# Patient Record
Sex: Female | Born: 1960
Health system: Southern US, Community
[De-identification: ages and names within clinical notes are randomized; demographics above are authoritative.]

## PROBLEM LIST (undated history)

## (undated) DIAGNOSIS — M48061 Spinal stenosis, lumbar region without neurogenic claudication: Secondary | ICD-10-CM

## (undated) DIAGNOSIS — N390 Urinary tract infection, site not specified: Principal | ICD-10-CM

## (undated) DIAGNOSIS — D6959 Other secondary thrombocytopenia: Secondary | ICD-10-CM

## (undated) DIAGNOSIS — Z9109 Other allergy status, other than to drugs and biological substances: Secondary | ICD-10-CM

## (undated) DIAGNOSIS — G894 Chronic pain syndrome: Secondary | ICD-10-CM

## (undated) DIAGNOSIS — R51 Headache: Secondary | ICD-10-CM

## (undated) DIAGNOSIS — M5126 Other intervertebral disc displacement, lumbar region: Secondary | ICD-10-CM

## (undated) DIAGNOSIS — K219 Gastro-esophageal reflux disease without esophagitis: Secondary | ICD-10-CM

## (undated) DIAGNOSIS — F419 Anxiety disorder, unspecified: Secondary | ICD-10-CM

## (undated) DIAGNOSIS — Z923 Personal history of irradiation: Secondary | ICD-10-CM

## (undated) DIAGNOSIS — K0530 Chronic periodontitis, unspecified: Secondary | ICD-10-CM

## (undated) DIAGNOSIS — D701 Agranulocytosis secondary to cancer chemotherapy: Secondary | ICD-10-CM

## (undated) DIAGNOSIS — M5020 Other cervical disc displacement, unspecified cervical region: Secondary | ICD-10-CM

## (undated) DIAGNOSIS — C539 Malignant neoplasm of cervix uteri, unspecified: Secondary | ICD-10-CM

## (undated) DIAGNOSIS — D61810 Antineoplastic chemotherapy induced pancytopenia: Secondary | ICD-10-CM

## (undated) DIAGNOSIS — R519 Headache, unspecified: Secondary | ICD-10-CM

## (undated) DIAGNOSIS — E876 Hypokalemia: Secondary | ICD-10-CM

## (undated) DIAGNOSIS — I10 Essential (primary) hypertension: Secondary | ICD-10-CM

## (undated) DIAGNOSIS — T451X5A Adverse effect of antineoplastic and immunosuppressive drugs, initial encounter: Secondary | ICD-10-CM

## (undated) HISTORY — DX: Personal history of irradiation: Z92.3

## (undated) HISTORY — PX: CARPAL TUNNEL RELEASE: SHX101

## (undated) MED FILL — MAVYRET 100-40 MG TABLETS (SRX): 100-40 MG | 28 days supply | Qty: 84 | Fill #0 | Status: AC

## (undated) MED FILL — MAVYRET 100-40 MG TABLETS (SRX): 100-40 MG | 28 days supply | Qty: 84 | Fill #1 | Status: AC

---

## 1980-03-28 HISTORY — PX: ABCESS DRAINAGE: SHX399

## 2006-06-07 ENCOUNTER — Emergency Department (HOSPITAL_COMMUNITY): Admission: EM | Admit: 2006-06-07 | Discharge: 2006-06-07 | Payer: Self-pay | Admitting: Emergency Medicine

## 2008-03-28 HISTORY — PX: CERVICAL DISC SURGERY: SHX588

## 2012-12-31 LAB — AMB POC DRUG SCREEN (G0434): METHADONE UR POC: POSITIVE

## 2012-12-31 NOTE — Progress Notes (Signed)
New patient. Opioid agreement reviewed with patient and a signed copy has been provided to her. Preliminary UDS (+) methadone. (-) on PMP. Admits to taking a friends methadone and took a dose this morning.   Milinda Cave, LPN  HISTORY OF PRESENT ILLNESS  Erica Zamora is a 52 y.o. female.  HPI Comments: New patient. Referred by Dr. Derrell Lolling.  Initial visit survey reviewed. On the pain diagram, the patient indicates neck pain, posterior shoulder pain. Low back pain.  The least amount of pain, 4/10. Greatest-10. Average-5.  I reviewed the list of medications which includes Percocet, methadone. The patient admits that she was discharged from a recent pain clinic, Dr. Ananias Pilgrim.it is my understanding that she had a urine positive for methadone, they were not prescribing that medication for her. I believe that many months ago that pain clinic had been prescribing Percocet for the patient. She was discharged from the clinic. She reports that she continues to use methadone, purchasing it from a friend.  She reports that methadone does help her pain and she is hopeful that a pain clinic will prescribe this for her.  -chief complaint, and neck pain. She does also have some low back pain. The most significant pain is in the neck pain and today's visit we'll concentrate on her neck pain.  She reports a history of neck surgery several years ago. She reports that in neck pain is deep, achy, sharp. Neck pain for several years. Intermittent symptoms into her left arm, down to the forearm. Neck pain increases with certain neck movements. Pain is present during the day and night, almost constant.  She reports no improvement with muscular trigger point injections for her neck pain. I do not believe she has had cervical spine injections.  She has not worked with physical therapy, she states due to cost.  She has tried Percocet, gabapentin, Flexeril, Vicodin, methadone, tramadol, nonsteroidal anti-inflammatory drugs.she tried  Cymbalta, samples, reports side effects.  She use of methadone this morning.  She reports a history of anxiety and has been on medication for this in the past.  She has been under the care of psychiatry/psychology and reports she plans on setting up a followup.  -she reports, use of a Percocet yesterday. She states the medication the previous pain clinic was prescribing was not strong enough.              -radiologist's impression, MRI of the cervical spine, in May of this year: Status post ACDF at C5-6 with satisfactory overall appearance. Increasing disc degenerative changes C4-5.  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 Further testing not performed secondary  To complaints of pain.  Prescription monitoring program reviewed.--6 different prescribers. 5 different pharmacies. Several prescriptions for hydrocodone and Percocet.  The patient was educated on the Tylenol component of certain pain medications. The patient was reminded, they should keep track of total Tylenol intakeand make sure liver function tests have been checked with primary care physician.    -opioid risk tool has been reviewed, and will be scanned. Total score--1    -The available medical record/information has been reviewed including notes from the referring physician's office. Medical information available for review includes records from the [facility] neurosurgery clinic and progress notes from Dr. Ananias Pilgrim.  Neurosurgery clinic saw the patient fairly recently and did not feel further surgery was indicated.  She has received trigger point injections and I believe others were not performed because she was complaining of  pain from the trigger point injections. Medical information indicates there have been complaints of side effects or itching from Tylenol with codeine, fentanyl patch, tramadol.  Medical information indicates there have been prescriptions for gabapentin, lorazepam, Ambien, Cymbalta,butrans  patch.  The previous pain clinic also provided the following statement in a progress note-"I won't give narcotics to cover her emotional suffering, poor coping and unreliability."    -New patient survey reviewed and will be scanned.Please see this form for more information concerning PMH,FH,SH, and ROS.  The majority of today's visit was spent counseling and coordinating care.Total visit time was greater than 60 minutes.        - Preliminary urine screen reviewed.       - Additional time was spent reviewing medical records,interpreting data and educating the patient.                              -Discussing Dxes,condition and treatment options,possible side effects/risks/complications.       Review of Systems   Constitutional: Positive for weight loss and malaise/fatigue.   HENT: Positive for neck pain. Negative for sore throat.    Eyes: Negative for blurred vision and double vision.   Respiratory: Negative for cough and shortness of breath.    Cardiovascular: Negative for chest pain and leg swelling.   Gastrointestinal: Positive for heartburn. Negative for nausea.   Genitourinary: Negative for dysuria and urgency.   Musculoskeletal: Positive for back pain and joint pain.   Skin: Negative for itching and rash.   Neurological: Positive for dizziness and headaches. Negative for seizures.   Endo/Heme/Allergies: Negative for environmental allergies. Does not bruise/bleed easily.   Psychiatric/Behavioral: Positive for depression. Negative for suicidal ideas. The patient is nervous/anxious and has insomnia.        Physical Exam   Nursing note and vitals reviewed.  Constitutional: She appears well-developed and well-nourished. She is cooperative. She does not have a sickly appearance.   HENT:   Head: Normocephalic and atraumatic.   Right Ear: External ear normal. No drainage.   Left Ear: External ear normal. No drainage.   Nose: Nose normal.   Mouth/Throat: No oropharyngeal exudate.   Eyes: Lids are normal. Right eye  exhibits no discharge. Left eye exhibits no discharge. Right conjunctiva has no hemorrhage. Left conjunctiva has no hemorrhage. Pupils are equal.   Neck: Neck supple. No tracheal deviation present. No mass present.   Cardiovascular: Normal rate and regular rhythm.    No murmur heard.  Pulmonary/Chest: Effort normal and breath sounds normal. No respiratory distress.   Musculoskeletal:        Cervical back: She exhibits decreased range of motion and tenderness. She exhibits no spasm.   Neurological: She is alert. She has normal reflexes. No cranial nerve deficit.   Slow, stiff gait. Grossly normal strength, light touch sensation, deep tendon reflexes upper extremities. There are triggerpoints to cervical paraspinal muscles.   Skin: Skin is intact. No abrasion and no rash noted. She is not diaphoretic. No cyanosis.   Psychiatric: Her speech is normal and behavior is normal. Judgment and thought content normal. Her mood appears anxious. Her affect is not angry. She does not express inappropriate judgment. She does not exhibit a depressed mood.       ASSESSMENT and PLAN  Encounter Diagnoses   Name Primary?   ??? Neck pain Yes   ??? Encounter for long-term (current) use of other medications    ???  Chronic pain syndrome    ??? GAD (generalized anxiety disorder)    ??? DJD (degenerative joint disease), cervical    ??? History of cervical spinal surgery    followup in one month  At this time I am not comfortable prescribing any medication or treatment for the patient. At this time I am not comfortable  Taking over direct patient care.  I have encouraged the patient to stop using pain medication not prescribed to her, such as purchasing pain medication from friends.  I recommend that she stop this practice and return in a month. Consider taking over her care including prescriptions if her urine is clean.  I have encouraged the patient to followup with psychiatry.  If she does become involved with our clinic then I would recommend  retrying trigger point injections. I would recommend physical therapy, she states she is never worked with physical therapy.  -Pain is a complex sensory and emotional experience intimately related to the concept of suffering and the life experiences and psychological makeup of the individual. There is a host of psychosocial issues,including depression,anxiety,fear,altered social roles,and loss of activities which have a strong scientific basis for contributing to the chronic pain state. The effective treatment of chronic pain involves understanding the individual with pain. Issues of fear avoidance,catastrophizing,belief systems about exercise and injury,sleep disturbance,family dynamics,and other factors may play a role in the patient's experience of pain.

## 2013-05-14 ENCOUNTER — Emergency Department (HOSPITAL_COMMUNITY)
Admission: EM | Admit: 2013-05-14 | Discharge: 2013-05-14 | Disposition: A | Discharge status: Home / Self Care | Payer: Self-pay | Attending: Emergency Medicine | Admitting: Emergency Medicine

## 2013-05-14 ENCOUNTER — Encounter (HOSPITAL_COMMUNITY): Payer: Self-pay | Admitting: Emergency Medicine

## 2013-05-14 DIAGNOSIS — K029 Dental caries, unspecified: Secondary | ICD-10-CM | POA: Insufficient documentation

## 2013-05-14 DIAGNOSIS — F17200 Nicotine dependence, unspecified, uncomplicated: Secondary | ICD-10-CM | POA: Insufficient documentation

## 2013-05-14 DIAGNOSIS — Z79899 Other long term (current) drug therapy: Secondary | ICD-10-CM | POA: Insufficient documentation

## 2013-05-14 DIAGNOSIS — K219 Gastro-esophageal reflux disease without esophagitis: Secondary | ICD-10-CM | POA: Insufficient documentation

## 2013-05-14 DIAGNOSIS — K089 Disorder of teeth and supporting structures, unspecified: Secondary | ICD-10-CM | POA: Insufficient documentation

## 2013-05-14 DIAGNOSIS — I10 Essential (primary) hypertension: Secondary | ICD-10-CM | POA: Insufficient documentation

## 2013-05-14 DIAGNOSIS — K0889 Other specified disorders of teeth and supporting structures: Secondary | ICD-10-CM

## 2013-05-14 HISTORY — DX: Essential (primary) hypertension: I10

## 2013-05-14 MED ORDER — OXYCODONE-ACETAMINOPHEN 5-325 MG PO TABS
2.0000 | ORAL_TABLET | Freq: Once | ORAL | Status: AC
  Administered 2013-05-14: 2 via ORAL
  Filled 2013-05-14: qty 2

## 2013-05-14 MED ORDER — OXYCODONE-ACETAMINOPHEN 5-325 MG PO TABS: 1.0000 | ORAL_TABLET | Freq: Four times a day (QID) | ORAL | Status: DC | PRN

## 2013-05-14 MED ORDER — AMOXICILLIN 500 MG PO CAPS: 500.0000 mg | ORAL_CAPSULE | Freq: Three times a day (TID) | ORAL | Status: DC

## 2013-05-14 MED ORDER — AMOXICILLIN 500 MG PO CAPS
500.0000 mg | ORAL_CAPSULE | Freq: Once | ORAL | Status: AC
  Administered 2013-05-14: 500 mg via ORAL
  Filled 2013-05-14: qty 1

## 2013-05-14 NOTE — Discharge Instructions (Signed)

## 2013-05-14 NOTE — ED Provider Notes (Signed)
CSN: [id number]     Arrival date & time 05/14/13  1940 History   First MD Initiated Contact with Patient 05/14/13 1954     Chief Complaint  Patient presents with  . Dental Pain     (Consider location/radiation/quality/duration/timing/severity/associated sxs/prior Treatment) HPI  Patient presents to the emergency department with a dental complaint. Symptoms began this past wednesdsay The patient has tried to alleviate pain with OTC medication.  Pain rated at a 10/10, characterized as throbbing in nature and located left upper molar.. Patient denies fever, night sweats, chills, difficulty swallowing or opening mouth, SOB, nuchal rigidity or decreased ROM of neck.  Patient does not have a dentist and requests a resource guide at discharge.  Was supposed to get tooth taken out a few days ago but she is here from out of town because her daughter is having a baby.  Past Medical History  Diagnosis Date  . Hypertension   . Acid reflux    Past Surgical History  Procedure Laterality Date  . Cervical disc surgery     History reviewed. No pertinent family history. History  Substance Use Topics  . Smoking status: Current Every Day Smoker  . Smokeless tobacco: Not on file  . Alcohol Use: No   OB History   Grav Para Term Preterm Abortions TAB SAB Ect Mult Living                 Review of Systems  The patient denies anorexia, fever, weight loss,, vision loss, decreased hearing, hoarseness, chest pain, syncope, dyspnea on exertion, peripheral edema, balance deficits, hemoptysis, abdominal pain, melena, hematochezia, severe indigestion/heartburn, hematuria, incontinence, genital sores, muscle weakness, suspicious skin lesions, transient blindness, difficulty walking, depression, unusual weight change, abnormal bleeding, enlarged lymph nodes, angioedema, and breast masses.   Allergies  Fentanyl; Ibuprofen; Tramadol; and Tylenol  Home Medications   Current Outpatient Rx  Name  Route  Sig   Dispense  Refill  . Aspirin-Salicylamide-Caffeine (BC HEADACHE POWDER PO)   Oral   Take 1 packet by mouth daily as needed (for pain).         Marland Kitchen lisinopril-hydrochlorothiazide (PRINZIDE,ZESTORETIC) 10-12.5 MG per tablet   Oral   Take 1 tablet by mouth daily.         Marland Kitchen omeprazole (PRILOSEC) 20 MG capsule   Oral   Take 20 mg by mouth daily.         . pseudoephedrine (SUDAFED) 30 MG tablet   Oral   Take 30 mg by mouth every 4 (four) hours as needed for congestion.         Marland Kitchen amoxicillin (AMOXIL) 500 MG capsule   Oral   Take 1 capsule (500 mg total) by mouth 3 (three) times daily.   21 capsule   0   . oxyCODONE-acetaminophen (PERCOCET/ROXICET) 5-325 MG per tablet   Oral   Take 1-2 tablets by mouth every 6 (six) hours as needed for severe pain.   15 tablet   0    BP 146/76  Pulse 88  Temp(Src) 97.6 F (36.4 C)  Resp 18  Ht 5\' 1"  (1.549 m)  Wt 150 lb (68.04 kg)  BMI 28.36 kg/m2  SpO2 100% Physical Exam  Nursing note and vitals reviewed. Constitutional: She appears well-developed and well-nourished. No distress.  HENT:  Head: Normocephalic and atraumatic.  Mouth/Throat: Uvula is midline, oropharynx is clear and moist and mucous membranes are normal. Normal dentition. Dental caries (Pts tooth shows no obvious abscess but moderate to severe tenderness  to palpation of marked tooth) present. No uvula swelling.    Eyes: Pupils are equal, round, and reactive to light.  Neck: Trachea normal, normal range of motion and full passive range of motion without pain. Neck supple.  Cardiovascular: Normal rate, regular rhythm, normal heart sounds and normal pulses.   Pulmonary/Chest: Effort normal and breath sounds normal. No respiratory distress. Chest wall is not dull to percussion. She exhibits no tenderness, no crepitus, no edema, no deformity and no retraction.  Abdominal: Soft. Normal appearance.  Musculoskeletal: Normal range of motion.  Neurological: She is alert. She has  normal strength.  Skin: Skin is warm, dry and intact. She is not diaphoretic.  Psychiatric: She has a normal mood and affect. Her speech is normal. Cognition and memory are normal.    ED Course  Procedures (including critical care time) Labs Review Labs Reviewed - No data to display Imaging Review No results found.  EKG Interpretation   None       MDM   Final diagnoses:  Pain, dental   Offered dental block but declined.  Patient has dental pain. No emergent s/sx's present. Patent airway. No trismus.  Will be given pain medication and antibiotics. I discussed the need to call dentist within 24/48 hours for follow-up. Dental referral given. Return to ED precautions given.  Pt voiced understanding and has agreed to follow-up.   53 y.o.Valerie Kerr's evaluation in the Emergency Department is complete. It has been determined that no acute conditions requiring further emergency intervention are present at this time. The patient/guardian have been advised of the diagnosis and plan. We have discussed signs and symptoms that warrant return to the ED, such as changes or worsening in symptoms.  Vital signs are stable at discharge. Filed Vitals:   05/14/13 1947  BP: 146/76  Pulse: 88  Temp: 97.6 F (36.4 C)  Resp: 18    Patient/guardian has voiced understanding and agreed to follow-up with the PCP or specialist.     Linus Mako, PA-C 05/14/13 2050

## 2013-05-14 NOTE — ED Notes (Signed)
Pt had appt with dentist for tooth removal; in town helping daughter who had a baby on Wedns. Pt has mild facial swelling and pain in upper left side of mouth. 10/10.

## 2013-05-14 NOTE — ED Notes (Signed)
Pt having pain in the left lower mouth because of dental issues.

## 2013-05-14 NOTE — ED Notes (Signed)
PA at bedside.

## 2013-05-15 NOTE — ED Provider Notes (Signed)
Medical screening examination/treatment/procedure(s) were performed by non-physician practitioner and as supervising physician I was immediately available for consultation/collaboration.  Yukie Bergeron L Eleanor Gatliff, MD 05/15/13 0000 

## 2014-05-05 ENCOUNTER — Emergency Department (HOSPITAL_COMMUNITY)
Admission: EM | Admit: 2014-05-05 | Discharge: 2014-05-05 | Disposition: A | Discharge status: Home / Self Care | Payer: Self-pay | Attending: Emergency Medicine | Admitting: Emergency Medicine

## 2014-05-05 ENCOUNTER — Encounter (HOSPITAL_COMMUNITY): Payer: Self-pay | Admitting: Emergency Medicine

## 2014-05-05 DIAGNOSIS — Z792 Long term (current) use of antibiotics: Secondary | ICD-10-CM | POA: Insufficient documentation

## 2014-05-05 DIAGNOSIS — Z720 Tobacco use: Secondary | ICD-10-CM | POA: Insufficient documentation

## 2014-05-05 DIAGNOSIS — M545 Low back pain: Secondary | ICD-10-CM

## 2014-05-05 DIAGNOSIS — I10 Essential (primary) hypertension: Secondary | ICD-10-CM | POA: Insufficient documentation

## 2014-05-05 DIAGNOSIS — M5440 Lumbago with sciatica, unspecified side: Secondary | ICD-10-CM | POA: Insufficient documentation

## 2014-05-05 DIAGNOSIS — K219 Gastro-esophageal reflux disease without esophagitis: Secondary | ICD-10-CM | POA: Insufficient documentation

## 2014-05-05 DIAGNOSIS — Z79899 Other long term (current) drug therapy: Secondary | ICD-10-CM | POA: Insufficient documentation

## 2014-05-05 DIAGNOSIS — J069 Acute upper respiratory infection, unspecified: Secondary | ICD-10-CM | POA: Insufficient documentation

## 2014-05-05 MED ORDER — OXYCODONE-ACETAMINOPHEN 5-325 MG PO TABS
1.0000 | ORAL_TABLET | Freq: Once | ORAL | Status: AC
  Administered 2014-05-05: 1 via ORAL
  Filled 2014-05-05: qty 1

## 2014-05-05 MED ORDER — OXYCODONE-ACETAMINOPHEN 5-325 MG PO TABS: 1.0000 | ORAL_TABLET | ORAL | Status: DC | PRN

## 2014-05-05 MED ORDER — CETIRIZINE HCL 10 MG PO CAPS: 1.0000 | ORAL_CAPSULE | Freq: Every day | ORAL | Status: DC

## 2014-05-05 NOTE — Discharge Instructions (Signed)
Return to the emergency room with worsening of symptoms, new symptoms or with symptoms that are concerning , especially fevers, loss of control of bladder or bowels, numbness or tingling around genital region or anus, weakness OR , especially fevers, stiff neck, worsening headache, nausea/vomiting, visual changes or slurred speech, chest pain, shortness of breath, cough with thick colored mucous or blood Drink plenty of fluids with electrolytes especially Gatorade. OTC cold medications such as mucinex, nyquil, dayquil are recommended. Chloraseptic for sore throat. RICE: Rest, Ice (three cycles of 20 mins on, 32mns off at least twice a day), compression/brace, elevation. Heating pad works well for back pain. Follow up with PCP/orthopedist if symptoms worsen or are persistent.   Back Injury Prevention Back injuries can be extremely painful and difficult to heal. After having one back injury, you are much more likely to experience another later on. It is important to learn how to avoid injuring or re-injuring your back. The following tips can help you to prevent a back injury. PHYSICAL FITNESS  Exercise regularly and try to develop good tone in your abdominal muscles. Your abdominal muscles provide a lot of the support needed by your back.  Do aerobic exercises (walking, jogging, biking, swimming) regularly.  Do exercises that increase balance and strength (tai chi, yoga) regularly. This can decrease your risk of falling and injuring your back.  Stretch before and after exercising.  Maintain a healthy weight. The more you weigh, the more stress is placed on your back. For every pound of weight, 10 times that amount of pressure is placed on the back. DIET  Talk to your caregiver about how much calcium and vitamin D you need per day. These nutrients help to prevent weakening of the bones (osteoporosis). Osteoporosis can cause broken (fractured) bones that lead to back pain.  Include good sources  of calcium in your diet, such as dairy products, green, leafy vegetables, and products with calcium added (fortified).  Include good sources of vitamin D in your diet, such as milk and foods that are fortified with vitamin D.  Consider taking a nutritional supplement or a multivitamin if needed.  Stop smoking if you smoke. POSTURE  Sit and stand up straight. Avoid leaning forward when you sit or hunching over when you stand.  Choose chairs with good low back (lumbar) support.  If you work at a desk, sit close to your work so you do not need to lean over. Keep your chin tucked in. Keep your neck drawn back and elbows bent at a right angle. Your arms should look like the letter "L."  Sit high and close to the steering wheel when you drive. Add a lumbar support to your car seat if needed.  Avoid sitting or standing in one position for too long. Take breaks to get up, stretch, and walk around at least once every hour. Take breaks if you are driving for long periods of time.  Sleep on your side with your knees slightly bent, or sleep on your back with a pillow under your knees. Do not sleep on your stomach. LIFTING, TWISTING, AND REACHING  Avoid heavy lifting, especially repetitive lifting. If you must do heavy lifting:  Stretch before lifting.  Work slowly.  Rest between lifts.  Use carts and dollies to move objects when possible.  Make several small trips instead of carrying 1 heavy load.  Ask for help when you need it.  Ask for help when moving big, awkward objects.  Follow these steps when  lifting:  Stand with your feet shoulder-width apart.  Get as close to the object as you can. Do not try to pick up heavy objects that are far from your body.  Use handles or lifting straps if they are available.  Bend at your knees. Squat down, but keep your heels off the floor.  Keep your shoulders pulled back, your chin tucked in, and your back straight.  Lift the object slowly,  tightening the muscles in your legs, abdomen, and buttocks. Keep the object as close to the center of your body as possible.  When you put a load down, use these same guidelines in reverse.  Do not:  Lift the object above your waist.  Twist at the waist while lifting or carrying a load. Move your feet if you need to turn, not your waist.  Bend over without bending at your knees.  Avoid reaching over your head, across a table, or for an object on a high surface. OTHER TIPS  Avoid wet floors and keep sidewalks clear of ice to prevent falls.  Do not sleep on a mattress that is too soft or too hard.  Keep items that are used frequently within easy reach.  Put heavier objects on shelves at waist level and lighter objects on lower or higher shelves.  Find ways to decrease your stress, such as exercise, massage, or relaxation techniques. Stress can build up in your muscles. Tense muscles are more vulnerable to injury.  Seek treatment for depression or anxiety if needed. These conditions can increase your risk of developing back pain. SEEK MEDICAL CARE IF:  You injure your back.  You have questions about diet, exercise, or other ways to prevent back injuries. MAKE SURE YOU:  Understand these instructions.  Will watch your condition.  Will get help right away if you are not doing well or get worse. Document Released: 04/21/2004 Document Revised: 06/06/2011 Document Reviewed: 04/25/2011 [facility] Patient Information 2015 [place], [place]. This information is not intended to replace advice given to you by your health care provider. Make sure you discuss any questions you have with your health care provider.

## 2014-05-05 NOTE — ED Provider Notes (Signed)
CSN: [id number]     Arrival date & time 05/05/14  1240 History  This chart was scribed for non-physician practitioner, Pura Spice, PA-C, working with Leota Jacobsen, MD, by Ian Bushman, ED Scribe. This patient was seen in room TR09C/TR09C and the patient's care was started at 1:55 PM.    Chief Complaint  Patient presents with  . Back Pain  . URI     (Consider location/radiation/quality/duration/timing/severity/associated sxs/prior Treatment) Patient is a 54 y.o. female presenting with back pain and URI. The history is provided by the patient. No language interpreter was used.  Back Pain Associated symptoms: no abdominal pain, no dysuria and no fever   URI Presenting symptoms: congestion and cough   Presenting symptoms: no fever and no rhinorrhea     HPI Comments: Valerie Kerr is a 54 y.o. female who presents to the Emergency Department complaining of intermittent lower back pain radiating (to buttocks) and feels like a squeezing/pressure. Patient notes that she has not had a shot over a year and thinks that her Sciatica may be acting up due to using the stairs at her daughters house. She states that she was not able to get up today due to her pain.  She is in the process of finding a doctor for her back.  Patient has taken tylenol but notes of very little relief. Denies abdominal pain, dysuria, bowel inconstinence, genital weakness/ numbness.   Sinus Congestion: Patient also notes of a sinus congestion for the last couple of days.  Patient has associated symptoms of intermittent fever and chills.   Patient has not been able to take any medication to alleviate her symptoms due to financial reasons.Notes she has a little cough and stuffiness but no drainage.  Patient has high blood pressure.             Past Medical History  Diagnosis Date  . Hypertension   . Acid reflux    Past Surgical History  Procedure Laterality Date  . Cervical disc surgery     History  reviewed. No pertinent family history. History  Substance Use Topics  . Smoking status: Current Every Day Smoker  . Smokeless tobacco: Not on file  . Alcohol Use: No   OB History    No data available     Review of Systems  Constitutional: Negative for fever and chills.  HENT: Positive for congestion. Negative for rhinorrhea.   Respiratory: Positive for cough.   Gastrointestinal: Negative for abdominal pain.  Genitourinary: Negative for dysuria, urgency and frequency.  Musculoskeletal: Positive for back pain.      Allergies  Fentanyl; Ibuprofen; Tramadol; and Tylenol  Home Medications   Prior to Admission medications   Medication Sig Start Date End Date Taking? Authorizing Provider  amoxicillin (AMOXIL) 500 MG capsule Take 1 capsule (500 mg total) by mouth 3 (three) times daily. 05/14/13   Tiffany Marilu Favre, PA-C  Aspirin-Salicylamide-Caffeine (BC HEADACHE POWDER PO) Take 1 packet by mouth daily as needed (for pain).    Historical Provider, MD  Cetirizine HCl (ZYRTEC ALLERGY) 10 MG CAPS Take 1 capsule (10 mg total) by mouth daily. 05/05/14   Pura Spice, PA-C  lisinopril-hydrochlorothiazide (PRINZIDE,ZESTORETIC) 10-12.5 MG per tablet Take 1 tablet by mouth daily.    Historical Provider, MD  omeprazole (PRILOSEC) 20 MG capsule Take 20 mg by mouth daily.    Historical Provider, MD  oxyCODONE-acetaminophen (PERCOCET/ROXICET) 5-325 MG per tablet Take 1-2 tablets by mouth every 4 (four) hours as needed for moderate  pain or severe pain. 05/05/14   Pura Spice, PA-C  pseudoephedrine (SUDAFED) 30 MG tablet Take 30 mg by mouth every 4 (four) hours as needed for congestion.    Historical Provider, MD   BP 131/64 mmHg  Pulse 90  Temp(Src) 98.3 F (36.8 C) (Oral)  Ht 5\' 1"  (1.549 m)  Wt 156 lb (70.761 kg)  BMI 29.49 kg/m2  SpO2 93% Physical Exam  Constitutional: She appears well-developed and well-nourished. No distress.  HENT:  Head: Normocephalic and atraumatic.  No  orapharynx erythema or swelling.    Eyes: Conjunctivae are normal. Right eye exhibits no discharge. Left eye exhibits no discharge.  Cardiovascular: Normal rate, regular rhythm and normal heart sounds.   Pulmonary/Chest: Effort normal and breath sounds normal. No respiratory distress. She has no wheezes.  Abdominal: Soft. Bowel sounds are normal. She exhibits no distension. There is no tenderness.  Musculoskeletal:  No midline back tenderness, step off or crepitus. Right and Left sided lower back tenderness. No CVA tenderness.  Lymphadenopathy:    She has cervical adenopathy.  Neurological: She is alert. Coordination normal.  Equal muscle tone. 5/5 strength in lower extremities. DTR equal and intact. Positive right straight leg test. Negative on left Antalgic gait.   Skin: Skin is warm and dry. She is not diaphoretic.  Nursing note and vitals reviewed.   ED Course  Procedures (including critical care time) DIAGNOSTIC STUDIES: Oxygen Saturation is 93% on RA, low by my interpretation.    COORDINATION OF CARE: 2:05 PM Discussed treatment plan with patient at beside, the patient agrees with the plan and has no further questions at this time.   Labs Review Labs Reviewed - No data to display  Imaging Review No results found.   EKG Interpretation None      MDM   Final diagnoses:  URI (upper respiratory infection)  Bilateral low back pain, with sciatica presence unspecified   Patient with back pain. No loss of bowel or bladder control. No saddle anesthesia. No fever, night sweats, weight loss, h/o cancer, IVDU. VSS. No neurological deficits and normal neuro exam. Patient can walk but states is painful. No concern for cauda equina.  RICE protocol and pain medicine indicated and discussed with patient. Driving and sedation precautions provided.  Follow up with orthopedics for persistent or worsening symptoms.   Pt also with symptoms consistent with URI, likely viral etiology.  Discussed that antibiotics are not indicated for viral infections. Pt will be discharged with symptomatic treatment.  Verbalizes understanding and is agreeable with plan. Pt is hemodynamically stable & in NAD prior to dc. Patient without primary care provider. She has been referred to the wellness center to establish care.  Discussed return precautions with patient. Discussed all results and patient verbalizes understanding and agrees with plan.  I personally performed the services described in this documentation, which was scribed in my presence. The recorded information has been reviewed and is accurate.   Pura Spice, PA-C 05/05/14 1707  Leota Jacobsen, MD 05/06/14 [phone]

## 2014-05-05 NOTE — ED Notes (Signed)
Declined W/C at D/C and was escorted to lobby by RN.Declined W/C at D/C and was escorted to lobby by RN. 

## 2014-05-05 NOTE — ED Notes (Signed)
Pt c/o lower back pain that is chronic in nature with sciatica and pain on right side; pt sts some URI sx with congestion as well

## 2014-06-17 ENCOUNTER — Emergency Department (HOSPITAL_COMMUNITY)
Admission: EM | Admit: 2014-06-17 | Discharge: 2014-06-17 | Disposition: A | Discharge status: Home / Self Care | Payer: Self-pay | Attending: Emergency Medicine | Admitting: Emergency Medicine

## 2014-06-17 ENCOUNTER — Encounter (HOSPITAL_COMMUNITY): Payer: Self-pay | Admitting: *Deleted

## 2014-06-17 DIAGNOSIS — I10 Essential (primary) hypertension: Secondary | ICD-10-CM | POA: Insufficient documentation

## 2014-06-17 DIAGNOSIS — K029 Dental caries, unspecified: Secondary | ICD-10-CM | POA: Insufficient documentation

## 2014-06-17 DIAGNOSIS — Z720 Tobacco use: Secondary | ICD-10-CM | POA: Insufficient documentation

## 2014-06-17 DIAGNOSIS — Z79899 Other long term (current) drug therapy: Secondary | ICD-10-CM | POA: Insufficient documentation

## 2014-06-17 DIAGNOSIS — K219 Gastro-esophageal reflux disease without esophagitis: Secondary | ICD-10-CM | POA: Insufficient documentation

## 2014-06-17 DIAGNOSIS — K002 Abnormalities of size and form of teeth: Secondary | ICD-10-CM | POA: Insufficient documentation

## 2014-06-17 DIAGNOSIS — Z792 Long term (current) use of antibiotics: Secondary | ICD-10-CM | POA: Insufficient documentation

## 2014-06-17 DIAGNOSIS — R221 Localized swelling, mass and lump, neck: Secondary | ICD-10-CM | POA: Insufficient documentation

## 2014-06-17 MED ORDER — PENICILLIN V POTASSIUM 500 MG PO TABS: 500.0000 mg | ORAL_TABLET | Freq: Three times a day (TID) | ORAL | Status: DC

## 2014-06-17 MED ORDER — PENICILLIN V POTASSIUM 250 MG PO TABS: 500.0000 mg | ORAL_TABLET | Freq: Once | ORAL | Status: DC

## 2014-06-17 MED ORDER — OXYCODONE-ACETAMINOPHEN 5-325 MG PO TABS: 1.0000 | ORAL_TABLET | Freq: Four times a day (QID) | ORAL | Status: DC | PRN

## 2014-06-17 MED ORDER — PENICILLIN V POTASSIUM 250 MG PO TABS
500.0000 mg | ORAL_TABLET | Freq: Once | ORAL | Status: AC
  Administered 2014-06-17: 500 mg via ORAL
  Filled 2014-06-17: qty 2

## 2014-06-17 NOTE — ED Notes (Signed)
Pt reports 2 day swelling to left side of face. Denies any dental pain. Patient states the swelling fluctuates. No fever. Denies sore throat.

## 2014-06-17 NOTE — Discharge Instructions (Signed)
Please read and follow all provided instructions.  Your diagnoses today include:  1. Neck swelling    The exam and treatment you received today has been provided on an emergency basis only. This is not a substitute for complete medical or dental care.  Tests performed today include:  Vital signs. See below for your results today.   Medications prescribed:   Penicillin - antibiotic  You have been prescribed an antibiotic medicine: take the entire course of medicine even if you are feeling better. Stopping early can cause the antibiotic not to work.   Percocet (oxycodone/acetaminophen) - narcotic pain medication  DO NOT drive or perform any activities that require you to be awake and alert because this medicine can make you drowsy. BE VERY CAREFUL not to take multiple medicines containing Tylenol (also called acetaminophen). Doing so can lead to an overdose which can damage your liver and cause liver failure and possibly death.  Take any prescribed medications only as directed.  Home care instructions:  Follow any educational materials contained in this packet.  Follow-up instructions: Please follow-up with your dentist for further evaluation of your symptoms.   Dental Assistance: See below for dental referrals  Return instructions:   Please return to the Emergency Department if you experience worsening symptoms.  Please return if you develop a fever, you develop more swelling in your face or neck, you have trouble breathing or swallowing food.  Please return if you have any other emergent concerns.  Additional Information:  Your vital signs today were: BP 131/76 mmHg   Pulse 91   Temp(Src) 98.2 F (36.8 C) (Oral)   Resp 16   Ht 5\' 1"  (1.549 m)   Wt 154 lb 9.6 oz (70.126 kg)   BMI 29.23 kg/m2   SpO2 96% If your blood pressure (BP) was elevated above 135/85 this visit, please have this repeated by your doctor within one month. -------------- Dental Care: Organization          Address  Phone  Notes  [facility] Department of [facility] [phone] Accepts children up to age 22 who are enrolled in [place] or [place]; pregnant women with a Medicaid card; and children who have applied for Medicaid or [place] Health Choice, but were declined, whose parents can pay a reduced fee at time of service.  [facility] Department of [facility]  [address], [place] [phone] Accepts children up to age 40 who are enrolled in [place] or [place]; pregnant women with a Medicaid card; and children who have applied for Medicaid or [place] Health Choice, but were declined, whose parents can pay a reduced fee at time of service.  [facility]  [place] [phone] Patients are seen by appointment only. Walk-ins are not accepted. [place] will see patients 82 years of age and older. Monday - Tuesday (8am-5pm) Most Wednesdays (8:30-5pm) $30 per visit, cash only  [facility]  [address], [facility] [phone] Patients are seen by appointment only. Walk-ins are not accepted. [place] will see patients 33 years of age and older. One Wednesday Evening (Monthly: Volunteer Based).  $30 per visit, cash only  [place]  [phone] for adults; Children under age 34, call Graduate Pediatric Dentistry at [phone]. Children aged 98-14, please call [phone] to request a  pediatric application.  Dental services are provided in all areas of dental care including fillings, crowns and bridges, complete and partial dentures, implants, gum treatment, root canals, and extractions. Preventive care is also provided. Treatment is provided to both adults and children. Patients are selected via a lottery and there is often a waiting list.   [facility] [address] [place]  [phone] [email or web address]   [facility] [address] [place], [place] [phone], Ext. [phone] Second and Fourth Thursday of each month, opens at 6:30 AM; Clinic ends at 9 AM.  Patients are seen on a first-come first-served basis, and a limited number are seen during each clinic.   [facility]  [address] [place], [place] [phone]   Eligibility Requirements You must have lived in [place], [place], or [place] counties for at least the last three months.   You cannot be eligible for state or federal sponsored [employer], including [employer], [place], or [employer].   You generally cannot be eligible for healthcare insurance through your employer.    How to apply: Eligibility screenings are held every Tuesday and Wednesday afternoon from 1:00 pm until 4:00 pm. You do not need an appointment for the interview!  [facility] [address], [place], [place]   [place]  [place]  [place]  [phone]

## 2014-06-17 NOTE — ED Provider Notes (Signed)
CSN: [id number]     Arrival date & time 06/17/14  2113 History  This chart was scribed for Valerie Cater, PA-C working with Valerie Reichert, MD by Randa Evens, ED Scribe. This patient was seen in room TR06C/TR06C and the patient's care was started at 9:43 PM.     Chief Complaint  Patient presents with  . Facial Swelling   The history is provided by the patient. No language interpreter was used.   HPI Comments: Valerie Kerr is a 54 y.o. female who presents to the Emergency Department complaining of new right sided facial swelling that has moved to left side that began 4 days ago. Pt states she has had dental pain as well. Pt states she needs to have several teeth removed and believes that this is the source of her pain and swelling. Pt doesn't report any alleviating factors. Pt states that the pain is worse at night. Pt denies any medications PTA. Pt states that it is painful to swallow. No difficulty swallowing. Pt denies fever, SOB, trouble swallowing or sore throat.    Past Medical History  Diagnosis Date  . Hypertension   . Acid reflux    Past Surgical History  Procedure Laterality Date  . Cervical disc surgery     History reviewed. No pertinent family history. History  Substance Use Topics  . Smoking status: Current Every Day Smoker  . Smokeless tobacco: Not on file  . Alcohol Use: No   OB History    No data available     Review of Systems  Constitutional: Negative for fever.  HENT: Positive for dental problem and facial swelling. Negative for ear pain, sore throat and trouble swallowing.   Respiratory: Negative for shortness of breath and stridor.   Musculoskeletal: Negative for neck pain.  Skin: Negative for color change.  Neurological: Negative for headaches.    Allergies  Fentanyl; Ibuprofen; Tramadol; and Tylenol  Home Medications   Prior to Admission medications   Medication Sig Start Date End Date Taking? Authorizing Provider  amoxicillin (AMOXIL) 500  MG capsule Take 1 capsule (500 mg total) by mouth 3 (three) times daily. 05/14/13   Tiffany Carlota Raspberry, PA-C  Aspirin-Salicylamide-Caffeine (BC HEADACHE POWDER PO) Take 1 packet by mouth daily as needed (for pain).    Historical Provider, MD  Cetirizine HCl (ZYRTEC ALLERGY) 10 MG CAPS Take 1 capsule (10 mg total) by mouth daily. 05/05/14   Al Corpus, PA-C  lisinopril-hydrochlorothiazide (PRINZIDE,ZESTORETIC) 10-12.5 MG per tablet Take 1 tablet by mouth daily.    Historical Provider, MD  omeprazole (PRILOSEC) 20 MG capsule Take 20 mg by mouth daily.    Historical Provider, MD  oxyCODONE-acetaminophen (PERCOCET/ROXICET) 5-325 MG per tablet Take 1-2 tablets by mouth every 4 (four) hours as needed for moderate pain or severe pain. 05/05/14   Al Corpus, PA-C  pseudoephedrine (SUDAFED) 30 MG tablet Take 30 mg by mouth every 4 (four) hours as needed for congestion.    Historical Provider, MD   BP 131/76 mmHg  Pulse 91  Temp(Src) 98.2 F (36.8 C) (Oral)  Resp 16  Ht 5\' 1"  (1.549 m)  Wt 154 lb 9.6 oz (70.126 kg)  BMI 29.23 kg/m2  SpO2 96%   Physical Exam  Constitutional: She is oriented to person, place, and time. She appears well-developed and well-nourished. No distress.  HENT:  Head: Normocephalic and atraumatic.  Right Ear: Tympanic membrane, external ear and ear canal normal.  Left Ear: Tympanic membrane, external ear and ear canal normal.  Nose:  Nose normal.  Mouth/Throat: Uvula is midline, oropharynx is clear and moist and mucous membranes are normal. No trismus in the jaw. Abnormal dentition. Dental caries present. No dental abscesses or uvula swelling. No tonsillar abscesses.  No obvious dental infection or abscess. No swelling or erythema noted on exam.  Eyes: Conjunctivae and EOM are normal.  Neck: Normal range of motion. Neck supple. No tracheal deviation present.  No Ludwig's angina. There is a 2 cm subcutaneous nodule, minimally tender to the left submandibular space.   Cardiovascular: Normal rate.   No murmur heard. Pulmonary/Chest: Effort normal. No respiratory distress. She has no wheezes. She has no rales.  Musculoskeletal: Normal range of motion.  Lymphadenopathy:    She has cervical adenopathy.  Neurological: She is alert and oriented to person, place, and time.  Skin: Skin is warm and dry.  Psychiatric: She has a normal mood and affect. Her behavior is normal.  Nursing note and vitals reviewed.   ED Course  Procedures (including critical care time) DIAGNOSTIC STUDIES: Oxygen Saturation is 96% on RA, normal by my interpretation.    COORDINATION OF CARE: 10:14 PM-Discussed treatment plan with pt at bedside and pt agreed to plan.     Labs Review Labs Reviewed - No data to display  Imaging Review No results found.   EKG Interpretation None       Vital signs reviewed and are as follows: Filed Vitals:   06/17/14 2236  BP: 137/80  Pulse: 84  Temp: 98.3 F (36.8 C)  Resp: 18   Will treat with penicillin for possible dental related infection. Patient encouraged to return with worsening swelling, overlying redness, fever, trouble swallowing or breathing. Patient verbalizes understanding and agrees with plan.  Patient counseled on use of narcotic pain medications. Counseled not to combine these medications with others containing tylenol. Urged not to drink alcohol, drive, or perform any other activities that requires focus while taking these medications. The patient verbalizes understanding and agrees with the plan.   MDM   Final diagnoses:  Neck swelling   Neck swelling: Possible etiology includes periapical abscess, reactive lymph node, swollen submandibular salivary gland. The areas is not exquisitely tender. There is no overlying redness to suggest an infection. Patient has no difficulty breathing or swallowing. No systemic symptoms of illness. No trismus. No immunocompromise which would predispose her to a deep space infection  in her neck. This does not appear to be associated with the thyroid gland. I do not feel the patient needs advanced imaging at this time. Will treat as dental infection given previous history and have patient monitor closely. We discussed signs and symptoms which should cause her to return. Patient appears well, nontoxic.   I personally performed the services described in this documentation, which was scribed in my presence. The recorded information has been reviewed and is accurate.      Valerie Cater, PA-C 06/17/14 [place], MD 06/17/14 [phone]

## 2014-07-03 ENCOUNTER — Emergency Department (HOSPITAL_COMMUNITY)
Admission: EM | Admit: 2014-07-03 | Discharge: 2014-07-03 | Disposition: A | Discharge status: Home / Self Care | Payer: Self-pay | Attending: Emergency Medicine | Admitting: Emergency Medicine

## 2014-07-03 ENCOUNTER — Encounter (HOSPITAL_COMMUNITY): Payer: Self-pay | Admitting: *Deleted

## 2014-07-03 DIAGNOSIS — I10 Essential (primary) hypertension: Secondary | ICD-10-CM | POA: Insufficient documentation

## 2014-07-03 DIAGNOSIS — Z79899 Other long term (current) drug therapy: Secondary | ICD-10-CM | POA: Insufficient documentation

## 2014-07-03 DIAGNOSIS — K219 Gastro-esophageal reflux disease without esophagitis: Secondary | ICD-10-CM | POA: Insufficient documentation

## 2014-07-03 DIAGNOSIS — Z720 Tobacco use: Secondary | ICD-10-CM | POA: Insufficient documentation

## 2014-07-03 DIAGNOSIS — K047 Periapical abscess without sinus: Secondary | ICD-10-CM | POA: Insufficient documentation

## 2014-07-03 DIAGNOSIS — Z792 Long term (current) use of antibiotics: Secondary | ICD-10-CM | POA: Insufficient documentation

## 2014-07-03 MED ORDER — AMOXICILLIN 500 MG PO CAPS: 500.0000 mg | ORAL_CAPSULE | Freq: Three times a day (TID) | ORAL | Status: DC

## 2014-07-03 MED ORDER — HYDROCODONE-ACETAMINOPHEN 5-325 MG PO TABS: ORAL_TABLET | ORAL | Status: DC

## 2014-07-03 MED ORDER — AMOXICILLIN 500 MG PO CAPS
500.0000 mg | ORAL_CAPSULE | Freq: Once | ORAL | Status: AC
  Administered 2014-07-03: 500 mg via ORAL
  Filled 2014-07-03: qty 1

## 2014-07-03 MED ORDER — BUPIVACAINE-EPINEPHRINE (PF) 0.5% -1:200000 IJ SOLN
1.8000 mL | Freq: Once | INTRAMUSCULAR | Status: AC
  Administered 2014-07-03: 1.8 mL

## 2014-07-03 MED ORDER — LISINOPRIL-HYDROCHLOROTHIAZIDE 10-12.5 MG PO TABS: 1.0000 | ORAL_TABLET | Freq: Every day | ORAL | Status: DC

## 2014-07-03 NOTE — ED Notes (Signed)
Pt to ED c/o toothache. Pt has been seen before for same; pt is attempting to locate a dentist, but needs a referral. R lower molar with dental cavity

## 2014-07-03 NOTE — ED Notes (Signed)
Pt c/o R lower molar pain. Has been seen for same and given antibiotics which help with the pain.

## 2014-07-03 NOTE — ED Provider Notes (Signed)
CSN: [id number]     Arrival date & time 07/03/14  1729 History  This chart was scribed for non-physician practitioner Valerie Blitz, PA working with Valerie Belling, MD, by Valerie Kerr, ED Scribe. This patient was seen in room TR06C/TR06C and the patient's care was started at 5:56 PM.    Chief Complaint  Patient presents with  . Dental Pain   The history is provided by the patient. No language interpreter was used.     HPI Comments: Valerie Kerr is a 54 y.o. female who presents to the Emergency Department complaining of chronic right lower molar pain that has been ongoing for quite some time. She admits to her face feeling warm to the touch and rates the pain as a 10/10 on the pain scale. Pt has been trying to see a dentist but needs a referral, causing her to come to the ED today. Pt also attempted to get a PCP but could only be seen in May. Pt was taking Percocet and antibiotics a few weeks ago but admits to being out. She has been taking Tylenol as of late but claims she is not having relief. She denies fever, chills, difficulty swallowing her secretions.   Past Medical History  Diagnosis Date  . Hypertension   . Acid reflux    Past Surgical History  Procedure Laterality Date  . Cervical disc surgery     History reviewed. No pertinent family history. History  Substance Use Topics  . Smoking status: Current Every Day Smoker  . Smokeless tobacco: Not on file  . Alcohol Use: No   OB History    No data available     Review of Systems  A complete 10 system review of systems was obtained and all systems are negative except as noted in the HPI and PMH.     Allergies  Fentanyl; Ibuprofen; Tramadol; and Tylenol  Home Medications   Prior to Admission medications   Medication Sig Start Date End Date Taking? Authorizing Provider  amoxicillin (AMOXIL) 500 MG capsule Take 1 capsule (500 mg total) by mouth 3 (three) times daily. 05/14/13   Valerie Carlota Raspberry, PA-C   Aspirin-Salicylamide-Caffeine (BC HEADACHE POWDER PO) Take 1 packet by mouth daily as needed (for pain).    Historical Provider, MD  Cetirizine HCl (ZYRTEC ALLERGY) 10 MG CAPS Take 1 capsule (10 mg total) by mouth daily. 05/05/14   Valerie Corpus, PA-C  lisinopril-hydrochlorothiazide (PRINZIDE,ZESTORETIC) 10-12.5 MG per tablet Take 1 tablet by mouth daily.    Historical Provider, MD  omeprazole (PRILOSEC) 20 MG capsule Take 20 mg by mouth daily.    Historical Provider, MD  oxyCODONE-acetaminophen (PERCOCET/ROXICET) 5-325 MG per tablet Take 1 tablet by mouth every 6 (six) hours as needed for severe pain. 06/17/14   Valerie Cater, PA-C  penicillin v potassium (VEETID) 500 MG tablet Take 1 tablet (500 mg total) by mouth 3 (three) times daily. 06/17/14   Valerie Cater, PA-C  pseudoephedrine (SUDAFED) 30 MG tablet Take 30 mg by mouth every 4 (four) hours as needed for congestion.    Historical Provider, MD   Triage Vitals: BP 165/86 mmHg  Pulse 81  Temp(Src) 98 F (36.7 C)  Resp 18  Wt 151 lb 7 oz (68.692 kg)  SpO2 97%   Physical Exam  Constitutional: She is oriented to person, place, and time. She appears well-developed and well-nourished. No distress.  HENT:  Head: Normocephalic and atraumatic.  Mouth/Throat: Uvula is midline and oropharynx is clear and moist. No trismus in the  jaw. No uvula swelling. No oropharyngeal exudate, posterior oropharyngeal edema, posterior oropharyngeal erythema or tonsillar abscesses.  Generally poor dentition, no gingival swelling, erythema. Patient has slight swelling on the right lower jaw. No overlying skin changes. There is no focal area of fluctuance inside the mouth. Patient is handling their secretions. There is no tenderness to palpation or firmness underneath tongue bilaterally. No trismus.    Eyes: Conjunctivae and EOM are normal.  Neck: Neck supple. No tracheal deviation present.  Cardiovascular: Normal rate.   Pulmonary/Chest: Effort normal. No stridor.  No respiratory distress.  Musculoskeletal: Normal range of motion.  Lymphadenopathy:    She has no cervical adenopathy.  Neurological: She is alert and oriented to person, place, and time.  Skin: Skin is warm and dry.  Psychiatric: She has a normal mood and affect. Her behavior is normal.  Nursing note and vitals reviewed.   ED Course  NERVE BLOCK Date/Time: 07/03/2014 6:38 PM Performed by: Valerie Kerr Authorized by: Valerie Kerr Consent: Verbal consent obtained. Consent given by: patient Patient identity confirmed: verbally with patient Indications: pain relief Body area: face/mouth Nerve: inferior alveolar Laterality: right Patient sedated: no Preparation: Patient was prepped and draped in the usual sterile fashion. Patient position: sitting Needle gauge: 38 G Location technique: anatomical landmarks Local anesthetic: bupivacaine 0.5% with epinephrine Anesthetic total: 1.8 ml Outcome: pain improved Patient tolerance: Patient tolerated the procedure well with no immediate complications   (including critical care time)  DIAGNOSTIC STUDIES: Oxygen Saturation is 97% on RA, normal by my interpretation.    COORDINATION OF CARE: 6:00 PM-Discussed treatment plan which includes I&D with pt at bedside and pt agreed to plan.   Labs Review Labs Reviewed - No data to display  Imaging Review No results found.   EKG Interpretation None      MDM   Final diagnoses:  Dental abscess  Essential hypertension    Filed Vitals:   07/03/14 1738  BP: 165/86  Pulse: 81  Temp: 98 F (36.7 C)  Resp: 18  Weight: 151 lb 7 oz (68.692 kg)  SpO2: 97%    Medications  amoxicillin (AMOXIL) capsule 500 mg (500 mg Oral Given 07/03/14 1812)  bupivacaine-epinephrine (MARCAINE W/ EPI) 0.5% -1:200000 injection 1.8 mL (1.8 mLs Infiltration Given 07/03/14 1813)    Valerie Kerr is a pleasant 54 y.o. female presenting with dental pain with associated dental abscess, there is no  focal fluctuance that would aid in eye incision and drainage. I will start her on amoxicillin, patient given inferior alveolar block in the ED with good relief. Patient has a few of her hypertension medications left that she is requesting a refill. I've advised the patient to follow closely with the wellness Center.  Evaluation does not show pathology that would require ongoing emergent intervention or inpatient treatment. Pt is hemodynamically stable and mentating appropriately. Discussed findings and plan with patient/guardian, who agrees with care plan. All questions answered. Return precautions discussed and outpatient follow up given.   Discharge Medication List as of 07/03/2014  6:14 PM    START taking these medications   Details  HYDROcodone-acetaminophen (NORCO/VICODIN) 5-325 MG per tablet Take 1-2 tablets by mouth every 6 hours as needed for pain and/or cough., Print         I personally performed the services described in this documentation, which was scribed in my presence. The recorded information has been reviewed and is accurate.     Valerie Blitz, PA-C 07/03/14 1838  Valerie Belling, MD 07/03/14 [phone]

## 2014-07-03 NOTE — Discharge Instructions (Signed)
Take percocet for breakthrough pain, do not drink alcohol, drive, care for children or do other critical tasks while taking percocet.  Return to the emergency room for fever, change in vision, redness to the face that rapidly spreads towards the eye, nausea or vomiting, difficulty swallowing or shortness of breath.   Apply warm compresses to jaw throughout the day.   Take your antibiotics as directed and to the end of the course.   Followup with a dentist is very important for ongoing evaluation and management of recurrent dental pain. Return to emergency department for emergent changing or worsening symptoms."  Low-cost dental clinic: [facility]  at [phone]**  **[place] at [phone] [address]**    You may also call [phone]  Dental Assistance If the dentist on-call cannot see you, please use the resources below:   Patients with Medicaid: [facility] [phone] [address], [place] [address] [phone]  If unable to pay, or uninsured, contact [facility] [phone]) or [place] [phone] in [place], [place] in [facility]) to become qualified for the adult dental clinic  Other [place]- [place], [place], [place], [address]    [phone], Ext. [phone]    2nd and 4th Thursday of the month at 6:30am    10 clients each day by appointment, can sometimes see walk-in     patients if someone does not show for an appointment [facility]- [address] [place], [place], [address]    [phone] [facility]- [address], [place], [place], [address]    [phone]  [place] [facility] [phone]    Dental Abscess A dental abscess is a collection of infected fluid (pus) from a bacterial infection in the inner part of the  tooth (pulp). It usually occurs at the end of the tooth's root.  CAUSES   Severe tooth decay.  Trauma to the tooth that allows bacteria to enter into the pulp, such as a broken or chipped tooth. SYMPTOMS   Severe pain in and around the infected tooth.  Swelling and redness around the abscessed tooth or in the mouth or face.  Tenderness.  Pus drainage.  Bad breath.  Bitter taste in the mouth.  Difficulty swallowing.  Difficulty opening the mouth.  Nausea.  Vomiting.  Chills.  Swollen neck glands. DIAGNOSIS   A medical and dental history will be taken.  An examination will be performed by tapping on the abscessed tooth.  X-rays may be taken of the tooth to identify the abscess. TREATMENT The goal of treatment is to eliminate the infection. You may be prescribed antibiotic medicine to stop the infection from spreading. A root canal may be performed to save the tooth. If the tooth cannot be saved, it may be pulled (extracted) and the abscess may be drained.  HOME CARE INSTRUCTIONS  Only take over-the-counter or prescription medicines for pain, fever, or discomfort as directed by your caregiver.  Rinse your mouth (gargle) often with salt water ( tsp salt in 8 oz [250 ml] of warm water) to relieve pain or swelling.  Do not drive after taking pain medicine (narcotics).  Do not apply heat to the outside of your face.  Return to your dentist for further treatment as directed. SEEK MEDICAL CARE IF:  Your pain is not helped by medicine.  Your pain is getting worse  instead of better. SEEK IMMEDIATE MEDICAL CARE IF:  You have a fever or persistent symptoms for more than 2-3 days.  You have a fever and your symptoms suddenly get worse.  You have chills or a very bad headache.  You have problems breathing or swallowing.  You have trouble opening your mouth.  You have swelling in the neck or around the eye. Document Released: 03/14/2005 Document Revised:  12/07/2011 Document Reviewed: 06/22/2010 [facility] Patient Information 2015 [place], [place]. This information is not intended to replace advice given to you by your health care provider. Make sure you discuss any questions you have with your health care provider.

## 2014-07-14 ENCOUNTER — Ambulatory Visit: Payer: Self-pay | Admitting: Internal Medicine

## 2014-08-29 ENCOUNTER — Encounter (HOSPITAL_COMMUNITY): Payer: Self-pay | Admitting: Emergency Medicine

## 2014-08-29 ENCOUNTER — Emergency Department (HOSPITAL_COMMUNITY)
Admission: EM | Admit: 2014-08-29 | Discharge: 2014-08-29 | Disposition: A | Discharge status: Home / Self Care | Payer: Self-pay | Attending: Emergency Medicine | Admitting: Emergency Medicine

## 2014-08-29 DIAGNOSIS — Z79899 Other long term (current) drug therapy: Secondary | ICD-10-CM | POA: Insufficient documentation

## 2014-08-29 DIAGNOSIS — Z8719 Personal history of other diseases of the digestive system: Secondary | ICD-10-CM | POA: Insufficient documentation

## 2014-08-29 DIAGNOSIS — R202 Paresthesia of skin: Secondary | ICD-10-CM | POA: Insufficient documentation

## 2014-08-29 DIAGNOSIS — M542 Cervicalgia: Secondary | ICD-10-CM | POA: Insufficient documentation

## 2014-08-29 DIAGNOSIS — Z720 Tobacco use: Secondary | ICD-10-CM | POA: Insufficient documentation

## 2014-08-29 DIAGNOSIS — I10 Essential (primary) hypertension: Secondary | ICD-10-CM | POA: Insufficient documentation

## 2014-08-29 DIAGNOSIS — Z792 Long term (current) use of antibiotics: Secondary | ICD-10-CM | POA: Insufficient documentation

## 2014-08-29 DIAGNOSIS — R200 Anesthesia of skin: Secondary | ICD-10-CM | POA: Insufficient documentation

## 2014-08-29 MED ORDER — OXYCODONE-ACETAMINOPHEN 5-325 MG PO TABS
2.0000 | ORAL_TABLET | ORAL | Status: DC | PRN
Start: 2014-08-29 — End: 2016-04-01

## 2014-08-29 MED ORDER — METHOCARBAMOL 500 MG PO TABS: 500.0000 mg | ORAL_TABLET | Freq: Two times a day (BID) | ORAL | Status: DC

## 2014-08-29 MED ORDER — OXYCODONE-ACETAMINOPHEN 5-325 MG PO TABS
2.0000 | ORAL_TABLET | Freq: Once | ORAL | Status: AC
  Administered 2014-08-29: 2 via ORAL
  Filled 2014-08-29: qty 2

## 2014-08-29 NOTE — ED Provider Notes (Signed)
CSN: [id number]     Arrival date & time 08/29/14  1106 History  This chart was scribed for non-physician practitioner, Ottie Glazier, PA-C working with Ernestina Patches, MD by Hansel Feinstein, ED scribe. This patient was seen in room TR06C/TR06C and the patient's care was started at 11:15 AM    Chief Complaint  Patient presents with  . Neck Pain   The history is provided by the patient. No language interpreter was used.    HPI Comments: Valerie Kerr is a 54 y.o. female with Hx of cervical disc surgery who presents to the Emergency Department complaining of moderate, constant neck pain radiating to the medial upper back, right shoulder and right arm onset yesterday and worsened at 0300. She states associated numbness and tingling in her arm, but notes that this is baseline she has had this since her surgery. Pt reports that she occasionally has similar pain as a result of her neck surgery in 2012. She states that pain is worsened with movement. Pt reports that she has been taking Tylenol for pain with moderate relief. She denies previous injury or fall. She denies fever, chills, headache, dizziness, weakness, stiff neck, rashes.  Past Medical History  Diagnosis Date  . Hypertension   . Acid reflux    Past Surgical History  Procedure Laterality Date  . Cervical disc surgery     No family history on file. History  Substance Use Topics  . Smoking status: Current Every Day Smoker  . Smokeless tobacco: Not on file  . Alcohol Use: No   OB History    No data available     Review of Systems  Musculoskeletal: Positive for arthralgias and neck pain.  Neurological: Positive for numbness ( baseline).    Allergies  Fentanyl; Ibuprofen; Tramadol; and Tylenol  Home Medications   Prior to Admission medications   Medication Sig Start Date End Date Taking? Authorizing Provider  amoxicillin (AMOXIL) 500 MG capsule Take 1 capsule (500 mg total) by mouth 3 (three) times daily. 07/03/14   Nicole  Pisciotta, PA-C  HYDROcodone-acetaminophen (NORCO/VICODIN) 5-325 MG per tablet Take 1-2 tablets by mouth every 6 hours as needed for pain and/or cough. 07/03/14   Nicole Pisciotta, PA-C  lisinopril-hydrochlorothiazide (PRINZIDE) 10-12.5 MG per tablet Take 1 tablet by mouth daily. 07/03/14   Nicole Pisciotta, PA-C  methocarbamol (ROBAXIN) 500 MG tablet Take 1 tablet (500 mg total) by mouth 2 (two) times daily. 08/29/14   Chamille Werntz Patel-Mills, PA-C  oxyCODONE-acetaminophen (PERCOCET/ROXICET) 5-325 MG per tablet Take 2 tablets by mouth every 4 (four) hours as needed for severe pain. 08/29/14   Gwen Edler Patel-Mills, PA-C   Triage VS: BP 153/81 mmHg  Pulse 78  Temp(Src) 98 F (36.7 C) (Oral)  Resp 18  Ht 5\' 1"  (1.549 m)  Wt 154 lb (69.854 kg)  BMI 29.11 kg/m2  SpO2 97% Physical Exam  Constitutional: She is oriented to person, place, and time. She appears well-developed and well-nourished. No distress.  HENT:  Head: Normocephalic and atraumatic.  Mouth/Throat: Oropharynx is clear and moist.  Eyes: Conjunctivae and EOM are normal. Pupils are equal, round, and reactive to light.  Neck: Normal range of motion. Neck supple. No tracheal deviation present.  Cardiovascular: Normal rate and normal heart sounds.  Exam reveals no gallop and no friction rub.   No murmur heard. Pulmonary/Chest: Breath sounds normal. No respiratory distress.  Abdominal: Soft. She exhibits no distension. There is no tenderness. There is no rebound and no guarding.  Musculoskeletal: Normal range of  motion. She exhibits no edema or tenderness.  Right sided thoracic paravertebral tenderness to palpation. No rashes, erythema, edema, or echymosis. Linear scar to anterior neck. No midline cervical or thoracic tenderness.  Right arm: She is able to abduct above her head and adduct without difficulty.  Neurological: She is alert and oriented to person, place, and time.  Brachioradialis 2+ Strength is 5/5 with head movement against resistance.  Good grip and arm strength. She is able to flex and extend neck fully and without difficulty.   Skin: Skin is warm and dry.  Psychiatric: She has a normal mood and affect. Her behavior is normal.  Nursing note and vitals reviewed.   ED Course  Procedures (including critical care time) DIAGNOSTIC STUDIES: Oxygen Saturation is 97% on RA, normal by my interpretation.    COORDINATION OF CARE: 11:24 AM Discussed treatment plan with pt at bedside and pt agreed to plan.   Labs Review Labs Reviewed - No data to display  Imaging Review No results found.   EKG Interpretation None     MDM   Final diagnoses:  Neck pain  Patient presents for chronic neck pain after surgery 2012. She denies any injury or fall. She states she normally takes Tylenol but her pain has been worse the last couple of days. Her vitals are stable and she appears in no acute distress. She does not have any meningismus signs. No weakness on exam. She can follow up with a PCP using the resource guide which she verbally agrees to. I have given her Percocet for breakthrough pain and Robaxin. She states she cannot take ibuprofen because it causes her respiratory difficulties.  I personally performed the services described in this documentation, which was scribed in my presence. The recorded information has been reviewed and is accurate.    Ottie Glazier, PA-C 08/29/14 [place], MD 08/30/14 [phone]

## 2014-08-29 NOTE — Discharge Instructions (Signed)
Musculoskeletal Pain Take tylenol for pain and percocet for breakthrough pain.  Take muscle relaxers as prescribed. Follow up with a physician below using the resource guide.  Musculoskeletal pain is muscle and boney aches and pains. These pains can occur in any part of the body. Your caregiver may treat you without knowing the cause of the pain. They may treat you if blood or urine tests, X-rays, and other tests were normal.  CAUSES There is often not a definite cause or reason for these pains. These pains may be caused by a type of germ (virus). The discomfort may also come from overuse. Overuse includes working out too hard when your body is not fit. Boney aches also come from weather changes. Bone is sensitive to atmospheric pressure changes. HOME CARE INSTRUCTIONS   Ask when your test results will be ready. Make sure you get your test results.  Only take over-the-counter or prescription medicines for pain, discomfort, or fever as directed by your caregiver. If you were given medications for your condition, do not drive, operate machinery or power tools, or sign legal documents for 24 hours. Do not drink alcohol. Do not take sleeping pills or other medications that may interfere with treatment.  Continue all activities unless the activities cause more pain. When the pain lessens, slowly resume normal activities. Gradually increase the intensity and duration of the activities or exercise.  During periods of severe pain, bed rest may be helpful. Lay or sit in any position that is comfortable.  Putting ice on the injured area.  Put ice in a bag.  Place a towel between your skin and the bag.  Leave the ice on for 15 to 20 minutes, 3 to 4 times a day.  Follow up with your caregiver for continued problems and no reason can be found for the pain. If the pain becomes worse or does not go away, it may be necessary to repeat tests or do additional testing. Your caregiver may need to look further for  a possible cause. SEEK IMMEDIATE MEDICAL CARE IF:  You have pain that is getting worse and is not relieved by medications.  You develop chest pain that is associated with shortness or breath, sweating, feeling sick to your stomach (nauseous), or throw up (vomit).  Your pain becomes localized to the abdomen.  You develop any new symptoms that seem different or that concern you. MAKE SURE YOU:   Understand these instructions.  Will watch your condition.  Will get help right away if you are not doing well or get worse. Document Released: 03/14/2005 Document Revised: 06/06/2011 Document Reviewed: 11/16/2012 [facility] Patient Information 2015 [place], [place]. This information is not intended to replace advice given to you by your health care provider. Make sure you discuss any questions you have with your health care provider.

## 2014-08-29 NOTE — ED Notes (Signed)
Pt has chronic neck pain, states its worse today. Worse with movement. Pt ambulatory. Sts back surgery in 2012, had chronic pain since then.

## 2014-08-29 NOTE — ED Notes (Signed)
Pt is in stable condition upon d/c and ambulates from ED. 

## 2014-11-04 ENCOUNTER — Emergency Department (HOSPITAL_COMMUNITY)
Admission: EM | Admit: 2014-11-04 | Discharge: 2014-11-04 | Disposition: A | Discharge status: Home / Self Care | Payer: Self-pay | Attending: Emergency Medicine | Admitting: Emergency Medicine

## 2014-11-04 ENCOUNTER — Emergency Department (HOSPITAL_COMMUNITY): Payer: Self-pay

## 2014-11-04 ENCOUNTER — Encounter (HOSPITAL_COMMUNITY): Payer: Self-pay | Admitting: Physical Medicine and Rehabilitation

## 2014-11-04 DIAGNOSIS — I10 Essential (primary) hypertension: Secondary | ICD-10-CM | POA: Insufficient documentation

## 2014-11-04 DIAGNOSIS — Z8719 Personal history of other diseases of the digestive system: Secondary | ICD-10-CM | POA: Insufficient documentation

## 2014-11-04 DIAGNOSIS — M549 Dorsalgia, unspecified: Secondary | ICD-10-CM | POA: Insufficient documentation

## 2014-11-04 DIAGNOSIS — G8929 Other chronic pain: Secondary | ICD-10-CM | POA: Insufficient documentation

## 2014-11-04 DIAGNOSIS — N939 Abnormal uterine and vaginal bleeding, unspecified: Secondary | ICD-10-CM | POA: Insufficient documentation

## 2014-11-04 DIAGNOSIS — Z8739 Personal history of other diseases of the musculoskeletal system and connective tissue: Secondary | ICD-10-CM

## 2014-11-04 DIAGNOSIS — M25561 Pain in right knee: Secondary | ICD-10-CM | POA: Insufficient documentation

## 2014-11-04 DIAGNOSIS — Z720 Tobacco use: Secondary | ICD-10-CM | POA: Insufficient documentation

## 2014-11-04 DIAGNOSIS — J0100 Acute maxillary sinusitis, unspecified: Secondary | ICD-10-CM | POA: Insufficient documentation

## 2014-11-04 DIAGNOSIS — Z79899 Other long term (current) drug therapy: Secondary | ICD-10-CM | POA: Insufficient documentation

## 2014-11-04 LAB — WET PREP, GENITAL
Clue Cells Wet Prep HPF POC: NONE SEEN
Trich, Wet Prep: NONE SEEN
Yeast Wet Prep HPF POC: NONE SEEN

## 2014-11-04 MED ORDER — HYDROCODONE-ACETAMINOPHEN 5-325 MG PO TABS: 1.0000 | ORAL_TABLET | Freq: Four times a day (QID) | ORAL | Status: DC | PRN

## 2014-11-04 MED ORDER — NYSTATIN-TRIAMCINOLONE 100000-0.1 UNIT/GM-% EX CREA: TOPICAL_CREAM | CUTANEOUS | Status: DC

## 2014-11-04 MED ORDER — AMOXICILLIN 500 MG PO CAPS
500.0000 mg | ORAL_CAPSULE | Freq: Three times a day (TID) | ORAL | Status: DC
Start: 2014-11-04 — End: 2016-03-23

## 2014-11-04 MED ORDER — PSEUDOEPHEDRINE HCL 30 MG PO TABS: 30.0000 mg | ORAL_TABLET | Freq: Four times a day (QID) | ORAL | Status: DC | PRN

## 2014-11-04 MED ORDER — AMOXICILLIN 500 MG PO CAPS
500.0000 mg | ORAL_CAPSULE | Freq: Once | ORAL | Status: AC
  Administered 2014-11-04: 500 mg via ORAL
  Filled 2014-11-04: qty 1

## 2014-11-04 NOTE — ED Notes (Signed)
Pt presents to department for evaluation of multiple complaints, reports sinus congestion, back pain and vaginal itching. Ongoing for several days. Pt is alert and oriented x4. NAD

## 2014-11-04 NOTE — Discharge Instructions (Signed)
Follow up with [facility].

## 2014-11-04 NOTE — ED Provider Notes (Signed)
CSN: [id number]     Arrival date & time 11/04/14  1522 History  This chart was scribed for non-physician practitioner, Debroah Baller, NP working with Milton Ferguson, MD by Tula Nakayama, ED scribe. This patient was seen in room TR03C/TR03C and the patient's care was started at 4:42 PM   Chief Complaint  Patient presents with  . Vaginal Itching  . Back Pain  . Nasal Congestion   Patient is a 54 y.o. female presenting with vaginal itching and back pain. The history is provided by the patient. No language interpreter was used.  Vaginal Itching This is a new problem. The current episode started more than 2 days ago. The problem occurs constantly. The problem has not changed since onset.Pertinent negatives include no chest pain, no abdominal pain, no headaches and no shortness of breath. Nothing aggravates the symptoms. Nothing relieves the symptoms. Treatments tried: Monistat. The treatment provided no relief.  Back Pain Associated symptoms: numbness   Associated symptoms: no abdominal pain, no chest pain, no dysuria and no headaches     HPI Comments: Valerie Kerr is a 54 y.o. female who presents to the Emergency Department complaining of constant, moderate nasal congestion that started a few days ago. She states sinus pressure, post-nasal drip and sore throat as associated symptoms. Pt has not tried any treatment PTA. She denies cough, nausea, CP, ear pain and SOB.  Pt also complains of vaginal itching that started several days ago. She states lower back pain as an associated symptom. She has tried Monistat with no relief. Pt denies vaginal discharge, frequency and dysuria as associated symptoms.   Pt reports that she has multiple complaints and wants to see a PCP in [place] to manage her symptoms. She has been in the area for 5 months and has trouble finding someone to manage her chronic neck, arm and knee pain. Pt reports intermittent numbness and tingling in her left arm and swelling of her  right knee consistent with her chronic symptoms, but denies any new onset numbness or swelling today.  Patient states that her reason for the visit is for her sinus infection and vaginal itching. She also request pain management for her chronic pain of her back. She was here in June for pain.   Past Medical History  Diagnosis Date  . Hypertension   . Acid reflux    Past Surgical History  Procedure Laterality Date  . Cervical disc surgery     No family history on file. Social History  Substance Use Topics  . Smoking status: Current Every Day Smoker    Types: Cigarettes  . Smokeless tobacco: None  . Alcohol Use: No   OB History    No data available     Review of Systems  HENT: Positive for congestion, postnasal drip, sinus pressure and sore throat. Negative for ear pain.   Respiratory: Negative for cough and shortness of breath.   Cardiovascular: Negative for chest pain.  Gastrointestinal: Negative for abdominal pain.  Genitourinary: Positive for vaginal pain. Negative for dysuria, frequency and vaginal bleeding.  Musculoskeletal: Positive for back pain and joint swelling.  Neurological: Positive for numbness. Negative for headaches.  All other systems reviewed and are negative.  Allergies  Fentanyl; Ibuprofen; Tramadol; and Tylenol  Home Medications   Prior to Admission medications   Medication Sig Start Date End Date Taking? Authorizing Provider  amoxicillin (AMOXIL) 500 MG capsule Take 1 capsule (500 mg total) by mouth 3 (three) times daily. 11/04/14   Gaetan Spieker M  Janit Bern, NP  HYDROcodone-acetaminophen (NORCO) 5-325 MG per tablet Take 1 tablet by mouth every 6 (six) hours as needed. 11/04/14   Christpoher Sievers Bunnie Pion, NP  lisinopril-hydrochlorothiazide (PRINZIDE) 10-12.5 MG per tablet Take 1 tablet by mouth daily. 07/03/14   Nicole Pisciotta, PA-C  methocarbamol (ROBAXIN) 500 MG tablet Take 1 tablet (500 mg total) by mouth 2 (two) times daily. 08/29/14   Ottie Glazier, PA-C   nystatin-triamcinolone (MYCOLOG II) cream Apply to affected area daily 11/04/14   [place], NP  oxyCODONE-acetaminophen (PERCOCET/ROXICET) 5-325 MG per tablet Take 2 tablets by mouth every 4 (four) hours as needed for severe pain. 08/29/14   Hanna Patel-Mills, PA-C  pseudoephedrine (SUDAFED) 30 MG tablet Take 1 tablet (30 mg total) by mouth every 6 (six) hours as needed for congestion. 11/04/14   Herbert Marken Bunnie Pion, NP   BP 138/80 mmHg  Pulse 68  Temp(Src) 98.8 F (37.1 C) (Oral)  Resp 12  SpO2 99% Physical Exam  Constitutional: She is oriented to person, place, and time. She appears well-developed and well-nourished. No distress.  Ambulatory to exam room without difficulty  HENT:  Head: Normocephalic and atraumatic.  Right Ear: Tympanic membrane normal.  Left Ear: Tympanic membrane normal.  Nose: Rhinorrhea present.  Mouth/Throat: Uvula is midline, oropharynx is clear and moist and mucous membranes are normal.  Frontal sinuses tender  Eyes: Conjunctivae and EOM are normal.  Neck: Neck supple. No tracheal deviation present.  Cardiovascular: Normal rate.   Pulmonary/Chest: Effort normal. No respiratory distress.  Abdominal: Soft. There is no tenderness.  Genitourinary:  External genitalia: Skin irritation secondary to shaving, no signs of infection Vaginal vault: Mucous discharge with scant blood  Cervix friable No cervical motion tenderness No adnexal tenderness Uterus without palpable enlargement  Musculoskeletal:       Right knee: She exhibits swelling. She exhibits no ecchymosis, no deformity, no laceration, no erythema and normal alignment. Tenderness found.  Pedal pulses 2+ bilateral, adequate circulation, good touch sensation.   Neurological: She is alert and oriented to person, place, and time.  Skin: Skin is warm and dry.  Psychiatric: She has a normal mood and affect. Her behavior is normal.  Nursing note and vitals reviewed.   ED Course  Procedures   DIAGNOSTIC  STUDIES: Oxygen Saturation is 98% on RA, normal by my interpretation.    COORDINATION OF CARE: 4:55 PM Discussed treatment plan with pt which includes an x-ray of her right knee and a pelvic exam. Pt agreed to plan.    Labs Review Labs Reviewed  WET PREP, GENITAL - Abnormal; Notable for the following:    WBC, Wet Prep HPF POC FEW (*)    All other components within normal limits  GC/CHLAMYDIA PROBE AMP ([place]) NOT AT [facility]    Imaging Review Dg Knee Complete 4 Views Right  11/04/2014   CLINICAL DATA:  RIGHT knee pain for 3 days, no injury, unable to bear weight due to pain  EXAM: RIGHT KNEE - COMPLETE 4+ VIEW  COMPARISON:  None.  FINDINGS: Bone mineralization normal.  Joint spaces preserved.  No fracture, dislocation, or bone destruction.  No joint effusion.  IMPRESSION: Normal exam.   Electronically Signed   By: Lavonia Dana M.D.   On: 11/04/2014 17:33     MDM  55 y.o. female with multiple chronic complaints and sinus congestion and cough. Stable for d/c without focal neuro deficits. Will treat for sinusitis. Discussed with patient chronic pain and need for PCP. I have given  the [facility]. She is to call tomorrow to schedule follow up.   Final diagnoses:  Acute maxillary sinusitis, recurrence not specified  History of chronic back pain  Right knee pain  Vaginal bleeding, abnormal   I personally performed the services described in this documentation, which was scribed in my presence. The recorded information has been reviewed and is accurate.    [address] [place], [place] 11/06/14 [place], MD 11/08/14 [phone]

## 2014-11-05 LAB — GC/CHLAMYDIA PROBE AMP (~~LOC~~) NOT AT ARMC
Chlamydia: NEGATIVE
Neisseria Gonorrhea: NEGATIVE

## 2014-11-28 ENCOUNTER — Encounter: Payer: Self-pay | Admitting: Obstetrics and Gynecology

## 2014-11-28 ENCOUNTER — Ambulatory Visit (INDEPENDENT_AMBULATORY_CARE_PROVIDER_SITE_OTHER): Payer: Self-pay | Admitting: Obstetrics and Gynecology

## 2014-11-28 VITALS — BP 138/84 | HR 78 | Temp 98.8°F | Ht 63.0 in | Wt 148.7 lb

## 2014-11-28 DIAGNOSIS — N764 Abscess of vulva: Secondary | ICD-10-CM

## 2014-11-28 MED ORDER — DOXYCYCLINE HYCLATE 100 MG PO CAPS: 100.0000 mg | ORAL_CAPSULE | Freq: Two times a day (BID) | ORAL | Status: DC

## 2014-11-28 NOTE — Progress Notes (Signed)
   Subjective:    Patient ID: Valerie Kerr, female    DOB: 1960/12/07, 54 y.o.   MRN: [id number]  HPI 54 yo referred from the ED for the evaluation of vulva irritation. Patient reports onset of symptoms for the past month as a result of shaving. She describes her irritation as uncomfortable making it hard to sit and pruritic. Patient reports being menopausal since the age of 8. She denies any episodes of vaginal bleeding since entering menopause. She denies any pelvic or abdominal pain. She denies any urinary incontinence.  Past Medical History  Diagnosis Date  . Hypertension   . Acid reflux    Past Surgical History  Procedure Laterality Date  . Cervical disc surgery     No family history on file. Social History  Substance Use Topics  . Smoking status: Current Every Day Smoker    Types: Cigarettes  . Smokeless tobacco: None  . Alcohol Use: No     Review of Systems See pertinent in HPI    Objective:   Physical Exam GENERAL: Well-developed, well-nourished female in no acute distress.  PELVIC: Normal external female genitalia with small boil on left labia majora measuring 2 cm. Tender to touch, non-fluctuant with a small area of drainage. Mons pubis with areas of excoriation and erythema noted in groin consistent with candida infection EXTREMITIES: No cyanosis, clubbing, or edema, 2+ distal pulses.      Assessment & Plan:  54 yo with left labial abscess - Area not ready for I&D - Rx Doxycycline provided - Advised to apply warm compresses to the are - Patient unable to afford mycolog cream previously prescribes. Advised to apply monistat cream and to follow with A&D ointment - RTC prn

## 2014-11-28 NOTE — Patient Instructions (Addendum)
Apply monistat twice daily to groin and top of vulva followed by desittin cream or A&D ointment  Apply warm compresses to the abscess every hour for 15 minutes if possible. This will help the abscess drain and speed up the healing process. The antibiotic will help contain the abscess and prevent it from draining   Abscess An abscess is an infected area that contains a collection of pus and debris.It can occur in almost any part of the body. An abscess is also known as a furuncle or boil. CAUSES  An abscess occurs when tissue gets infected. This can occur from blockage of oil or sweat glands, infection of hair follicles, or a minor injury to the skin. As the body tries to fight the infection, pus collects in the area and creates pressure under the skin. This pressure causes pain. People with weakened immune systems have difficulty fighting infections and get certain abscesses more often.  SYMPTOMS Usually an abscess develops on the skin and becomes a painful mass that is red, warm, and tender. If the abscess forms under the skin, you may feel a moveable soft area under the skin. Some abscesses break open (rupture) on their own, but most will continue to get worse without care. The infection can spread deeper into the body and eventually into the bloodstream, causing you to feel ill.  DIAGNOSIS  Your caregiver will take your medical history and perform a physical exam. A sample of fluid may also be taken from the abscess to determine what is causing your infection. TREATMENT  Your caregiver may prescribe antibiotic medicines to fight the infection. However, taking antibiotics alone usually does not cure an abscess. Your caregiver may need to make a small cut (incision) in the abscess to drain the pus. In some cases, gauze is packed into the abscess to reduce pain and to continue draining the area. HOME CARE INSTRUCTIONS   Only take over-the-counter or prescription medicines for pain, discomfort, or  fever as directed by your caregiver.  If you were prescribed antibiotics, take them as directed. Finish them even if you start to feel better.  If gauze is used, follow your caregiver's directions for changing the gauze.  To avoid spreading the infection:  Keep your draining abscess covered with a bandage.  Wash your hands well.  Do not share personal care items, towels, or whirlpools with others.  Avoid skin contact with others.  Keep your skin and clothes clean around the abscess.  Keep all follow-up appointments as directed by your caregiver. SEEK MEDICAL CARE IF:   You have increased pain, swelling, redness, fluid drainage, or bleeding.  You have muscle aches, chills, or a general ill feeling.  You have a fever. MAKE SURE YOU:   Understand these instructions.  Will watch your condition.  Will get help right away if you are not doing well or get worse. Document Released: 12/22/2004 Document Revised: 09/13/2011 Document Reviewed: 05/27/2011 [facility] Patient Information 2015 [place], [place]. This information is not intended to replace advice given to you by your health care provider. Make sure you discuss any questions you have with your health care provider.

## 2015-02-03 ENCOUNTER — Encounter (HOSPITAL_COMMUNITY): Payer: Self-pay | Admitting: Emergency Medicine

## 2015-02-03 ENCOUNTER — Emergency Department (HOSPITAL_COMMUNITY)
Admission: EM | Admit: 2015-02-03 | Discharge: 2015-02-03 | Disposition: A | Discharge status: Home / Self Care | Payer: Self-pay | Attending: Emergency Medicine | Admitting: Emergency Medicine

## 2015-02-03 DIAGNOSIS — L02411 Cutaneous abscess of right axilla: Secondary | ICD-10-CM | POA: Insufficient documentation

## 2015-02-03 DIAGNOSIS — Z792 Long term (current) use of antibiotics: Secondary | ICD-10-CM | POA: Insufficient documentation

## 2015-02-03 DIAGNOSIS — Z8719 Personal history of other diseases of the digestive system: Secondary | ICD-10-CM | POA: Insufficient documentation

## 2015-02-03 DIAGNOSIS — I10 Essential (primary) hypertension: Secondary | ICD-10-CM | POA: Insufficient documentation

## 2015-02-03 DIAGNOSIS — R05 Cough: Secondary | ICD-10-CM | POA: Insufficient documentation

## 2015-02-03 DIAGNOSIS — Z720 Tobacco use: Secondary | ICD-10-CM | POA: Insufficient documentation

## 2015-02-03 DIAGNOSIS — L02419 Cutaneous abscess of limb, unspecified: Secondary | ICD-10-CM

## 2015-02-03 DIAGNOSIS — R110 Nausea: Secondary | ICD-10-CM | POA: Insufficient documentation

## 2015-02-03 DIAGNOSIS — Z791 Long term (current) use of non-steroidal anti-inflammatories (NSAID): Secondary | ICD-10-CM | POA: Insufficient documentation

## 2015-02-03 DIAGNOSIS — Z79899 Other long term (current) drug therapy: Secondary | ICD-10-CM | POA: Insufficient documentation

## 2015-02-03 MED ORDER — IBUPROFEN 800 MG PO TABS: 800.0000 mg | ORAL_TABLET | Freq: Three times a day (TID) | ORAL | Status: DC

## 2015-02-03 MED ORDER — SULFAMETHOXAZOLE-TRIMETHOPRIM 800-160 MG PO TABS
1.0000 | ORAL_TABLET | Freq: Two times a day (BID) | ORAL | Status: AC
Start: 2015-02-03 — End: 2015-02-10

## 2015-02-03 MED ORDER — SULFAMETHOXAZOLE-TRIMETHOPRIM 800-160 MG PO TABS
1.0000 | ORAL_TABLET | Freq: Once | ORAL | Status: AC
  Administered 2015-02-03: 1 via ORAL
  Filled 2015-02-03: qty 1

## 2015-02-03 MED ORDER — IBUPROFEN 800 MG PO TABS
800.0000 mg | ORAL_TABLET | Freq: Once | ORAL | Status: AC
  Administered 2015-02-03: 800 mg via ORAL
  Filled 2015-02-03: qty 1

## 2015-02-03 NOTE — ED Notes (Signed)
Pt. reports abscess at right axilla onset last month with no drainage , pt. added mild nausea , nasal congestion and headache . Denies fever .

## 2015-02-03 NOTE — ED Notes (Signed)
Patient verbalized understanding of discharge instructions and prescription medications and denies further needs/questions at this time. VS stable.

## 2015-02-03 NOTE — Discharge Instructions (Signed)
Abscess An abscess is an infected area that contains a collection of pus and debris.It can occur in almost any part of the body. An abscess is also known as a furuncle or boil. CAUSES  An abscess occurs when tissue gets infected. This can occur from blockage of oil or sweat glands, infection of hair follicles, or a minor injury to the skin. As the body tries to fight the infection, pus collects in the area and creates pressure under the skin. This pressure causes pain. People with weakened immune systems have difficulty fighting infections and get certain abscesses more often.  SYMPTOMS Usually an abscess develops on the skin and becomes a painful mass that is red, warm, and tender. If the abscess forms under the skin, you may feel a moveable soft area under the skin. Some abscesses break open (rupture) on their own, but most will continue to get worse without care. The infection can spread deeper into the body and eventually into the bloodstream, causing you to feel ill.  DIAGNOSIS  Your caregiver will take your medical history and perform a physical exam. A sample of fluid may also be taken from the abscess to determine what is causing your infection. TREATMENT  Your caregiver may prescribe antibiotic medicines to fight the infection. However, taking antibiotics alone usually does not cure an abscess. Your caregiver may need to make a small cut (incision) in the abscess to drain the pus. In some cases, gauze is packed into the abscess to reduce pain and to continue draining the area. HOME CARE INSTRUCTIONS   Only take over-the-counter or prescription medicines for pain, discomfort, or fever as directed by your caregiver.  If you were prescribed antibiotics, take them as directed. Finish them even if you start to feel better.  If gauze is used, follow your caregiver's directions for changing the gauze.  To avoid spreading the infection:  Keep your draining abscess covered with a  bandage.  Wash your hands well.  Do not share personal care items, towels, or whirlpools with others.  Avoid skin contact with others.  Keep your skin and clothes clean around the abscess.  Keep all follow-up appointments as directed by your caregiver. SEEK MEDICAL CARE IF:   You have increased pain, swelling, redness, fluid drainage, or bleeding.  You have muscle aches, chills, or a general ill feeling.  You have a fever. MAKE SURE YOU:   Understand these instructions.  Will watch your condition.  Will get help right away if you are not doing well or get worse.   This information is not intended to replace advice given to you by your health care provider. Make sure you discuss any questions you have with your health care provider.   Document Released: 12/22/2004 Document Revised: 09/13/2011 Document Reviewed: 05/27/2011 Elsevier Interactive Patient Education 2016 [employer].  Emergency Department Resource Guide 1) Find a Doctor and Pay Out of Pocket Although you won't have to find out who is covered by your insurance plan, it is a good idea to ask around and get recommendations. You will then need to call the office and see if the doctor you have chosen will accept you as a new patient and what types of options they offer for patients who are self-pay. Some doctors offer discounts or will set up payment plans for their patients who do not have insurance, but you will need to ask so you aren't surprised when you get to your appointment.  2) Contact Your Local Health Department  Not all health departments have doctors that can see patients for sick visits, but many do, so it is worth a call to see if yours does. If you don't know where your local health department is, you can check in your phone book. The CDC also has a tool to help you locate your state's health department, and many state websites also have listings of all of their local health departments.  3) Find a [facility] If your illness is not likely to be very severe or complicated, you may want to try a walk in clinic. These are popping up all over the country in pharmacies, drugstores, and shopping centers. They're usually staffed by nurse practitioners or physician assistants that have been trained to treat common illnesses and complaints. They're usually fairly quick and inexpensive. However, if you have serious medical issues or chronic medical problems, these are probably not your best option.  No Primary Care Doctor: - Call Health Connect at  [phone] - they can help you locate a primary care doctor that  accepts your insurance, provides certain services, etc. - Physician Referral Service- [phone]  Chronic Pain Problems: Organization         Address  Phone   Notes  [facility]  [phone] Patients need to be referred by their primary care doctor.   Medication Assistance: Organization         Address  Phone   Notes  [facility] Medication [facility] [address] [place], [place] [address] [phone] --Must be a resident of [facility] -- Must have NO insurance coverage whatsoever (no Medicaid/ Medicare, etc.) -- The pt. MUST have a primary care doctor that directs their care regularly and follows them in the community   MedAssist  [phone]   [employer]  [phone]    Agencies that provide inexpensive medical care: Organization         Address  Phone   Notes  [place]  [phone]   [facility]    [phone]   [facility] [place],  [address] [phone]   [place] [address], [place] [phone]   Planned Parenthood    [phone]   [facility]    [phone]   [place] and [place] [address], [place] Phone:  [phone], Fax:  [phone] Hours  of Operation:  9 am - 6 pm, M-F.  Also accepts Medicaid/Medicare and self-pay.  [facility] for [place], [address], [place] Phone: [phone], Fax: [phone]. Hours of Operation:  8:30 am - 5:30 pm, M-F.  Also accepts Medicaid and self-pay.  [facility] [address], [place] Phone: [phone]   [place], [place], [place] [phone], Ext. [phone] Mondays & Thursdays: 7-9 AM.  First 15 patients are seen on a first come, first serve basis.    [place] Providers:  Organization         Address  Phone   Notes  [facility] [address], [place] [phone] Also accepts self-pay patients.  [place], [place]  [phone]   [place], [place], [place] [phone]  [place] [address] [place] [phone]   Lucianne Lei [address], [place]   [phone] Only accepts [place] Access [place] patients after they have their name applied to their card.   Self-Pay (no insurance) in [facility]:  Organization         Address  Phone   Notes  Sickle Cell Patients, [facility] [place] [phone]   [facility] [place] [phone]   [facility] [place]  [place], [place], [place] [phone]   [facility]/Dr. Osei-Bonsu  [address] [place] or [address], [place] [phone] Phone number for both [place] and [place] locations is the same.  Urgent Medical and [facility] [address], [place] [phone]   [facility] [address] [place] or [address] [phone]   [facility] [address], [place] [phone], phone; [phone], fax Sees patients 1st and 3rd Saturday of every month.  Must not qualify for public or private insurance (i.e. Medicaid, Medicare, [place] Health Choice, Veterans' Benefits)  Household income should be no more than 200% of the poverty level The clinic cannot treat you if you are pregnant or think you are pregnant  Sexually transmitted diseases are not treated at the clinic.    Dental Care: Organization         Address  Phone  Notes  [facility] Department of [facility] [place] [phone] Accepts children up to age 54 who are enrolled in [place] or [place]; pregnant women with a Medicaid card; and children who have applied for Medicaid or [place] Health Choice, but were declined, whose parents can pay a reduced fee at time of service.  [facility] Department of [facility]  [address], [place] [phone] Accepts children up to age 78 who are enrolled in [place] or [place]; pregnant women with a Medicaid card; and children who have applied for Medicaid or [place] Health Choice, but were declined, whose parents can pay a reduced fee at time of service.  [place] Adult Dental Access PROGRAM  [place] [phone] Patients are seen by appointment only. Walk-ins are not accepted. [place] will see patients 72 years of age and older. Monday - Tuesday (8am-5pm) Most Wednesdays (8:30-5pm) $30 per visit, cash only  [facility] Adult Dental Access PROGRAM  [address], [facility] [phone] Patients are seen by appointment only. Walk-ins are not accepted. [place] will see patients 79 years of age and older. One Wednesday Evening (Monthly: Volunteer Based).  $30 per visit, cash only  [place]  [phone] for adults; Children under age 54, call Graduate  Pediatric Dentistry at [phone]. Children aged 27-14, please call [phone] to request a pediatric application.  Dental services are provided in all areas of dental care including fillings, crowns and bridges, complete and partial dentures, implants, gum treatment, root canals, and extractions. Preventive care is also provided. Treatment is provided to both adults and children. Patients are selected via a lottery and there is often a waiting list.   [facility] [address], [place]  [phone] [email or web address]  [facility] [address] [place], [place] [phone], Ext. [phone] Second and Fourth Thursday of each month, opens at 6:30 AM; Clinic ends at 9 AM.  Patients are seen on a first-come first-served basis, and a limited number are seen during each clinic.   [facility]  [address] Hillard Danker [place], [place] [phone]   Eligibility Requirements You must have lived in [place], [place], or [place] counties for at least the last three months.   You cannot be eligible for state or federal sponsored [employer], including [employer], [place], or [employer].   You generally cannot be eligible for healthcare insurance through your employer.    How to apply: Eligibility screenings are held every Tuesday and Wednesday afternoon from 1:00 pm until 4:00 pm. You do not need an appointment for the interview!  [facility] [address] [place], [place]   [place]  [place] Department  [place]  [phone]    Behavioral Health Resources in the Community: Intensive Outpatient Programs Organization         Address  Phone  Notes  [place]. [address], [place], [place] [phone]   [facility]patient [address] [place], [place]   ADS: Alcohol & Drug Svcs [address] [place], [place]   [place] [address]  [place], [place] or [phone]   Substance Abuse Resources Organization         Address  Phone  Notes  Alcohol and Drug Services  [phone]   [place]  [phone]   [place]   Chinita Pester  [phone]   Residential & Outpatient Substance Abuse Program  [phone]   Psychological Services Organization         Address  Phone  Notes  [facility]  [place]  [phone]   [place] [address], [place] or [phone]    Mobile Crisis Teams Organization         Address  Phone  Notes  [facility], Mobile Crisis Care Unit  [phone]   [facility]ive Psychotherapeutic Services  [address] [place], [place]   [place] [address], [place] [phone]    Self-Help/Support Groups Organization         Address  Phone             Notes  [place]. of [place] - variety of support groups  [place] Call for more information  Narcotics Anonymous (NA), [facility] [address], [employer] [place]  2 meetings at this location   Special educational needs teacher         Address  Phone  Notes  ASAP Residential Treatment [place],    [place]  [phone]   [facility]  [address] [place] [phone], [place], [place]   [place], [place] [phone] Admissions: 8am-3pm M-F  Incentives Substance [place] [address],    [place], [place] [phone]   [facility] [address] Jadene Pierini [place], [place]   [facility] [address]  [place], [place]   [facility] Programs - Intensive Outpatient [place]  [address], [place], [place]   [facility] ([place] [address]  [place], [place] [phone] or [phone]   [facility]idential Treatment Services (RTS) [address] [place], [place] Accepts Medicaid  [facility] [place] [address],  [place] [phone] Substance Abuse/Addiction Treatment   [facility] Organization         Address  Phone  Notes  [facility]  [phone]   Domenic Schwab, PhD [address] Arlis Porta [place], [place]   [phone] or [phone]   [place], [place] [phone]   [address], [place], [place] [phone] Insurance/Medicaid/sponsorship through [facility] and Families [address]                                    [place], [place] [phone] [place] [address], [place] [phone]    Dr. Adele Schilder  [phone]   [facility] [place] Dept. 1) [address] [place] 2) [place] 3)  [place] [address], [place] [phone]  [phone]   [place] [phone] or [phone] (After Hours)

## 2015-02-03 NOTE — ED Provider Notes (Signed)
CSN: [id number]     Arrival date & time 02/03/15  2048 History   By signing my name below, I, Forrestine Him, attest that this documentation has been prepared under the direction and in the presence of Charlesetta Shanks, MD.  Electronically Signed: Forrestine Him, ED Scribe. 02/03/2015. 11:20 PM.   Chief Complaint  Patient presents with  . Abscess   The history is provided by the patient. No language interpreter was used.    HPI Comments: Valerie Kerr is a 54 y.o. female with a PMHx of HTN who presents to the Emergency Department here for an abscess to the R axilla x 1 month that has worsened in the last 1-2 days. No recent drainage from the area. Pain to axilla is made worse with palpation. No alleviating factors at this time. Ongoing nausea, frontal HA, and cough also reported. Patient poor she does have problems with frequent sinusitis and also works in a dusty environment that I exacerbates her symptoms. No warm compresses attempted to area. However, OTC Aleve attempted with no relief. Denies any recent fever, chills, or vomiting. Last antibiotic use a few months ago for a recurrent abscess.  PCP: No PCP Per Patient    Past Medical History  Diagnosis Date  . Hypertension   . Acid reflux    Past Surgical History  Procedure Laterality Date  . Cervical disc surgery     No family history on file. Social History  Substance Use Topics  . Smoking status: Current Every Day Smoker    Types: Cigarettes  . Smokeless tobacco: None  . Alcohol Use: No   OB History    No data available     Review of Systems  A complete 10 system review of systems was obtained and all systems are negative except as noted in the HPI and PMH.    Allergies  Fentanyl; Ibuprofen; Tramadol; and Tylenol  Home Medications   Prior to Admission medications   Medication Sig Start Date End Date Taking? Authorizing Provider  amoxicillin (AMOXIL) 500 MG capsule Take 1 capsule (500 mg total) by mouth 3 (three)  times daily. Patient not taking: Reported on 11/28/2014 11/04/14   Ashley Murrain, NP  doxycycline (VIBRAMYCIN) 100 MG capsule Take 1 capsule (100 mg total) by mouth 2 (two) times daily. 11/28/14   Peggy Constant, MD  HYDROcodone-acetaminophen (NORCO) 5-325 MG per tablet Take 1 tablet by mouth every 6 (six) hours as needed. Patient not taking: Reported on 11/28/2014 11/04/14   Ashley Murrain, NP  ibuprofen (ADVIL,MOTRIN) 800 MG tablet Take 1 tablet (800 mg total) by mouth 3 (three) times daily. 02/03/15   Charlesetta Shanks, MD  lisinopril-hydrochlorothiazide (PRINZIDE) 10-12.5 MG per tablet Take 1 tablet by mouth daily. 07/03/14   Nicole Pisciotta, PA-C  methocarbamol (ROBAXIN) 500 MG tablet Take 1 tablet (500 mg total) by mouth 2 (two) times daily. Patient not taking: Reported on 11/28/2014 08/29/14   Ottie Glazier, PA-C  nystatin-triamcinolone (MYCOLOG II) cream Apply to affected area daily Patient not taking: Reported on 11/28/2014 11/04/14   Ashley Murrain, NP  oxyCODONE-acetaminophen (PERCOCET/ROXICET) 5-325 MG per tablet Take 2 tablets by mouth every 4 (four) hours as needed for severe pain. Patient not taking: Reported on 11/28/2014 08/29/14   Ottie Glazier, PA-C  pseudoephedrine (SUDAFED) 30 MG tablet Take 1 tablet (30 mg total) by mouth every 6 (six) hours as needed for congestion. 11/04/14   Hope Bunnie Pion, NP  sulfamethoxazole-trimethoprim (BACTRIM DS,SEPTRA DS) 800-160 MG tablet Take 1  tablet by mouth 2 (two) times daily. 02/03/15 02/10/15  Charlesetta Shanks, MD   Triage Vitals: BP 146/90 mmHg  Pulse 80  Temp(Src) 98.5 F (36.9 C) (Oral)  Resp 18  Ht 5\' 1"  (1.549 m)  Wt 154 lb (69.854 kg)  BMI 29.11 kg/m2  SpO2 99%   Physical Exam  Constitutional: She is oriented to person, place, and time. She appears well-developed and well-nourished.  HENT:  Head: Normocephalic and atraumatic.  Right Ear: External ear normal.  Left Ear: External ear normal.  Mouth/Throat: Oropharynx is clear and moist.  Eyes: EOM are  normal.  Neck: Neck supple.  Cardiovascular: Normal rate, regular rhythm, normal heart sounds and intact distal pulses.   Pulmonary/Chest: Effort normal and breath sounds normal.  Abdominal: Soft. Bowel sounds are normal. She exhibits no distension. There is no tenderness.  Musculoskeletal: Normal range of motion. She exhibits tenderness. She exhibits no edema.  Axilla has a approximately 1 cm moderately indurated erythematous nodule. There are 2 smaller adjacent areas of erythema without fluctuance.  Neurological: She is alert and oriented to person, place, and time. She has normal strength. Coordination normal. GCS eye subscore is 4. GCS verbal subscore is 5. GCS motor subscore is 6.  Skin: Skin is warm, dry and intact.  Psychiatric: She has a normal mood and affect.    ED Course  Procedures (including critical care time)  DIAGNOSTIC STUDIES: Oxygen Saturation is 97% on RA, adequate by my interpretation.    COORDINATION OF CARE: 11:13 PM- Will send home with an antibiotic. Encouraged warm compresses daily. Discussed treatment plan with pt at bedside and pt agreed to plan.     Labs Review Labs Reviewed - No data to display  Imaging Review No results found. I have personally reviewed and evaluated these images and lab results as part of my medical decision-making.   EKG Interpretation None      MDM   Final diagnoses:  Abscess of axilla   Patient has a developing abscess in the right axilla. At this time there is not sufficient fluctuance for I&D. Patient describes previous abscess in the same area several months ago. Patient is nontoxic and alert. She will be started on Bactrim and ibuprofen for a pain. Patient reports frontal headache and sneezing with mild cough. At this time the symptoms are most likely allergic rhinitis. The patient is nontoxic without fever or other significant associated symptoms.  Charlesetta Shanks, MD 02/03/15 [phone]

## 2015-07-08 ENCOUNTER — Emergency Department (HOSPITAL_COMMUNITY): Payer: Self-pay

## 2015-07-08 ENCOUNTER — Encounter (HOSPITAL_COMMUNITY): Payer: Self-pay | Admitting: *Deleted

## 2015-07-08 ENCOUNTER — Emergency Department (HOSPITAL_COMMUNITY)
Admission: EM | Admit: 2015-07-08 | Discharge: 2015-07-08 | Disposition: A | Discharge status: Home / Self Care | Payer: Self-pay | Attending: Emergency Medicine | Admitting: Emergency Medicine

## 2015-07-08 DIAGNOSIS — R51 Headache: Secondary | ICD-10-CM | POA: Insufficient documentation

## 2015-07-08 DIAGNOSIS — Z792 Long term (current) use of antibiotics: Secondary | ICD-10-CM | POA: Insufficient documentation

## 2015-07-08 DIAGNOSIS — Z79899 Other long term (current) drug therapy: Secondary | ICD-10-CM | POA: Insufficient documentation

## 2015-07-08 DIAGNOSIS — K2970 Gastritis, unspecified, without bleeding: Secondary | ICD-10-CM | POA: Insufficient documentation

## 2015-07-08 DIAGNOSIS — K047 Periapical abscess without sinus: Secondary | ICD-10-CM | POA: Insufficient documentation

## 2015-07-08 DIAGNOSIS — Z791 Long term (current) use of non-steroidal anti-inflammatories (NSAID): Secondary | ICD-10-CM | POA: Insufficient documentation

## 2015-07-08 DIAGNOSIS — I10 Essential (primary) hypertension: Secondary | ICD-10-CM | POA: Insufficient documentation

## 2015-07-08 LAB — COMPREHENSIVE METABOLIC PANEL
ALBUMIN: 3.8 g/dL (ref 3.5–5.0)
ALT: 29 U/L (ref 14–54)
ANION GAP: 11 (ref 5–15)
AST: 25 U/L (ref 15–41)
Alkaline Phosphatase: 64 U/L (ref 38–126)
BILIRUBIN TOTAL: 0.6 mg/dL (ref 0.3–1.2)
BUN: 15 mg/dL (ref 6–20)
CHLORIDE: 97 mmol/L — AB (ref 101–111)
CO2: 30 mmol/L (ref 22–32)
Calcium: 9.4 mg/dL (ref 8.9–10.3)
Creatinine, Ser: 1.24 mg/dL — ABNORMAL HIGH (ref 0.44–1.00)
GFR calc Af Amer: 55 mL/min — ABNORMAL LOW (ref >60)
GFR, EST NON AFRICAN AMERICAN: 48 mL/min — AB (ref >60)
Glucose, Bld: 175 mg/dL — ABNORMAL HIGH (ref 65–99)
Potassium: 3.5 mmol/L (ref 3.5–5.1)
Sodium: 138 mmol/L (ref 135–145)
TOTAL PROTEIN: 7.6 g/dL (ref 6.5–8.1)

## 2015-07-08 LAB — CBC
HEMATOCRIT: 45.6 % (ref 36.0–46.0)
HEMOGLOBIN: 14.9 g/dL (ref 12.0–15.0)
MCH: 28.3 pg (ref 26.0–34.0)
MCHC: 32.7 g/dL (ref 30.0–36.0)
MCV: 86.5 fL (ref 78.0–100.0)
Platelets: 265 10*3/uL (ref 150–400)
RBC: 5.27 MIL/uL — ABNORMAL HIGH (ref 3.87–5.11)
RDW: 13.4 % (ref 11.5–15.5)
WBC: 15.1 10*3/uL — AB (ref 4.0–10.5)

## 2015-07-08 LAB — URINALYSIS, ROUTINE W REFLEX MICROSCOPIC
Bilirubin Urine: NEGATIVE
GLUCOSE, UA: NEGATIVE mg/dL
Ketones, ur: 15 mg/dL — AB
Nitrite: NEGATIVE
Protein, ur: NEGATIVE mg/dL
SPECIFIC GRAVITY, URINE: 1.019 (ref 1.005–1.030)
pH: 5.5 (ref 5.0–8.0)

## 2015-07-08 LAB — URINE MICROSCOPIC-ADD ON

## 2015-07-08 LAB — LIPASE, BLOOD: LIPASE: 19 U/L (ref 11–51)

## 2015-07-08 MED ORDER — ACETAMINOPHEN 325 MG PO TABS
650.0000 mg | ORAL_TABLET | Freq: Once | ORAL | Status: AC
  Administered 2015-07-08: 650 mg via ORAL
  Filled 2015-07-08: qty 2

## 2015-07-08 MED ORDER — OMEPRAZOLE 20 MG PO CPDR: 20.0000 mg | DELAYED_RELEASE_CAPSULE | Freq: Every day | ORAL | Status: DC

## 2015-07-08 MED ORDER — IOPAMIDOL (ISOVUE-300) INJECTION 61%
INTRAVENOUS | Status: AC
  Administered 2015-07-08: 75 mL
  Filled 2015-07-08: qty 75

## 2015-07-08 MED ORDER — PENICILLIN V POTASSIUM 500 MG PO TABS: 500.0000 mg | ORAL_TABLET | Freq: Four times a day (QID) | ORAL | Status: AC

## 2015-07-08 NOTE — ED Notes (Signed)
Pt with a abscess area above lip.

## 2015-07-08 NOTE — ED Notes (Signed)
Pt reports onset Monday of facial swelling, has broken front tooth. Airway intact. Also having left side back pain that radiates around to left abd. Denies n/v/d or urinary symptoms.

## 2015-07-08 NOTE — ED Provider Notes (Signed)
CSN: [id number]     Arrival date & time 07/08/15  1304 History  By signing my name below, I, Eustaquio Maize, attest that this documentation has been prepared under the direction and in the presence of [employer], PA-C. Electronically Signed: Eustaquio Maize, ED Scribe. 07/08/2015. 3:54 PM.   Chief Complaint  Patient presents with  . Abdominal Pain  . Facial Swelling   The history is provided by the patient. No language interpreter was used.     HPI Comments: Valerie Kerr is a 55 y.o. female who presents to the Emergency Department complaining of gradual onset, constant, left upper dental pain and facial swelling x 2 days. Pt has had a fractured tooth in the area for awhile but has not been having much pain until 2 days ago. Pt has been taking unprescribed left over Penicillin from her niece for the past 2 days with some relief. She reports taking Motrin for the pain without relief. Pt has been told in the past by her PCP that she should not be taking Motrin or Tylenol due to it upsetting her stomach. She currently complains of intermittent, sharp and shooting, LUQ abdominal pain that she describes as indigestion. She attributes the pain from taking Motrin. Pt has been taking OTC gas pills without relief. She also complains of generalized weakness, nausea, and a headache. Denies vomiting, diarrhea, urinary issues, or any other associated symptoms. Pt does not currently have a dentist.   Past Medical History  Diagnosis Date  . Hypertension   . Acid reflux    Past Surgical History  Procedure Laterality Date  . Cervical disc surgery     History reviewed. No pertinent family history. Social History  Substance Use Topics  . Smoking status: Current Every Day Smoker    Types: Cigarettes  . Smokeless tobacco: None  . Alcohol Use: No   OB History    No data available     Review of Systems  HENT: Positive for dental problem and facial swelling.   Gastrointestinal: Positive for nausea and  abdominal pain. Negative for vomiting and diarrhea.  Neurological: Positive for weakness (generalized) and headaches.  All other systems reviewed and are negative.  Allergies  Fentanyl; Ibuprofen; Tramadol; and Tylenol  Home Medications   Prior to Admission medications   Medication Sig Start Date End Date Taking? Authorizing Provider  amoxicillin (AMOXIL) 500 MG capsule Take 1 capsule (500 mg total) by mouth 3 (three) times daily. Patient not taking: Reported on 11/28/2014 11/04/14   Ashley Murrain, NP  doxycycline (VIBRAMYCIN) 100 MG capsule Take 1 capsule (100 mg total) by mouth 2 (two) times daily. 11/28/14   Peggy Constant, MD  HYDROcodone-acetaminophen (NORCO) 5-325 MG per tablet Take 1 tablet by mouth every 6 (six) hours as needed. Patient not taking: Reported on 11/28/2014 11/04/14   Ashley Murrain, NP  ibuprofen (ADVIL,MOTRIN) 800 MG tablet Take 1 tablet (800 mg total) by mouth 3 (three) times daily. 02/03/15   Charlesetta Shanks, MD  lisinopril-hydrochlorothiazide (PRINZIDE) 10-12.5 MG per tablet Take 1 tablet by mouth daily. 07/03/14   Nicole Pisciotta, PA-C  methocarbamol (ROBAXIN) 500 MG tablet Take 1 tablet (500 mg total) by mouth 2 (two) times daily. Patient not taking: Reported on 11/28/2014 08/29/14   Ottie Glazier, PA-C  nystatin-triamcinolone (MYCOLOG II) cream Apply to affected area daily Patient not taking: Reported on 11/28/2014 11/04/14   Ashley Murrain, NP  omeprazole (PRILOSEC) 20 MG capsule Take 1 capsule (20 mg total) by mouth daily.  07/08/15   Okey Regal, PA-C  oxyCODONE-acetaminophen (PERCOCET/ROXICET) 5-325 MG per tablet Take 2 tablets by mouth every 4 (four) hours as needed for severe pain. Patient not taking: Reported on 11/28/2014 08/29/14   Ottie Glazier, PA-C  penicillin v potassium (VEETID) 500 MG tablet Take 1 tablet (500 mg total) by mouth 4 (four) times daily. 07/08/15 07/15/15  Okey Regal, PA-C  pseudoephedrine (SUDAFED) 30 MG tablet Take 1 tablet (30 mg total) by mouth  every 6 (six) hours as needed for congestion. 11/04/14   Hope Bunnie Pion, NP   BP 102/65 mmHg  Pulse 85  Temp(Src) 98.2 F (36.8 C) (Oral)  Resp 20  Ht 5\' 1"  (1.549 m)  Wt 68.266 kg  BMI 28.45 kg/m2  SpO2 95%   Physical Exam  Constitutional: She is oriented to person, place, and time. She appears well-developed and well-nourished. No distress.  HENT:  Head: Normocephalic.  Mouth/Throat: Uvula is midline, oropharynx is clear and moist and mucous membranes are normal. No oropharyngeal exudate, posterior oropharyngeal edema, posterior oropharyngeal erythema or tonsillar abscesses.  External exam shows no asymmetry of the jaw line or face, no signs of obvious swelling, edema, infection. Full active range of motion of the jaw. Neck is supple with full active range of motion, no tenderness to palpation of the soft tissues  Gumline palpated no obvious signs of infection including warmth, redness, tenderness. Pt has abscess with fluctuance noted over the left incisor which is fractured at the base. Posterior oropharynx clear with no signs of infection, uvula is midline and rises with phonation, tonsils present and normal in size, symmetrical bilateral, tongue is normal soft touch with full active range of motion, floor mouth is soft nontender.  Eyes: Conjunctivae are normal. Pupils are equal, round, and reactive to light. Right eye exhibits no discharge. Left eye exhibits no discharge.  Neck: Normal range of motion. Neck supple. No JVD present. No tracheal deviation present. No thyromegaly present.  Pulmonary/Chest: No stridor.  Abdominal: Soft. There is no tenderness. There is no CVA tenderness.  Lymphadenopathy:    She has no cervical adenopathy.  Neurological: She is alert and oriented to person, place, and time.  Skin: Skin is warm and dry. No rash noted. She is not diaphoretic. No erythema. No pallor.  Psychiatric: She has a normal mood and affect. Her behavior is normal. Judgment and thought  content normal.  Nursing note and vitals reviewed.   ED Course  Procedures (including critical care time)  INCISION AND DRAINAGE PROCEDURE NOTE: Patient identification was confirmed and verbal consent was obtained. This procedure was performed by Lenn Sink, PA-C at 7:15 PM. Site: Gumline above the left incisor Sterile procedures observed Blade size: 11 Drainage: Minimal purulent drainage approximately 1 CC Complexity: Complex Site anesthetized, incision made over site, wound drained and explored loculations, rinsed with copious amounts of normal saline. Pt tolerated procedure well without complications.  Instructions for care discussed verbally and pt provided with additional written instructions for homecare and f/u.  DIAGNOSTIC STUDIES: Oxygen Saturation is 95% on RA, normal by my interpretation.    COORDINATION OF CARE: 3:49 PM-Discussed treatment plan which includes CT Maxillofacial with pt at bedside and pt agreed to plan.   Labs Review Labs Reviewed  COMPREHENSIVE METABOLIC PANEL - Abnormal; Notable for the following:    Chloride 97 (*)    Glucose, Bld 175 (*)    Creatinine, Ser 1.24 (*)    GFR calc non Af Amer 48 (*)    GFR  calc Af Amer 55 (*)    All other components within normal limits  CBC - Abnormal; Notable for the following:    WBC 15.1 (*)    RBC 5.27 (*)    All other components within normal limits  URINALYSIS, ROUTINE W REFLEX MICROSCOPIC (NOT AT [facility]) - Abnormal; Notable for the following:    APPearance CLOUDY (*)    Hgb urine dipstick SMALL (*)    Ketones, ur 15 (*)    Leukocytes, UA LARGE (*)    All other components within normal limits  URINE MICROSCOPIC-ADD ON - Abnormal; Notable for the following:    Squamous Epithelial / LPF 0-5 (*)    Bacteria, UA MANY (*)    All other components within normal limits  URINE CULTURE  LIPASE, BLOOD  I-STAT BETA HCG BLOOD, ED ([facility], [facility], [facility] ONLY)    Imaging Review Ct Maxillofacial W/cm  07/08/2015  CLINICAL  DATA:  55 year old female with pain in the upper teeth, paranasal sinus region, left face for 2 days. Initial encounter. EXAM: CT MAXILLOFACIAL WITH CONTRAST TECHNIQUE: Multidetector CT imaging of the maxillofacial structures was performed with intravenous contrast. Multiplanar CT image reconstructions were also generated. A small metallic BB was placed on the right temple in order to reliably differentiate right from left. CONTRAST:  6mL ISOVUE-300 IOPAMIDOL (ISOVUE-300) INJECTION 61% COMPARISON:  None. FINDINGS: Negative visualized brain parenchyma. Major vascular structures in the neck and at the skullbase are patent. Negative visualized thyroid, larynx, pharynx, parapharyngeal spaces, retropharyngeal space, sublingual space, submandibular glands, and parotid glands. Visualized orbit soft tissues are within normal limits. Visualized scalp soft tissues are within normal limits. No pathologically enlarged cervical lymph nodes. Left level 1 B lymph nodes demonstrate mild asymmetrical enlargement. Normal fatty hila are maintained. Right level 2 B nodes are also slightly larger, but remain diminutive. Carious left maxillary medial incisor. Poor posterior right mandible dentition (Sagittal image 34). Poor dentition left mandible anterior bicuspid. Mild to moderate left maxillary sinus mucosal thickening. Trace paranasal sinus mucosal thickening otherwise. No sinus fluid levels. Tympanic cavities and mastoids are clear. Previous C5-C6 ACDF with interbody arthrodesis. No other osseous abnormality identified. No definite superficial face inflammatory stranding. IMPRESSION: 1. Intermittent carious/poor dentition including left maxillary medial incisor, left mandible anterior bicuspid, and right mandible anterior molar. 2. Mild to moderate left maxillary sinus mucosal thickening. Other paranasal sinuses are clear. 3. No areas of soft tissue infection identified by CT. Asymmetric but not pathologically enlarged left level 1  B lymph nodes might be reactive. Electronically Signed   By: Genevie Ann M.D.   On: 07/08/2015 17:13   I have personally reviewed and evaluated these images and lab results as part of my medical decision-making.   EKG Interpretation None      MDM   Final diagnoses:  Dental abscess  Gastritis    Labs: Urine microscopic, UA, Lipase, CMP, CBC, and beta hCG  Imaging: CT Maxillofacial   Consults: None  Therapeutics:  Discharge Meds: Penicillin  Assessment/Plan:55 year old female presents today with complaints of dental abscess and abdominal discomfort. Patient had obvious abscess over the left incisor. She did not appear to have any surrounding soft tissue disease, negative CT scan for spreading infection. Abscess was I&D. The ED with purulent discharge. Patient had been taking penicillin, she will continue on course of penicillin with follow-up with dentist for reevaluation and further management.  Patient also notes left upper quadrant and epigastric discomfort. She reports this is intermittent, sharp in nature. Patient notes  that she has been taking Tylenol and ibuprofen for the pain, she is not supposed to be taking these as the upset her stomach. Patient denies any CVA tenderness, numerous questioning of urine shows patient has no urinary complaints despite positive urinalysis. The patient has no fever, tachycardia, or signs of systemic infection, she does have a mild elevation in white blood cells. She had no significant tenderness to palpation of the abdomen or flank. Symptoms are most consistent with gastritis from pain medication, patient will be started on Prilosec. I have low suspicion for pyelonephritis in this patient. Patient will be instructed to follow-up with her primary care provider for reevaluation, dentist for dental pain, and return to the emergency room if any new or worsening signs or symptoms present. Patient verbalized understanding and agreement today's plan had no  further questions or concerns at time of discharge    I personally performed the services described in this documentation, which was scribed in my presence. The recorded information has been reviewed and is accurate.      Okey Regal, PA-C 07/08/15 2006  Orlie Dakin, MD 07/09/15 [phone]

## 2015-07-08 NOTE — ED Notes (Signed)
Pt complaining of left upper quad pain and radiates around to back and reported to [facility], APP.

## 2015-07-08 NOTE — Discharge Instructions (Signed)
Dental Abscess A dental abscess is a collection of pus in or around a tooth. CAUSES This condition is caused by a bacterial infection around the root of the tooth that involves the inner part of the tooth (pulp). It may result from:  Severe tooth decay.  Trauma to the tooth that allows bacteria to enter into the pulp, such as a broken or chipped tooth.  Severe gum disease around a tooth. SYMPTOMS Symptoms of this condition include:  Severe pain in and around the infected tooth.  Swelling and redness around the infected tooth, in the mouth, or in the face.  Tenderness.  Pus drainage.  Bad breath.  Bitter taste in the mouth.  Difficulty swallowing.  Difficulty opening the mouth.  Nausea.  Vomiting.  Chills.  Swollen neck glands.  Fever. DIAGNOSIS This condition is diagnosed with examination of the infected tooth. During the exam, your dentist may tap on the infected tooth. Your dentist will also ask about your medical and dental history and may order X-rays. TREATMENT This condition is treated by eliminating the infection. This may be done with:  Antibiotic medicine.  A root canal. This may be performed to save the tooth.  Pulling (extracting) the tooth. This may also involve draining the abscess. This is done if the tooth cannot be saved. HOME CARE INSTRUCTIONS  Take medicines only as directed by your dentist.  If you were prescribed antibiotic medicine, finish all of it even if you start to feel better.  Rinse your mouth (gargle) often with salt water to relieve pain or swelling.  Do not drive or operate heavy machinery while taking pain medicine.  Do not apply heat to the outside of your mouth.  Keep all follow-up visits as directed by your dentist. This is important. SEEK MEDICAL CARE IF:  Your pain is worse and is not helped by medicine. SEEK IMMEDIATE MEDICAL CARE IF:  You have a fever or chills.  Your symptoms suddenly get worse.  You have a  very bad headache.  You have problems breathing or swallowing.  You have trouble opening your mouth.  You have swelling in your neck or around your eye.   This information is not intended to replace advice given to you by your health care provider. Make sure you discuss any questions you have with your health care provider.   Document Released: 03/14/2005 Document Revised: 07/29/2014 Document Reviewed: 03/11/2014 Elsevier Interactive Patient Education 2016 [address] is a great source of information about community services available.  Access by dialing 2-1-1 from anywhere in [place], or by website -  Cust[employer]ian[employer].fi.   Other Local Resources (Updated 03/2015)  Dental  Care   Services    Phone Number and Address  Cost  [facility] For children 33 - 25 years of age:   Cleaning  Tooth brushing/flossing instruction  Sealants, fillings, crowns  Extractions  Emergency treatment  [phone] [address], [place] [address] Charges based on family income.  Medicaid and some insurance plans accepted.     [facility] - [facility], fillings, crowns  Extractions  Emergency treatment [phone] [address], [place]  Pregnant women 54 years of age or older with a Medicaid card  [facility] - [place]  Cleaning  Sealants, fillings, crowns  Extractions  Emergency treatment [phone] [place], [place] Pregnant  women 15 years of age or older with a Medicaid card  [facility] For children 46 - 72 years of age:   Cleaning  Tooth brushing/flossing instruction  Sealants, fillings, crowns  Extractions  Emergency treatment Limited orthodontic services for patients with Medicaid [phone], [place] [address] Medicaid and [facility] Health Choice cover for children up to age 36 and pregnant women.  Parents of children up to age 57 without Medicaid pay a reduced fee at time of service.  [place] For children 14 - 73 years of age:   Cleaning  Tooth brushing/flossing instruction  Sealants, fillings, crowns  Extractions  Emergency treatment Limited orthodontic services for patients with Medicaid [phone] [place], [place].  Medicaid and [place] Health Choice cover for children up to age 14 and pregnant women.  Parents of children up to age 46 without Medicaid pay a reduced fee.  [facility]  Sealants, fillings, crowns  Extractions  Hours: Tuesdays and Thursdays, 4:15 - 8 pm [phone] [address] [place], [place] [address] Services free of charge to [facility] residents ages 18-64 who do not have health insurance, Medicare, [place], or [place] benefits and fall within federal poverty guidelines  [place] care in addition to primary medical care, nutritional counseling, and pharmacy:  Engineer, drilling, fillings, crowns  Extractions                  [phone] [facility], [place], [place], [place], [place], [place] [facility], [place], [place] [facility] [place], [place], [place], most insurance.  Also provides services available to all with fees adjusted based on ability to pay.    [facility]  Cleaning  Tooth brushing/flossing  instruction  Sealants, fillings, crowns  Extractions  Emergency treatment Hours: Tuesdays, Thursdays, and Fridays from 8 am to 5 pm by appointment only. [phone] [address] [place], [place] [address] [facility] residents with Medicaid (depending on eligibility) and children with [facility] Health Choice - call for more information.  [facility]  Extractions only  Hours: 2nd and 4th Thursday of each month from 6:30 am - 9 am.   [phone] [address]. [address] [place], [place] [address] Ages 26 and older only.  Patients are seen on a first come, first served basis.  [employer]  [employer]  Extractions  Orthodontics  Endodontics  Implants/Crowns/Bridges  Complete and partial dentures [phone] [place], [place] Patients must complete an application for services.  There is often a waiting list.

## 2015-07-08 NOTE — ED Notes (Signed)
PT given graham crackers and ginger ale 

## 2015-07-08 NOTE — ED Notes (Signed)
Pt transported to CT ?

## 2015-07-08 NOTE — ED Notes (Signed)
EDP at bedside  

## 2015-08-12 ENCOUNTER — Emergency Department (HOSPITAL_COMMUNITY)
Admission: EM | Admit: 2015-08-12 | Discharge: 2015-08-12 | Disposition: A | Discharge status: Home / Self Care | Payer: Self-pay | Attending: Emergency Medicine | Admitting: Emergency Medicine

## 2015-08-12 ENCOUNTER — Encounter (HOSPITAL_COMMUNITY): Payer: Self-pay | Admitting: Emergency Medicine

## 2015-08-12 DIAGNOSIS — Z720 Tobacco use: Secondary | ICD-10-CM

## 2015-08-12 DIAGNOSIS — J40 Bronchitis, not specified as acute or chronic: Secondary | ICD-10-CM

## 2015-08-12 DIAGNOSIS — R05 Cough: Secondary | ICD-10-CM

## 2015-08-12 DIAGNOSIS — K219 Gastro-esophageal reflux disease without esophagitis: Secondary | ICD-10-CM | POA: Insufficient documentation

## 2015-08-12 DIAGNOSIS — Z792 Long term (current) use of antibiotics: Secondary | ICD-10-CM | POA: Insufficient documentation

## 2015-08-12 DIAGNOSIS — F17210 Nicotine dependence, cigarettes, uncomplicated: Secondary | ICD-10-CM | POA: Insufficient documentation

## 2015-08-12 DIAGNOSIS — I10 Essential (primary) hypertension: Secondary | ICD-10-CM | POA: Insufficient documentation

## 2015-08-12 DIAGNOSIS — R059 Cough, unspecified: Secondary | ICD-10-CM

## 2015-08-12 DIAGNOSIS — M791 Myalgia: Secondary | ICD-10-CM | POA: Insufficient documentation

## 2015-08-12 DIAGNOSIS — J209 Acute bronchitis, unspecified: Secondary | ICD-10-CM | POA: Insufficient documentation

## 2015-08-12 DIAGNOSIS — Z791 Long term (current) use of non-steroidal anti-inflammatories (NSAID): Secondary | ICD-10-CM | POA: Insufficient documentation

## 2015-08-12 DIAGNOSIS — Z79899 Other long term (current) drug therapy: Secondary | ICD-10-CM | POA: Insufficient documentation

## 2015-08-12 MED ORDER — DOXYCYCLINE HYCLATE 100 MG PO TABS
100.0000 mg | ORAL_TABLET | Freq: Once | ORAL | Status: AC
  Administered 2015-08-12: 100 mg via ORAL
  Filled 2015-08-12: qty 1

## 2015-08-12 MED ORDER — DOXYCYCLINE HYCLATE 100 MG PO CAPS: 100.0000 mg | ORAL_CAPSULE | Freq: Two times a day (BID) | ORAL | Status: DC

## 2015-08-12 NOTE — Discharge Instructions (Signed)
Continue to stay well-hydrated. Continue to alternate between Tylenol and Ibuprofen for pain or fever. Use Mucinex for cough suppression/expectoration of mucus. May consider over-the-counter Benadryl or other antihistamine to decrease secretions and for watery itchy eyes. Take antibiotic as directed, avoid sun exposure while taking this antibiotic. STOP SMOKING! Followup with your primary care doctor in 5-7 days for recheck of ongoing symptoms, or with [facility] if you don't have a primary care doctor so you can establish care and recheck after today's visit. Return to emergency department for emergent changing or worsening of symptoms.   Cough, Adult A cough helps to clear your throat and lungs. A cough may last only 2-3 weeks (acute), or it may last longer than 8 weeks (chronic). Many different things can cause a cough. A cough may be a sign of an illness or another medical condition. HOME CARE  Pay attention to any changes in your cough.  Take medicines only as told by your doctor.  If you were prescribed an antibiotic medicine, take it as told by your doctor. Do not stop taking it even if you start to feel better.  Talk with your doctor before you try using a cough medicine.  Drink enough fluid to keep your pee (urine) clear or pale yellow.  If the air is dry, use a cold steam vaporizer or humidifier in your home.  Stay away from things that make you cough at work or at home.  If your cough is worse at night, try using extra pillows to raise your head up higher while you sleep.  Do not smoke, and try not to be around smoke. If you need help quitting, ask your doctor.  Do not have caffeine.  Do not drink alcohol.  Rest as needed. GET HELP IF:  You have new problems (symptoms).  You cough up yellow fluid (pus).  Your cough does not get better after 2-3 weeks, or your cough gets worse.  Medicine does not help your cough and you are not sleeping well.  You have  pain that gets worse or pain that is not helped with medicine.  You have a fever.  You are losing weight and you do not know why.  You have night sweats. GET HELP RIGHT AWAY IF:  You cough up blood.  You have trouble breathing.  Your heartbeat is very fast.   This information is not intended to replace advice given to you by your health care provider. Make sure you discuss any questions you have with your health care provider.   Document Released: 11/25/2010 Document Revised: 12/03/2014 Document Reviewed: 05/21/2014 Elsevier Interactive Patient Education 2016 [employer]  Upper Respiratory Infection, Adult Most upper respiratory infections (URIs) are caused by a virus. A URI affects the nose, throat, and upper air passages. The most common type of URI is often called "the common cold." HOME CARE   Take medicines only as told by your doctor.  Gargle warm saltwater or take cough drops to comfort your throat as told by your doctor.  Use a warm mist humidifier or inhale steam from a shower to increase air moisture. This may make it easier to breathe.  Drink enough fluid to keep your pee (urine) clear or pale yellow.  Eat soups and other clear broths.  Have a healthy diet.  Rest as needed.  Go back to work when your fever is gone or your doctor says it is okay.  You may need to stay home longer to  avoid giving your URI to others.  You can also wear a face mask and wash your hands often to prevent spread of the virus.  Use your inhaler more if you have asthma.  Do not use any tobacco products, including cigarettes, chewing tobacco, or electronic cigarettes. If you need help quitting, ask your doctor. GET HELP IF:  You are getting worse, not better.  Your symptoms are not helped by medicine.  You have chills.  You are getting more short of breath.  You have brown or red mucus.  You have yellow or brown discharge from your nose.  You have pain in your face,  especially when you bend forward.  You have a fever.  You have puffy (swollen) neck glands.  You have pain while swallowing.  You have white areas in the back of your throat. GET HELP RIGHT AWAY IF:   You have very bad or constant:  Headache.  Ear pain.  Pain in your forehead, behind your eyes, and over your cheekbones (sinus pain).  Chest pain.  You have long-lasting (chronic) lung disease and any of the following:  Wheezing.  Long-lasting cough.  Coughing up blood.  A change in your usual mucus.  You have a stiff neck.  You have changes in your:  Vision.  Hearing.  Thinking.  Mood. MAKE SURE YOU:   Understand these instructions.  Will watch your condition.  Will get help right away if you are not doing well or get worse.   This information is not intended to replace advice given to you by your health care provider. Make sure you discuss any questions you have with your health care provider.   Document Released: 08/31/2007 Document Revised: 07/29/2014 Document Reviewed: 06/19/2013 Elsevier Interactive Patient Education 2016 [employer].  Smoking Cessation, Tips for Success If you are ready to quit smoking, congratulations! You have chosen to help yourself be healthier. Cigarettes bring nicotine, tar, carbon monoxide, and other irritants into your body. Your lungs, heart, and blood vessels will be able to work better without these poisons. There are many different ways to quit smoking. Nicotine gum, nicotine patches, a nicotine inhaler, or nicotine nasal spray can help with physical craving. Hypnosis, support groups, and medicines help break the habit of smoking. WHAT THINGS CAN I DO TO MAKE QUITTING EASIER?  Here are some tips to help you quit for good:  Pick a date when you will quit smoking completely. Tell all of your friends and family about your plan to quit on that date.  Do not try to slowly cut down on the number of cigarettes you are smoking.  Pick a quit date and quit smoking completely starting on that day.  Throw away all cigarettes.   Clean and remove all ashtrays from your home, work, and car.  On a card, write down your reasons for quitting. Carry the card with you and read it when you get the urge to smoke.  Cleanse your body of nicotine. Drink enough water and fluids to keep your urine clear or pale yellow. Do this after quitting to flush the nicotine from your body.  Learn to predict your moods. Do not let a bad situation be your excuse to have a cigarette. Some situations in your life might tempt you into wanting a cigarette.  Never have "just one" cigarette. It leads to wanting another and another. Remind yourself of your decision to quit.  Change habits associated with smoking. If you smoked while driving or when  feeling stressed, try other activities to replace smoking. Stand up when drinking your coffee. Brush your teeth after eating. Sit in a different chair when you read the paper. Avoid alcohol while trying to quit, and try to drink fewer caffeinated beverages. Alcohol and caffeine may urge you to smoke.  Avoid foods and drinks that can trigger a desire to smoke, such as sugary or spicy foods and alcohol.  Ask people who smoke not to smoke around you.  Have something planned to do right after eating or having a cup of coffee. For example, plan to take a walk or exercise.  Try a relaxation exercise to calm you down and decrease your stress. Remember, you may be tense and nervous for the first 2 weeks after you quit, but this will pass.  Find new activities to keep your hands busy. Play with a pen, coin, or rubber band. Doodle or draw things on paper.  Brush your teeth right after eating. This will help cut down on the craving for the taste of tobacco after meals. You can also try mouthwash.   Use oral substitutes in place of cigarettes. Try using lemon drops, carrots, cinnamon sticks, or chewing gum. Keep them  handy so they are available when you have the urge to smoke.  When you have the urge to smoke, try deep breathing.  Designate your home as a nonsmoking area.  If you are a heavy smoker, ask your health care provider about a prescription for nicotine chewing gum. It can ease your withdrawal from nicotine.  Reward yourself. Set aside the cigarette money you save and buy yourself something nice.  Look for support from others. Join a support group or smoking cessation program. Ask someone at home or at work to help you with your plan to quit smoking.  Always ask yourself, "Do I need this cigarette or is this just a reflex?" Tell yourself, "Today, I choose not to smoke," or "I do not want to smoke." You are reminding yourself of your decision to quit.  Do not replace cigarette smoking with electronic cigarettes (commonly called e-cigarettes). The safety of e-cigarettes is unknown, and some may contain harmful chemicals.  If you relapse, do not give up! Plan ahead and think about what you will do the next time you get the urge to smoke. HOW WILL I FEEL WHEN I QUIT SMOKING? You may have symptoms of withdrawal because your body is used to nicotine (the addictive substance in cigarettes). You may crave cigarettes, be irritable, feel very hungry, cough often, get headaches, or have difficulty concentrating. The withdrawal symptoms are only temporary. They are strongest when you first quit but will go away within 10-14 days. When withdrawal symptoms occur, stay in control. Think about your reasons for quitting. Remind yourself that these are signs that your body is healing and getting used to being without cigarettes. Remember that withdrawal symptoms are easier to treat than the major diseases that smoking can cause.  Even after the withdrawal is over, expect periodic urges to smoke. However, these cravings are generally short lived and will go away whether you smoke or not. Do not smoke! WHAT RESOURCES  ARE AVAILABLE TO HELP ME QUIT SMOKING? Your health care provider can direct you to community resources or hospitals for support, which may include:  Group support.  Education.  Hypnosis.  Therapy.   This information is not intended to replace advice given to you by your health care provider. Make sure you discuss any questions  you have with your health care provider.   Document Released: 12/11/2003 Document Revised: 04/04/2014 Document Reviewed: 08/30/2012 Elsevier Interactive Patient Education [employer].

## 2015-08-12 NOTE — ED Notes (Signed)
Pt reports productive cough x 1 months. Pt alert x4. NAD at this time.

## 2015-08-12 NOTE — ED Provider Notes (Signed)
CSN: [id number]     Arrival date & time 08/12/15  1333 History  By signing my name below, I, Nicole Kindred, attest that this documentation has been prepared under the direction and in the presence of [name], [employer].   Electronically Signed: Nicole Kindred, ED Scribe 08/12/2015 at 2:19 PM.  Chief Complaint  Patient presents with  . Cough   Patient is a 55 y.o. female presenting with cough. The history is provided by the patient. No language interpreter was used.  Cough Cough characteristics:  Productive Sputum characteristics:  Green Severity:  Moderate Onset quality:  Gradual Duration:  4 weeks Timing:  Constant Progression:  Worsening Chronicity:  New Smoker: yes   Context: sick contacts and upper respiratory infection   Relieved by:  Nothing Worsened by:  Nothing tried Ineffective treatments: Alkaseltzer and nyquil. Associated symptoms: myalgias   Associated symptoms: no chest pain, no chills, no ear pain, no fever, no rhinorrhea, no shortness of breath, no sinus congestion, no sore throat and no wheezing   Risk factors: no recent travel    HPI Comments: Valerie Kerr is a 55 y.o. female with a PMHx of HTN, who presents to the Emergency Department complaining of gradual onset, intermittent, worsening, productive cough with green phlegm, ongoing for one month. She reports associated fatigue and generalized body aches. Pt has taken alka seltzer cold and nyquil with no relief to symptoms. Symptoms are worse at night time, but have no specific aggravating factors. She states +sick contact with her grandson. No other associated symptoms noted. No other worsening or alleviating factors noted. Pt denies fever, chills, rhinorrhea, sore throat, ear pain, ear drainage, congestion, wheezing, chest pain, shortness of breath, abdominal pain, nausea, vomiting, diarrhea, dysuria, hematuria, arthralgias, numbness, tingling, weakness, rash, or any other pertinent symptoms. Pt is a current  smoker.     Past Medical History  Diagnosis Date  . Hypertension   . Acid reflux    Past Surgical History  Procedure Laterality Date  . Cervical disc surgery     No family history on file. Social History  Substance Use Topics  . Smoking status: Current Every Day Smoker    Types: Cigarettes  . Smokeless tobacco: None  . Alcohol Use: No   OB History    No data available     Review of Systems  Constitutional: Positive for fatigue. Negative for fever and chills.  HENT: Negative for congestion, ear discharge, ear pain, rhinorrhea and sore throat.   Respiratory: Positive for cough. Negative for shortness of breath and wheezing.   Cardiovascular: Negative for chest pain.  Gastrointestinal: Negative for nausea, vomiting, abdominal pain, diarrhea and constipation.  Genitourinary: Negative for dysuria and hematuria.  Musculoskeletal: Positive for myalgias. Negative for arthralgias.  Skin: Negative for color change.  Allergic/Immunologic: Negative for immunocompromised state.  Neurological: Negative for weakness and numbness.  Psychiatric/Behavioral: Negative for confusion.   10 Systems reviewed and all are negative for acute change except as noted in the HPI.  Allergies  Fentanyl; Ibuprofen; Tramadol; and Tylenol  Home Medications   Prior to Admission medications   Medication Sig Start Date End Date Taking? Authorizing Provider  amoxicillin (AMOXIL) 500 MG capsule Take 1 capsule (500 mg total) by mouth 3 (three) times daily. Patient not taking: Reported on 11/28/2014 11/04/14   Ashley Murrain, NP  doxycycline (VIBRAMYCIN) 100 MG capsule Take 1 capsule (100 mg total) by mouth 2 (two) times daily. 11/28/14   [name], MD  HYDROcodone-acetaminophen (NORCO) 5-325 MG  per tablet Take 1 tablet by mouth every 6 (six) hours as needed. Patient not taking: Reported on 11/28/2014 11/04/14   Ashley Murrain, NP  ibuprofen (ADVIL,MOTRIN) 800 MG tablet Take 1 tablet (800 mg total) by mouth 3 (three)  times daily. 02/03/15   Charlesetta Shanks, MD  lisinopril-hydrochlorothiazide (PRINZIDE) 10-12.5 MG per tablet Take 1 tablet by mouth daily. 07/03/14   Nicole Pisciotta, PA-C  methocarbamol (ROBAXIN) 500 MG tablet Take 1 tablet (500 mg total) by mouth 2 (two) times daily. Patient not taking: Reported on 11/28/2014 08/29/14   Ottie Glazier, PA-C  nystatin-triamcinolone (MYCOLOG II) cream Apply to affected area daily Patient not taking: Reported on 11/28/2014 11/04/14   Ashley Murrain, NP  omeprazole (PRILOSEC) 20 MG capsule Take 1 capsule (20 mg total) by mouth daily. 07/08/15   Okey Regal, PA-C  oxyCODONE-acetaminophen (PERCOCET/ROXICET) 5-325 MG per tablet Take 2 tablets by mouth every 4 (four) hours as needed for severe pain. Patient not taking: Reported on 11/28/2014 08/29/14   Ottie Glazier, PA-C  pseudoephedrine (SUDAFED) 30 MG tablet Take 1 tablet (30 mg total) by mouth every 6 (six) hours as needed for congestion. 11/04/14   Hope Bunnie Pion, NP   BP 127/82 mmHg  Pulse 89  Temp(Src) 98.5 F (36.9 C) (Oral)  Resp 18  SpO2 96% Physical Exam  Constitutional: She is oriented to person, place, and time. Vital signs are normal. She appears well-developed and well-nourished.  Non-toxic appearance. No distress.  Afebrile, nontoxic, NAD  HENT:  Head: Normocephalic and atraumatic.  Mouth/Throat: Oropharynx is clear and moist and mucous membranes are normal.  Eyes: Conjunctivae and EOM are normal. Right eye exhibits no discharge. Left eye exhibits no discharge.  Neck: Normal range of motion. Neck supple.  Cardiovascular: Normal rate, regular rhythm, normal heart sounds and intact distal pulses.  Exam reveals no gallop and no friction rub.   No murmur heard. Pulmonary/Chest: Effort normal and breath sounds normal. No respiratory distress. She has no decreased breath sounds. She has no wheezes. She has no rhonchi. She has no rales.  CTAB in all lung fields, no w/r/r, no hypoxia or increased WOB, speaking in  full sentences, SpO2 96% on RA, intermittent cough during exam  Abdominal: Soft. Normal appearance and bowel sounds are normal. She exhibits no distension. There is no tenderness. There is no rigidity, no rebound, no guarding, no CVA tenderness, no tenderness at McBurney's point and negative Murphy's sign.  Musculoskeletal: Normal range of motion.  Neurological: She is alert and oriented to person, place, and time. She has normal strength. No sensory deficit.  Skin: Skin is warm, dry and intact. No rash noted.  Psychiatric: She has a normal mood and affect. Her behavior is normal.  Nursing note and vitals reviewed.   ED Course  Procedures (including critical care time) DIAGNOSTIC STUDIES: Oxygen Saturation is 96% on RA, adequate by my interpretation.    COORDINATION OF CARE: 2:14 PM Discussed treatment plan with pt at bedside and pt agreed to plan.  Labs Review Labs Reviewed - No data to display  Imaging Review No results found.   EKG Interpretation None      MDM   Final diagnoses:  Cough  Bronchitis  Tobacco use    55 y.o. female here with cough x1 month. Had URI which later resolved but cough persisted, and has gradually worsened. Some sputum production. No other symptoms. Lung exam clear, no focal consolidation auscultated. Discussed that it could still be post-viral cough, but  could also be a bacterial cause. Will not get CXR since this won't change my management, will treat empirically for bacterial causes. Will start on doxycycline since she can't remember if she's allergic to the other medications that would be appropriate, and has been on doxy before without any issue. Discussed OTC symptomatic control meds. F/up with [facility]/PCP in 1wk for recheck. Smoking cessation advised. I explained the diagnosis and have given explicit precautions to return to the ER including for any other new or worsening symptoms. The patient understands and accepts the medical plan as it's been  dictated and I have answered their questions. Discharge instructions concerning home care and prescriptions have been given. The patient is STABLE and is discharged to home in good condition.   I personally performed the services described in this documentation, which was scribed in my presence. The recorded information has been reviewed and is accurate.  BP 127/82 mmHg  Pulse 89  Temp(Src) 98.5 F (36.9 C) (Oral)  Resp 18  SpO2 96%  Meds ordered this encounter  Medications  . doxycycline (VIBRA-TABS) tablet 100 mg    Sig:   . doxycycline (VIBRAMYCIN) 100 MG capsule    Sig: Take 1 capsule (100 mg total) by mouth 2 (two) times daily. One po bid x 7 days    Dispense:  14 capsule    Refill:  0    Order Specific Question:  Supervising Provider    Answer:  Noemi Chapel [[id number]]     Maddeline Roorda Camprubi-Soms, PA-C 08/12/15 [place], [place] 08/12/15 [phone]

## 2015-12-24 ENCOUNTER — Encounter (HOSPITAL_COMMUNITY): Payer: Self-pay | Admitting: Physical Medicine and Rehabilitation

## 2015-12-24 ENCOUNTER — Emergency Department (HOSPITAL_COMMUNITY)
Admission: EM | Admit: 2015-12-24 | Discharge: 2015-12-24 | Disposition: A | Discharge status: Home / Self Care | Payer: Self-pay | Attending: Emergency Medicine | Admitting: Emergency Medicine

## 2015-12-24 ENCOUNTER — Emergency Department (HOSPITAL_COMMUNITY): Payer: Self-pay

## 2015-12-24 DIAGNOSIS — K029 Dental caries, unspecified: Secondary | ICD-10-CM | POA: Insufficient documentation

## 2015-12-24 DIAGNOSIS — J329 Chronic sinusitis, unspecified: Secondary | ICD-10-CM

## 2015-12-24 DIAGNOSIS — J321 Chronic frontal sinusitis: Secondary | ICD-10-CM | POA: Insufficient documentation

## 2015-12-24 DIAGNOSIS — I10 Essential (primary) hypertension: Secondary | ICD-10-CM | POA: Insufficient documentation

## 2015-12-24 DIAGNOSIS — K219 Gastro-esophageal reflux disease without esophagitis: Secondary | ICD-10-CM | POA: Insufficient documentation

## 2015-12-24 DIAGNOSIS — K047 Periapical abscess without sinus: Secondary | ICD-10-CM | POA: Insufficient documentation

## 2015-12-24 DIAGNOSIS — Z720 Tobacco use: Secondary | ICD-10-CM

## 2015-12-24 DIAGNOSIS — F17210 Nicotine dependence, cigarettes, uncomplicated: Secondary | ICD-10-CM | POA: Insufficient documentation

## 2015-12-24 DIAGNOSIS — J320 Chronic maxillary sinusitis: Secondary | ICD-10-CM | POA: Insufficient documentation

## 2015-12-24 DIAGNOSIS — G8929 Other chronic pain: Secondary | ICD-10-CM | POA: Insufficient documentation

## 2015-12-24 DIAGNOSIS — Z79899 Other long term (current) drug therapy: Secondary | ICD-10-CM | POA: Insufficient documentation

## 2015-12-24 DIAGNOSIS — M79601 Pain in right arm: Secondary | ICD-10-CM | POA: Insufficient documentation

## 2015-12-24 DIAGNOSIS — K30 Functional dyspepsia: Secondary | ICD-10-CM | POA: Insufficient documentation

## 2015-12-24 LAB — I-STAT TROPONIN, ED: Troponin i, poc: 0 ng/mL (ref 0.00–0.08)

## 2015-12-24 LAB — BASIC METABOLIC PANEL
Anion gap: 6 (ref 5–15)
BUN: 8 mg/dL (ref 6–20)
CHLORIDE: 100 mmol/L — AB (ref 101–111)
CO2: 31 mmol/L (ref 22–32)
Calcium: 9.4 mg/dL (ref 8.9–10.3)
Creatinine, Ser: 0.63 mg/dL (ref 0.44–1.00)
GFR calc Af Amer: >60 mL/min (ref >60)
GFR calc non Af Amer: >60 mL/min (ref >60)
GLUCOSE: 85 mg/dL (ref 65–99)
POTASSIUM: 3.7 mmol/L (ref 3.5–5.1)
SODIUM: 137 mmol/L (ref 135–145)

## 2015-12-24 LAB — CBC
HEMATOCRIT: 44.6 % (ref 36.0–46.0)
HEMOGLOBIN: 14.2 g/dL (ref 12.0–15.0)
MCH: 28 pg (ref 26.0–34.0)
MCHC: 31.8 g/dL (ref 30.0–36.0)
MCV: 88 fL (ref 78.0–100.0)
Platelets: 249 10*3/uL (ref 150–400)
RBC: 5.07 MIL/uL (ref 3.87–5.11)
RDW: 13.8 % (ref 11.5–15.5)
WBC: 10.2 10*3/uL (ref 4.0–10.5)

## 2015-12-24 MED ORDER — RANITIDINE HCL 150 MG PO TABS: 150.0000 mg | ORAL_TABLET | Freq: Two times a day (BID) | ORAL | 0 refills | Status: DC

## 2015-12-24 MED ORDER — NAPROXEN 500 MG PO TABS: 500.0000 mg | ORAL_TABLET | Freq: Two times a day (BID) | ORAL | 0 refills | Status: DC | PRN

## 2015-12-24 MED ORDER — PENICILLIN V POTASSIUM 500 MG PO TABS: 1000.0000 mg | ORAL_TABLET | Freq: Two times a day (BID) | ORAL | 0 refills | Status: DC

## 2015-12-24 MED ORDER — NAPROXEN 250 MG PO TABS
500.0000 mg | ORAL_TABLET | Freq: Once | ORAL | Status: AC
  Administered 2015-12-24: 500 mg via ORAL
  Filled 2015-12-24: qty 2

## 2015-12-24 NOTE — Discharge Instructions (Signed)
For your sinus infection: Continue to stay well-hydrated. Continue to alternate between Tylenol and Naprosyn for pain or fever. Use netipot and flonase to help with nasal congestion. May consider over-the-counter Benadryl or other antihistamine to decrease secretions and for watery itchy eyes.  For your dental infection: Apply warm compresses to jaw throughout the day. Take antibiotic until finished. Use tylenol or naprosyn as needed for pain. Perform salt water swishes to help with pain/swelling. Use over the counter oragel as needed for additional relief. Followup with a dentist is very important for ongoing evaluation and management of recurrent dental pain, call the dentist listed above in the next 24-48 hours to schedule ongoing dental care, or use the list below to find a dentist. STOP SMOKING!  For your indigestion: take zantac as directed for symptoms. Avoid spicy foods/fatty foods/fried foods, and avoid taking NSAIDs like ibuprofen or naprosyn on an empty stomach. Avoid alcohol or coffee/tea/soda. Avoid laying flat within 30 minutes of a meal.   Follow up with [place] [facility] in 1 week for recheck of symptoms and to establish medical care.  Return to emergency department for emergent changing or worsening symptoms.    Emergency Department Resource Guide 1) Find a Doctor and Pay Out of Pocket Although you won't have to find out who is covered by your insurance plan, it is a good idea to ask around and get recommendations. You will then need to call the office and see if the doctor you have chosen will accept you as a new patient and what types of options they offer for patients who are self-pay. Some doctors offer discounts or will set up payment plans for their patients who do not have insurance, but you will need to ask so you aren't surprised when you get to your appointment.  2) Contact Your Local Health Department Not all health departments have doctors that can see patients for  sick visits, but many do, so it is worth a call to see if yours does. If you don't know where your local health department is, you can check in your phone book. The CDC also has a tool to help you locate your state's health department, and many state websites also have listings of all of their local health departments.  3) Find a [facility] If your illness is not likely to be very severe or complicated, you may want to try a walk in clinic. These are popping up all over the country in pharmacies, drugstores, and shopping centers. They're usually staffed by nurse practitioners or physician assistants that have been trained to treat common illnesses and complaints. They're usually fairly quick and inexpensive. However, if you have serious medical issues or chronic medical problems, these are probably not your best option.  No Primary Care Doctor: Call Health Connect at  [phone] - they can help you locate a primary care doctor that  accepts your insurance, provides certain services, etc. Physician Referral Service- [phone]  Chronic Pain Problems: Organization         Address  Phone   Notes  [facility]  [phone] Patients need to be referred by their primary care doctor.   Medication Assistance: Organization         Address  Phone   Notes  [facility] Medication [facility] [address] [place], [place] [address] [phone] --Must be a resident of [facility] -- Must have NO insurance coverage whatsoever (no Medicaid/ Medicare, etc.) --  The pt. MUST have a primary care doctor that directs their care regularly and follows them in the community   [facility]  [phone]   [place]  [phone]     Dental Care: Organization         Address  Phone  Notes  [facility] Department of [facility] [phone] Accepts children up to age 79 who are enrolled in  [place] or [place]; pregnant women with a Medicaid card; and children who have applied for Medicaid or [place] Health Choice, but were declined, whose parents can pay a reduced fee at time of service.  [facility] Department of [facility]  [address], [place] [phone] Accepts children up to age 60 who are enrolled in [place] or [place]; pregnant women with a Medicaid card; and children who have applied for Medicaid or [place] Health Choice, but were declined, whose parents can pay a reduced fee at time of service.  [facility]  [place] [phone] Patients are seen by appointment only. Walk-ins are not accepted. [place] will see patients 18 years of age and older. Monday - Tuesday (8am-5pm) Most Wednesdays (8:30-5pm) $30 per visit, cash only  [facility] Adult Dental Access PROGRAM  [address], [facility] [phone] Patients are seen by appointment only. Walk-ins are not accepted. [place] will see patients 71 years of age and older. One Wednesday Evening (Monthly: Volunteer Based).  $30 per visit, cash only  [place]  [phone] for adults; Children under age 83, call Graduate Pediatric Dentistry at [phone]. Children aged 14-14, please call [phone] to request a pediatric application.  Dental services are provided in all areas of dental care including fillings, crowns and bridges, complete and partial dentures, implants, gum treatment, root canals, and extractions. Preventive care is also provided. Treatment is provided to both adults and children. Patients are selected via a lottery and there is often a waiting list.   [facility] [address] [place]  [phone] [email or web address]   [facility] [address] [place], [place] [phone], Ext. [phone] Second and Fourth Thursday of each month, opens at 6:30  AM; Clinic ends at 9 AM.  Patients are seen on a first-come first-served basis, and a limited number are seen during each clinic.   [facility]  [address] [place], [place] [phone]   Eligibility Requirements You must have lived in [place], [place], or [place] counties for at least the last three months.   You cannot be eligible for state or federal sponsored [employer], including [employer], [place], or [employer].   You generally cannot be eligible for healthcare insurance through your employer.    How to apply: Eligibility screenings are held every Tuesday and Wednesday afternoon from 1:00 pm until 4:00 pm. You do not need an appointment for the interview!  [facility] [address], [place], [place]   [place]  [place]  [place]  [phone]

## 2015-12-24 NOTE — ED Triage Notes (Signed)
Pt. reports intermittent central chest pain for 2 days with " gas and reflux" , denies SOB , no nausea or diaphoresis . Denies chest pain at triage .

## 2015-12-24 NOTE — ED Provider Notes (Signed)
[facility] DEPT Provider Note   CSN: [id number] Arrival date & time: 12/24/15  1845     History   Chief Complaint Chief Complaint  Patient presents with  . Arm Pain  . Dental Pain  . Chest Pain    HPI Valerie Kerr is a 55 y.o. female with a PMHx of GERD and HTN, and PSHx of cervical disc surgery, who presents to the ED with multiple complaints. Her primary complaint is 3 days of left lower dental pain and one week of sinus congestion and postnasal drip. She describes her dental pain as 10/10 constant aching nonradiating left lower dental pain, worse with chewing, and somewhat relieved with naproxen. Associated symptoms include gum swelling and postnasal drip/sinus congestion. She also reports chronic right arm pain which is unchanged from baseline. Additionally she reported to the triage nurse that she was having chest pain, but she states that she's just been having some "indigestion and reflux", states that she took an indigestion medication earlier and this has resolved, no longer having any indigestion, and denies having CP. She has no PCP, and is needing assistance in getting one. She also has no dentist. She continues to smoke cigarettes. Positive sick contacts with URI symptoms recently.  She denies any rhinorrhea, cough, trismus, drooling, gum drainage, ear pain or drainage, fevers, chills, chest pain, shortness breath, abdominal pain, nausea, vomiting, diarrhea, constipation, dysuria, hematuria, numbness, tingling, focal weakness, or recent travel.   The history is provided by the patient and medical records. No language interpreter was used.  Dental Pain   This is a new problem. The current episode started more than 2 days ago. The problem occurs constantly. The problem has not changed since onset.The pain is at a severity of 10/10. The pain is severe. Treatments tried: naprosyn. The treatment provided mild relief.    Past Medical History:  Diagnosis Date  . Acid reflux    . Hypertension     There are no active problems to display for this patient.   Past Surgical History:  Procedure Laterality Date  . CERVICAL DISC SURGERY      OB History    No data available       Home Medications    Prior to Admission medications   Medication Sig Start Date End Date Taking? Authorizing Provider  amoxicillin (AMOXIL) 500 MG capsule Take 1 capsule (500 mg total) by mouth 3 (three) times daily. Patient not taking: Reported on 11/28/2014 11/04/14   Ashley Murrain, NP  doxycycline (VIBRAMYCIN) 100 MG capsule Take 1 capsule (100 mg total) by mouth 2 (two) times daily. 11/28/14   Peggy Constant, MD  doxycycline (VIBRAMYCIN) 100 MG capsule Take 1 capsule (100 mg total) by mouth 2 (two) times daily. One po bid x 7 days 08/12/15   Mykenna Viele Camprubi-Soms, PA-C  HYDROcodone-acetaminophen (NORCO) 5-325 MG per tablet Take 1 tablet by mouth every 6 (six) hours as needed. Patient not taking: Reported on 11/28/2014 11/04/14   Ashley Murrain, NP  ibuprofen (ADVIL,MOTRIN) 800 MG tablet Take 1 tablet (800 mg total) by mouth 3 (three) times daily. 02/03/15   Charlesetta Shanks, MD  lisinopril-hydrochlorothiazide (PRINZIDE) 10-12.5 MG per tablet Take 1 tablet by mouth daily. 07/03/14   Nicole Pisciotta, PA-C  methocarbamol (ROBAXIN) 500 MG tablet Take 1 tablet (500 mg total) by mouth 2 (two) times daily. Patient not taking: Reported on 11/28/2014 08/29/14   Ottie Glazier, PA-C  nystatin-triamcinolone (MYCOLOG II) cream Apply to affected area daily Patient not taking: Reported  on 11/28/2014 11/04/14   Hope Bunnie Pion, NP  omeprazole (PRILOSEC) 20 MG capsule Take 1 capsule (20 mg total) by mouth daily. 07/08/15   Okey Regal, PA-C  oxyCODONE-acetaminophen (PERCOCET/ROXICET) 5-325 MG per tablet Take 2 tablets by mouth every 4 (four) hours as needed for severe pain. Patient not taking: Reported on 11/28/2014 08/29/14   Ottie Glazier, PA-C  pseudoephedrine (SUDAFED) 30 MG tablet Take 1 tablet (30 mg total) by  mouth every 6 (six) hours as needed for congestion. 11/04/14   Hope Bunnie Pion, NP    Family History No family history on file.  Social History Social History  Substance Use Topics  . Smoking status: Current Every Day Smoker    Types: Cigarettes  . Smokeless tobacco: Never Used  . Alcohol use No     Allergies   Fentanyl; Ibuprofen; Tramadol; and Tylenol [acetaminophen]   Review of Systems Review of Systems  Constitutional: Negative for chills and fever.  HENT: Positive for congestion, dental problem, postnasal drip and sinus pressure. Negative for drooling, ear discharge, ear pain, rhinorrhea, sore throat and trouble swallowing.   Respiratory: Negative for cough and shortness of breath.   Cardiovascular: Negative for chest pain.  Gastrointestinal: Negative for abdominal pain, constipation, diarrhea, nausea and vomiting.       +reflux/indigestion earlier, currently resolved  Genitourinary: Negative for dysuria and hematuria.  Musculoskeletal: Positive for myalgias (R arm pain, chronic, unchanged). Negative for arthralgias.  Skin: Negative for color change.  Allergic/Immunologic: Negative for immunocompromised state.  Neurological: Negative for weakness and numbness.  Psychiatric/Behavioral: Negative for confusion.   10 Systems reviewed and are negative for acute change except as noted in the HPI.   Physical Exam Updated Vital Signs BP 143/97 (BP Location: Left Arm)   Pulse 88   Temp 98 F (36.7 C) (Oral)   Resp 16   SpO2 96%   Physical Exam  Constitutional: She is oriented to person, place, and time. Vital signs are normal. She appears well-developed and well-nourished.  Non-toxic appearance. No distress.  Afebrile, nontoxic, NAD  HENT:  Head: Normocephalic and atraumatic.  Nose: Mucosal edema and rhinorrhea present. Right sinus exhibits maxillary sinus tenderness and frontal sinus tenderness. Left sinus exhibits maxillary sinus tenderness and frontal sinus tenderness.    Mouth/Throat: Uvula is midline, oropharynx is clear and moist and mucous membranes are normal. No trismus in the jaw. Dental caries present. No dental abscesses or uvula swelling. Tonsils are 0 on the right. Tonsils are 0 on the left. No tonsillar exudate.    L lower tooth #21 decayed with mild surrounding gingival erythema and swelling, no discrete abscess, no evidence of ludwig's, moderate TTP to the gumline in this area. Nose with mucosal edema and rhinorrhea, diffuse sinus TTP. Oropharynx clear and moist, without uvular swelling or deviation, no trismus or drooling, no tonsillar swelling or erythema, no exudates.    Eyes: Conjunctivae and EOM are normal. Right eye exhibits no discharge. Left eye exhibits no discharge.  Neck: Normal range of motion. Neck supple.  Cardiovascular: Normal rate, regular rhythm, normal heart sounds and intact distal pulses.  Exam reveals no gallop and no friction rub.   No murmur heard. RRR, nl s1/s2, no m/r/g, distal pulses intact, no pedal edema   Pulmonary/Chest: Effort normal and breath sounds normal. No respiratory distress. She has no decreased breath sounds. She has no wheezes. She has no rhonchi. She has no rales.  CTAB in all lung fields, no w/r/r, no hypoxia or increased WOB,  speaking in full sentences, SpO2 99% on RA  Abdominal: Soft. Normal appearance and bowel sounds are normal. She exhibits no distension. There is no tenderness. There is no rigidity, no rebound, no guarding, no CVA tenderness, no tenderness at McBurney's point and negative Murphy's sign.  Musculoskeletal: Normal range of motion.  R wrist velcro splint present MAE x4 Strength and sensation grossly intact Distal pulses intact Gait steady No pedal edema, neg homan's bilaterally   Neurological: She is alert and oriented to person, place, and time. She has normal strength. No sensory deficit.  Skin: Skin is warm, dry and intact. No rash noted.  Psychiatric: She has a normal mood and  affect.  Nursing note and vitals reviewed.    ED Treatments / Results  Labs (all labs ordered are listed, but only abnormal results are displayed) Labs Reviewed  BASIC METABOLIC PANEL - Abnormal; Notable for the following:       Result Value   Chloride 100 (*)    All other components within normal limits  CBC  I-STAT TROPOININ, ED    EKG  EKG Interpretation  Date/Time:  Thursday December 24 2015 20:43:55 EDT Ventricular Rate:  82 PR Interval:  138 QRS Duration: 80 QT Interval:  388 QTC Calculation: 453 R Axis:   77 Text Interpretation:  Normal sinus rhythm Possible Left atrial enlargement Borderline ECG No prior EKG.  Confirmed by LIU MD, DANA [phone]) on 12/24/2015 10:37:02 PM       Radiology Dg Chest 2 View  Result Date: 12/24/2015 CLINICAL DATA:  55 year old current smoker presenting with acute onset of chest pain and tightness. Current history of hypertension. EXAM: CHEST  2 VIEW COMPARISON:  None. FINDINGS: Cardiac silhouette upper normal in size. Hilar and mediastinal contours otherwise unremarkable. Lungs clear. Bronchovascular markings normal. Pulmonary vascularity normal. No visible pleural effusions. No pneumothorax. Visualized bony thorax intact. Prior lower cervical spine fusion. IMPRESSION: Borderline heart size.  No acute cardiopulmonary disease. Electronically Signed   By: Evangeline Dakin M.D.   On: 12/24/2015 21:40    Procedures Procedures (including critical care time)  Medications Ordered in ED Medications  naproxen (NAPROSYN) tablet 500 mg (500 mg Oral Given 12/24/15 2245)     Initial Impression / Assessment and Plan / ED Course  I have reviewed the triage vital signs and the nursing notes.  Pertinent labs & imaging results that were available during my care of the patient were reviewed by me and considered in my medical decision making (see chart for details).  Clinical Course    55 y.o. female here with multiple complaints, primarily sinus  infection and L lower dental pain x3 days, and additionally some R arm pain that's chronic but not the main reason she came to the ED today. When she arrived here she told them she had CP, but she tells me she was just having indigestion and postnasal drip but that it resolved entirely after she took indigestion medication and she is no longer having CP. +Smoker, smoking cessation advised. +Sick contacts recently. On exam, sinus congestion and tenderness, throat clear, no evidence of ludwig's or PTA, L lower dental decay and surrounding gingival erythema and developing infection/early abscess, no area that would be amendable to I&D but appears to be a developing abscess. Trop neg, CBC and BMP WNL, CXR neg. EKG nonischemic. Overall, seems that she has a viral URI/sinus infection and developing dental infection, and indigestion per her account. Doubt need for further emergent work up at this time. She  has no PCP, will refer to [facility] for establishing medical care in 1wk and to recheck symptoms. Dentist f/up given and discussed importance of this. Smoking cessation encouraged. OTC meds for pain/symptom relief. Rx for PCN VK, naprosyn, and zantac given. Diet modifications for GERD discussed. I explained the diagnosis and have given explicit precautions to return to the ER including for any other new or worsening symptoms. The patient understands and accepts the medical plan as it's been dictated and I have answered their questions. Discharge instructions concerning home care and prescriptions have been given. The patient is STABLE and is discharged to home in good condition.   Final Clinical Impressions(s) / ED Diagnoses   Final diagnoses:  Pain due to dental caries  Dental abscess  Chronic pain of right upper extremity  Indigestion  Gastroesophageal reflux disease, esophagitis presence not specified  Sinusitis, unspecified chronicity, unspecified location  Tobacco user    New Prescriptions New  Prescriptions   NAPROXEN (NAPROSYN) 500 MG TABLET    Take 1 tablet (500 mg total) by mouth 2 (two) times daily as needed for mild pain, moderate pain or headache (TAKE WITH MEALS.).   PENICILLIN V POTASSIUM (VEETID) 500 MG TABLET    Take 2 tablets (1,000 mg total) by mouth 2 (two) times daily. X 7 days   RANITIDINE (ZANTAC) 150 MG TABLET    Take 1 tablet (150 mg total) by mouth 2 (two) times daily.     Jaretzy Lhommedieu Camprubi-Soms, PA-C 12/24/15 [place] Liu, MD 12/26/15 [phone]

## 2015-12-24 NOTE — ED Triage Notes (Signed)
Pt states chronic nerve pain to R arm. Ongoing for several months. Pt also states L lower dental pain.

## 2016-02-02 ENCOUNTER — Ambulatory Visit: Payer: Self-pay | Admitting: Internal Medicine

## 2016-02-20 ENCOUNTER — Emergency Department (HOSPITAL_COMMUNITY)
Admission: EM | Admit: 2016-02-20 | Discharge: 2016-02-21 | Disposition: A | Discharge status: Home / Self Care | Payer: Self-pay | Attending: Emergency Medicine | Admitting: Emergency Medicine

## 2016-02-20 ENCOUNTER — Encounter (HOSPITAL_COMMUNITY): Payer: Self-pay | Admitting: Emergency Medicine

## 2016-02-20 DIAGNOSIS — G8929 Other chronic pain: Secondary | ICD-10-CM | POA: Insufficient documentation

## 2016-02-20 DIAGNOSIS — M25562 Pain in left knee: Secondary | ICD-10-CM | POA: Insufficient documentation

## 2016-02-20 DIAGNOSIS — I10 Essential (primary) hypertension: Secondary | ICD-10-CM | POA: Insufficient documentation

## 2016-02-20 DIAGNOSIS — R221 Localized swelling, mass and lump, neck: Secondary | ICD-10-CM | POA: Insufficient documentation

## 2016-02-20 DIAGNOSIS — N888 Other specified noninflammatory disorders of cervix uteri: Secondary | ICD-10-CM

## 2016-02-20 DIAGNOSIS — F17210 Nicotine dependence, cigarettes, uncomplicated: Secondary | ICD-10-CM | POA: Insufficient documentation

## 2016-02-20 DIAGNOSIS — M25511 Pain in right shoulder: Secondary | ICD-10-CM | POA: Insufficient documentation

## 2016-02-20 DIAGNOSIS — M5441 Lumbago with sciatica, right side: Secondary | ICD-10-CM | POA: Insufficient documentation

## 2016-02-20 DIAGNOSIS — Z79899 Other long term (current) drug therapy: Secondary | ICD-10-CM | POA: Insufficient documentation

## 2016-02-20 LAB — I-STAT CHEM 8, ED
BUN: 10 mg/dL (ref 6–20)
CHLORIDE: 99 mmol/L — AB (ref 101–111)
CREATININE: 0.6 mg/dL (ref 0.44–1.00)
Calcium, Ion: 1.17 mmol/L (ref 1.15–1.40)
GLUCOSE: 80 mg/dL (ref 65–99)
HEMATOCRIT: 40 % (ref 36.0–46.0)
HEMOGLOBIN: 13.6 g/dL (ref 12.0–15.0)
POTASSIUM: 3.6 mmol/L (ref 3.5–5.1)
Sodium: 140 mmol/L (ref 135–145)
TCO2: 30 mmol/L (ref 0–100)

## 2016-02-20 MED ORDER — ACETAMINOPHEN 500 MG PO TABS
1000.0000 mg | ORAL_TABLET | Freq: Once | ORAL | Status: AC
  Administered 2016-02-20: 1000 mg via ORAL
  Filled 2016-02-20: qty 2

## 2016-02-20 NOTE — ED Provider Notes (Signed)
[facility] DEPT Provider Note   CSN: [id number] Arrival date & time: 02/20/16  1735     History   Chief Complaint Chief Complaint  Patient presents with  . Back Pain  . Knee Pain  . Extremity Pain  . Vaginal Bleeding    HPI Valerie Kerr is a 55 y.o. female.  The history is provided by the patient.  Back Pain   This is a chronic (h/o sciatica) problem. Episode onset: >2 yrs. The problem occurs constantly. The problem has been gradually worsening. The pain is associated with no known injury. The pain is present in the sacro-iliac joint. The pain radiates to the right thigh. The pain is moderate. Pertinent negatives include no chest pain, no numbness, no headaches, no abdominal pain, no pelvic pain and no tingling.  Knee Pain   This is a chronic problem. Episode onset: >1 yr, but worse in the last 3 weeks. The problem occurs constantly. The problem has been gradually worsening. The pain is present in the left knee. The quality of the pain is described as aching. The pain is moderate. Pertinent negatives include no numbness, full range of motion, no stiffness, no tingling and no itching.  Extremity Pain  This is a new problem. Episode onset: >1 month. The problem occurs constantly (intermittent). The problem has been gradually worsening. Pertinent negatives include no chest pain, no abdominal pain, no headaches and no shortness of breath. Exacerbated by: certain position. Nothing relieves the symptoms. Treatments tried: nsaids. The treatment provided mild relief.  Vaginal Bleeding  Primary symptoms include vaginal bleeding.  Primary symptoms include no discharge, no pelvic pain, no dyspareunia, no genital lesions, no genital pain, no genital rash. There has been no fever. This is a chronic problem. Episode frequency: irregular. The problem has not changed since onset.She is not pregnant. Pertinent negatives include no abdominal pain.    Past Medical History:  Diagnosis Date  .  Acid reflux   . Hypertension     There are no active problems to display for this patient.   Past Surgical History:  Procedure Laterality Date  . CARPAL TUNNEL RELEASE Bilateral   . CERVICAL DISC SURGERY      OB History    No data available       Home Medications    Prior to Admission medications   Medication Sig Start Date End Date Taking? Authorizing Provider  omeprazole (PRILOSEC) 20 MG capsule Take 1 capsule (20 mg total) by mouth daily. Patient taking differently: Take 20 mg by mouth daily as needed (acid reflux).  07/08/15  Yes Jeffrey Hedges, PA-C  amoxicillin (AMOXIL) 500 MG capsule Take 1 capsule (500 mg total) by mouth 3 (three) times daily. Patient not taking: Reported on 11/28/2014 11/04/14   Ashley Murrain, NP  doxycycline (VIBRAMYCIN) 100 MG capsule Take 1 capsule (100 mg total) by mouth 2 (two) times daily. 11/28/14   Peggy Constant, MD  doxycycline (VIBRAMYCIN) 100 MG capsule Take 1 capsule (100 mg total) by mouth 2 (two) times daily. One po bid x 7 days 08/12/15   Mercedes Camprubi-Soms, PA-C  HYDROcodone-acetaminophen (NORCO) 5-325 MG per tablet Take 1 tablet by mouth every 6 (six) hours as needed. Patient not taking: Reported on 11/28/2014 11/04/14   Ashley Murrain, NP  ibuprofen (ADVIL,MOTRIN) 800 MG tablet Take 1 tablet (800 mg total) by mouth 3 (three) times daily. 02/03/15   Charlesetta Shanks, MD  lisinopril-hydrochlorothiazide (PRINZIDE) 10-12.5 MG per tablet Take 1 tablet by mouth daily. 07/03/14  Nicole Pisciotta, PA-C  methocarbamol (ROBAXIN) 500 MG tablet Take 1 tablet (500 mg total) by mouth 2 (two) times daily. Patient not taking: Reported on 11/28/2014 08/29/14   Ottie Glazier, PA-C  naproxen (NAPROSYN) 500 MG tablet Take 1 tablet (500 mg total) by mouth 2 (two) times daily as needed for mild pain, moderate pain or headache (TAKE WITH MEALS.). 12/24/15   Mercedes Camprubi-Soms, PA-C  nystatin-triamcinolone (MYCOLOG II) cream Apply to affected area daily Patient not  taking: Reported on 11/28/2014 11/04/14   Ashley Murrain, NP  oxyCODONE-acetaminophen (PERCOCET/ROXICET) 5-325 MG per tablet Take 2 tablets by mouth every 4 (four) hours as needed for severe pain. Patient not taking: Reported on 11/28/2014 08/29/14   Ottie Glazier, PA-C  ranitidine (ZANTAC) 150 MG tablet Take 1 tablet (150 mg total) by mouth 2 (two) times daily. 12/24/15   Mercedes Camprubi-Soms, PA-C    Family History History reviewed. No pertinent family history.  Social History Social History  Substance Use Topics  . Smoking status: Current Every Day Smoker    Types: Cigarettes  . Smokeless tobacco: Never Used  . Alcohol use No     Allergies   Fentanyl; Ibuprofen; Tramadol; and Tylenol [acetaminophen]   Review of Systems Review of Systems  Respiratory: Negative for shortness of breath.   Cardiovascular: Negative for chest pain.  Gastrointestinal: Negative for abdominal pain.  Genitourinary: Positive for vaginal bleeding. Negative for dyspareunia and pelvic pain.  Musculoskeletal: Positive for back pain. Negative for stiffness.  Skin: Negative for itching.  Neurological: Negative for tingling, numbness and headaches.     Physical Exam Updated Vital Signs BP 126/88 (BP Location: Right Arm)   Pulse 81   Resp 16   SpO2 97%   Physical Exam  Constitutional: She is oriented to person, place, and time. She appears well-developed and well-nourished. No distress.  HENT:  Head: Normocephalic and atraumatic.  Nose: Nose normal.  Eyes: Conjunctivae and EOM are normal. Pupils are equal, round, and reactive to light. Right eye exhibits no discharge. Left eye exhibits no discharge. No scleral icterus.  Neck: Normal range of motion. Neck supple.  Cardiovascular: Normal rate and regular rhythm.  Exam reveals no gallop and no friction rub.   No murmur heard. Pulmonary/Chest: Effort normal and breath sounds normal. No stridor. No respiratory distress. She has no rales.  Abdominal: Soft.  She exhibits no distension. There is no tenderness.  Genitourinary: Pelvic exam was performed with patient supine.  Genitourinary Comments: Large irregular cervical mass.  Musculoskeletal: She exhibits no edema.       Left knee: She exhibits effusion. She exhibits no deformity and no erythema. Tenderness found.       Lumbar back: She exhibits tenderness. She exhibits no bony tenderness.       Back:       Arms: Neurological: She is alert and oriented to person, place, and time.  Spine Exam: Strength: 5/5 throughout LE bilaterally (hip flexion/extension, adduction/abduction; knee flexion/extension; foot dorsiflexion/plantarflexion, inversion/eversion; great toe inversion) Sensation: Intact to light touch in proximal and distal LE bilaterally Reflexes: 2+ quadriceps and achilles reflexes    Skin: Skin is warm and dry. No rash noted. She is not diaphoretic. No erythema.  Psychiatric: She has a normal mood and affect.  Vitals reviewed.    ED Treatments / Results  Labs (all labs ordered are listed, but only abnormal results are displayed) Labs Reviewed  I-STAT CHEM 8, ED - Abnormal; Notable for the following:  Result Value   Chloride 99 (*)    All other components within normal limits    EKG  EKG Interpretation None       Radiology No results found.  Procedures Procedures (including critical care time)  Medications Ordered in ED Medications  acetaminophen (TYLENOL) tablet 1,000 mg (1,000 mg Oral Given 02/20/16 2059)     Initial Impression / Assessment and Plan / ED Course  I have reviewed the triage vital signs and the nursing notes.  Pertinent labs & imaging results that were available during my care of the patient were reviewed by me and considered in my medical decision making (see chart for details).  Clinical Course     55 y.o. female presents with chronic right sciatica, chronic left knee pain, and new intermittent right shoulder pain. No acute  traumatic onset. No red flag symptoms of fever, weight loss, saddle anesthesia, weakness, fecal/urinary incontinence or urinary retention. Suspect MSK etiology. No indication for imaging emergently. Patient was recommended to take short course of scheduled NSAIDs and engage in early mobility as definitive treatment. Return precautions discussed for worsening or new concerning symptoms.   Vaginal bleed etiology concerning for cervical cancer given mass on GU exam. Spoke with Ob/Gyn who will assist in getting pt into the clinic for close follow up.  The patient is safe for discharge with strict return precautions.   Final Clinical Impressions(s) / ED Diagnoses   Final diagnoses:  Cervical mass  Chronic pain of left knee  Chronic bilateral low back pain with right-sided sciatica  Acute pain of right shoulder   Disposition: Discharge  Condition: Good  I have discussed the results, Dx and Tx plan with the patient who expressed understanding and agree(s) with the plan. Discharge instructions discussed at great length. The patient was given strict return precautions who verbalized understanding of the instructions. No further questions at time of discharge.    Discharge Medication List as of 02/20/2016 11:33 PM      Follow Up: [facility] [place] [facility] [phone] Call  For help establishing care with a Ob/Gyn doctor for assessment of cervical mass  [facility] [phone] Call  For help establishing care with a care provider      Fatima Blank, MD 02/21/16 [phone]

## 2016-02-20 NOTE — Progress Notes (Signed)
Orthopedic Tech Progress Note Patient Details:  Valerie Kerr 05/26/1960 [id number]  Ortho Devices Type of Ortho Device: Knee Sleeve Ortho Device/Splint Location: LLE Ortho Device/Splint Interventions: Ordered, Application   Braulio Bosch 02/20/2016, 8:56 PM

## 2016-02-20 NOTE — ED Triage Notes (Addendum)
Lower back pain for a week or so. Right arm burning sensation for over a month. Left knee pain and swelling off and on for over a month. No access to a PCP, has not had any of these evaluated. States she has had vaginal bleeding for a week, but thought she had stopped menstruating since age 55.

## 2016-02-25 ENCOUNTER — Telehealth: Payer: Self-pay | Admitting: Obstetrics and Gynecology

## 2016-02-29 NOTE — Telephone Encounter (Signed)
actigall tab ordered

## 2016-03-01 ENCOUNTER — Other Ambulatory Visit (HOSPITAL_COMMUNITY)
Admission: RE | Admit: 2016-03-01 | Discharge: 2016-03-01 | Disposition: A | Discharge status: Home / Self Care | Payer: Self-pay | Source: Ambulatory Visit | Attending: Obstetrics and Gynecology | Admitting: Obstetrics and Gynecology

## 2016-03-01 ENCOUNTER — Encounter: Payer: Self-pay | Admitting: Obstetrics and Gynecology

## 2016-03-01 ENCOUNTER — Ambulatory Visit (INDEPENDENT_AMBULATORY_CARE_PROVIDER_SITE_OTHER): Payer: Self-pay | Admitting: Obstetrics and Gynecology

## 2016-03-01 VITALS — BP 140/54 | HR 80 | Wt 160.0 lb

## 2016-03-01 DIAGNOSIS — C539 Malignant neoplasm of cervix uteri, unspecified: Secondary | ICD-10-CM | POA: Insufficient documentation

## 2016-03-01 DIAGNOSIS — N950 Postmenopausal bleeding: Secondary | ICD-10-CM

## 2016-03-01 MED ORDER — IBUPROFEN 800 MG PO TABS: 800.0000 mg | ORAL_TABLET | Freq: Three times a day (TID) | ORAL | 0 refills | Status: DC

## 2016-03-01 NOTE — Progress Notes (Signed)
   Subjective:    Patient ID: Valerie Kerr, female    DOB: 1961-01-07, 55 y.o.   MRN: [id number]  Patient is a 55 year old G5 P5 who presents today with postmenopausal bleeding. She was seen in the emergency department and they believed they sell cervical mass and recommended follow-up with oncology. Patient states she entered menopause at age 33 and had no bleeding until last year. Over the last year she's had intermittent bleeding which is becoming progressively worse. She denies night sweats fevers chills nausea or vomiting. She denies abdominal pain. She reports she's not had any imaging. She does report she had a normal Pap in [place] 2 years ago.      Review of Systems  Constitutional: Negative for activity change and appetite change.  Cardiovascular: Negative for chest pain and palpitations.  Gastrointestinal: Negative for abdominal distention, abdominal pain, constipation, diarrhea, nausea and vomiting.  Endocrine: Negative for cold intolerance and heat intolerance.  Genitourinary: Negative for difficulty urinating and frequency.       Objective:   Physical Exam  Constitutional: She is oriented to person, place, and time. She appears well-developed and well-nourished.  Cardiovascular: Normal rate and intact distal pulses.   Pulmonary/Chest: Effort normal and breath sounds normal. No respiratory distress. She has no wheezes.  Abdominal: Soft. Bowel sounds are normal. She exhibits no distension. There is no tenderness. There is no rebound and no guarding.  Genitourinary:  Genitourinary Comments: Large easily friable mass covering the cervix os not able to be identified, vaginal wall appears normal dried blood in the vaginal vault  Musculoskeletal: Normal range of motion. She exhibits no edema.  Neurological: She is alert and oriented to person, place, and time.  Skin: Skin is warm and dry.    Procedure note: Procedure performed by Dr. [name]  Ring forceps were used to  remove several pieces of cervical mass months cells was applied to stop bleeding. Pressure was applied with multiple Fox Swabs Hemostasis was seen. Patient was given a dose of ibuprofen here in the office.      Assessment & Plan:  #1: Postmenopausal vaginal bleeding. Biopsies sent. Await results. Abdominal ultrasound ordered, avoid transvaginal scan given easily friable mass. Patient will follow up with myself or a surgeon.

## 2016-03-01 NOTE — Patient Instructions (Signed)
Postmenopausal Bleeding Postmenopausal bleeding is any bleeding after menopause. Menopause is when a woman's period stops. Any type of bleeding after menopause is concerning. It should be checked by your doctor. Any treatment will depend on the cause. Follow these instructions at home: Watch your condition for any changes.  Avoid the use of tampons and douches as told by your doctor.  Change your pads often.  Get regular pelvic exams and Pap tests.  Keep all appointments for tests as told by your doctor.  Contact a doctor if:  Your bleeding lasts for more than 1 week.  You have belly (abdominal) pain.  You have bleeding after sex (intercourse). Get help right away if:  You have a fever, chills, a headache, dizziness, muscle aches, and bleeding.  You have strong pain with bleeding.  You have clumps of blood (blood clots) coming from your vagina.  You have bleeding and need more than 1 pad an hour.  You feel like you are going to pass out (faint). This information is not intended to replace advice given to you by your health care provider. Make sure you discuss any questions you have with your health care provider. Document Released: 12/22/2007 Document Revised: 08/20/2015 Document Reviewed: 10/11/2012 Elsevier Interactive Patient Education  2017 [employer]vier Inc.  

## 2016-03-01 NOTE — Addendum Note (Signed)
Addended by: Phillip Heal, Davidson Palmieri A on: 03/01/2016 04:57 PM   Modules accepted: Orders

## 2016-03-03 ENCOUNTER — Ambulatory Visit (HOSPITAL_COMMUNITY)
Admission: RE | Admit: 2016-03-03 | Discharge: 2016-03-03 | Disposition: A | Discharge status: Home / Self Care | Payer: Self-pay | Source: Ambulatory Visit | Attending: Obstetrics and Gynecology | Admitting: Obstetrics and Gynecology

## 2016-03-03 ENCOUNTER — Telehealth: Payer: Self-pay | Admitting: Obstetrics and Gynecology

## 2016-03-03 DIAGNOSIS — C539 Malignant neoplasm of cervix uteri, unspecified: Secondary | ICD-10-CM | POA: Insufficient documentation

## 2016-03-03 DIAGNOSIS — N950 Postmenopausal bleeding: Secondary | ICD-10-CM

## 2016-03-03 NOTE — Telephone Encounter (Signed)
GYN Telephone Note 03/03/2016 1754  Call from pathology re: biopsy results and they needed to speak to the physician on call. Cx biopsy from 12/15 +[facility].   Patient called at [phone] but no answer and with generic VM. VM left for patient to call the GYN clinic ASAP  Emergency contact number of [phone] called (Daughter-Valerie Kerr in [place]) but no answer so VM left to please have Valerie Kerr call the clinic ASAP.  Task sent to pool to refer to GYN ONC ASAP.   Durene Romans MD Attending Center for [facility] [employer] [facility] manager)

## 2016-03-03 NOTE — Addendum Note (Signed)
Addended by: Aletha Halim on: 03/03/2016 06:06 PM   Modules accepted: Orders

## 2016-03-03 NOTE — Progress Notes (Signed)
Also task sent to pool to set up PET scan from skull base to mid thigh prior to gyn onc appointment.   Durene Romans MD Attending Center for [employer] (Faculty Practice) 03/03/2016

## 2016-03-04 ENCOUNTER — Telehealth: Payer: Self-pay | Admitting: Obstetrics and Gynecology

## 2016-03-04 ENCOUNTER — Telehealth: Payer: Self-pay | Admitting: General Practice

## 2016-03-04 NOTE — Telephone Encounter (Signed)
Per Dr Ilda Basset, patient needs PET scan scheduled prior to GYN ONC appt. Scheduled for 12/19 @ 130pm at [facility]. Patient needs to be NPO 8 hours prior. Called patient, no answer- left message to call us back regarding an appt. Patient still needs GYN ONC appt scheduled. Patient not aware of diagnosis yet.

## 2016-03-04 NOTE — Telephone Encounter (Signed)
GYN Telephone Note 03/04/2016 0857  Patient called at [phone] but no answer and with generic VM. VM left for patient to call the GYN clinic ASAP  Emergency contact number of [phone] called (Daughter-Aremessie Mederos in [place]) and she picked up and confirmed her mom's birthday. She states that the above number is her mom's and she got the VM yesterday but hasn't been able to call the clinic and is at work currently. I gave her the number for clinic and told her to please call the clinic as soon as she can. I also told her that the clinic is closed in the afternoon so she may not be able to get in contact with someone and if that's case to call 8-5 on Monday.   I'll follow up with the patient on Monday   Durene Romans MD Attending Center for [facility] [employer] [facility] (Faculty Practice) 03/04/2016

## 2016-03-07 ENCOUNTER — Telehealth: Payer: Self-pay | Admitting: General Practice

## 2016-03-07 NOTE — Telephone Encounter (Signed)
Made appt with GYN ONC 12/27 @ 1015, patient should arrive 15-30 minutes prior and can use valet parking at the cancer center. Called patient regarding PET scan appt and npo status 8 hours prior to PET scan and appt in our office the following day. Patient verbalized understanding to all and is aware of PET scan appt as well as our office appt. Will inform patient of GYN ONC appt when she comes into the office 12/20

## 2016-03-15 ENCOUNTER — Encounter (HOSPITAL_COMMUNITY): Payer: Self-pay

## 2016-03-15 ENCOUNTER — Ambulatory Visit (HOSPITAL_COMMUNITY)
Admission: RE | Admit: 2016-03-15 | Discharge: 2016-03-15 | Disposition: A | Discharge status: Home / Self Care | Payer: Self-pay | Source: Ambulatory Visit | Attending: Obstetrics and Gynecology | Admitting: Obstetrics and Gynecology

## 2016-03-15 DIAGNOSIS — C539 Malignant neoplasm of cervix uteri, unspecified: Secondary | ICD-10-CM

## 2016-03-16 ENCOUNTER — Telehealth: Payer: Self-pay | Admitting: *Deleted

## 2016-03-16 ENCOUNTER — Ambulatory Visit (INDEPENDENT_AMBULATORY_CARE_PROVIDER_SITE_OTHER): Payer: Self-pay | Admitting: Obstetrics and Gynecology

## 2016-03-16 ENCOUNTER — Encounter: Payer: Self-pay | Admitting: Obstetrics and Gynecology

## 2016-03-16 VITALS — BP 160/72 | HR 99 | Wt 158.4 lb

## 2016-03-16 DIAGNOSIS — Z712 Person consulting for explanation of examination or test findings: Secondary | ICD-10-CM

## 2016-03-16 DIAGNOSIS — Z7189 Other specified counseling: Secondary | ICD-10-CM

## 2016-03-16 MED ORDER — LISINOPRIL-HYDROCHLOROTHIAZIDE 10-12.5 MG PO TABS: 1.0000 | ORAL_TABLET | Freq: Every day | ORAL | 0 refills | Status: DC

## 2016-03-16 NOTE — Telephone Encounter (Signed)
Patient left message, was scheduled for a CT scan today, but did not realize it was a whole body scan, was not able to go through with test d/y claustrophobia. Requesting a reschedule with sedation ordered.

## 2016-03-16 NOTE — Addendum Note (Signed)
Addended by: Michel Harrow on: 03/16/2016 05:25 PM   Modules accepted: Orders

## 2016-03-16 NOTE — Progress Notes (Signed)
55 yo presenting today to discuss results of cervical biopsy. Patient is accompanied by her daughter and grandson. Results of the biopsy were reviewed with the patient and her daughter which demonstrated cervical cancer.   Blood pressure (!) 160/72, pulse 99, weight 158 lb 6.4 oz (71.8 kg). GENERAL: Well-developed, well-nourished female in no acute distress.  NEURO: alert and oriented x3   Cervix, biopsy, mass INFILTRATIVE SQUAMOUS CELL CARCINOMA 2. 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

## 2016-03-22 NOTE — Progress Notes (Signed)
Consult Note: Gyn-Onc  Consult was requested by Dr. [name] for the evaluation of Valerie Kerr 55 y.o. female  CC:  Chief Complaint  Patient presents with  . Cervical Cancer    Assessment/Plan:  Valerie Kerr  is a 55 y.o.  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  I discussed with the patient that we first require imagine with PET scan to evaluate for distant metastases. This had been previously canceled by the patient due to her concern regarding the results and her "claustriphobia". I have prescribed her ativan to take on the morning of the scan.  I discussed that her tumor is not contained by the cervix and therefore she is not eligible for surgical resection. Instead she requires primary chemoradiation.  We will have her seen by radiation oncology and medical oncology to discuss this treatment (weekly cddp 40mg /m2 and external beam with intracavitary brachytherapy). If distant metastases are seen on imaging, I feel that she would still benefit from pelvic RT as she has bleeding symptoms, however, it would be palliative intent and we would change chemotherapy prescription.   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  The patient reports last having a pap smear approximately 4 years ago in [place] which, was followed by a biopsy which, per patient, was "normal" and she was not informed that she would require special followup. She began experiencing postmenopausal bleeding at the beginning of 2016 and continued to bleed intermittently for 2 years.   She presented to the ER in [place] in November (25th), 2017 and a cervical mass was identified on pelvic exam.  She was seen in the office by Dr Baron Sane 03/01/16 who performed cervical biopsies of a friable mass which revealed poorly differentiated squamous  cell carcinoma. Attempts were made to notify her of this result on 03/03/16, however a phone call was left to call back and there appears to be delay in her receiving the information about her result.  She presented to the office to be seen by Dr [name] for discussion regarding results on 03/16/16 and was informed of the results and the need to see oncology.   A PET had been ordered however, the patient cancelled this due to claustrophobia and fear of having a scan and getting bad results.  Current Meds:  Outpatient Encounter Prescriptions as of 03/23/2016  Medication Sig  . lisinopril-hydrochlorothiazide (PRINZIDE) 10-12.5 MG tablet Take 1 tablet by mouth daily.  . methadone (DOLOPHINE) 5 MG tablet Take 55 mg by mouth every 8 (eight) hours.  . naproxen (NAPROSYN) 500 MG tablet Take 1 tablet (500 mg total) by mouth 2 (two) times daily as needed for mild pain, moderate pain or headache (TAKE WITH MEALS.).  Marland Kitchen nystatin-triamcinolone (MYCOLOG II) cream Apply to affected area daily  . omeprazole (PRILOSEC) 20 MG capsule Take 1 capsule (20 mg total) by mouth daily. (Patient taking differently: Take 20 mg by mouth daily as needed (acid reflux). )  . oxyCODONE-acetaminophen (PERCOCET/ROXICET) 5-325 MG per tablet Take 2 tablets by mouth every 4 (four) hours as needed for severe pain.  Marland Kitchen ibuprofen (ADVIL,MOTRIN) 800 MG tablet Take 1 tablet (800 mg total) by mouth 3 (three) times daily. (Patient not taking: Reported on 03/23/2016)  . LORazepam (ATIVAN) 1 MG tablet Take 1 tablet (1 mg total) by mouth once. Take on morning of  PET scan  . ranitidine (ZANTAC) 150 MG tablet Take 1 tablet (150 mg total) by mouth 2 (two) times daily. (Patient not taking: Reported on 03/23/2016)  . [DISCONTINUED] amoxicillin (AMOXIL) 500 MG capsule Take 1 capsule (500 mg total) by mouth 3 (three) times daily. (Patient not taking: Reported on 03/23/2016)  . [DISCONTINUED] doxycycline (VIBRAMYCIN) 100 MG capsule Take 1 capsule (100  mg total) by mouth 2 (two) times daily. (Patient not taking: Reported on 03/23/2016)  . [DISCONTINUED] doxycycline (VIBRAMYCIN) 100 MG capsule Take 1 capsule (100 mg total) by mouth 2 (two) times daily. One po bid x 7 days (Patient not taking: Reported on 03/23/2016)  . [DISCONTINUED] HYDROcodone-acetaminophen (NORCO) 5-325 MG per tablet Take 1 tablet by mouth every 6 (six) hours as needed. (Patient not taking: Reported on 03/23/2016)  . [DISCONTINUED] methocarbamol (ROBAXIN) 500 MG tablet Take 1 tablet (500 mg total) by mouth 2 (two) times daily. (Patient not taking: Reported on 03/23/2016)   No facility-administered encounter medications on file as of 03/23/2016.     Allergy:  Allergies  Allergen Reactions  . Fentanyl   . Ibuprofen   . Tramadol   . Tylenol [Acetaminophen]     Social Hx:   Social History   Social History  . Marital status: Divorced    Spouse name: N/A  . Number of children: N/A  . Years of education: N/A   Occupational History  . Not on file.   Social History Main Topics  . Smoking status: Current Every Day Smoker    Packs/day: 0.50    Years: 45.00    Types: Cigarettes  . Smokeless tobacco: Never Used  . Alcohol use No  . Drug use: No  . Sexual activity: Not on file   Other Topics Concern  . Not on file   Social History Narrative  . No narrative on file    Past Surgical Hx:  Past Surgical History:  Procedure Laterality Date  . CARPAL TUNNEL RELEASE Bilateral   . CERVICAL DISC SURGERY      Past Medical Hx:  Past Medical History:  Diagnosis Date  . Acid reflux   . Hypertension     Past Gynecological History:  Denies history of abnormal paps. Last pap was 4 years ago in [place] per patient. SVD x 5 No LMP recorded. Patient is not currently having periods (Reason: Perimenopausal).  Family Hx: History reviewed. No pertinent family history.  Review of Systems:  Constitutional  Feels well,    ENT Normal appearing ears and nares  bilaterally Skin/Breast  No rash, sores, jaundice, itching, dryness Cardiovascular  No chest pain, shortness of breath, or edema  Pulmonary  No cough or wheeze.  Gastro Intestinal  No nausea, vomitting, or diarrhoea. No bright red blood per rectum, no abdominal pain, change in bowel movement, or constipation.  Genito Urinary  No frequency, urgency, dysuria, + vaginal bleeding x 5 years. Musculo Skeletal  No myalgia, arthralgia, joint swelling or pain  Neurologic  No weakness, numbness, change in gait,  Psychology  No depression, anxiety, insomnia.   Vitals:  Blood pressure (!) 148/81, pulse (!) 107, temperature 98.5 F (36.9 C), temperature source Oral, resp. rate 18, height 5\' 1"  (1.549 m), weight 158 lb 14.4 oz (72.1 kg), SpO2 98 %.  Physical Exam: WD in NAD Neck  Supple NROM, without any enlargements.  Lymph Node Survey No cervical supraclavicular or inguinal adenopathy Cardiovascular  Pulse normal rate, regularity and rhythm. S1 and S2 normal.  Lungs  Clear  to auscultation bilateraly, without wheezes/crackles/rhonchi. Good air movement.  Skin  No rash/lesions/breakdown  Psychiatry  Alert and oriented to person, place, and time  Abdomen  Normoactive bowel sounds, abdomen soft, non-tender and nonobese without evidence of hernia.  Back No CVA tenderness Genito Urinary  Vulva/vagina: Normal external female genitalia.  No lesions. No discharge or bleeding.  Bladder/urethra:  No lesions or masses, well supported bladder  Vagina: grossly normal  Cervix: The cervix is completely replaced by a 6cm exophytic friable mass which is replacing the cervix and encroaching the upper vaginal fornices and the parametrium bilaterally.  Uterus:  Small, mobile, + right parametrial involvement.  Adnexa: no masses. Rectal  + right parametrial involvement. Difficult to appreciate left due to size of mass in vagina. Extremities  No bilateral cyanosis, clubbing or edema.   Donaciano Eva, MD  03/23/2016, 11:38 AM

## 2016-03-23 ENCOUNTER — Encounter: Payer: Self-pay | Admitting: Radiation Oncology

## 2016-03-23 ENCOUNTER — Encounter: Payer: Self-pay | Admitting: Gynecologic Oncology

## 2016-03-23 ENCOUNTER — Ambulatory Visit: Payer: Self-pay | Attending: Gynecologic Oncology | Admitting: Gynecologic Oncology

## 2016-03-23 ENCOUNTER — Telehealth: Payer: Self-pay

## 2016-03-23 VITALS — BP 148/81 | HR 107 | Temp 98.5°F | Resp 18 | Ht 61.0 in | Wt 158.9 lb

## 2016-03-23 DIAGNOSIS — F17210 Nicotine dependence, cigarettes, uncomplicated: Secondary | ICD-10-CM | POA: Insufficient documentation

## 2016-03-23 DIAGNOSIS — C539 Malignant neoplasm of cervix uteri, unspecified: Secondary | ICD-10-CM | POA: Insufficient documentation

## 2016-03-23 DIAGNOSIS — C531 Malignant neoplasm of exocervix: Secondary | ICD-10-CM

## 2016-03-23 DIAGNOSIS — I10 Essential (primary) hypertension: Secondary | ICD-10-CM | POA: Insufficient documentation

## 2016-03-23 DIAGNOSIS — K219 Gastro-esophageal reflux disease without esophagitis: Secondary | ICD-10-CM | POA: Insufficient documentation

## 2016-03-23 MED ORDER — LORAZEPAM 1 MG PO TABS: 1.0000 mg | ORAL_TABLET | Freq: Once | ORAL | 1 refills | Status: AC

## 2016-03-23 NOTE — Telephone Encounter (Signed)
RN left message for pt to call office to confirm upcoming appointment referral dates and times as discussed during visit today.

## 2016-03-23 NOTE — Patient Instructions (Addendum)
PET scan will be on Apr 04, 2016 at 8 am with arrival at 7:30am.  Nothing to eat or drink 6 hours before and take your Ativan one hour before your test and have someone drive you there.  We will also make appointments for you to meet with the Radiation Oncologist here at the [facility] on the ground floor to discuss radiation.  We will also make an appt for you to meet with the Medical Oncologist that will be giving chemotherapy to help the radiation work better (appts will be for chemo education class, labs, then to see the doc).  Please call our office for any questions or concerns.

## 2016-03-23 NOTE — Addendum Note (Signed)
Addended by: [name] D on: 03/23/2016 11:54 AM   Modules accepted: Orders

## 2016-03-24 ENCOUNTER — Telehealth: Payer: Self-pay

## 2016-03-24 NOTE — Telephone Encounter (Signed)
Notified pt of upcoming appointment dates and times. She verified understanding of information presented.

## 2016-03-29 ENCOUNTER — Encounter: Payer: Self-pay | Admitting: Radiation Oncology

## 2016-03-30 ENCOUNTER — Ambulatory Visit: Payer: Self-pay | Admitting: Internal Medicine

## 2016-03-30 ENCOUNTER — Telehealth: Payer: Self-pay

## 2016-03-30 ENCOUNTER — Other Ambulatory Visit: Payer: Self-pay | Admitting: Oncology

## 2016-03-30 DIAGNOSIS — C531 Malignant neoplasm of exocervix: Secondary | ICD-10-CM

## 2016-03-30 NOTE — Telephone Encounter (Signed)
Pt called c/o anxiety r/t current diagnosis/prognosis, Ms Walenta requested a script for anti-anxiety medication. Pt does not have a PCP at this time, she is also currently taking Methadone, states 55mg  daily, RN unable to verify dose at this time, [facility], [place], [place] was closed. Explained to Kameshia that we need to verify her Methadone dose before prescribing any medication. Pt stated she would sign record release at clinin in am, and verbalized understanding of needing to establish a PCP. RN will f/u in am with pt and [employer].

## 2016-03-31 ENCOUNTER — Encounter: Payer: Self-pay | Admitting: Oncology

## 2016-03-31 ENCOUNTER — Other Ambulatory Visit: Payer: Self-pay | Admitting: Gynecologic Oncology

## 2016-03-31 ENCOUNTER — Encounter: Payer: Self-pay | Admitting: Radiation Oncology

## 2016-03-31 ENCOUNTER — Other Ambulatory Visit: Payer: Self-pay

## 2016-03-31 ENCOUNTER — Encounter: Payer: Self-pay | Admitting: *Deleted

## 2016-03-31 DIAGNOSIS — F418 Other specified anxiety disorders: Secondary | ICD-10-CM

## 2016-03-31 MED ORDER — ALPRAZOLAM 0.25 MG PO TABS: 0.2500 mg | ORAL_TABLET | Freq: Two times a day (BID) | ORAL | 0 refills | Status: DC | PRN

## 2016-03-31 NOTE — Progress Notes (Signed)
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  Biopsies revealed:   03/01/16 Diagnosis 1. Cervix, biopsy, mass INFILTRATIVE SQUAMOUS CELL CARCINOMA 2. Cervix, biopsy INVASIVE POORLY DIFFERENTIATED SQUAMOUS CELL CARCINOMA  Past/Anticipated interventions by Gyn/Onc surgery, if any: Per Dr. [name] "her tumor is not contained by the cervix and therefore she is not eligible for surgical resection."  Past/Anticipated interventions by medical oncology, if any: weekly cddp 40mg /m2 - will see Dr. Marko Kerr on 04/01/16  Weight changes, if any:   Bowel/Bladder complaints, if any: ,   Nausea/Vomiting, if any:   Pain issues, if any:    SAFETY ISSUES:  Prior radiation?   Pacemaker/ICD?   Possible current pregnancy? no  Is the patient on methotrexate?  Current Complaints / other details:  Will have PET scan on 04/04/16.  Dr. [name] is recommending weekly cddp 40mg /m2 and external beam with intracavitary brachytherapy.

## 2016-03-31 NOTE — Progress Notes (Signed)
Met with uninsured patient to discuss financial concerns. Asked patient if she has applied for Medicaid, she states not yet. Provided patient with a Medicaid application along with the supporting documents that need to be submitted with application. Also advised patient for being uninsured she automatically receives a 55% discount for services in [facility] and she may apply for an additional discount by completing the [place], provided one to patient as well as documents that need to be submitted with application. Advised patient she may bring all of this information to me tomorrow. Daughter verbalized understanding. Patient was sleepy and anxious. Advised them once the treatment plan has been established, I would be able to research other available assistance and needs. Provided them with my card for any additional financial questions or concerns. Patient states she spoke with Cecille Rubin about medication for anxiety. I called Cecille Rubin who was going to check with NP and advise patient. Escorted patient to waiting room per Cecille Rubin.

## 2016-04-01 ENCOUNTER — Ambulatory Visit (HOSPITAL_BASED_OUTPATIENT_CLINIC_OR_DEPARTMENT_OTHER): Payer: Self-pay

## 2016-04-01 ENCOUNTER — Ambulatory Visit (HOSPITAL_BASED_OUTPATIENT_CLINIC_OR_DEPARTMENT_OTHER): Payer: Self-pay | Admitting: Oncology

## 2016-04-01 ENCOUNTER — Encounter: Payer: Self-pay | Admitting: Oncology

## 2016-04-01 ENCOUNTER — Other Ambulatory Visit: Payer: Self-pay

## 2016-04-01 VITALS — BP 152/82 | HR 95 | Temp 98.6°F | Resp 17 | Ht 61.0 in | Wt 157.7 lb

## 2016-04-01 DIAGNOSIS — C539 Malignant neoplasm of cervix uteri, unspecified: Secondary | ICD-10-CM

## 2016-04-01 DIAGNOSIS — Z720 Tobacco use: Secondary | ICD-10-CM

## 2016-04-01 DIAGNOSIS — F1190 Opioid use, unspecified, uncomplicated: Secondary | ICD-10-CM

## 2016-04-01 DIAGNOSIS — J309 Allergic rhinitis, unspecified: Secondary | ICD-10-CM | POA: Insufficient documentation

## 2016-04-01 DIAGNOSIS — I10 Essential (primary) hypertension: Secondary | ICD-10-CM

## 2016-04-01 DIAGNOSIS — Z5111 Encounter for antineoplastic chemotherapy: Secondary | ICD-10-CM

## 2016-04-01 DIAGNOSIS — K089 Disorder of teeth and supporting structures, unspecified: Secondary | ICD-10-CM

## 2016-04-01 DIAGNOSIS — C531 Malignant neoplasm of exocervix: Secondary | ICD-10-CM

## 2016-04-01 DIAGNOSIS — K219 Gastro-esophageal reflux disease without esophagitis: Secondary | ICD-10-CM

## 2016-04-01 DIAGNOSIS — F1120 Opioid dependence, uncomplicated: Secondary | ICD-10-CM | POA: Insufficient documentation

## 2016-04-01 DIAGNOSIS — Z23 Encounter for immunization: Secondary | ICD-10-CM

## 2016-04-01 LAB — CBC WITH DIFFERENTIAL/PLATELET
BASO%: 0.5 % (ref 0.0–2.0)
Basophils Absolute: 0 10*3/uL (ref 0.0–0.1)
EOS%: 1 % (ref 0.0–7.0)
Eosinophils Absolute: 0.1 10*3/uL (ref 0.0–0.5)
HEMATOCRIT: 42.5 % (ref 34.8–46.6)
HEMOGLOBIN: 13.6 g/dL (ref 11.6–15.9)
LYMPH#: 3.1 10*3/uL (ref 0.9–3.3)
LYMPH%: 36.8 % (ref 14.0–49.7)
MCH: 27.6 pg (ref 25.1–34.0)
MCHC: 32.1 g/dL (ref 31.5–36.0)
MCV: 86.2 fL (ref 79.5–101.0)
MONO#: 0.4 10*3/uL (ref 0.1–0.9)
MONO%: 4.9 % (ref 0.0–14.0)
NEUT%: 56.8 % (ref 38.4–76.8)
NEUTROS ABS: 4.7 10*3/uL (ref 1.5–6.5)
PLATELETS: 262 10*3/uL (ref 145–400)
RBC: 4.93 10*6/uL (ref 3.70–5.45)
RDW: 13.8 % (ref 11.2–14.5)
WBC: 8.3 10*3/uL (ref 3.9–10.3)

## 2016-04-01 LAB — COMPREHENSIVE METABOLIC PANEL
ALBUMIN: 4 g/dL (ref 3.5–5.0)
ALT: 25 U/L (ref 0–55)
ANION GAP: 9 meq/L (ref 3–11)
AST: 23 U/L (ref 5–34)
Alkaline Phosphatase: 94 U/L (ref 40–150)
BILIRUBIN TOTAL: 0.56 mg/dL (ref 0.20–1.20)
BUN: 13.2 mg/dL (ref 7.0–26.0)
CALCIUM: 9.6 mg/dL (ref 8.4–10.4)
CO2: 31 mEq/L — ABNORMAL HIGH (ref 22–29)
CREATININE: 0.8 mg/dL (ref 0.6–1.1)
Chloride: 100 mEq/L (ref 98–109)
EGFR: >90 mL/min/{1.73_m2} (ref >90)
Glucose: 191 mg/dl — ABNORMAL HIGH (ref 70–140)
Potassium: 3.6 mEq/L (ref 3.5–5.1)
Sodium: 141 mEq/L (ref 136–145)
TOTAL PROTEIN: 7.8 g/dL (ref 6.4–8.3)

## 2016-04-01 LAB — IRON AND TIBC
%SAT: 45 % (ref 21–57)
IRON: 137 ug/dL (ref 41–142)
TIBC: 305 ug/dL (ref 236–444)
UIBC: 168 ug/dL (ref 120–384)

## 2016-04-01 LAB — FERRITIN: Ferritin: 79 ng/ml (ref 9–269)

## 2016-04-01 LAB — MAGNESIUM: MAGNESIUM: 1.9 mg/dL (ref 1.5–2.5)

## 2016-04-01 MED ORDER — INFLUENZA VAC SPLIT QUAD 0.5 ML IM SUSY
0.5000 mL | PREFILLED_SYRINGE | Freq: Once | INTRAMUSCULAR | Status: AC
  Administered 2016-04-01: 0.5 mL via INTRAMUSCULAR
  Filled 2016-04-01: qty 0.5

## 2016-04-01 NOTE — Progress Notes (Signed)
[place] NEW PATIENT EVALUATION   Name: Valerie Kerr Date: April 01, 2016  MRN: [id number] DOB: 1960/07/25  REFERRING PHYSICIAN: [name]  CC: [name], [name]   REASON FOR REFERRAL: IIB poorly differentiated squamous cell carcinoma of cervix   HISTORY OF PRESENT ILLNESS:Valerie Kerr is a 57 y.o. female who is seen in consultation, together with daughter Kimbely Whiteaker , at the request of Dr [name], with recently diagnosed squamous cell carcinoma of cervix. Disease is IIB by Dr Serita Grit exam, with PET pending 04-04-16.  She will see Dr Sondra Come in consultation also on 04-04-16.  Patient and daughter attended chemotherapy education class 03-31-16  Patient presented to ED 02-20-16 with postmenopausal bleeding which had been intermittent x 2 years. She was seen by gyn with cervical biopsy 03-01-16 showing poorly differentiated squamous cell carcinoma. There was difficulty reaching patient by phone with those results; she did have follow up with  Dr [name] on 03-16-16, but cancelled planned PET due to anxiety.  She saw Dr [name] on 03-23-16, with cervix replaced by 6 cm friable mass encroaching on upper vaginal fornices and right parametrial involvement. Staging PET has been rescheduled to 04-04-16.   REVIEW OF SYSTEMS: Patient denies significant vaginal bleeding since initial biopsy, does report vaginal discharge. No pelvic pain, no LE swelling, some constipation which is not new, no bladder symptoms, no abdominal pain. She is at usual weight, appetite not good due to anxiety about this illness, no nausea or vomiting, some GERD for which she uses prn prilosec. Nasal congestion with chronic allergic type sinusitis, no ear pain, no sore throat. Denies SOB, cough, sputum, chest pain. No difficulty with hearing. Some HA with sinus congestion. Vision ok with glasses. Some broken teeth, with dental "abscess" treated with antibiotics from ED, timing of that not clear.  No known  thyroid disease. Reports chronic neck and back pain from disc disease. Some arthritis hands and left knee. Sleeping poorly due to anxiety about this illness. No recent infectious illness Remainder of full 10 point review of systems negative.   ALLERGIES: Codeine; Fentanyl; Hydrocodone-acetaminophen; and Tramadol  PAST MEDICAL/ SURGICAL HISTORY:    Cervical disc surgery done in [place] tunnel surgery bilaterally HTN, not compliant with meds but still using prescriptions from MD in [place] GERD Followed at [facility] x >1 year on methadone "for back pain", presently allowed up to 5 days at a time (initially dispenses daily)  No PCP. Uses ED for dental and sinus problems.   CURRENT MEDICATIONS: reviewed as listed now in EMR Has used flonase and claritin equivalents for sinus congestion previously, helpful if she uses daily. Flu vaccine given today (04-01-16) Will use compazine for chemo nausea   SOCIAL HISTORY:  Originally from [place], has been in [place] x 1.5 years, lives with daughter Whitleigh Garramone and her children, including 2 yo [name]. Ongoing smoker x 45 years, presently <=1/2 ppd, interested in stopping. Denies ETOH, denies drugs. 1 son also in [place], 2 daughters in [place] and 1 son in [place], [place]. Works in Actuary at [employer].  No insurance, has met with [facility] financial counselors today.   FAMILY HISTORY:  No known cancer    PHYSICAL EXAM:  height is 5' 1"  (1.549 m) and weight is 157 lb 11.2 oz (71.5 kg). Her oral temperature is 98.6 F (37 C). Her blood pressure is 152/82 (abnormal) and her pulse is 95. Her respiration is 17 and oxygen saturation is 97%.  Alert, anxious, cooperative lady,  looks stated age. Nasally congested. Daughter supportive, on phone with other daughters during visit.   HEENT:normal hair pattern. PERRL, not icteric. TMs obscured with cerumen bilaterally. Nasal turbinates some erythema, no purulent drainage. Posterior  pharynx dull erythema without exudate. Mucous membranes moist. Broken teeth right lower and upper, poor dentition. Neck supple without JVD or thyroid mass  RESPIRATORY: diminished BS throughout without wheezes or rales. No dullness to percussion. Respirations not labored walking  CARDIAC/ VASCULAR: Heart RRR no gallop. Peripheral pulses symmetrical. UE veins appear adequate to begin chemo.  ABDOMEN: full, few BS, soft, not tender. No appreciable HSM or mass  LYMPH NODES: no cervical, supraclavicular, axillary or inguinal adenopathy  BREASTS: bilaterally without dominant mass or nipple/ skin findings of concern.  NEUROLOGIC: Speech fluent and appropriate. CN, motor, sensory, cerebellar nonfocal.   PSYCH expresses anxiety, a little tearful at times  SKIN: somewhat dry, no rash, ecchymoses, petechiae  MUSCULOSKELETAL: back not tender. No LE swelling. Symmetrical muscle mass    LABORATORY DATA:  Results for orders placed or performed in visit on 04/01/16 (from the past 48 hour(s))  CBC with Differential     Status: None   Collection Time: 04/01/16 12:03 PM  Result Value Ref Range   WBC 8.3 3.9 - 10.3 10e3/uL   NEUT# 4.7 1.5 - 6.5 10e3/uL   HGB 13.6 11.6 - 15.9 g/dL   HCT 42.5 34.8 - 46.6 %   Platelets 262 145 - 400 10e3/uL   MCV 86.2 79.5 - 101.0 fL   MCH 27.6 25.1 - 34.0 pg   MCHC 32.1 31.5 - 36.0 g/dL   RBC 4.93 3.70 - 5.45 10e6/uL   RDW 13.8 11.2 - 14.5 %   lymph# 3.1 0.9 - 3.3 10e3/uL   MONO# 0.4 0.1 - 0.9 10e3/uL   Eosinophils Absolute 0.1 0.0 - 0.5 10e3/uL   Basophils Absolute 0.0 0.0 - 0.1 10e3/uL   NEUT% 56.8 38.4 - 76.8 %   LYMPH% 36.8 14.0 - 49.7 %   MONO% 4.9 0.0 - 14.0 %   EOS% 1.0 0.0 - 7.0 %   BASO% 0.5 0.0 - 2.0 %  Iron and TIBC     Status: None   Collection Time: 04/01/16 12:03 PM  Result Value Ref Range   Iron 137 41 - 142 ug/dL   TIBC 305 236 - 444 ug/dL   UIBC 168 120 - 384 ug/dL   %SAT 45 21 - 57 %  Ferritin     Status: None   Collection Time:  04/01/16 12:03 PM  Result Value Ref Range   Ferritin 79 9 - 269 ng/ml  Comprehensive metabolic panel     Status: Abnormal   Collection Time: 04/01/16 12:03 PM  Result Value Ref Range   Sodium 141 136 - 145 mEq/L   Potassium 3.6 3.5 - 5.1 mEq/L   Chloride 100 98 - 109 mEq/L   CO2 31 (H) 22 - 29 mEq/L   Glucose 191 (H) 70 - 140 mg/dl    Comment: Glucose reference range is for nonfasting patients. Fasting glucose reference range is 70- 100.   BUN 13.2 7.0 - 26.0 mg/dL   Creatinine 0.8 0.6 - 1.1 mg/dL   Total Bilirubin 0.56 0.20 - 1.20 mg/dL   Alkaline Phosphatase 94 40 - 150 U/L   AST 23 5 - 34 U/L   ALT 25 0 - 55 U/L   Total Protein 7.8 6.4 - 8.3 g/dL   Albumin 4.0 3.5 - 5.0 g/dL   Calcium  9.6 8.4 - 10.4 mg/dL   Anion Gap 9 3 - 11 mEq/L   EGFR >90 >90 ml/min/1.73 m2    Comment: eGFR is calculated using the CKD-EPI Creatinine Equation (2009)  Magnesium     Status: None   Collection Time: 04/01/16 12:03 PM  Result Value Ref Range   Magnesium 1.9 1.5 - 2.5 mg/dl     CBC and CMET resulted at time of visit and discussed.  PATHOLOGY: CHERYLE, DARK Collected: 03/01/2016 Client: [facility] Accession: [id number] Received: 03/02/2016 Jacquiline Doe, [place] OF SURGICAL PATHOLOGY FINAL DIAGNOSIS Diagnosis 1. Cervix, biopsy, mass INFILTRATIVE SQUAMOUS CELL CARCINOMA 2. Cervix, biopsy INVASIVE POORLY DIFFERENTIATED SQUAMOUS CELL CARCINOMA  RADIOGRAPHY: CXR 12-24-15 showed lungs clear.   CT maxillofacial 06-2016 with bilateral dental disease  PET scheduled for 04-04-16  DISCUSSION History as above reviewed with patient and daughter, including differences in chemotherapy and radiation therapy treatments, and rationale for sensitizing chemotherapy with radiation.  She is particularly anxious about results of the upcoming PET,  has ativan from Dr [name] to use just prior to PET scan. We have discussed weekly CDDP with radiation, including need for po and IV  hydration, length of treatments and use of antiemetics. They understand that chemo will be given probably on Mondays during weeks of radiation.  Verbal consent for chemo.  She does not appear to have bacterial sinusitis. I have recommended that she resume flonase spray and lortadine daily. She is to return information to financial counselors, may be eligible for financial assistance from [employer].   She is in agreement with referral to [place]; I have spoken directly with that office now.  She would like referral to PCP, may be possible thru [facility], however I am not able to reach that office now.  Encouraged tobacco cessation, will need to follow up   IMPRESSION / PLAN:  1.Poorly differentiated squamous cell carcinoma of cervix: IIB by gyn oncology exam, PET pending 04-04-16. Plan sensitizing CDDP with radiation, tho plan may be adjusted depending on PET information. Consultation with Dr Sondra Come 04-04-16. No heavy vaginal bleeding now, and no other urgent symptoms from the cervical cancer. Chemo and other appointments with medical oncology to be set up when radiation schedule available.  2.on methadone by [facility], dispensed x 5 days at a time per patient. Per patient, this is for chronic back pain, however information from that clinic is not presently available.  3.long and ongoing tobacco x 45 years, which she is interested in stopping. Clear lungs by CXR 11-2015 4.broken and carious teeth: referral to [facility] 5.history HTN. "borderline heart size" by CXR 11-2015 Needs PCP 6.allergic sinusitis: stop smoking, try OTC meds as noted. No evidence of bacterial sinusitis at present. Needs PCP 7.post surgery for cervical disc disease.  8.flu vaccine 04-01-16 9.GERD and constipation related to pain medication. Needs PCP 10.No mammograms or colonoscopy in this EMR.   Patient and daughter have had questions answered to their  satisfaction and are in agreement with plan above. They can contact this office for questions or concerns at any time prior to next visit. Chemo orders entered, nonpathway diagnosis for [facility], managed care notified. Message to collaborative RN for antiemetic (to [facility] if assistance there) and to collaborative RN + rad onc to coordinate chemo.   Time spent 50 min , including >50% discussion and coordination of care. CC Drs [name] and Sondra Come.    Evlyn Clines, MD 04/01/2016  5:00 PM

## 2016-04-01 NOTE — Progress Notes (Signed)
Patient came in to bring Medicaid application and [employer]. Patient's employer faxed wage verification. Faxed Medicaid application to [place]. Fax received ok per confirmation sheet.  Patient missing supporting documents for the [facility] FAA. Wrote down for patient and daughter what is needed to submit application. Patient has my card for any additional financial questions or concerns.

## 2016-04-04 ENCOUNTER — Ambulatory Visit: Payer: Medicaid Other

## 2016-04-04 ENCOUNTER — Ambulatory Visit: Payer: Self-pay | Admitting: Oncology

## 2016-04-04 ENCOUNTER — Ambulatory Visit
Admission: RE | Admit: 2016-04-04 | Discharge: 2016-04-04 | Disposition: A | Discharge status: Home / Self Care | Payer: Medicaid Other | Source: Ambulatory Visit | Attending: Radiation Oncology | Admitting: Radiation Oncology

## 2016-04-04 ENCOUNTER — Encounter (HOSPITAL_COMMUNITY)
Admission: RE | Admit: 2016-04-04 | Discharge: 2016-04-04 | Disposition: A | Discharge status: Home / Self Care | Payer: Self-pay | Source: Ambulatory Visit | Attending: Obstetrics and Gynecology | Admitting: Obstetrics and Gynecology

## 2016-04-04 DIAGNOSIS — C539 Malignant neoplasm of cervix uteri, unspecified: Secondary | ICD-10-CM | POA: Insufficient documentation

## 2016-04-04 DIAGNOSIS — F418 Other specified anxiety disorders: Secondary | ICD-10-CM | POA: Insufficient documentation

## 2016-04-04 HISTORY — DX: Malignant neoplasm of cervix uteri, unspecified: C53.9

## 2016-04-04 LAB — GLUCOSE, CAPILLARY: GLUCOSE-CAPILLARY: 98 mg/dL (ref 65–99)

## 2016-04-04 MED ORDER — FLUDEOXYGLUCOSE F - 18 (FDG) INJECTION: 7.6000 | Freq: Once | INTRAVENOUS | Status: DC | PRN

## 2016-04-05 ENCOUNTER — Ambulatory Visit
Admission: RE | Admit: 2016-04-05 | Discharge: 2016-04-05 | Disposition: A | Discharge status: Home / Self Care | Payer: Medicaid Other | Source: Ambulatory Visit | Attending: Radiation Oncology | Admitting: Radiation Oncology

## 2016-04-05 ENCOUNTER — Other Ambulatory Visit: Payer: Self-pay | Admitting: Oncology

## 2016-04-05 ENCOUNTER — Encounter (HOSPITAL_COMMUNITY): Payer: Self-pay | Admitting: Dentistry

## 2016-04-05 ENCOUNTER — Ambulatory Visit (HOSPITAL_COMMUNITY): Payer: Self-pay | Admitting: Dentistry

## 2016-04-05 ENCOUNTER — Encounter: Payer: Self-pay | Admitting: Oncology

## 2016-04-05 ENCOUNTER — Ambulatory Visit: Payer: Medicaid Other

## 2016-04-05 ENCOUNTER — Encounter: Payer: Self-pay | Admitting: Radiation Oncology

## 2016-04-05 VITALS — BP 117/72 | HR 82 | Temp 98.2°F

## 2016-04-05 DIAGNOSIS — K036 Deposits [accretions] on teeth: Secondary | ICD-10-CM

## 2016-04-05 DIAGNOSIS — K06010 Localized gingival recession, unspecified: Secondary | ICD-10-CM

## 2016-04-05 DIAGNOSIS — K029 Dental caries, unspecified: Secondary | ICD-10-CM

## 2016-04-05 DIAGNOSIS — K0530 Chronic periodontitis, unspecified: Secondary | ICD-10-CM

## 2016-04-05 DIAGNOSIS — M264 Malocclusion, unspecified: Secondary | ICD-10-CM

## 2016-04-05 DIAGNOSIS — Z01818 Encounter for other preprocedural examination: Secondary | ICD-10-CM

## 2016-04-05 DIAGNOSIS — K08409 Partial loss of teeth, unspecified cause, unspecified class: Secondary | ICD-10-CM

## 2016-04-05 DIAGNOSIS — M270 Developmental disorders of jaws: Secondary | ICD-10-CM

## 2016-04-05 DIAGNOSIS — C539 Malignant neoplasm of cervix uteri, unspecified: Secondary | ICD-10-CM

## 2016-04-05 DIAGNOSIS — K083 Retained dental root: Secondary | ICD-10-CM

## 2016-04-05 DIAGNOSIS — M278 Other specified diseases of jaws: Secondary | ICD-10-CM

## 2016-04-05 DIAGNOSIS — K011 Impacted teeth: Secondary | ICD-10-CM

## 2016-04-05 DIAGNOSIS — K06020 Generalized gingival recession, unspecified: Secondary | ICD-10-CM

## 2016-04-05 DIAGNOSIS — K0401 Reversible pulpitis: Secondary | ICD-10-CM

## 2016-04-05 DIAGNOSIS — K045 Chronic apical periodontitis: Secondary | ICD-10-CM

## 2016-04-05 MED ORDER — LORAZEPAM 0.5 MG PO TABS
0.5000 mg | ORAL_TABLET | Freq: Once | ORAL | Status: AC
  Administered 2016-04-05: 0.5 mg via SUBLINGUAL
  Filled 2016-04-05: qty 1

## 2016-04-05 MED ORDER — PROCHLORPERAZINE MALEATE 10 MG PO TABS: 10.0000 mg | ORAL_TABLET | Freq: Four times a day (QID) | ORAL | 0 refills | Status: DC | PRN

## 2016-04-05 NOTE — Addendum Note (Signed)
Encounter addended by: Jacqulyn Liner, RN on: 04/05/2016 10:40 AM<BR>    Actions taken: Sign clinical note

## 2016-04-05 NOTE — Progress Notes (Signed)
See progress note under physician encounter. 

## 2016-04-05 NOTE — Progress Notes (Signed)
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 Patient moved here two years ago from [place] to be closer to her daughter. Reports she became concerned about the vaginal bleeding and presented to the ER which referred her to [facility].   Biopsies revealed:   03/01/16 Diagnosis 1. Cervix, biopsy, mass INFILTRATIVE SQUAMOUS CELL CARCINOMA 2. Cervix, biopsy INVASIVE POORLY DIFFERENTIATED SQUAMOUS CELL CARCINOMA  Past/Anticipated interventions by Gyn/Onc surgery, if any: Per Dr. [name] "her tumor is not contained by the cervix and therefore she is not eligible for surgical resection."  Past/Anticipated interventions by medical oncology, if any: weekly cddp 40mg /m2 - will see Dr. Marko Plume on 04/01/16  Weight changes, if any: denies  Bowel/Bladder complaints, if any: Reports frequent episodes of constipation related to side effects of methadone. Manages constipation with dulcolax. Denies blood in stool. Denies dysuria. Reports nocturia x 2. Reports clear vaginal discharge with occasional blood. Denies vaginal odor or itching.   Nausea/Vomiting, if any: denies  Pain issues, if any:  denies  SAFETY ISSUES:  Prior radiation? no  Pacemaker/ICD? no  Possible current pregnancy? no  Is the patient on methotrexate? no  Current Complaints / other details:  Will have PET scan on 04/04/16.  Dr. [name] is recommending weekly cddp 40mg /m2 and external beam with intracavitary brachytherapy. Reports insomnia and poor appetite related to effects of anxiety. Patient visible tearful and anxious. Patient took xanax she brought with her from home.   BP (!) 163/88 (BP Location: Left Arm, Patient Position: Sitting, Cuff Size: Normal)   Pulse (!) 101   Temp 98.2 F (36.8 C) (Oral)   Resp 18    Ht 5\' 1"  (1.549 m)   Wt 158 lb 12.8 oz (72 kg)   SpO2 100%   BMI 30.00 kg/m  Wt Readings from Last 3 Encounters:  04/05/16 158 lb 12.8 oz (72 kg)  04/01/16 157 lb 11.2 oz (71.5 kg)  03/23/16 158 lb 14.4 oz (72.1 kg)

## 2016-04-05 NOTE — Progress Notes (Signed)
Patient was given 0.5 mg ativan sublingual per Dr. Sondra Come.  Will continue to monitor.

## 2016-04-05 NOTE — Patient Instructions (Signed)
Patient scheduled for surgery in the operating room for Friday, 04/08/2016 at 7:30 AM at [facility]. Patient was reminded be nothing by mouth after midnight for the operating room procedure. Dr. Enrique Sack

## 2016-04-05 NOTE — Progress Notes (Signed)
Radiation Oncology         [phone] ________________________________  Initial Outpatient Consultation  Name: Valerie Kerr MRN: [id number]  Date: 04/05/2016  DOB: May 08, 1960  [id number], MD  [name], MD   REFERRING PHYSICIAN: [name], MD  DIAGNOSIS: Stage IIB poorly differentiated squamous cell carcinoma of the cervix  HISTORY OF PRESENT ILLNESS::Valerie Kerr is a 56 y.o. female who had postmenopausal bleeding at the beginning of 2016 and continued to experience intermittent bleeding for 2 years. She presented to the ED on 02/20/16 for worsening lower back pain, right arm burning, left knee pain and swelling, and a week of vaginal bleeding. Physical exam at that time revealed a large irregular cervical mass.  The patient presented to Dr. Baron Sane of Obstetrics on 03/01/16. Exam at that time revealed a large, easily friable, mass covering the cervix os, the vaginal wall appeared to be normal, and there was dried blood in the vaginal vault. Biopsies of the cervix revealed infiltrative squamous cell carcinoma and invasive poorly differentiated squamous cell carcinoma. US of the pelvis on 03/03/16 showed the endometrium 1.4 cm in thickness and no focal abnormalities were visualized (however, the study was suboptimal due to bowel gas).  The patient was unable to undergo a PET scan on 03/15/16 due to claustrophobia and the fear of getting bad results. The patient saw Dr. [name] on 03/16/16 to discuss her pathology results and the need to see oncology.  The patient presented to Dr. [name] on 03/23/16. Physical exam at that time showed the cervix being completely replaced by a 6 cm exophytic friable mass that is also encroaching the upper vaginal fornices and the parametrium bilaterally. There was also right parametrial involvement. Dr. [name] discussed that a PET is required to evaluate for distant metastases and that the tumor is not contained by the cervix and therefore she is  not a candidate for surgical resection. She recommended weekly CCDP 40mg /m2 and external beam radiation with intracavitary brachytherapy. If distant metastases are seen on PET, then Dr. [name] felt that the patient would still benefit from pelvic RT as the patient has bleeding symptoms, but it would be palliative intent and the chemotherapy prescription would change.  The patient presented to Dr. Marko Plume on 04/01/16 who further discussed radiosensitizing chemotherapy (weekly CDDP) with radiation.  PET scan was performed on 04/05/15. This showed a 5.5 x 5.1 cm hypermetabolic soft tissue cervical mass with SUV max of [id number], no hypermetabolic lymph nodes in the abdomen or pelvis, and sub-cm hypermetabolic left axillary lymph nodes with SUV max of 6.7 that are likely reactive in etiology. The patient uses Xanax prior to exams for anxiety.  The patient and her daughter present today to discuss radiation for the management of her disease.  PREVIOUS RADIATION THERAPY: No  PAST MEDICAL HISTORY:  has a past medical history of Acid reflux; Cervical cancer ([place]); and Hypertension.    PAST SURGICAL HISTORY: Past Surgical History:  Procedure Laterality Date  . CARPAL TUNNEL RELEASE Bilateral   . CERVICAL DISC SURGERY      FAMILY HISTORY: family history includes Cancer in her brother, brother, father, and sister.  SOCIAL HISTORY:  reports that she has been smoking Cigarettes.  She has a 22.50 pack-year smoking history. She has never used smokeless tobacco. She reports that she does not drink alcohol or use drugs.  ALLERGIES: Codeine; Fentanyl; and Tramadol  MEDICATIONS:  Current Outpatient Prescriptions  Medication Sig Dispense Refill  . acetaminophen (TYLENOL) 500 MG tablet Take 500 mg by  mouth every 6 (six) hours as needed.    . ALPRAZolam (XANAX) 0.25 MG tablet Take 1 tablet (0.25 mg total) by mouth 2 (two) times daily as needed for anxiety. 4 tablet 0  . bisacodyl (DULCOLAX) 5 MG EC tablet Take 5-10 mg  by mouth daily as needed for moderate constipation.    . Ibuprofen 200 MG CAPS Take 1-2 capsules by mouth 2 (two) times daily as needed.    Marland Kitchen lisinopril-hydrochlorothiazide (PRINZIDE) 10-12.5 MG tablet Take 1 tablet by mouth daily. 30 tablet 0  . methadone (DOLOPHINE) 10 MG/5ML solution Take 55 mg by mouth daily.    Marland Kitchen omeprazole (PRILOSEC) 20 MG capsule Take 1 capsule (20 mg total) by mouth daily. (Patient taking differently: Take 20 mg by mouth daily as needed (acid reflux). ) 30 capsule 0   No current facility-administered medications for this encounter.    Facility-Administered Medications Ordered in Other Encounters  Medication Dose Route Frequency Provider Last Rate Last Dose  . fludeoxyglucose F - 18 (FDG) injection 7.6 millicurie  7.6 millicurie Intravenous Once PRN Rolm Baptise, MD        REVIEW OF SYSTEMS:  A 12 point review of systems is documented in the electronic medical record. This was obtained by the nursing staff. However, I reviewed this with the patient to discuss relevant findings and make appropriate changes.  Pertinent items noted in HPI and remainder of comprehensive ROS otherwise negative.   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 The pain starts in her upper back and radiates down to the lower back. The pain causes numbness in her right arm. She complains of left knee swelling. She denies prior trauma to this area. She has constipation from the methadone and she takes dulcolax for this. She denies diarrhea, hematochezia, nausea, or emesis. She reports nocturia x2. She reports clear vaginal discharge with occasional blood. She denies vaginal odor or itching. She denies changes in vision or headaches.   PHYSICAL EXAM:  height is 5\' 1"  (1.549 m) and weight is 158 lb 12.8 oz (72 kg). Her oral temperature is 98.2 F (36.8 C). Her blood pressure is 163/88 (abnormal) and her pulse is 101 (abnormal). Her respiration is 18 and oxygen saturation is 100%.     General: Alert and oriented. (+) The patient was tearful and anxious during the encounter. HEENT: Head is normocephalic. Extraocular movements are intact. Oropharynx is clear. Poor dentition Neck: Neck is supple, no palpable cervical or supraclavicular lymphadenopathy. Heart: Regular in rate and rhythm with no murmurs, rubs, or gallops. Chest: Clear to auscultation bilaterally, with no rhonchi, wheezes, or rales. Abdomen: Soft, nontender, nondistended, with no rigidity or guarding. Extremities: No cyanosis or edema. Lymphatics: see Neck Exam Skin: No concerning lesions. Musculoskeletal: symmetric strength and muscle tone throughout. Neurologic: Cranial nerves II through XII are grossly intact. No obvious focalities. Speech is fluent. Coordination is intact. Psychiatric: Judgment and insight are intact. Affect is appropriate.  PELVIC: Normal external genitalia. The patient has a cervical mass protruding halfway down the vaginal vault. Bleeds easily with manipulation. On bimanual and rectovaginal examination, the mass is approximately 6 cm in size. Question right parametrial involvement.  ECOG = 1   LABORATORY DATA:  Lab Results  Component Value Date   WBC 8.3 04/01/2016   HGB 13.6 04/01/2016   HCT 42.5 04/01/2016   MCV 86.2 04/01/2016   PLT 262 04/01/2016   NEUTROABS 4.7 04/01/2016   Lab Results  Component Value Date   NA  141 04/01/2016   K 3.6 04/01/2016   CL 99 (L) 02/20/2016   CO2 31 (H) 04/01/2016   GLUCOSE 191 (H) 04/01/2016   CREATININE 0.8 04/01/2016   CALCIUM 9.6 04/01/2016      RADIOGRAPHY: Nm Pet Image Initial (pi) Skull Base To Thigh  Result Date: 04/04/2016 CLINICAL DATA:  Initial Treatment strategy for newly diagnosed cervical carcinoma. EXAM: NUCLEAR MEDICINE PET SKULL BASE TO THIGH TECHNIQUE: 7.6 mCi F-18 FDG was injected intravenously. Full-ring PET imaging was performed from the skull base to thigh after the radiotracer. CT data was obtained and used for  attenuation correction and anatomic localization. FASTING BLOOD GLUCOSE:  Value: 98 mg/dl COMPARISON:  None. FINDINGS: NECK No hypermetabolic lymph nodes in the neck. CHEST No hypermetabolic mediastinal or hilar nodes. A few sub-cm left axillary lymph nodes are seen showing hypermetabolic activity, with highest SUV max of 6.7. No pathologically enlarged lymph nodes identified. No hypermetabolic lesions seen in either breast. No suspicious pulmonary nodules on the CT scan. ABDOMEN/PELVIS No abnormal hypermetabolic activity within the liver, pancreas, adrenal glands, or spleen. A hypermetabolic soft tissue mass is seen in the region of the cervix, measuring 5.5 x 5.1 cm on image 160/4. This mass shows SUV max of 21.8. No hypermetabolic lymph nodes in the abdomen or pelvis. Aortic atherosclerosis. SKELETON No focal hypermetabolic activity to suggest skeletal metastasis. IMPRESSION: 5.5 cm hypermetabolic cervical mass, consistent with known primary cervical carcinoma. No definite local or distant metastatic disease. Sub-cm hypermetabolic left axillary lymph nodes, likely reactive in etiology. Recommend continued attention on follow-up imaging. Electronically Signed   By: Earle Gell M.D.   On: 04/04/2016 09:48      IMPRESSION: 56 y.o. woman with stage II-B poorly differentiated squamous cell carcinoma of the cervix  The patient appears to be a good candidate for radiosensitizing chemotherapy and external beam radiation to the pelvis with intracavitary brachytherapy.The intracavitary brachytherapy would consist of 5 fractions using the ring and tandem.  Today, I spoke to the patient and her daughter about the findings and work-up thus far. We discussed the natural history of squamous cell carcinoma of the cervix and general treatment, highlighting the role of radiotherapy in the management.  We discussed the available radiation techniques, and focused on the details of logistics and delivery.  We reviewed the  anticipated acute and late sequelae associated with radiation in this setting. The patient was encouraged to ask questions that I answered to the best of my ability.  The patient would like to proceed with radiation and will be scheduled for CT simulation after this encounter.  PLAN: The patient signed a consent form and and a copy was placed in her medical chart. CT simulation has been scheduled after this encounter. The patient has anxiety from claustrophobia. Therefore we will give her Ativan 0.5 mg sublingual prior to CT simulation.  The patient is scheduled to begin radiosensitizing chemotherapy on 04/18/2016 by Dr. Marko Plume. I anticipate beginning radiation therapy next week and will try to move her chemotherapy up.  The patient and her daughter inquired about completing paperwork for FMLA. I will get social work involved with work issues.  The patient is also scheduled to see Dr. Enrique Sack later today regarding broken teeth and  ? dental abscesses.     ------------------------------------------------  [name], PhD, MD  This document serves as a record of services personally performed by [name], MD. It was created on his behalf by [name], a trained medical scribe. The creation  of this record is based on the scribe's personal observations and the provider's statements to them. This document has been checked and approved by the attending provider.

## 2016-04-05 NOTE — Progress Notes (Signed)
DENTAL CONSULTATION  Date of Consultation:  04/05/2016 Patient Name:   Valerie Kerr Date of Birth:   1960-12-29 Medical Record Number: [id number]  VITALS: BP 117/72 (BP Location: Left Arm)   Pulse 82   Temp 98.2 F (36.8 C) (Oral)   CHIEF COMPLAINT: Patient referred by Dr. Marko Plume for a dental consultation.  HPI: Stefani Gritter is a 56 year old female referred by Dr. Marko Plume for a dental consultation. Patient recently diagnosed with cervical cancer. Patient with anticipated chemoradiation therapy. Patient is now seen as part of a medically necessary pre-chemoradiation therapy dental protocol examination.  The patient currently denies acute toothaches, swellings, or abscesses. Patient does have a history of dental abscess approximately 2-3 months ago. Patient was seen by the Emergency Department for evaluation. Patient was prescribed antibiotics and pain medications. Patient did not follow-up with dental treatment with a dentist as recommended.  Patient does not have a primary dentist.  Patient indicates she is unable to afford dental treatment at this time. Patient denies having dental phobia.  PROBLEM LIST: Patient Active Problem List   Diagnosis Date Noted  . Cervical cancer ([place]) 03/03/2016    Priority: High  . Continuous tobacco abuse 04/01/2016  . Poor dentition 04/01/2016  . Essential hypertension 04/01/2016  . 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 Allergic sinusitis 04/01/2016  . Gastroesophageal reflux disease 04/01/2016    PMH: Past Medical History:  Diagnosis Date  . Acid reflux   . Cervical cancer ([place])   . Hypertension     PSH: Past Surgical History:  Procedure Laterality Date  . CARPAL TUNNEL RELEASE Bilateral   . CERVICAL DISC SURGERY      ALLERGIES: Allergies  Allergen Reactions  . Codeine     Other reaction(s): mild rash/itching  . Tramadol Other (See Comments)    GI upset  . Fentanyl Rash    Skin rash from patch    MEDICATIONS: Current  Outpatient Prescriptions  Medication Sig Dispense Refill  . acetaminophen (TYLENOL) 500 MG tablet Take 500 mg by mouth every 6 (six) hours as needed.    . ALPRAZolam (XANAX) 0.25 MG tablet Take 1 tablet (0.25 mg total) by mouth 2 (two) times daily as needed for anxiety. 4 tablet 0  . bisacodyl (DULCOLAX) 5 MG EC tablet Take 5-10 mg by mouth daily as needed for moderate constipation.    . Ibuprofen 200 MG CAPS Take 1-2 capsules by mouth 2 (two) times daily as needed.    Marland Kitchen lisinopril-hydrochlorothiazide (PRINZIDE) 10-12.5 MG tablet Take 1 tablet by mouth daily. 30 tablet 0  . methadone (DOLOPHINE) 10 MG/5ML solution Take 55 mg by mouth daily.    Marland Kitchen omeprazole (PRILOSEC) 20 MG capsule Take 1 capsule (20 mg total) by mouth daily. (Patient taking differently: Take 20 mg by mouth daily as needed (acid reflux). ) 30 capsule 0   No current facility-administered medications for this visit.    Facility-Administered Medications Ordered in Other Visits  Medication Dose Route Frequency Provider Last Rate Last Dose  . fludeoxyglucose F - 18 (FDG) injection 7.6 millicurie  7.6 millicurie Intravenous Once PRN Rolm Baptise, MD        LABS: Lab Results  Component Value Date   WBC 8.3 04/01/2016   HGB 13.6 04/01/2016   HCT 42.5 04/01/2016   MCV 86.2 04/01/2016   PLT 262 04/01/2016      Component Value Date/Time   NA 141 04/01/2016 1203   K 3.6 04/01/2016 1203   CL 99 (L) 02/20/2016 2106  CO2 31 (H) 04/01/2016 1203   GLUCOSE 191 (H) 04/01/2016 1203   BUN 13.2 04/01/2016 1203   CREATININE 0.8 04/01/2016 1203   CALCIUM 9.6 04/01/2016 1203   GFRNONAA >60 12/24/2015 2055   GFRAA >60 12/24/2015 2055   No results found for: INR, PROTIME No results found for: PTT  SOCIAL HISTORY: Social History   Social History  . Marital status: Divorced    Spouse name: N/A  . Number of children: 5  . Years of education: N/A   Occupational History  . Not on file.   Social History Main Topics  . Smoking  status: Current Every Day Smoker    Packs/day: 0.50    Years: 45.00    Types: Cigarettes  . Smokeless tobacco: Never Used  . Alcohol use No  . Drug use: No  . Sexual activity: Not Currently   Other Topics Concern  . Not on file   Social History Narrative  . No narrative on file    FAMILY HISTORY: Family History  Problem Relation Age of Onset  . Cancer Father   . Cancer Sister     throat  . Cancer Brother     throat and stomach  . Cancer Brother     lung    REVIEW OF SYSTEMS: Reviewed with the patient as per history of present illness.  Psych: Patient denies having dental phobia.  DENTAL HISTORY: CHIEF COMPLAINT: Patient referred by Dr. Marko Plume for a dental consultation.  HPI: Valerie Kerr is a 56 year old female referred by Dr. Marko Plume for a dental consultation. Patient recently diagnosed with cervical cancer. Patient with anticipated chemoradiation therapy. Patient is now seen as part of a medically necessary pre-chemoradiation therapy dental protocol examination.  The patient currently denies acute toothaches, swellings, or abscesses. Patient does have a history of dental abscess approximately 2-3 months ago. Patient was seen by the Emergency Department for evaluation. Patient was prescribed antibiotics and pain medications. Patient did not follow-up with dental treatment with a dentist as recommended.  Patient does not have a primary dentist.  Patient indicates she is unable to afford dental treatment at this time. Patient denies having dental phobia.  DENTAL EXAMINATION: GENERAL: The patient is a well-developed, well-nourished female in no acute distress. HEAD AND NECK: There is no palpable neck lymphadenopathy. The patient denies acute TMJ symptoms. INTRAORAL EXAM: The patient has normal saliva. There is no evidence of oral abscess formation. There is a palatal exostosis in the area of #14 through 16. Patient has bilateral mandibular lingual tori. DENTITION:  Patient is missing tooth numbers 2, 12, 14, and 15.  There are impacted wisdom teeth numbers 1, 16, 17, and 32. There are retained roots in the area tooth numbers 3, 9, 21, and 30. PERIODONTAL: The patient has chronic periodontitis with plaque and calculus accumulations, gingival recession, and no significant tooth mobility. There is incipient to moderate bone loss noted. DENTAL CARIES/SUBOPTIMAL RESTORATIONS: There are multiple dental caries noted as per dental charting form. ENDODONTIC: Patient currently denies acute pulpitis symptoms. Patient indicates that she does have a history of previous abscess formation. Patient has multiple areas of periapical pathology and radiolucency associated with tooth numbers 3, 9, 21, and 30. CROWN AND BRIDGE: There are no crown or bridge restorations. PROSTHODONTIC: Patient denies having partial dentures. OCCLUSION: Patient has a poor occlusal scheme secondary to multiple missing teeth, supra-eruption and drifting of the unopposed teeth into the edentulous areas, and lack of replacement of missing teeth with dental restorations.  RADIOGRAPHIC  INTERPRETATION: An orthopantogram was taken and supplemented with 13 periapical radiographs and 2 bite wings. There are multiple missing teeth. There are multiple retained root segments. There are impacted tooth numbers 1, 16, 17, and 32. Dental caries are noted. There are radiopacities consistent with bilateral mandibular lingual tori. There is incipient to moderate bone loss noted. Radiographic calculus is noted.  ASSESSMENTS: 1. Cervical cancer 2. Pre-chemoradiation therapy dental protocol examination 3. History of acute pulpitis symptoms 4. Chronic apical periodontitis 5. Multiple dental caries 6. Multiple retained root segments 7. Chronic periodontitis with bone loss 8. Accretions 9. Gingival recession 10. Mandibular anterior incipient tooth mobility 11. Multiple missing teeth 12. Supra-eruption and drifting of  the unopposed teeth into the edentulous areas 13. Impacted wisdom teeth numbers 1, 16, 17, and 32. 14. Poor occlusal scheme and malocclusion   PLAN/RECOMMENDATIONS: 1. I discussed the risks, benefits, and complications of various treatment options with the patient in relationship to her medical and dental conditions, anticipated chemoradiation therapy, and chemoradiation therapy side effects to include xerostomia, dental caries, taste changes, mucositis, and risk for infection. We discussed various treatment options to include no treatment, multiple extractions with alveoloplasty, pre-prosthetic surgery as indicated, periodontal therapy, dental restorations, root canal therapy, crown and bridge therapy, implant therapy, and replacement of missing teeth as indicated. We also discussed referral to an oral surgeon for extraction of tooth numbers 1, 16, 17, and 32 due to the complexity of these dental extractions. The patient currently wishes to proceed with extraction of tooth numbers 3, 9, 21, 30, and 31 with alveoloplasty and gross debridement of remaining dentition in the operating with general anesthesia. Patient will then be ready for the chemoradiation therapy for her cervical cancer. Once medically stable from her chemoradiation therapy, the patient will then follow-up with the general dentist of her choice for additional dental restorations and evaluation for replacement of missing teeth as indicated. Patient will then also follow-up with an oral surgeon for evaluation for extraction of wisdom teeth as indicated.   2. Discussion of findings with medical team and coordination of future medical and dental care as needed.  I spent in excess of  120 minutes during the conduct of this consultation and >50% of this time involved direct face-to-face encounter for counseling and/or coordination of the patient's care.    Lenn Cal, DDS

## 2016-04-05 NOTE — Progress Notes (Signed)
Met with patient and daughter to reinforce what we discussed for [facility] FAA application documents. Advised what was needed(bank statements and food stamp award letter). Wrote down for patient and daughter. They advised that they would try to bring documents back today.   Patient was asking about forms for physician to complete. I advised patient to contact her HR at work to discuss what she is eligible for and to bring any paperwork she needs completed. Louise(RN) explained to patient as well.   Approved patient for one-time $400 grant and explained this would be solely for her medications. Patient states what if she needs to get gas. Advised patient gas cards are a part of the grant expenses that are covered and she may use them if needed, but this will deduct from her grant. Advised patient that there may be additional funding to help with transportation but we will discuss late. Patient was very anxious.  Patient and daughter have my card for any additional financial questions or concerns.

## 2016-04-06 ENCOUNTER — Encounter: Payer: Self-pay | Admitting: Oncology

## 2016-04-06 MED FILL — PROCHLORPERAZINE 10 MG TAB: 10 | 5 days supply | Qty: 20 | Fill #0

## 2016-04-06 NOTE — Progress Notes (Signed)
Patient came in today to bring remaining supporting documents for [place]. Placed in outer office mail to the business office Attn: Customer Service. Patient also received gas card from grant. Patient has my card for any additional financial questions or concerns.

## 2016-04-07 ENCOUNTER — Encounter (HOSPITAL_COMMUNITY): Payer: Self-pay | Admitting: *Deleted

## 2016-04-07 ENCOUNTER — Encounter: Payer: Self-pay | Admitting: *Deleted

## 2016-04-07 ENCOUNTER — Encounter: Payer: Self-pay | Admitting: Oncology

## 2016-04-07 NOTE — Progress Notes (Signed)
Valerie Kerr  Clinical Social Kerr was referred by Futures trader for assessment of psychosocial needs.  Clinical Social Worker contacted patient by phone to offer support and assess for needs.  Valerie Kerr shared she has recently been very anxious regarding her cancer diagnosis/treatment, but is feeling much better today after meeting with her physician at the methadone clinic she attends.  The patient reported she receives emotional support from the team at the methadone clinic(she has been going there about a year for chronic pain) and from her children.  Valerie Kerr has five children and many grandchildren.  She shared she wants to "live for her grandchildren"- expressed they give her hope and she enjoys quality time with them.  CSW plans to follow up with patient in infusion room next week.   Polo Riley, MSW, LCSW, OSW-C Clinical Social Worker [facility] [phone]

## 2016-04-07 NOTE — Progress Notes (Signed)
Pt denies SOB, chest pain, and being under the care of a cardiologist. Pt denies having a stress test, echo and cardiac cath.. Pt made aware to stop taking Aspirin, vitamins, fish oil and herbal medications. Do not take any NSAIDs ie: Ibuprofen, Advil, Naproxen, BC and Goody Powder or any medication containing Aspirin. Pt stated that she does not take her Methadone " that early in the morning, I usually take it around 10:00 or after I get to work, " when given pre-op instructions on medications to take the morning of surgery. Pt verbalized understanding of pre-op instructions.

## 2016-04-07 NOTE — Anesthesia Preprocedure Evaluation (Addendum)
Anesthesia Evaluation  Patient identified by MRN, date of birth, ID band Patient awake    Reviewed: Allergy & Precautions, NPO status , Patient's Chart, lab work & pertinent test results  Airway Mallampati: I       Dental  (+) Poor Dentition   Pulmonary Current Smoker,    Pulmonary exam normal        Cardiovascular hypertension, Pt. on medications Normal cardiovascular exam     Neuro/Psych    GI/Hepatic Neg liver ROS, GERD  Medicated and Controlled,  Endo/Other    Renal/GU negative Renal ROS  negative genitourinary   Musculoskeletal   Abdominal Normal abdominal exam  (+)   Peds  Hematology negative hematology ROS (+)   Anesthesia Other Findings   Reproductive/Obstetrics negative OB ROS                            Anesthesia Physical Anesthesia Plan  ASA: II  Anesthesia Plan: General   Post-op Pain Management:    Induction: Intravenous  Airway Management Planned: Oral ETT  Additional Equipment:   Intra-op Plan:   Post-operative Plan: Extubation in OR  Informed Consent: I have reviewed the patients History and Physical, chart, labs and discussed the procedure including the risks, benefits and alternatives for the proposed anesthesia with the patient or authorized representative who has indicated his/her understanding and acceptance.     Plan Discussed with: CRNA and Surgeon  Anesthesia Plan Comments:        Anesthesia Quick Evaluation

## 2016-04-08 ENCOUNTER — Encounter (HOSPITAL_COMMUNITY): Payer: Self-pay | Admitting: *Deleted

## 2016-04-08 ENCOUNTER — Telehealth: Payer: Self-pay | Admitting: Oncology

## 2016-04-08 ENCOUNTER — Ambulatory Visit (HOSPITAL_COMMUNITY): Payer: Self-pay | Admitting: Anesthesiology

## 2016-04-08 ENCOUNTER — Encounter (HOSPITAL_COMMUNITY)
Admission: RE | Disposition: A | Discharge status: Home / Self Care | Payer: Self-pay | Source: Ambulatory Visit | Attending: Dentistry

## 2016-04-08 ENCOUNTER — Ambulatory Visit (HOSPITAL_COMMUNITY)
Admission: RE | Admit: 2016-04-08 | Discharge: 2016-04-08 | Disposition: A | Discharge status: Home / Self Care | Payer: Self-pay | Source: Ambulatory Visit | Attending: Dentistry | Admitting: Dentistry

## 2016-04-08 DIAGNOSIS — Z885 Allergy status to narcotic agent status: Secondary | ICD-10-CM | POA: Insufficient documentation

## 2016-04-08 DIAGNOSIS — K029 Dental caries, unspecified: Secondary | ICD-10-CM | POA: Insufficient documentation

## 2016-04-08 DIAGNOSIS — G8929 Other chronic pain: Secondary | ICD-10-CM | POA: Insufficient documentation

## 2016-04-08 DIAGNOSIS — J309 Allergic rhinitis, unspecified: Secondary | ICD-10-CM | POA: Insufficient documentation

## 2016-04-08 DIAGNOSIS — Z9114 Patient's other noncompliance with medication regimen: Secondary | ICD-10-CM | POA: Insufficient documentation

## 2016-04-08 DIAGNOSIS — Z79891 Long term (current) use of opiate analgesic: Secondary | ICD-10-CM | POA: Insufficient documentation

## 2016-04-08 DIAGNOSIS — M549 Dorsalgia, unspecified: Secondary | ICD-10-CM | POA: Insufficient documentation

## 2016-04-08 DIAGNOSIS — F17210 Nicotine dependence, cigarettes, uncomplicated: Secondary | ICD-10-CM | POA: Insufficient documentation

## 2016-04-08 DIAGNOSIS — K0530 Chronic periodontitis, unspecified: Secondary | ICD-10-CM

## 2016-04-08 DIAGNOSIS — I10 Essential (primary) hypertension: Secondary | ICD-10-CM | POA: Insufficient documentation

## 2016-04-08 DIAGNOSIS — K083 Retained dental root: Secondary | ICD-10-CM | POA: Insufficient documentation

## 2016-04-08 DIAGNOSIS — C539 Malignant neoplasm of cervix uteri, unspecified: Secondary | ICD-10-CM | POA: Insufficient documentation

## 2016-04-08 DIAGNOSIS — K036 Deposits [accretions] on teeth: Secondary | ICD-10-CM

## 2016-04-08 DIAGNOSIS — Z9889 Other specified postprocedural states: Secondary | ICD-10-CM | POA: Insufficient documentation

## 2016-04-08 DIAGNOSIS — F419 Anxiety disorder, unspecified: Secondary | ICD-10-CM | POA: Insufficient documentation

## 2016-04-08 DIAGNOSIS — K219 Gastro-esophageal reflux disease without esophagitis: Secondary | ICD-10-CM | POA: Insufficient documentation

## 2016-04-08 DIAGNOSIS — K045 Chronic apical periodontitis: Secondary | ICD-10-CM | POA: Insufficient documentation

## 2016-04-08 DIAGNOSIS — K5900 Constipation, unspecified: Secondary | ICD-10-CM | POA: Insufficient documentation

## 2016-04-08 HISTORY — PX: MULTIPLE EXTRACTIONS WITH ALVEOLOPLASTY: SHX5342

## 2016-04-08 HISTORY — DX: Other allergy status, other than to drugs and biological substances: Z91.09

## 2016-04-08 HISTORY — DX: Anxiety disorder, unspecified: F41.9

## 2016-04-08 HISTORY — DX: Other cervical disc displacement, unspecified cervical region: M50.20

## 2016-04-08 HISTORY — DX: Headache, unspecified: R51.9

## 2016-04-08 HISTORY — DX: Headache: R51

## 2016-04-08 LAB — CBC
HEMATOCRIT: 39.5 % (ref 36.0–46.0)
HEMOGLOBIN: 12.6 g/dL (ref 12.0–15.0)
MCH: 27.3 pg (ref 26.0–34.0)
MCHC: 31.9 g/dL (ref 30.0–36.0)
MCV: 85.7 fL (ref 78.0–100.0)
Platelets: 247 10*3/uL (ref 150–400)
RBC: 4.61 MIL/uL (ref 3.87–5.11)
RDW: 13.5 % (ref 11.5–15.5)
WBC: 9.6 10*3/uL (ref 4.0–10.5)

## 2016-04-08 LAB — BASIC METABOLIC PANEL
Anion gap: 10 (ref 5–15)
BUN: 11 mg/dL (ref 6–20)
CHLORIDE: 100 mmol/L — AB (ref 101–111)
CO2: 28 mmol/L (ref 22–32)
Calcium: 9.2 mg/dL (ref 8.9–10.3)
Creatinine, Ser: 0.6 mg/dL (ref 0.44–1.00)
GFR calc Af Amer: >60 mL/min (ref >60)
GFR calc non Af Amer: >60 mL/min (ref >60)
GLUCOSE: 83 mg/dL (ref 65–99)
POTASSIUM: 3.5 mmol/L (ref 3.5–5.1)
SODIUM: 138 mmol/L (ref 135–145)

## 2016-04-08 SURGERY — MULTIPLE EXTRACTION WITH ALVEOLOPLASTY
Anesthesia: General

## 2016-04-08 MED ORDER — PROPOFOL 10 MG/ML IV BOLUS
INTRAVENOUS | Status: DC | PRN
  Administered 2016-04-08: 200 mg via INTRAVENOUS

## 2016-04-08 MED ORDER — LACTATED RINGERS IV SOLN
INTRAVENOUS | Status: DC | PRN
  Administered 2016-04-08: 07:00:00 via INTRAVENOUS

## 2016-04-08 MED ORDER — DIPHENHYDRAMINE HCL 50 MG/ML IJ SOLN
INTRAMUSCULAR | Status: AC
  Filled 2016-04-08: qty 1

## 2016-04-08 MED ORDER — DEXAMETHASONE SODIUM PHOSPHATE 10 MG/ML IJ SOLN
INTRAMUSCULAR | Status: AC
  Filled 2016-04-08: qty 1

## 2016-04-08 MED ORDER — LIDOCAINE HCL (CARDIAC) 20 MG/ML IV SOLN
INTRAVENOUS | Status: DC | PRN
  Administered 2016-04-08: 100 mg via INTRAVENOUS

## 2016-04-08 MED ORDER — DEXAMETHASONE SODIUM PHOSPHATE 10 MG/ML IJ SOLN
INTRAMUSCULAR | Status: DC | PRN
  Administered 2016-04-08: 10 mg via INTRAVENOUS

## 2016-04-08 MED ORDER — MIDAZOLAM HCL 2 MG/2ML IJ SOLN
INTRAMUSCULAR | Status: AC
  Filled 2016-04-08: qty 2

## 2016-04-08 MED ORDER — BUPIVACAINE-EPINEPHRINE 0.5% -1:200000 IJ SOLN
INTRAMUSCULAR | Status: DC | PRN
  Administered 2016-04-08: 3.6 mL

## 2016-04-08 MED ORDER — LIDOCAINE-EPINEPHRINE 2 %-1:100000 IJ SOLN
INTRAMUSCULAR | Status: DC | PRN
  Administered 2016-04-08: 5.1 mL

## 2016-04-08 MED ORDER — STERILE WATER FOR IRRIGATION IR SOLN
Status: DC | PRN
  Administered 2016-04-08: 1000 mL

## 2016-04-08 MED ORDER — FENTANYL CITRATE (PF) 100 MCG/2ML IJ SOLN
INTRAMUSCULAR | Status: DC | PRN
  Administered 2016-04-08 (×2): 50 ug via INTRAVENOUS

## 2016-04-08 MED ORDER — ROCURONIUM BROMIDE 100 MG/10ML IV SOLN
INTRAVENOUS | Status: DC | PRN
  Administered 2016-04-08: 30 mg via INTRAVENOUS

## 2016-04-08 MED ORDER — OXYMETAZOLINE HCL 0.05 % NA SOLN
NASAL | Status: AC
  Filled 2016-04-08: qty 15

## 2016-04-08 MED ORDER — OXYMETAZOLINE HCL 0.05 % NA SOLN
NASAL | Status: DC | PRN
  Administered 2016-04-08: 1 via TOPICAL

## 2016-04-08 MED ORDER — CEFAZOLIN SODIUM-DEXTROSE 2-4 GM/100ML-% IV SOLN: 2.0000 g | Freq: Once | INTRAVENOUS | Status: DC

## 2016-04-08 MED ORDER — ONDANSETRON HCL 4 MG/2ML IJ SOLN
INTRAMUSCULAR | Status: AC
  Filled 2016-04-08: qty 2

## 2016-04-08 MED ORDER — PROPOFOL 10 MG/ML IV BOLUS
INTRAVENOUS | Status: AC
  Filled 2016-04-08: qty 20

## 2016-04-08 MED ORDER — LIDOCAINE-EPINEPHRINE 2 %-1:100000 IJ SOLN
INTRAMUSCULAR | Status: AC
  Filled 2016-04-08: qty 10.2

## 2016-04-08 MED ORDER — IBUPROFEN 600 MG PO TABS: ORAL_TABLET | ORAL | 0 refills | Status: DC

## 2016-04-08 MED ORDER — DIPHENHYDRAMINE HCL 50 MG/ML IJ SOLN
INTRAMUSCULAR | Status: DC | PRN
  Administered 2016-04-08: 25 mg via INTRAVENOUS

## 2016-04-08 MED ORDER — ROCURONIUM BROMIDE 50 MG/5ML IV SOSY
PREFILLED_SYRINGE | INTRAVENOUS | Status: AC
  Filled 2016-04-08: qty 5

## 2016-04-08 MED ORDER — HEMOSTATIC AGENTS (NO CHARGE) OPTIME
TOPICAL | Status: DC | PRN
  Administered 2016-04-08: 1 via TOPICAL

## 2016-04-08 MED ORDER — FENTANYL CITRATE (PF) 100 MCG/2ML IJ SOLN
INTRAMUSCULAR | Status: AC
  Filled 2016-04-08: qty 2

## 2016-04-08 MED ORDER — NEOSTIGMINE METHYLSULFATE 10 MG/10ML IV SOLN
INTRAVENOUS | Status: DC | PRN
  Administered 2016-04-08: 3 mg via INTRAVENOUS

## 2016-04-08 MED ORDER — BUPIVACAINE-EPINEPHRINE (PF) 0.5% -1:200000 IJ SOLN
INTRAMUSCULAR | Status: AC
  Filled 2016-04-08: qty 3.6

## 2016-04-08 MED ORDER — LIDOCAINE 2% (20 MG/ML) 5 ML SYRINGE
INTRAMUSCULAR | Status: AC
  Filled 2016-04-08: qty 5

## 2016-04-08 MED ORDER — 0.9 % SODIUM CHLORIDE (POUR BTL) OPTIME
TOPICAL | Status: DC | PRN
  Administered 2016-04-08: 1000 mL

## 2016-04-08 MED ORDER — ONDANSETRON HCL 4 MG/2ML IJ SOLN
INTRAMUSCULAR | Status: DC | PRN
  Administered 2016-04-08: 4 mg via INTRAVENOUS

## 2016-04-08 MED ORDER — MIDAZOLAM HCL 5 MG/5ML IJ SOLN
INTRAMUSCULAR | Status: DC | PRN
  Administered 2016-04-08: 2 mg via INTRAVENOUS

## 2016-04-08 MED ORDER — GLYCOPYRROLATE 0.2 MG/ML IJ SOLN
INTRAMUSCULAR | Status: DC | PRN
  Administered 2016-04-08: 0.4 mg via INTRAVENOUS

## 2016-04-08 SURGICAL SUPPLY — 37 items
ALCOHOL 70% 16 OZ (MISCELLANEOUS) ×3 IMPLANT
ATTRACTOMAT 16X20 MAGNETIC DRP (DRAPES) ×3 IMPLANT
BLADE SURG 15 STRL LF DISP TIS (BLADE) ×2 IMPLANT
BLADE SURG 15 STRL SS (BLADE) ×6
COVER SURGICAL LIGHT HANDLE (MISCELLANEOUS) ×3 IMPLANT
GAUZE PACKING FOLDED 2  STR (GAUZE/BANDAGES/DRESSINGS) ×2
GAUZE PACKING FOLDED 2 STR (GAUZE/BANDAGES/DRESSINGS) ×1 IMPLANT
GAUZE SPONGE 4X4 16PLY XRAY LF (GAUZE/BANDAGES/DRESSINGS) ×3 IMPLANT
GLOVE BIOGEL PI IND STRL 6 (GLOVE) ×1 IMPLANT
GLOVE BIOGEL PI INDICATOR 6 (GLOVE) ×2
GLOVE SURG ORTHO 8.0 STRL STRW (GLOVE) ×3 IMPLANT
GLOVE SURG SS PI 6.0 STRL IVOR (GLOVE) ×3 IMPLANT
GOWN STRL REUS W/ TWL LRG LVL3 (GOWN DISPOSABLE) ×1 IMPLANT
GOWN STRL REUS W/TWL 2XL LVL3 (GOWN DISPOSABLE) ×3 IMPLANT
GOWN STRL REUS W/TWL LRG LVL3 (GOWN DISPOSABLE) ×3
HEMOSTAT SURGICEL 2X14 (HEMOSTASIS) ×1 IMPLANT
KIT BASIN OR (CUSTOM PROCEDURE TRAY) ×3 IMPLANT
KIT ROOM TURNOVER OR (KITS) ×3 IMPLANT
MANIFOLD NEPTUNE WASTE (CANNULA) ×3 IMPLANT
NDL BLUNT 16X1.5 OR ONLY (NEEDLE) ×1 IMPLANT
NEEDLE BLUNT 16X1.5 OR ONLY (NEEDLE) ×3 IMPLANT
NS IRRIG 1000ML POUR BTL (IV SOLUTION) ×3 IMPLANT
PACK EENT II TURBAN DRAPE (CUSTOM PROCEDURE TRAY) ×3 IMPLANT
PAD ARMBOARD 7.5X6 YLW CONV (MISCELLANEOUS) ×3 IMPLANT
SPONGE SURGIFOAM ABS GEL 100 (HEMOSTASIS) ×2 IMPLANT
SPONGE SURGIFOAM ABS GEL 12-7 (HEMOSTASIS) IMPLANT
SPONGE SURGIFOAM ABS GEL SZ50 (HEMOSTASIS) IMPLANT
SUCTION FRAZIER HANDLE 10FR (MISCELLANEOUS) ×2
SUCTION TUBE FRAZIER 10FR DISP (MISCELLANEOUS) ×1 IMPLANT
SUT CHROMIC 3 0 PS 2 (SUTURE) ×6 IMPLANT
SUT CHROMIC 4 0 P 3 18 (SUTURE) ×2 IMPLANT
SYR 50ML SLIP (SYRINGE) ×3 IMPLANT
TOWEL OR 17X26 10 PK STRL BLUE (TOWEL DISPOSABLE) ×3 IMPLANT
TUBE CONNECTING 12'X1/4 (SUCTIONS) ×1
TUBE CONNECTING 12X1/4 (SUCTIONS) ×2 IMPLANT
WATER TABLETS ICX (MISCELLANEOUS) ×3 IMPLANT
YANKAUER SUCT BULB TIP NO VENT (SUCTIONS) ×3 IMPLANT

## 2016-04-08 NOTE — Transfer of Care (Signed)
Immediate Anesthesia Transfer of Care Note  Patient: Valerie Kerr  Procedure(s) Performed: Procedure(s): Extraction of tooth #'s [phone] with alveoloplasty  and gross debridement of remaining teeth (N/A)  Patient Location: PACU  Anesthesia Type:General  Level of Consciousness: Sedated  Airway & Oxygen Therapy: Natural,face mask 2l O2  Post-op Assessment: Stable  Post vital signs: VSS   Last Vitals:  Vitals:   04/08/16 0608 04/08/16 0900  BP: 118/73 129/71  Pulse: 77 96  Resp: 18 16  Temp: 37 C 36.2 C    Last Pain:  Vitals:   04/08/16 0900  TempSrc:   PainSc: 0-No pain      Patients Stated Pain Goal: 5 ([id number])  Complications: None

## 2016-04-08 NOTE — Transfer of Care (Signed)
Immediate Anesthesia Transfer of Care Note  Patient: Valerie Kerr  Procedure(s) Performed: Procedure(s): Extraction of tooth #'s [phone] with alveoloplasty  and gross debridement of remaining teeth (N/A)  Patient Location: PACU  Anesthesia Type:General  Level of Consciousness: responds to stimulation  Airway & Oxygen Therapy: Patient Spontanous Breathing and Patient connected to face mask oxygen  Post-op Assessment: Report given to RN and Post -op Vital signs reviewed and stable  Post vital signs: Reviewed and stable  Last Vitals:  Vitals:   04/08/16 0608 04/08/16 0900  BP: 118/73 129/71  Pulse: 77 96  Resp: 18 16  Temp: 37 C 36.2 C    Last Pain:  Vitals:   04/08/16 0900  TempSrc:   PainSc: 0-No pain      Patients Stated Pain Goal: 5 ([id number])  Complications: No apparent anesthesia complications

## 2016-04-08 NOTE — Anesthesia Postprocedure Evaluation (Addendum)
Anesthesia Post Note  Patient: Valerie Kerr  Procedure(s) Performed: Procedure(s) (LRB): Extraction of tooth #'s [phone] with alveoloplasty  and gross debridement of remaining teeth (N/A)  Anesthesia Type: General        Last Vitals:  Vitals:   04/08/16 0608 04/08/16 0900  BP: 118/73 129/71  Pulse: 77 96  Resp: 18 16  Temp: 37 C 36.2 C    Last Pain:  Vitals:   04/08/16 0900  TempSrc:   PainSc: 0-No pain   Pain Goal: Patients Stated Pain Goal: 5 (04/08/16 0620)               [place]

## 2016-04-08 NOTE — H&P (Signed)
04/08/2016  Patient:            Valerie Kerr Date of Birth:  06-Dec-1960 MRN:                [id number]   BP 118/73   Pulse 77   Temp 98.6 F (37 C) (Oral)   Resp 18   Ht 5' 1"  (1.549 m)   Wt 158 lb (71.7 kg)   SpO2 96%   BMI 29.85 kg/m    Valerie Kerr presents for multiple dental extractions with alveoloplasty and gross treatment I remained dentition abdomen with general anesthesia. Patient denies any acute medical or dental changes. Patient notes from Dr. Marko Kerr dated 04/01/2016 to act as H&P for dental operating room procedure.  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 female who is seen in consultation, together with daughter Valerie Kerr , at the request of Dr Valerie Kerr, with recently diagnosed squamous cell carcinoma of cervix. Disease is IIB by Dr Valerie Kerr exam, with PET pending 04-04-16.  She will see Dr Valerie Kerr in consultation also on 04-04-16.  Patient and daughter attended chemotherapy education class 03-31-16  Patient presented to ED 02-20-16 with postmenopausal bleeding which had been intermittent x 2 years. She was seen by gyn with cervical biopsy 03-01-16 showing poorly differentiated squamous cell carcinoma. There was difficulty reaching patient by phone with those results; she did have follow up with  Dr Valerie Kerr on 03-16-16, but cancelled planned PET due to anxiety.  She saw Dr Valerie Kerr on 03-23-16, with cervix replaced by 6 cm friable mass encroaching on upper vaginal fornices and right parametrial involvement. Staging PET has been rescheduled to 04-04-16.   REVIEW OF SYSTEMS: Patient denies significant vaginal bleeding  since initial biopsy, does report vaginal discharge. No pelvic pain, no LE swelling, some constipation which is not new, no bladder symptoms, no abdominal pain. She is at usual weight, appetite not good due to anxiety about this illness, no nausea or vomiting, some GERD for which she uses prn prilosec. Nasal congestion with chronic allergic type sinusitis, no ear pain, no sore throat. Denies SOB, cough, sputum, chest pain. No difficulty with hearing. Some HA with sinus congestion. Vision ok with glasses. Some broken teeth, with dental "abscess" treated with antibiotics from ED, timing of that not clear.  No known thyroid disease. Reports chronic neck and back pain from disc disease. Some arthritis hands and left knee. Sleeping poorly due to anxiety about this illness. No recent infectious illness Remainder of full 10 point review of systems negative.   ALLERGIES: Codeine; Fentanyl; Hydrocodone-acetaminophen; and Tramadol  PAST MEDICAL/ SURGICAL HISTORY:    Cervical disc surgery done in [place] surgery bilaterally HTN, not compliant with meds but still using prescriptions from MD in [place] GERD Followed at [facility] x >1 year on methadone "for back pain", presently allowed up to 5 days at a time (initially dispenses daily)  No PCP. Uses ED for dental and sinus problems.   CURRENT MEDICATIONS: reviewed as listed now in EMR Has used flonase and claritin equivalents for sinus congestion previously,  helpful if she uses daily. Flu vaccine given today (04-01-16) Will use compazine for chemo nausea   SOCIAL HISTORY:  Originally from [place], has been in [place] x 1.5 years, lives with daughter Valerie Kerr and her children, including 2 yo Valerie Kerr. Ongoing smoker x 45 years, presently <=1/2 ppd, interested in stopping. Denies ETOH, denies drugs. 1 son also in [place], 2 daughters in [place] and 1 son in [place], [name]. Works in Actuary at [employer].  No insurance,  has met with [facility] financial counselors today.   FAMILY HISTORY:  No known cancer    PHYSICAL EXAM:  height is 5' 1"  (1.549 m) and weight is 157 lb 11.2 oz (71.5 kg). Her oral temperature is 98.6 F (37 C). Her blood pressure is 152/82 (abnormal) and her pulse is 95. Her respiration is 17 and oxygen saturation is 97%.  Alert, anxious, cooperative lady,  looks stated age. Nasally congested. Daughter supportive, on phone with other daughters during visit.   HEENT:normal hair pattern. PERRL, not icteric. TMs obscured with cerumen bilaterally. Nasal turbinates some erythema, no purulent drainage. Posterior pharynx dull erythema without exudate. Mucous membranes moist. Broken teeth right lower and upper, poor dentition. Neck supple without JVD or thyroid mass  RESPIRATORY: diminished BS throughout without wheezes or rales. No dullness to percussion. Respirations not labored walking  CARDIAC/ VASCULAR: Heart RRR no gallop. Peripheral pulses symmetrical. UE veins appear adequate to begin chemo.  ABDOMEN: full, few BS, soft, not tender. No appreciable HSM or mass  LYMPH NODES: no cervical, supraclavicular, axillary or inguinal adenopathy  BREASTS: bilaterally without dominant mass or nipple/ skin findings of concern.  NEUROLOGIC: Speech fluent and appropriate. CN, motor, sensory, cerebellar nonfocal.   PSYCH expresses anxiety, a little tearful at times  SKIN: somewhat dry, no rash, ecchymoses, petechiae  MUSCULOSKELETAL: back not tender. No LE swelling. Symmetrical muscle mass    LABORATORY DATA:  LabResultsLast48Hours        Results for orders placed or performed in visit on 04/01/16 (from the past 48 hour(s))  CBC with Differential     Status: None   Collection Time: 04/01/16 12:03 PM  Result Value Ref Range   WBC 8.3 3.9 - 10.3 10e3/uL   NEUT# 4.7 1.5 - 6.5 10e3/uL   HGB 13.6 11.6 - 15.9 g/dL   HCT 42.5 34.8 - 46.6 %   Platelets 262 145 - 400 10e3/uL    MCV 86.2 79.5 - 101.0 fL   MCH 27.6 25.1 - 34.0 pg   MCHC 32.1 31.5 - 36.0 g/dL   RBC 4.93 3.70 - 5.45 10e6/uL   RDW 13.8 11.2 - 14.5 %   lymph# 3.1 0.9 - 3.3 10e3/uL   MONO# 0.4 0.1 - 0.9 10e3/uL   Eosinophils Absolute 0.1 0.0 - 0.5 10e3/uL   Basophils Absolute 0.0 0.0 - 0.1 10e3/uL   NEUT% 56.8 38.4 - 76.8 %   LYMPH% 36.8 14.0 - 49.7 %   MONO% 4.9 0.0 - 14.0 %   EOS% 1.0 0.0 - 7.0 %   BASO% 0.5 0.0 - 2.0 %  Iron and TIBC     Status: None   Collection Time: 04/01/16 12:03 PM  Result Value Ref Range   Iron 137 41 - 142 ug/dL   TIBC 305 236 - 444 ug/dL   UIBC 168 120 - 384 ug/dL   %SAT 45 21 - 57 %  Ferritin     Status: None   Collection Time: 04/01/16 12:03 PM  Result Value Ref  Range   Ferritin 79 9 - 269 ng/ml  Comprehensive metabolic panel     Status: Abnormal   Collection Time: 04/01/16 12:03 PM  Result Value Ref Range   Sodium 141 136 - 145 mEq/L   Potassium 3.6 3.5 - 5.1 mEq/L   Chloride 100 98 - 109 mEq/L   CO2 31 (H) 22 - 29 mEq/L   Glucose 191 (H) 70 - 140 mg/dl    Comment: Glucose reference range is for nonfasting patients. Fasting glucose reference range is 70- 100.   BUN 13.2 7.0 - 26.0 mg/dL   Creatinine 0.8 0.6 - 1.1 mg/dL   Total Bilirubin 0.56 0.20 - 1.20 mg/dL   Alkaline Phosphatase 94 40 - 150 U/L   AST 23 5 - 34 U/L   ALT 25 0 - 55 U/L   Total Protein 7.8 6.4 - 8.3 g/dL   Albumin 4.0 3.5 - 5.0 g/dL   Calcium 9.6 8.4 - 10.4 mg/dL   Anion Gap 9 3 - 11 mEq/L   EGFR >90 >90 ml/min/1.73 m2    Comment: eGFR is calculated using the CKD-EPI Creatinine Equation (2009)  Magnesium     Status: None   Collection Time: 04/01/16 12:03 PM  Result Value Ref Range   Magnesium 1.9 1.5 - 2.5 mg/dl       CBC and CMET resulted at time of visit and discussed.  PATHOLOGY: REYAH, STREETER Collected: 03/01/2016 Client: [facility] Accession: [id number] Received: 03/02/2016 Jacquiline Doe,  [place] OF SURGICAL PATHOLOGY FINAL DIAGNOSIS Diagnosis 1. Cervix, biopsy, mass INFILTRATIVE SQUAMOUS CELL CARCINOMA 2. Cervix, biopsy INVASIVE POORLY DIFFERENTIATED SQUAMOUS CELL CARCINOMA  RADIOGRAPHY: CXR 12-24-15 showed lungs clear.   CT maxillofacial 06-2016 with bilateral dental disease  PET scheduled for 04-04-16  DISCUSSION History as above reviewed with patient and daughter, including differences in chemotherapy and radiation therapy treatments, and rationale for sensitizing chemotherapy with radiation.  She is particularly anxious about results of the upcoming PET,  has ativan from Dr Valerie Kerr to use just prior to PET scan. We have discussed weekly CDDP with radiation, including need for po and IV hydration, length of treatments and use of antiemetics. They understand that chemo will be given probably on Mondays during weeks of radiation.  Verbal consent for chemo.  She does not appear to have bacterial sinusitis. I have recommended that she resume flonase spray and lortadine daily. She is to return information to financial counselors, may be eligible for financial assistance from [employer].   She is in agreement with referral to [place]; I have spoken directly with that office now.  She would like referral to PCP, may be possible thru [facility] and Wellness clinic, however I am not able to reach that office now.  Encouraged tobacco cessation, will need to follow up   IMPRESSION / PLAN:  1.Poorly differentiated squamous cell carcinoma of cervix: IIB by gyn oncology exam, PET pending 04-04-16. Plan sensitizing CDDP with radiation, tho plan may be adjusted depending on PET information. Consultation with Dr Valerie Kerr 04-04-16. No heavy vaginal bleeding now, and no other urgent symptoms from the cervical cancer. Chemo and other appointments with medical oncology to be set up when radiation schedule available.  2.on methadone by [facility], dispensed  x 5 days at a time per patient. Per patient, this is for chronic back pain, however information from that clinic is not presently available.  3.long and ongoing tobacco x 45 years, which she is interested in  stopping. Clear lungs by CXR 11-2015 4.broken and carious teeth: referral to [place] 5.history HTN. "borderline heart size" by CXR 11-2015 Needs PCP 6.allergic sinusitis: stop smoking, try OTC meds as noted. No evidence of bacterial sinusitis at present. Needs PCP 7.post surgery for cervical disc disease.  8.flu vaccine 04-01-16 9.GERD and constipation related to pain medication. Needs PCP 10.No mammograms or colonoscopy in this EMR.   Patient and daughter have had questions answered to their satisfaction and are in agreement with plan above. They can contact this office for questions or concerns at any time prior to next visit. Chemo orders entered, nonpathway diagnosis for [facility], managed care notified. Message to collaborative RN for antiemetic (to [place] if assistance there) and to collaborative RN + rad onc to coordinate chemo.   Time spent 50 min , including >50% discussion and coordination of care. CC Drs Valerie Kerr and Valerie Kerr.  Evlyn Clines, MD 04/01/2016 5:00 PM

## 2016-04-08 NOTE — Telephone Encounter (Signed)
lvm to inform pt of 1/15 appts per LL. Gave pt dtr appt dates/times

## 2016-04-08 NOTE — Progress Notes (Signed)
PRE-OPERATIVE NOTE:  04/08/2016 Valerie Kerr [id number]  VITALS: BP 118/73   Pulse 77   Temp 98.6 F (37 C) (Oral)   Resp 18   Ht 5\' 1"  (1.549 m)   Wt 158 lb (71.7 kg)   SpO2 96%   BMI 29.85 kg/m   Lab Results  Component Value Date   WBC 8.3 04/01/2016   HGB 13.6 04/01/2016   HCT 42.5 04/01/2016   MCV 86.2 04/01/2016   PLT 262 04/01/2016   BMET    Component Value Date/Time   NA 138 04/08/2016 0630   NA 141 04/01/2016 1203   K 3.5 04/08/2016 0630   K 3.6 04/01/2016 1203   CL 100 (L) 04/08/2016 0630   CO2 28 04/08/2016 0630   CO2 31 (H) 04/01/2016 1203   GLUCOSE 83 04/08/2016 0630   GLUCOSE 191 (H) 04/01/2016 1203   BUN 11 04/08/2016 0630   BUN 13.2 04/01/2016 1203   CREATININE 0.60 04/08/2016 0630   CREATININE 0.8 04/01/2016 1203   CALCIUM 9.2 04/08/2016 0630   CALCIUM 9.6 04/01/2016 1203   GFRNONAA >60 04/08/2016 0630   GFRAA >60 04/08/2016 0630    No results found for: INR, PROTIME No results found for: PTT   Valerie Kerr presents for Multiple dental extractions with alveoloplasty and gross debridement of remaining dentition in the operating room with general anesthesia.   SUBJECTIVE: The patient denies any acute medical or dental changes and agrees to proceed with treatment as planned.  EXAM: No sign of acute dental changes.  ASSESSMENT: Patient is affected by chronic apical periodontitis, retained root segments, dental caries, chronic periodontitis, and accretions.  PLAN: Patient agrees to proceed with treatment as planned in the operating room as previously discussed and accepts the risks, benefits, and complications of the proposed treatment. Patient is aware of the risk for bleeding, bruising, swelling, infection, pain, nerve damage, soft tissue damage, damage to adjacent teeth, sinus involvement, root tip fracture, mandible fracture, and the risks of complications associated with the anesthesia. Patient also is aware of the potential for  other complications not mentioned above.   Lenn Cal, DDS

## 2016-04-08 NOTE — Anesthesia Procedure Notes (Signed)
Procedure Name: Intubation Date/Time: 04/08/2016 7:34 AM Performed by: Manuela Schwartz B Pre-anesthesia Checklist: Patient identified, Emergency Drugs available, Suction available, Patient being monitored and Timeout performed Patient Re-evaluated:Patient Re-evaluated prior to inductionOxygen Delivery Method: Circle system utilized Preoxygenation: Pre-oxygenation with 100% oxygen Intubation Type: IV induction Ventilation: Mask ventilation without difficulty Laryngoscope Size: Mac and 3 Grade View: Grade I Tube type: Oral Tube size: 7.5 mm Number of attempts: 1 Airway Equipment and Method: Stylet Placement Confirmation: ETT inserted through vocal cords under direct vision,  positive ETCO2 and breath sounds checked- equal and bilateral Secured at: 21 cm Tube secured with: Tape Dental Injury: Teeth and Oropharynx as per pre-operative assessment

## 2016-04-08 NOTE — Discharge Instructions (Signed)

## 2016-04-08 NOTE — Op Note (Signed)
OPERATIVE REPORT  Patient:            Valerie Kerr Date of Birth:  02-27-1961 MRN:                [id number]   DATE OF PROCEDURE:  04/08/2016  PREOPERATIVE DIAGNOSES: 1. Cervical cancer 2. Prechemotherapy dental protocol 3. Chronic apical periodontitis 4. Retained root segments 5. Dental caries 6. Chronic periodontitis 7. Accretions  POSTOPERATIVE DIAGNOSES: 1. Cervical cancer 2. Prechemotherapy dental protocol 3. Chronic apical periodontitis 4. Retained root segments 5. Dental caries 6. Chronic periodontitis 7. Accretions  OPERATIONS: 1. Multiple extraction of tooth numbers 3, 9, 21, 30, and 31 2. 2 Quadrants of alveoloplasty 3. Gross debridement of remaining dentition   SURGEON: Lenn Cal, DDS  ASSISTANT: Camie Patience, (dental assistant)  ANESTHESIA: General anesthesia via  Oral endotracheal tube.  MEDICATIONS: 1. Ancef 2 g IV prior to invasive dental procedures. 2. Local anesthesia with a total utilization of 3 carpules each containing 34 mg of lidocaine with 0.017 mg of epinephrine as well as 2 carpules each containing 9 mg of bupivacaine with 0.009 mg of epinephrine.  SPECIMENS: There are 5 teeth that were discarded.  DRAINS: None  CULTURES: None  COMPLICATIONS: None   ESTIMATED BLOOD LOSS: 50 mLs.  INTRAVENOUS FLUIDS: 700 mLs of Lactated ringers solution.  INDICATIONS: The patient was recently diagnosed with cervical cancer.  A medically necessary dental consultation was then requested to evaluate poor dentition.  The patient was examined and treatment planned for multiple extractions with alveoloplasty and gross debridement of remaining dentition in the operating room with general anesthesia.  This treatment plan was formulated to decrease the risks and complications associated with dental infection from affecting the patient's systemic health while on active chemoradiation therapy.  OPERATIVE FINDINGS: Patient was examined operating  room number 2.  The teeth were identified for extraction. The patient was noted be affected by chronic apical periodontitis, multiple retained root segments, dental caries, chronic periodontitis, and accretions.   DESCRIPTION OF PROCEDURE: Patient was brought to the main operating room number 2. Patient was then placed in the supine position on the operating table. General anesthesia was then induced per the anesthesia team. The patient was then prepped and draped in the usual manner for a dental medicine procedure. A timeout was performed. The patient was identified and procedures were verified. A throat pack was placed at this time. The oral cavity was then thoroughly examined with the findings as noted above. The patient was then ready for dental medicine procedure as follows:  Local anesthesia was then administered sequentially with a total utilization of 3 carpules each containing 34 mg of lidocaine with 0.017 mg of epinephrine as well as 2 carpules  each containing 9 mg bupivacaine with 0.009 mg of epinephrine.  The Maxillary left and right quadrants first approached. Anesthesia was then delivered utilizing infiltration with lidocaine with epinephrine. A #15 blade incision was then made from the distal of #2 and extended to the mesial of #4. A  surgical flap was then carefully reflected.  The retained roots in the area of tooth number 3 was then removed with a rongeurs without complications.   Alveoloplasty was then performed utilizing a rongeurs and bone files to assist in obtaining primary closure. The surgical site was then irrigated with copious amounts sterile saline. Surgifoam was placed in the extraction sockets appropriately. The surgical site was then closed from the distal of #2 and extended to the distal of #4 utilizing  3-0 chromic gut suture in a continuous interrupted suture technique 1. An interproximal suture was placed between tooth numbers 4 and 5 with 3-0 chromic gut  material.  Tooth #9 was then approached. The tooth was subluxated with a series of straight elevators. This tooth was then removed with a 150 forceps without complications. The socket was curetted and compressed appropriately. The surgical site was irrigated with copious amounts of sterile saline. A piece of Surgifoam was placed in the extraction socket. The surgical site was closed with 3-0 chromic gut material in a figure-of-eight suture technique 1.  At this point time, the mandibular quadrants were approached. The patient was given bilateral inferior alveolar nerve blocks and long buccal nerve blocks utilizing the bupivacaine with epinephrine. Further infiltration was then achieved utilizing the lidocaine with epinephrine. A 15 blade incision was then made from the distal of number #31 and extended to the mesial of #29. A surgical flap was then carefully reflected. Appropriate amounts of buccal and interseptal bone was then removed with a surgical handpiece and bur and copious amounts sterile water. Tooth numbers 30 and 31 were then subluxated with a series straight elevators. The retained roots in the area of #30 were then extracted with a 151 forceps without complications.  Tooth #31 was then removed with a 23 forceps without complications. Sockets were curetted and compressed appropriately. Alveoloplasty was then performed with a rongeur and bone file to help achieve primary closure.  The surgical site was then irrigated with copious amounts of sterile saline.  The tissues were approximated and trimmed appropriately. A piece of Surgifoam was then placed in the extraction sockets as indicated. The surgical site was then closed from the distal of #31 and extended to the distal of #29 utilizing 3-0 chromic gut suture in a continuous interrupted suture technique 1. An interproximal suture was placed between tooth numbers 28 and 29 utilizing 3-0 chromic gut material to further close the surgical site. At  this point time tooth #21 was approached. A 15 blade incision was made from the mesial of #20 and extended to the mesial  of #22.  A surgical flap was then carefully reflected. Tooth #21 subluxated with a series straight elevators and removed with a rongeurs without complications. The socket was curetted and compressed appropriately. The surgical site was irrigated with sterile saline. The surgical site was then closed from the mesial of #21 and extended to the distal of #22 utilizing 3-0 chromic gut suture in a continuous to suture technique 1. An interproximal suture was then placed between tooth numbers 22 and 23 to further closed surgical site utilizing 3-0 chromic gut material.  At this point in time, the remaining dentition was approached. A sonic scaler was used to remove accretions. A series of hand curettes were then used to remove additional accretions. A sonic scaler was then again used to refine removal of accretions.   At this point time, the entire mouth was irrigated with copious amounts of sterile saline. The patient was examined for complications, seeing none, the dental medicine procedure was deemed to be complete. The throat pack was removed at this time. A series of 4 x 4 gauze were placed in the mouth to aid hemostasis. The patient was then handed over to the anesthesia team for final disposition. After an appropriate amount of time, the patient was extubated and taken to the postanesthesia care unit in good condition. All counts were correct for the dental medicine procedure.  She was instructed to  use ibuprofen 600 mg by mouth every 6 hours for the first 24 hours and then every 6 hours as needed for dental pain after the 24-hour period. Patient was also instructed to supplement the pain medication utilizing Tylenol 500 mg. Patient is to take 2 tablets every 6 hours as needed for pain. She was instructed not to exceed the 4 gram daily dose.   Lenn Cal, DDS.

## 2016-04-08 NOTE — Anesthesia Postprocedure Evaluation (Signed)
Anesthesia Post Note  Patient: Valerie Kerr  Procedure(s) Performed: Procedure(s) (LRB): Extraction of tooth #'s [phone] with alveoloplasty  and gross debridement of remaining teeth (N/A)  Patient location during evaluation: PACU Anesthesia Type: General Level of consciousness: awake and sedated Pain management: pain level controlled Vital Signs Assessment: post-procedure vital signs reviewed and stable Respiratory status: spontaneous breathing Cardiovascular status: stable Postop Assessment: no signs of nausea or vomiting and adequate PO intake Anesthetic complications: no        Last Vitals:  Vitals:   04/08/16 0608 04/08/16 0900  BP: 118/73 129/71  Pulse: 77 96  Resp: 18 16  Temp: 37 C 36.2 C    Last Pain:  Vitals:   04/08/16 0900  TempSrc:   PainSc: 0-No pain   Pain Goal: Patients Stated Pain Goal: 5 (04/08/16 0620)               Matsuko Kretz JR,JOHN Mateo Flow

## 2016-04-09 ENCOUNTER — Encounter (HOSPITAL_COMMUNITY): Payer: Self-pay | Admitting: Dentistry

## 2016-04-10 ENCOUNTER — Other Ambulatory Visit: Payer: Self-pay | Admitting: Oncology

## 2016-04-10 DIAGNOSIS — C539 Malignant neoplasm of cervix uteri, unspecified: Secondary | ICD-10-CM

## 2016-04-10 NOTE — Progress Notes (Signed)
  Radiation Oncology         [phone] ________________________________  Name: Valerie Kerr MRN: [id number]  Date: 04/05/2016  DOB: Mar 28, 1961  SIMULATION AND TREATMENT PLANNING NOTE    ICD-9-CM ICD-10-CM   1. 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  Identity was confirmed.  All relevant records and images related to the planned course of therapy were reviewed.  The patient freely provided informed written consent to proceed with treatment after reviewing the details related to the planned course of therapy. The consent form was witnessed and verified by the simulation staff.  Then, the patient was set-up in a stable reproducible  supine position for radiation therapy.  CT images were obtained.  Surface markings were placed.  The CT images were loaded into the planning software.  Then the target and avoidance structures were contoured.  Treatment planning then occurred.  The radiation prescription was entered and confirmed.  Then, I designed and supervised the construction of a total of 5 medically necessary complex treatment devices.  I have requested : 3D Simulation  I have requested a DVH of the following structures: cervix, uterus, small bowel, rectum, bladder.  I have ordered:dose calc.  PLAN:  The patient will receive 45 Gy in 25 fractions Followed by a boost to the parametrial area of 9 Gy in 5 fractions. The patient will then proceed with 5 intracavitary brachytherapy treatments using iridium 192 as the high-dose-rate source.    Special Treatment Procedure Note: The patient will be receiving radiosensitizing chemotherapy. Given the potential of increased toxicities related to combined therapy and the necessity for close monitoring of the patient and blood work, this constitutes a special treatment  procedure. -----------------------------------  [name], PhD, MD

## 2016-04-11 ENCOUNTER — Other Ambulatory Visit (HOSPITAL_BASED_OUTPATIENT_CLINIC_OR_DEPARTMENT_OTHER): Payer: Self-pay

## 2016-04-11 ENCOUNTER — Encounter: Payer: Self-pay | Admitting: Oncology

## 2016-04-11 ENCOUNTER — Institutional Professional Consult (permissible substitution): Payer: Self-pay | Admitting: Radiation Oncology

## 2016-04-11 ENCOUNTER — Ambulatory Visit (HOSPITAL_BASED_OUTPATIENT_CLINIC_OR_DEPARTMENT_OTHER): Payer: Self-pay | Admitting: Oncology

## 2016-04-11 VITALS — BP 144/79 | HR 80 | Temp 97.9°F | Resp 18 | Wt 154.1 lb

## 2016-04-11 DIAGNOSIS — C539 Malignant neoplasm of cervix uteri, unspecified: Secondary | ICD-10-CM

## 2016-04-11 DIAGNOSIS — Z720 Tobacco use: Secondary | ICD-10-CM

## 2016-04-11 DIAGNOSIS — F1120 Opioid dependence, uncomplicated: Secondary | ICD-10-CM

## 2016-04-11 DIAGNOSIS — F1190 Opioid use, unspecified, uncomplicated: Secondary | ICD-10-CM

## 2016-04-11 DIAGNOSIS — K089 Disorder of teeth and supporting structures, unspecified: Secondary | ICD-10-CM

## 2016-04-11 DIAGNOSIS — C531 Malignant neoplasm of exocervix: Secondary | ICD-10-CM

## 2016-04-11 LAB — CBC WITH DIFFERENTIAL/PLATELET
BASO%: 0.3 % (ref 0.0–2.0)
Basophils Absolute: 0 10*3/uL (ref 0.0–0.1)
EOS ABS: 0 10*3/uL (ref 0.0–0.5)
EOS%: 0.5 % (ref 0.0–7.0)
HEMATOCRIT: 43.1 % (ref 34.8–46.6)
HGB: 14.1 g/dL (ref 11.6–15.9)
LYMPH#: 2.7 10*3/uL (ref 0.9–3.3)
LYMPH%: 28.8 % (ref 14.0–49.7)
MCH: 27.7 pg (ref 25.1–34.0)
MCHC: 32.7 g/dL (ref 31.5–36.0)
MCV: 84.9 fL (ref 79.5–101.0)
MONO#: 0.5 10*3/uL (ref 0.1–0.9)
MONO%: 5.5 % (ref 0.0–14.0)
NEUT#: 6.1 10*3/uL (ref 1.5–6.5)
NEUT%: 64.9 % (ref 38.4–76.8)
PLATELETS: 291 10*3/uL (ref 145–400)
RBC: 5.07 10*6/uL (ref 3.70–5.45)
RDW: 13.4 % (ref 11.2–14.5)
WBC: 9.4 10*3/uL (ref 3.9–10.3)

## 2016-04-11 LAB — COMPREHENSIVE METABOLIC PANEL
ALT: 32 U/L (ref 0–55)
ANION GAP: 12 meq/L — AB (ref 3–11)
AST: 33 U/L (ref 5–34)
Albumin: 4 g/dL (ref 3.5–5.0)
Alkaline Phosphatase: 87 U/L (ref 40–150)
BILIRUBIN TOTAL: 0.54 mg/dL (ref 0.20–1.20)
BUN: 7 mg/dL (ref 7.0–26.0)
CALCIUM: 9.7 mg/dL (ref 8.4–10.4)
CHLORIDE: 102 meq/L (ref 98–109)
CO2: 29 mEq/L (ref 22–29)
CREATININE: 0.7 mg/dL (ref 0.6–1.1)
Glucose: 99 mg/dl (ref 70–140)
Potassium: 3.8 mEq/L (ref 3.5–5.1)
Sodium: 143 mEq/L (ref 136–145)
TOTAL PROTEIN: 8.4 g/dL — AB (ref 6.4–8.3)

## 2016-04-11 NOTE — Progress Notes (Addendum)
OFFICE PROGRESS NOTE   April 12, 2016   Physicians: Gwenith Spitz Constant, [name], Jorja Loa   INTERVAL HISTORY:   Patient is seen, together with daughter, in continuing attention to IIB poorly differentiated squamous cell carcinoma of cervix. Plan is pelvic radiation with weekly sensitizing CDDP, with parametrial boost plus intracavitary brachytherapy. First CDDP is planned Tues 04-12-16, then weekly chemo on Mondays. PET 04-04-16 with minimal uptake in sub-cm left axillary nodes which radiologist feels may be reactive, otherwise no evidence of distant disease. Cervical mass measures 5.5 x 5.1 cm with SUV max 21.8  Patient had extraction of 5 teeth with alveoplasty and debridement in OR by Dr Enrique Sack on 04-08-16. She tolerated the procedures well and denies any discomfort today; per Dr Enrique Sack, ok to proceed with chemo on 04-12-16 as planned.  Since dental extractions, patient is tolerating soft foods and liquids without difficulty, including ice cream, and feels she can do oral prehydration for CDDP. She has no bleeding from areas of dental work, no vaginal bleeding now. She denies fever, other pain. Bowels ok, bladder ok. No increased SOB or cough. She has decreased cigarettes to 1 daily. No LE swelling. She is relieved with apparent favorable PET results.  Remainder of 10 point Review of Systems negative   Flu vaccine 04-01-16 No central catheter  ONCOLOGIC HISTORY  Patient presented to ED 02-20-16 with postmenopausal bleeding which had been intermittent x 2 years. She was seen by gyn with cervical biopsy 03-01-16 showing poorly differentiated squamous cell carcinoma. There was difficulty reaching patient by phone with those results; she did have follow up with  Dr [name] on 03-16-16, but cancelled planned PET due to anxiety.  She saw Dr [name] on 03-23-16, with cervix replaced by 6 cm friable mass encroaching on upper vaginal fornices and right parametrial involvement. Staging  PET 04-04-16 shows no apparent distant disease, with some minimal uptake in sub-cm left axillary nodes which are thought likely reactive.   Objective:  Vital signs in last 24 hours:  BP (!) 144/79 (BP Location: Left Arm, Patient Position: Sitting)   Pulse 80   Temp 97.9 F (36.6 C) (Oral)   Resp 18   Wt 154 lb 1.6 oz (69.9 kg)   SpO2 97%   BMI 29.12 kg/m  Weight down 3 lbs (note dental extractions) Alert, oriented and appropriate, cooperative. Ambulatory without difficulty.   HEENT:PERRL, sclerae not icteric. Oral mucosa moist, no bleeding or areas of concern from recent dental procedures, posterior pharynx clear. Still some broken teeth Neck supple. No JVD.  Lymphatics:no cervical,supraclavicular, palpable left axillary or inguinal adenopathy Resp: somewhat diminished BS thruout otherwise clear to auscultation bilaterally and no dullness to percussion bilaterally Cardio: regular rate and rhythm. No gallop. GI: soft, nontender, not distended, no mass or organomegaly. Normally active bowel sounds.  Musculoskeletal/ Extremities: LE/UE without pitting edema, cords, tenderness Neuro: no peripheral neuropathy. Otherwise nonfocal. PSYCH less anxious today Skin without rash, ecchymosis, petechiae   Lab Results:  Results for orders placed or performed in visit on 04/11/16  CBC with Differential  Result Value Ref Range   WBC 9.4 3.9 - 10.3 10e3/uL   NEUT# 6.1 1.5 - 6.5 10e3/uL   HGB 14.1 11.6 - 15.9 g/dL   HCT 43.1 34.8 - 46.6 %   Platelets 291 145 - 400 10e3/uL   MCV 84.9 79.5 - 101.0 fL   MCH 27.7 25.1 - 34.0 pg   MCHC 32.7 31.5 - 36.0 g/dL   RBC 5.07 3.70 -  5.45 10e6/uL   RDW 13.4 11.2 - 14.5 %   lymph# 2.7 0.9 - 3.3 10e3/uL   MONO# 0.5 0.1 - 0.9 10e3/uL   Eosinophils Absolute 0.0 0.0 - 0.5 10e3/uL   Basophils Absolute 0.0 0.0 - 0.1 10e3/uL   NEUT% 64.9 38.4 - 76.8 %   LYMPH% 28.8 14.0 - 49.7 %   MONO% 5.5 0.0 - 14.0 %   EOS% 0.5 0.0 - 7.0 %   BASO% 0.3 0.0 - 2.0 %   Comprehensive metabolic panel  Result Value Ref Range   Sodium 143 136 - 145 mEq/L   Potassium 3.8 3.5 - 5.1 mEq/L   Chloride 102 98 - 109 mEq/L   CO2 29 22 - 29 mEq/L   Glucose 99 70 - 140 mg/dl   BUN 7.0 7.0 - 26.0 mg/dL   Creatinine 0.7 0.6 - 1.1 mg/dL   Total Bilirubin 0.54 0.20 - 1.20 mg/dL   Alkaline Phosphatase 87 40 - 150 U/L   AST 33 5 - 34 U/L   ALT 32 0 - 55 U/L   Total Protein 8.4 (H) 6.4 - 8.3 g/dL   Albumin 4.0 3.5 - 5.0 g/dL   Calcium 9.7 8.4 - 10.4 mg/dL   Anion Gap 12 (H) 3 - 11 mEq/L   EGFR >90 >90 ml/min/1.73 m2    Labs reviewed as above Studies/Results:  EXAM: NUCLEAR MEDICINE PET SKULL BASE TO THIGH  04-04-16  COMPARISON:  None.  FINDINGS: NECK  No hypermetabolic lymph nodes in the neck.  CHEST  No hypermetabolic mediastinal or hilar nodes. A few sub-cm left axillary lymph nodes are seen showing hypermetabolic activity, with highest SUV max of 6.7. No pathologically enlarged lymph nodes identified. No hypermetabolic lesions seen in either breast. No suspicious pulmonary nodules on the CT scan.  ABDOMEN/PELVIS  No abnormal hypermetabolic activity within the liver, pancreas, adrenal glands, or spleen.  A hypermetabolic soft tissue mass is seen in the region of the cervix, measuring 5.5 x 5.1 cm on image 160/4. This mass shows SUV max of 21.8.  No hypermetabolic lymph nodes in the abdomen or pelvis. Aortic atherosclerosis.  SKELETON  No focal hypermetabolic activity to suggest skeletal metastasis.  IMPRESSION: 5.5 cm hypermetabolic cervical mass, consistent with known primary cervical carcinoma. No definite local or distant metastatic disease.  Sub-cm hypermetabolic left axillary lymph nodes, likely reactive in etiology. Recommend continued attention on follow-up imaging.    Medications: I have reviewed the patient's current medications. Using biotene as instructed. 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   DISCUSSION Reviewed PET  Reviewed oral prehydration for CDDP; RN also reviewed orall prehydration with patient and daughter. Encouraged smoking cessation She appears fine from standpoint of recent dental extractions to proceed with sensitizing CDDP on 04-12-16; Drs Sandi Raveling and I have been in communication about this.   Patient is in agreement with treatment plans., verbal consent for chemo. Scheduling med onc and rad onc reviewed, written schedules for these given.    Assessment/Plan: 1.Poorly differentiated squamous cell carcinoma of cervix: IIB by gyn oncology exam, PET 04-04-16 without clear distant disease. Plan sensitizing CDDP with radiation starting 04-12-16. No heavy vaginal bleeding now, and no other urgent symptoms from the cervical cancer. I will see her coordinating with radiation and chemo appointments thru end of Jan, then Dr Alvy Bimler. 2.on methadone by [facility], dispensed  x 5 days at a time per patient. Per patient, this is for chronic back pain, however information from that clinic is not presently available. Compazine chosen as home antiemetic 3.long and ongoing tobacco x 45 years, which she is interested in stopping, down to 1 cigarette daily since the dental extractions. Clear lungs by CXR 11-2015. Plan as above 4.post multiple dental extractions 04-08-16. Per Dr Enrique Sack, ok for chemo as planned 04-12-16 5.history HTN. "borderline heart size" by CXR 11-2015 Needs PCP 6.allergic sinusitis: improved 7.post surgery for cervical disc disease.  8.flu vaccine 04-01-16 9.GERD and constipation related to pain medication. Needs PCP 10.No mammograms or colonoscopy in this EMR. 11. Flu vaccine 04-01-16   All questions answered and they know to call if concerns between scheduled appointments. Chemo orders confirmed. Time spent 25 min including >50% counseling and  coordination of care. Cc Dr Sondra Come, Dr Enrique Sack  ADDENDUM:  Attempted referral to [facility] and [facility] [phone] to set up PCP, however they are not accepting new patient referrals before Apr 28, 2016.  This office will need to call on or shortly after 04-28-16 to make new patient referral. Note if patient misses appointment, they will not reschedule.   Evlyn Clines, MD   04/12/2016, 2:27 PM

## 2016-04-12 ENCOUNTER — Encounter: Payer: Self-pay | Admitting: Radiation Oncology

## 2016-04-12 ENCOUNTER — Ambulatory Visit
Admission: RE | Admit: 2016-04-12 | Discharge: 2016-04-12 | Disposition: A | Discharge status: Home / Self Care | Payer: Self-pay | Source: Ambulatory Visit | Attending: Radiation Oncology | Admitting: Radiation Oncology

## 2016-04-12 ENCOUNTER — Other Ambulatory Visit: Payer: Self-pay

## 2016-04-12 ENCOUNTER — Ambulatory Visit
Admission: RE | Admit: 2016-04-12 | Discharge: 2016-04-12 | Disposition: A | Discharge status: Home / Self Care | Payer: Medicaid Other | Source: Ambulatory Visit | Attending: Radiation Oncology | Admitting: Radiation Oncology

## 2016-04-12 ENCOUNTER — Ambulatory Visit (HOSPITAL_BASED_OUTPATIENT_CLINIC_OR_DEPARTMENT_OTHER): Payer: Self-pay

## 2016-04-12 VITALS — BP 123/62 | HR 82 | Temp 98.3°F | Ht 61.0 in | Wt 156.4 lb

## 2016-04-12 VITALS — BP 138/71 | HR 82 | Temp 98.9°F | Resp 18

## 2016-04-12 DIAGNOSIS — C539 Malignant neoplasm of cervix uteri, unspecified: Secondary | ICD-10-CM

## 2016-04-12 DIAGNOSIS — Z5111 Encounter for antineoplastic chemotherapy: Secondary | ICD-10-CM

## 2016-04-12 MED ORDER — SODIUM CHLORIDE 0.9 % IV SOLN
Freq: Once | INTRAVENOUS | Status: AC
  Administered 2016-04-12: 12:00:00 via INTRAVENOUS

## 2016-04-12 MED ORDER — POTASSIUM CHLORIDE 2 MEQ/ML IV SOLN
Freq: Once | INTRAVENOUS | Status: AC
  Administered 2016-04-12: 13:00:00 via INTRAVENOUS
  Filled 2016-04-12: qty 10

## 2016-04-12 MED ORDER — ONDANSETRON HCL 8 MG PO TABS
ORAL_TABLET | ORAL | Status: AC
  Filled 2016-04-12: qty 1

## 2016-04-12 MED ORDER — SODIUM CHLORIDE 0.9 % IV SOLN: Freq: Once | INTRAVENOUS | Status: DC

## 2016-04-12 MED ORDER — ONDANSETRON HCL 4 MG/2ML IJ SOLN
8.0000 mg | Freq: Once | INTRAMUSCULAR | Status: AC
  Administered 2016-04-12: 8 mg via INTRAVENOUS

## 2016-04-12 MED ORDER — ONDANSETRON HCL 4 MG/2ML IJ SOLN
INTRAMUSCULAR | Status: AC
  Filled 2016-04-12: qty 4

## 2016-04-12 MED ORDER — SODIUM CHLORIDE 0.9 % IV SOLN
Freq: Once | INTRAVENOUS | Status: AC
  Administered 2016-04-12: 15:00:00 via INTRAVENOUS
  Filled 2016-04-12: qty 5

## 2016-04-12 MED ORDER — LORAZEPAM 1 MG PO TABS
ORAL_TABLET | ORAL | Status: AC
  Filled 2016-04-12: qty 1

## 2016-04-12 MED ORDER — CISPLATIN CHEMO INJECTION 100MG/100ML
40.0000 mg/m2 | Freq: Once | INTRAVENOUS | Status: AC
  Administered 2016-04-12: 70 mg via INTRAVENOUS
  Filled 2016-04-12: qty 70

## 2016-04-12 MED ORDER — LORAZEPAM 1 MG PO TABS
1.0000 mg | ORAL_TABLET | Freq: Once | ORAL | Status: AC | PRN
  Administered 2016-04-12: 1 mg via ORAL

## 2016-04-12 NOTE — Progress Notes (Signed)
Aralyn Yott has completed 1 fraction to her pelvis.  She reports having pain in her neck radiating down her arm that she has had for years.  She denies having any bladder or bowel issues.  She reports having some vaginal spotting and said it has decreased in amount. She mostly notices it after a bowel movement.  She will have chemo today.  BP 123/62 (BP Location: Right Arm, Patient Position: Sitting)   Pulse 82   Temp 98.3 F (36.8 C) (Oral)   Ht 5\' 1"  (1.549 m)   Wt 156 lb 6.4 oz (70.9 kg)   SpO2 100%   BMI 29.55 kg/m    Wt Readings from Last 3 Encounters:  04/12/16 156 lb 6.4 oz (70.9 kg)  04/11/16 154 lb 1.6 oz (69.9 kg)  04/08/16 158 lb (71.7 kg)

## 2016-04-12 NOTE — Progress Notes (Signed)
  Radiation Oncology         [phone] ________________________________  Name: Valerie Kerr MRN: [id number]  Date: 04/12/2016  DOB: Apr 15, 1960  Weekly Radiation Therapy Management    ICD-9-CM ICD-10-CM   1. Malignant neoplasm of cervix, unspecified site ([place]) 180.9 C53.9      Current Dose: 1.8 Gy     Planned Dose:  54+ Gy  Narrative . . . . . . . . The patient presents for routine under treatment assessment.                                    Valerie Kerr has completed 1 fraction to her pelvis.  She reports having pain in her neck radiating down her right arm that she has had for years. She denies having any bladder or bowel issues.  She reports having some vaginal spotting and said it has decreased in amount. She mostly notices it after a bowel movement.  She will have chemo today.                                  Set-up films were reviewed.                                 The chart was checked. Physical Findings. . .  height is 5\' 1"  (1.549 m) and weight is 156 lb 6.4 oz (70.9 kg). Her oral temperature is 98.3 F (36.8 C). Her blood pressure is 123/62 and her pulse is 82. Her oxygen saturation is 100%. . Weight essentially stable.  Lungs are clear to auscultation bilaterally. Heart has regular rate and rhythm. Abdomen soft, non-tender, normal bowel sounds. Impression . . . . . . . The patient is tolerating radiation. Plan . . . . . . . . . . . . Continue treatment as planned. ________________________________   [name], PhD, MD  This document serves as a record of services personally performed by [name], MD. It was created on his behalf by [name], a trained medical scribe. The creation of this record is based on the scribe's personal observations and the provider's statements to them. This document has been checked and approved by the attending provider.

## 2016-04-12 NOTE — Patient Instructions (Addendum)
Valerie Kerr Discharge Instructions for Patients Receiving Chemotherapy  Today you received the following chemotherapy agents: Cisplatin To help prevent nausea and vomiting after your treatment, we encourage you to take your nausea medication as prescribed. If you develop nausea and vomiting that is not controlled by your nausea medication, call the clinic.   BELOW ARE SYMPTOMS THAT SHOULD BE REPORTED IMMEDIATELY:  *FEVER GREATER THAN 100.5 F  *CHILLS WITH OR WITHOUT FEVER  NAUSEA AND VOMITING THAT IS NOT CONTROLLED WITH YOUR NAUSEA MEDICATION  *UNUSUAL SHORTNESS OF BREATH  *UNUSUAL BRUISING OR BLEEDING  TENDERNESS IN MOUTH AND THROAT WITH OR WITHOUT PRESENCE OF ULCERS  *URINARY PROBLEMS  *BOWEL PROBLEMS  UNUSUAL RASH Items with * indicate a potential emergency and should be followed up as soon as possible.  Feel free to call the clinic you have any questions or concerns. The clinic phone number is [phone].  Please show the [place] at check-in to the Emergency Department and triage nurse.  Cisplatin injection What is this medicine? CISPLATIN (SIS pla tin) is a chemotherapy drug. It targets fast dividing cells, like cancer cells, and causes these cells to die. This medicine is used to treat many types of cancer like bladder, ovarian, and testicular cancers. This medicine may be used for other purposes; ask your health care provider or pharmacist if you have questions. COMMON BRAND NAME(S): Platinol, Platinol -AQ What should I tell my health care provider before I take this medicine? They need to know if you have any of these conditions: -blood disorders -hearing problems -kidney disease -recent or ongoing radiation therapy -an unusual or allergic reaction to cisplatin, carboplatin, other chemotherapy, other medicines, foods, dyes, or preservatives -pregnant or trying to get pregnant -breast-feeding How should I use this medicine? This drug is  given as an infusion into a vein. It is administered in a hospital or clinic by a specially trained health care professional. Talk to your pediatrician regarding the use of this medicine in children. Special care may be needed. Overdosage: If you think you have taken too much of this medicine contact a poison control center or emergency room at once. NOTE: This medicine is only for you. Do not share this medicine with others. What if I miss a dose? It is important not to miss a dose. Call your doctor or health care professional if you are unable to keep an appointment. What may interact with this medicine? -dofetilide -foscarnet -medicines for seizures -medicines to increase blood counts like filgrastim, pegfilgrastim, sargramostim -probenecid -pyridoxine used with altretamine -rituximab -some antibiotics like amikacin, gentamicin, neomycin, polymyxin B, streptomycin, tobramycin -sulfinpyrazone -vaccines -zalcitabine Talk to your doctor or health care professional before taking any of these medicines: -acetaminophen -aspirin -ibuprofen -ketoprofen -naproxen This list may not describe all possible interactions. Give your health care provider a list of all the medicines, herbs, non-prescription drugs, or dietary supplements you use. Also tell them if you smoke, drink alcohol, or use illegal drugs. Some items may interact with your medicine. What should I watch for while using this medicine? Your condition will be monitored carefully while you are receiving this medicine. You will need important blood work done while you are taking this medicine. This drug may make you feel generally unwell. This is not uncommon, as chemotherapy can affect healthy cells as well as cancer cells. Report any side effects. Continue your course of treatment even though you feel ill unless your doctor tells you to stop. In some cases, you  may be given additional medicines to help with side effects. Follow all  directions for their use. Call your doctor or health care professional for advice if you get a fever, chills or sore throat, or other symptoms of a cold or flu. Do not treat yourself. This drug decreases your body's ability to fight infections. Try to avoid being around people who are sick. This medicine may increase your risk to bruise or bleed. Call your doctor or health care professional if you notice any unusual bleeding. Be careful brushing and flossing your teeth or using a toothpick because you may get an infection or bleed more easily. If you have any dental work done, tell your dentist you are receiving this medicine. Avoid taking products that contain aspirin, acetaminophen, ibuprofen, naproxen, or ketoprofen unless instructed by your doctor. These medicines may hide a fever. Do not become pregnant while taking this medicine. Women should inform their doctor if they wish to become pregnant or think they might be pregnant. There is a potential for serious side effects to an unborn child. Talk to your health care professional or pharmacist for more information. Do not breast-feed an infant while taking this medicine. Drink fluids as directed while you are taking this medicine. This will help protect your kidneys. Call your doctor or health care professional if you get diarrhea. Do not treat yourself. What side effects may I notice from receiving this medicine? Side effects that you should report to your doctor or health care professional as soon as possible: -allergic reactions like skin rash, itching or hives, swelling of the face, lips, or tongue -signs of infection - fever or chills, cough, sore throat, pain or difficulty passing urine -signs of decreased platelets or bleeding - bruising, pinpoint red spots on the skin, black, tarry stools, nosebleeds -signs of decreased red blood cells - unusually weak or tired, fainting spells, lightheadedness -breathing problems -changes in  hearing -gout pain -low blood counts - This drug may decrease the number of white blood cells, red blood cells and platelets. You may be at increased risk for infections and bleeding. -nausea and vomiting -pain, swelling, redness or irritation at the injection site -pain, tingling, numbness in the hands or feet -problems with balance, movement -trouble passing urine or change in the amount of urine Side effects that usually do not require medical attention (report to your doctor or health care professional if they continue or are bothersome): -changes in vision -loss of appetite -metallic taste in the mouth or changes in taste This list may not describe all possible side effects. Call your doctor for medical advice about side effects. You may report side effects to FDA at 1-800-FDA-1088. Where should I keep my medicine? This drug is given in a hospital or clinic and will not be stored at home. NOTE: This sheet is a summary. It may not cover all possible information. If you have questions about this medicine, talk to your doctor, pharmacist, or health care provider.  2017 Elsevier/Gold Standard (2007-06-19 14:40:54)

## 2016-04-12 NOTE — Progress Notes (Signed)
Pt here for patient teaching.  Pt given Radiation and You booklet. Pt reports they have not watched the Radiation Therapy Education video and has been given the link to watch at home.  Reviewed areas of pertinence such as diarrhea, fatigue, nausea and vomiting, skin changes and urinary and bladder changes . Pt able to give teach back of to pat skin, use unscented/gentle soap, use baby wipes, have Imodium on hand, drink plenty of water and sitz bath,avoid applying anything to skin within 4 hours of treatment. Pt demonstrated understanding and verbalizes understanding of information given and will contact nursing with any questions or concerns.

## 2016-04-12 NOTE — Progress Notes (Signed)
  Radiation Oncology         [phone]) [phone] ________________________________  Name: Valerie Kerr MRN: [id number]  Date: 04/12/2016  DOB: 1960/06/15  Simulation Verification Note    ICD-9-CM ICD-10-CM   1. Malignant neoplasm of cervix, unspecified site ([place]) 180.9 C53.9     Status: outpatient  NARRATIVE: The patient was brought to the treatment unit and placed in the planned treatment position. The clinical setup was verified. Then port films were obtained and uploaded to the radiation oncology medical record software.  The treatment beams were carefully compared against the planned radiation fields. The position location and shape of the radiation fields was reviewed. They targeted volume of tissue appears to be appropriately covered by the radiation beams. Organs at risk appear to be excluded as planned.  Based on my personal review, I approved the simulation verification. The patient's treatment will proceed as planned.  -----------------------------------  [name], PhD, MD

## 2016-04-13 ENCOUNTER — Ambulatory Visit: Payer: Medicaid Other

## 2016-04-13 ENCOUNTER — Telehealth: Payer: Self-pay

## 2016-04-13 NOTE — Telephone Encounter (Signed)
Valerie Kerr is doing well after her chemotherapy yesterday. She is drinking fluids well.  Will take in at least 60 oz.of water. No N/V. Bowels moved well yesterday.  She will take a Ducolax tab tonight. She intends on coming in for radiation tomorrow and Dr. Mariana Kaufman visit.

## 2016-04-14 ENCOUNTER — Encounter: Payer: Self-pay | Admitting: *Deleted

## 2016-04-14 ENCOUNTER — Ambulatory Visit (HOSPITAL_BASED_OUTPATIENT_CLINIC_OR_DEPARTMENT_OTHER): Payer: Self-pay | Admitting: Oncology

## 2016-04-14 ENCOUNTER — Other Ambulatory Visit (HOSPITAL_BASED_OUTPATIENT_CLINIC_OR_DEPARTMENT_OTHER): Payer: Self-pay

## 2016-04-14 ENCOUNTER — Ambulatory Visit
Admission: RE | Admit: 2016-04-14 | Discharge: 2016-04-14 | Disposition: A | Discharge status: Home / Self Care | Payer: Medicaid Other | Source: Ambulatory Visit | Attending: Radiation Oncology | Admitting: Radiation Oncology

## 2016-04-14 VITALS — BP 142/83 | HR 64 | Temp 97.9°F | Resp 18 | Ht 61.0 in | Wt 159.0 lb

## 2016-04-14 DIAGNOSIS — C539 Malignant neoplasm of cervix uteri, unspecified: Secondary | ICD-10-CM

## 2016-04-14 LAB — CBC WITH DIFFERENTIAL/PLATELET
BASO%: 0.2 % (ref 0.0–2.0)
Basophils Absolute: 0 10*3/uL (ref 0.0–0.1)
EOS%: 0.1 % (ref 0.0–7.0)
Eosinophils Absolute: 0 10*3/uL (ref 0.0–0.5)
HCT: 38.9 % (ref 34.8–46.6)
HGB: 12.6 g/dL (ref 11.6–15.9)
LYMPH#: 3.3 10*3/uL (ref 0.9–3.3)
LYMPH%: 29.9 % (ref 14.0–49.7)
MCH: 27.6 pg (ref 25.1–34.0)
MCHC: 32.5 g/dL (ref 31.5–36.0)
MCV: 85.2 fL (ref 79.5–101.0)
MONO#: 0.7 10*3/uL (ref 0.1–0.9)
MONO%: 6.2 % (ref 0.0–14.0)
NEUT#: 7.1 10*3/uL — ABNORMAL HIGH (ref 1.5–6.5)
NEUT%: 63.6 % (ref 38.4–76.8)
Platelets: 283 10*3/uL (ref 145–400)
RBC: 4.57 10*6/uL (ref 3.70–5.45)
RDW: 13.3 % (ref 11.2–14.5)
WBC: 11.1 10*3/uL — ABNORMAL HIGH (ref 3.9–10.3)

## 2016-04-14 LAB — COMPREHENSIVE METABOLIC PANEL
ALT: 28 U/L (ref 0–55)
AST: 26 U/L (ref 5–34)
Albumin: 3.9 g/dL (ref 3.5–5.0)
Alkaline Phosphatase: 83 U/L (ref 40–150)
Anion Gap: 8 mEq/L (ref 3–11)
BUN: 15 mg/dL (ref 7.0–26.0)
CHLORIDE: 98 meq/L (ref 98–109)
CO2: 31 meq/L — AB (ref 22–29)
CREATININE: 0.8 mg/dL (ref 0.6–1.1)
Calcium: 9.4 mg/dL (ref 8.4–10.4)
EGFR: >90 mL/min/{1.73_m2} (ref >90)
Glucose: 88 mg/dl (ref 70–140)
Potassium: 3.9 mEq/L (ref 3.5–5.1)
Sodium: 138 mEq/L (ref 136–145)
Total Bilirubin: 0.42 mg/dL (ref 0.20–1.20)
Total Protein: 7.6 g/dL (ref 6.4–8.3)

## 2016-04-14 LAB — MAGNESIUM: Magnesium: 2 mg/dl (ref 1.5–2.5)

## 2016-04-14 MED ORDER — BIAFINE EX EMUL
Freq: Once | CUTANEOUS | Status: AC
  Administered 2016-04-14: 10:00:00 via TOPICAL

## 2016-04-14 NOTE — Progress Notes (Signed)
[facility] Psychosocial Distress Screening Clinical Social Work  Clinical Social Work was referred by distress screening protocol.  The patient scored a 10 on the Psychosocial Distress Thermometer which indicates severe distress. Clinical Social Worker met with patient and daughter in exam room to assess for distress and other psychosocial needs. Ms. Starzyk shared her distress is heightened due to financial concerns.  She was accompanied by her daughter- stated they shared a car payment and are behind on the payment.  CSW explained she is unfamilar with resources to assist with car payment.  CSW provided contact number for [employer] and encouraged patient to call for financial assistance.  Ms. Page plans to follow up with CSW regarding [employer] application.  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  Patient is interested in applying for [place]- CSW explained steps necessary to apply.  Ms. Ranganathan stated she has applied before and is familiar with the process.  ONCBCN DISTRESS SCREENING 04/14/2016  Screening Type Initial Screening  Distress experienced in past week (1-10) 10  Practical problem type Housing;Insurance;Transportation;Food  Family Problem type Other (comment)  Emotional problem type Nervousness/Anxiety;Adjusting to illness  Physical Problem type Sleep/insomnia  Referral to clinical social work Yes   Polo Riley, MSW, LCSW, OSW-C Clinical Social Worker [facility] [phone]

## 2016-04-14 NOTE — Progress Notes (Signed)
Patient provided with biafine cream per her request to be prepared in case she has skin irritation.  She was instructed to apply a thin layer to her lower abdomen/groin area twice a day, after radiation and bedtime if she notices that her skin is dry, itchy or red.  Patient verbalized understanding and agreement.

## 2016-04-14 NOTE — Progress Notes (Signed)
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 Plan is for weekly CDDP during pelvic radiation, which is scheduled thru ~ 05-24-16.  Dr Enrique Sack will see her 04-18-16, for follow up of recent dental extractions.  Patient tolerated first CDDP 04-12-16 without difficulty, including full oral prehydration. She has not had significant nausea and no vomiting. Bowels are moving. She reports no problems with peripheral IV access. She is tolerating radiation, denies vaginal bleeding now. No fever, no LE swelling, bladder ok. No new or different pain. No increased SOB, trying to minimize smoking. She is aware of sutures at sites of recent dental extractions, continues mouthwash as as instructed, no pain and is tolerating diet.  Remainder of 14 point Review of Systems negative.    Flu vaccine 04-01-16 No central catheter  ONCOLOGIC HISTORY Patient presented to ED 02-20-16 with postmenopausal bleeding which had been intermittent x 2 years. She was seen by gyn with cervical biopsy 03-01-16 showing poorly differentiated squamous cell carcinoma. There was difficulty reaching patient by phone with those results; she did have follow up with Dr [name] on 03-16-16, but cancelled planned PET due to anxiety. She saw Dr [name] on 03-23-16, with cervix replaced by 6 cm friable mass encroaching on upper vaginal fornices and right parametrial involvement. Staging PET 04-04-16 shows no apparent distant disease, with some minimal uptake in sub-cm left axillary nodes which are thought likely reactive.  Objective:  Vital signs in last 24 hours:  BP (!) 142/83 (BP Location: Left Arm, Patient Position: Sitting) Comment: made nurse aware of eleavated BP  Pulse 64    Temp 97.9 F (36.6 C) (Oral)   Resp 18   Ht 5' 1"  (1.549 m)   Wt 159 lb (72.1 kg)   SpO2 96%   BMI 30.04 kg/m  Weight up 5 lbs compared with just after dental extractions Alert, oriented and appropriate. Ambulatory easily.  No alopecia  HEENT:PERRL, sclerae not icteric. Several sutures working out from areas of extractions, which otherwise appear to be healing well. Oral mucosa moist, posterior pharynx clear.  Neck supple. No JVD.  Lymphatics:no supraclavicular adenopathy Resp: clear to auscultation bilaterally and normal percussion bilaterally Cardio: regular rate and rhythm. No gallop. GI: soft, nontender, not distended, no mass or organomegaly. Normally active bowel sounds. Musculoskeletal/ Extremities: LE without pitting edema, cords, tenderness Neuro: nonfocal  PSYCH appropriate mood and affect Skin without rash, ecchymosis, petechiae   Lab Results:  Results for orders placed or performed in visit on 04/14/16  CBC with Differential  Result Value Ref Range   WBC 11.1 (H) 3.9 - 10.3 10e3/uL   NEUT# 7.1 (H) 1.5 - 6.5 10e3/uL   HGB 12.6 11.6 - 15.9 g/dL   HCT 38.9 34.8 - 46.6 %   Platelets 283 145 - 400 10e3/uL   MCV 85.2 79.5 - 101.0 fL   MCH 27.6 25.1 - 34.0 pg   MCHC 32.5 31.5 - 36.0 g/dL   RBC 4.57 3.70 - 5.45 10e6/uL   RDW 13.3 11.2 - 14.5 %   lymph# 3.3 0.9 - 3.3 10e3/uL   MONO# 0.7 0.1 - 0.9 10e3/uL   Eosinophils Absolute 0.0 0.0 - 0.5 10e3/uL   Basophils Absolute 0.0 0.0 - 0.1 10e3/uL   NEUT% 63.6 38.4 - 76.8 %  LYMPH% 29.9 14.0 - 49.7 %   MONO% 6.2 0.0 - 14.0 %   EOS% 0.1 0.0 - 7.0 %   BASO% 0.2 0.0 - 2.0 %  Comprehensive metabolic panel  Result Value Ref Range   Sodium 138 136 - 145 mEq/L   Potassium 3.9 3.5 - 5.1 mEq/L   Chloride 98 98 - 109 mEq/L   CO2 31 (H) 22 - 29 mEq/L   Glucose 88 70 - 140 mg/dl   BUN 15.0 7.0 - 26.0 mg/dL   Creatinine 0.8 0.6 - 1.1 mg/dL   Total Bilirubin 0.42 0.20 - 1.20 mg/dL   Alkaline Phosphatase 83 40 - 150 U/L   AST  26 5 - 34 U/L   ALT 28 0 - 55 U/L   Total Protein 7.6 6.4 - 8.3 g/dL   Albumin 3.9 3.5 - 5.0 g/dL   Calcium 9.4 8.4 - 10.4 mg/dL   Anion Gap 8 3 - 11 mEq/L   EGFR >90 >90 ml/min/1.73 m2  Magnesium  Result Value Ref Range   Magnesium 2.0 1.5 - 2.5 mg/dl   Labs reviewed at visit.  Studies/Results:  No results found.  Medications: I have reviewed the patient's current medications. Methadone is managed by [facility].   DISCUSSION Patient is pleased with how she has tolerated treatment so far and in agreement with continuing as planned. She understands that she needs to do oral prehydration prior to each chemotherapy treatment.  She will transfer medical oncology care to Dr Alvy Bimler in Feb.   Assessment/Plan:  1.Poorly differentiated squamous cell carcinoma of cervix: IIB by gyn oncology exam, PET 04-04-16 without clear distant disease. Weekly sensitizing CDDP begun 04-12-16 with pelvic radiation;  intracavitary brachytherapy also planned. No heavy vaginal bleeding now. She will have #2 CDDP on 04-18-16. 2.on methadone by [facility]/ Per patient, this is for chronic back pain, however information from that clinic is not in this EMR 3.long and ongoing tobacco x 45 years, which she is interested in stopping, down to 1 cigarette daily since the dental extractions. CXR 11-2015 no findings of concern. Encouraged smoking cessation 4.post multiple dental extractions 04-08-16, follow up with Dr Enrique Sack next week.  5.history HTN. "borderline heart size" by CXR 11-2015 Needs PCP 6. Information for advance directives given previously 7.post surgery for cervical disc disease.  8.flu vaccine 04-01-16 9.GERD and constipation related to pain medication. Needs PCP 10.No mammograms or colonoscopy, hopefully can be addressed after present treatments completed 11. Flu vaccine 04-01-16  All questions answered and patient knows to call if concerns prior to scheduled appointments. Chemo orders  confirmed. Time spent 25 min including >50% counseling and coordination of care. Cc Dr Sondra Come, Dr Enrique Sack    Evlyn Clines, MD   04/14/2016, 2:56 PM

## 2016-04-15 ENCOUNTER — Ambulatory Visit
Admission: RE | Admit: 2016-04-15 | Discharge: 2016-04-15 | Disposition: A | Discharge status: Home / Self Care | Payer: Medicaid Other | Source: Ambulatory Visit | Attending: Radiation Oncology | Admitting: Radiation Oncology

## 2016-04-15 ENCOUNTER — Other Ambulatory Visit: Payer: Self-pay

## 2016-04-15 ENCOUNTER — Encounter: Payer: Self-pay | Admitting: Oncology

## 2016-04-15 DIAGNOSIS — C539 Malignant neoplasm of cervix uteri, unspecified: Secondary | ICD-10-CM

## 2016-04-15 MED ORDER — ONDANSETRON HCL 8 MG PO TABS: 8.0000 mg | ORAL_TABLET | Freq: Three times a day (TID) | ORAL | 0 refills | Status: DC | PRN

## 2016-04-15 MED ORDER — OMEPRAZOLE 20 MG PO CPDR: 20.0000 mg | DELAYED_RELEASE_CAPSULE | Freq: Every day | ORAL | 0 refills | Status: DC

## 2016-04-15 MED FILL — OMEPRAZOLE 20 MG CAPSULE DR: 20 | 30 days supply | Qty: 30 | Fill #0

## 2016-04-15 MED FILL — ONDANSETRON HCL 8 MG TABLET: 8 | 7 days supply | Qty: 20 | Fill #0

## 2016-04-17 ENCOUNTER — Telehealth (HOSPITAL_COMMUNITY): Payer: Self-pay | Admitting: Emergency Medicine

## 2016-04-18 ENCOUNTER — Encounter: Payer: Self-pay | Admitting: Oncology

## 2016-04-18 ENCOUNTER — Ambulatory Visit (HOSPITAL_COMMUNITY): Payer: Self-pay | Admitting: Dentistry

## 2016-04-18 ENCOUNTER — Encounter (HOSPITAL_COMMUNITY): Payer: Self-pay | Admitting: Dentistry

## 2016-04-18 ENCOUNTER — Ambulatory Visit (HOSPITAL_BASED_OUTPATIENT_CLINIC_OR_DEPARTMENT_OTHER): Payer: Self-pay

## 2016-04-18 ENCOUNTER — Other Ambulatory Visit (HOSPITAL_BASED_OUTPATIENT_CLINIC_OR_DEPARTMENT_OTHER): Payer: Self-pay

## 2016-04-18 ENCOUNTER — Ambulatory Visit
Admission: RE | Admit: 2016-04-18 | Discharge: 2016-04-18 | Disposition: A | Discharge status: Home / Self Care | Payer: Medicaid Other | Source: Ambulatory Visit | Attending: Radiation Oncology | Admitting: Radiation Oncology

## 2016-04-18 ENCOUNTER — Other Ambulatory Visit: Payer: Self-pay | Admitting: Oncology

## 2016-04-18 VITALS — BP 133/79 | HR 92 | Temp 98.7°F | Resp 18

## 2016-04-18 VITALS — BP 119/62 | HR 87 | Temp 98.4°F

## 2016-04-18 DIAGNOSIS — C539 Malignant neoplasm of cervix uteri, unspecified: Secondary | ICD-10-CM

## 2016-04-18 DIAGNOSIS — K029 Dental caries, unspecified: Secondary | ICD-10-CM

## 2016-04-18 DIAGNOSIS — K08409 Partial loss of teeth, unspecified cause, unspecified class: Secondary | ICD-10-CM

## 2016-04-18 DIAGNOSIS — K08199 Complete loss of teeth due to other specified cause, unspecified class: Secondary | ICD-10-CM

## 2016-04-18 DIAGNOSIS — Z5111 Encounter for antineoplastic chemotherapy: Secondary | ICD-10-CM

## 2016-04-18 DIAGNOSIS — Z01818 Encounter for other preprocedural examination: Secondary | ICD-10-CM

## 2016-04-18 DIAGNOSIS — K0530 Chronic periodontitis, unspecified: Secondary | ICD-10-CM

## 2016-04-18 DIAGNOSIS — M264 Malocclusion, unspecified: Secondary | ICD-10-CM

## 2016-04-18 LAB — MAGNESIUM: Magnesium: 1.4 mg/dl — CL (ref 1.5–2.5)

## 2016-04-18 LAB — COMPREHENSIVE METABOLIC PANEL
ALBUMIN: 3.7 g/dL (ref 3.5–5.0)
ALK PHOS: 75 U/L (ref 40–150)
ALT: 32 U/L (ref 0–55)
AST: 25 U/L (ref 5–34)
Anion Gap: 9 mEq/L (ref 3–11)
BUN: 12.8 mg/dL (ref 7.0–26.0)
CO2: 29 meq/L (ref 22–29)
Calcium: 9.5 mg/dL (ref 8.4–10.4)
Chloride: 100 mEq/L (ref 98–109)
Creatinine: 0.8 mg/dL (ref 0.6–1.1)
GLUCOSE: 117 mg/dL (ref 70–140)
POTASSIUM: 3.8 meq/L (ref 3.5–5.1)
SODIUM: 138 meq/L (ref 136–145)
TOTAL PROTEIN: 7.5 g/dL (ref 6.4–8.3)
Total Bilirubin: 0.62 mg/dL (ref 0.20–1.20)

## 2016-04-18 LAB — CBC WITH DIFFERENTIAL/PLATELET
BASO%: 0.1 % (ref 0.0–2.0)
Basophils Absolute: 0 10*3/uL (ref 0.0–0.1)
EOS%: 0.7 % (ref 0.0–7.0)
Eosinophils Absolute: 0.1 10*3/uL (ref 0.0–0.5)
HCT: 38.7 % (ref 34.8–46.6)
HEMOGLOBIN: 12.8 g/dL (ref 11.6–15.9)
LYMPH%: 25.8 % (ref 14.0–49.7)
MCH: 27.9 pg (ref 25.1–34.0)
MCHC: 33.1 g/dL (ref 31.5–36.0)
MCV: 84.5 fL (ref 79.5–101.0)
MONO#: 0.3 10*3/uL (ref 0.1–0.9)
MONO%: 4.6 % (ref 0.0–14.0)
NEUT%: 68.8 % (ref 38.4–76.8)
NEUTROS ABS: 4.7 10*3/uL (ref 1.5–6.5)
Platelets: 214 10*3/uL (ref 145–400)
RBC: 4.58 10*6/uL (ref 3.70–5.45)
RDW: 13.2 % (ref 11.2–14.5)
WBC: 6.9 10*3/uL (ref 3.9–10.3)
lymph#: 1.8 10*3/uL (ref 0.9–3.3)

## 2016-04-18 MED ORDER — POTASSIUM CHLORIDE 2 MEQ/ML IV SOLN
Freq: Once | INTRAVENOUS | Status: AC
  Administered 2016-04-18: 12:00:00 via INTRAVENOUS
  Filled 2016-04-18: qty 1000

## 2016-04-18 MED ORDER — LORAZEPAM 1 MG PO TABS
1.0000 mg | ORAL_TABLET | Freq: Once | ORAL | Status: AC | PRN
  Administered 2016-04-18: 1 mg via ORAL

## 2016-04-18 MED ORDER — LORAZEPAM 1 MG PO TABS
ORAL_TABLET | ORAL | Status: AC
  Filled 2016-04-18: qty 1

## 2016-04-18 MED ORDER — SODIUM CHLORIDE 0.9 % IV SOLN
Freq: Once | INTRAVENOUS | Status: AC
  Administered 2016-04-18: 12:00:00 via INTRAVENOUS

## 2016-04-18 MED ORDER — ONDANSETRON HCL 4 MG/2ML IJ SOLN
8.0000 mg | Freq: Once | INTRAMUSCULAR | Status: AC
  Administered 2016-04-18: 8 mg via INTRAVENOUS

## 2016-04-18 MED ORDER — SODIUM CHLORIDE 0.9 % IV SOLN
Freq: Once | INTRAVENOUS | Status: AC
  Administered 2016-04-18: 15:00:00 via INTRAVENOUS
  Filled 2016-04-18: qty 5

## 2016-04-18 MED ORDER — ONDANSETRON HCL 4 MG/2ML IJ SOLN
INTRAMUSCULAR | Status: AC
  Filled 2016-04-18: qty 2

## 2016-04-18 MED ORDER — SODIUM CHLORIDE 0.9 % IV SOLN
40.0000 mg/m2 | Freq: Once | INTRAVENOUS | Status: AC
  Administered 2016-04-18: 70 mg via INTRAVENOUS
  Filled 2016-04-18: qty 70

## 2016-04-18 NOTE — Progress Notes (Signed)
POST OPERATIVE NOTE:  04/18/2016 Valerie Kerr OA:[id number]  VITALS: BP 119/62 (BP Location: Left Arm)   Pulse 87   Temp 98.4 F (36.9 C) (Oral)   LABS:  Lab Results  Component Value Date   WBC 11.1 (H) 04/14/2016   HGB 12.6 04/14/2016   HCT 38.9 04/14/2016   MCV 85.2 04/14/2016   PLT 283 04/14/2016   BMET    Component Value Date/Time   NA 138 04/14/2016 1013   K 3.9 04/14/2016 1013   CL 100 (L) 04/08/2016 0630   CO2 31 (H) 04/14/2016 1013   GLUCOSE 88 04/14/2016 1013   BUN 15.0 04/14/2016 1013   CREATININE 0.8 04/14/2016 1013   CALCIUM 9.4 04/14/2016 1013   GFRNONAA >60 04/08/2016 0630   GFRAA >60 04/08/2016 0630    No results found for: INR, PROTIME No results found for: PTT   Valerie Kerr is status post Multiple extractions with alveoloplasty and gross debridement of remaining dentition in the operating room on 04/08/2016. Patient now presents for evaluation of healing and suture removal.  SUBJECTIVE: Patient denies having any problems from dental extraction sites. Several stitches remain.  EXAM: There is no sign of infection, heme, or ooze. Sutures are loosely intact. Patient appears to be healing in well by a combination a primary closure and secondary intention.  PROCEDURE: The patient was given a chlorhexidine gluconate rinse for 30 seconds. Sutures were then removed without complication. Patient tolerated the procedure well.  ASSESSMENT: Post operative course is consistent with dental procedures performed in the operating room on 04/08/2016. Patient is cleared for start of chemotherapy.  PLAN: 1. Continue salt water rinses as needed to aid healing. 2. Advance diet as tolerated. 3. Brush teeth after meals and at bedtime. Floss at bedtime. 4. Return to clinic for evaluation in 6 weeks after the chemotherapy is complete. Patient is to then follow-up with a dentist of her choice for continued dental restorations, periodontal therapy, and evaluation for  replacement missing teeth as indicated.   Lenn Cal, DDS

## 2016-04-18 NOTE — Patient Instructions (Signed)
PLAN: 1. Continue salt water rinses as needed to aid healing. 2. Advance diet as tolerated. 3. Brush teeth after meals and at bedtime. Floss at bedtime. 4. Return to clinic for evaluation in 6 weeks after the chemotherapy is complete. Patient is to then follow-up with a dentist of her choice for continued dental restorations, periodontal therapy, and evaluation for replacement missing teeth as indicated.   Lenn Cal, DDS

## 2016-04-18 NOTE — Progress Notes (Signed)
Met with patient to have her sign Scientist, clinical (histocompatibility and immunogenetics) for [employer]. Obtained signature from physician. Faxed application to [employer]. Fax received ok per confirmation sheet. Patient was asking about paying for pain medication received at the clinic. Advised her that her grant can only be used for medications written by our physician and must be used at the outpatient pharmacy. Referred patient to contact [facility] to see if any other resources are available to help with outside meds. Gave patient contact information to [employer] [facility] to maybe get established there and apply for orange card which will only help with services provided here and there. Patient seemed more concerned about the pain meds.  Patient has my card for any additional financial questions or concerns.

## 2016-04-18 NOTE — Patient Instructions (Signed)
[facility] Discharge Instructions for Patients Receiving Chemotherapy  Today you received the following chemotherapy agents Cisplatin  To help prevent nausea and vomiting after your treatment, we encourage you to take your nausea medication as needed   If you develop nausea and vomiting that is not controlled by your nausea medication, call the clinic.   BELOW ARE SYMPTOMS THAT SHOULD BE REPORTED IMMEDIATELY:  *FEVER GREATER THAN 100.5 F  *CHILLS WITH OR WITHOUT FEVER  NAUSEA AND VOMITING THAT IS NOT CONTROLLED WITH YOUR NAUSEA MEDICATION  *UNUSUAL SHORTNESS OF BREATH  *UNUSUAL BRUISING OR BLEEDING  TENDERNESS IN MOUTH AND THROAT WITH OR WITHOUT PRESENCE OF ULCERS  *URINARY PROBLEMS  *BOWEL PROBLEMS  UNUSUAL RASH Items with * indicate a potential emergency and should be followed up as soon as possible.  Feel free to call the clinic you have any questions or concerns. The clinic phone number is [phone].  Please show the CHEMO ALERT CARD at check-in to the Emergency Department and triage nurse.   

## 2016-04-19 ENCOUNTER — Encounter: Payer: Self-pay | Admitting: Oncology

## 2016-04-19 ENCOUNTER — Ambulatory Visit
Admission: RE | Admit: 2016-04-19 | Discharge: 2016-04-19 | Disposition: A | Discharge status: Home / Self Care | Payer: Self-pay | Source: Ambulatory Visit | Attending: Radiation Oncology | Admitting: Radiation Oncology

## 2016-04-19 ENCOUNTER — Ambulatory Visit
Admission: RE | Admit: 2016-04-19 | Discharge: 2016-04-19 | Disposition: A | Discharge status: Home / Self Care | Payer: Medicaid Other | Source: Ambulatory Visit | Attending: Radiation Oncology | Admitting: Radiation Oncology

## 2016-04-19 NOTE — Progress Notes (Signed)
Patient came in office with letter from [place] showing approval for [facility] Medicaid only.   I reviewed the information and had patient to sign transportation assistance form due to stating that her daughter has her own appointments and cannot always bring her. Printed itemized statements that were requested and had her sign an authorization form for them to contact me if any other information was needed. Faxed information to her caseworker [email or web address] DSS. Fax received ok per confirmation sheet.  Patient also received a gas card today from her grant. Patient has my card for any additional financial questions or concerns.

## 2016-04-19 NOTE — Progress Notes (Signed)
Medical Oncology  Magnesium low at 1.4 on 04-18-16.   Discussed with [facility] pharmacist, magnesium increased in IVF with chemo  L.Marko Plume MD

## 2016-04-20 ENCOUNTER — Ambulatory Visit: Payer: Self-pay | Admitting: Radiation Oncology

## 2016-04-20 ENCOUNTER — Ambulatory Visit
Admission: RE | Admit: 2016-04-20 | Discharge: 2016-04-20 | Disposition: A | Discharge status: Home / Self Care | Payer: Medicaid Other | Source: Ambulatory Visit | Attending: Radiation Oncology | Admitting: Radiation Oncology

## 2016-04-20 ENCOUNTER — Ambulatory Visit: Admission: RE | Admit: 2016-04-20 | Payer: MEDICAID | Source: Ambulatory Visit | Admitting: Radiation Oncology

## 2016-04-20 ENCOUNTER — Ambulatory Visit: Payer: Self-pay

## 2016-04-21 ENCOUNTER — Ambulatory Visit
Admission: RE | Admit: 2016-04-21 | Discharge: 2016-04-21 | Disposition: A | Discharge status: Home / Self Care | Payer: Medicaid Other | Source: Ambulatory Visit | Attending: Radiation Oncology | Admitting: Radiation Oncology

## 2016-04-21 ENCOUNTER — Telehealth: Payer: Self-pay

## 2016-04-21 NOTE — Telephone Encounter (Signed)
Pt walked in to ask if Dr Marko Plume would take over prescribing her methadone. The pt is having monetary difficulty. The pt has grant money at [place] she wants to use for this, and it is cheaper at [facility] outpatient pharmacy for her. Pt sees a doctor at [facility] twice a week. She has asked him about transferring methadone to [facility] while under treatment.  She cannot pronounce MDs name, she says to ask for Antony Haste the RN with MD. Valerie Kerr # [phone].  Explained this would be brought to Dr Mariana Kaufman attention Dr Marko Plume will probably have to contact treating MD first,  and Dr Marko Plume is retiring on the 31st so it may be up to new MD to decide.  The pt also wanted an answer by tomorrow b/c that is her next appt at [facility]. Explained this would probably take longer and to keep tomorrow's appt. Her next appt at [facility] is Feb 1.

## 2016-04-22 ENCOUNTER — Ambulatory Visit
Admission: RE | Admit: 2016-04-22 | Discharge: 2016-04-22 | Disposition: A | Discharge status: Home / Self Care | Payer: Medicaid Other | Source: Ambulatory Visit | Attending: Radiation Oncology | Admitting: Radiation Oncology

## 2016-04-22 NOTE — Telephone Encounter (Signed)
S/w pt that Dr Marko Plume said to continue methadone at [facility] b/c it is not cancer related. Pt was upset with answer. Pt does have appt with Dr Marko Plume on 1/29 and suggested she ask her about this directly at that time. Pt was agreeable and verified appt times for lab xrt infusion and MD.

## 2016-04-24 ENCOUNTER — Other Ambulatory Visit: Payer: Self-pay | Admitting: Oncology

## 2016-04-24 DIAGNOSIS — C539 Malignant neoplasm of cervix uteri, unspecified: Secondary | ICD-10-CM

## 2016-04-25 ENCOUNTER — Ambulatory Visit
Admission: RE | Admit: 2016-04-25 | Discharge: 2016-04-25 | Disposition: A | Discharge status: Home / Self Care | Payer: Medicaid Other | Source: Ambulatory Visit | Attending: Radiation Oncology | Admitting: Radiation Oncology

## 2016-04-25 ENCOUNTER — Other Ambulatory Visit (HOSPITAL_BASED_OUTPATIENT_CLINIC_OR_DEPARTMENT_OTHER): Payer: Self-pay

## 2016-04-25 ENCOUNTER — Ambulatory Visit (HOSPITAL_BASED_OUTPATIENT_CLINIC_OR_DEPARTMENT_OTHER): Payer: Self-pay | Admitting: Oncology

## 2016-04-25 ENCOUNTER — Encounter: Payer: Self-pay | Admitting: Oncology

## 2016-04-25 ENCOUNTER — Other Ambulatory Visit: Payer: Self-pay | Admitting: Oncology

## 2016-04-25 ENCOUNTER — Ambulatory Visit (HOSPITAL_BASED_OUTPATIENT_CLINIC_OR_DEPARTMENT_OTHER): Payer: Self-pay

## 2016-04-25 VITALS — BP 129/68 | HR 77 | Temp 98.8°F | Resp 18

## 2016-04-25 DIAGNOSIS — C539 Malignant neoplasm of cervix uteri, unspecified: Secondary | ICD-10-CM

## 2016-04-25 DIAGNOSIS — I10 Essential (primary) hypertension: Secondary | ICD-10-CM

## 2016-04-25 DIAGNOSIS — Z720 Tobacco use: Secondary | ICD-10-CM

## 2016-04-25 DIAGNOSIS — F1120 Opioid dependence, uncomplicated: Secondary | ICD-10-CM

## 2016-04-25 DIAGNOSIS — Z5111 Encounter for antineoplastic chemotherapy: Secondary | ICD-10-CM

## 2016-04-25 DIAGNOSIS — F1190 Opioid use, unspecified, uncomplicated: Secondary | ICD-10-CM

## 2016-04-25 LAB — COMPREHENSIVE METABOLIC PANEL
ALT: 33 U/L (ref 0–55)
AST: 26 U/L (ref 5–34)
Albumin: 3.8 g/dL (ref 3.5–5.0)
Alkaline Phosphatase: 79 U/L (ref 40–150)
Anion Gap: 11 mEq/L (ref 3–11)
BILIRUBIN TOTAL: 0.57 mg/dL (ref 0.20–1.20)
BUN: 10.4 mg/dL (ref 7.0–26.0)
CHLORIDE: 101 meq/L (ref 98–109)
CO2: 29 meq/L (ref 22–29)
CREATININE: 0.8 mg/dL (ref 0.6–1.1)
Calcium: 8.8 mg/dL (ref 8.4–10.4)
EGFR: >90 mL/min/{1.73_m2} (ref >90)
GLUCOSE: 102 mg/dL (ref 70–140)
Potassium: 3.5 mEq/L (ref 3.5–5.1)
SODIUM: 141 meq/L (ref 136–145)
TOTAL PROTEIN: 7.5 g/dL (ref 6.4–8.3)

## 2016-04-25 LAB — CBC WITH DIFFERENTIAL/PLATELET
BASO%: 0.4 % (ref 0.0–2.0)
Basophils Absolute: 0 10*3/uL (ref 0.0–0.1)
EOS%: 1.1 % (ref 0.0–7.0)
Eosinophils Absolute: 0.1 10*3/uL (ref 0.0–0.5)
HCT: 36.4 % (ref 34.8–46.6)
HGB: 12 g/dL (ref 11.6–15.9)
LYMPH%: 16.3 % (ref 14.0–49.7)
MCH: 27.9 pg (ref 25.1–34.0)
MCHC: 33 g/dL (ref 31.5–36.0)
MCV: 84.5 fL (ref 79.5–101.0)
MONO#: 0.4 10*3/uL (ref 0.1–0.9)
MONO%: 6.6 % (ref 0.0–14.0)
NEUT%: 75.6 % (ref 38.4–76.8)
NEUTROS ABS: 4.1 10*3/uL (ref 1.5–6.5)
PLATELETS: 158 10*3/uL (ref 145–400)
RBC: 4.31 10*6/uL (ref 3.70–5.45)
RDW: 12.8 % (ref 11.2–14.5)
WBC: 5.4 10*3/uL (ref 3.9–10.3)
lymph#: 0.9 10*3/uL (ref 0.9–3.3)

## 2016-04-25 LAB — MAGNESIUM: Magnesium: 1.2 mg/dl — CL (ref 1.5–2.5)

## 2016-04-25 MED ORDER — SODIUM CHLORIDE 0.9 % IV SOLN
40.0000 mg/m2 | Freq: Once | INTRAVENOUS | Status: AC
  Administered 2016-04-25: 70 mg via INTRAVENOUS
  Filled 2016-04-25: qty 70

## 2016-04-25 MED ORDER — POTASSIUM CHLORIDE 2 MEQ/ML IV SOLN
Freq: Once | INTRAVENOUS | Status: AC
  Administered 2016-04-25: 12:00:00 via INTRAVENOUS
  Filled 2016-04-25: qty 1000

## 2016-04-25 MED ORDER — ONDANSETRON HCL 8 MG PO TABS
ORAL_TABLET | ORAL | Status: AC
  Filled 2016-04-25: qty 1

## 2016-04-25 MED ORDER — LORAZEPAM 1 MG PO TABS
1.0000 mg | ORAL_TABLET | Freq: Once | ORAL | Status: AC | PRN
  Administered 2016-04-25: 1 mg via ORAL

## 2016-04-25 MED ORDER — SODIUM CHLORIDE 0.9 % IV SOLN
Freq: Once | INTRAVENOUS | Status: AC
  Administered 2016-04-25: 14:00:00 via INTRAVENOUS
  Filled 2016-04-25: qty 5

## 2016-04-25 MED ORDER — ONDANSETRON HCL 4 MG/2ML IJ SOLN
8.0000 mg | Freq: Once | INTRAMUSCULAR | Status: AC
  Administered 2016-04-25: 8 mg via INTRAVENOUS

## 2016-04-25 MED ORDER — MAGNESIUM OXIDE -MG SUPPLEMENT 400 MG PO CAPS
400.0000 mg | ORAL_CAPSULE | Freq: Every day | ORAL | 0 refills | Status: DC
Start: 2016-04-25 — End: 2016-05-09

## 2016-04-25 MED ORDER — ONDANSETRON HCL 4 MG/2ML IJ SOLN
INTRAMUSCULAR | Status: AC
  Filled 2016-04-25: qty 4

## 2016-04-25 MED ORDER — LISINOPRIL-HYDROCHLOROTHIAZIDE 10-12.5 MG PO TABS: 1.0000 | ORAL_TABLET | Freq: Every day | ORAL | 1 refills | Status: DC

## 2016-04-25 MED ORDER — SODIUM CHLORIDE 0.9 % IV SOLN
Freq: Once | INTRAVENOUS | Status: AC
  Administered 2016-04-25: 11:00:00 via INTRAVENOUS

## 2016-04-25 MED ORDER — LORAZEPAM 1 MG PO TABS
ORAL_TABLET | ORAL | Status: AC
  Filled 2016-04-25: qty 1

## 2016-04-25 MED FILL — LISINOPRIL-HCTZ 10-12.5 MG: 10-12.5 | 30 days supply | Qty: 30 | Fill #0

## 2016-04-25 MED FILL — MAGNESIUM OXIDE 400 MG TAB: 400 (240 MG | 30 days supply | Qty: 30 | Fill #0

## 2016-04-25 NOTE — Progress Notes (Signed)
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 Radiation is planned thru 05-24-16. Patient is tolerating treatment adequately, however magnesium is lower on labs today.   Patient complains of vague abdominal discomfort, without frank nausea or vomiting, feels she is somewhat constipated, has not had radiation diarrhea. Appetite is decreased with the abdominal symptoms, tho she is still eating and drinking liquids. No different pelvic discomfort. Denies increased SOB or other respiratory symptoms, continues to smoke tho she is aware of recommendations to DC. No bleeding. No fever or symptoms of infection. Peripheral IV access has been managed. No LE swelling. Voiding ok. No concerns re recent dental extractions. Remainder of 14 point Review of Systems negative.    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  ONCOLOGIC HISTORY Patient presented to ED 02-20-16 with postmenopausal bleeding which had been intermittent x 2 years. She was seen by gyn with cervical biopsy 03-01-16 showing poorly differentiated squamous cell carcinoma. There was difficulty reaching patient by phone with those results; she did have follow up with Dr [name] on 03-16-16, but cancelled planned PET due to anxiety. She saw Dr [name] on 03-23-16, with cervix replaced by 6 cm friable mass encroaching on upper vaginal fornices and right parametrial involvement. Staging PET 04-04-16 shows no apparent distant disease, with some minimal uptake in sub-cm left axillary nodes which are thought likely reactive. Sensitizing CDDP begun 04-12-16 with radiation.   Objective:  Vital signs in last 24 hours: 129/68, 77 regular, 18  not labored RA, 98.8, 99% sat  Alert, oriented and appropriate, looks comfortable in recliner with IV infusing LUE at antecubital. Respirations not labored RA  No alopecia  HEENT:PERRL, sclerae not icteric. Oral mucosa moist without lesions, areas of dental extractions without evidence of infection or bleeding. Neck supple. No JVD.  Lymphatics:no supraclavicular adenopathy Resp: clear to auscultation bilaterally  Cardio: regular rate and rhythm. No gallop. GI: abdomen soft, nontender, not distended, no mass or organomegaly. Some bowel sounds.  Musculoskeletal/ Extremities: LE without pitting edema, cords, tenderness Neuro: no peripheral neuropathy. Speech fluent, moves all extremities. PSYCH appropriate mood and affect Skin without rash, ecchymosis, petechiae   Lab Results:  Results for orders placed or performed in visit on 04/25/16  CBC with Differential  Result Value Ref Range   WBC 5.4 3.9 - 10.3 10e3/uL   NEUT# 4.1 1.5 - 6.5 10e3/uL   HGB 12.0 11.6 - 15.9 g/dL   HCT 36.4 34.8 - 46.6 %   Platelets 158 145 - 400 10e3/uL   MCV 84.5 79.5 - 101.0 fL   MCH 27.9 25.1 - 34.0 pg   MCHC 33.0 31.5 - 36.0 g/dL   RBC 4.31 3.70 - 5.45 10e6/uL   RDW 12.8 11.2 - 14.5 %   lymph# 0.9 0.9 - 3.3 10e3/uL   MONO# 0.4 0.1 - 0.9 10e3/uL   Eosinophils Absolute 0.1 0.0 - 0.5 10e3/uL   Basophils Absolute 0.0 0.0 - 0.1 10e3/uL   NEUT% 75.6 38.4 - 76.8 %   LYMPH% 16.3 14.0 - 49.7 %   MONO% 6.6 0.0 - 14.0 %   EOS% 1.1 0.0 - 7.0 %   BASO% 0.4 0.0 - 2.0 %  Comprehensive metabolic panel  Result Value Ref Range   Sodium 141 136 - 145 mEq/L   Potassium 3.5 3.5 - 5.1 mEq/L   Chloride 101 98 - 109 mEq/L   CO2 29 22 - 29 mEq/L   Glucose 102 70 - 140 mg/dl   BUN 10.4 7.0 - 26.0 mg/dL   Creatinine 0.8 0.6 - 1.1 mg/dL   Total Bilirubin 0.57 0.20 - 1.20 mg/dL   Alkaline Phosphatase 79 40 - 150 U/L   AST 26 5 - 34 U/L   ALT 33 0 - 55 U/L   Total Protein 7.5 6.4 - 8.3 g/dL   Albumin 3.8 3.5 - 5.0 g/dL    Calcium 8.8 8.4 - 10.4 mg/dL   Anion Gap 11 3 - 11 mEq/L   EGFR >90 >90 ml/min/1.73 m2  Magnesium  Result Value Ref Range   Magnesium 1.2 (LL) 1.5 - 2.5 mg/dl    Magnesium 1.4 with #2 CDDP last week, IV magnesium increased then. Discussed with [facility] pharmacist, further increase in IV magnesium (see chemo flowsheets).  Studies/Results:  No results found.  Medications: I have reviewed the patient's current medications. Will add oral mag oxide 400 mg daily Patient out of antihypertensive as of today, possibly prescribed by previous PCP. Will refill #30 now.  DISCUSSION Discussed abdominal symptoms, seems more constipation now. Will add oral magnesium, which should help with both some constipation and magnesium loss from CDDP; she understands that she should hold oral magnesium if diarrhea. Have encouraged bland diet and good oral hydration.  Patient asks again today if this MD will prescribe her methadone, so that she can use patient assistance funds thru [place] for this. As she was told yesterday,  methadone will not be prescribed by this MD. She is established with [facility] for all management of methadone; methadone is not for her cancer diagnosis.   Needs PCP, likely best able to do this thru [facility] and [facility] [phone]. That clinic is not taking new patient referrals until Apr 28 828, first come first serve until they fill their slots. If the patient misses appointment, they cannot be rescheduled.  Collaborative RNs aware of this per my message previously.  Assessment/Plan:  1.Poorly differentiated squamous cell carcinoma of cervix: IIB by gyn oncology exam, PET 04-04-16 without clear distant disease. Weekly sensitizing CDDP begun 04-12-16 with pelvic radiation;  intracavitary brachytherapy also planned. No heavy vaginal bleeding now. #3 CDDP today.  Expect CDDP thru at least 05-16-16 if tolerates; radiation scheduled thru 2-27.   2.hypomagnesemia with K lower normal range: related to CDDP, and despite increased IV magnesium with treatment last week. Additional magnesium in IVF today and subsequent treatments. Add oral magnesium as above. FollowMg and K 3.on methadone by [employer] Per patient, this is for chronic back pain, however information from that clinic is not in this EMR. This is not related to her cancer diagnosis and will not be prescribed by this MD.  4. post multiple dental extractions 04-08-16 by Dr Enrique Sack 5.history HTN. "borderline heart size" by CXR 11-2015 Needs PCP, see above. Refill antihypertensive x 1 month now.  6. Information for advance directives given previously 7.post surgery for cervical disc disease.  8.flu vaccine 04-01-16 9.GERD and constipation related to pain medication. Needs PCP 10.No mammograms or colonoscopy, hopefully can be addressed after present treatments completed 11. Flu vaccine 04-01-16 12. long and ongoing tobacco x 45 years, which she is interested in stopping, has decreased. CXR 11-2015 no findings of concern. Smoking  cessation encouraged.  All questions answered and patient understands recommendations and plans. Discussed with infusion RN and [facility] pharmacist. Time spent 25 min including >50% counseling and coordination of care. Cc Dr Sondra Come   Evlyn Clines, MD   04/25/2016, 5:38 PM

## 2016-04-25 NOTE — Progress Notes (Unsigned)
Received prescription form from [employer] for [employer]. Obtained physician's signature. Faxed back to [employer]. Fax received ok per confirmation sheet.  Received voicemail from [employer] to call and arrange shipment for Emend for patient. Returned call to [employer]([place]) and verified shipping information. They will be sending two vials out tomorrow(one for today and one for 05/02/16. Additional refills if needed would need to be called in to [employer] Patient Assistance Program@800 [phone] opt.3  5-7 days prior to patient's treatment date. Patient approved for Emend for free. I requested approval letter be sent via fax to my attention.   Received approval letter. Patient approved 04/25/16-04/25/17. Email sent to Drug Replacement Communication Team. Copy placed to be scanned by HIM as well as copy given to pharmacy.

## 2016-04-25 NOTE — Progress Notes (Signed)
Dr Marko Plume aware of Mg 1.2, and is adding additional Mg to hydration fluids for [place] today

## 2016-04-25 NOTE — Patient Instructions (Signed)
[facility] Discharge Instructions for Patients Receiving Chemotherapy  Today you received the following chemotherapy agents Cisplatin  To help prevent nausea and vomiting after your treatment, we encourage you to take your nausea medication as needed   If you develop nausea and vomiting that is not controlled by your nausea medication, call the clinic.   BELOW ARE SYMPTOMS THAT SHOULD BE REPORTED IMMEDIATELY:  *FEVER GREATER THAN 100.5 F  *CHILLS WITH OR WITHOUT FEVER  NAUSEA AND VOMITING THAT IS NOT CONTROLLED WITH YOUR NAUSEA MEDICATION  *UNUSUAL SHORTNESS OF BREATH  *UNUSUAL BRUISING OR BLEEDING  TENDERNESS IN MOUTH AND THROAT WITH OR WITHOUT PRESENCE OF ULCERS  *URINARY PROBLEMS  *BOWEL PROBLEMS  UNUSUAL RASH Items with * indicate a potential emergency and should be followed up as soon as possible.  Feel free to call the clinic you have any questions or concerns. The clinic phone number is [phone].  Please show the CHEMO ALERT CARD at check-in to the Emergency Department and triage nurse.   

## 2016-04-26 ENCOUNTER — Ambulatory Visit
Admission: RE | Admit: 2016-04-26 | Discharge: 2016-04-26 | Disposition: A | Discharge status: Home / Self Care | Payer: Self-pay | Source: Ambulatory Visit | Attending: Radiation Oncology | Admitting: Radiation Oncology

## 2016-04-26 ENCOUNTER — Encounter: Payer: Self-pay | Admitting: Oncology

## 2016-04-26 ENCOUNTER — Telehealth: Payer: Self-pay

## 2016-04-26 ENCOUNTER — Encounter: Payer: Self-pay | Admitting: Radiation Oncology

## 2016-04-26 ENCOUNTER — Ambulatory Visit
Admission: RE | Admit: 2016-04-26 | Discharge: 2016-04-26 | Disposition: A | Discharge status: Home / Self Care | Payer: Medicaid Other | Source: Ambulatory Visit | Attending: Radiation Oncology | Admitting: Radiation Oncology

## 2016-04-26 VITALS — BP 149/81 | HR 76 | Temp 98.5°F | Resp 16 | Wt 156.8 lb

## 2016-04-26 DIAGNOSIS — C539 Malignant neoplasm of cervix uteri, unspecified: Secondary | ICD-10-CM

## 2016-04-26 NOTE — Progress Notes (Signed)
Weekly radiation pelvis 10/30 completed, mild  Discharge bleeding on panty liner every other day, last bowel movement 2 days ago" but gas coming out " no skin irritation, not using biafine yet, appetite fair,   her anxiety up started taking magnesium  Yesterday per Dr,[place], but afraid it will make her have more difficulty swallowing  No pain at present BP (!) 149/81 (BP Location: Left Arm, Patient Position: Sitting, Cuff Size: Normal)   Pulse 76   Temp 98.5 F (36.9 C) (Oral)   Resp 16   Wt 156 lb 12.8 oz (71.1 kg)   BMI 29.63 kg/m   Wt Readings from Last 3 Encounters:  04/26/16 156 lb 12.8 oz (71.1 kg)  04/14/16 159 lb (72.1 kg)  04/12/16 156 lb 6.4 oz (70.9 kg)

## 2016-04-26 NOTE — Telephone Encounter (Signed)
Pt asked what was the date when Dr Marko Plume told her not to return to work. She described the visit after the PET scan when she was given her chemo plan. Pt was given date 04/11/16. Pt was told this is the date of the visit that matches her description. Dr Marko Plume did not specifically address work issues in her OV note. 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

## 2016-04-26 NOTE — Progress Notes (Signed)
  Radiation Oncology         [phone] ________________________________  Name: Valerie Kerr MRN: [id number]  Date: 04/26/2016  DOB: Sep 09, 1960  Weekly Radiation Therapy Management    ICD-9-CM ICD-10-CM   1. Malignant neoplasm of cervix, unspecified site ([place]) 180.9 C53.9      Current Dose: 18 Gy     Planned Dose:  54+ Gy  Narrative . . . . . . . . The patient presents for routine under treatment assessment.                                    Valerie Kerr has completed 10 fraction to her pelvis.  She denies pain currently. She reports mild discharge and blood on panty liner every other day. Her last bowel movement was 2 days ago, but she is passing flatus. She denies diarrhea. The patient reports a fair appetite. She reports Dr. Marko Plume started her on a magnesium supplement, but the patient is having anxiety about this since reading side effects online. She is concerned it will cause her swallowing to be more difficult.                                  Set-up films were reviewed.                                 The chart was checked. Physical Findings. . .  weight is 156 lb 12.8 oz (71.1 kg). Her oral temperature is 98.5 F (36.9 C). Her blood pressure is 149/81 (abnormal) and her pulse is 76. Her respiration is 16.  Weight essentially stable.  Lungs are clear to auscultation bilaterally. Heart has regular rate and rhythm. Abdomen soft, non-tender, normal bowel sounds. Impression . . . . . . . The patient is tolerating radiation. Plan . . . . . . . . . . . . Continue treatment as planned. ________________________________   [name], PhD, MD  This document serves as a record of services personally performed by [name], MD. It was created on his behalf by Maryla Morrow, a trained medical scribe. The creation of this record is based on the scribe's personal observations and the provider's statements to them. This document has been checked and approved by the attending  provider.

## 2016-04-26 NOTE — Telephone Encounter (Signed)
Entered in error

## 2016-04-27 ENCOUNTER — Ambulatory Visit
Admission: RE | Admit: 2016-04-27 | Discharge: 2016-04-27 | Disposition: A | Discharge status: Home / Self Care | Payer: Medicaid Other | Source: Ambulatory Visit | Attending: Radiation Oncology | Admitting: Radiation Oncology

## 2016-04-27 ENCOUNTER — Other Ambulatory Visit: Payer: Self-pay | Admitting: *Deleted

## 2016-04-27 DIAGNOSIS — I10 Essential (primary) hypertension: Secondary | ICD-10-CM

## 2016-04-28 ENCOUNTER — Other Ambulatory Visit: Payer: Self-pay | Admitting: *Deleted

## 2016-04-28 ENCOUNTER — Ambulatory Visit
Admission: RE | Admit: 2016-04-28 | Discharge: 2016-04-28 | Disposition: A | Discharge status: Home / Self Care | Payer: Medicaid Other | Source: Ambulatory Visit | Attending: Radiation Oncology | Admitting: Radiation Oncology

## 2016-04-28 ENCOUNTER — Telehealth: Payer: Self-pay | Admitting: *Deleted

## 2016-04-28 NOTE — Telephone Encounter (Signed)
T[facility] RN, Raina Mina to facilitate pt getting a PCP.

## 2016-04-29 ENCOUNTER — Other Ambulatory Visit: Payer: Self-pay | Admitting: *Deleted

## 2016-04-29 ENCOUNTER — Ambulatory Visit
Admission: RE | Admit: 2016-04-29 | Discharge: 2016-04-29 | Disposition: A | Discharge status: Home / Self Care | Payer: Medicaid Other | Source: Ambulatory Visit | Attending: Radiation Oncology | Admitting: Radiation Oncology

## 2016-05-02 ENCOUNTER — Ambulatory Visit (HOSPITAL_BASED_OUTPATIENT_CLINIC_OR_DEPARTMENT_OTHER): Payer: Self-pay

## 2016-05-02 ENCOUNTER — Ambulatory Visit
Admission: RE | Admit: 2016-05-02 | Discharge: 2016-05-02 | Disposition: A | Discharge status: Home / Self Care | Payer: Medicaid Other | Source: Ambulatory Visit | Attending: Radiation Oncology | Admitting: Radiation Oncology

## 2016-05-02 ENCOUNTER — Other Ambulatory Visit (HOSPITAL_BASED_OUTPATIENT_CLINIC_OR_DEPARTMENT_OTHER): Payer: Self-pay

## 2016-05-02 ENCOUNTER — Other Ambulatory Visit: Payer: Self-pay | Admitting: Hematology and Oncology

## 2016-05-02 VITALS — BP 121/71 | HR 79 | Temp 98.4°F | Resp 18

## 2016-05-02 DIAGNOSIS — C539 Malignant neoplasm of cervix uteri, unspecified: Secondary | ICD-10-CM

## 2016-05-02 DIAGNOSIS — F1120 Opioid dependence, uncomplicated: Secondary | ICD-10-CM

## 2016-05-02 DIAGNOSIS — F1190 Opioid use, unspecified, uncomplicated: Secondary | ICD-10-CM

## 2016-05-02 DIAGNOSIS — J309 Allergic rhinitis, unspecified: Secondary | ICD-10-CM

## 2016-05-02 DIAGNOSIS — K219 Gastro-esophageal reflux disease without esophagitis: Secondary | ICD-10-CM

## 2016-05-02 DIAGNOSIS — K089 Disorder of teeth and supporting structures, unspecified: Secondary | ICD-10-CM

## 2016-05-02 DIAGNOSIS — I10 Essential (primary) hypertension: Secondary | ICD-10-CM

## 2016-05-02 DIAGNOSIS — Z720 Tobacco use: Secondary | ICD-10-CM

## 2016-05-02 DIAGNOSIS — Z5111 Encounter for antineoplastic chemotherapy: Secondary | ICD-10-CM

## 2016-05-02 LAB — CBC WITH DIFFERENTIAL/PLATELET
BASO%: 0.5 % (ref 0.0–2.0)
BASOS ABS: 0 10*3/uL (ref 0.0–0.1)
EOS%: 0.5 % (ref 0.0–7.0)
Eosinophils Absolute: 0 10*3/uL (ref 0.0–0.5)
HEMATOCRIT: 33.6 % — AB (ref 34.8–46.6)
HEMOGLOBIN: 11 g/dL — AB (ref 11.6–15.9)
LYMPH#: 0.8 10*3/uL — AB (ref 0.9–3.3)
LYMPH%: 20.2 % (ref 14.0–49.7)
MCH: 27.6 pg (ref 25.1–34.0)
MCHC: 32.8 g/dL (ref 31.5–36.0)
MCV: 84 fL (ref 79.5–101.0)
MONO#: 0.4 10*3/uL (ref 0.1–0.9)
MONO%: 9.2 % (ref 0.0–14.0)
NEUT%: 69.6 % (ref 38.4–76.8)
NEUTROS ABS: 2.9 10*3/uL (ref 1.5–6.5)
Platelets: 209 10*3/uL (ref 145–400)
RBC: 3.99 10*6/uL (ref 3.70–5.45)
RDW: 12.7 % (ref 11.2–14.5)
WBC: 4.2 10*3/uL (ref 3.9–10.3)

## 2016-05-02 LAB — COMPREHENSIVE METABOLIC PANEL
ALBUMIN: 3.8 g/dL (ref 3.5–5.0)
ALK PHOS: 88 U/L (ref 40–150)
ALT: 32 U/L (ref 0–55)
AST: 25 U/L (ref 5–34)
Anion Gap: 10 mEq/L (ref 3–11)
BUN: 14.7 mg/dL (ref 7.0–26.0)
CALCIUM: 8.3 mg/dL — AB (ref 8.4–10.4)
CO2: 34 mEq/L — ABNORMAL HIGH (ref 22–29)
CREATININE: 0.9 mg/dL (ref 0.6–1.1)
Chloride: 97 mEq/L — ABNORMAL LOW (ref 98–109)
EGFR: 82 mL/min/{1.73_m2} — ABNORMAL LOW (ref >90)
GLUCOSE: 91 mg/dL (ref 70–140)
Potassium: 3.1 mEq/L — ABNORMAL LOW (ref 3.5–5.1)
Sodium: 140 mEq/L (ref 136–145)
TOTAL PROTEIN: 7.4 g/dL (ref 6.4–8.3)
Total Bilirubin: 0.43 mg/dL (ref 0.20–1.20)

## 2016-05-02 LAB — MAGNESIUM: MAGNESIUM: 1.5 mg/dL (ref 1.5–2.5)

## 2016-05-02 MED ORDER — POTASSIUM CHLORIDE 2 MEQ/ML IV SOLN
Freq: Once | INTRAVENOUS | Status: AC
  Administered 2016-05-02: 13:00:00 via INTRAVENOUS
  Filled 2016-05-02: qty 10

## 2016-05-02 MED ORDER — ONDANSETRON HCL 4 MG/2ML IJ SOLN
8.0000 mg | Freq: Once | INTRAMUSCULAR | Status: AC
  Administered 2016-05-02: 8 mg via INTRAVENOUS

## 2016-05-02 MED ORDER — ONDANSETRON HCL 4 MG/2ML IJ SOLN
INTRAMUSCULAR | Status: AC
  Filled 2016-05-02: qty 4

## 2016-05-02 MED ORDER — SODIUM CHLORIDE 0.9 % IV SOLN
40.0000 mg/m2 | Freq: Once | INTRAVENOUS | Status: AC
  Administered 2016-05-02: 70 mg via INTRAVENOUS
  Filled 2016-05-02: qty 70

## 2016-05-02 MED ORDER — LORAZEPAM 1 MG PO TABS: 1.0000 mg | ORAL_TABLET | Freq: Once | ORAL | Status: DC | PRN

## 2016-05-02 MED ORDER — SODIUM CHLORIDE 0.9 % IV SOLN
Freq: Once | INTRAVENOUS | Status: AC
  Administered 2016-05-02: 11:00:00 via INTRAVENOUS

## 2016-05-02 MED ORDER — SODIUM CHLORIDE 0.9 % IV SOLN
Freq: Once | INTRAVENOUS | Status: AC
  Administered 2016-05-02: 15:00:00 via INTRAVENOUS
  Filled 2016-05-02: qty 5

## 2016-05-02 NOTE — Progress Notes (Signed)
05/02/2016 Per Dr. Alvy Bimler ok to run post hydration IVF with Cisplatin as patient received an additional 200 ml of NS prior to Pre IVF.

## 2016-05-02 NOTE — Progress Notes (Signed)
Okay per Dr. Alvy Bimler to give hydration fluid at the same time as Cisplatin today.

## 2016-05-02 NOTE — Patient Instructions (Signed)
[facility] Discharge Instructions for Patients Receiving Chemotherapy  Today you received the following chemotherapy agents Cisplatin  To help prevent nausea and vomiting after your treatment, we encourage you to take your nausea medication as needed   If you develop nausea and vomiting that is not controlled by your nausea medication, call the clinic.   BELOW ARE SYMPTOMS THAT SHOULD BE REPORTED IMMEDIATELY:  *FEVER GREATER THAN 100.5 F  *CHILLS WITH OR WITHOUT FEVER  NAUSEA AND VOMITING THAT IS NOT CONTROLLED WITH YOUR NAUSEA MEDICATION  *UNUSUAL SHORTNESS OF BREATH  *UNUSUAL BRUISING OR BLEEDING  TENDERNESS IN MOUTH AND THROAT WITH OR WITHOUT PRESENCE OF ULCERS  *URINARY PROBLEMS  *BOWEL PROBLEMS  UNUSUAL RASH Items with * indicate a potential emergency and should be followed up as soon as possible.  Feel free to call the clinic you have any questions or concerns. The clinic phone number is [phone].  Please show the CHEMO ALERT CARD at check-in to the Emergency Department and triage nurse.   

## 2016-05-03 ENCOUNTER — Ambulatory Visit
Admission: RE | Admit: 2016-05-03 | Discharge: 2016-05-03 | Disposition: A | Discharge status: Home / Self Care | Payer: Medicaid Other | Source: Ambulatory Visit | Attending: Radiation Oncology | Admitting: Radiation Oncology

## 2016-05-03 ENCOUNTER — Ambulatory Visit
Admission: RE | Admit: 2016-05-03 | Discharge: 2016-05-03 | Disposition: A | Discharge status: Home / Self Care | Payer: Self-pay | Source: Ambulatory Visit | Attending: Radiation Oncology | Admitting: Radiation Oncology

## 2016-05-03 ENCOUNTER — Encounter: Payer: Self-pay | Admitting: Radiation Oncology

## 2016-05-03 ENCOUNTER — Telehealth: Payer: Self-pay | Admitting: Hematology and Oncology

## 2016-05-03 ENCOUNTER — Encounter: Payer: Self-pay | Admitting: Hematology and Oncology

## 2016-05-03 ENCOUNTER — Ambulatory Visit (HOSPITAL_BASED_OUTPATIENT_CLINIC_OR_DEPARTMENT_OTHER): Payer: Self-pay | Admitting: Hematology and Oncology

## 2016-05-03 ENCOUNTER — Telehealth: Payer: Self-pay

## 2016-05-03 VITALS — BP 156/102 | HR 75 | Temp 98.1°F | Ht 61.0 in | Wt 159.4 lb

## 2016-05-03 DIAGNOSIS — C539 Malignant neoplasm of cervix uteri, unspecified: Secondary | ICD-10-CM

## 2016-05-03 DIAGNOSIS — I10 Essential (primary) hypertension: Secondary | ICD-10-CM

## 2016-05-03 DIAGNOSIS — T451X5A Adverse effect of antineoplastic and immunosuppressive drugs, initial encounter: Principal | ICD-10-CM

## 2016-05-03 DIAGNOSIS — F1120 Opioid dependence, uncomplicated: Secondary | ICD-10-CM

## 2016-05-03 DIAGNOSIS — D6481 Anemia due to antineoplastic chemotherapy: Secondary | ICD-10-CM

## 2016-05-03 DIAGNOSIS — Z720 Tobacco use: Secondary | ICD-10-CM

## 2016-05-03 DIAGNOSIS — F1190 Opioid use, unspecified, uncomplicated: Secondary | ICD-10-CM

## 2016-05-03 NOTE — Telephone Encounter (Signed)
Re-ordered Emend 150 mg vial x 2 through [facility] To arrive 05/04/16  Thank you Henreitta Leber, PharmD

## 2016-05-03 NOTE — Progress Notes (Signed)
Valerie Kerr has completed 15 fractions to her pelvis.  She reports having acid reflux and food is getting stuck in her throat.  She said she recently picked up Prilosec and they are not helping her.  She had chemotherapy yesterday.  She denies having any bladder issues.  She reports having constipation.  She said her last bowel movement was yesterday.  She denies having any vaginal bleeding.  She reports having a occasional fatigue.  She also mentioned that her brother passed away and she needs to go to [place] to plan the funeral.  She is not sure how many radiation treatments she can miss.  BP (!) 156/102 (BP Location: Right Arm, Patient Position: Sitting)   Pulse 75   Temp 98.1 F (36.7 C) (Oral)   Ht 5\' 1"  (1.549 m)   Wt 159 lb 6.4 oz (72.3 kg)   SpO2 100%   BMI 30.12 kg/m    Wt Readings from Last 3 Encounters:  05/03/16 159 lb 6.4 oz (72.3 kg)  05/03/16 158 lb 1.6 oz (71.7 kg)  04/26/16 156 lb 12.8 oz (71.1 kg)

## 2016-05-03 NOTE — Progress Notes (Signed)
  Radiation Oncology         [phone] ________________________________  Name: Valerie Kerr MRN: [id number]  Date: 05/03/2016  DOB: 1960/08/03  Weekly Radiation Therapy Management    ICD-9-CM ICD-10-CM   1. Malignant neoplasm of cervix, unspecified site ([place]) 180.9 C53.9      Current Dose: 27 Gy     Planned Dose:  54+ Gy  Narrative . . . . . . . . The patient presents for routine under treatment assessment.                                    Valerie Kerr has completed 15 fractions to her pelvis.  She reports having acid reflux and food is getting stuck in her throat. She said she recently picked up Prilosec and it is not helping her. She had chemotherapy yesterday. She denies having any bladder issues or vaginal bleeding. She reports having constipation and that her last bowel movement was yesterday. She reports having a occasional fatigue. She also mentioned that her brother passed away and she needs to go to [place] to plan the funeral.  She is not sure how many radiation treatments she can miss.                                  Set-up films were reviewed.                                 The chart was checked. Physical Findings. . .  height is 5\' 1"  (1.549 m) and weight is 159 lb 6.4 oz (72.3 kg). Her oral temperature is 98.1 F (36.7 C). Her blood pressure is 156/102 (abnormal) and her pulse is 75. Her oxygen saturation is 100%.   The patient was nervous during the encounter. Lungs are clear to auscultation bilaterally. Heart has regular rate and rhythm. Abdomen soft, non-tender, normal bowel sounds. Impression . . . . . . . The patient is tolerating radiation. Plan . . . . . . . . . . . . Continue treatment as planned. I advised the patient the importance of not missing treatments. . I will have Social Work assist her in regards to the planning of her brother's death. For the patient's acid reflux, The patient will check to see which she was previously on and let us know what  this medication  ________________________________   [name], PhD, MD  This document serves as a record of services personally performed by [name], MD. It was created on his behalf by [name], a trained medical scribe. The creation of this record is based on the scribe's personal observations and the provider's statements to them. This document has been checked and approved by the attending provider.

## 2016-05-03 NOTE — Telephone Encounter (Signed)
Appointments scheduled per 05/03/16 los. Patient was given a copy of the AVS report and appointment schedule,per 05/03/16 los. °

## 2016-05-04 ENCOUNTER — Ambulatory Visit
Admission: RE | Admit: 2016-05-04 | Discharge: 2016-05-04 | Disposition: A | Discharge status: Home / Self Care | Payer: Medicaid Other | Source: Ambulatory Visit | Attending: Radiation Oncology | Admitting: Radiation Oncology

## 2016-05-04 DIAGNOSIS — T451X5A Adverse effect of antineoplastic and immunosuppressive drugs, initial encounter: Principal | ICD-10-CM

## 2016-05-04 DIAGNOSIS — D6481 Anemia due to antineoplastic chemotherapy: Secondary | ICD-10-CM | POA: Insufficient documentation

## 2016-05-04 NOTE — Assessment & Plan Note (Signed)
She is taking this for chronic back pain I will check serum vitamin D in her next visit I made it clear to the patient I will not refill her Methodone She is getting it from [facility] and we are getting her established with local PCP

## 2016-05-04 NOTE — Assessment & Plan Note (Signed)
She tolerated treatment well so far without significant side-effects from treatment I will continue to see her weekly for toxicity review

## 2016-05-04 NOTE — Assessment & Plan Note (Signed)
I spent some time counseling the patient the importance of tobacco cessation. We discussed common strategies including nicotine patches, Tobacco Quit-line, and other nicotine replacement products to assist in hereffort to quit  she appears motivated to quit.  

## 2016-05-04 NOTE — Progress Notes (Signed)
[place] FOLLOW-UP progress notes  Patient Care Team: No Pcp Per Patient as PCP - General (General Practice)  CHIEF COMPLAINTS/PURPOSE OF VISIT:  Unresectable cervical cancer  HISTORY OF PRESENTING ILLNESS:  Valerie Kerr 56 y.o. female was transferred to my care after her prior physician has left.  I reviewed the patient's records extensive and collaborated the history with the patient. Summary of her history is as follows:   Cervical cancer ([place])   02/20/2016 Initial Diagnosis    She presented to the ED with vaginal bleeding and abnormal pelvic mass. She was seen in the office by Dr Baron Sane 03/01/16 who performed cervical biopsies of a friable mass which revealed poorly differentiated squamous cell carcinoma.      03/01/2016 Pathology Results    1. Cervix, biopsy, mass INFILTRATIVE SQUAMOUS CELL CARCINOMA 2. Cervix, biopsy INVASIVE POORLY DIFFERENTIATED SQUAMOUS CELL CARCINOMA      03/03/2016 Imaging    Pelvic US was Suboptimal study due to bowel gas. Difficult visualization of the uterus and adnexal structures appear probable multiple fibroids within the uterus. Endometrium thickened at 14 mm. In the setting of post-menopausal bleeding, endometrial sampling is indicated to exclude carcinoma. If results are benign, sonohysterogram should be considered for focal lesion work-up.       03/23/2016 Miscellaneous    Examination by Dr. [name] in the office concluded the cervix is completely replaced by a 6cm exophytic friable mass which is replacing the cervix and encroaching the upper vaginal fornices and the parametrium bilaterally.      04/04/2016 PET scan    5.5 cm hypermetabolic cervical mass, consistent with known primary cervical carcinoma. No definite local or distant metastatic disease. Sub-cm hypermetabolic left axillary lymph nodes, likely reactive in etiology. Recommend continued attention on follow-up imaging.       04/08/2016 Procedure    Dr. Enrique Sack  performed Multiple extraction of tooth numbers 3, 9, 21, 30, and 31 .  2 Quadrants of alveoloplasty 3. Gross debridement of remaining dentition         04/12/2016 -  Chemotherapy    The patient had weekly concurrent chemotherapy with radiation       04/14/2016 -  Radiation Therapy    The patient had concurrent radiation treatment      She is seen as part of her weekly follow-up. She complained of chronic pain and is requesting me to refill her prescription methadone because it is too cumbersome for her to go to the other clinic. I explained to the patient that I will not be refilling her prescription of methadone. Otherwise, she tolerated treatment well without significant mucositis, nausea, vomiting or neuropathy. She denies recent vaginal bleeding The patient denies any recent signs or symptoms of bleeding such as spontaneous epistaxis, hematuria or hematochezia.  MEDICAL HISTORY:  Past Medical History:  Diagnosis Date  . Acid reflux   . Anxiety   . Cervical cancer ([place])   . Dental caries    periodontitis  . Environmental allergies    allergy to dust mites  . Headache   . Herniated disc, cervical    and lumbar  . Hypertension     SURGICAL HISTORY: Past Surgical History:  Procedure Laterality Date  . ABCESS DRAINAGE    . CARPAL TUNNEL RELEASE Bilateral   . CERVICAL DISC SURGERY    . MULTIPLE EXTRACTIONS WITH ALVEOLOPLASTY N/A 04/08/2016   Procedure: Extraction of tooth #'s [phone] with alveoloplasty  and gross debridement of remaining teeth;  Surgeon: Lenn Cal,  DDS;  Location: [facility];  Service: Oral Surgery;  Laterality: N/A;    SOCIAL HISTORY: Social History   Social History  . Marital status: Divorced    Spouse name: N/A  . Number of children: 5  . Years of education: N/A   Occupational History  . Not on file.   Social History Main Topics  . Smoking status: Current Every Day Smoker    Packs/day: 0.25    Years: 45.00    Types: Cigarettes   . Smokeless tobacco: Never Used  . Alcohol use No  . Drug use: No  . Sexual activity: Not Currently   Other Topics Concern  . Not on file   Social History Narrative  . No narrative on file    FAMILY HISTORY: Family History  Problem Relation Age of Onset  . Cancer Father   . Cancer Sister     throat  . Cancer Brother     throat and stomach  . Cancer Brother     lung    ALLERGIES:  is allergic to codeine; fentanyl; and tramadol.  MEDICATIONS:  Current Outpatient Prescriptions  Medication Sig Dispense Refill  . acetaminophen (TYLENOL) 500 MG tablet Take 500 mg by mouth every 6 (six) hours as needed for mild pain.     Marland Kitchen ALPRAZolam (XANAX) 0.25 MG tablet Take 1 tablet (0.25 mg total) by mouth 2 (two) times daily as needed for anxiety. 4 tablet 0  . emollient (BIAFINE) cream Apply topically 2 (two) times daily.    . fluticasone (FLONASE) 50 MCG/ACT nasal spray Place 1 spray into both nostrils daily.    Marland Kitchen ibuprofen (ADVIL,MOTRIN) 600 MG tablet Take 1 tablet by mouth every 6 hours for 24 hours and then as needed for moderate to severe pain. 30 tablet 0  . lisinopril-hydrochlorothiazide (PRINZIDE) 10-12.5 MG tablet Take 1 tablet by mouth daily. 30 tablet 1  . Magnesium Oxide -Mg Supplement 400 MG CAPS Take 400 mg by mouth daily. Hold if diarrhea. 30 capsule 0  . methadone (DOLOPHINE) 10 MG/5ML solution Take 55 mg by mouth daily.    Marland Kitchen omeprazole (PRILOSEC) 20 MG capsule Take 1 capsule (20 mg total) by mouth daily. 30 capsule 0  . ondansetron (ZOFRAN) 8 MG tablet Take 1 tablet (8 mg total) by mouth every 8 (eight) hours as needed for nausea or vomiting. 20 tablet 0  . prochlorperazine (COMPAZINE) 10 MG tablet Take 1 tablet (10 mg total) by mouth every 6 (six) hours as needed for nausea. (Patient not taking: Reported on 05/03/2016) 20 tablet 0   No current facility-administered medications for this visit.     REVIEW OF SYSTEMS:   Constitutional: Denies fevers, chills or abnormal  night sweats Eyes: Denies blurriness of vision, double vision or watery eyes Ears, nose, mouth, throat, and face: Denies mucositis or sore throat Respiratory: Denies cough, dyspnea or wheezes Cardiovascular: Denies palpitation, chest discomfort or lower extremity swelling Gastrointestinal:  Denies nausea, heartburn or change in bowel habits Skin: Denies abnormal skin rashes Lymphatics: Denies new lymphadenopathy or easy bruising Neurological:Denies numbness, tingling or new weaknesses Behavioral/Psych: Mood is stable, no new changes  All other systems were reviewed with the patient and are negative.  PHYSICAL EXAMINATION: ECOG PERFORMANCE STATUS: 1 - Symptomatic but completely ambulatory  Vitals:   05/03/16 0926  BP: (!) 159/86  Pulse: 82  Resp: 17  Temp: 98.7 F (37.1 C)   Filed Weights   05/03/16 0926  Weight: 158 lb 1.6 oz (71.7 kg)  GENERAL:alert, no distress and comfortable SKIN: skin color, texture, turgor are normal, no rashes or significant lesions EYES: normal, conjunctiva are pink and non-injected, sclera clear OROPHARYNX:no exudate, normal lips, buccal mucosa, and tongue  NECK: supple, thyroid normal size, non-tender, without nodularity LYMPH:  no palpable lymphadenopathy in the cervical, axillary or inguinal LUNGS: clear to auscultation and percussion with normal breathing effort HEART: regular rate & rhythm and no murmurs without lower extremity edema ABDOMEN:abdomen soft, non-tender and normal bowel sounds Musculoskeletal:no cyanosis of digits and no clubbing  PSYCH: alert & oriented x 3 with fluent speech NEURO: no focal motor/sensory deficits  LABORATORY DATA:  I have reviewed the data as listed Lab Results  Component Value Date   WBC 4.2 05/02/2016   HGB 11.0 (L) 05/02/2016   HCT 33.6 (L) 05/02/2016   MCV 84.0 05/02/2016   PLT 209 05/02/2016    Recent Labs  07/08/15 1335 12/24/15 2055 02/20/16 2106  04/08/16 0630  04/18/16 1037  04/25/16 0944 05/02/16 1045  NA 138 137 140  < > 138  < > 138 141 140  K 3.5 3.7 3.6  < > 3.5  < > 3.8 3.5 3.1*  CL 97* 100* 99*  --  100*  --   --   --   --   CO2 30 31  --   < > 28  < > 29 29 34*  GLUCOSE 175* 85 80  < > 83  < > 117 102 91  BUN 15 8 10   < > 11  < > 12.8 10.4 14.7  CREATININE 1.24* 0.63 0.60  < > 0.60  < > 0.8 0.8 0.9  CALCIUM 9.4 9.4  --   < > 9.2  < > 9.5 8.8 8.3*  GFRNONAA 48* >60  --   --  >60  --   --   --   --   GFRAA 55* >60  --   --  >60  --   --   --   --   PROT 7.6  --   --   < >  --   < > 7.5 7.5 7.4  ALBUMIN 3.8  --   --   < >  --   < > 3.7 3.8 3.8  AST 25  --   --   < >  --   < > 25 26 25   ALT 29  --   --   < >  --   < > 32 33 32  ALKPHOS 64  --   --   < >  --   < > 75 79 88  BILITOT 0.6  --   --   < >  --   < > 0.62 0.57 0.43  < > = values in this interval not displayed.  ASSESSMENT & PLAN:  Cervical cancer ([place]) She tolerated treatment well so far without significant side-effects from treatment I will continue to see her weekly for toxicity review  Anemia due to antineoplastic chemotherapy This is likely due to recent treatment. The patient denies recent history of bleeding such as epistaxis, hematuria or hematochezia. She is asymptomatic from the anemia. I will observe for now.  She does not require transfusion now. I will continue the chemotherapy at current dose without dosage adjustment.  If the anemia gets progressive worse in the future, I might have to delay her treatment or adjust the chemotherapy dose.   Continuous tobacco abuse I spent some time counseling  the patient the importance of tobacco cessation. We discussed common strategies including nicotine patches, Tobacco Quit-line, and other nicotine replacement products to assist in hereffort to quit  she appears motivated to quit.   Methadone use ([place]) She is taking this for chronic back pain I will check serum vitamin D in her next visit I made it clear to the patient I will not  refill her Methodone She is getting it from [facility] and we are getting her established with local PCP  Essential hypertension She is noted to have mildly elevated blood pressure. She will continue blood pressure medication for now. We are in the process of scheduling her to see her primary care doctor   No orders of the defined types were placed in this encounter.   All questions were answered. The patient knows to call the clinic with any problems, questions or concerns. I spent 40 minutes counseling the patient face to face. The total time spent in the appointment was 60 minutes and more than 50% was on counseling.     Heath Lark, MD 05/05/2016 8:24 AM

## 2016-05-04 NOTE — Assessment & Plan Note (Signed)

## 2016-05-05 ENCOUNTER — Encounter: Payer: Self-pay | Admitting: Hematology and Oncology

## 2016-05-05 ENCOUNTER — Ambulatory Visit
Admission: RE | Admit: 2016-05-05 | Discharge: 2016-05-05 | Disposition: A | Discharge status: Home / Self Care | Payer: Medicaid Other | Source: Ambulatory Visit | Attending: Radiation Oncology | Admitting: Radiation Oncology

## 2016-05-05 NOTE — Assessment & Plan Note (Signed)
She is noted to have mildly elevated blood pressure. She will continue blood pressure medication for now. We are in the process of scheduling her to see her primary care doctor

## 2016-05-06 ENCOUNTER — Other Ambulatory Visit: Payer: Self-pay | Admitting: Radiation Oncology

## 2016-05-06 ENCOUNTER — Ambulatory Visit
Admission: RE | Admit: 2016-05-06 | Discharge: 2016-05-06 | Disposition: A | Discharge status: Home / Self Care | Payer: Medicaid Other | Source: Ambulatory Visit | Attending: Radiation Oncology | Admitting: Radiation Oncology

## 2016-05-06 ENCOUNTER — Other Ambulatory Visit: Payer: Self-pay | Admitting: Hematology and Oncology

## 2016-05-06 DIAGNOSIS — C539 Malignant neoplasm of cervix uteri, unspecified: Secondary | ICD-10-CM

## 2016-05-09 ENCOUNTER — Ambulatory Visit
Admission: RE | Admit: 2016-05-09 | Discharge: 2016-05-09 | Disposition: A | Discharge status: Home / Self Care | Payer: Medicaid Other | Source: Ambulatory Visit | Attending: Radiation Oncology | Admitting: Radiation Oncology

## 2016-05-09 ENCOUNTER — Ambulatory Visit (HOSPITAL_BASED_OUTPATIENT_CLINIC_OR_DEPARTMENT_OTHER): Payer: Self-pay

## 2016-05-09 ENCOUNTER — Other Ambulatory Visit (HOSPITAL_BASED_OUTPATIENT_CLINIC_OR_DEPARTMENT_OTHER): Payer: Self-pay

## 2016-05-09 ENCOUNTER — Encounter: Payer: Self-pay | Admitting: Hematology and Oncology

## 2016-05-09 VITALS — BP 131/77 | HR 76 | Temp 99.2°F | Resp 17

## 2016-05-09 DIAGNOSIS — C539 Malignant neoplasm of cervix uteri, unspecified: Secondary | ICD-10-CM

## 2016-05-09 DIAGNOSIS — Z5111 Encounter for antineoplastic chemotherapy: Secondary | ICD-10-CM

## 2016-05-09 LAB — COMPREHENSIVE METABOLIC PANEL
ALBUMIN: 3.7 g/dL (ref 3.5–5.0)
ALT: 31 U/L (ref 0–55)
AST: 33 U/L (ref 5–34)
Alkaline Phosphatase: 84 U/L (ref 40–150)
Anion Gap: 12 mEq/L — ABNORMAL HIGH (ref 3–11)
BUN: 17.6 mg/dL (ref 7.0–26.0)
CHLORIDE: 96 meq/L — AB (ref 98–109)
CO2: 34 mEq/L — ABNORMAL HIGH (ref 22–29)
Calcium: 7.6 mg/dL — ABNORMAL LOW (ref 8.4–10.4)
Creatinine: 1.1 mg/dL (ref 0.6–1.1)
EGFR: 68 mL/min/{1.73_m2} — ABNORMAL LOW (ref >90)
GLUCOSE: 99 mg/dL (ref 70–140)
POTASSIUM: 2.9 meq/L — AB (ref 3.5–5.1)
SODIUM: 141 meq/L (ref 136–145)
Total Bilirubin: 0.51 mg/dL (ref 0.20–1.20)
Total Protein: 7.2 g/dL (ref 6.4–8.3)

## 2016-05-09 LAB — CBC WITH DIFFERENTIAL/PLATELET
BASO%: 0.5 % (ref 0.0–2.0)
BASOS ABS: 0 10*3/uL (ref 0.0–0.1)
EOS%: 0.6 % (ref 0.0–7.0)
Eosinophils Absolute: 0 10*3/uL (ref 0.0–0.5)
HCT: 33.2 % — ABNORMAL LOW (ref 34.8–46.6)
HEMOGLOBIN: 11.1 g/dL — AB (ref 11.6–15.9)
LYMPH%: 14.9 % (ref 14.0–49.7)
MCH: 27.8 pg (ref 25.1–34.0)
MCHC: 33.4 g/dL (ref 31.5–36.0)
MCV: 83 fL (ref 79.5–101.0)
MONO#: 0.3 10*3/uL (ref 0.1–0.9)
MONO%: 10.7 % (ref 0.0–14.0)
NEUT#: 2.1 10*3/uL (ref 1.5–6.5)
NEUT%: 73.3 % (ref 38.4–76.8)
Platelets: 171 10*3/uL (ref 145–400)
RBC: 4 10*6/uL (ref 3.70–5.45)
RDW: 12.9 % (ref 11.2–14.5)
WBC: 2.9 10*3/uL — ABNORMAL LOW (ref 3.9–10.3)
lymph#: 0.4 10*3/uL — ABNORMAL LOW (ref 0.9–3.3)

## 2016-05-09 LAB — MAGNESIUM: Magnesium: 1.4 mg/dl — CL (ref 1.5–2.5)

## 2016-05-09 MED ORDER — POTASSIUM CHLORIDE 2 MEQ/ML IV SOLN
Freq: Once | INTRAVENOUS | Status: AC
  Administered 2016-05-09: 13:00:00 via INTRAVENOUS
  Filled 2016-05-09: qty 20

## 2016-05-09 MED ORDER — SODIUM CHLORIDE 0.9 % IV SOLN
Freq: Once | INTRAVENOUS | Status: AC
  Administered 2016-05-09: 12:00:00 via INTRAVENOUS

## 2016-05-09 MED ORDER — POTASSIUM CHLORIDE CRYS ER 20 MEQ PO TBCR: 20.0000 meq | EXTENDED_RELEASE_TABLET | Freq: Two times a day (BID) | ORAL | 0 refills | Status: DC

## 2016-05-09 MED ORDER — MAGNESIUM OXIDE -MG SUPPLEMENT 400 MG PO CAPS: 400.0000 mg | ORAL_CAPSULE | Freq: Every day | ORAL | 0 refills | Status: DC

## 2016-05-09 MED ORDER — FOSAPREPITANT DIMEGLUMINE INJECTION 150 MG
Freq: Once | INTRAVENOUS | Status: AC
  Administered 2016-05-09: 15:00:00 via INTRAVENOUS
  Filled 2016-05-09: qty 5

## 2016-05-09 MED ORDER — LORAZEPAM 1 MG PO TABS
ORAL_TABLET | ORAL | Status: AC
  Filled 2016-05-09: qty 1

## 2016-05-09 MED ORDER — ONDANSETRON HCL 4 MG/2ML IJ SOLN
8.0000 mg | Freq: Once | INTRAMUSCULAR | Status: AC
  Administered 2016-05-09: 8 mg via INTRAVENOUS

## 2016-05-09 MED ORDER — CISPLATIN CHEMO INJECTION 100MG/100ML
40.0000 mg/m2 | Freq: Once | INTRAVENOUS | Status: AC
  Administered 2016-05-09: 70 mg via INTRAVENOUS
  Filled 2016-05-09: qty 70

## 2016-05-09 MED ORDER — ONDANSETRON HCL 4 MG/2ML IJ SOLN
INTRAMUSCULAR | Status: AC
  Filled 2016-05-09: qty 4

## 2016-05-09 MED ORDER — LORAZEPAM 1 MG PO TABS
1.0000 mg | ORAL_TABLET | Freq: Once | ORAL | Status: AC | PRN
Start: 2016-05-09 — End: 2016-05-09
  Administered 2016-05-09: 1 mg via ORAL

## 2016-05-09 MED FILL — POTASSIUM CL ER 20 MEQ TAB: 20 | 7 days supply | Qty: 14 | Fill #0

## 2016-05-09 NOTE — Progress Notes (Signed)
Patient needed number again to [employer]. Gave patient a referral form with the number on it and also a form to [facility] along with the number to receive their application. Patient has my card for any additional financial questions or concerns.

## 2016-05-09 NOTE — Patient Instructions (Signed)
[facility] Discharge Instructions for Patients Receiving Chemotherapy  Today you received the following chemotherapy agents Cisplatin  To help prevent nausea and vomiting after your treatment, we encourage you to take your nausea medication as needed   If you develop nausea and vomiting that is not controlled by your nausea medication, call the clinic.   BELOW ARE SYMPTOMS THAT SHOULD BE REPORTED IMMEDIATELY:  *FEVER GREATER THAN 100.5 F  *CHILLS WITH OR WITHOUT FEVER  NAUSEA AND VOMITING THAT IS NOT CONTROLLED WITH YOUR NAUSEA MEDICATION  *UNUSUAL SHORTNESS OF BREATH  *UNUSUAL BRUISING OR BLEEDING  TENDERNESS IN MOUTH AND THROAT WITH OR WITHOUT PRESENCE OF ULCERS  *URINARY PROBLEMS  *BOWEL PROBLEMS  UNUSUAL RASH Items with * indicate a potential emergency and should be followed up as soon as possible.  Feel free to call the clinic you have any questions or concerns. The clinic phone number is [phone].  Please show the CHEMO ALERT CARD at check-in to the Emergency Department and triage nurse.   

## 2016-05-09 NOTE — Progress Notes (Signed)
Mg 1.4 and K 2.9 today. Dr. Alvy Bimler made aware and potassium po at home reordered and new magnesium po at home ordered.

## 2016-05-10 ENCOUNTER — Ambulatory Visit
Admission: RE | Admit: 2016-05-10 | Discharge: 2016-05-10 | Disposition: A | Discharge status: Home / Self Care | Payer: Self-pay | Source: Ambulatory Visit | Attending: Radiation Oncology | Admitting: Radiation Oncology

## 2016-05-10 ENCOUNTER — Ambulatory Visit
Admission: RE | Admit: 2016-05-10 | Discharge: 2016-05-10 | Disposition: A | Discharge status: Home / Self Care | Payer: Medicaid Other | Source: Ambulatory Visit | Attending: Radiation Oncology | Admitting: Radiation Oncology

## 2016-05-10 ENCOUNTER — Ambulatory Visit: Payer: Self-pay | Admitting: Hematology and Oncology

## 2016-05-10 ENCOUNTER — Encounter: Payer: Self-pay | Admitting: Radiation Oncology

## 2016-05-10 VITALS — BP 144/84 | HR 84 | Temp 98.2°F | Resp 20 | Wt 152.0 lb

## 2016-05-10 DIAGNOSIS — F418 Other specified anxiety disorders: Secondary | ICD-10-CM

## 2016-05-10 DIAGNOSIS — C539 Malignant neoplasm of cervix uteri, unspecified: Secondary | ICD-10-CM

## 2016-05-10 MED ORDER — ALPRAZOLAM 0.25 MG PO TABS: 0.2500 mg | ORAL_TABLET | Freq: Two times a day (BID) | ORAL | 0 refills | Status: DC | PRN

## 2016-05-10 NOTE — Progress Notes (Addendum)
weekly rad tx pelvis/cervical, 20/30 completed,  no bleeding, no discharge,  no dysuria, is having slight loose stool incontinence, started miralax yesterday,  started yesterday, appetite poor ,main c/o fingers cramping, up, potassium rx writen yesterday but patient hasn't picked up as yet, will pick up today,needs to be in [place] Thursday and Friday will miss these two days of radiation  Stated patient 10:32 AM  BP (!) 145/77 (BP Location: Left Arm, Patient Position: Sitting, Cuff Size: Normal)   Pulse 79   Temp 98.2 F (36.8 C) (Oral)   Resp 18   Wt 152 lb (68.9 kg)   BMI 28.72 kg/m   Sitting   Standing b/p BP (!) 144/84 (BP Location: Left Arm, Patient Position: Standing, Cuff Size: Normal)   Pulse 84   Temp 98.2 F (36.8 C) (Oral)   Resp 20   Wt 152 lb (68.9 kg)   BMI 28.72 kg/m  Temp 98.2 F (36.8 C) (Oral)   Resp 20   Wt 152 lb (68.9 kg)   BMI 28.72 kg/m  Wt Readings from Last 3 Encounters:  05/10/16 152 lb (68.9 kg)  05/03/16 159 lb 6.4 oz (72.3 kg)  05/03/16 158 lb 1.6 oz (71.7 kg)

## 2016-05-10 NOTE — Progress Notes (Signed)
  Radiation Oncology         [phone] ________________________________  Name: Valerie Kerr MRN: [id number]  Date: 05/10/2016  DOB: 04/23/1960  Weekly Radiation Therapy Management    ICD-9-CM ICD-10-CM   1. Malignant neoplasm of cervix, unspecified site ([place]) 180.9 C53.9   2. Situational anxiety 300.09 F41.8 ALPRAZolam (XANAX) 0.25 MG tablet     Current Dose: 36 Gy     Planned Dose:  54+ Gy  Narrative . . . . . . . . The patient presents for routine under treatment assessment.                                    Weekly rad tx pelvis/cervical, 20/30 completed. The patient denies vaginal bleeding, discharge, or dysuria. The patient's hands and left foot are cramping. She is having slight loose stool incontinence. She started miralax yesterday. The patient has a potassium RX writen yesterday, but the patient has not picked it up as of yet. She will pick it up today. The patient has a poor appetite. The patient has lost 7 lbs in the past week. The patient reports anxiety due to the passing of her brother and financial concerns.                                  Set-up films were reviewed.                                 The chart was checked. Physical Findings. . .  weight is 152 lb (68.9 kg). Her oral temperature is 98.2 F (36.8 C). Her blood pressure is 144/84 (abnormal) and her pulse is 84. Her respiration is 20.   The patient was nervous during the encounter. Lungs are clear to auscultation bilaterally. Heart has regular rate and rhythm. Abdomen soft, non-tender, normal bowel sounds. Impression . . . . . . . The patient is tolerating radiation. Plan . . . . . . . . . . . . Continue treatment as planned. The patient is leaving after her treatment tomorrow to attend her brother's funeral and will return on Sunday. The patient will return tomorrow for pelvic exam in preparation of her first HDR treatment next week. I will give the patient a refill of her  Xanax. ________________________________   [name], PhD, MD  This document serves as a record of services personally performed by [name], MD. It was created on his behalf by [name], a trained medical scribe. The creation of this record is based on the scribe's personal observations and the provider's statements to them. This document has been checked and approved by the attending provider.

## 2016-05-11 ENCOUNTER — Encounter: Payer: Self-pay | Admitting: Hematology and Oncology

## 2016-05-11 ENCOUNTER — Ambulatory Visit
Admission: RE | Admit: 2016-05-11 | Discharge: 2016-05-11 | Disposition: A | Discharge status: Home / Self Care | Payer: Medicaid Other | Source: Ambulatory Visit | Attending: Radiation Oncology | Admitting: Radiation Oncology

## 2016-05-11 ENCOUNTER — Inpatient Hospital Stay
Admission: RE | Admit: 2016-05-11 | Discharge: 2016-05-11 | Disposition: A | Discharge status: Home / Self Care | Payer: Self-pay | Source: Ambulatory Visit | Attending: Radiation Oncology | Admitting: Radiation Oncology

## 2016-05-11 ENCOUNTER — Ambulatory Visit (HOSPITAL_BASED_OUTPATIENT_CLINIC_OR_DEPARTMENT_OTHER): Payer: Self-pay | Admitting: Hematology and Oncology

## 2016-05-11 ENCOUNTER — Telehealth: Payer: Self-pay | Admitting: Hematology and Oncology

## 2016-05-11 ENCOUNTER — Telehealth: Payer: Self-pay

## 2016-05-11 DIAGNOSIS — E876 Hypokalemia: Secondary | ICD-10-CM

## 2016-05-11 DIAGNOSIS — D6481 Anemia due to antineoplastic chemotherapy: Secondary | ICD-10-CM

## 2016-05-11 DIAGNOSIS — C539 Malignant neoplasm of cervix uteri, unspecified: Secondary | ICD-10-CM

## 2016-05-11 DIAGNOSIS — D701 Agranulocytosis secondary to cancer chemotherapy: Secondary | ICD-10-CM

## 2016-05-11 DIAGNOSIS — T451X5A Adverse effect of antineoplastic and immunosuppressive drugs, initial encounter: Secondary | ICD-10-CM

## 2016-05-11 DIAGNOSIS — Z720 Tobacco use: Secondary | ICD-10-CM

## 2016-05-11 NOTE — Telephone Encounter (Signed)
Patient came to scheduling to have appointment rescheduled per she had to attend her brother's funeral on 2/16. Appointment reschedule per NG.

## 2016-05-11 NOTE — Telephone Encounter (Signed)
Patient called to say she forgot she had an appointment with Dr. Alvy Bimler yesterday. Her brother has died and and she is leaving today to go to in funeral in [place] and not coming back until this weekend. She is requesting an appt with Dr. Alvy Bimler today. Informed her that no appt's are available today. Instructed her to call scheduling and reschedule appointment.

## 2016-05-11 NOTE — Progress Notes (Signed)
  Radiation Oncology         [phone] ________________________________  Name: Valerie Kerr MRN: [id number]  Date: 05/11/2016  DOB: 09-08-1960  Weekly Radiation Therapy Management    ICD-9-CM ICD-10-CM   1. Malignant neoplasm of cervix, unspecified site ([place]) 180.9 C53.9      Current Dose: 37.8 Gy     Planned Dose:  54+ Gy  Narrative . . . . . . . . The patient presents for routine under treatment assessment.                                    Weekly rad tx pelvis/cervical, 21/30 completed. She returns today for pelvic exam.                                  Set-up films were reviewed.                                 The chart was checked. Physical Findings. . .  vitals were not taken for this visit.  Pelvic exam showed tumor mass is shrinking with treatment. Tumor protrudes into the upper part of the vaginal vault.  Impression . . . . . . . The patient is tolerating radiation reasonably well. She informed me that her brother recently passed away and she will need to attend the funeral later this week, and will be out of radiation therapy for 2 days. Plan . . . . . . . . . . . . The patient was originally scheduled to begin her first brachytherapy treatment on 05/17/16, but given the patient's unscheduled trip and her emotional situation at this time, we will postpone her first brachytherapy procedure until late February or early March. In the meantime the patient will continue to receive regular daily radiation treatments, and will tentatively receive chemotherapy next week when she returns from her trip. ________________________________   [name], PhD, MD  This document serves as a record of services personally performed by [name], MD. It was created on his behalf by Maryla Morrow, a trained medical scribe. The creation of this record is based on the scribe's personal observations and the provider's statements to them. This document has been checked and approved by  the attending provider.

## 2016-05-11 NOTE — Progress Notes (Signed)
Patient came in to get gas card from [facility].

## 2016-05-12 ENCOUNTER — Ambulatory Visit: Payer: Medicaid Other

## 2016-05-12 ENCOUNTER — Encounter: Payer: Self-pay | Admitting: Hematology and Oncology

## 2016-05-12 ENCOUNTER — Ambulatory Visit: Payer: Medicaid Other | Admitting: Radiation Oncology

## 2016-05-12 DIAGNOSIS — E876 Hypokalemia: Secondary | ICD-10-CM | POA: Insufficient documentation

## 2016-05-12 DIAGNOSIS — T451X5A Adverse effect of antineoplastic and immunosuppressive drugs, initial encounter: Secondary | ICD-10-CM

## 2016-05-12 DIAGNOSIS — D701 Agranulocytosis secondary to cancer chemotherapy: Secondary | ICD-10-CM | POA: Insufficient documentation

## 2016-05-12 NOTE — Assessment & Plan Note (Signed)
She tolerated treatment well so far without significant side-effects from treatment I will continue to see her weekly for toxicity review I reinforced the importance of her keeping her appointment

## 2016-05-12 NOTE — Assessment & Plan Note (Signed)
Likely due to recent chemotherapy and hypomagnesemia. I have ordered replacement therapy

## 2016-05-12 NOTE — Progress Notes (Signed)
[facility] OFFICE PROGRESS NOTE  Patient Care Team: No Pcp Per Patient as PCP - General (General Practice)  SUMMARY OF ONCOLOGIC HISTORY:   Cervical cancer ([place])   02/20/2016 Initial Diagnosis    She presented to the ED with vaginal bleeding and abnormal pelvic mass. She was seen in the office by Dr Baron Sane 03/01/16 who performed cervical biopsies of a friable mass which revealed poorly differentiated squamous cell carcinoma.      03/01/2016 Pathology Results    1. Cervix, biopsy, mass INFILTRATIVE SQUAMOUS CELL CARCINOMA 2. Cervix, biopsy INVASIVE POORLY DIFFERENTIATED SQUAMOUS CELL CARCINOMA      03/03/2016 Imaging    Pelvic US was Suboptimal study due to bowel gas. Difficult visualization of the uterus and adnexal structures appear probable multiple fibroids within the uterus. Endometrium thickened at 14 mm. In the setting of post-menopausal bleeding, endometrial sampling is indicated to exclude carcinoma. If results are benign, sonohysterogram should be considered for focal lesion work-up.       03/23/2016 Miscellaneous    Examination by Dr. [name] in the office concluded the cervix is completely replaced by a 6cm exophytic friable mass which is replacing the cervix and encroaching the upper vaginal fornices and the parametrium bilaterally.      04/04/2016 PET scan    5.5 cm hypermetabolic cervical mass, consistent with known primary cervical carcinoma. No definite local or distant metastatic disease. Sub-cm hypermetabolic left axillary lymph nodes, likely reactive in etiology. Recommend continued attention on follow-up imaging.       04/08/2016 Procedure    Dr. Enrique Sack performed Multiple extraction of tooth numbers 3, 9, 21, 30, and 31 .  2 Quadrants of alveoloplasty 3. Gross debridement of remaining dentition         04/12/2016 -  Chemotherapy    The patient had weekly concurrent chemotherapy with radiation       04/14/2016 -  Radiation Therapy    The  patient had concurrent radiation treatment       INTERVAL HISTORY: Please see below for problem oriented charting. She has missed her appointment yesterday. She was very distressed and call multiple times to reschedule. Her brother had recently passed away and she has to go to the funeral and hence was not able to come later in the week. She tolerated chemotherapy well. Denies recent mucositis, nausea vomiting. No worsening bone pain or neuropathy. She denies recent diarrhea. She has mild myalgias. She has not picked up her prescription of oral magnesium and potassium replacement therapy  REVIEW OF SYSTEMS:   Constitutional: Denies fevers, chills or abnormal weight loss Eyes: Denies blurriness of vision Ears, nose, mouth, throat, and face: Denies mucositis or sore throat Respiratory: Denies cough, dyspnea or wheezes Cardiovascular: Denies palpitation, chest discomfort or lower extremity swelling Gastrointestinal:  Denies nausea, heartburn or change in bowel habits Skin: Denies abnormal skin rashes Lymphatics: Denies new lymphadenopathy or easy bruising Neurological:Denies numbness, tingling or new weaknesses Behavioral/Psych: Mood is stable, no new changes  All other systems were reviewed with the patient and are negative.  I have reviewed the past medical history, past surgical history, social history and family history with the patient and they are unchanged from previous note.  ALLERGIES:  is allergic to codeine; fentanyl; and tramadol.  MEDICATIONS:  Current Outpatient Prescriptions  Medication Sig Dispense Refill  . acetaminophen (TYLENOL) 500 MG tablet Take 500 mg by mouth every 6 (six) hours as needed for mild pain.     Marland Kitchen ALPRAZolam (XANAX) 0.25  MG tablet Take 1 tablet (0.25 mg total) by mouth 2 (two) times daily as needed for anxiety. 30 tablet 0  . emollient (BIAFINE) cream Apply topically 2 (two) times daily.    . fluticasone (FLONASE) 50 MCG/ACT nasal spray Place 1  spray into both nostrils daily.    Marland Kitchen ibuprofen (ADVIL,MOTRIN) 600 MG tablet Take 1 tablet by mouth every 6 hours for 24 hours and then as needed for moderate to severe pain. 30 tablet 0  . lisinopril-hydrochlorothiazide (PRINZIDE) 10-12.5 MG tablet Take 1 tablet by mouth daily. 30 tablet 1  . Magnesium Oxide -Mg Supplement 400 MG CAPS Take 400 mg by mouth daily. Hold if diarrhea. 30 capsule 0  . methadone (DOLOPHINE) 10 MG/5ML solution Take 55 mg by mouth daily.    Marland Kitchen omeprazole (PRILOSEC) 20 MG capsule Take 1 capsule (20 mg total) by mouth daily. 30 capsule 0  . ondansetron (ZOFRAN) 8 MG tablet Take 1 tablet (8 mg total) by mouth every 8 (eight) hours as needed for nausea or vomiting. 20 tablet 0  . potassium chloride SA (K-DUR,KLOR-CON) 20 MEQ tablet Take 1 tablet (20 mEq total) by mouth 2 (two) times daily. 14 tablet 0  . prochlorperazine (COMPAZINE) 10 MG tablet Take 1 tablet (10 mg total) by mouth every 6 (six) hours as needed for nausea. 20 tablet 0   No current facility-administered medications for this visit.     PHYSICAL EXAMINATION: ECOG PERFORMANCE STATUS: 1 - Symptomatic but completely ambulatory GENERAL:alert, no distress and comfortable SKIN: skin color, texture, turgor are normal, no rashes or significant lesions EYES: normal, Conjunctiva are pink and non-injected, sclera clear OROPHARYNX:no exudate, no erythema and lips, buccal mucosa, and tongue normal  NECK: supple, thyroid normal size, non-tender, without nodularity LYMPH:  no palpable lymphadenopathy in the cervical, axillary or inguinal LUNGS: clear to auscultation and percussion with normal breathing effort HEART: regular rate & rhythm and no murmurs and no lower extremity edema ABDOMEN:abdomen soft, non-tender and normal bowel sounds Musculoskeletal:no cyanosis of digits and no clubbing  NEURO: alert & oriented x 3 with fluent speech, no focal motor/sensory deficits  LABORATORY DATA:  I have reviewed the data as  listed    Component Value Date/Time   NA 141 05/09/2016 1054   K 2.9 (LL) 05/09/2016 1054   CL 100 (L) 04/08/2016 0630   CO2 34 (H) 05/09/2016 1054   GLUCOSE 99 05/09/2016 1054   BUN 17.6 05/09/2016 1054   CREATININE 1.1 05/09/2016 1054   CALCIUM 7.6 (L) 05/09/2016 1054   PROT 7.2 05/09/2016 1054   ALBUMIN 3.7 05/09/2016 1054   AST 33 05/09/2016 1054   ALT 31 05/09/2016 1054   ALKPHOS 84 05/09/2016 1054   BILITOT 0.51 05/09/2016 1054   GFRNONAA >60 04/08/2016 0630   GFRAA >60 04/08/2016 0630    No results found for: SPEP, UPEP  Lab Results  Component Value Date   WBC 2.9 (L) 05/09/2016   NEUTROABS 2.1 05/09/2016   HGB 11.1 (L) 05/09/2016   HCT 33.2 (L) 05/09/2016   MCV 83.0 05/09/2016   PLT 171 05/09/2016      Chemistry      Component Value Date/Time   NA 141 05/09/2016 1054   K 2.9 (LL) 05/09/2016 1054   CL 100 (L) 04/08/2016 0630   CO2 34 (H) 05/09/2016 1054   BUN 17.6 05/09/2016 1054   CREATININE 1.1 05/09/2016 1054      Component Value Date/Time   CALCIUM 7.6 (L) 05/09/2016 1054  ALKPHOS 84 05/09/2016 1054   AST 33 05/09/2016 1054   ALT 31 05/09/2016 1054   BILITOT 0.51 05/09/2016 1054      ASSESSMENT & PLAN:  Cervical cancer ([place]) She tolerated treatment well so far without significant side-effects from treatment I will continue to see her weekly for toxicity review I reinforced the importance of her keeping her appointment  Leukopenia due to antineoplastic chemotherapy [facility]) This is likely due to recent treatment. The patient denies recent history of fevers, cough, chills, diarrhea or dysuria. She is asymptomatic from the leukopenia. I will observe for now.  I will continue the chemotherapy at current dose without dosage adjustment.  If the leukopenia gets progressive worse in the future, I might have to delay her treatment or adjust the chemotherapy dose.    Anemia due to antineoplastic chemotherapy This is likely due to recent treatment. The  patient denies recent history of bleeding such as epistaxis, hematuria or hematochezia. She is asymptomatic from the anemia. I will observe for now.  She does not require transfusion now. I will continue the chemotherapy at current dose without dosage adjustment.  If the anemia gets progressive worse in the future, I might have to delay her treatment or adjust the chemotherapy dose.   Hypomagnesemia This is related to recent cisplatin treatment.  I have ordered replacement therapy.  I reinforce compliance with medication  Hypokalemia Likely due to recent chemotherapy and hypomagnesemia. I have ordered replacement therapy  Continuous tobacco abuse I spent some time counseling the patient the importance of tobacco cessation. We discussed common strategies including nicotine patches, Tobacco Quit-line, and other nicotine replacement products to assist in hereffort to quit  she appears motivated to quit.    No orders of the defined types were placed in this encounter.  All questions were answered. The patient knows to call the clinic with any problems, questions or concerns. No barriers to learning was detected. I spent 25 minutes counseling the patient face to face. The total time spent in the appointment was 40 minutes and more than 50% was on counseling and review of test results     Heath Lark, MD 05/12/2016 8:04 AM

## 2016-05-12 NOTE — Assessment & Plan Note (Signed)
This is related to recent cisplatin treatment.  I have ordered replacement therapy.  I reinforce compliance with medication

## 2016-05-12 NOTE — Assessment & Plan Note (Signed)
I spent some time counseling the patient the importance of tobacco cessation. We discussed common strategies including nicotine patches, Tobacco Quit-line, and other nicotine replacement products to assist in hereffort to quit  she appears motivated to quit.  

## 2016-05-12 NOTE — Assessment & Plan Note (Signed)

## 2016-05-12 NOTE — Assessment & Plan Note (Signed)
This is likely due to recent treatment. The patient denies recent history of fevers, cough, chills, diarrhea or dysuria. She is asymptomatic from the leukopenia. I will observe for now.  I will continue the chemotherapy at current dose without dosage adjustment.  If the leukopenia gets progressive worse in the future, I might have to delay her treatment or adjust the chemotherapy dose.   

## 2016-05-13 ENCOUNTER — Ambulatory Visit: Payer: Self-pay | Admitting: Hematology and Oncology

## 2016-05-13 ENCOUNTER — Ambulatory Visit: Payer: Medicaid Other

## 2016-05-16 ENCOUNTER — Telehealth: Payer: Self-pay | Admitting: *Deleted

## 2016-05-16 ENCOUNTER — Ambulatory Visit: Payer: Self-pay | Admitting: Nutrition

## 2016-05-16 ENCOUNTER — Ambulatory Visit
Admission: RE | Admit: 2016-05-16 | Discharge: 2016-05-16 | Disposition: A | Discharge status: Home / Self Care | Payer: Medicaid Other | Source: Ambulatory Visit | Attending: Radiation Oncology | Admitting: Radiation Oncology

## 2016-05-16 ENCOUNTER — Ambulatory Visit (HOSPITAL_BASED_OUTPATIENT_CLINIC_OR_DEPARTMENT_OTHER): Payer: Self-pay

## 2016-05-16 ENCOUNTER — Other Ambulatory Visit: Payer: Self-pay | Admitting: Hematology and Oncology

## 2016-05-16 ENCOUNTER — Other Ambulatory Visit (HOSPITAL_BASED_OUTPATIENT_CLINIC_OR_DEPARTMENT_OTHER): Payer: Self-pay

## 2016-05-16 DIAGNOSIS — C539 Malignant neoplasm of cervix uteri, unspecified: Secondary | ICD-10-CM

## 2016-05-16 LAB — CBC WITH DIFFERENTIAL/PLATELET
BASO%: 0 % (ref 0.0–2.0)
BASOS ABS: 0 10*3/uL (ref 0.0–0.1)
EOS%: 0 % (ref 0.0–7.0)
Eosinophils Absolute: 0 10*3/uL (ref 0.0–0.5)
HEMATOCRIT: 31.9 % — AB (ref 34.8–46.6)
HGB: 10.7 g/dL — ABNORMAL LOW (ref 11.6–15.9)
LYMPH#: 0.4 10*3/uL — AB (ref 0.9–3.3)
LYMPH%: 16.5 % (ref 14.0–49.7)
MCH: 27.4 pg (ref 25.1–34.0)
MCHC: 33.5 g/dL (ref 31.5–36.0)
MCV: 81.6 fL (ref 79.5–101.0)
MONO#: 0.2 10*3/uL (ref 0.1–0.9)
MONO%: 9.5 % (ref 0.0–14.0)
NEUT#: 1.7 10*3/uL (ref 1.5–6.5)
NEUT%: 74 % (ref 38.4–76.8)
Platelets: 79 10*3/uL — ABNORMAL LOW (ref 145–400)
RBC: 3.91 10*6/uL (ref 3.70–5.45)
RDW: 13.1 % (ref 11.2–14.5)
WBC: 2.3 10*3/uL — ABNORMAL LOW (ref 3.9–10.3)

## 2016-05-16 LAB — COMPREHENSIVE METABOLIC PANEL
ALT: 26 U/L (ref 0–55)
AST: 28 U/L (ref 5–34)
Albumin: 3.8 g/dL (ref 3.5–5.0)
Alkaline Phosphatase: 86 U/L (ref 40–150)
Anion Gap: 12 mEq/L — ABNORMAL HIGH (ref 3–11)
BUN: 19.5 mg/dL (ref 7.0–26.0)
CALCIUM: 7.3 mg/dL — AB (ref 8.4–10.4)
CHLORIDE: 100 meq/L (ref 98–109)
CO2: 29 mEq/L (ref 22–29)
Creatinine: 1.1 mg/dL (ref 0.6–1.1)
EGFR: 66 mL/min/{1.73_m2} — ABNORMAL LOW (ref >90)
Glucose: 98 mg/dl (ref 70–140)
POTASSIUM: 3.4 meq/L — AB (ref 3.5–5.1)
Sodium: 141 mEq/L (ref 136–145)
Total Bilirubin: 0.47 mg/dL (ref 0.20–1.20)
Total Protein: 7.4 g/dL (ref 6.4–8.3)

## 2016-05-16 LAB — MAGNESIUM: Magnesium: 1.1 mg/dl — CL (ref 1.5–2.5)

## 2016-05-16 MED ORDER — SODIUM CHLORIDE 0.9 % IV SOLN
4.0000 g | Freq: Once | INTRAVENOUS | Status: AC
  Administered 2016-05-16: 4 g via INTRAVENOUS
  Filled 2016-05-16: qty 8

## 2016-05-16 NOTE — Progress Notes (Signed)
Patient was identified to be at risk for malnutrition on the MST secondary to poor appetite and weight loss.  Patient was diagnosed with cervical cancer in November 2017.  Past medical history includes hypertension, anxiety, and tobacco.  Medications include Xanax, magnesium oxide, Prilosec, Zofran, K Dur, and Compazine.  Labs include magnesium 1.4, potassium 2.9.  Height: 5 feet 1 inch. Weight: 152 pounds February 13. Usual body weight: 160 pounds. BMI: 22.72  Patient reports she has decreased appetite and is eating small amounts. She states she has to force herself to eat. She has had problems with constipation.  However, this is resolving MiraLAX. Patient verbalizes concern with weight loss.  Nutrition diagnosis:  Unintended weight loss related to decreased appetite as evidenced by 8 pound weight loss.  Intervention: Educated patient to consume small frequent, high-calorie, high-protein meals and snacks. Provided fact sheets on increasing calories and protein, and high-protein foods. Recommended patient try oral nutrition supplements.  Provided coupons and recommendations. Educated patient on strategies for improving appetite and provided fact sheet. Questions were answered.  Teach back method used.  Contact information was given.  Monitoring, evaluation, goals:  Patient will tolerate increased calories and protein to minimize further weight loss.  Next visit: To be scheduled as needed.  **Disclaimer: This note was dictated with voice recognition software. Similar sounding words can inadvertently be transcribed and this note may contain transcription errors which may not have been corrected upon publication of note.**

## 2016-05-16 NOTE — Telephone Encounter (Signed)
Per staff message from MD I have scheduled appts. Chemo RN to give schedule

## 2016-05-16 NOTE — Patient Instructions (Signed)
Hypomagnesemia Introduction Hypomagnesemia is a condition in which the level of magnesium in the blood is low. Magnesium is a mineral that is found in many foods. It is used in many different processes in the body. Hypomagnesemia can affect every organ in the body. It can cause life-threatening problems. What are the causes? Causes of hypomagnesemia include:  Not getting enough magnesium in your diet.  Malnutrition.  Problems with absorbing magnesium from the intestines.  Dehydration.  Alcohol abuse.  Vomiting.  Severe diarrhea.  Some medicines, including medicines that make you urinate more.  Certain diseases, such as kidney disease, diabetes, and overactive thyroid. What are the signs or symptoms?  Involuntary shaking or trembling of a body part (tremor).  Confusion.  Muscle weakness.  Sensitivity to light, sound, and touch.  Psychiatric issues, such as depression, irritability, or psychosis.  Sudden tightening of muscles (muscle spasms).  Tingling in the arms and legs.  A feeling of fluttering of the heart. These symptoms are more severe if magnesium levels drop suddenly. How is this diagnosed? To make a diagnosis, your health care provider will do a physical exam and order blood and urine tests. How is this treated? Treatment will depend on the cause and the severity of your condition. It may involve:  A magnesium supplement. This can be taken in pill form. It can also be given through an IV tube. This is usually done if the condition is severe.  Changes to your diet. You may be directed to eat foods that have a lot of magnesium, such as green leafy vegetables, peas, beans, and nuts.  Eliminating alcohol from your diet. Follow these instructions at home:  Include foods with magnesium in your diet. Foods that are rich in magnesium include green vegetables, beans, nuts and seeds, and whole grains.  Take medicines only as directed by your health care  provider.  Take magnesium supplements if your health care provider instructs you to do that. Take them as directed.  Have your magnesium levels monitored as directed by your health care provider.  When you are active, drink fluids that contain electrolytes.  Keep all follow-up visits as directed by your health care provider. This is important. Contact a health care provider if:  You get worse instead of better.  Your symptoms return. Get help right away if:  Your symptoms are severe. This information is not intended to replace advice given to you by your health care provider. Make sure you discuss any questions you have with your health care provider. Document Released: 12/08/2004 Document Revised: 08/20/2015 Document Reviewed: 10/28/2013  2017 Elsevier

## 2016-05-17 ENCOUNTER — Ambulatory Visit: Payer: Medicaid Other | Admitting: Radiation Oncology

## 2016-05-17 ENCOUNTER — Telehealth: Payer: Self-pay | Admitting: Hematology and Oncology

## 2016-05-17 ENCOUNTER — Encounter: Payer: Self-pay | Admitting: Hematology and Oncology

## 2016-05-17 ENCOUNTER — Ambulatory Visit: Payer: Medicaid Other

## 2016-05-17 ENCOUNTER — Ambulatory Visit (HOSPITAL_COMMUNITY): Payer: Self-pay

## 2016-05-17 ENCOUNTER — Encounter: Payer: Self-pay | Admitting: Radiation Oncology

## 2016-05-17 ENCOUNTER — Ambulatory Visit
Admission: RE | Admit: 2016-05-17 | Discharge: 2016-05-17 | Disposition: A | Discharge status: Home / Self Care | Payer: Self-pay | Source: Ambulatory Visit | Attending: Radiation Oncology | Admitting: Radiation Oncology

## 2016-05-17 ENCOUNTER — Ambulatory Visit (HOSPITAL_BASED_OUTPATIENT_CLINIC_OR_DEPARTMENT_OTHER): Payer: Self-pay | Admitting: Hematology and Oncology

## 2016-05-17 ENCOUNTER — Ambulatory Visit
Admission: RE | Admit: 2016-05-17 | Discharge: 2016-05-17 | Disposition: A | Discharge status: Home / Self Care | Payer: Medicaid Other | Source: Ambulatory Visit | Attending: Radiation Oncology | Admitting: Radiation Oncology

## 2016-05-17 VITALS — BP 151/85 | HR 87 | Temp 98.3°F | Ht 61.0 in | Wt 149.0 lb

## 2016-05-17 DIAGNOSIS — E876 Hypokalemia: Secondary | ICD-10-CM

## 2016-05-17 DIAGNOSIS — C539 Malignant neoplasm of cervix uteri, unspecified: Secondary | ICD-10-CM | POA: Insufficient documentation

## 2016-05-17 DIAGNOSIS — Z720 Tobacco use: Secondary | ICD-10-CM

## 2016-05-17 DIAGNOSIS — D6481 Anemia due to antineoplastic chemotherapy: Secondary | ICD-10-CM

## 2016-05-17 DIAGNOSIS — D701 Agranulocytosis secondary to cancer chemotherapy: Secondary | ICD-10-CM

## 2016-05-17 DIAGNOSIS — R112 Nausea with vomiting, unspecified: Secondary | ICD-10-CM | POA: Insufficient documentation

## 2016-05-17 DIAGNOSIS — T451X5A Adverse effect of antineoplastic and immunosuppressive drugs, initial encounter: Secondary | ICD-10-CM

## 2016-05-17 MED ORDER — LORAZEPAM 0.5 MG PO TABS
0.5000 mg | ORAL_TABLET | Freq: Once | ORAL | Status: AC
  Administered 2016-05-17: 0.5 mg via SUBLINGUAL
  Filled 2016-05-17: qty 1

## 2016-05-17 MED ORDER — FLUTICASONE PROPIONATE 50 MCG/ACT NA SUSP: 1.0000 | Freq: Every day | NASAL | 1 refills | Status: DC

## 2016-05-17 MED FILL — FLUTICASONE PROP 50 MCG SPR: 50 | 30 days supply | Qty: 16 | Fill #0

## 2016-05-17 NOTE — Assessment & Plan Note (Signed)
This is due to hypomagnesemia related to potassium wasting from chemotherapy.  She will continue potassium-rich diet

## 2016-05-17 NOTE — Assessment & Plan Note (Signed)
She had recent nausea and vomiting likely due to side effects of treatment. She is noncompliant taking anti-emetics. She states that antiemetics make her sick. She denies constipation. I suspect the nausea vomiting is exacerbated by recent electrolyte imbalance. I reinforce importance for her to take antiemetics as needed for nausea.

## 2016-05-17 NOTE — Progress Notes (Signed)
  Radiation Oncology         [phone] ________________________________  Name: Valerie Kerr MRN: [id number]  Date: 05/17/2016  DOB: [birth year]  Weekly Radiation Therapy Management    ICD-9-CM ICD-10-CM   1. Malignant neoplasm of cervix, unspecified site (HCC) 180.9 C53.9 LORazepam (ATIVAN) tablet 0.5 mg     Current Dose: 41.4 Gy     Planned Dose:  54+ Gy  Narrative . . . . . . . . The patient presents for routine under treatment assessment.                                    The patient completed 23 fractions to her pelvis. She did not have treatment on 2/15 and 2/16 due to being out for her brother's funeral in [place]. She denies having pain other than occasional stomach cramps. She reports vomiting once over the weekend and before seeing her in the clinic. She has nausea. She reports having a poor appetite and is "barely eating small amounts."  She has lost 10lbs since 05/03/16. She saw the dietitian yesterday. She denies having diarrhea. She denies having any bladder issues or vaginal bleeding.  She reports having fatigue. She did not have chemotherapy this week due to a low magnesium level.                                  Set-up films were reviewed.                                 The chart was checked. Physical Findings. . .  height is 5\' 1"  (1.549 m) and weight is 149 lb (67.6 kg). Her oral temperature is 98.3 F (36.8 C). Her blood pressure is 151/85 (abnormal) and her pulse is 87. Her oxygen saturation is 100%.  Lungs are clear to auscultation bilaterally. Heart has regular rate and rhythm. Abdomen soft, non-tender, normal bowel sounds. Impression . . . . . . . The patient has emesis, nausea, loss of appetite, and abdominal cramps.   Plan . . . . . . . . . . . . The patient was originally scheduled to begin her first brachytherapy treatment on 05/17/16, but given the patient's unscheduled trip and her emotional situation at this time, we postponed her first brachytherapy  procedure until 05/26/16. In the meantime the patient will continue to receive regular daily radiation treatments. The patient was given sublingual Ativan in the clinic for her nausea and that appears to be working. ________________________________   [name], PhD, MD  This document serves as a record of services personally performed by [name], MD. It was created on his behalf by [name], a trained medical scribe. The creation of this record is based on the scribe's personal observations and the provider's statements to them. This document has been checked and approved by the attending provider.

## 2016-05-17 NOTE — Assessment & Plan Note (Signed)
She has started to experience significant side effects from treatment. She had recent severe hypo-magnesium anemia, hypokalemia and pancytopenia. We did not proceed with chemotherapy yesterday. I recommend we focus on supportive care. She has received at least 5 doses of chemotherapy. If she continues to have problems with uncontrolled side effects, I will stop all chemotherapy and recommend we focus on radiation treatments.

## 2016-05-17 NOTE — Assessment & Plan Note (Signed)
This is likely due to recent treatment. The patient denies recent history of bleeding such as epistaxis, hematuria or hematochezia. She is asymptomatic from the anemia. I will observe for now.   

## 2016-05-17 NOTE — Progress Notes (Signed)
[place] OFFICE PROGRESS NOTE  Patient Care Team: No Pcp Per Patient as PCP - General (General Practice)  SUMMARY OF ONCOLOGIC HISTORY:   Cervical cancer ([place])   02/20/2016 Initial Diagnosis    She presented to the ED with vaginal bleeding and abnormal pelvic mass. She was seen in the office by Dr Baron Sane 03/01/16 who performed cervical biopsies of a friable mass which revealed poorly differentiated squamous cell carcinoma.      03/01/2016 Pathology Results    1. Cervix, biopsy, mass INFILTRATIVE SQUAMOUS CELL CARCINOMA 2. Cervix, biopsy INVASIVE POORLY DIFFERENTIATED SQUAMOUS CELL CARCINOMA      03/03/2016 Imaging    Pelvic US was Suboptimal study due to bowel gas. Difficult visualization of the uterus and adnexal structures appear probable multiple fibroids within the uterus. Endometrium thickened at 14 mm. In the setting of post-menopausal bleeding, endometrial sampling is indicated to exclude carcinoma. If results are benign, sonohysterogram should be considered for focal lesion work-up.       03/23/2016 Miscellaneous    Examination by Dr. [name] in the office concluded the cervix is completely replaced by a 6cm exophytic friable mass which is replacing the cervix and encroaching the upper vaginal fornices and the parametrium bilaterally.      04/04/2016 PET scan    5.5 cm hypermetabolic cervical mass, consistent with known primary cervical carcinoma. No definite local or distant metastatic disease. Sub-cm hypermetabolic left axillary lymph nodes, likely reactive in etiology. Recommend continued attention on follow-up imaging.       04/08/2016 Procedure    Dr. Enrique Sack performed Multiple extraction of tooth numbers 3, 9, 21, 30, and 31 .  2 Quadrants of alveoloplasty 3. Gross debridement of remaining dentition         04/12/2016 -  Chemotherapy    The patient had weekly concurrent chemotherapy with radiation       04/14/2016 -  Radiation Therapy    The  patient had concurrent radiation treatment      05/16/2016 Adverse Reaction    Week 6 of chemotherapy is placed on hold due to pancytopenia.       INTERVAL HISTORY: Please see below for problem oriented charting. She is seen in her weekly visit. Cycle 6 is placed on hold due to pancytopenia. She is compliant taking magnesium and potassium replacement therapy. She complained of recent nausea and vomiting, poorly controlled. She is not taking anti-emetics because she perceives to anti-emetics is making her sick. Her pain is under control. She denies recent constipation.  REVIEW OF SYSTEMS:   Constitutional: Denies fevers, chills or abnormal weight loss Eyes: Denies blurriness of vision Ears, nose, mouth, throat, and face: Denies mucositis or sore throat Respiratory: Denies cough, dyspnea or wheezes Cardiovascular: Denies palpitation, chest discomfort or lower extremity swelling Skin: Denies abnormal skin rashes Lymphatics: Denies new lymphadenopathy or easy bruising Neurological:Denies numbness, tingling or new weaknesses Behavioral/Psych: Mood is stable, no new changes  All other systems were reviewed with the patient and are negative.  I have reviewed the past medical history, past surgical history, social history and family history with the patient and they are unchanged from previous note.  ALLERGIES:  is allergic to codeine; fentanyl; and tramadol.  MEDICATIONS:  Current Outpatient Prescriptions  Medication Sig Dispense Refill  . ALPRAZolam (XANAX) 0.25 MG tablet Take 1 tablet (0.25 mg total) by mouth 2 (two) times daily as needed for anxiety. 30 tablet 0  . fluticasone (FLONASE) 50 MCG/ACT nasal spray Place 1 spray into  both nostrils daily. 16 g 1  . lisinopril-hydrochlorothiazide (PRINZIDE) 10-12.5 MG tablet Take 1 tablet by mouth daily. 30 tablet 1  . Magnesium Oxide -Mg Supplement 400 MG CAPS Take 400 mg by mouth daily. Hold if diarrhea. 30 capsule 0  . methadone  (DOLOPHINE) 10 MG/5ML solution Take 55 mg by mouth daily.    Marland Kitchen omeprazole (PRILOSEC) 20 MG capsule Take 1 capsule (20 mg total) by mouth daily. 30 capsule 0  . potassium chloride SA (K-DUR,KLOR-CON) 20 MEQ tablet Take 1 tablet (20 mEq total) by mouth 2 (two) times daily. 14 tablet 0  . acetaminophen (TYLENOL) 500 MG tablet Take 500 mg by mouth every 6 (six) hours as needed for mild pain.     Marland Kitchen emollient (BIAFINE) cream Apply topically 2 (two) times daily.    Marland Kitchen ibuprofen (ADVIL,MOTRIN) 600 MG tablet Take 1 tablet by mouth every 6 hours for 24 hours and then as needed for moderate to severe pain. (Patient not taking: Reported on 05/17/2016) 30 tablet 0  . ondansetron (ZOFRAN) 8 MG tablet Take 1 tablet (8 mg total) by mouth every 8 (eight) hours as needed for nausea or vomiting. (Patient not taking: Reported on 05/17/2016) 20 tablet 0  . prochlorperazine (COMPAZINE) 10 MG tablet Take 1 tablet (10 mg total) by mouth every 6 (six) hours as needed for nausea. (Patient not taking: Reported on 05/17/2016) 20 tablet 0   No current facility-administered medications for this visit.     PHYSICAL EXAMINATION: ECOG PERFORMANCE STATUS: 1 - Symptomatic but completely ambulatory  Vitals:   05/17/16 1112  BP: (!) 144/89  Pulse: 85  Resp: 18  Temp: 98.7 F (37.1 C)   Filed Weights   05/17/16 1112  Weight: 149 lb 4.8 oz (67.7 kg)    GENERAL:alert, no distress and comfortable SKIN: skin color, texture, turgor are normal, no rashes or significant lesions EYES: normal, Conjunctiva are pink and non-injected, sclera clear OROPHARYNX:no exudate, no erythema and lips, buccal mucosa, and tongue normal  NECK: supple, thyroid normal size, non-tender, without nodularity LYMPH:  no palpable lymphadenopathy in the cervical, axillary or inguinal LUNGS: clear to auscultation and percussion with normal breathing effort HEART: regular rate & rhythm and no murmurs and no lower extremity edema ABDOMEN:abdomen soft,  non-tender and normal bowel sounds Musculoskeletal:no cyanosis of digits and no clubbing  NEURO: alert & oriented x 3 with fluent speech, no focal motor/sensory deficits  LABORATORY DATA:  I have reviewed the data as listed    Component Value Date/Time   NA 141 05/16/2016 1043   K 3.4 (L) 05/16/2016 1043   CL 100 (L) 04/08/2016 0630   CO2 29 05/16/2016 1043   GLUCOSE 98 05/16/2016 1043   BUN 19.5 05/16/2016 1043   CREATININE 1.1 05/16/2016 1043   CALCIUM 7.3 (L) 05/16/2016 1043   PROT 7.4 05/16/2016 1043   ALBUMIN 3.8 05/16/2016 1043   AST 28 05/16/2016 1043   ALT 26 05/16/2016 1043   ALKPHOS 86 05/16/2016 1043   BILITOT 0.47 05/16/2016 1043   GFRNONAA >60 04/08/2016 0630   GFRAA >60 04/08/2016 0630    No results found for: SPEP, UPEP  Lab Results  Component Value Date   WBC 2.3 (L) 05/16/2016   NEUTROABS 1.7 05/16/2016   HGB 10.7 (L) 05/16/2016   HCT 31.9 (L) 05/16/2016   MCV 81.6 05/16/2016   PLT 79 (L) 05/16/2016      Chemistry      Component Value Date/Time   NA 141 05/16/2016  1043   K 3.4 (L) 05/16/2016 1043   CL 100 (L) 04/08/2016 0630   CO2 29 05/16/2016 1043   BUN 19.5 05/16/2016 1043   CREATININE 1.1 05/16/2016 1043      Component Value Date/Time   CALCIUM 7.3 (L) 05/16/2016 1043   ALKPHOS 86 05/16/2016 1043   AST 28 05/16/2016 1043   ALT 26 05/16/2016 1043   BILITOT 0.47 05/16/2016 1043      ASSESSMENT & PLAN:  Cervical cancer ([place]) She has started to experience significant side effects from treatment. She had recent severe hypo-magnesium anemia, hypokalemia and pancytopenia. We did not proceed with chemotherapy yesterday. I recommend we focus on supportive care. She has received at least 5 doses of chemotherapy. If she continues to have problems with uncontrolled side effects, I will stop all chemotherapy and recommend we focus on radiation treatments.  Leukopenia due to antineoplastic chemotherapy [facility]) This is due to recent treatment. I  will hold her treatment.  Focus on supportive care. We discussed neutropenic precautions  Anemia due to antineoplastic chemotherapy This is likely due to recent treatment. The patient denies recent history of bleeding such as epistaxis, hematuria or hematochezia. She is asymptomatic from the anemia. I will observe for now.   Hypomagnesemia She has very low magnesium level. She has received intravenous replacement therapy. She will continue magnesium supplement at home. This is related to recent cisplatin  Hypokalemia This is due to hypomagnesemia related to potassium wasting from chemotherapy.  She will continue potassium-rich diet  Continuous tobacco abuse I spent some time counseling the patient the importance of tobacco cessation. We discussed common strategies including nicotine patches, Tobacco Quit-line, and other nicotine replacement products to assist in hereffort to quit  she appears motivated to quit.   Nausea and vomiting She had recent nausea and vomiting likely due to side effects of treatment. She is noncompliant taking anti-emetics. She states that antiemetics make her sick. She denies constipation. I suspect the nausea vomiting is exacerbated by recent electrolyte imbalance. I reinforce importance for her to take antiemetics as needed for nausea.   No orders of the defined types were placed in this encounter.  All questions were answered. The patient knows to call the clinic with any problems, questions or concerns. No barriers to learning was detected. I spent 25 minutes counseling the patient face to face. The total time spent in the appointment was 40 minutes and more than 50% was on counseling and review of test results     Heath Lark, MD 05/17/2016 2:07 PM

## 2016-05-17 NOTE — Progress Notes (Signed)
Valerie Kerr has completed 23 fractions to her pelvis.  She denies having pain other than occasional stomach cramps.  She reports vomiting once over the weekend.  She reports having a poor appetite and is "barely eating small amounts."  She saw the dietician yesterday.  She denies having diarrhea.  She denies having any bladder issues or vaginal bleeding.  She reports having fatigue.  She did not have chemotherapy this week due to her magnesium levels.  BP (!) 151/85 (BP Location: Left Arm)   Pulse 87   Temp 98.3 F (36.8 C) (Oral)   Ht 5\' 1"  (1.549 m)   Wt 149 lb (67.6 kg)   SpO2 100%   BMI 28.15 kg/m    Wt Readings from Last 3 Encounters:  05/17/16 149 lb (67.6 kg)  05/10/16 152 lb (68.9 kg)  05/03/16 159 lb 6.4 oz (72.3 kg)

## 2016-05-17 NOTE — Assessment & Plan Note (Signed)
This is due to recent treatment. I will hold her treatment.  Focus on supportive care. We discussed neutropenic precautions

## 2016-05-17 NOTE — Assessment & Plan Note (Signed)
I spent some time counseling the patient the importance of tobacco cessation. We discussed common strategies including nicotine patches, Tobacco Quit-line, and other nicotine replacement products to assist in hereffort to quit  she appears motivated to quit.  

## 2016-05-17 NOTE — Telephone Encounter (Signed)
Gave patient avs report and appointments for February and March.  °

## 2016-05-17 NOTE — Assessment & Plan Note (Signed)
She has very low magnesium level. She has received intravenous replacement therapy. She will continue magnesium supplement at home. This is related to recent cisplatin

## 2016-05-18 ENCOUNTER — Ambulatory Visit: Payer: Medicaid Other

## 2016-05-18 ENCOUNTER — Encounter (HOSPITAL_BASED_OUTPATIENT_CLINIC_OR_DEPARTMENT_OTHER): Payer: Self-pay | Admitting: *Deleted

## 2016-05-18 ENCOUNTER — Ambulatory Visit
Admission: RE | Admit: 2016-05-18 | Discharge: 2016-05-18 | Disposition: A | Discharge status: Home / Self Care | Payer: Medicaid Other | Source: Ambulatory Visit | Attending: Radiation Oncology | Admitting: Radiation Oncology

## 2016-05-18 MED FILL — ALPRAZolam 0.25 MG TABS: 0.25 | 15 days supply | Qty: 30 | Fill #0

## 2016-05-18 NOTE — Progress Notes (Signed)
To [facility] at 0600- Current labs,Ekg in chart/epic.Npo after Mn -will take prilosec,methadone,xanax in am with small amt water.

## 2016-05-19 ENCOUNTER — Ambulatory Visit
Admission: RE | Admit: 2016-05-19 | Discharge: 2016-05-19 | Disposition: A | Discharge status: Home / Self Care | Payer: Medicaid Other | Source: Ambulatory Visit | Attending: Radiation Oncology | Admitting: Radiation Oncology

## 2016-05-20 ENCOUNTER — Emergency Department (HOSPITAL_COMMUNITY): Payer: Self-pay

## 2016-05-20 ENCOUNTER — Encounter (HOSPITAL_COMMUNITY): Payer: Self-pay

## 2016-05-20 ENCOUNTER — Ambulatory Visit: Payer: Medicaid Other

## 2016-05-20 ENCOUNTER — Other Ambulatory Visit: Payer: Self-pay

## 2016-05-20 ENCOUNTER — Emergency Department (HOSPITAL_COMMUNITY)
Admission: EM | Admit: 2016-05-20 | Discharge: 2016-05-20 | Disposition: A | Discharge status: Home / Self Care | Payer: Self-pay | Attending: Emergency Medicine | Admitting: Emergency Medicine

## 2016-05-20 DIAGNOSIS — F17210 Nicotine dependence, cigarettes, uncomplicated: Secondary | ICD-10-CM | POA: Insufficient documentation

## 2016-05-20 DIAGNOSIS — R070 Pain in throat: Secondary | ICD-10-CM | POA: Insufficient documentation

## 2016-05-20 DIAGNOSIS — I10 Essential (primary) hypertension: Secondary | ICD-10-CM | POA: Insufficient documentation

## 2016-05-20 DIAGNOSIS — Z8541 Personal history of malignant neoplasm of cervix uteri: Secondary | ICD-10-CM | POA: Insufficient documentation

## 2016-05-20 NOTE — ED Triage Notes (Signed)
Pt reports she is unable to belch but feels the urge to. She states he had a titanium plate placed/cervical disk surgery in 2011 and states since then she has been having trouble with acid reflux.

## 2016-05-20 NOTE — ED Notes (Signed)
ED Provider at bedside. 

## 2016-05-20 NOTE — ED Notes (Signed)
Pt stable, ambulatory, states understanding of discharge instructions 

## 2016-05-20 NOTE — ED Provider Notes (Signed)
[facility] DEPT Provider Note   CSN: [id number] Arrival date & time: 05/20/16  1219     History   Chief Complaint Chief Complaint  Patient presents with  . Trouble Belching    HPI Valerie Kerr is a 56 y.o. female.  HPI   Valerie Kerr is a 56 y.o. female, with a history of Cervical cancer, GERD, and HTN, presenting to the ED with difficulty belching. She adds, "I feel like a have a big burp that is just sitting in my throat, like I need someone to pat me on the back and help me burp." She states that for the last several years she will intermittently will have a sensation like she has a belch that is stuck in her throat. She states she thinks it may be connected with her cervical spine surgery in 2011. She has not had issues eating or drinking. She began to have this sensation about an hour ago. She tried to relieve it by drinking soda, but this did not work. She has had previous success with this method. She states, "I didn't drink much soda because I thought the gas bubble might travel to my heart and kill me."  She denies fever, N/V, CP, SOB, trouble swallowing, abdominal pain, or any other complaints.  Patient adds that she has active cervical cancer. She is undergoing radiation and chemotherapy. Last radiation treatment was yesterday. She undergoes chemotherapy weekly. She denies knowledge of any metastatic disease.   Past Medical History:  Diagnosis Date  . Acid reflux   . Anxiety   . Cervical cancer ([place])   . Chronic pain disorder    back,neck  . Dental caries    periodontitis  . Environmental allergies    allergy to dust mites  . Headache   . Herniated disc, cervical    and lumbar  . Hypertension     Patient Active Problem List   Diagnosis Date Noted  . Nausea and vomiting 05/17/2016  . Leukopenia due to antineoplastic chemotherapy ([place]) 05/12/2016  . Hypomagnesemia 05/12/2016  . Hypokalemia 05/12/2016  . Anemia due to antineoplastic chemotherapy  05/04/2016  . Continuous tobacco abuse 04/01/2016  . Poor dentition 04/01/2016  . Essential hypertension 04/01/2016  . 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 Allergic sinusitis 04/01/2016  . Gastroesophageal reflux disease 04/01/2016  . Cervical cancer ([place]) 03/03/2016    Past Surgical History:  Procedure Laterality Date  . ABCESS DRAINAGE Left 1982   breast  . CARPAL TUNNEL RELEASE Bilateral   . CERVICAL DISC SURGERY  2010  . MULTIPLE EXTRACTIONS WITH ALVEOLOPLASTY N/A 04/08/2016   Procedure: Extraction of tooth #'s [id number] with alveoloplasty  and gross debridement of remaining teeth;  Surgeon: Lenn Cal, DDS;  Location: [place];  Service: Oral Surgery;  Laterality: N/A;    OB History    No data available       Home Medications    Prior to Admission medications   Medication Sig Start Date End Date Taking? Authorizing Provider  acetaminophen (TYLENOL) 500 MG tablet Take 500 mg by mouth every 6 (six) hours as needed for mild pain.    Yes Historical Provider, MD  ALPRAZolam (XANAX) 0.25 MG tablet Take 1 tablet (0.25 mg total) by mouth 2 (two) times daily as needed for anxiety. 05/10/16  Yes [name], MD  fluticasone (FLONASE) 50 MCG/ACT nasal spray Place 1 spray into both nostrils daily. 05/17/16  Yes Heath Lark, MD  ibuprofen (ADVIL,MOTRIN) 600 MG tablet Take 1 tablet by mouth  every 6 hours for 24 hours and then as needed for moderate to severe pain. 04/08/16  Yes Lenn Cal, DDS  lisinopril-hydrochlorothiazide (PRINZIDE) 10-12.5 MG tablet Take 1 tablet by mouth daily. 04/25/16  Yes Lennis Marion Downer, MD  Magnesium Oxide -Mg Supplement 400 MG CAPS Take 400 mg by mouth daily. Hold if diarrhea. 05/09/16  Yes Heath Lark, MD  methadone (DOLOPHINE) 10 MG/5ML solution Take 55 mg by mouth daily.   Yes Historical Provider, MD  omeprazole (PRILOSEC) 20 MG capsule Take 1 capsule (20 mg total) by mouth daily. 04/15/16  Yes Lennis Marion Downer, MD  Phenylephrine-APAP-Guaifenesin (SINUS  CONGESTION/PAIN SEVERE PO) Take 1 capsule by mouth daily as needed (sinus pain).   Yes Historical Provider, MD  potassium chloride SA (K-DUR,KLOR-CON) 20 MEQ tablet Take 1 tablet (20 mEq total) by mouth 2 (two) times daily. 05/09/16  Yes Heath Lark, MD  ondansetron (ZOFRAN) 8 MG tablet Take 1 tablet (8 mg total) by mouth every 8 (eight) hours as needed for nausea or vomiting. Patient not taking: Reported on 05/17/2016 04/15/16   Gordy Levan, MD  prochlorperazine (COMPAZINE) 10 MG tablet Take 1 tablet (10 mg total) by mouth every 6 (six) hours as needed for nausea. Patient not taking: Reported on 05/17/2016 04/05/16   Gordy Levan, MD    Family History Family History  Problem Relation Age of Onset  . Cancer Father   . Cancer Sister     throat  . Cancer Brother     throat and stomach  . Cancer Brother     lung    Social History Social History  Substance Use Topics  . Smoking status: Current Every Day Smoker    Packs/day: 0.25    Years: 45.00    Types: Cigarettes  . Smokeless tobacco: Never Used  . Alcohol use No     Allergies   Codeine; Fentanyl; and Tramadol   Review of Systems Review of Systems  Constitutional: Negative for chills and fever.  Respiratory: Negative for cough and shortness of breath.   Cardiovascular: Negative for chest pain.  Gastrointestinal: Negative for abdominal pain, nausea and vomiting.       Difficulty belching  Musculoskeletal: Negative for back pain.  Neurological: Negative for dizziness, weakness, light-headedness, numbness and headaches.  All other systems reviewed and are negative.    Physical Exam Updated Vital Signs BP 154/93 (BP Location: Left Arm)   Pulse 101   Temp 98.3 F (36.8 C) (Oral)   Resp 21   Ht 5\' 1"  (1.549 m)   Wt 67.6 kg   SpO2 97%   BMI 28.15 kg/m   Physical Exam  Constitutional: She is oriented to person, place, and time. She appears well-developed and well-nourished. No distress.  HENT:  Head:  Normocephalic and atraumatic.  Mouth/Throat: Oropharynx is clear and moist.  No difficulty swallowing or talking. Able to handle oral secretions without difficulty.  Eyes: Conjunctivae and EOM are normal. Pupils are equal, round, and reactive to light.  Neck: Normal range of motion. Neck supple.  No swelling noted to the soft tissues of the neck.  Cardiovascular: Normal rate, regular rhythm, normal heart sounds and intact distal pulses.   Pulmonary/Chest: Effort normal and breath sounds normal. No respiratory distress.  No increased work of breathing. Patient speaks in full sentences without difficulty.  Abdominal: Soft. There is no tenderness. There is no guarding.  Musculoskeletal: She exhibits no edema.  Lymphadenopathy:    She has no cervical adenopathy.  Neurological: She is alert and oriented to person, place, and time.  No sensory deficits. Strength 5/5 in all extremities. No gait disturbance. Coordination intact including heel to shin and finger to nose. Cranial nerves III-XII grossly intact. No facial droop.   Skin: Skin is warm and dry. She is not diaphoretic.  Psychiatric: She has a normal mood and affect. Her behavior is normal.  Nursing note and vitals reviewed.    ED Treatments / Results  Labs (all labs ordered are listed, but only abnormal results are displayed) Labs Reviewed - No data to display  EKG  EKG Interpretation None       Radiology Dg Chest 2 View  Result Date: 05/20/2016 CLINICAL DATA:  Belching EXAM: CHEST  2 VIEW COMPARISON:  12/24/2015 FINDINGS: Heart size and vascularity normal.  Negative for pneumonia. Soft tissue density in the posterior costophrenic angle on the lateral view similar to the prior study. Review of the recent PET-CT 03/29/2016 reveals facet hypertrophy which may account for this density on chest x-ray. No Bochdalek's hernia or lung nodules seen on the prior CT. IMPRESSION: No acute abnormality. Electronically Signed   By: Franchot Gallo M.D.   On: 05/20/2016 14:30    Procedures Procedures (including critical care time)  Medications Ordered in ED Medications - No data to display   Initial Impression / Assessment and Plan / ED Course  I have reviewed the triage vital signs and the nursing notes.  Pertinent labs & imaging results that were available during my care of the patient were reviewed by me and considered in my medical decision making (see chart for details).     Patient presents with a complaint of difficulty belching. She has no neurologic deficits on exam. She has had this issue before. She has not had radiation therapy to the chest. She has no known metastatic disease. Patient voices improvement following reassurance. She voices further improvement with ginger ale and hot tea. She is readily able to drink fluids and eat crackers. No regurgitation.  She states that she has a PCP appointment already scheduled for 2/26. Return precautions discussed. Patient voices understanding of all instructions and is comfortable with discharge.  Findings and plan of care discussed with Duffy Bruce, MD.    Vitals:   05/20/16 1224 05/20/16 1252 05/20/16 1545  BP: 154/93 161/97 152/95  Pulse: 101 106 98  Resp: 21  19  Temp: 98.3 F (36.8 C)    TempSrc: Oral    SpO2: 97% 99% 99%  Weight: 67.6 kg    Height: 5\' 1"  (1.549 m)       Final Clinical Impressions(s) / ED Diagnoses   Final diagnoses:  Throat discomfort    New Prescriptions New Prescriptions   No medications on file     Layla Maw 05/20/16 [place], MD 05/20/16 [phone]

## 2016-05-20 NOTE — Discharge Instructions (Signed)
There were no abnormalities noted on the testing completed today. Please continue to stay well hydrated. Follow up with your primary doctor, as scheduled, on 2/26. Return to the ED for worsening symptoms.   Follow-up with the GI specialist on this matter as well.

## 2016-05-23 ENCOUNTER — Ambulatory Visit: Payer: Medicaid Other

## 2016-05-23 ENCOUNTER — Ambulatory Visit
Admission: RE | Admit: 2016-05-23 | Discharge: 2016-05-23 | Disposition: A | Discharge status: Home / Self Care | Payer: Medicaid Other | Source: Ambulatory Visit | Attending: Radiation Oncology | Admitting: Radiation Oncology

## 2016-05-23 ENCOUNTER — Ambulatory Visit: Payer: Self-pay | Attending: Family Medicine | Admitting: Family Medicine

## 2016-05-23 ENCOUNTER — Encounter: Payer: Self-pay | Admitting: Family Medicine

## 2016-05-23 ENCOUNTER — Other Ambulatory Visit (HOSPITAL_BASED_OUTPATIENT_CLINIC_OR_DEPARTMENT_OTHER): Payer: Self-pay

## 2016-05-23 VITALS — BP 125/85 | HR 96 | Temp 98.4°F | Ht 61.0 in | Wt 145.2 lb

## 2016-05-23 DIAGNOSIS — K3184 Gastroparesis: Secondary | ICD-10-CM | POA: Insufficient documentation

## 2016-05-23 DIAGNOSIS — R634 Abnormal weight loss: Secondary | ICD-10-CM | POA: Insufficient documentation

## 2016-05-23 DIAGNOSIS — K219 Gastro-esophageal reflux disease without esophagitis: Secondary | ICD-10-CM | POA: Insufficient documentation

## 2016-05-23 DIAGNOSIS — Z79899 Other long term (current) drug therapy: Secondary | ICD-10-CM | POA: Insufficient documentation

## 2016-05-23 DIAGNOSIS — C539 Malignant neoplasm of cervix uteri, unspecified: Secondary | ICD-10-CM | POA: Insufficient documentation

## 2016-05-23 DIAGNOSIS — I10 Essential (primary) hypertension: Secondary | ICD-10-CM | POA: Insufficient documentation

## 2016-05-23 DIAGNOSIS — R630 Anorexia: Secondary | ICD-10-CM | POA: Insufficient documentation

## 2016-05-23 LAB — COMPREHENSIVE METABOLIC PANEL
ALBUMIN: 4 g/dL (ref 3.5–5.0)
ALK PHOS: 95 U/L (ref 40–150)
ALT: 17 U/L (ref 0–55)
ANION GAP: 10 meq/L (ref 3–11)
AST: 23 U/L (ref 5–34)
BUN: 19.5 mg/dL (ref 7.0–26.0)
CALCIUM: 8.4 mg/dL (ref 8.4–10.4)
CO2: 32 mEq/L — ABNORMAL HIGH (ref 22–29)
Chloride: 101 mEq/L (ref 98–109)
Creatinine: 1.2 mg/dL — ABNORMAL HIGH (ref 0.6–1.1)
EGFR: 57 mL/min/{1.73_m2} — AB (ref >90)
Glucose: 86 mg/dl (ref 70–140)
Potassium: 4.1 mEq/L (ref 3.5–5.1)
SODIUM: 143 meq/L (ref 136–145)
Total Bilirubin: 0.62 mg/dL (ref 0.20–1.20)
Total Protein: 7.7 g/dL (ref 6.4–8.3)

## 2016-05-23 LAB — CBC WITH DIFFERENTIAL/PLATELET
BASO%: 0 % (ref 0.0–2.0)
BASOS ABS: 0 10*3/uL (ref 0.0–0.1)
EOS ABS: 0 10*3/uL (ref 0.0–0.5)
EOS%: 0.7 % (ref 0.0–7.0)
HEMATOCRIT: 32.3 % — AB (ref 34.8–46.6)
HEMOGLOBIN: 10.6 g/dL — AB (ref 11.6–15.9)
LYMPH%: 21.1 % (ref 14.0–49.7)
MCH: 27.5 pg (ref 25.1–34.0)
MCHC: 32.8 g/dL (ref 31.5–36.0)
MCV: 83.9 fL (ref 79.5–101.0)
MONO#: 0.1 10*3/uL (ref 0.1–0.9)
MONO%: 5.9 % (ref 0.0–14.0)
NEUT#: 1.1 10*3/uL — ABNORMAL LOW (ref 1.5–6.5)
NEUT%: 72.3 % (ref 38.4–76.8)
Platelets: 108 10*3/uL — ABNORMAL LOW (ref 145–400)
RBC: 3.85 10*6/uL (ref 3.70–5.45)
RDW: 14 % (ref 11.2–14.5)
WBC: 1.5 10*3/uL — ABNORMAL LOW (ref 3.9–10.3)
lymph#: 0.3 10*3/uL — ABNORMAL LOW (ref 0.9–3.3)

## 2016-05-23 LAB — MAGNESIUM: MAGNESIUM: 1.1 mg/dL — AB (ref 1.5–2.5)

## 2016-05-23 MED ORDER — LISINOPRIL-HYDROCHLOROTHIAZIDE 10-12.5 MG PO TABS: 1.0000 | ORAL_TABLET | Freq: Every day | ORAL | 3 refills | Status: DC

## 2016-05-23 MED ORDER — SIMETHICONE 80 MG PO CHEW: CHEWABLE_TABLET | ORAL | 1 refills | Status: DC

## 2016-05-23 MED ORDER — LISINOPRIL-HYDROCHLOROTHIAZIDE 10-12.5 MG PO TABS: 1.0000 | ORAL_TABLET | Freq: Every day | ORAL | 1 refills | Status: DC

## 2016-05-23 MED ORDER — OMEPRAZOLE 20 MG PO CPDR: 20.0000 mg | DELAYED_RELEASE_CAPSULE | Freq: Every day | ORAL | 0 refills | Status: DC

## 2016-05-23 MED ORDER — OMEPRAZOLE 20 MG PO CPDR: 20.0000 mg | DELAYED_RELEASE_CAPSULE | Freq: Every day | ORAL | 3 refills | Status: DC

## 2016-05-23 MED ORDER — METOCLOPRAMIDE HCL 10 MG PO TABS: 10.0000 mg | ORAL_TABLET | Freq: Three times a day (TID) | ORAL | 1 refills | Status: DC | PRN

## 2016-05-23 MED ORDER — SIMETHICONE 80 MG PO CHEW: CHEWABLE_TABLET | ORAL | 3 refills | Status: DC

## 2016-05-23 MED ORDER — METOCLOPRAMIDE HCL 10 MG PO TABS: 10.0000 mg | ORAL_TABLET | Freq: Three times a day (TID) | ORAL | 3 refills | Status: DC | PRN

## 2016-05-23 NOTE — Progress Notes (Signed)
Subjective:  Patient ID: Valerie Kerr, female    DOB: [birth year]  Age: 56 y.o. MRN: [id number]  CC: Back Pain (lower back); Hypertension; Anxiety; Sinusitis; Cervical Cancer (dx in december); Weight Loss; and Anorexia   HPI Valerie Kerr presents To establish care. 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  She complains of the need to burp but then is unable to get it up her past her throat; she endorses anorexia and this has led to weight loss and it is concerning for her. Denies constipation or diarrhea and has a feeling of early satiety; Currently on omeprazole. She has had a similar problem with burping in the past. She did have some nausea in the past for which she used Zofran but states she no longer needs it.  Past Medical History:  Diagnosis Date  . Acid reflux   . Anxiety   . Cervical cancer ([place])   . Chronic pain disorder    back,neck  . Dental caries    periodontitis  . Environmental allergies    allergy to dust mites  . Headache   . Herniated disc, cervical    and lumbar  . Hypertension    Past Surgical History:  Procedure Laterality Date  . ABCESS DRAINAGE Left 1982   breast  . CARPAL TUNNEL RELEASE Bilateral   . CERVICAL DISC SURGERY  2010  . MULTIPLE EXTRACTIONS WITH ALVEOLOPLASTY N/A 04/08/2016   Procedure: Extraction of tooth #'s [phone] with alveoloplasty  and gross debridement of remaining teeth;  Surgeon: Lenn Cal, DDS;  Location: [facility];  Service: Oral Surgery;  Laterality: N/A;    Allergies  Allergen Reactions  . Codeine Itching and Rash  . Fentanyl Rash    Skin rash, local swelling from patch  . Tramadol Other (See Comments)    GI upset,also trembling sensation     Outpatient Medications  Prior to Visit  Medication Sig Dispense Refill  . acetaminophen (TYLENOL) 500 MG tablet Take 500 mg by mouth every 6 (six) hours as needed for mild pain.     Marland Kitchen ALPRAZolam (XANAX) 0.25 MG tablet Take 1 tablet (0.25 mg total) by mouth 2 (two) times daily as needed for anxiety. 30 tablet 0  . fluticasone (FLONASE) 50 MCG/ACT nasal spray Place 1 spray into both nostrils daily. 16 g 1  . ibuprofen (ADVIL,MOTRIN) 600 MG tablet Take 1 tablet by mouth every 6 hours for 24 hours and then as needed for moderate to severe pain. 30 tablet 0  . Magnesium Oxide -Mg Supplement 400 MG CAPS Take 400 mg by mouth daily. Hold if diarrhea. 30 capsule 0  . methadone (DOLOPHINE) 10 MG/5ML solution Take 55 mg by mouth daily.    Marland Kitchen Phenylephrine-APAP-Guaifenesin (SINUS CONGESTION/PAIN SEVERE PO) Take 1 capsule by mouth daily as needed (sinus pain).    . potassium chloride SA (K-DUR,KLOR-CON) 20 MEQ tablet Take 1 tablet (20 mEq total) by mouth 2 (two) times daily. 14 tablet 0  . prochlorperazine (COMPAZINE) 10 MG tablet Take 1 tablet (10 mg total) by mouth every 6 (six) hours as needed for nausea. 20 tablet 0  . lisinopril-hydrochlorothiazide (PRINZIDE) 10-12.5 MG tablet Take 1 tablet by mouth daily. 30 tablet 1  . omeprazole (PRILOSEC) 20 MG capsule Take 1 capsule (20 mg total) by mouth daily. 30 capsule 0  . ondansetron (ZOFRAN)  8 MG tablet Take 1 tablet (8 mg total) by mouth every 8 (eight) hours as needed for nausea or vomiting. 20 tablet 0   No facility-administered medications prior to visit.     ROS Review of Systems  Constitutional: Positive for appetite change and unexpected weight change. Negative for activity change and fatigue.  HENT: Negative for congestion, sinus pressure and sore throat.   Eyes: Negative for visual disturbance.  Respiratory: Negative for cough, chest tightness, shortness of breath and wheezing.   Cardiovascular: Negative for chest pain and palpitations.  Gastrointestinal: Negative for  abdominal distention, abdominal pain and constipation.  Endocrine: Negative for polydipsia.  Genitourinary: Negative for dysuria and frequency.  Musculoskeletal: Negative for arthralgias and back pain.  Skin: Negative for rash.  Neurological: Negative for tremors, light-headedness and numbness.  Hematological: Does not bruise/bleed easily.  Psychiatric/Behavioral: Negative for agitation and behavioral problems.    Objective:  BP 125/85 (BP Location: Right Arm, Patient Position: Sitting, Cuff Size: Small)   Pulse 96   Temp 98.4 F (36.9 C) (Oral)   Ht 5\' 1"  (1.549 m)   Wt 145 lb 3.2 oz (65.9 kg)   SpO2 96%   BMI 27.44 kg/m   BP/Weight 05/23/2016 05/20/2016 [id number]  Systolic BP 0000000 0000000 [id number]  Diastolic BP 85 95 85  Wt. (Lbs) 145.2 149 149  BMI 27.44 28.15 28.15      Physical Exam  Constitutional: She is oriented to person, place, and time. She appears well-developed and well-nourished.  Cardiovascular: Normal rate, normal heart sounds and intact distal pulses.   No murmur heard. Pulmonary/Chest: Effort normal and breath sounds normal. She has no wheezes. She has no rales. She exhibits no tenderness.  Abdominal: Soft. Bowel sounds are normal. She exhibits no distension and no mass. There is no tenderness.  Musculoskeletal: Normal range of motion.  Neurological: She is alert and oriented to person, place, and time.  Skin: Skin is warm and dry.  Psychiatric: She has a normal mood and affect.     Assessment & Plan:   1. Malignant neoplasm of cervix, unspecified site [facility]) Currently on chemotherapy and radiation Scheduled to see oncology tomorrow   2. Essential hypertension Controlled Low-sodium diet - lisinopril-hydrochlorothiazide (PRINZIDE) 10-12.5 MG tablet; Take 1 tablet by mouth daily.  Dispense: 30 tablet; Refill: 3  3. Gastroparesis Could be side effects from cancer treatment; this could also explain excessive bloating Advised to eat small frequent  portions Initially placed on metoclopramide which I discontinued due to interaction with Lexapro and Increased risk of extrapyramidal symptoms Continue Compazine which she received from the [facility]. - simethicone (GAS-X) 80 MG chewable tablet; 1 tablet by mouth every 6 hours as needed for burping or flatulence.  Dispense: 120 tablet; Refill: 3  4. Gastroesophageal reflux disease, esophagitis presence not specified Stable - omeprazole (PRILOSEC) 20 MG capsule; Take 1 capsule (20 mg total) by mouth daily.  Dispense: 30 capsule; Refill: 3  5. Weight loss Likely secondary to anorexia  6. Anorexia Likely cancer related.   Meds ordered this encounter  Medications  . DISCONTD: omeprazole (PRILOSEC) 20 MG capsule    Sig: Take 1 capsule (20 mg total) by mouth daily.    Dispense:  30 capsule    Refill:  0  . DISCONTD: lisinopril-hydrochlorothiazide (PRINZIDE) 10-12.5 MG tablet    Sig: Take 1 tablet by mouth daily.    Dispense:  30 tablet    Refill:  1  . DISCONTD: metoCLOPramide (REGLAN) 10 MG tablet  Sig: Take 1 tablet (10 mg total) by mouth every 8 (eight) hours as needed for nausea.    Dispense:  90 tablet    Refill:  1  . DISCONTD: simethicone (GAS-X) 80 MG chewable tablet    Sig: 1 tablet by mouth every 6 hours as needed for burping or flatulence.    Dispense:  120 tablet    Refill:  1  . lisinopril-hydrochlorothiazide (PRINZIDE) 10-12.5 MG tablet    Sig: Take 1 tablet by mouth daily.    Dispense:  30 tablet    Refill:  3  . metoCLOPramide (REGLAN) 10 MG tablet    Sig: Take 1 tablet (10 mg total) by mouth every 8 (eight) hours as needed for nausea.    Dispense:  90 tablet    Refill:  3  . simethicone (GAS-X) 80 MG chewable tablet    Sig: 1 tablet by mouth every 6 hours as needed for burping or flatulence.    Dispense:  120 tablet    Refill:  3  . omeprazole (PRILOSEC) 20 MG capsule    Sig: Take 1 capsule (20 mg total) by mouth daily.    Dispense:  30 capsule     Refill:  3    Follow-up: Return in about 6 weeks (around 07/04/2016) for Follow-up on chronic medical conditions.   [name] MD

## 2016-05-24 ENCOUNTER — Ambulatory Visit: Payer: Medicaid Other

## 2016-05-24 ENCOUNTER — Ambulatory Visit (HOSPITAL_BASED_OUTPATIENT_CLINIC_OR_DEPARTMENT_OTHER): Payer: Self-pay | Admitting: Hematology and Oncology

## 2016-05-24 ENCOUNTER — Encounter: Payer: Self-pay | Admitting: Hematology and Oncology

## 2016-05-24 ENCOUNTER — Ambulatory Visit
Admission: RE | Admit: 2016-05-24 | Discharge: 2016-05-24 | Disposition: A | Discharge status: Home / Self Care | Payer: Medicaid Other | Source: Ambulatory Visit | Attending: Radiation Oncology | Admitting: Radiation Oncology

## 2016-05-24 ENCOUNTER — Encounter: Payer: Self-pay | Admitting: Radiation Oncology

## 2016-05-24 ENCOUNTER — Other Ambulatory Visit: Payer: Self-pay | Admitting: Hematology and Oncology

## 2016-05-24 ENCOUNTER — Ambulatory Visit (HOSPITAL_BASED_OUTPATIENT_CLINIC_OR_DEPARTMENT_OTHER): Payer: Self-pay

## 2016-05-24 ENCOUNTER — Telehealth: Payer: Self-pay | Admitting: Hematology and Oncology

## 2016-05-24 VITALS — BP 124/74 | HR 95 | Temp 98.5°F | Ht 61.0 in | Wt 144.6 lb

## 2016-05-24 DIAGNOSIS — E876 Hypokalemia: Secondary | ICD-10-CM

## 2016-05-24 DIAGNOSIS — D701 Agranulocytosis secondary to cancer chemotherapy: Secondary | ICD-10-CM

## 2016-05-24 DIAGNOSIS — T451X5A Adverse effect of antineoplastic and immunosuppressive drugs, initial encounter: Secondary | ICD-10-CM

## 2016-05-24 DIAGNOSIS — D696 Thrombocytopenia, unspecified: Secondary | ICD-10-CM

## 2016-05-24 DIAGNOSIS — C539 Malignant neoplasm of cervix uteri, unspecified: Secondary | ICD-10-CM

## 2016-05-24 DIAGNOSIS — E860 Dehydration: Secondary | ICD-10-CM

## 2016-05-24 DIAGNOSIS — F419 Anxiety disorder, unspecified: Secondary | ICD-10-CM

## 2016-05-24 DIAGNOSIS — Z720 Tobacco use: Secondary | ICD-10-CM

## 2016-05-24 DIAGNOSIS — D6481 Anemia due to antineoplastic chemotherapy: Secondary | ICD-10-CM

## 2016-05-24 MED ORDER — SODIUM CHLORIDE 0.9 % IV SOLN
4.0000 g | Freq: Once | INTRAVENOUS | Status: AC
  Administered 2016-05-24: 4 g via INTRAVENOUS
  Filled 2016-05-24: qty 8

## 2016-05-24 MED ORDER — SODIUM CHLORIDE 0.9 % IV SOLN
Freq: Once | INTRAVENOUS | Status: AC
  Administered 2016-05-24: 13:00:00 via INTRAVENOUS

## 2016-05-24 NOTE — Assessment & Plan Note (Signed)
This is due to recent treatment. I will hold her treatment.  Focus on supportive care. We discussed neutropenic precautions

## 2016-05-24 NOTE — Assessment & Plan Note (Signed)
I spent some time counseling the patient the importance of tobacco cessation. We discussed common strategies including nicotine patches, Tobacco Quit-line, and other nicotine replacement products to assist in hereffort to quit  she appears motivated to quit.  

## 2016-05-24 NOTE — Assessment & Plan Note (Signed)
She has signs of acute on chronic renal failure. We will give her IV fluid hydration

## 2016-05-24 NOTE — Assessment & Plan Note (Signed)
This is due to hypomagnesemia related to potassium wasting from chemotherapy.  She will continue potassium-rich diet This is stable

## 2016-05-24 NOTE — Progress Notes (Signed)
  Radiation Oncology         [phone] ________________________________  Name: Valerie Kerr MRN: [id number]  Date: 05/24/2016  DOB: 12/23/1960  Weekly Radiation Therapy Management    ICD-9-CM ICD-10-CM   1. Malignant neoplasm of cervix, unspecified site ([place]) 180.9 C53.9      Current Dose: 48.6 Gy     Planned Dose:  54+ Gy  Narrative . . . . . . . . The patient presents for routine under treatment assessment.                                    Valerie Kerr has completed 27 fractions to her pelvis. She denies having pain, but has had trouble with belching. It was recommended that she start taking Gas-X by her primary care doctor. She denies having any bladder issues. She reports having some rectal irritation. She reports a blister on both sides of her rectal area. She was provided with a sitz bath by the nurse.  She denies having any vaginal bleeding or diarrhea. The patient reports a poor memory.                                  Set-up films were reviewed.                                 The chart was checked. Physical Findings. . .  height is 5\' 1"  (1.549 m) and weight is 144 lb 9.6 oz (65.6 kg). Her oral temperature is 98.5 F (36.9 C). Her blood pressure is 124/74 and her pulse is 95. Her oxygen saturation is 100%.  Lungs are clear to auscultation bilaterally. Heart has regular rate and rhythm. Abdomen soft, non-tender, normal bowel sounds. Impression . . . . . . . The patient has emesis, nausea, loss of appetite, abdominal cramps, and rectal irritation. Plan . . . . . . . . . . . . The patient was originally scheduled to begin her first brachytherapy treatment on 05/17/16, but given the patient's unscheduled trip and her emotional situation at this time, we postponed her first brachytherapy procedure until 05/26/16. In the meantime the patient will continue to receive regular daily radiation treatments and chemotherapy. ________________________________   [name], PhD, MD  This  document serves as a record of services personally performed by [name], MD. It was created on his behalf by [name], a trained medical scribe. The creation of this record is based on the scribe's personal observations and the provider's statements to them. This document has been checked and approved by the attending provider.

## 2016-05-24 NOTE — Assessment & Plan Note (Signed)
She has very low magnesium level. She will receive intravenous replacement therapy. She will continue magnesium supplement at home. This is related to recent cisplatin

## 2016-05-24 NOTE — Assessment & Plan Note (Signed)
She has signs of dehydration and mild acute renal failure.  Hold chemotherapy as above and will give her IV fluid resuscitation.

## 2016-05-24 NOTE — Assessment & Plan Note (Signed)
She complains of problem with anxiety.  She denies depression.  She is taking Xanax as needed. We discussed potential long-term medications such as SSRI.  The patient declines. We will get our social worker to talk to the patient first before referring her to a psychiatrist or psychologist.  She agreed

## 2016-05-24 NOTE — Assessment & Plan Note (Signed)
This is likely due to recent treatment. The patient denies recent history of bleeding such as epistaxis, hematuria or hematochezia. She is asymptomatic from the low platelet count. I will observe for now.  

## 2016-05-24 NOTE — Assessment & Plan Note (Signed)
This is likely due to recent treatment. The patient denies recent history of bleeding such as epistaxis, hematuria or hematochezia. She is asymptomatic from the anemia. I will observe for now.   

## 2016-05-24 NOTE — Assessment & Plan Note (Signed)
She has started to experience significant side effects from treatment. She had recent severe hypo-magnesium anemia, hypokalemia and pancytopenia. I recommend we focus on supportive care. She has received at least 5 doses of chemotherapy.

## 2016-05-24 NOTE — Assessment & Plan Note (Signed)
She has very low magnesium level. She will continue to receive intravenous replacement therapy. She will continue magnesium supplement at home. This is related to recent cisplatin

## 2016-05-24 NOTE — Telephone Encounter (Signed)
sw pt to confirm added appts to 3/5 per LOS

## 2016-05-24 NOTE — Assessment & Plan Note (Signed)
She has started to experience significant side effects from treatment. She had recent severe hypo-magnesium anemia, hypokalemia and pancytopenia. We will not proceed with further chemotherapy I recommend we focus on supportive care. She has received at least 5 doses of chemotherapy.

## 2016-05-24 NOTE — Progress Notes (Signed)
[name] OFFICE PROGRESS NOTE  Patient Care Team: Valerie Morale, MD as PCP - General (Family Medicine)  SUMMARY OF ONCOLOGIC HISTORY:   Cervical cancer ([place])   02/20/2016 Initial Diagnosis    She presented to the ED with vaginal bleeding and abnormal pelvic mass. She was seen in the office by Dr Baron Sane 03/01/16 who performed cervical biopsies of a friable mass which revealed poorly differentiated squamous cell carcinoma.      03/01/2016 Pathology Results    1. Cervix, biopsy, mass INFILTRATIVE SQUAMOUS CELL CARCINOMA 2. Cervix, biopsy INVASIVE POORLY DIFFERENTIATED SQUAMOUS CELL CARCINOMA      03/03/2016 Imaging    Pelvic US was Suboptimal study due to bowel gas. Difficult visualization of the uterus and adnexal structures appear probable multiple fibroids within the uterus. Endometrium thickened at 14 mm. In the setting of post-menopausal bleeding, endometrial sampling is indicated to exclude carcinoma. If results are benign, sonohysterogram should be considered for focal lesion work-up.       03/23/2016 Miscellaneous    Examination by Dr. [name] in the office concluded the cervix is completely replaced by a 6cm exophytic friable mass which is replacing the cervix and encroaching the upper vaginal fornices and the parametrium bilaterally.      04/04/2016 PET scan    5.5 cm hypermetabolic cervical mass, consistent with known primary cervical carcinoma. No definite local or distant metastatic disease. Sub-cm hypermetabolic left axillary lymph nodes, likely reactive in etiology. Recommend continued attention on follow-up imaging.       04/08/2016 Procedure    Dr. Enrique Sack performed Multiple extraction of tooth numbers 3, 9, 21, 30, and 31 .  2 Quadrants of alveoloplasty 3. Gross debridement of remaining dentition         04/12/2016 -  Chemotherapy    The patient had weekly concurrent chemotherapy with radiation       04/14/2016 -  Radiation Therapy    The patient  had concurrent radiation treatment      05/16/2016 Adverse Reaction    Week 6 of chemotherapy is placed on hold due to pancytopenia.       INTERVAL HISTORY: Please see below for problem oriented charting. She is seen in the treatment room. She complain of fatigue. She also have significant anxiety. The patient denies any recent signs or symptoms of bleeding such as spontaneous epistaxis, hematuria or hematochezia. She denies recent infection, fever, chills or cough. She is compliant taking her potassium replacement but not her magnesium.  REVIEW OF SYSTEMS:   Constitutional: Denies fevers, chills or abnormal weight loss Eyes: Denies blurriness of vision Ears, nose, mouth, throat, and face: Denies mucositis or sore throat Respiratory: Denies cough, dyspnea or wheezes Cardiovascular: Denies palpitation, chest discomfort or lower extremity swelling Gastrointestinal:  Denies nausea, heartburn or change in bowel habits Skin: Denies abnormal skin rashes Lymphatics: Denies new lymphadenopathy or easy bruising Neurological:Denies numbness, tingling or new weaknesses Behavioral/Psych: Mood is stable, no new changes  All other systems were reviewed with the patient and are negative.  I have reviewed the past medical history, past surgical history, social history and family history with the patient and they are unchanged from previous note.  ALLERGIES:  is allergic to codeine; fentanyl; and tramadol.  MEDICATIONS:  Current Outpatient Prescriptions  Medication Sig Dispense Refill  . acetaminophen (TYLENOL) 500 MG tablet Take 500 mg by mouth every 6 (six) hours as needed for mild pain.     Marland Kitchen ALPRAZolam (XANAX) 0.25 MG tablet Take 1 tablet (  0.25 mg total) by mouth 2 (two) times daily as needed for anxiety. 30 tablet 0  . fluticasone (FLONASE) 50 MCG/ACT nasal spray Place 1 spray into both nostrils daily. 16 g 1  . ibuprofen (ADVIL,MOTRIN) 600 MG tablet Take 1 tablet by mouth every 6 hours  for 24 hours and then as needed for moderate to severe pain. (Patient not taking: Reported on 05/24/2016) 30 tablet 0  . lisinopril-hydrochlorothiazide (PRINZIDE) 10-12.5 MG tablet Take 1 tablet by mouth daily. 30 tablet 3  . Magnesium Oxide -Mg Supplement 400 MG CAPS Take 400 mg by mouth daily. Hold if diarrhea. 30 capsule 0  . methadone (DOLOPHINE) 10 MG/5ML solution Take 55 mg by mouth daily.    Marland Kitchen omeprazole (PRILOSEC) 20 MG capsule Take 1 capsule (20 mg total) by mouth daily. 30 capsule 3  . Phenylephrine-APAP-Guaifenesin (SINUS CONGESTION/PAIN SEVERE PO) Take 1 capsule by mouth daily as needed (sinus pain).    . potassium chloride SA (K-DUR,KLOR-CON) 20 MEQ tablet Take 1 tablet (20 mEq total) by mouth 2 (two) times daily. 14 tablet 0  . prochlorperazine (COMPAZINE) 10 MG tablet Take 1 tablet (10 mg total) by mouth every 6 (six) hours as needed for nausea. (Patient not taking: Reported on 05/24/2016) 20 tablet 0  . simethicone (GAS-X) 80 MG chewable tablet 1 tablet by mouth every 6 hours as needed for burping or flatulence. (Patient not taking: Reported on 05/24/2016) 120 tablet 3   No current facility-administered medications for this visit.    Facility-Administered Medications Ordered in Other Visits  Medication Dose Route Frequency Provider Last Rate Last Dose  . magnesium sulfate 4 g in sodium chloride 0.9 % 500 mL  4 g Intravenous Once Heath Lark, MD 169 mL/hr at 05/24/16 1224 4 g at 05/24/16 1224    PHYSICAL EXAMINATION: ECOG PERFORMANCE STATUS: 1 - Symptomatic but completely ambulatory GENERAL:alert, no distress and comfortable SKIN: skin color, texture, turgor are normal, no rashes or significant lesions EYES: normal, Conjunctiva are pink and non-injected, sclera clear OROPHARYNX:no exudate, no erythema and lips, buccal mucosa, and tongue normal  NECK: supple, thyroid normal size, non-tender, without nodularity LYMPH:  no palpable lymphadenopathy in the cervical, axillary or  inguinal LUNGS: clear to auscultation and percussion with normal breathing effort HEART: regular rate & rhythm and no murmurs and no lower extremity edema ABDOMEN:abdomen soft, non-tender and normal bowel sounds Musculoskeletal:no cyanosis of digits and no clubbing  NEURO: alert & oriented x 3 with fluent speech, no focal motor/sensory deficits  LABORATORY DATA:  I have reviewed the data as listed    Component Value Date/Time   NA 143 05/23/2016 1124   K 4.1 05/23/2016 1124   CL 100 (L) 04/08/2016 0630   CO2 32 (H) 05/23/2016 1124   GLUCOSE 86 05/23/2016 1124   BUN 19.5 05/23/2016 1124   CREATININE 1.2 (H) 05/23/2016 1124   CALCIUM 8.4 05/23/2016 1124   PROT 7.7 05/23/2016 1124   ALBUMIN 4.0 05/23/2016 1124   AST 23 05/23/2016 1124   ALT 17 05/23/2016 1124   ALKPHOS 95 05/23/2016 1124   BILITOT 0.62 05/23/2016 1124   GFRNONAA >60 04/08/2016 0630   GFRAA >60 04/08/2016 0630    No results found for: SPEP, UPEP  Lab Results  Component Value Date   WBC 1.5 (L) 05/23/2016   NEUTROABS 1.1 (L) 05/23/2016   HGB 10.6 (L) 05/23/2016   HCT 32.3 (L) 05/23/2016   MCV 83.9 05/23/2016   PLT 108 (L) 05/23/2016  Chemistry      Component Value Date/Time   NA 143 05/23/2016 1124   K 4.1 05/23/2016 1124   CL 100 (L) 04/08/2016 0630   CO2 32 (H) 05/23/2016 1124   BUN 19.5 05/23/2016 1124   CREATININE 1.2 (H) 05/23/2016 1124      Component Value Date/Time   CALCIUM 8.4 05/23/2016 1124   ALKPHOS 95 05/23/2016 1124   AST 23 05/23/2016 1124   ALT 17 05/23/2016 1124   BILITOT 0.62 05/23/2016 1124       RADIOGRAPHIC STUDIES: I have personally reviewed the radiological images as listed and agreed with the findings in the report. Dg Chest 2 View  Result Date: 05/20/2016 CLINICAL DATA:  Belching EXAM: CHEST  2 VIEW COMPARISON:  12/24/2015 FINDINGS: Heart size and vascularity normal.  Negative for pneumonia. Soft tissue density in the posterior costophrenic angle on the lateral  view similar to the prior study. Review of the recent PET-CT 03/29/2016 reveals facet hypertrophy which may account for this density on chest x-ray. No Bochdalek's hernia or lung nodules seen on the prior CT. IMPRESSION: No acute abnormality. Electronically Signed   By: Franchot Gallo M.D.   On: 05/20/2016 14:30    ASSESSMENT & PLAN:   Cervical cancer ([place]) She has started to experience significant side effects from treatment. She had recent severe hypo-magnesium anemia, hypokalemia and pancytopenia. I recommend we focus on supportive care. She has received at least 5 doses of chemotherapy.  Leukopenia due to antineoplastic chemotherapy [facility]) This is due to recent treatment. I will hold her treatment.  Focus on supportive care. We discussed neutropenic precautions  Hypomagnesemia She has very low magnesium level. She will continue to receive intravenous replacement therapy. She will continue magnesium supplement at home. This is related to recent cisplatin  Hypokalemia This is due to hypomagnesemia related to potassium wasting from chemotherapy.  She will continue potassium-rich diet This is stable  Thrombocytopenia ([place]) This is likely due to recent treatment. The patient denies recent history of bleeding such as epistaxis, hematuria or hematochezia. She is asymptomatic from the low platelet count. I will observe for now.    Dehydration She has signs of acute on chronic renal failure. We will give her IV fluid hydration  Continuous tobacco abuse I spent some time counseling the patient the importance of tobacco cessation. We discussed common strategies including nicotine patches, Tobacco Quit-line, and other nicotine replacement products to assist in hereffort to quit  she appears motivated to quit.   Anxiety She complains of problem with anxiety.  She denies depression.  She is taking Xanax as needed. We discussed potential long-term medications such as SSRI.  The patient  declines. We will get our social worker to talk to the patient first before referring her to a psychiatrist or psychologist.  She agreed    All questions were answered. The patient knows to call the clinic with any problems, questions or concerns. No barriers to learning was detected. I spent 25 minutes counseling the patient face to face. The total time spent in the appointment was 40 minutes and more than 50% was on counseling and review of test results     Heath Lark, MD 05/24/2016 2:38 PM

## 2016-05-24 NOTE — Progress Notes (Signed)
Valerie Kerr has completed 27 fractions to her pelvis.  She denies having pain but has had trouble with belching.  It was recommended that she start taking Gas-X by her primary care doctor.  She denies having any bladder issues.  She reports having some rectal irritation.  She has a blister on both sides of her rectal area.  She has been given a sitz bath.  She denies having any vaginal bleeding.  She denies having diarrhea.    BP 124/74 (BP Location: Left Arm, Patient Position: Sitting)   Pulse 95   Temp 98.5 F (36.9 C) (Oral)   Ht 5\' 1"  (1.549 m)   Wt 144 lb 9.6 oz (65.6 kg)   SpO2 100%   BMI 27.32 kg/m    Wt Readings from Last 3 Encounters:  05/24/16 144 lb 9.6 oz (65.6 kg)  05/23/16 145 lb 3.2 oz (65.9 kg)  05/20/16 149 lb (67.6 kg)

## 2016-05-24 NOTE — Patient Instructions (Addendum)
Dehydration, Adult Dehydration is a condition in which there is not enough fluid or water in the body. This happens when you lose more fluids than you take in. Important organs, such as the kidneys, brain, and heart, cannot function without a proper amount of fluids. Any loss of fluids from the body can lead to dehydration. Dehydration can range from mild to severe. This condition should be treated right away to prevent it from becoming severe. What are the causes? This condition may be caused by:  Vomiting.  Diarrhea.  Excessive sweating, such as from heat exposure or exercise.  Not drinking enough fluid, especially:  When ill.  While doing activity that requires a lot of energy.  Excessive urination.  Fever.  Infection.  Certain medicines, such as medicines that cause the body to lose excess fluid (diuretics).  Inability to access safe drinking water.  Reduced physical ability to get adequate water and food. What increases the risk? This condition is more likely to develop in people:  Who have a poorly controlled long-term (chronic) illness, such as diabetes, heart disease, or kidney disease.  Who are age 65 or older.  Who are disabled.  Who live in a place with high altitude.  Who play endurance sports. What are the signs or symptoms? Symptoms of mild dehydration may include:   Thirst.  Dry lips.  Slightly dry mouth.  Dry, warm skin.  Dizziness. Symptoms of moderate dehydration may include:   Very dry mouth.  Muscle cramps.  Dark urine. Urine may be the color of tea.  Decreased urine production.  Decreased tear production.  Heartbeat that is irregular or faster than normal (palpitations).  Headache.  Light-headedness, especially when you stand up from a sitting position.  Fainting (syncope). Symptoms of severe dehydration may include:   Changes in skin, such as:  Cold and clammy skin.  Blotchy (mottled) or pale skin.  Skin that does  not quickly return to normal after being lightly pinched and released (poor skin turgor).  Changes in body fluids, such as:  Extreme thirst.  No tear production.  Inability to sweat when body temperature is high, such as in hot weather.  Very little urine production.  Changes in vital signs, such as:  Weak pulse.  Pulse that is more than 100 beats a minute when sitting still.  Rapid breathing.  Low blood pressure.  Other changes, such as:  Sunken eyes.  Cold hands and feet.  Confusion.  Lack of energy (lethargy).  Difficulty waking up from sleep.  Short-term weight loss.  Unconsciousness. How is this diagnosed? This condition is diagnosed based on your symptoms and a physical exam. Blood and urine tests may be done to help confirm the diagnosis. How is this treated? Treatment for this condition depends on the severity. Mild or moderate dehydration can often be treated at home. Treatment should be started right away. Do not wait until dehydration becomes severe. Severe dehydration is an emergency and it needs to be treated in a hospital. Treatment for mild dehydration may include:   Drinking more fluids.  Replacing salts and minerals in your blood (electrolytes) that you may have lost. Treatment for moderate dehydration may include:   Drinking an oral rehydration solution (ORS). This is a drink that helps you replace fluids and electrolytes (rehydrate). It can be found at pharmacies and retail stores. Treatment for severe dehydration may include:   Receiving fluids through an IV tube.  Receiving an electrolyte solution through a feeding tube that is   passed through your nose and into your stomach (nasogastric tube, or NG tube).  Correcting any abnormalities in electrolytes.  Treating the underlying cause of dehydration. Follow these instructions at home:  If directed by your health care provider, drink an ORS:  Make an ORS by following instructions on the  package.  Start by drinking small amounts, about  cup (120 mL) every 5-10 minutes.  Slowly increase how much you drink until you have taken the amount recommended by your health care provider.  Drink enough clear fluid to keep your urine clear or pale yellow. If you were told to drink an ORS, finish the ORS first, then start slowly drinking other clear fluids. Drink fluids such as:  Water. Do not drink only water. Doing that can lead to having too little salt (sodium) in the body (hyponatremia).  Ice chips.  Fruit juice that you have added water to (diluted fruit juice).  Low-calorie sports drinks.  Avoid:  Alcohol.  Drinks that contain a lot of sugar. These include high-calorie sports drinks, fruit juice that is not diluted, and soda.  Caffeine.  Foods that are greasy or contain a lot of fat or sugar.  Take over-the-counter and prescription medicines only as told by your health care provider.  Do not take sodium tablets. This can lead to having too much sodium in the body (hypernatremia).  Eat foods that contain a healthy balance of electrolytes, such as bananas, oranges, potatoes, tomatoes, and spinach.  Keep all follow-up visits as told by your health care provider. This is important. Contact a health care provider if:  You have abdominal pain that:  Gets worse.  Stays in one area (localizes).  You have a rash.  You have a stiff neck.  You are more irritable than usual.  You are sleepier or more difficult to wake up than usual.  You feel weak or dizzy.  You feel very thirsty.  You have urinated only a small amount of very dark urine over 6-8 hours. Get help right away if:  You have symptoms of severe dehydration.  You cannot drink fluids without vomiting.  Your symptoms get worse with treatment.  You have a fever.  You have a severe headache.  You have vomiting or diarrhea that:  Gets worse.  Does not go away.  You have blood or green matter  (bile) in your vomit.  You have blood in your stool. This may cause stool to look black and tarry.  You have not urinated in 6-8 hours.  You faint.  Your heart rate while sitting still is over 100 beats a minute.  You have trouble breathing. This information is not intended to replace advice given to you by your health care provider. Make sure you discuss any questions you have with your health care provider. Document Released: 03/14/2005 Document Revised: 10/09/2015 Document Reviewed: 05/08/2015 Elsevier Interactive Patient Education  2017 Else[employer]   Magnesium Sulfate injection What is this medicine? MAGNESIUM SULFATE (mag NEE zee um SUL fate) is an electrolyte injection commonly used to treat low magnesium levels in your blood. It is also used to prevent or control seizures in women with preeclampsia or eclampsia. This medicine may be used for other purposes; ask your health care provider or pharmacist if you have questions. What should I tell my health care provider before I take this medicine? They need to know if you have any of these conditions: -heart disease -history of irregular heart beat -kidney disease -an unusual or  allergic reaction to magnesium sulfate, medicines, foods, dyes, or preservatives -pregnant or trying to get pregnant -breast-feeding How should I use this medicine? This medicine is for infusion into a vein. It is given by a health care professional in a hospital or clinic setting. Talk to your pediatrician regarding the use of this medicine in children. While this drug may be prescribed for selected conditions, precautions do apply. Overdosage: If you think you have taken too much of this medicine contact a poison control center or emergency room at once. NOTE: This medicine is only for you. Do not share this medicine with others. What if I miss a dose? This does not apply. What may interact with this medicine? This medicine may interact with the  following medications: -certain medicines for anxiety or sleep -certain medicines for seizures like phenobarbital -digoxin -medicines that relax muscles for surgery -narcotic medicines for pain This list may not describe all possible interactions. Give your health care provider a list of all the medicines, herbs, non-prescription drugs, or dietary supplements you use. Also tell them if you smoke, drink alcohol, or use illegal drugs. Some items may interact with your medicine. What should I watch for while using this medicine? Your condition will be monitored carefully while you are receiving this medicine. You may need blood work done while you are receiving this medicine. What side effects may I notice from receiving this medicine? Side effects that you should report to your doctor or health care professional as soon as possible: -allergic reactions like skin rash, itching or hives, swelling of the face, lips, or tongue -facial flushing -muscle weakness -signs and symptoms of low blood pressure like dizziness; feeling faint or lightheaded, falls; unusually weak or tired -signs and symptoms of a dangerous change in heartbeat or heart rhythm like chest pain; dizziness; fast or irregular heartbeat; palpitations; breathing problems -sweating This list may not describe all possible side effects. Call your doctor for medical advice about side effects. You may report side effects to FDA at 1-800-FDA-1088. Where should I keep my medicine? This drug is given in a hospital or clinic and will not be stored at home. NOTE: This sheet is a summary. It may not cover all possible information. If you have questions about this medicine, talk to your doctor, pharmacist, or health care provider.  2018 Elsevier/Gold Standard (2015-09-30 12:31:42)

## 2016-05-25 ENCOUNTER — Other Ambulatory Visit: Payer: Self-pay | Admitting: Radiation Oncology

## 2016-05-25 ENCOUNTER — Ambulatory Visit
Admission: RE | Admit: 2016-05-25 | Discharge: 2016-05-25 | Disposition: A | Discharge status: Home / Self Care | Payer: Medicaid Other | Source: Ambulatory Visit | Attending: Radiation Oncology | Admitting: Radiation Oncology

## 2016-05-25 DIAGNOSIS — C539 Malignant neoplasm of cervix uteri, unspecified: Secondary | ICD-10-CM

## 2016-05-25 NOTE — H&P (Signed)
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  HISTORY OF PRESENT ILLNESS::Valerie Kerr is a 55 y.o. female who is currently under treatment with external beam radiation therapy and radiosensitizing chemotherapy. The patient has completed approximately 45 gray and will now proceed with intracavitary brachytherapy treatments as part of her overall management..    PAST MEDICAL HISTORY:  has a past medical history of Acid reflux; Anxiety; Cervical cancer (HCC); Chronic pain disorder; Dental caries; Environmental allergies; Headache; Herniated disc, cervical; and Hypertension.    PAST SURGICAL HISTORY: Past Surgical History:  Procedure Laterality Date  . ABCESS DRAINAGE Left 1982   breast  . CARPAL TUNNEL RELEASE Bilateral   . CERVICAL DISC SURGERY  2010  . MULTIPLE EXTRACTIONS WITH ALVEOLOPLASTY N/A 04/08/2016   Procedure: Extraction of tooth #'s 3,9,21,30,31 with alveoloplasty  and gross debridement of remaining teeth;  Surgeon: Ronald F Kulinski, DDS;  Location: [facility] OR;  Service: Oral Surgery;  Laterality: N/A;    FAMILY HISTORY: family history includes Cancer in her brother, brother, father, and sister.  SOCIAL HISTORY:  reports that she has been smoking Cigarettes.  She has a 11.25 pack-year smoking history. She has never used smokeless tobacco. She reports that she does not drink alcohol or use drugs.  ALLERGIES: Codeine; Fentanyl; and Tramadol  MEDICATIONS:  No current facility-administered medications for this encounter.    Current Outpatient Prescriptions  Medication Sig Dispense Refill  . acetaminophen (TYLENOL) 500 MG tablet Take 500 mg by mouth every 6 (six) hours as needed for mild pain.     . ALPRAZolam (XANAX) 0.25 MG tablet Take 1 tablet (0.25 mg  total) by mouth 2 (two) times daily as needed for anxiety. 30 tablet 0  . fluticasone (FLONASE) 50 MCG/ACT nasal spray Place 1 spray into both nostrils daily. 16 g 1  . ibuprofen (ADVIL,MOTRIN) 600 MG tablet Take 1 tablet by mouth every 6 hours for 24 hours and then as needed for moderate to severe pain. (Patient not taking: Reported on 05/24/2016) 30 tablet 0  . lisinopril-hydrochlorothiazide (PRINZIDE) 10-12.5 MG tablet Take 1 tablet by mouth daily. 30 tablet 3  . Magnesium Oxide -Mg Supplement 400 MG CAPS Take 400 mg by mouth daily. Hold if diarrhea. 30 capsule 0  . methadone (DOLOPHINE) 10 MG/5ML solution Take 55 mg by mouth daily.    . omeprazole (PRILOSEC) 20 MG capsule Take 1 capsule (20 mg total) by mouth daily. 30 capsule 3  . Phenylephrine-APAP-Guaifenesin (SINUS CONGESTION/PAIN SEVERE PO) Take 1 capsule by mouth daily as needed (sinus pain).    . potassium chloride SA (K-DUR,KLOR-CON) 20 MEQ tablet Take 1 tablet (20 mEq total) by mouth 2 (two) times daily. 14 tablet 0  . prochlorperazine (COMPAZINE) 10 MG tablet Take 1 tablet (10 mg total) by mouth every 6 (six) hours as needed for nausea. (Patient not taking: Reported on 05/24/2016) 20 tablet 0  . simethicone (GAS-X) 80 MG chewable tablet 1 tablet by mouth every 6 hours as needed for burping or flatulence. (Patient not taking: Reported on 05/24/2016) 120 tablet 3    REVIEW OF SYSTEMS:  A 15 point review of systems is documented in the electronic medical record. This was obtained by the nursing staff. However, I reviewed this with the patient to discuss relevant findings and make appropriate   changes.    PHYSICAL EXAM:   height is 5' 1" (1.549 m) and weight is 144 lb 9.6 oz (65.6 kg). Her oral temperature is 98.5 F (36.9 C). Her blood pressure is 124/74 and her pulse is 95. Her oxygen saturation is 100%.   General: Alert and oriented, in no acute distress HEENT: Head is normocephalic. Extraocular movements are intact. Oropharynx is  clear. Neck: Neck is supple, no palpable cervical or supraclavicular lymphadenopathy. Heart: Regular in rate and rhythm with no murmurs, rubs, or gallops. Chest: Clear to auscultation bilaterally, with no rhonchi, wheezes, or rales. Abdomen: Soft, nontender, nondistended, with no rigidity or guarding. Extremities: No cyanosis or edema. Lymphatics: see Neck Exam Skin: No concerning lesions. Musculoskeletal: symmetric strength and muscle tone throughout. Neurologic: Cranial nerves II through XII are grossly intact. No obvious focalities. Speech is fluent. Coordination is intact. Psychiatric: Judgment and insight are intact. Affect is appropriate. Pelvic exam to be performed in the operating room pretreatment pelvic exam showed the cervical mass to be approximately 6 cm with bilateral parametrial involvement    LABORATORY DATA:  Lab Results  Component Value Date   WBC 1.5 (L) 05/23/2016   HGB 10.6 (L) 05/23/2016   HCT 32.3 (L) 05/23/2016   MCV 83.9 05/23/2016   PLT 108 (L) 05/23/2016   NEUTROABS 1.1 (L) 05/23/2016   Lab Results  Component Value Date   NA 143 05/23/2016   K 4.1 05/23/2016   CL 100 (L) 04/08/2016   CO2 32 (H) 05/23/2016   GLUCOSE 86 05/23/2016   CREATININE 1.2 (H) 05/23/2016   CALCIUM 8.4 05/23/2016      RADIOGRAPHY: Dg Chest 2 View  Result Date: 05/20/2016 CLINICAL DATA:  Belching EXAM: CHEST  2 VIEW COMPARISON:  12/24/2015 FINDINGS: Heart size and vascularity normal.  Negative for pneumonia. Soft tissue density in the posterior costophrenic angle on the lateral view similar to the prior study. Review of the recent PET-CT 03/29/2016 reveals facet hypertrophy which may account for this density on chest x-ray. No Bochdalek's hernia or lung nodules seen on the prior CT. IMPRESSION: No acute abnormality. Electronically Signed   By: Charles  Clark M.D.   On: 05/20/2016 14:30      IMPRESSION: Stage II-b squamous cell carcinoma cervix. Patient is now ready to proceed  with intracavitary brachytherapy treatments.  PLAN: Patient will be taken to the operating room and placed under general anesthetic. She will undergo exam under anesthesia and proceed with dilation of the cervical os and placement of tandem ring applicator in preparation for high-dose rate radiation therapy with iridium 192 as the high-dose-rate source. She receive the 5 intracavitary brachytherapy treatments.     ------------------------------------------------  Khori Underberg D. Zayanna Pundt, PhD, MD        

## 2016-05-25 NOTE — Anesthesia Preprocedure Evaluation (Addendum)
Anesthesia Evaluation  Patient identified by MRN, date of birth, ID band Patient awake    Reviewed: Allergy & Precautions, NPO status , Patient's Chart, lab work & pertinent test results  Airway Mallampati: II  TM Distance: >3 FB Neck ROM: Full    Dental no notable dental hx. (+) Teeth Intact, Dental Advisory Given, Poor Dentition,    Pulmonary Current Smoker,    Pulmonary exam normal breath sounds clear to auscultation       Cardiovascular hypertension, Pt. on medications Normal cardiovascular exam Rhythm:Regular Rate:Normal     Neuro/Psych Anxiety negative neurological ROS  negative psych ROS   GI/Hepatic Neg liver ROS, GERD  Medicated and Controlled,  Endo/Other  negative endocrine ROS  Renal/GU negative Renal ROS  negative genitourinary   Musculoskeletal negative musculoskeletal ROS (+)   Abdominal   Peds negative pediatric ROS (+)  Hematology  (+) anemia , thrombocytopenia   Anesthesia Other Findings Cervical CA  Reproductive/Obstetrics negative OB ROS                           Anesthesia Physical Anesthesia Plan  ASA: III  Anesthesia Plan: General   Post-op Pain Management:    Induction: Intravenous  Airway Management Planned: LMA and Oral ETT  Additional Equipment:   Intra-op Plan:   Post-operative Plan: Extubation in OR  Informed Consent: I have reviewed the patients History and Physical, chart, labs and discussed the procedure including the risks, benefits and alternatives for the proposed anesthesia with the patient or authorized representative who has indicated his/her understanding and acceptance.   Dental advisory given  Plan Discussed with: CRNA and Surgeon  Anesthesia Plan Comments:         Anesthesia Quick Evaluation

## 2016-05-26 ENCOUNTER — Encounter (HOSPITAL_BASED_OUTPATIENT_CLINIC_OR_DEPARTMENT_OTHER)
Admission: RE | Disposition: A | Discharge status: Still In Hospital | Payer: Self-pay | Source: Ambulatory Visit | Attending: Radiation Oncology

## 2016-05-26 ENCOUNTER — Ambulatory Visit (HOSPITAL_BASED_OUTPATIENT_CLINIC_OR_DEPARTMENT_OTHER): Payer: Self-pay | Admitting: Anesthesiology

## 2016-05-26 ENCOUNTER — Ambulatory Visit: Payer: Medicaid Other

## 2016-05-26 ENCOUNTER — Encounter (HOSPITAL_BASED_OUTPATIENT_CLINIC_OR_DEPARTMENT_OTHER): Payer: Self-pay

## 2016-05-26 ENCOUNTER — Ambulatory Visit (HOSPITAL_COMMUNITY)
Admission: RE | Admit: 2016-05-26 | Discharge: 2016-05-26 | Disposition: A | Discharge status: Home / Self Care | Payer: Self-pay | Source: Ambulatory Visit | Attending: Radiation Oncology | Admitting: Radiation Oncology

## 2016-05-26 ENCOUNTER — Ambulatory Visit (HOSPITAL_BASED_OUTPATIENT_CLINIC_OR_DEPARTMENT_OTHER)
Admission: RE | Admit: 2016-05-26 | Discharge: 2016-05-26 | Disposition: A | Discharge status: Still In Hospital | Payer: Self-pay | Source: Ambulatory Visit | Attending: Radiation Oncology | Admitting: Radiation Oncology

## 2016-05-26 ENCOUNTER — Ambulatory Visit
Admission: RE | Admit: 2016-05-26 | Discharge: 2016-05-26 | Disposition: A | Discharge status: Home / Self Care | Payer: Medicaid Other | Source: Ambulatory Visit | Attending: Radiation Oncology | Admitting: Radiation Oncology

## 2016-05-26 ENCOUNTER — Encounter (HOSPITAL_BASED_OUTPATIENT_CLINIC_OR_DEPARTMENT_OTHER): Payer: Self-pay | Admitting: *Deleted

## 2016-05-26 VITALS — BP 146/80 | HR 85

## 2016-05-26 DIAGNOSIS — C539 Malignant neoplasm of cervix uteri, unspecified: Secondary | ICD-10-CM

## 2016-05-26 DIAGNOSIS — Z79899 Other long term (current) drug therapy: Secondary | ICD-10-CM | POA: Insufficient documentation

## 2016-05-26 DIAGNOSIS — G8929 Other chronic pain: Secondary | ICD-10-CM | POA: Insufficient documentation

## 2016-05-26 DIAGNOSIS — F419 Anxiety disorder, unspecified: Secondary | ICD-10-CM | POA: Insufficient documentation

## 2016-05-26 DIAGNOSIS — F17210 Nicotine dependence, cigarettes, uncomplicated: Secondary | ICD-10-CM | POA: Insufficient documentation

## 2016-05-26 DIAGNOSIS — K219 Gastro-esophageal reflux disease without esophagitis: Secondary | ICD-10-CM | POA: Insufficient documentation

## 2016-05-26 DIAGNOSIS — Z791 Long term (current) use of non-steroidal anti-inflammatories (NSAID): Secondary | ICD-10-CM | POA: Insufficient documentation

## 2016-05-26 DIAGNOSIS — I10 Essential (primary) hypertension: Secondary | ICD-10-CM | POA: Insufficient documentation

## 2016-05-26 HISTORY — PX: TANDEM RING INSERTION: SHX6199

## 2016-05-26 HISTORY — DX: Chronic pain syndrome: G89.4

## 2016-05-26 SURGERY — INSERTION, UTERINE TANDEM AND RING OR CYLINDER, FOR BRACHYTHERAPY
Anesthesia: General | Site: Cervix

## 2016-05-26 MED ORDER — ESTRADIOL 0.1 MG/GM VA CREA
TOPICAL_CREAM | VAGINAL | Status: AC
  Filled 2016-05-26: qty 42.5

## 2016-05-26 MED ORDER — LIDOCAINE HCL 1 % IJ SOLN
INTRAMUSCULAR | Status: AC
  Filled 2016-05-26: qty 20

## 2016-05-26 MED ORDER — LIDOCAINE 2% (20 MG/ML) 5 ML SYRINGE
INTRAMUSCULAR | Status: AC
  Filled 2016-05-26: qty 5

## 2016-05-26 MED ORDER — PROPOFOL 10 MG/ML IV BOLUS
INTRAVENOUS | Status: AC
  Filled 2016-05-26: qty 40

## 2016-05-26 MED ORDER — ACETAMINOPHEN 10 MG/ML IV SOLN
INTRAVENOUS | Status: DC | PRN
  Administered 2016-05-26: 1000 mg via INTRAVENOUS

## 2016-05-26 MED ORDER — HYDROMORPHONE HCL 1 MG/ML IJ SOLN
0.5000 mg | Freq: Once | INTRAMUSCULAR | Status: AC
  Administered 2016-05-26: 0.5 mg via INTRAVENOUS
  Filled 2016-05-26: qty 1

## 2016-05-26 MED ORDER — GABAPENTIN 300 MG PO CAPS
ORAL_CAPSULE | ORAL | Status: AC
  Filled 2016-05-26: qty 1

## 2016-05-26 MED ORDER — MIDAZOLAM HCL 5 MG/5ML IJ SOLN
INTRAMUSCULAR | Status: DC | PRN
  Administered 2016-05-26: 2 mg via INTRAVENOUS
  Administered 2016-05-26 (×2): 1 mg via INTRAVENOUS

## 2016-05-26 MED ORDER — LACTATED RINGERS IV SOLN
INTRAVENOUS | Status: DC
  Filled 2016-05-26 (×2): qty 250

## 2016-05-26 MED ORDER — GABAPENTIN 800 MG PO TABS
400.0000 mg | ORAL_TABLET | Freq: Once | ORAL | Status: DC
  Filled 2016-05-26: qty 0.5

## 2016-05-26 MED ORDER — DEXAMETHASONE SODIUM PHOSPHATE 4 MG/ML IJ SOLN
INTRAMUSCULAR | Status: DC | PRN
  Administered 2016-05-26: 10 mg via INTRAVENOUS

## 2016-05-26 MED ORDER — HYDROMORPHONE HCL 4 MG PO TABS: 4.0000 mg | ORAL_TABLET | ORAL | 0 refills | Status: DC | PRN

## 2016-05-26 MED ORDER — ONDANSETRON HCL 4 MG/2ML IJ SOLN
INTRAMUSCULAR | Status: DC | PRN
  Administered 2016-05-26: 4 mg via INTRAVENOUS

## 2016-05-26 MED ORDER — ALBUTEROL SULFATE HFA 108 (90 BASE) MCG/ACT IN AERS
INHALATION_SPRAY | RESPIRATORY_TRACT | Status: AC
  Filled 2016-05-26: qty 6.7

## 2016-05-26 MED ORDER — WATER FOR IRRIGATION, STERILE IR SOLN
Status: DC | PRN
  Administered 2016-05-26: 3000 mL via INTRAVESICAL

## 2016-05-26 MED ORDER — FENTANYL CITRATE (PF) 100 MCG/2ML IJ SOLN
25.0000 ug | INTRAMUSCULAR | Status: DC | PRN
  Administered 2016-05-26 (×2): 50 ug via INTRAVENOUS
  Filled 2016-05-26: qty 1

## 2016-05-26 MED ORDER — HYDROMORPHONE HCL 4 MG/ML IJ SOLN
INTRAMUSCULAR | Status: AC
  Filled 2016-05-26: qty 1

## 2016-05-26 MED ORDER — ALBUTEROL SULFATE HFA 108 (90 BASE) MCG/ACT IN AERS
INHALATION_SPRAY | RESPIRATORY_TRACT | Status: DC | PRN
  Administered 2016-05-26 (×2): 4 via RESPIRATORY_TRACT

## 2016-05-26 MED ORDER — PROPOFOL 10 MG/ML IV BOLUS
INTRAVENOUS | Status: DC | PRN
  Administered 2016-05-26: 150 mg via INTRAVENOUS

## 2016-05-26 MED ORDER — MIDAZOLAM HCL 2 MG/2ML IJ SOLN
INTRAMUSCULAR | Status: AC
  Filled 2016-05-26: qty 4

## 2016-05-26 MED ORDER — FENTANYL CITRATE (PF) 100 MCG/2ML IJ SOLN
INTRAMUSCULAR | Status: AC
  Filled 2016-05-26: qty 2

## 2016-05-26 MED ORDER — ACETAMINOPHEN 10 MG/ML IV SOLN
INTRAVENOUS | Status: AC
  Filled 2016-05-26: qty 100

## 2016-05-26 MED ORDER — HYDROMORPHONE HCL 1 MG/ML IJ SOLN
INTRAMUSCULAR | Status: DC | PRN
  Administered 2016-05-26: 1 mg via INTRAVENOUS

## 2016-05-26 MED ORDER — LACTATED RINGERS IV SOLN
INTRAVENOUS | Status: DC
  Administered 2016-05-26 (×2): via INTRAVENOUS
  Filled 2016-05-26: qty 1000

## 2016-05-26 MED ORDER — LIDOCAINE 2% (20 MG/ML) 5 ML SYRINGE
INTRAMUSCULAR | Status: DC | PRN
Start: 2016-05-26 — End: 2016-05-26
  Administered 2016-05-26: 80 mg via INTRAVENOUS

## 2016-05-26 MED ORDER — ONDANSETRON HCL 4 MG/2ML IJ SOLN
INTRAMUSCULAR | Status: AC
  Filled 2016-05-26: qty 2

## 2016-05-26 MED ORDER — KETOROLAC TROMETHAMINE 30 MG/ML IJ SOLN
INTRAMUSCULAR | Status: AC
  Filled 2016-05-26: qty 1

## 2016-05-26 MED ORDER — KETOROLAC TROMETHAMINE 30 MG/ML IJ SOLN
INTRAMUSCULAR | Status: DC | PRN
  Administered 2016-05-26: 30 mg via INTRAVENOUS

## 2016-05-26 MED ORDER — PROMETHAZINE HCL 25 MG/ML IJ SOLN
6.2500 mg | INTRAMUSCULAR | Status: DC | PRN
  Filled 2016-05-26: qty 1

## 2016-05-26 MED ORDER — HYDROMORPHONE HCL 1 MG/ML IJ SOLN
1.0000 mg | Freq: Once | INTRAMUSCULAR | Status: AC
  Administered 2016-05-26: 1 mg via INTRAVENOUS
  Filled 2016-05-26: qty 1

## 2016-05-26 MED ORDER — DEXAMETHASONE SODIUM PHOSPHATE 10 MG/ML IJ SOLN
INTRAMUSCULAR | Status: AC
  Filled 2016-05-26: qty 1

## 2016-05-26 MED FILL — HYDROmorphone HCL 4 MG TABS: 4 | 2 days supply | Qty: 15 | Fill #0

## 2016-05-26 SURGICAL SUPPLY — 35 items
BAG URINE DRAINAGE (UROLOGICAL SUPPLIES) ×3 IMPLANT
BNDG CONFORM 2 STRL LF (GAUZE/BANDAGES/DRESSINGS) IMPLANT
CATH FOLEY 2WAY SLVR  5CC 16FR (CATHETERS) ×2
CATH FOLEY 2WAY SLVR 5CC 16FR (CATHETERS) ×1 IMPLANT
COVER BACK TABLE 60X90IN (DRAPES) ×3 IMPLANT
DRAPE LG THREE QUARTER DISP (DRAPES) ×3 IMPLANT
DRAPE UNDERBUTTOCKS STRL (DRAPE) ×3 IMPLANT
GLOVE BIO SURGEON STRL SZ7.5 (GLOVE) ×6 IMPLANT
GOWN STRL REUS W/ TWL LRG LVL3 (GOWN DISPOSABLE) ×2 IMPLANT
GOWN STRL REUS W/TWL LRG LVL3 (GOWN DISPOSABLE) ×6
HOLDER FOLEY CATH W/STRAP (MISCELLANEOUS) ×3 IMPLANT
KIT RM TURNOVER CYSTO AR (KITS) ×3 IMPLANT
LEGGING LITHOTOMY PAIR STRL (DRAPES) ×3 IMPLANT
MANIFOLD NEPTUNE II (INSTRUMENTS) IMPLANT
NDL SPNL 22GX3.5 QUINCKE BK (NEEDLE) IMPLANT
NEEDLE SPNL 22GX3.5 QUINCKE BK (NEEDLE) ×3 IMPLANT
PACK BASIN DAY SURGERY FS (CUSTOM PROCEDURE TRAY) ×3 IMPLANT
PACKING VAGINAL (PACKING) IMPLANT
PAD ABD 8X10 STRL (GAUZE/BANDAGES/DRESSINGS) ×3 IMPLANT
PAD OB MATERNITY 4.3X12.25 (PERSONAL CARE ITEMS) ×2 IMPLANT
PAD PREP 24X48 CUFFED NSTRL (MISCELLANEOUS) ×3 IMPLANT
PLUG CATH AND CAP STER (CATHETERS) ×2 IMPLANT
SET IRRIG Y TYPE TUR BLADDER L (SET/KITS/TRAYS/PACK) ×3 IMPLANT
SUT PROLENE 0 SH 30 (SUTURE) IMPLANT
SUT SILK 2 0 30  PSL (SUTURE)
SUT SILK 2 0 30 PSL (SUTURE) IMPLANT
SYR BULB IRRIGATION 50ML (SYRINGE) IMPLANT
SYR CONTROL 10ML LL (SYRINGE) ×2 IMPLANT
SYRINGE 10CC LL (SYRINGE) ×3 IMPLANT
TOWEL OR 17X24 6PK STRL BLUE (TOWEL DISPOSABLE) ×4 IMPLANT
TRAY DSU PREP LF (CUSTOM PROCEDURE TRAY) ×3 IMPLANT
TUBE CONNECTING 12'X1/4 (SUCTIONS)
TUBE CONNECTING 12X1/4 (SUCTIONS) IMPLANT
WATER STERILE IRR 3000ML UROMA (IV SOLUTION) ×3 IMPLANT
WATER STERILE IRR 500ML POUR (IV SOLUTION) ×1 IMPLANT

## 2016-05-26 NOTE — Progress Notes (Signed)
Patient reports having pain at an 8/10. 1 mg dilaudid given IV per Dr. Sondra Come.  Will continue to monitor.

## 2016-05-26 NOTE — Transfer of Care (Signed)
Immediate Anesthesia Transfer of Care Note  Patient: Valerie Kerr  Procedure(s) Performed: Procedure(s): TANDEM RING INSERTION (N/A)  Patient Location: PACU  Anesthesia Type:General  Level of Consciousness: awake, alert , oriented and patient cooperative  Airway & Oxygen Therapy: Patient Spontanous Breathing and Patient connected to face mask oxygen  Post-op Assessment: Report given to RN and Post -op Vital signs reviewed and stable  Post vital signs: Reviewed and stable  Last Vitals:  Vitals:   05/26/16 0625 05/26/16 0903  BP: (!) 141/82 (!) 147/81  Pulse: 92 (!) 102  Resp: 16 11  Temp: 37.1 C 37.8 C    Last Pain:  Vitals:   05/26/16 0625  TempSrc: Oral      Patients Stated Pain Goal: 8 ([id number])  Complications: No apparent anesthesia complications

## 2016-05-26 NOTE — Brief Op Note (Signed)
05/26/2016  6:22 PM  PATIENT:  Valerie Kerr  56 y.o. female  PRE-OPERATIVE DIAGNOSIS:  CERVICAL CANCER  POST-OPERATIVE DIAGNOSIS:  CERVICAL CANCER  PROCEDURE:  Procedure(s): TANDEM RING INSERTION (N/A)  SURGEON:  Surgeon(s) and Role:    * [name], MD - Primary  PHYSICIAN ASSISTANT:   ASSISTANTS: none   ANESTHESIA:   general  EBL:  Total I/O In: 1060 [P.O.:60; I.V.:1000] Out: 400 [Urine:400]  BLOOD ADMINISTERED:none  DRAINS: Urinary Catheter (Foley)   LOCAL MEDICATIONS USED:  NONE  SPECIMEN:  No Specimen  DISPOSITION OF SPECIMEN:  N/A  COUNTS:  YES  TOURNIQUET:  * No tourniquets in log *  DICTATION: The patient was taken to the outpatient OR #1. Timeout was performed for the procedure, length of anesthesia, preoperative antibiotics.  She was prepped and draped in the usual sterile fashion and placed in the dorsal lithotomy position. Exam under anesthesia revealed excellent response to her external beam radiation therapy and radiosensitizing chemotherapy. The cervical mass was estimated to be approximately 3 x 3 cm in size. There was no parametrial extension noted on exam. The cervical mass continued to protrude into the upper vaginal fault was not a attached to the upper vaginal mucosa. The bladder was filled with approximately 200 mL of sterile water to aid in ultrasound imaging purposes .Patient proceeded to undergo dilation and sounding. The uterus was noted to be quite thin and was noted to be an anteverted position with intraoperative ultrasound imaging. The uterus sounded to approximately 7-8 cm. Despite several attempts with dilation a cervical sleeve could not be placed. The patient then had placement of a 30 60 mm tandem within the uterine canal. Intraoperative ultrasound images verified good placement. The uterus was noted to be deviated significantly to the left of the patient.  thr patient then had placement of a 30 ring attached to the tandem. Patient then  had placement of a rectal paddle to push the rectal mucosa out of the high-dose radiation field. Intraoperative ultrasound at the end of the procedure verified accurate  placement of the equipment. Patient tolerated the procedure well. The patient was transported to recovery room in stable condition. Later in the day the patient will be transported to radiation oncology for planning and her first high-dose-rate intracavitary brachytherapy treatment with iridium 192 as the high-dose-rate source.  PLAN OF CARE: Transferred to radiation oncology for planning and treatment  PATIENT DISPOSITION:  PACU - hemodynamically stable.   Delay start of Pharmacological VTE agent (>24hrs) due to surgical blood loss or risk of bleeding: not applicable

## 2016-05-26 NOTE — Progress Notes (Signed)
  Radiation Oncology         [phone] ________________________________  Name: Valerie Kerr MRN: [id number]  Date: 05/26/2016  DOB: 07-25-1960  SIMULATION AND TREATMENT PLANNING NOTE HDR BRACHYTHERAPY  DIAGNOSIS:  stage II-Bpoorly differentiated squamous cell carcinoma of the cervix.  NARRATIVE:  The patient was brought to the [place].  Identity was confirmed.  All relevant records and images related to the planned course of therapy were reviewed.  The patient freely provided informed written consent to proceed with treatment after reviewing the details related to the planned course of therapy. The consent form was witnessed and verified by the simulation staff.  Then, the patient was set-up in a stable reproducible  supine position for radiation therapy.  CT images were obtained.  Surface markings were placed.  The CT images were loaded into the planning software.  Then the target and avoidance structures were contoured.  Treatment planning then occurred.  The radiation prescription was entered and confirmed.   I have requested : Brachytherapy Isodose Plan and Dosimetry Calculations to plan the radiation distribution.    PLAN:  The patient will receive 5.5 Gy in 1 fraction directed at the high risk CTV.  Iridium 192 will be the high-dose-rate source.    ________________________________  [name], PhD, MD

## 2016-05-26 NOTE — Interval H&P Note (Signed)
History and Physical Interval Note:  05/26/2016 7:31 AM  Valerie Kerr  has presented today for surgery, with the diagnosis of CERVICAL CANCER  The various methods of treatment have been discussed with the patient and family. After consideration of risks, benefits and other options for treatment, the patient has consented to  Procedure(s): TANDEM RING INSERTION (N/A) as a surgical intervention .  The patient's history has been reviewed, patient examined, no change in status, stable for surgery.  I have reviewed the patient's chart and labs.  Questions were answered to the patient's satisfaction.     [name]

## 2016-05-26 NOTE — Progress Notes (Signed)
IMMEDIATELY FOLLOWING SURGERY: Do not drive or operate machinery for the first twenty four hours after surgery. Do not make any important decisions for twenty four hours after surgery or while taking narcotic pain medications or sedatives. If you develop intractable nausea and vomiting or a severe headache please notify your doctor immediately.   FOLLOW-UP: You do not need to follow up with anesthesia unless specifically instructed to do so.   WOUND CARE INSTRUCTIONS (if applicable): Expect some mild vaginal bleeding, but if large amount of bleeding occurs please contact Dr. Sondra Come at [phone] or the Radiation On-Call physician. Call for any fever greater than 101.0 degrees or increasing vaginal//abdominal pain or trouble urinating.   QUESTIONS?: Please feel free to call your physician or the hospital operator if you have any questions, and they will be happy to assist you. Resume all medications: as listed on your after visit summary. Your next appointment is:  Future Appointments Date Time Provider [place]  05/27/2016 9:45 AM [facility] [id number] [facility] None  05/30/2016 10:00 AM Lenn Cal, DDS [facility] None  05/30/2016 11:30 AM [facility] [id number] [facility] None  05/30/2016 11:45 AM [facility] LAB 2 [facility] None  05/30/2016 12:00 PM Ni Gorsuch, MD [facility] None  05/31/2016 10:00 AM [facility] I26 DNS [facility] None  06/02/2016 7:00 AM [facility] 1 [facility] [place]  06/02/2016 9:00 AM [facility] NURSE [facility] None  06/02/2016 10:00 AM [name], MD [facility] None  07/04/2016 1:30 PM [name], MD [facility] None

## 2016-05-26 NOTE — Anesthesia Postprocedure Evaluation (Addendum)
Anesthesia Post Note  Patient: Valerie Kerr  Procedure(s) Performed: Procedure(s) (LRB): TANDEM RING INSERTION (N/A)  Patient location during evaluation: PACU Anesthesia Type: General Level of consciousness: awake and alert Pain management: pain level controlled Vital Signs Assessment: post-procedure vital signs reviewed and stable Respiratory status: spontaneous breathing, nonlabored ventilation, respiratory function stable and patient connected to nasal cannula oxygen Cardiovascular status: blood pressure returned to baseline and stable Postop Assessment: no signs of nausea or vomiting Anesthetic complications: no       Last Vitals:  Vitals:   05/26/16 0955 05/26/16 1000  BP:  (!) 143/78  Pulse: 72 71  Resp: (P) 19 16  Temp:      Last Pain:  Vitals:   05/26/16 0955  TempSrc:   PainSc: 7                  Sukhraj Esquivias S

## 2016-05-26 NOTE — Anesthesia Procedure Notes (Signed)
Procedure Name: LMA Insertion Date/Time: 05/26/2016 7:51 AM Performed by: Wanita Chamberlain Pre-anesthesia Checklist: Patient identified, Timeout performed, Emergency Drugs available, Suction available and Patient being monitored Patient Re-evaluated:Patient Re-evaluated prior to inductionOxygen Delivery Method: Circle system utilized Preoxygenation: Pre-oxygenation with 100% oxygen Intubation Type: IV induction Ventilation: Mask ventilation without difficulty LMA: LMA inserted LMA Size: 4.0 Number of attempts: 1 Placement Confirmation: positive ETCO2 and breath sounds checked- equal and bilateral Tube secured with: Tape Dental Injury: Teeth and Oropharynx as per pre-operative assessment

## 2016-05-26 NOTE — Progress Notes (Signed)
Foley catheter removed intact.  Left hand IV removed intact.  Pressure and bandaid applied.  Patient given discharge instructions and taken to the lobby in a wheelchair.

## 2016-05-26 NOTE — Progress Notes (Signed)
  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 Identity was confirmed. All relevant records and images related to the planned course of therapy were reviewed. The patient freely provided informed written consent to proceed with treatment after reviewing the details related to the planned course of therapy. The consent form was witnessed and verified by the simulation staff. Then, the patient was set-up in a stable reproducible supine position for radiation therapy. The tandem ring system was accessed and fiducial markers were placed within the tandem and ring.   Simple treatment device note: On the operating room the patient had construction of her custom tandem ring system. She will be treated with a 30 tandem/ring system. The patient had placement of a 60 mm tandem. A cervical ring with a small shielding was used for her treatment. A rectal paddle was also part of her custom set up device.  Verification simulation note: An AP and lateral film was obtained through the pelvis area. This was compared to the patient's planning films documenting accurate position of the tandem/ring system for treatment.  High-dose-rate brachytherapy treatment note:  The remote afterloading device was accessed through catheter system and attached to the tandem ring system. Patient then proceeded to undergo her first high-dose-rate treatment directed at the cervix. The patient was prescribed a dose of 5.5 gray to be delivered to the high risk CTV.Marland Kitchen Patient was treated with 2 channels using multiple dwell positions. Treatment time was 441.0 seconds. The patient tolerated the procedure well. After completion of her  therapy, a radiation survey was performed documenting return of the iridium source into the [employer] safe. The patient was then transferred to the nursing suite. She then had removal of the rectal paddle followed by the tandem and ring system. The patient tolerated the removal well.  PLAN: The patient will return for her next HDR treatment next week.  -----------------------------------  [name], PhD, MD  This document serves as a record of services personally performed by [name], MD. It was created on his behalf by Maryla Morrow, a trained medical scribe. The creation of this record is based on the scribe's personal observations and the provider's statements to them. This document has been checked and approved by the attending provider.

## 2016-05-27 ENCOUNTER — Ambulatory Visit: Payer: Medicaid Other

## 2016-05-27 ENCOUNTER — Ambulatory Visit: Admission: RE | Admit: 2016-05-27 | Payer: Medicaid Other | Source: Ambulatory Visit

## 2016-05-27 ENCOUNTER — Encounter (HOSPITAL_BASED_OUTPATIENT_CLINIC_OR_DEPARTMENT_OTHER): Payer: Self-pay | Admitting: Radiation Oncology

## 2016-05-27 NOTE — Addendum Note (Signed)
Encounter addended by: Jacqulyn Liner, RN on: 05/27/2016  7:42 AM<BR>    Actions taken: Charge Capture section accepted

## 2016-05-30 ENCOUNTER — Other Ambulatory Visit (HOSPITAL_BASED_OUTPATIENT_CLINIC_OR_DEPARTMENT_OTHER): Payer: Self-pay

## 2016-05-30 ENCOUNTER — Encounter: Payer: Self-pay | Admitting: Hematology and Oncology

## 2016-05-30 ENCOUNTER — Ambulatory Visit (HOSPITAL_COMMUNITY): Payer: Self-pay | Admitting: Dentistry

## 2016-05-30 ENCOUNTER — Encounter (HOSPITAL_COMMUNITY): Payer: Self-pay | Admitting: Dentistry

## 2016-05-30 ENCOUNTER — Other Ambulatory Visit: Payer: Self-pay | Admitting: *Deleted

## 2016-05-30 ENCOUNTER — Ambulatory Visit
Admission: RE | Admit: 2016-05-30 | Discharge: 2016-05-30 | Disposition: A | Discharge status: Home / Self Care | Payer: Medicaid Other | Source: Ambulatory Visit | Attending: Radiation Oncology | Admitting: Radiation Oncology

## 2016-05-30 ENCOUNTER — Telehealth: Payer: Self-pay | Admitting: *Deleted

## 2016-05-30 ENCOUNTER — Ambulatory Visit (HOSPITAL_BASED_OUTPATIENT_CLINIC_OR_DEPARTMENT_OTHER): Payer: Self-pay | Admitting: Hematology and Oncology

## 2016-05-30 ENCOUNTER — Telehealth: Payer: Self-pay | Admitting: Hematology and Oncology

## 2016-05-30 VITALS — BP 135/72 | HR 91 | Temp 98.6°F

## 2016-05-30 VITALS — BP 135/72 | HR 91 | Temp 98.6°F | Resp 19 | Wt 145.6 lb

## 2016-05-30 DIAGNOSIS — K036 Deposits [accretions] on teeth: Secondary | ICD-10-CM

## 2016-05-30 DIAGNOSIS — K011 Impacted teeth: Secondary | ICD-10-CM

## 2016-05-30 DIAGNOSIS — D701 Agranulocytosis secondary to cancer chemotherapy: Secondary | ICD-10-CM

## 2016-05-30 DIAGNOSIS — T451X5A Adverse effect of antineoplastic and immunosuppressive drugs, initial encounter: Secondary | ICD-10-CM

## 2016-05-30 DIAGNOSIS — C539 Malignant neoplasm of cervix uteri, unspecified: Secondary | ICD-10-CM

## 2016-05-30 DIAGNOSIS — K0530 Chronic periodontitis, unspecified: Secondary | ICD-10-CM

## 2016-05-30 DIAGNOSIS — K08199 Complete loss of teeth due to other specified cause, unspecified class: Secondary | ICD-10-CM

## 2016-05-30 DIAGNOSIS — K08409 Partial loss of teeth, unspecified cause, unspecified class: Secondary | ICD-10-CM

## 2016-05-30 DIAGNOSIS — Z01818 Encounter for other preprocedural examination: Secondary | ICD-10-CM

## 2016-05-30 DIAGNOSIS — D6481 Anemia due to antineoplastic chemotherapy: Secondary | ICD-10-CM

## 2016-05-30 DIAGNOSIS — M264 Malocclusion, unspecified: Secondary | ICD-10-CM

## 2016-05-30 DIAGNOSIS — K029 Dental caries, unspecified: Secondary | ICD-10-CM

## 2016-05-30 DIAGNOSIS — Z720 Tobacco use: Secondary | ICD-10-CM

## 2016-05-30 LAB — CBC WITH DIFFERENTIAL/PLATELET
BASO%: 0.5 % (ref 0.0–2.0)
BASOS ABS: 0 10*3/uL (ref 0.0–0.1)
EOS%: 1.2 % (ref 0.0–7.0)
Eosinophils Absolute: 0 10*3/uL (ref 0.0–0.5)
HEMATOCRIT: 30.6 % — AB (ref 34.8–46.6)
HGB: 10.2 g/dL — ABNORMAL LOW (ref 11.6–15.9)
LYMPH#: 0.5 10*3/uL — AB (ref 0.9–3.3)
LYMPH%: 37.7 % (ref 14.0–49.7)
MCH: 28.2 pg (ref 25.1–34.0)
MCHC: 33.2 g/dL (ref 31.5–36.0)
MCV: 85 fL (ref 79.5–101.0)
MONO#: 0.1 10*3/uL (ref 0.1–0.9)
MONO%: 9.4 % (ref 0.0–14.0)
NEUT#: 0.7 10*3/uL — ABNORMAL LOW (ref 1.5–6.5)
NEUT%: 51.2 % (ref 38.4–76.8)
PLATELETS: 256 10*3/uL (ref 145–400)
RBC: 3.6 10*6/uL — ABNORMAL LOW (ref 3.70–5.45)
RDW: 13.7 % (ref 11.2–14.5)
WBC: 1.3 10*3/uL — ABNORMAL LOW (ref 3.9–10.3)

## 2016-05-30 LAB — COMPREHENSIVE METABOLIC PANEL
ALBUMIN: 3.7 g/dL (ref 3.5–5.0)
ALK PHOS: 82 U/L (ref 40–150)
ALT: 16 U/L (ref 0–55)
ANION GAP: 11 meq/L (ref 3–11)
AST: 18 U/L (ref 5–34)
BUN: 15.1 mg/dL (ref 7.0–26.0)
CALCIUM: 8.4 mg/dL (ref 8.4–10.4)
CHLORIDE: 100 meq/L (ref 98–109)
CO2: 32 mEq/L — ABNORMAL HIGH (ref 22–29)
Creatinine: 1.1 mg/dL (ref 0.6–1.1)
EGFR: 62 mL/min/{1.73_m2} — ABNORMAL LOW (ref >90)
Glucose: 112 mg/dl (ref 70–140)
Potassium: 3.8 mEq/L (ref 3.5–5.1)
Sodium: 143 mEq/L (ref 136–145)
Total Bilirubin: 0.34 mg/dL (ref 0.20–1.20)
Total Protein: 7.3 g/dL (ref 6.4–8.3)

## 2016-05-30 LAB — MAGNESIUM: MAGNESIUM: 1.5 mg/dL (ref 1.5–2.5)

## 2016-05-30 MED ORDER — MAGNESIUM OXIDE 400 (241.3 MG) MG PO TABS
400.0000 mg | ORAL_TABLET | Freq: Every day | ORAL | 1 refills | Status: DC
Start: 2016-05-30 — End: 2016-10-09

## 2016-05-30 NOTE — Assessment & Plan Note (Signed)
This is due to recent treatment. I will hold her treatment.  Focus on supportive care. We discussed neutropenic precautions

## 2016-05-30 NOTE — Telephone Encounter (Signed)
Notified of message below. Verbalized understanding 

## 2016-05-30 NOTE — Telephone Encounter (Signed)
Gave patient AVS and calender per 05/30/2016 los. Central radiology to contact with PET scan schedule . Pt aware

## 2016-05-30 NOTE — Telephone Encounter (Signed)
-----   Message from Heath Lark, MD sent at 05/30/2016 12:21 PM EST ----- Regarding: labs Pls tell her I have cancelled her infusion appt tomorrow Magnesium is better but I would recommend she continues taking it until I see her back next month ----- Message ----- From: Interface, Lab In Three Zero One Sent: 05/30/2016  11:43 AM To: Heath Lark, MD

## 2016-05-30 NOTE — Progress Notes (Signed)
[facility] OFFICE PROGRESS NOTE  Patient Care Team: [name], MD as PCP - General (Family Medicine)  SUMMARY OF ONCOLOGIC HISTORY:   Cervical cancer ([place])   02/20/2016 Initial Diagnosis    She presented to the ED with vaginal bleeding and abnormal pelvic mass. She was seen in the office by Dr Baron Sane 03/01/16 who performed cervical biopsies of a friable mass which revealed poorly differentiated squamous cell carcinoma.      03/01/2016 Pathology Results    1. Cervix, biopsy, mass INFILTRATIVE SQUAMOUS CELL CARCINOMA 2. Cervix, biopsy INVASIVE POORLY DIFFERENTIATED SQUAMOUS CELL CARCINOMA      03/03/2016 Imaging    Pelvic US was Suboptimal study due to bowel gas. Difficult visualization of the uterus and adnexal structures appear probable multiple fibroids within the uterus. Endometrium thickened at 14 mm. In the setting of post-menopausal bleeding, endometrial sampling is indicated to exclude carcinoma. If results are benign, sonohysterogram should be considered for focal lesion work-up.       03/23/2016 Miscellaneous    Examination by Dr. [name] in the office concluded the cervix is completely replaced by a 6cm exophytic friable mass which is replacing the cervix and encroaching the upper vaginal fornices and the parametrium bilaterally.      04/04/2016 PET scan    5.5 cm hypermetabolic cervical mass, consistent with known primary cervical carcinoma. No definite local or distant metastatic disease. Sub-cm hypermetabolic left axillary lymph nodes, likely reactive in etiology. Recommend continued attention on follow-up imaging.       04/08/2016 Procedure    Dr. Enrique Sack performed Multiple extraction of tooth numbers 3, 9, 21, 30, and 31 .  2 Quadrants of alveoloplasty 3. Gross debridement of remaining dentition         04/12/2016 - 05/09/2016 Chemotherapy    The patient had weekly concurrent chemotherapy with radiation       04/14/2016 -  Radiation Therapy     The patient had concurrent radiation treatment      05/16/2016 Adverse Reaction    Week 6 of chemotherapy is placed on hold due to pancytopenia.       INTERVAL HISTORY: Please see below for problem oriented charting. She returns for further follow-up. She is doing well. Denies recent vaginal bleeding except after brachytherapy procedure. She is attempting to quit smoking. Denies tinnitus or peripheral neuropathy.  No recent nausea, vomiting or diarrhea.  REVIEW OF SYSTEMS:   Constitutional: Denies fevers, chills or abnormal weight loss Eyes: Denies blurriness of vision Ears, nose, mouth, throat, and face: Denies mucositis or sore throat Respiratory: Denies cough, dyspnea or wheezes Cardiovascular: Denies palpitation, chest discomfort or lower extremity swelling Gastrointestinal:  Denies nausea, heartburn or change in bowel habits Skin: Denies abnormal skin rashes Lymphatics: Denies new lymphadenopathy or easy bruising Neurological:Denies numbness, tingling or new weaknesses Behavioral/Psych: Mood is stable, no new changes  All other systems were reviewed with the patient and are negative.  I have reviewed the past medical history, past surgical history, social history and family history with the patient and they are unchanged from previous note.  ALLERGIES:  is allergic to codeine; fentanyl; and tramadol.  MEDICATIONS:  Current Outpatient Prescriptions  Medication Sig Dispense Refill  . ALPRAZolam (XANAX) 0.25 MG tablet Take 1 tablet (0.25 mg total) by mouth 2 (two) times daily as needed for anxiety. 30 tablet 0  . fluticasone (FLONASE) 50 MCG/ACT nasal spray Place 1 spray into both nostrils daily. 16 g 1  . HYDROmorphone (DILAUDID) 4 MG tablet  Take 1 tablet (4 mg total) by mouth every 4 (four) hours as needed for severe pain. 15 tablet 0  . lisinopril-hydrochlorothiazide (PRINZIDE) 10-12.5 MG tablet Take 1 tablet by mouth daily. 30 tablet 3  . methadone (DOLOPHINE) 10 MG/5ML  solution Take 55 mg by mouth daily.    Marland Kitchen omeprazole (PRILOSEC) 20 MG capsule Take 1 capsule (20 mg total) by mouth daily. 30 capsule 3  . Phenylephrine-APAP-Guaifenesin (SINUS CONGESTION/PAIN SEVERE PO) Take 1 capsule by mouth daily as needed (sinus pain).    Marland Kitchen acetaminophen (TYLENOL) 500 MG tablet Take 500 mg by mouth every 6 (six) hours as needed for mild pain.     Marland Kitchen ibuprofen (ADVIL,MOTRIN) 600 MG tablet Take 1 tablet by mouth every 6 hours for 24 hours and then as needed for moderate to severe pain. (Patient not taking: Reported on 05/30/2016) 30 tablet 0  . potassium chloride SA (K-DUR,KLOR-CON) 20 MEQ tablet Take 1 tablet (20 mEq total) by mouth 2 (two) times daily. (Patient not taking: Reported on 05/30/2016) 14 tablet 0  . prochlorperazine (COMPAZINE) 10 MG tablet Take 1 tablet (10 mg total) by mouth every 6 (six) hours as needed for nausea. (Patient not taking: Reported on 05/30/2016) 20 tablet 0  . simethicone (GAS-X) 80 MG chewable tablet 1 tablet by mouth every 6 hours as needed for burping or flatulence. (Patient not taking: Reported on 05/24/2016) 120 tablet 3   No current facility-administered medications for this visit.     PHYSICAL EXAMINATION: ECOG PERFORMANCE STATUS: 1 - Symptomatic but completely ambulatory  Vitals:   05/30/16 1145  BP: 135/72  Pulse: 91  Resp: 19  Temp: 98.6 F (37 C)   Filed Weights   05/30/16 1145  Weight: 145 lb 9.6 oz (66 kg)    GENERAL:alert, no distress and comfortable SKIN: skin color, texture, turgor are normal, no rashes or significant lesions EYES: normal, Conjunctiva are pink and non-injected, sclera clear Musculoskeletal:no cyanosis of digits and no clubbing  NEURO: alert & oriented x 3 with fluent speech, no focal motor/sensory deficits  LABORATORY DATA:  I have reviewed the data as listed    Component Value Date/Time   NA 143 05/30/2016 1131   K 3.8 05/30/2016 1131   CL 100 (L) 04/08/2016 0630   CO2 32 (H) 05/30/2016 1131    GLUCOSE 112 05/30/2016 1131   BUN 15.1 05/30/2016 1131   CREATININE 1.1 05/30/2016 1131   CALCIUM 8.4 05/30/2016 1131   PROT 7.3 05/30/2016 1131   ALBUMIN 3.7 05/30/2016 1131   AST 18 05/30/2016 1131   ALT 16 05/30/2016 1131   ALKPHOS 82 05/30/2016 1131   BILITOT 0.34 05/30/2016 1131   GFRNONAA >60 04/08/2016 0630   GFRAA >60 04/08/2016 0630    No results found for: SPEP, UPEP  Lab Results  Component Value Date   WBC 1.3 (L) 05/30/2016   NEUTROABS 0.7 (L) 05/30/2016   HGB 10.2 (L) 05/30/2016   HCT 30.6 (L) 05/30/2016   MCV 85.0 05/30/2016   PLT 256 05/30/2016      Chemistry      Component Value Date/Time   NA 143 05/30/2016 1131   K 3.8 05/30/2016 1131   CL 100 (L) 04/08/2016 0630   CO2 32 (H) 05/30/2016 1131   BUN 15.1 05/30/2016 1131   CREATININE 1.1 05/30/2016 1131      Component Value Date/Time   CALCIUM 8.4 05/30/2016 1131   ALKPHOS 82 05/30/2016 1131   AST 18 05/30/2016 1131   ALT  16 05/30/2016 1131   BILITOT 0.34 05/30/2016 1131       RADIOGRAPHIC STUDIES: I have personally reviewed the radiological images as listed and agreed with the findings in the report. Dg Chest 2 View  Result Date: 05/20/2016 CLINICAL DATA:  [place] EXAM: CHEST  2 VIEW COMPARISON:  12/24/2015 FINDINGS: Heart size and vascularity normal.  Negative for pneumonia. Soft tissue density in the posterior costophrenic angle on the lateral view similar to the prior study. Review of the recent PET-CT 03/29/2016 reveals facet hypertrophy which may account for this density on chest x-ray. No Bochdalek's hernia or lung nodules seen on the prior CT. IMPRESSION: No acute abnormality. Electronically Signed   By: Franchot Gallo M.D.   On: 05/20/2016 14:30   [place] Intraoperative  Result Date: 05/26/2016 CLINICAL DATA:  Ultrasound was provided for use by the ordering physician, and a technical charge was applied by the performing facility.  No radiologist interpretation/professional services rendered.     ASSESSMENT & PLAN:  Cervical cancer ([place]) She has completed chemotherapy portion of her treatment. She is still receiving radiation therapy. I plan to see her back in a month with repeat blood work and PET CT scan for assessment of response to treatment. I will alert her gynecologist oncologist to schedule return appointment in the near future  Leukopenia due to antineoplastic chemotherapy [facility]) This is due to recent treatment. I will hold her treatment.  Focus on supportive care. We discussed neutropenic precautions  Continuous tobacco abuse I spent some time counseling the patient the importance of tobacco cessation. We discussed common strategies including nicotine patches, Tobacco Quit-line, and other nicotine replacement products to assist in hereffort to quit  she appears motivated to quit.   Anemia due to antineoplastic chemotherapy This is likely due to recent treatment. The patient denies recent history of bleeding such as epistaxis, hematuria or hematochezia. She is asymptomatic from the anemia. I will observe for now.     Orders Placed This Encounter  Procedures  . NM PET Image Restag (PS) Skull Base To Thigh    Standing Status:   Future    Standing Expiration Date:   07/04/2017    Order Specific Question:   Reason for exam:    Answer:   staging cervical cancer, assess response to Rx    Order Specific Question:   Preferred imaging location?    Answer:   [facility]   All questions were answered. The patient knows to call the clinic with any problems, questions or concerns. No barriers to learning was detected. I spent 15 minutes counseling the patient face to face. The total time spent in the appointment was 20 minutes and more than 50% was on counseling and review of test results     Heath Lark, MD 05/30/2016 12:24 PM

## 2016-05-30 NOTE — Patient Instructions (Signed)
PLAN: 1. Continue to work with social services to obtain Dental Medicaid. 2. Follow-up with a dentist of her choice for exam, radiographs, and discussion of other dental treatment needs to include referral to an oral surgeon for extraction of wisdom teeth, all restorations, and continued periodontal therapy. She will need to be cleared medically to proceed with this additional dental treatment.  3. Brush teeth after meals and at bedtime. Floss at bedtime.  Lenn Cal, DDS

## 2016-05-30 NOTE — Assessment & Plan Note (Signed)
This is likely due to recent treatment. The patient denies recent history of bleeding such as epistaxis, hematuria or hematochezia. She is asymptomatic from the anemia. I will observe for now.   

## 2016-05-30 NOTE — Progress Notes (Signed)
POST OPERATIVE NOTE:  05/30/2016 Valerie Kerr [id number]  VITALS: BP 135/72 (BP Location: Left Arm)   Pulse 91   Temp 98.6 F (37 C) (Oral)   LABS:  Lab Results  Component Value Date   WBC 1.5 (L) 05/23/2016   HGB 10.6 (L) 05/23/2016   HCT 32.3 (L) 05/23/2016   MCV 83.9 05/23/2016   PLT 108 (L) 05/23/2016   BMET    Component Value Date/Time   NA 143 05/23/2016 1124   K 4.1 05/23/2016 1124   CL 100 (L) 04/08/2016 0630   CO2 32 (H) 05/23/2016 1124   GLUCOSE 86 05/23/2016 1124   BUN 19.5 05/23/2016 1124   CREATININE 1.2 (H) 05/23/2016 1124   CALCIUM 8.4 05/23/2016 1124   GFRNONAA >60 04/08/2016 0630   GFRAA >60 04/08/2016 0630    No results found for: INR, PROTIME No results found for: PTT   Valerie Kerr is status post multiple extractions with alveoloplasty and gross debridement of remaining dentition in the operating room on 04/08/2016. Patient now presents for re-evaluation of healing.  SUBJECTIVE: Patient denies having any problems from dental extraction sites.   EXAM: There is no sign of oral infection, heme, or ooze. Patient has healed in well from dental extractions. Patient has impacted third molars. Patient has multiple dental caries in need of restoration by a primary dentist. The patient needs continued periodontal therapy.  ASSESSMENT: Post operative course is consistent with dental procedures performed in the operating room on 04/08/2016. Impacted third molars. Dental caries. Chronic periodontitis Malocclusion  PLAN: 1. Continue to work with social services to obtain Dental Medicaid. 2. Follow-up with a dentist of her choice for exam, radiographs, and discussion of other dental treatment needs to include referral to an oral surgeon for extraction of wisdom teeth, all restorations, and continued periodontal therapy. She will need to be cleared medically to proceed with this additional dental treatment.  3. Brush teeth after meals and at bedtime.  Floss at bedtime.  Lenn Cal, DDS

## 2016-05-30 NOTE — Assessment & Plan Note (Signed)
She has completed chemotherapy portion of her treatment. She is still receiving radiation therapy. I plan to see her back in a month with repeat blood work and PET CT scan for assessment of response to treatment. I will alert her gynecologist oncologist to schedule return appointment in the near future

## 2016-05-30 NOTE — Assessment & Plan Note (Signed)
I spent some time counseling the patient the importance of tobacco cessation. We discussed common strategies including nicotine patches, Tobacco Quit-line, and other nicotine replacement products to assist in hereffort to quit  she appears motivated to quit.  

## 2016-05-31 ENCOUNTER — Ambulatory Visit: Payer: Self-pay

## 2016-05-31 ENCOUNTER — Encounter: Payer: Self-pay | Admitting: Radiation Oncology

## 2016-05-31 ENCOUNTER — Ambulatory Visit
Admission: RE | Admit: 2016-05-31 | Discharge: 2016-05-31 | Disposition: A | Discharge status: Home / Self Care | Payer: Medicaid Other | Source: Ambulatory Visit | Attending: Radiation Oncology | Admitting: Radiation Oncology

## 2016-05-31 ENCOUNTER — Encounter (HOSPITAL_BASED_OUTPATIENT_CLINIC_OR_DEPARTMENT_OTHER): Payer: Self-pay | Admitting: *Deleted

## 2016-05-31 ENCOUNTER — Other Ambulatory Visit: Payer: Self-pay | Admitting: Radiation Oncology

## 2016-05-31 VITALS — BP 136/74 | HR 95 | Temp 98.4°F | Resp 20 | Wt 144.8 lb

## 2016-05-31 DIAGNOSIS — C539 Malignant neoplasm of cervix uteri, unspecified: Secondary | ICD-10-CM

## 2016-05-31 NOTE — Progress Notes (Signed)
NPO AFTER MN.  ARRIVE AT NO:[id number].  CURRENT LAB RESULTS AND EKG IN CHART AND EPIC.  WILL TAKE XANAX, METHADONE, AND PRILOSEC AM DOS W/ SIPS OF WATER.

## 2016-05-31 NOTE — Progress Notes (Signed)
Valerie Kerr completed final 30th fraction to pelvis today.  Reports having abdominal pain to left side intermittently that she takes her po pain medication and reports improvement with.  Reports having fatigue but is slightly improving.  No complaints with bowel movements.  Last episode of nausea was Saturday.     Vitals:   05/31/16 1048  BP: 136/74  Pulse: 95  Resp: 20  Temp: 98.4 F (36.9 C)  TempSrc: Oral  SpO2: 100%  Weight: 144 lb 12.8 oz (65.7 kg)    Wt Readings from Last 3 Encounters:  05/31/16 144 lb 12.8 oz (65.7 kg)  05/30/16 145 lb 9.6 oz (66 kg)  05/26/16 147 lb (66.7 kg)

## 2016-05-31 NOTE — Progress Notes (Signed)
  Radiation Oncology         [phone] ________________________________  Name: Valerie Kerr MRN: [id number]  Date: 05/31/2016  DOB: 27-Sep-1960  Weekly Radiation Therapy Management    ICD-9-CM ICD-10-CM   1. Malignant neoplasm of cervix, unspecified site ([place]) 180.9 C53.9      Current Dose: 54 Gy     Planned Dose:  54 Gy + HDR   Narrative . . . . . . . . The patient presents for routine under treatment assessment.                                    Valerie Kerr completed final 30th fraction to pelvis today.  Reports having left sided abdominal pain intermittently that she takes her pain medication for. Reports the pain removes after taking the medication. Reports having fatigue that is slightly improving. No complaints with bowel movements.  Last episode of nausea was Saturday.                                  Set-up films were reviewed.                                 The chart was checked. Physical Findings. . .  weight is 144 lb 12.8 oz (65.7 kg). Her oral temperature is 98.4 F (36.9 C). Her blood pressure is 136/74 and her pulse is 95. Her respiration is 20 and oxygen saturation is 100%.  Lungs are clear to auscultation bilaterally. Heart has regular rate and rhythm. Abdomen soft, non-tender, normal bowel sounds. Impression . . . . . . . The patient's symptoms are improving. Plan . . . . . . . . . . . . The patient completed external beam radiation therapy. Continue HDR brachytherapy. ________________________________   [name], PhD, MD  This document serves as a record of services personally performed by [name], MD. It was created on his behalf by [name], a trained medical scribe. The creation of this record is based on the scribe's personal observations and the provider's statements to them. This document has been checked and approved by the attending provider.

## 2016-06-01 ENCOUNTER — Other Ambulatory Visit: Payer: Self-pay | Admitting: Radiation Oncology

## 2016-06-01 ENCOUNTER — Telehealth: Payer: Self-pay | Admitting: Oncology

## 2016-06-01 ENCOUNTER — Ambulatory Visit
Admission: RE | Admit: 2016-06-01 | Discharge: 2016-06-01 | Disposition: A | Discharge status: Home / Self Care | Payer: Medicaid Other | Source: Ambulatory Visit | Attending: Radiation Oncology | Admitting: Radiation Oncology

## 2016-06-01 ENCOUNTER — Other Ambulatory Visit: Payer: Self-pay | Admitting: Oncology

## 2016-06-01 DIAGNOSIS — C539 Malignant neoplasm of cervix uteri, unspecified: Secondary | ICD-10-CM | POA: Insufficient documentation

## 2016-06-01 LAB — CBC WITH DIFFERENTIAL/PLATELET
BASO%: 0.2 % (ref 0.0–2.0)
Basophils Absolute: 0 10*3/uL (ref 0.0–0.1)
EOS ABS: 0 10*3/uL (ref 0.0–0.5)
EOS%: 0.5 % (ref 0.0–7.0)
HCT: 30.5 % — ABNORMAL LOW (ref 34.8–46.6)
HEMOGLOBIN: 10 g/dL — AB (ref 11.6–15.9)
LYMPH%: 39.7 % (ref 14.0–49.7)
MCH: 28 pg (ref 25.1–34.0)
MCHC: 32.8 g/dL (ref 31.5–36.0)
MCV: 85.2 fL (ref 79.5–101.0)
MONO#: 0.2 10*3/uL (ref 0.1–0.9)
MONO%: 11.9 % (ref 0.0–14.0)
NEUT%: 47.7 % (ref 38.4–76.8)
NEUTROS ABS: 0.6 10*3/uL — AB (ref 1.5–6.5)
Platelets: 353 10*3/uL (ref 145–400)
RBC: 3.58 10*6/uL — ABNORMAL LOW (ref 3.70–5.45)
RDW: 14.2 % (ref 11.2–14.5)
WBC: 1.3 10*3/uL — AB (ref 3.9–10.3)
lymph#: 0.5 10*3/uL — ABNORMAL LOW (ref 0.9–3.3)

## 2016-06-01 MED FILL — OMEPRAZOLE 20 MG CAPSULE DR: 20 | 30 days supply | Qty: 30 | Fill #0

## 2016-06-01 MED FILL — METOCLOPRAMIDE 10 MG TABLET: 10 | 30 days supply | Qty: 90 | Fill #0

## 2016-06-01 MED FILL — LISINOPRIL-HCTZ 10-12.5 MG: 10-12.5 | 30 days supply | Qty: 30 | Fill #1

## 2016-06-01 MED FILL — MAGNESIUM OXIDE 400 MG TAB: 400 (240 MG | 30 days supply | Qty: 30 | Fill #0

## 2016-06-01 NOTE — Telephone Encounter (Signed)
Called patient and advised her that Dr. Sondra Come has reviewed her blood counts from today and would like to proceed with the tandem and ring procedure tomorrow morning.  Valerie Kerr verbalized agreement and said she will be at the [facility] at 6:45 am tomorrow.

## 2016-06-01 NOTE — Telephone Encounter (Signed)
Called patient and she said she will be coming in at 2 pm for labs.

## 2016-06-01 NOTE — Telephone Encounter (Signed)
Left message for patient to see if she can come to the [facility] for lab work today.  Requested a return call.

## 2016-06-02 ENCOUNTER — Ambulatory Visit (HOSPITAL_COMMUNITY)
Admission: RE | Admit: 2016-06-02 | Discharge: 2016-06-02 | Disposition: A | Discharge status: Home / Self Care | Payer: Self-pay | Source: Ambulatory Visit | Attending: Radiation Oncology | Admitting: Radiation Oncology

## 2016-06-02 ENCOUNTER — Ambulatory Visit (HOSPITAL_BASED_OUTPATIENT_CLINIC_OR_DEPARTMENT_OTHER)
Admission: RE | Admit: 2016-06-02 | Discharge: 2016-06-02 | Disposition: A | Discharge status: Home / Self Care | Payer: Self-pay | Source: Ambulatory Visit | Attending: Radiation Oncology | Admitting: Radiation Oncology

## 2016-06-02 ENCOUNTER — Ambulatory Visit
Admission: RE | Admit: 2016-06-02 | Discharge: 2016-06-02 | Disposition: A | Discharge status: Home / Self Care | Payer: Medicaid Other | Source: Ambulatory Visit | Attending: Radiation Oncology | Admitting: Radiation Oncology

## 2016-06-02 ENCOUNTER — Encounter (HOSPITAL_BASED_OUTPATIENT_CLINIC_OR_DEPARTMENT_OTHER)
Admission: RE | Disposition: A | Discharge status: Home / Self Care | Payer: Self-pay | Source: Ambulatory Visit | Attending: Radiation Oncology

## 2016-06-02 ENCOUNTER — Ambulatory Visit (HOSPITAL_BASED_OUTPATIENT_CLINIC_OR_DEPARTMENT_OTHER): Payer: Self-pay | Admitting: Anesthesiology

## 2016-06-02 ENCOUNTER — Ambulatory Visit: Payer: Medicaid Other

## 2016-06-02 ENCOUNTER — Encounter (HOSPITAL_BASED_OUTPATIENT_CLINIC_OR_DEPARTMENT_OTHER): Payer: Self-pay

## 2016-06-02 VITALS — BP 140/84 | HR 75 | Temp 98.5°F | Resp 20

## 2016-06-02 VITALS — BP 146/74 | HR 86

## 2016-06-02 DIAGNOSIS — C539 Malignant neoplasm of cervix uteri, unspecified: Secondary | ICD-10-CM

## 2016-06-02 DIAGNOSIS — I10 Essential (primary) hypertension: Secondary | ICD-10-CM | POA: Insufficient documentation

## 2016-06-02 DIAGNOSIS — Z791 Long term (current) use of non-steroidal anti-inflammatories (NSAID): Secondary | ICD-10-CM | POA: Insufficient documentation

## 2016-06-02 DIAGNOSIS — Z79899 Other long term (current) drug therapy: Secondary | ICD-10-CM | POA: Insufficient documentation

## 2016-06-02 DIAGNOSIS — K219 Gastro-esophageal reflux disease without esophagitis: Secondary | ICD-10-CM | POA: Insufficient documentation

## 2016-06-02 DIAGNOSIS — F17210 Nicotine dependence, cigarettes, uncomplicated: Secondary | ICD-10-CM | POA: Insufficient documentation

## 2016-06-02 DIAGNOSIS — F419 Anxiety disorder, unspecified: Secondary | ICD-10-CM | POA: Insufficient documentation

## 2016-06-02 DIAGNOSIS — G8929 Other chronic pain: Secondary | ICD-10-CM | POA: Insufficient documentation

## 2016-06-02 HISTORY — DX: Antineoplastic chemotherapy induced pancytopenia: D61.810

## 2016-06-02 HISTORY — DX: Other secondary thrombocytopenia: D69.59

## 2016-06-02 HISTORY — DX: Agranulocytosis secondary to cancer chemotherapy: D70.1

## 2016-06-02 HISTORY — DX: Personal history of irradiation: Z92.3

## 2016-06-02 HISTORY — DX: Other intervertebral disc displacement, lumbar region: M51.26

## 2016-06-02 HISTORY — DX: Agranulocytosis secondary to cancer chemotherapy: T45.1X5A

## 2016-06-02 HISTORY — DX: Hypokalemia: E87.6

## 2016-06-02 HISTORY — DX: Gastro-esophageal reflux disease without esophagitis: K21.9

## 2016-06-02 HISTORY — DX: Hypomagnesemia: E83.42

## 2016-06-02 HISTORY — PX: TANDEM RING INSERTION: SHX6199

## 2016-06-02 HISTORY — DX: Chronic periodontitis, unspecified: K05.30

## 2016-06-02 SURGERY — INSERTION, UTERINE TANDEM AND RING OR CYLINDER, FOR BRACHYTHERAPY
Anesthesia: General | Site: Cervix

## 2016-06-02 MED ORDER — HYDROMORPHONE HCL 1 MG/ML IJ SOLN
0.2500 mg | INTRAMUSCULAR | Status: DC | PRN
  Administered 2016-06-02 (×4): 0.5 mg via INTRAVENOUS
  Filled 2016-06-02: qty 0.5

## 2016-06-02 MED ORDER — KETOROLAC TROMETHAMINE 30 MG/ML IJ SOLN
INTRAMUSCULAR | Status: DC | PRN
  Administered 2016-06-02: 30 mg via INTRAVENOUS

## 2016-06-02 MED ORDER — HYDROMORPHONE HCL 2 MG/ML IJ SOLN
INTRAMUSCULAR | Status: AC
  Filled 2016-06-02: qty 1

## 2016-06-02 MED ORDER — PROPOFOL 10 MG/ML IV BOLUS
INTRAVENOUS | Status: AC
  Filled 2016-06-02: qty 20

## 2016-06-02 MED ORDER — HYDROMORPHONE HCL 1 MG/ML IJ SOLN
1.0000 mg | Freq: Once | INTRAMUSCULAR | Status: AC
  Administered 2016-06-02: 1 mg via INTRAVENOUS
  Filled 2016-06-02: qty 1

## 2016-06-02 MED ORDER — LIDOCAINE HCL (CARDIAC) 20 MG/ML IV SOLN
INTRAVENOUS | Status: DC | PRN
  Administered 2016-06-02: 80 mg via INTRAVENOUS

## 2016-06-02 MED ORDER — HYDROMORPHONE HCL 1 MG/ML IJ SOLN
INTRAMUSCULAR | Status: DC | PRN
  Administered 2016-06-02 (×4): 0.5 mg via INTRAVENOUS

## 2016-06-02 MED ORDER — ACETAMINOPHEN 10 MG/ML IV SOLN
INTRAVENOUS | Status: DC | PRN
  Administered 2016-06-02: 1000 mg via INTRAVENOUS

## 2016-06-02 MED ORDER — PROPOFOL 10 MG/ML IV BOLUS
INTRAVENOUS | Status: DC | PRN
  Administered 2016-06-02: 200 mg via INTRAVENOUS
  Administered 2016-06-02: 100 mg via INTRAVENOUS

## 2016-06-02 MED ORDER — CEFAZOLIN SODIUM-DEXTROSE 2-3 GM-% IV SOLR
INTRAVENOUS | Status: DC | PRN
  Administered 2016-06-02: 2 g via INTRAVENOUS

## 2016-06-02 MED ORDER — ONDANSETRON HCL 4 MG/2ML IJ SOLN
INTRAMUSCULAR | Status: DC | PRN
  Administered 2016-06-02: 4 mg via INTRAVENOUS

## 2016-06-02 MED ORDER — KETOROLAC TROMETHAMINE 30 MG/ML IJ SOLN
INTRAMUSCULAR | Status: AC
  Filled 2016-06-02: qty 1

## 2016-06-02 MED ORDER — ONDANSETRON HCL 4 MG/2ML IJ SOLN
INTRAMUSCULAR | Status: AC
  Filled 2016-06-02: qty 2

## 2016-06-02 MED ORDER — MIDAZOLAM HCL 2 MG/2ML IJ SOLN
INTRAMUSCULAR | Status: AC
  Filled 2016-06-02: qty 2

## 2016-06-02 MED ORDER — WATER FOR IRRIGATION, STERILE IR SOLN
Status: DC | PRN
  Administered 2016-06-02: 200 mL via INTRAVESICAL

## 2016-06-02 MED ORDER — FENTANYL CITRATE (PF) 100 MCG/2ML IJ SOLN
25.0000 ug | INTRAMUSCULAR | Status: DC | PRN
  Filled 2016-06-02: qty 1

## 2016-06-02 MED ORDER — DEXAMETHASONE SODIUM PHOSPHATE 10 MG/ML IJ SOLN
INTRAMUSCULAR | Status: AC
  Filled 2016-06-02: qty 1

## 2016-06-02 MED ORDER — MIDAZOLAM HCL 5 MG/5ML IJ SOLN
INTRAMUSCULAR | Status: DC | PRN
  Administered 2016-06-02: 2 mg via INTRAVENOUS

## 2016-06-02 MED ORDER — CEFAZOLIN SODIUM-DEXTROSE 2-4 GM/100ML-% IV SOLN
INTRAVENOUS | Status: AC
  Filled 2016-06-02: qty 100

## 2016-06-02 MED ORDER — CIPROFLOXACIN HCL 500 MG PO TABS: 500.0000 mg | ORAL_TABLET | Freq: Two times a day (BID) | ORAL | 0 refills | Status: DC

## 2016-06-02 MED ORDER — HYDROMORPHONE HCL 1 MG/ML IJ SOLN
INTRAMUSCULAR | Status: AC
  Filled 2016-06-02: qty 1

## 2016-06-02 MED ORDER — DEXAMETHASONE SODIUM PHOSPHATE 4 MG/ML IJ SOLN
INTRAMUSCULAR | Status: DC | PRN
  Administered 2016-06-02: 10 mg via INTRAVENOUS

## 2016-06-02 MED ORDER — LACTATED RINGERS IV SOLN
INTRAVENOUS | Status: DC
  Administered 2016-06-02: 07:00:00 via INTRAVENOUS
  Filled 2016-06-02 (×2): qty 1000

## 2016-06-02 MED ORDER — ACETAMINOPHEN 10 MG/ML IV SOLN
INTRAVENOUS | Status: AC
  Filled 2016-06-02: qty 100

## 2016-06-02 MED ORDER — LIDOCAINE 2% (20 MG/ML) 5 ML SYRINGE
INTRAMUSCULAR | Status: AC
  Filled 2016-06-02: qty 5

## 2016-06-02 MED ORDER — HYDROMORPHONE HCL 4 MG PO TABS: 4.0000 mg | ORAL_TABLET | ORAL | 0 refills | Status: DC | PRN

## 2016-06-02 MED FILL — CIPROFLOXACIN HCL 500 MG TA: 500 | 7 days supply | Qty: 14 | Fill #0

## 2016-06-02 MED FILL — HYDROmorphone HCL 4 MG TABS: 4 | 2 days supply | Qty: 15 | Fill #0

## 2016-06-02 SURGICAL SUPPLY — 36 items
BAG URINE DRAINAGE (UROLOGICAL SUPPLIES) ×3 IMPLANT
BNDG CONFORM 2 STRL LF (GAUZE/BANDAGES/DRESSINGS) IMPLANT
CATH FOLEY 2WAY SLVR  5CC 16FR (CATHETERS) ×2
CATH FOLEY 2WAY SLVR 5CC 16FR (CATHETERS) ×1 IMPLANT
COVER BACK TABLE 60X90IN (DRAPES) ×3 IMPLANT
DRAPE LG THREE QUARTER DISP (DRAPES) ×3 IMPLANT
DRAPE UNDERBUTTOCKS STRL (DRAPE) ×3 IMPLANT
DRSG PAD ABDOMINAL 8X10 ST (GAUZE/BANDAGES/DRESSINGS) ×2 IMPLANT
GLOVE BIO SURGEON STRL SZ7.5 (GLOVE) ×6 IMPLANT
GOWN STRL REUS W/ TWL LRG LVL3 (GOWN DISPOSABLE) ×2 IMPLANT
GOWN STRL REUS W/TWL LRG LVL3 (GOWN DISPOSABLE) ×6
HOLDER FOLEY CATH W/STRAP (MISCELLANEOUS) ×3 IMPLANT
KIT RM TURNOVER CYSTO AR (KITS) ×3 IMPLANT
LEGGING LITHOTOMY PAIR STRL (DRAPES) ×3 IMPLANT
MANIFOLD NEPTUNE II (INSTRUMENTS) IMPLANT
NDL SPNL 22GX3.5 QUINCKE BK (NEEDLE) IMPLANT
NEEDLE SPNL 22GX3.5 QUINCKE BK (NEEDLE) IMPLANT
PACK BASIN DAY SURGERY FS (CUSTOM PROCEDURE TRAY) ×3 IMPLANT
PACKING VAGINAL (PACKING) IMPLANT
PAD ABD 8X10 STRL (GAUZE/BANDAGES/DRESSINGS) ×3 IMPLANT
PAD OB MATERNITY 4.3X12.25 (PERSONAL CARE ITEMS) IMPLANT
PAD PREP 24X48 CUFFED NSTRL (MISCELLANEOUS) ×3 IMPLANT
PLUG CATH AND CAP STER (CATHETERS) IMPLANT
SET IRRIG Y TYPE TUR BLADDER L (SET/KITS/TRAYS/PACK) ×3 IMPLANT
SUT PROLENE 0 SH 30 (SUTURE) IMPLANT
SUT SILK 2 0 30  PSL (SUTURE)
SUT SILK 2 0 30 PSL (SUTURE) IMPLANT
SYR BULB IRRIGATION 50ML (SYRINGE) IMPLANT
SYR CONTROL 10ML LL (SYRINGE) IMPLANT
SYRINGE 10CC LL (SYRINGE) ×3 IMPLANT
TOWEL OR 17X24 6PK STRL BLUE (TOWEL DISPOSABLE) ×6 IMPLANT
TRAY DSU PREP LF (CUSTOM PROCEDURE TRAY) ×3 IMPLANT
TUBE CONNECTING 12'X1/4 (SUCTIONS)
TUBE CONNECTING 12X1/4 (SUCTIONS) IMPLANT
WATER STERILE IRR 3000ML UROMA (IV SOLUTION) ×3 IMPLANT
WATER STERILE IRR 500ML POUR (IV SOLUTION) ×3 IMPLANT

## 2016-06-02 NOTE — Progress Notes (Signed)
Patient reports pain has improved.  Will continue to monitor.

## 2016-06-02 NOTE — Brief Op Note (Signed)
06/02/2016  10:13 AM  PATIENT:  Valerie Kerr  56 y.o. female  PRE-OPERATIVE DIAGNOSIS:  cervical cancer  POST-OPERATIVE DIAGNOSIS:  cervical cancer  PROCEDURE:  Procedure(s): TANDEM RING INSERTION (N/A)  SURGEON:  Surgeon(s) and Role:    * [name], MD - Primary  PHYSICIAN ASSISTANT:   ASSISTANTS: none   ANESTHESIA:   general  EBL:  Total I/O In: 900 [I.V.:900] Out: 15 [Blood:15]  BLOOD ADMINISTERED:none  DRAINS: Urinary Catheter (Foley)   LOCAL MEDICATIONS USED:  NONE  SPECIMEN:  No Specimen  DISPOSITION OF SPECIMEN:  N/A  COUNTS:  YES  TOURNIQUET:  * No tourniquets in log *  DICTATION: The patient was taken to the outpatient OR #1. Timeout was performed for the procedure, length of anesthesia, preoperative antibiotics. Patient was given 2 g of Ancef IV prior to the initiation of the procedure in light of her neutropenia.  She was prepped and draped in the usual sterile fashion and placed in the dorsal lithotomy position. Exam under anesthesia revealed excellent response to her external beam radiation therapy and radiosensitizing chemotherapy. The cervical mass was estimated to be approximately 2.5 x 3 cm in size. There was no parametrial extension noted on exam. The cervical mass continued to protrude into the upper vaginal fault was not a attached to the upper vaginal mucosa. The bladder was filled with approximately 200 mL of sterile water to aid in ultrasound imaging purposes .Patient proceeded to undergo dilation and sounding. The uterus was noted to be quite thin and was noted to be an anteverted oriented towards the patient's left position with intraoperative ultrasound imaging. The uterus sounded to approximately 8-10 cm. Despite several attempts with dilation a cervical sleeve could not be placed. The patient then had placement of a 30 60 mm tandem within the uterine canal. Intraoperative ultrasound images verified good placement. The uterus was noted to be  deviated significantly to the left of the patient.  the patient then had placement of a 30 ring attached to the tandem. Patient then had placement of a rectal paddle to push the rectal mucosa out of the high-dose radiation field. Intraoperative ultrasound at the end of the procedure verified accurate  placement of the equipment. The tandem was noted to be in the endometrial canal. Patient tolerated the procedure well. The patient was transported to recovery room in stable condition. Later in the day the patient will be transported to radiation oncology for planning and her first high-dose-rate intracavitary brachytherapy treatment with iridium 192 as the high-dose-rate source.  PLAN OF CARE: Transferred to radiation oncology for planning and treatment  PATIENT DISPOSITION:  PACU - hemodynamically stable.   Delay start of Pharmacological VTE agent (>24hrs) due to surgical blood loss or risk of bleeding: not applicable

## 2016-06-02 NOTE — Progress Notes (Signed)
  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  Identity was confirmed.  All relevant records and images related to the planned course of therapy were reviewed.  The patient freely provided informed written consent to proceed with treatment after reviewing the details related to the planned course of therapy. The consent form was witnessed and verified by the simulation staff.  Then, the patient was set-up in a stable reproducible  supine position for radiation therapy.  CT images were obtained.  Surface markings were placed.  The CT images were loaded into the planning software.  Then the target and avoidance structures were contoured.  Treatment planning then occurred.  The radiation prescription was entered and confirmed.   I have requested : Brachytherapy Isodose Plan and Dosimetry Calculations to plan the radiation distribution.    PLAN:  The patient will receive 5.5 Gy in 1 fraction directed at the high-risk CTV. Patient will be treated with iridium 192 as the high-dose-rate source. The patient will be treated with a tandem ring system    ________________________________  [name], PhD, MD

## 2016-06-02 NOTE — Interval H&P Note (Signed)
History and Physical Interval Note:  06/02/2016 8:21 AM  Valerie Kerr  has presented today for surgery, with the diagnosis of cervical cancer  The various methods of treatment have been discussed with the patient and family. After consideration of risks, benefits and other options for treatment, the patient has consented to  Procedure(s): TANDEM RING INSERTION (N/A) as a surgical intervention .  The patient's history has been reviewed, patient examined, no change in status, stable for surgery.  I have reviewed the patient's chart and labs.  Questions were answered to the patient's satisfaction.     [name]

## 2016-06-02 NOTE — Anesthesia Preprocedure Evaluation (Signed)
Anesthesia Evaluation  Patient identified by MRN, date of birth, ID band Patient awake    Reviewed: Allergy & Precautions, NPO status , Patient's Chart, lab work & pertinent test results  Airway Mallampati: II  TM Distance: >3 FB     Dental   Pulmonary Current Smoker,    breath sounds clear to auscultation       Cardiovascular hypertension,  Rhythm:Regular Rate:Normal     Neuro/Psych    GI/Hepatic Neg liver ROS, GERD  ,  Endo/Other  negative endocrine ROS  Renal/GU negative Renal ROS     Musculoskeletal   Abdominal   Peds  Hematology  (+) anemia ,   Anesthesia Other Findings   Reproductive/Obstetrics                             Anesthesia Physical Anesthesia Plan  ASA: III  Anesthesia Plan: General   Post-op Pain Management:    Induction: Intravenous  Airway Management Planned: LMA  Additional Equipment:   Intra-op Plan:   Post-operative Plan: Extubation in OR  Informed Consent: I have reviewed the patients History and Physical, chart, labs and discussed the procedure including the risks, benefits and alternatives for the proposed anesthesia with the patient or authorized representative who has indicated his/her understanding and acceptance.   Dental advisory given  Plan Discussed with: Anesthesiologist and CRNA  Anesthesia Plan Comments:         Anesthesia Quick Evaluation

## 2016-06-02 NOTE — Anesthesia Procedure Notes (Signed)
Procedure Name: LMA Insertion Date/Time: 06/02/2016 8:42 AM Performed by: Justice Rocher Pre-anesthesia Checklist: Patient identified, Emergency Drugs available, Suction available and Patient being monitored Patient Re-evaluated:Patient Re-evaluated prior to inductionOxygen Delivery Method: Circle system utilized Preoxygenation: Pre-oxygenation with 100% oxygen Intubation Type: IV induction Ventilation: Mask ventilation without difficulty LMA: LMA inserted LMA Size: 4.0 Number of attempts: 1 Airway Equipment and Method: Bite block Placement Confirmation: positive ETCO2 Tube secured with: Tape Dental Injury: Teeth and Oropharynx as per pre-operative assessment

## 2016-06-02 NOTE — Progress Notes (Signed)
  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 Identity was confirmed. All relevant records and images related to the planned course of therapy were reviewed. The patient freely provided informed written consent to proceed with treatment after reviewing the details related to the planned course of therapy. The consent form was witnessed and verified by the simulation staff. Then, the patient was set-up in a stable reproducible supine position for radiation therapy. The tandem ring system was accessed and fiducial markers were placed within the tandem and ring.   Simple treatment device note: On the operating room the patient had construction of her custom tandem ring system. She will be treated with a 30 tandem/ring system. The patient had placement of a 60 mm tandem. A cervical ring with a small shielding was used for her treatment. A rectal paddle was also part of her custom set up device.  Verification simulation note: An AP and lateral film was obtained through the pelvis area. This was compared to the patient's planning films documenting accurate position of the tandem/ring system for treatment.  High-dose-rate brachytherapy treatment note:  The remote afterloading device was accessed through catheter system and attached to the tandem ring system. Patient then proceeded to undergo her second high-dose-rate treatment directed at the cervix. The patient was prescribed a dose of 5.5 gray to be delivered to the high risk CTV.Marland Kitchen Patient was treated with 2 channels using multiple dwell positions. Treatment time was 612.3 seconds. The patient tolerated the procedure well. After completion of her  therapy, a radiation survey was performed documenting return of the iridium source into the [employer] safe. The patient was then transferred to the nursing suite. She then had removal of the rectal paddle followed by the tandem and ring system. The patient tolerated the removal well.  PLAN: The patient will return for her next HDR treatment next week. Patient did receive 2 g of Ancef prior to her operative procedure in light of her neutropenia. The patient's ex-husband is critically ill in [place] and she will be traveling tomorrow to assist with family issues. I recommended strongly consider remaining in [place] in light of her neutropenia but given her obligations she will likely proceed to [place]. I have given the patient a prescription for Cipro in the event that she develops a fever and cannot make it urgently to the ER. She will start this medication if she develops a fever. Dr. Alvy Bimler feels the patient is not a good candidate for Neupogen.  -----------------------------------  [name], PhD, MD  This document serves as a record of services personally performed by [name], MD. It was created on his behalf by Bethann Humble, a trained medical scribe. The creation of this record is based on the scribe's personal observations and the provider's statements to them. This document has been checked and approved by the attending provider.

## 2016-06-02 NOTE — Transfer of Care (Signed)
Immediate Anesthesia Transfer of Care Note  Patient: Valerie Kerr  Procedure(s) Performed: Procedure(s) (LRB): TANDEM RING INSERTION (N/A)  Patient Location: PACU  Anesthesia Type: General  Level of Consciousness: awake, sedated, patient cooperative and responds to stimulation  Airway & Oxygen Therapy: Patient Spontanous Breathing and Patient connected to [place] O2  Post-op Assessment: Report given to PACU RN, Post -op Vital signs reviewed and stable and Patient moving all extremities  Post vital signs: Reviewed and stable  Complications: No apparent anesthesia complications

## 2016-06-02 NOTE — Progress Notes (Signed)
IMMEDIATELY FOLLOWING SURGERY: Do not drive or operate machinery for the first twenty four hours after surgery. Do not make any important decisions for twenty four hours after surgery or while taking narcotic pain medications or sedatives. If you develop intractable nausea and vomiting or a severe headache please notify your doctor immediately.   FOLLOW-UP: You do not need to follow up with anesthesia unless specifically instructed to do so.   WOUND CARE INSTRUCTIONS (if applicable): Expect some mild vaginal bleeding, but if large amount of bleeding occurs please contact Dr. Sondra Come at [phone] or the Radiation On-Call physician. Call for any fever greater than 101.0 degrees or increasing vaginal//abdominal pain or trouble urinating.   QUESTIONS?: Please feel free to call your physician or the hospital operator if you have any questions, and they will be happy to assist you. Resume all medications: as listed on your after visit summary. Your next appointment is:  Future Appointments Date Time Provider [place]  06/09/2016 9:00 AM [facility]-US 1 WL-US [place]  06/09/2016 11:30 AM [name], MD [facility] None  06/09/2016 3:00 PM [name], MD [facility] None  06/13/2016 6:30 AM WL-US 1 WL-US [place]  06/13/2016 10:00 AM [name], MD [facility] None  06/13/2016 12:00 PM [name], MD [facility] None  06/20/2016 7:00 AM WL-US 1 WL-US [place]  06/20/2016 10:00 AM [name], MD [facility] None  06/20/2016 1:00 PM [name], MD [facility] None  06/29/2016 10:15 AM [facility] LAB 2 [facility] None  06/30/2016 10:15 AM Heath Lark, MD [facility] None  07/04/2016 1:30 PM [name], MD [facility] None  08/15/2016 3:00 PM [name], MD [facility] None

## 2016-06-02 NOTE — H&P (View-Only) (Signed)
Radiation Oncology         [phone] ________________________________  History and physical examination  Name: Valerie Kerr MRN: [id number]  Date: 05/25/16  DOB: 1960-03-29    DIAGNOSIS: stage II-B poorly differentiated squamous cell carcinoma of the cervix.  HISTORY OF PRESENT ILLNESS::Valerie Kerr is a 56 y.o. female who is currently under treatment with external beam radiation therapy and radiosensitizing chemotherapy. The patient has completed approximately 45 gray and will now proceed with intracavitary brachytherapy treatments as part of her overall management.Marland Kitchen    PAST MEDICAL HISTORY:  has a past medical history of Acid reflux; Anxiety; Cervical cancer ([place]); Chronic pain disorder; Dental caries; Environmental allergies; Headache; Herniated disc, cervical; and Hypertension.    PAST SURGICAL HISTORY: Past Surgical History:  Procedure Laterality Date  . ABCESS DRAINAGE Left 1982   breast  . CARPAL TUNNEL RELEASE Bilateral   . CERVICAL DISC SURGERY  2010  . MULTIPLE EXTRACTIONS WITH ALVEOLOPLASTY N/A 04/08/2016   Procedure: Extraction of tooth #'s [phone] with alveoloplasty  and gross debridement of remaining teeth;  Surgeon: Lenn Cal, DDS;  Location: [facility];  Service: Oral Surgery;  Laterality: N/A;    FAMILY HISTORY: family history includes Cancer in her brother, brother, father, and sister.  SOCIAL HISTORY:  reports that she has been smoking Cigarettes.  She has a 11.25 pack-year smoking history. She has never used smokeless tobacco. She reports that she does not drink alcohol or use drugs.  ALLERGIES: Codeine; Fentanyl; and Tramadol  MEDICATIONS:  No current facility-administered medications for this encounter.    Current Outpatient Prescriptions  Medication Sig Dispense Refill  . acetaminophen (TYLENOL) 500 MG tablet Take 500 mg by mouth every 6 (six) hours as needed for mild pain.     Marland Kitchen ALPRAZolam (XANAX) 0.25 MG tablet Take 1 tablet (0.25 mg  total) by mouth 2 (two) times daily as needed for anxiety. 30 tablet 0  . fluticasone (FLONASE) 50 MCG/ACT nasal spray Place 1 spray into both nostrils daily. 16 g 1  . ibuprofen (ADVIL,MOTRIN) 600 MG tablet Take 1 tablet by mouth every 6 hours for 24 hours and then as needed for moderate to severe pain. (Patient not taking: Reported on 05/24/2016) 30 tablet 0  . lisinopril-hydrochlorothiazide (PRINZIDE) 10-12.5 MG tablet Take 1 tablet by mouth daily. 30 tablet 3  . Magnesium Oxide -Mg Supplement 400 MG CAPS Take 400 mg by mouth daily. Hold if diarrhea. 30 capsule 0  . methadone (DOLOPHINE) 10 MG/5ML solution Take 55 mg by mouth daily.    Marland Kitchen omeprazole (PRILOSEC) 20 MG capsule Take 1 capsule (20 mg total) by mouth daily. 30 capsule 3  . Phenylephrine-APAP-Guaifenesin (SINUS CONGESTION/PAIN SEVERE PO) Take 1 capsule by mouth daily as needed (sinus pain).    . potassium chloride SA (K-DUR,KLOR-CON) 20 MEQ tablet Take 1 tablet (20 mEq total) by mouth 2 (two) times daily. 14 tablet 0  . prochlorperazine (COMPAZINE) 10 MG tablet Take 1 tablet (10 mg total) by mouth every 6 (six) hours as needed for nausea. (Patient not taking: Reported on 05/24/2016) 20 tablet 0  . simethicone (GAS-X) 80 MG chewable tablet 1 tablet by mouth every 6 hours as needed for burping or flatulence. (Patient not taking: Reported on 05/24/2016) 120 tablet 3    REVIEW OF SYSTEMS:  A 15 point review of systems is documented in the electronic medical record. This was obtained by the nursing staff. However, I reviewed this with the patient to discuss relevant findings and make appropriate  changes.    PHYSICAL EXAM:   height is 5\' 1"  (1.549 m) and weight is 144 lb 9.6 oz (65.6 kg). Her oral temperature is 98.5 F (36.9 C). Her blood pressure is 124/74 and her pulse is 95. Her oxygen saturation is 100%.   General: Alert and oriented, in no acute distress HEENT: Head is normocephalic. Extraocular movements are intact. Oropharynx is  clear. Neck: Neck is supple, no palpable cervical or supraclavicular lymphadenopathy. Heart: Regular in rate and rhythm with no murmurs, rubs, or gallops. Chest: Clear to auscultation bilaterally, with no rhonchi, wheezes, or rales. Abdomen: Soft, nontender, nondistended, with no rigidity or guarding. Extremities: No cyanosis or edema. Lymphatics: see Neck Exam Skin: No concerning lesions. Musculoskeletal: symmetric strength and muscle tone throughout. Neurologic: Cranial nerves II through XII are grossly intact. No obvious focalities. Speech is fluent. Coordination is intact. Psychiatric: Judgment and insight are intact. Affect is appropriate. Pelvic exam to be performed in the operating room pretreatment pelvic exam showed the cervical mass to be approximately 6 cm with bilateral parametrial involvement    LABORATORY DATA:  Lab Results  Component Value Date   WBC 1.5 (L) 05/23/2016   HGB 10.6 (L) 05/23/2016   HCT 32.3 (L) 05/23/2016   MCV 83.9 05/23/2016   PLT 108 (L) 05/23/2016   NEUTROABS 1.1 (L) 05/23/2016   Lab Results  Component Value Date   NA 143 05/23/2016   K 4.1 05/23/2016   CL 100 (L) 04/08/2016   CO2 32 (H) 05/23/2016   GLUCOSE 86 05/23/2016   CREATININE 1.2 (H) 05/23/2016   CALCIUM 8.4 05/23/2016      RADIOGRAPHY: Dg Chest 2 View  Result Date: 05/20/2016 CLINICAL DATA:  [place] EXAM: CHEST  2 VIEW COMPARISON:  12/24/2015 FINDINGS: Heart size and vascularity normal.  Negative for pneumonia. Soft tissue density in the posterior costophrenic angle on the lateral view similar to the prior study. Review of the recent PET-CT 03/29/2016 reveals facet hypertrophy which may account for this density on chest x-ray. No Bochdalek's hernia or lung nodules seen on the prior CT. IMPRESSION: No acute abnormality. Electronically Signed   By: Franchot Gallo M.D.   On: 05/20/2016 14:30      IMPRESSION: Stage II-b squamous cell carcinoma cervix. Patient is now ready to proceed  with intracavitary brachytherapy treatments.  PLAN: Patient will be taken to the operating room and placed under general anesthetic. She will undergo exam under anesthesia and proceed with dilation of the cervical os and placement of tandem ring applicator in preparation for high-dose rate radiation therapy with iridium 192 as the high-dose-rate source. She receive the 5 intracavitary brachytherapy treatments.     ------------------------------------------------  [name], PhD, MD

## 2016-06-02 NOTE — Anesthesia Postprocedure Evaluation (Signed)
Anesthesia Post Note  Patient: Valerie Kerr  Procedure(s) Performed: Procedure(s) (LRB): TANDEM RING INSERTION (N/A)  Patient location during evaluation: PACU Anesthesia Type: General Level of consciousness: awake Pain management: pain level controlled Vital Signs Assessment: post-procedure vital signs reviewed and stable Respiratory status: spontaneous breathing Cardiovascular status: stable Postop Assessment: no signs of nausea or vomiting Anesthetic complications: no       Last Vitals:  Vitals:   06/02/16 0955 06/02/16 1000  BP: (!) 150/88 (!) 145/80  Pulse: 98 93  Resp: 15 14  Temp: 36.3 C     Last Pain:  Vitals:   06/02/16 0628  TempSrc: Oral                 Catheleen Langhorne

## 2016-06-02 NOTE — Discharge Instructions (Signed)

## 2016-06-03 ENCOUNTER — Encounter (HOSPITAL_BASED_OUTPATIENT_CLINIC_OR_DEPARTMENT_OTHER): Payer: Self-pay | Admitting: Radiation Oncology

## 2016-06-03 NOTE — Progress Notes (Signed)
Foley catheter removed intact.  Right wrist IV removed intact.  Pressure and band aid applied.  Gave patient discharge instructions and printout.  She was brought to the lobby in a wheelchair to meet her daughter.

## 2016-06-03 NOTE — Addendum Note (Signed)
Encounter addended by: Jacqulyn Liner, RN on: 06/03/2016  7:41 AM<BR>    Actions taken: Sign clinical note

## 2016-06-06 NOTE — Progress Notes (Signed)
CALLED AND SPOKE TO PT VIA PHONE TODAY.  PT STATED SHE IN [place] BECAUSE HER HUSBAND IS IN CRITICAL CONDITION.  TOLD IF UNABLE TO DO PROCEDURE Thursday 06-09-2016, CALL THE CANCER CENTER , DR [facility] OFFICE. THAT WAY SHE IS ONLY MAKING ONE PHONE CALL.  CALLED AND SPOKE W/ SHIRLEY AT  DR [facility] OFFICE .

## 2016-06-07 ENCOUNTER — Telehealth: Payer: Self-pay | Admitting: Oncology

## 2016-06-07 ENCOUNTER — Encounter: Payer: Self-pay | Admitting: Radiation Oncology

## 2016-06-07 NOTE — Telephone Encounter (Signed)
Called patient regarding her treatment on Thursday.  She said that her ex husband passed away last night and she is not sure she can make it back for treatment this week.  She said she for sure will be back for the treatment on 3/22.  She said she is concerned about the cost for traveling back and forth from [place] to make this Thursday's treatment.

## 2016-06-08 ENCOUNTER — Telehealth: Payer: Self-pay | Admitting: *Deleted

## 2016-06-08 ENCOUNTER — Other Ambulatory Visit: Payer: Self-pay | Admitting: Radiation Oncology

## 2016-06-08 DIAGNOSIS — C539 Malignant neoplasm of cervix uteri, unspecified: Secondary | ICD-10-CM

## 2016-06-08 NOTE — Telephone Encounter (Signed)
Called patient to inform that OR Cases are off for 06-09-16 and 06-13-16, lvm for a return call

## 2016-06-09 ENCOUNTER — Telehealth: Payer: Self-pay | Admitting: Oncology

## 2016-06-09 ENCOUNTER — Ambulatory Visit: Payer: Medicaid Other | Admitting: Radiation Oncology

## 2016-06-09 ENCOUNTER — Encounter (HOSPITAL_BASED_OUTPATIENT_CLINIC_OR_DEPARTMENT_OTHER): Admission: RE | Payer: Self-pay | Source: Ambulatory Visit

## 2016-06-09 ENCOUNTER — Ambulatory Visit (HOSPITAL_COMMUNITY): Payer: Self-pay

## 2016-06-09 ENCOUNTER — Ambulatory Visit (HOSPITAL_BASED_OUTPATIENT_CLINIC_OR_DEPARTMENT_OTHER): Admission: RE | Admit: 2016-06-09 | Payer: Self-pay | Source: Ambulatory Visit | Admitting: Radiation Oncology

## 2016-06-09 SURGERY — INSERTION, UTERINE TANDEM AND RING OR CYLINDER, FOR BRACHYTHERAPY
Anesthesia: General

## 2016-06-09 NOTE — Telephone Encounter (Signed)
Left message for patient regarding appointment changes.  Requested a return call.

## 2016-06-13 ENCOUNTER — Ambulatory Visit (HOSPITAL_BASED_OUTPATIENT_CLINIC_OR_DEPARTMENT_OTHER): Admission: RE | Admit: 2016-06-13 | Payer: Self-pay | Source: Ambulatory Visit | Admitting: Radiation Oncology

## 2016-06-13 ENCOUNTER — Ambulatory Visit: Payer: Medicaid Other | Admitting: Radiation Oncology

## 2016-06-13 ENCOUNTER — Encounter (HOSPITAL_BASED_OUTPATIENT_CLINIC_OR_DEPARTMENT_OTHER): Admission: RE | Payer: Self-pay | Source: Ambulatory Visit

## 2016-06-13 ENCOUNTER — Ambulatory Visit (HOSPITAL_COMMUNITY): Payer: Self-pay

## 2016-06-13 ENCOUNTER — Telehealth: Payer: Self-pay | Admitting: *Deleted

## 2016-06-13 ENCOUNTER — Telehealth: Payer: Self-pay | Admitting: Oncology

## 2016-06-13 SURGERY — INSERTION, UTERINE TANDEM AND RING OR CYLINDER, FOR BRACHYTHERAPY
Anesthesia: General

## 2016-06-13 NOTE — Telephone Encounter (Signed)
Called patient's daughter to ask question, lvm for a return call 

## 2016-06-13 NOTE — Telephone Encounter (Signed)
Called patient to ask question, lvm for a return call 

## 2016-06-13 NOTE — Telephone Encounter (Signed)
Left a message for patient and her daughter who is the emergency contact.  Requested a return call as soon as possible to reschedule her tandem and ring treatments.

## 2016-06-14 ENCOUNTER — Telehealth: Payer: Self-pay | Admitting: Oncology

## 2016-06-14 NOTE — Telephone Encounter (Signed)
Called patient regarding her tandem and ring schedule.  Valerie Kerr said she showed up on Monday and thought she had notified us that she was coming.  Advised her that we did not hear from her so the appointment was canceled.  She said that she probably forgot to call as she was trying to schedule her ex-husband's funeral.  Asked if she received my voicemail messages and she said she had turned her phone off as she was receiving a lot of calls from relatives.  She said she will need to go back to [place] on Thursday for the funeral.  Advised her that Dr. Sondra Come is aware and that we will cancel Thursday's treatment and plan on keeping her next appointment on Monday, 06/20/16.  Valerie Kerr verbalized agreement and said she will be here on Monday, 06/20/16.

## 2016-06-16 ENCOUNTER — Encounter: Payer: Self-pay | Admitting: Radiation Oncology

## 2016-06-16 ENCOUNTER — Ambulatory Visit: Payer: Medicaid Other | Admitting: Radiation Oncology

## 2016-06-16 ENCOUNTER — Encounter (HOSPITAL_BASED_OUTPATIENT_CLINIC_OR_DEPARTMENT_OTHER): Admission: RE | Payer: Self-pay | Source: Ambulatory Visit

## 2016-06-16 ENCOUNTER — Ambulatory Visit (HOSPITAL_BASED_OUTPATIENT_CLINIC_OR_DEPARTMENT_OTHER): Admission: RE | Admit: 2016-06-16 | Payer: Self-pay | Source: Ambulatory Visit | Admitting: Radiation Oncology

## 2016-06-16 ENCOUNTER — Ambulatory Visit (HOSPITAL_COMMUNITY): Payer: Self-pay

## 2016-06-16 SURGERY — INSERTION, UTERINE TANDEM AND RING OR CYLINDER, FOR BRACHYTHERAPY
Anesthesia: General

## 2016-06-17 ENCOUNTER — Encounter (HOSPITAL_BASED_OUTPATIENT_CLINIC_OR_DEPARTMENT_OTHER): Payer: Self-pay | Admitting: *Deleted

## 2016-06-17 NOTE — Progress Notes (Signed)
Pt back in [place].  States no change in health history since last admission.  Instructed pt npo pmn Sunday except prilosec, methadone and xanax if needed w sip of water.  No smoking p [place] as well.  Pt verbalized her understanding.  To [facility] 3/26 @ 0645.  Needs istat on arrival.

## 2016-06-20 ENCOUNTER — Ambulatory Visit
Admission: RE | Admit: 2016-06-20 | Discharge: 2016-06-20 | Disposition: A | Discharge status: Home / Self Care | Payer: Medicaid Other | Source: Ambulatory Visit | Attending: Radiation Oncology | Admitting: Radiation Oncology

## 2016-06-20 ENCOUNTER — Other Ambulatory Visit: Payer: Self-pay | Admitting: *Deleted

## 2016-06-20 ENCOUNTER — Encounter (HOSPITAL_BASED_OUTPATIENT_CLINIC_OR_DEPARTMENT_OTHER)
Admission: RE | Disposition: A | Discharge status: Home / Self Care | Payer: Self-pay | Source: Ambulatory Visit | Attending: Radiation Oncology

## 2016-06-20 ENCOUNTER — Encounter (HOSPITAL_BASED_OUTPATIENT_CLINIC_OR_DEPARTMENT_OTHER): Payer: Self-pay | Admitting: Anesthesiology

## 2016-06-20 ENCOUNTER — Ambulatory Visit (HOSPITAL_COMMUNITY)
Admission: RE | Admit: 2016-06-20 | Discharge: 2016-06-20 | Disposition: A | Discharge status: Home / Self Care | Payer: Self-pay | Source: Ambulatory Visit | Attending: Radiation Oncology | Admitting: Radiation Oncology

## 2016-06-20 ENCOUNTER — Ambulatory Visit (HOSPITAL_BASED_OUTPATIENT_CLINIC_OR_DEPARTMENT_OTHER): Payer: Self-pay | Admitting: Anesthesiology

## 2016-06-20 ENCOUNTER — Ambulatory Visit (HOSPITAL_BASED_OUTPATIENT_CLINIC_OR_DEPARTMENT_OTHER)
Admission: RE | Admit: 2016-06-20 | Discharge: 2016-06-20 | Disposition: A | Discharge status: Home / Self Care | Payer: Self-pay | Source: Ambulatory Visit | Attending: Radiation Oncology | Admitting: Radiation Oncology

## 2016-06-20 VITALS — BP 136/75

## 2016-06-20 DIAGNOSIS — C539 Malignant neoplasm of cervix uteri, unspecified: Secondary | ICD-10-CM | POA: Insufficient documentation

## 2016-06-20 DIAGNOSIS — F418 Other specified anxiety disorders: Secondary | ICD-10-CM

## 2016-06-20 DIAGNOSIS — I10 Essential (primary) hypertension: Secondary | ICD-10-CM | POA: Insufficient documentation

## 2016-06-20 DIAGNOSIS — Z79899 Other long term (current) drug therapy: Secondary | ICD-10-CM | POA: Insufficient documentation

## 2016-06-20 DIAGNOSIS — F17210 Nicotine dependence, cigarettes, uncomplicated: Secondary | ICD-10-CM | POA: Insufficient documentation

## 2016-06-20 DIAGNOSIS — G8929 Other chronic pain: Secondary | ICD-10-CM | POA: Insufficient documentation

## 2016-06-20 DIAGNOSIS — F419 Anxiety disorder, unspecified: Secondary | ICD-10-CM | POA: Insufficient documentation

## 2016-06-20 DIAGNOSIS — K219 Gastro-esophageal reflux disease without esophagitis: Secondary | ICD-10-CM | POA: Insufficient documentation

## 2016-06-20 HISTORY — PX: TANDEM RING INSERTION: SHX6199

## 2016-06-20 LAB — CBC WITH DIFFERENTIAL/PLATELET
Basophils Absolute: 0 10*3/uL (ref 0.0–0.1)
Basophils Relative: 0 %
EOS ABS: 0 10*3/uL (ref 0.0–0.7)
Eosinophils Relative: 1 %
HCT: 31.2 % — ABNORMAL LOW (ref 36.0–46.0)
HEMOGLOBIN: 10.1 g/dL — AB (ref 12.0–15.0)
LYMPHS ABS: 0.9 10*3/uL (ref 0.7–4.0)
Lymphocytes Relative: 23 %
MCH: 28.5 pg (ref 26.0–34.0)
MCHC: 32.4 g/dL (ref 30.0–36.0)
MCV: 87.9 fL (ref 78.0–100.0)
MONOS PCT: 9 %
Monocytes Absolute: 0.3 10*3/uL (ref 0.1–1.0)
NEUTROS ABS: 2.4 10*3/uL (ref 1.7–7.7)
NEUTROS PCT: 67 %
Platelets: 219 10*3/uL (ref 150–400)
RBC: 3.55 MIL/uL — AB (ref 3.87–5.11)
RDW: 18.1 % — ABNORMAL HIGH (ref 11.5–15.5)
WBC: 3.7 10*3/uL — AB (ref 4.0–10.5)

## 2016-06-20 SURGERY — INSERTION, UTERINE TANDEM AND RING OR CYLINDER, FOR BRACHYTHERAPY
Anesthesia: General

## 2016-06-20 MED ORDER — MIDAZOLAM HCL 5 MG/5ML IJ SOLN
INTRAMUSCULAR | Status: DC | PRN
  Administered 2016-06-20: 2 mg via INTRAVENOUS

## 2016-06-20 MED ORDER — DEXAMETHASONE SODIUM PHOSPHATE 10 MG/ML IJ SOLN
INTRAMUSCULAR | Status: AC
  Filled 2016-06-20: qty 1

## 2016-06-20 MED ORDER — ONDANSETRON HCL 4 MG/2ML IJ SOLN
4.0000 mg | Freq: Once | INTRAMUSCULAR | Status: AC | PRN
  Administered 2016-06-20: 4 mg via INTRAVENOUS
  Filled 2016-06-20: qty 2

## 2016-06-20 MED ORDER — LACTATED RINGERS IV SOLN
INTRAVENOUS | Status: DC
  Administered 2016-06-20 (×2): via INTRAVENOUS
  Filled 2016-06-20: qty 1000

## 2016-06-20 MED ORDER — HYDROMORPHONE HCL 4 MG PO TABS: 4.0000 mg | ORAL_TABLET | ORAL | 0 refills | Status: DC | PRN

## 2016-06-20 MED ORDER — HYDROMORPHONE HCL 1 MG/ML IJ SOLN
INTRAMUSCULAR | Status: AC
  Filled 2016-06-20: qty 1

## 2016-06-20 MED ORDER — KETOROLAC TROMETHAMINE 30 MG/ML IJ SOLN
30.0000 mg | Freq: Once | INTRAMUSCULAR | Status: DC
  Filled 2016-06-20: qty 1

## 2016-06-20 MED ORDER — ONDANSETRON HCL 4 MG/2ML IJ SOLN
INTRAMUSCULAR | Status: AC
  Filled 2016-06-20: qty 2

## 2016-06-20 MED ORDER — HYDROMORPHONE HCL 1 MG/ML IJ SOLN
0.5000 mg | Freq: Once | INTRAMUSCULAR | Status: AC
  Administered 2016-06-20: 0.5 mg via INTRAVENOUS
  Filled 2016-06-20: qty 1

## 2016-06-20 MED ORDER — LIDOCAINE HCL (CARDIAC) 20 MG/ML IV SOLN
INTRAVENOUS | Status: DC | PRN
  Administered 2016-06-20: 30 mg via INTRAVENOUS

## 2016-06-20 MED ORDER — DEXAMETHASONE SODIUM PHOSPHATE 4 MG/ML IJ SOLN
INTRAMUSCULAR | Status: DC | PRN
  Administered 2016-06-20: 10 mg via INTRAVENOUS

## 2016-06-20 MED ORDER — FENTANYL CITRATE (PF) 100 MCG/2ML IJ SOLN
INTRAMUSCULAR | Status: DC | PRN
  Administered 2016-06-20 (×2): 50 ug via INTRAVENOUS

## 2016-06-20 MED ORDER — ALPRAZOLAM 0.25 MG PO TABS: 0.2500 mg | ORAL_TABLET | Freq: Two times a day (BID) | ORAL | 0 refills | Status: DC | PRN

## 2016-06-20 MED ORDER — LACTATED RINGERS IV SOLN
INTRAVENOUS | Status: DC
  Administered 2016-06-20: 14:00:00 via INTRAVENOUS
  Filled 2016-06-20 (×2): qty 250

## 2016-06-20 MED ORDER — FENTANYL CITRATE (PF) 100 MCG/2ML IJ SOLN
INTRAMUSCULAR | Status: AC
  Filled 2016-06-20: qty 2

## 2016-06-20 MED ORDER — KETOROLAC TROMETHAMINE 30 MG/ML IJ SOLN
30.0000 mg | Freq: Once | INTRAMUSCULAR | Status: AC | PRN
  Filled 2016-06-20: qty 1

## 2016-06-20 MED ORDER — LIDOCAINE 2% (20 MG/ML) 5 ML SYRINGE
INTRAMUSCULAR | Status: AC
  Filled 2016-06-20: qty 5

## 2016-06-20 MED ORDER — MIDAZOLAM HCL 2 MG/2ML IJ SOLN
INTRAMUSCULAR | Status: AC
  Filled 2016-06-20: qty 2

## 2016-06-20 MED ORDER — HYDROMORPHONE HCL 1 MG/ML IJ SOLN
1.0000 mg | Freq: Once | INTRAMUSCULAR | Status: AC
  Administered 2016-06-20: 1 mg via INTRAVENOUS
  Filled 2016-06-20: qty 1

## 2016-06-20 MED ORDER — PROMETHAZINE HCL 25 MG/ML IJ SOLN
6.2500 mg | INTRAMUSCULAR | Status: DC | PRN
  Filled 2016-06-20: qty 1

## 2016-06-20 MED ORDER — HYDROMORPHONE HCL 1 MG/ML IJ SOLN
0.2500 mg | INTRAMUSCULAR | Status: DC | PRN
Start: 2016-06-20 — End: 2016-06-22
  Administered 2016-06-20: 0.25 mg via INTRAVENOUS
  Filled 2016-06-20: qty 0.5

## 2016-06-20 MED FILL — HYDROmorphone HCL 4 MG TABS: 4 | 2 days supply | Qty: 15 | Fill #0

## 2016-06-20 MED FILL — ALPRAZolam 0.25 MG TABS: 0.25 | 15 days supply | Qty: 30 | Fill #0

## 2016-06-20 SURGICAL SUPPLY — 35 items
BAG URINE DRAINAGE (UROLOGICAL SUPPLIES) ×3 IMPLANT
BNDG CONFORM 2 STRL LF (GAUZE/BANDAGES/DRESSINGS) IMPLANT
CATH FOLEY 2WAY SLVR  5CC 16FR (CATHETERS) ×2
CATH FOLEY 2WAY SLVR 5CC 16FR (CATHETERS) ×1 IMPLANT
COVER BACK TABLE 60X90IN (DRAPES) ×3 IMPLANT
DRAPE LG THREE QUARTER DISP (DRAPES) ×3 IMPLANT
DRAPE UNDERBUTTOCKS STRL (DRAPE) ×3 IMPLANT
GLOVE BIO SURGEON STRL SZ7.5 (GLOVE) ×6 IMPLANT
GOWN STRL REUS W/ TWL LRG LVL3 (GOWN DISPOSABLE) ×2 IMPLANT
GOWN STRL REUS W/TWL LRG LVL3 (GOWN DISPOSABLE) ×6
HOLDER FOLEY CATH W/STRAP (MISCELLANEOUS) ×3 IMPLANT
KIT RM TURNOVER CYSTO AR (KITS) ×3 IMPLANT
LEGGING LITHOTOMY PAIR STRL (DRAPES) ×3 IMPLANT
MANIFOLD NEPTUNE II (INSTRUMENTS) IMPLANT
NDL SPNL 22GX3.5 QUINCKE BK (NEEDLE) IMPLANT
NEEDLE SPNL 22GX3.5 QUINCKE BK (NEEDLE) IMPLANT
PACK BASIN DAY SURGERY FS (CUSTOM PROCEDURE TRAY) ×3 IMPLANT
PACKING VAGINAL (PACKING) IMPLANT
PAD ABD 8X10 STRL (GAUZE/BANDAGES/DRESSINGS) ×3 IMPLANT
PAD OB MATERNITY 4.3X12.25 (PERSONAL CARE ITEMS) IMPLANT
PAD PREP 24X48 CUFFED NSTRL (MISCELLANEOUS) ×3 IMPLANT
PLUG CATH AND CAP STER (CATHETERS) IMPLANT
SET IRRIG Y TYPE TUR BLADDER L (SET/KITS/TRAYS/PACK) ×3 IMPLANT
SUT PROLENE 0 SH 30 (SUTURE) IMPLANT
SUT SILK 2 0 30  PSL (SUTURE)
SUT SILK 2 0 30 PSL (SUTURE) IMPLANT
SYR BULB IRRIGATION 50ML (SYRINGE) IMPLANT
SYR CONTROL 10ML LL (SYRINGE) IMPLANT
SYRINGE 10CC LL (SYRINGE) ×3 IMPLANT
TOWEL OR 17X24 6PK STRL BLUE (TOWEL DISPOSABLE) ×6 IMPLANT
TRAY DSU PREP LF (CUSTOM PROCEDURE TRAY) ×3 IMPLANT
TUBE CONNECTING 12'X1/4 (SUCTIONS)
TUBE CONNECTING 12X1/4 (SUCTIONS) IMPLANT
WATER STERILE IRR 3000ML UROMA (IV SOLUTION) ×3 IMPLANT
WATER STERILE IRR 500ML POUR (IV SOLUTION) ×1 IMPLANT

## 2016-06-20 NOTE — Anesthesia Preprocedure Evaluation (Signed)
Anesthesia Evaluation  Patient identified by MRN, date of birth, ID band Patient awake    Reviewed: Allergy & Precautions, NPO status , Patient's Chart, lab work & pertinent test results  Airway Mallampati: II  TM Distance: >3 FB Neck ROM: Full    Dental no notable dental hx.    Pulmonary Current Smoker,    Pulmonary exam normal breath sounds clear to auscultation       Cardiovascular hypertension, Normal cardiovascular exam Rhythm:Regular Rate:Normal     Neuro/Psych negative neurological ROS  negative psych ROS   GI/Hepatic negative GI ROS, Neg liver ROS,   Endo/Other  negative endocrine ROS  Renal/GU negative Renal ROS  negative genitourinary   Musculoskeletal negative musculoskeletal ROS (+)   Abdominal   Peds negative pediatric ROS (+)  Hematology  (+) anemia ,   Anesthesia Other Findings   Reproductive/Obstetrics negative OB ROS                             Anesthesia Physical Anesthesia Plan  ASA: III  Anesthesia Plan: General   Post-op Pain Management:    Induction: Intravenous  Airway Management Planned: LMA  Additional Equipment:   Intra-op Plan:   Post-operative Plan: Extubation in OR  Informed Consent: I have reviewed the patients History and Physical, chart, labs and discussed the procedure including the risks, benefits and alternatives for the proposed anesthesia with the patient or authorized representative who has indicated his/her understanding and acceptance.   Dental advisory given  Plan Discussed with: CRNA and Surgeon  Anesthesia Plan Comments:         Anesthesia Quick Evaluation

## 2016-06-20 NOTE — Progress Notes (Signed)
IMMEDIATELY FOLLOWING SURGERY: Do not drive or operate machinery for the first twenty four hours after surgery. Do not make any important decisions for twenty four hours after surgery or while taking narcotic pain medications or sedatives. If you develop intractable nausea and vomiting or a severe headache please notify your doctor immediately.   FOLLOW-UP: You do not need to follow up with anesthesia unless specifically instructed to do so.   WOUND CARE INSTRUCTIONS (if applicable): Expect some mild vaginal bleeding, but if large amount of bleeding occurs please contact Dr. Sondra Come at [phone] or the Radiation On-Call physician. Call for any fever greater than 101.0 degrees or increasing vaginal//abdominal pain or trouble urinating.   QUESTIONS?: Please feel free to call your physician or the hospital operator if you have any questions, and they will be happy to assist you. Resume all medications: as listed on your after visit summary. Your next appointment is:  Future Appointments Date Time Provider [place]  06/23/2016 6:30 AM [facility] 1 [facility] [place]  06/23/2016 9:00 AM [name], MD [facility] None  06/23/2016 12:00 PM [name], MD [facility] None  06/29/2016 10:15 AM [facility] 2 [facility] None  06/30/2016 10:15 AM Heath Lark, MD [facility] None  07/04/2016 1:30 PM [name], MD [facility] None  08/15/2016 3:00 PM [name], MD [facility] None

## 2016-06-20 NOTE — H&P (View-Only) (Signed)
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  HISTORY OF PRESENT ILLNESS::Valerie Kerr is a 56 y.o. female who is currently under treatment with external beam radiation therapy and radiosensitizing chemotherapy. The patient has completed approximately 45 gray and will now proceed with intracavitary brachytherapy treatments as part of her overall management.Marland Kitchen    PAST MEDICAL HISTORY:  has a past medical history of Acid reflux; Anxiety; Cervical cancer ([place]); Chronic pain disorder; Dental caries; Environmental allergies; Headache; Herniated disc, cervical; and Hypertension.    PAST SURGICAL HISTORY: Past Surgical History:  Procedure Laterality Date  . ABCESS DRAINAGE Left 1982   breast  . CARPAL TUNNEL RELEASE Bilateral   . CERVICAL DISC SURGERY  2010  . MULTIPLE EXTRACTIONS WITH ALVEOLOPLASTY N/A 04/08/2016   Procedure: Extraction of tooth #'s [phone] with alveoloplasty  and gross debridement of remaining teeth;  Surgeon: Lenn Cal, DDS;  Location: [place];  Service: Oral Surgery;  Laterality: N/A;    FAMILY HISTORY: family history includes Cancer in her brother, brother, father, and sister.  SOCIAL HISTORY:  reports that she has been smoking Cigarettes.  She has a 11.25 pack-year smoking history. She has never used smokeless tobacco. She reports that she does not drink alcohol or use drugs.  ALLERGIES: Codeine; Fentanyl; and Tramadol  MEDICATIONS:  No current facility-administered medications for this encounter.    Current Outpatient Prescriptions  Medication Sig Dispense Refill  . acetaminophen (TYLENOL) 500 MG tablet Take 500 mg by mouth every 6 (six) hours as needed for mild pain.     Marland Kitchen ALPRAZolam (XANAX) 0.25 MG tablet Take 1 tablet (0.25 mg  total) by mouth 2 (two) times daily as needed for anxiety. 30 tablet 0  . fluticasone (FLONASE) 50 MCG/ACT nasal spray Place 1 spray into both nostrils daily. 16 g 1  . ibuprofen (ADVIL,MOTRIN) 600 MG tablet Take 1 tablet by mouth every 6 hours for 24 hours and then as needed for moderate to severe pain. (Patient not taking: Reported on 05/24/2016) 30 tablet 0  . lisinopril-hydrochlorothiazide (PRINZIDE) 10-12.5 MG tablet Take 1 tablet by mouth daily. 30 tablet 3  . Magnesium Oxide -Mg Supplement 400 MG CAPS Take 400 mg by mouth daily. Hold if diarrhea. 30 capsule 0  . methadone (DOLOPHINE) 10 MG/5ML solution Take 55 mg by mouth daily.    Marland Kitchen omeprazole (PRILOSEC) 20 MG capsule Take 1 capsule (20 mg total) by mouth daily. 30 capsule 3  . Phenylephrine-APAP-Guaifenesin (SINUS CONGESTION/PAIN SEVERE PO) Take 1 capsule by mouth daily as needed (sinus pain).    . potassium chloride SA (K-DUR,KLOR-CON) 20 MEQ tablet Take 1 tablet (20 mEq total) by mouth 2 (two) times daily. 14 tablet 0  . prochlorperazine (COMPAZINE) 10 MG tablet Take 1 tablet (10 mg total) by mouth every 6 (six) hours as needed for nausea. (Patient not taking: Reported on 05/24/2016) 20 tablet 0  . simethicone (GAS-X) 80 MG chewable tablet 1 tablet by mouth every 6 hours as needed for burping or flatulence. (Patient not taking: Reported on 05/24/2016) 120 tablet 3    REVIEW OF SYSTEMS:  A 15 point review of systems is documented in the electronic medical record. This was obtained by the nursing staff. However, I reviewed this with the patient to discuss relevant findings and make appropriate  changes.    PHYSICAL EXAM:   height is 5\' 1"  (1.549 m) and weight is 144 lb 9.6 oz (65.6 kg). Her oral temperature is 98.5 F (36.9 C). Her blood pressure is 124/74 and her pulse is 95. Her oxygen saturation is 100%.   General: Alert and oriented, in no acute distress HEENT: Head is normocephalic. Extraocular movements are intact. Oropharynx is  clear. Neck: Neck is supple, no palpable cervical or supraclavicular lymphadenopathy. Heart: Regular in rate and rhythm with no murmurs, rubs, or gallops. Chest: Clear to auscultation bilaterally, with no rhonchi, wheezes, or rales. Abdomen: Soft, nontender, nondistended, with no rigidity or guarding. Extremities: No cyanosis or edema. Lymphatics: see Neck Exam Skin: No concerning lesions. Musculoskeletal: symmetric strength and muscle tone throughout. Neurologic: Cranial nerves II through XII are grossly intact. No obvious focalities. Speech is fluent. Coordination is intact. Psychiatric: Judgment and insight are intact. Affect is appropriate. Pelvic exam to be performed in the operating room pretreatment pelvic exam showed the cervical mass to be approximately 6 cm with bilateral parametrial involvement    LABORATORY DATA:  Lab Results  Component Value Date   WBC 1.5 (L) 05/23/2016   HGB 10.6 (L) 05/23/2016   HCT 32.3 (L) 05/23/2016   MCV 83.9 05/23/2016   PLT 108 (L) 05/23/2016   NEUTROABS 1.1 (L) 05/23/2016   Lab Results  Component Value Date   NA 143 05/23/2016   K 4.1 05/23/2016   CL 100 (L) 04/08/2016   CO2 32 (H) 05/23/2016   GLUCOSE 86 05/23/2016   CREATININE 1.2 (H) 05/23/2016   CALCIUM 8.4 05/23/2016      RADIOGRAPHY: Dg Chest 2 View  Result Date: 05/20/2016 CLINICAL DATA:  [place] EXAM: CHEST  2 VIEW COMPARISON:  12/24/2015 FINDINGS: Heart size and vascularity normal.  Negative for pneumonia. Soft tissue density in the posterior costophrenic angle on the lateral view similar to the prior study. Review of the recent PET-CT 03/29/2016 reveals facet hypertrophy which may account for this density on chest x-ray. No Bochdalek's hernia or lung nodules seen on the prior CT. IMPRESSION: No acute abnormality. Electronically Signed   By: Franchot Gallo M.D.   On: 05/20/2016 14:30      IMPRESSION: Stage II-b squamous cell carcinoma cervix. Patient is now ready to proceed  with intracavitary brachytherapy treatments.  PLAN: Patient will be taken to the operating room and placed under general anesthetic. She will undergo exam under anesthesia and proceed with dilation of the cervical os and placement of tandem ring applicator in preparation for high-dose rate radiation therapy with iridium 192 as the high-dose-rate source. She receive the 5 intracavitary brachytherapy treatments.     ------------------------------------------------  [name], PhD, MD

## 2016-06-20 NOTE — Transfer of Care (Signed)
Immediate Anesthesia Transfer of Care Note  Patient: Freya Zobrist  Procedure(s) Performed: Procedure(s): TANDEM RING INSERTION (N/A)  Patient Location: PACU  Anesthesia Type:General  Level of Consciousness: awake and patient cooperative  Airway & Oxygen Therapy: Patient Spontanous Breathing and Patient connected to nasal cannula oxygen  Post-op Assessment: Report given to RN and Post -op Vital signs reviewed and stable  Post vital signs: Reviewed and stable  Last Vitals:  Vitals:   06/20/16 0739  BP: 122/69  Pulse: 91  Resp: 16  Temp: 36.9 C    Last Pain:  Vitals:   06/20/16 0739  TempSrc: Oral      Patients Stated Pain Goal: 7 (06/29/57 1368)  Complications: No apparent anesthesia complications

## 2016-06-20 NOTE — Progress Notes (Signed)
Foley catheter and left hand IV removed intact.

## 2016-06-20 NOTE — Anesthesia Procedure Notes (Addendum)
Procedure Name: LMA Insertion Date/Time: 06/20/2016 8:41 AM Performed by: Toula Moos L Pre-anesthesia Checklist: Patient identified, Emergency Drugs available, Suction available and Patient being monitored Patient Re-evaluated:Patient Re-evaluated prior to inductionOxygen Delivery Method: Circle system utilized Preoxygenation: Pre-oxygenation with 100% oxygen Intubation Type: IV induction Ventilation: Mask ventilation without difficulty LMA: LMA inserted LMA Size: 4.0 Number of attempts: 1 Airway Equipment and Method: Bite block Placement Confirmation: positive ETCO2 Tube secured with: Tape Dental Injury: Teeth and Oropharynx as per pre-operative assessment

## 2016-06-20 NOTE — Discharge Instructions (Signed)

## 2016-06-20 NOTE — Anesthesia Postprocedure Evaluation (Signed)
Anesthesia Post Note  Patient: Valerie Kerr  Procedure(s) Performed: Procedure(s) (LRB): TANDEM RING INSERTION (N/A)  Patient location during evaluation: PACU Anesthesia Type: General Level of consciousness: awake and alert Pain management: pain level controlled Vital Signs Assessment: post-procedure vital signs reviewed and stable Respiratory status: spontaneous breathing, nonlabored ventilation, respiratory function stable and patient connected to nasal cannula oxygen Cardiovascular status: blood pressure returned to baseline and stable Postop Assessment: no signs of nausea or vomiting Anesthetic complications: no       Last Vitals:  Vitals:   06/20/16 0926 06/20/16 0930  BP: (!) 126/58 118/73  Pulse: (!) 120 (!) 110  Resp: 16 (!) 6  Temp: (!) 35.8 C     Last Pain:  Vitals:   06/20/16 0739  TempSrc: Oral                 [name]

## 2016-06-20 NOTE — Progress Notes (Signed)
Patient reports having pain at a 9/10 in her vaginal area.  0.5 mg dilaudid given IV per Dr. Sondra Come.

## 2016-06-20 NOTE — Progress Notes (Addendum)
Patient reports pain is improving.  LR is now infusing at 175 ml/hr through her #20 gauge left hand IV.  Will continue to monitor.

## 2016-06-20 NOTE — Progress Notes (Signed)
  Radiation Oncology         [phone] ________________________________  Name: Valerie Kerr MRN: [id number]  Date: 06/20/2016  DOB: [birth year]  CC: [name], MD  [name], MD  HDR BRACHYTHERAPY NOTE  DIAGNOSIS: Stage IIB poorly differentiated squamous cell carcinoma of the cervix  NARRATIVE: The patient was brought to the [facility] suite. Identity was confirmed. All relevant records and images related to the planned course of therapy were reviewed. The patient freely provided informed written consent to proceed with treatment after reviewing the details related to the planned course of therapy. The consent form was witnessed and verified by the simulation staff. Then, the patient was set-up in a stable reproducible supine position for radiation therapy. The tandem ring system was accessed and fiducial markers were placed within the tandem and ring.   Simple treatment device note: On the operating room the patient had construction of her custom tandem ring system. She will be treated with a 45 tandem/ring system. The patient had placement of a 60 mm tandem. A cervical ring with a small shielding was used for her treatment. A rectal paddle was also part of her custom set up device.  Verification simulation note: An AP and lateral film was obtained through the pelvis area. This was compared to the patient's planning films documenting accurate position of the tandem/ring system for treatment.  High-dose-rate brachytherapy treatment note:  The remote afterloading device was accessed through catheter system and attached to the tandem ring system. Patient then proceeded to undergo her third high-dose-rate treatment directed at the cervix. The patient was prescribed a dose of 5.5 gray to be delivered to the high risk CTV.Marland Kitchen Patient was treated with 2 channels using multiple dwell positions. Treatment time was 596.8 seconds. The patient tolerated the procedure well. After completion of her  therapy, a radiation survey was performed documenting return of the iridium source into the [employer] safe. The patient was then transferred to the nursing suite. She then had removal of the rectal paddle followed by the tandem and ring system. The patient tolerated the removal well.  PLAN: The patient will return later this week for the 4th HDR treatment.  -----------------------------------  [name], PhD, MD  This document serves as a record of services personally performed by [name], MD. It was created on his behalf by Bethann Humble, a trained medical scribe. The creation of this record is based on the scribe's personal observations and the provider's statements to them. This document has been checked and approved by the attending provider.

## 2016-06-20 NOTE — Interval H&P Note (Signed)
History and Physical Interval Note:  06/20/2016 8:24 AM  Valerie Kerr  has presented today for surgery, with the diagnosis of CERVICAL CANCER  The various methods of treatment have been discussed with the patient and family. After consideration of risks, benefits and other options for treatment, the patient has consented to  Procedure(s): TANDEM RING INSERTION (N/A) as a surgical intervention .  The patient's history has been reviewed, patient examined, no change in status, stable for surgery.  I have reviewed the patient's chart and labs.  Questions were answered to the patient's satisfaction.     [name]

## 2016-06-20 NOTE — Progress Notes (Signed)
  Radiation Oncology         [phone] ________________________________  Name: Valerie Kerr MRN: [id number]  Date: 06/20/2016  DOB: August 31, 1960  SIMULATION AND TREATMENT PLANNING NOTE HDR BRACHYTHERAPY  DIAGNOSIS:  : Stage IIB poorly differentiated squamous cell carcinoma of the cervix   NARRATIVE:  The patient was brought to the [place].  Identity was confirmed.  All relevant records and images related to the planned course of therapy were reviewed.  The patient freely provided informed written consent to proceed with treatment after reviewing the details related to the planned course of therapy. The consent form was witnessed and verified by the simulation staff.  Then, the patient was set-up in a stable reproducible  supine position for radiation therapy.  CT images were obtained.  Surface markings were placed.  The CT images were loaded into the planning software.  Then the target and avoidance structures were contoured.  Treatment planning then occurred.  The radiation prescription was entered and confirmed.   I have requested : Brachytherapy Isodose Plan and Dosimetry Calculations to plan the radiation distribution.    PLAN:  The patient will receive 5.5 Gy in 1 fraction directed at the high-risk CTV. Patient will be treated with iridium 192 as the high-dose-rate source. The patient will be treated with a tandem ring system  ________________________________  [name], PhD, MD   This document serves as a record of services personally performed by [name], MD. It was created on his behalf by Bethann Humble, a trained medical scribe. The creation of this record is based on the scribe's personal observations and the provider's statements to them. This document has been checked and approved by the attending provider.

## 2016-06-20 NOTE — Progress Notes (Signed)
Patient reports having pain at a 10/10.  1 mg dilaudid given IV per Dr. Sondra Come.

## 2016-06-20 NOTE — Brief Op Note (Signed)
06/20/2016  9:45 AM  PATIENT:  Valerie Kerr  56 y.o. female  PRE-OPERATIVE DIAGNOSIS:  CERVICAL CANCER  POST-OPERATIVE DIAGNOSIS:  CERVICAL CANCER  PROCEDURE:  Procedure(s): TANDEM RING INSERTION (N/A)  SURGEON:  Surgeon(s) and Role:    * [name], MD - Primary  PHYSICIAN ASSISTANT:   ASSISTANTS: none   ANESTHESIA:   general  EBL:  Total I/O In: 1000 [I.V.:1000] Out: 4 [Blood:4]  BLOOD ADMINISTERED:none  DRAINS: Urinary Catheter (Foley)   LOCAL MEDICATIONS USED:  NONE  SPECIMEN:  No Specimen  DISPOSITION OF SPECIMEN:  N/A  COUNTS:  YES  TOURNIQUET:  * No tourniquets in log *  DICTATION: The patient was taken to the outpatient OR #4. Timeout was performed for the procedure, length of anesthesia, preoperative antibiotics. She was prepped and draped in the usual sterile fashion and placed in the dorsal lithotomy position. Exam under anesthesia revealed excellent response to her external beam radiation therapy and radiosensitizing chemotherapy. The cervical mass was estimated to be approximately 2.0 x 2.5 cm in size. There was no parametrial extension noted on exam. The cervical mass continued to protrude into the upper vaginal fault was not a attachedto the upper vaginal mucosa. The bladder was filled with approximately 200 mL of sterile water to aid in ultrasound imaging purposes .Patient proceeded to undergo dilation and sounding. Theuterus was noted to be quite thin and was noted to be an anteverted oriented towards the patient's left position with intraoperative ultrasound imaging. The uterus sounded to approximately 8-10 cm. Despite several attempts with dilation a cervical sleeve could not be placed. The patient then had placement of a 45 60 mm tandem within the uterine canal. Intraoperative ultrasound images verified good placement. The uterus was noted to be deviated significantly to the left of the patient. thepatient then had placement of a 45 ring attached  to the tandem. Patient then had placement of a rectal paddle to push the rectal mucosa out of the high-dose radiation field. Intraoperative ultrasound at the end of the procedure verified accurate placement of the equipment. The tandem was noted to be in the endometrial canal. Patient tolerated the procedure well. The patient was transported to recovery room in stable condition. Later in the day the patient will be transported to radiation oncology for planning and her third high-dose-rate intracavitary brachytherapy treatment with iridium 192 as the high-dose-rate source.  PLAN OF CARE: Transferred to radiation oncology for planning and treatment  PATIENT DISPOSITION:  PACU - hemodynamically stable.   Delay start of Pharmacological VTE agent (>24hrs) due to surgical blood loss or risk of bleeding: not applicable

## 2016-06-21 ENCOUNTER — Encounter (HOSPITAL_BASED_OUTPATIENT_CLINIC_OR_DEPARTMENT_OTHER): Payer: Self-pay | Admitting: Radiation Oncology

## 2016-06-22 ENCOUNTER — Encounter (HOSPITAL_BASED_OUTPATIENT_CLINIC_OR_DEPARTMENT_OTHER): Payer: Self-pay | Admitting: *Deleted

## 2016-06-22 NOTE — Progress Notes (Signed)
NPO AFTER MN.  ARRIVE AT 0600.  CURRENT LAB RESULTS AND EKG IN CHART AND EPIC.  WILL TAKE PRILOSEC, METHADONE, AND XANAX AM DOS W/ SIPS OF WATER.

## 2016-06-23 ENCOUNTER — Encounter (HOSPITAL_BASED_OUTPATIENT_CLINIC_OR_DEPARTMENT_OTHER): Payer: Self-pay | Admitting: *Deleted

## 2016-06-23 ENCOUNTER — Ambulatory Visit (HOSPITAL_BASED_OUTPATIENT_CLINIC_OR_DEPARTMENT_OTHER)
Admission: RE | Admit: 2016-06-23 | Discharge: 2016-06-23 | Disposition: A | Discharge status: Home / Self Care | Payer: Self-pay | Source: Ambulatory Visit | Attending: Radiation Oncology | Admitting: Radiation Oncology

## 2016-06-23 ENCOUNTER — Encounter (HOSPITAL_BASED_OUTPATIENT_CLINIC_OR_DEPARTMENT_OTHER)
Admission: RE | Disposition: A | Discharge status: Home / Self Care | Payer: Self-pay | Source: Ambulatory Visit | Attending: Radiation Oncology

## 2016-06-23 ENCOUNTER — Ambulatory Visit (HOSPITAL_COMMUNITY)
Admission: RE | Admit: 2016-06-23 | Discharge: 2016-06-23 | Disposition: A | Discharge status: Home / Self Care | Payer: Self-pay | Source: Ambulatory Visit | Attending: Radiation Oncology | Admitting: Radiation Oncology

## 2016-06-23 ENCOUNTER — Ambulatory Visit
Admission: RE | Admit: 2016-06-23 | Discharge: 2016-06-23 | Disposition: A | Discharge status: Home / Self Care | Payer: Medicaid Other | Source: Ambulatory Visit | Attending: Radiation Oncology | Admitting: Radiation Oncology

## 2016-06-23 ENCOUNTER — Ambulatory Visit (HOSPITAL_BASED_OUTPATIENT_CLINIC_OR_DEPARTMENT_OTHER): Payer: Self-pay | Admitting: Anesthesiology

## 2016-06-23 VITALS — BP 144/75 | HR 74

## 2016-06-23 DIAGNOSIS — Z79899 Other long term (current) drug therapy: Secondary | ICD-10-CM | POA: Insufficient documentation

## 2016-06-23 DIAGNOSIS — K219 Gastro-esophageal reflux disease without esophagitis: Secondary | ICD-10-CM | POA: Insufficient documentation

## 2016-06-23 DIAGNOSIS — G8929 Other chronic pain: Secondary | ICD-10-CM | POA: Insufficient documentation

## 2016-06-23 DIAGNOSIS — C539 Malignant neoplasm of cervix uteri, unspecified: Secondary | ICD-10-CM

## 2016-06-23 DIAGNOSIS — F17210 Nicotine dependence, cigarettes, uncomplicated: Secondary | ICD-10-CM | POA: Insufficient documentation

## 2016-06-23 DIAGNOSIS — F419 Anxiety disorder, unspecified: Secondary | ICD-10-CM | POA: Insufficient documentation

## 2016-06-23 DIAGNOSIS — I10 Essential (primary) hypertension: Secondary | ICD-10-CM | POA: Insufficient documentation

## 2016-06-23 DIAGNOSIS — Z791 Long term (current) use of non-steroidal anti-inflammatories (NSAID): Secondary | ICD-10-CM | POA: Insufficient documentation

## 2016-06-23 HISTORY — PX: TANDEM RING INSERTION: SHX6199

## 2016-06-23 SURGERY — INSERTION, UTERINE TANDEM AND RING OR CYLINDER, FOR BRACHYTHERAPY
Anesthesia: General | Site: Cervix

## 2016-06-23 MED ORDER — ONDANSETRON HCL 4 MG/2ML IJ SOLN
4.0000 mg | Freq: Once | INTRAMUSCULAR | Status: DC | PRN
  Filled 2016-06-23: qty 2

## 2016-06-23 MED ORDER — HYDROMORPHONE HCL 1 MG/ML IJ SOLN
INTRAMUSCULAR | Status: AC
  Filled 2016-06-23: qty 1

## 2016-06-23 MED ORDER — KETOROLAC TROMETHAMINE 30 MG/ML IJ SOLN
30.0000 mg | Freq: Once | INTRAMUSCULAR | Status: DC | PRN
  Filled 2016-06-23: qty 1

## 2016-06-23 MED ORDER — DEXAMETHASONE SODIUM PHOSPHATE 10 MG/ML IJ SOLN
INTRAMUSCULAR | Status: AC
  Filled 2016-06-23: qty 1

## 2016-06-23 MED ORDER — LIDOCAINE 2% (20 MG/ML) 5 ML SYRINGE
INTRAMUSCULAR | Status: DC | PRN
  Administered 2016-06-23: 100 mg via INTRAVENOUS

## 2016-06-23 MED ORDER — HYDROMORPHONE HCL 1 MG/ML IJ SOLN
1.0000 mg | Freq: Once | INTRAMUSCULAR | Status: AC
  Administered 2016-06-23: 1 mg via INTRAVENOUS
  Filled 2016-06-23: qty 1

## 2016-06-23 MED ORDER — KETOROLAC TROMETHAMINE 30 MG/ML IJ SOLN
INTRAMUSCULAR | Status: DC | PRN
  Administered 2016-06-23: 30 mg via INTRAVENOUS

## 2016-06-23 MED ORDER — DEXAMETHASONE SODIUM PHOSPHATE 4 MG/ML IJ SOLN
INTRAMUSCULAR | Status: DC | PRN
  Administered 2016-06-23: 10 mg via INTRAVENOUS

## 2016-06-23 MED ORDER — ACETAMINOPHEN 10 MG/ML IV SOLN
INTRAVENOUS | Status: DC | PRN
  Administered 2016-06-23: 1000 mg via INTRAVENOUS

## 2016-06-23 MED ORDER — ONDANSETRON HCL 4 MG/2ML IJ SOLN
INTRAMUSCULAR | Status: DC | PRN
  Administered 2016-06-23: 4 mg via INTRAVENOUS

## 2016-06-23 MED ORDER — ALBUTEROL SULFATE HFA 108 (90 BASE) MCG/ACT IN AERS
INHALATION_SPRAY | RESPIRATORY_TRACT | Status: DC | PRN
  Administered 2016-06-23 (×2): 4 via RESPIRATORY_TRACT

## 2016-06-23 MED ORDER — MIDAZOLAM HCL 2 MG/2ML IJ SOLN
INTRAMUSCULAR | Status: AC
  Filled 2016-06-23: qty 2

## 2016-06-23 MED ORDER — ACETAMINOPHEN 10 MG/ML IV SOLN
INTRAVENOUS | Status: AC
  Filled 2016-06-23: qty 100

## 2016-06-23 MED ORDER — LACTATED RINGERS IV SOLN
INTRAVENOUS | Status: DC
  Administered 2016-06-23: 06:00:00 via INTRAVENOUS
  Filled 2016-06-23: qty 1000

## 2016-06-23 MED ORDER — LACTATED RINGERS IV SOLN
INTRAVENOUS | Status: DC
  Administered 2016-06-23: 16:00:00 via INTRAVENOUS
  Filled 2016-06-23 (×2): qty 250

## 2016-06-23 MED ORDER — MIDAZOLAM HCL 5 MG/5ML IJ SOLN
INTRAMUSCULAR | Status: DC | PRN
  Administered 2016-06-23: 2 mg via INTRAVENOUS

## 2016-06-23 MED ORDER — LIDOCAINE 2% (20 MG/ML) 5 ML SYRINGE
INTRAMUSCULAR | Status: AC
  Filled 2016-06-23: qty 5

## 2016-06-23 MED ORDER — ARTIFICIAL TEARS OP OINT
TOPICAL_OINTMENT | OPHTHALMIC | Status: AC
  Filled 2016-06-23: qty 3.5

## 2016-06-23 MED ORDER — KETOROLAC TROMETHAMINE 30 MG/ML IJ SOLN
INTRAMUSCULAR | Status: AC
Start: 2016-06-23 — End: 2016-06-23
  Filled 2016-06-23: qty 1

## 2016-06-23 MED ORDER — ALBUTEROL SULFATE HFA 108 (90 BASE) MCG/ACT IN AERS
INHALATION_SPRAY | RESPIRATORY_TRACT | Status: AC
  Filled 2016-06-23: qty 6.7

## 2016-06-23 MED ORDER — ONDANSETRON HCL 4 MG/2ML IJ SOLN
INTRAMUSCULAR | Status: AC
  Filled 2016-06-23: qty 2

## 2016-06-23 MED ORDER — HYDROMORPHONE HCL 2 MG/ML IJ SOLN
INTRAMUSCULAR | Status: AC
  Filled 2016-06-23: qty 1

## 2016-06-23 MED ORDER — HYDROMORPHONE HCL 1 MG/ML IJ SOLN
0.5000 mg | Freq: Once | INTRAMUSCULAR | Status: AC
  Administered 2016-06-23: 0.5 mg via INTRAVENOUS
  Filled 2016-06-23: qty 1

## 2016-06-23 MED ORDER — PROPOFOL 10 MG/ML IV BOLUS
INTRAVENOUS | Status: AC
  Filled 2016-06-23: qty 40

## 2016-06-23 MED ORDER — PROPOFOL 10 MG/ML IV BOLUS
INTRAVENOUS | Status: DC | PRN
  Administered 2016-06-23: 100 mg via INTRAVENOUS
  Administered 2016-06-23: 200 mg via INTRAVENOUS

## 2016-06-23 MED ORDER — WATER FOR IRRIGATION, STERILE IR SOLN
Status: DC | PRN
  Administered 2016-06-23: 200 mL

## 2016-06-23 SURGICAL SUPPLY — 36 items
BAG URINE DRAINAGE (UROLOGICAL SUPPLIES) ×3 IMPLANT
BNDG CONFORM 2 STRL LF (GAUZE/BANDAGES/DRESSINGS) IMPLANT
CATH FOLEY 2WAY SLVR  5CC 16FR (CATHETERS) ×2
CATH FOLEY 2WAY SLVR 5CC 16FR (CATHETERS) ×1 IMPLANT
COVER BACK TABLE 60X90IN (DRAPES) ×3 IMPLANT
DRAPE LG THREE QUARTER DISP (DRAPES) ×3 IMPLANT
DRAPE UNDERBUTTOCKS STRL (DRAPE) ×3 IMPLANT
DRSG PAD ABDOMINAL 8X10 ST (GAUZE/BANDAGES/DRESSINGS) ×2 IMPLANT
GLOVE BIO SURGEON STRL SZ7.5 (GLOVE) ×6 IMPLANT
GOWN STRL REUS W/ TWL LRG LVL3 (GOWN DISPOSABLE) ×2 IMPLANT
GOWN STRL REUS W/TWL LRG LVL3 (GOWN DISPOSABLE) ×6
HOLDER FOLEY CATH W/STRAP (MISCELLANEOUS) ×3 IMPLANT
KIT RM TURNOVER CYSTO AR (KITS) ×3 IMPLANT
LEGGING LITHOTOMY PAIR STRL (DRAPES) ×3 IMPLANT
MANIFOLD NEPTUNE II (INSTRUMENTS) IMPLANT
NDL SPNL 22GX3.5 QUINCKE BK (NEEDLE) IMPLANT
NEEDLE SPNL 22GX3.5 QUINCKE BK (NEEDLE) IMPLANT
PACK BASIN DAY SURGERY FS (CUSTOM PROCEDURE TRAY) ×3 IMPLANT
PACKING VAGINAL (PACKING) IMPLANT
PAD ABD 8X10 STRL (GAUZE/BANDAGES/DRESSINGS) ×3 IMPLANT
PAD OB MATERNITY 4.3X12.25 (PERSONAL CARE ITEMS) IMPLANT
PAD PREP 24X48 CUFFED NSTRL (MISCELLANEOUS) ×3 IMPLANT
PLUG CATH AND CAP STER (CATHETERS) IMPLANT
SET IRRIG Y TYPE TUR BLADDER L (SET/KITS/TRAYS/PACK) ×3 IMPLANT
SUT PROLENE 0 SH 30 (SUTURE) IMPLANT
SUT SILK 2 0 30  PSL (SUTURE)
SUT SILK 2 0 30 PSL (SUTURE) IMPLANT
SYR BULB IRRIGATION 50ML (SYRINGE) IMPLANT
SYR CONTROL 10ML LL (SYRINGE) IMPLANT
SYRINGE 10CC LL (SYRINGE) ×3 IMPLANT
TOWEL OR 17X24 6PK STRL BLUE (TOWEL DISPOSABLE) ×6 IMPLANT
TRAY DSU PREP LF (CUSTOM PROCEDURE TRAY) ×3 IMPLANT
TUBE CONNECTING 12'X1/4 (SUCTIONS)
TUBE CONNECTING 12X1/4 (SUCTIONS) IMPLANT
WATER STERILE IRR 3000ML UROMA (IV SOLUTION) ×3 IMPLANT
WATER STERILE IRR 500ML POUR (IV SOLUTION) ×3 IMPLANT

## 2016-06-23 NOTE — Anesthesia Procedure Notes (Signed)
Procedure Name: LMA Insertion Date/Time: 06/23/2016 7:40 AM Performed by: Linna Caprice, DAVID Pre-anesthesia Checklist: Patient identified, Emergency Drugs available, Suction available and Patient being monitored Patient Re-evaluated:Patient Re-evaluated prior to inductionOxygen Delivery Method: Circle system utilized Preoxygenation: Pre-oxygenation with 100% oxygen Intubation Type: IV induction Ventilation: Mask ventilation without difficulty LMA: LMA inserted LMA Size: 4.0 Number of attempts: 1 Airway Equipment and Method: Bite block Placement Confirmation: positive ETCO2 Tube secured with: Tape Dental Injury: Teeth and Oropharynx as per pre-operative assessment

## 2016-06-23 NOTE — Progress Notes (Signed)
  Radiation Oncology         [phone] ________________________________  Name: Valerie Kerr MRN: [id number]  Date: 06/23/2016  DOB: Oct 15, 1960  CC: [name], MD  [name], MD  HDR BRACHYTHERAPY NOTE  DIAGNOSIS: Stage IIB poorly differentiated squamous cell carcinoma of the cervix  NARRATIVE: The patient was brought to the [place] suite. Identity was confirmed. All relevant records and images related to the planned course of therapy were reviewed. The patient freely provided informed written consent to proceed with treatment after reviewing the details related to the planned course of therapy. The consent form was witnessed and verified by the simulation staff. Then, the patient was set-up in a stable reproducible supine position for radiation therapy. The tandem ring system was accessed and fiducial markers were placed within the tandem and ring.   Simple treatment device note: On the operating room the patient had construction of her custom tandem ring system. She will be treated with a 45 tandem/ring system. The patient had placement of a 60 mm tandem. A cervical ring with a small shielding was used for her treatment. A rectal paddle was also part of her custom set up device.  Verification simulation note: An AP and lateral film was obtained through the pelvis area. This was compared to the patient's planning films documenting accurate position of the tandem/ring system for treatment.  High-dose-rate brachytherapy treatment note:  The remote afterloading device was accessed through catheter system and attached to the tandem ring system. Patient then proceeded to undergo her fourth high-dose-rate treatment directed at the cervix. The patient was prescribed a dose of 5.5 gray to be delivered to the high risk CTV. Patient was treated with 2 channels using multiple dwell positions. Treatment time was 636.8 seconds. The patient tolerated the procedure well. After completion of her  therapy, a radiation survey was performed documenting return of the iridium source into the [employer] safe. The patient was then transferred to the nursing suite. She then had removal of the rectal paddle followed by the tandem and ring system. The patient tolerated the removal well.  PLAN: The patient will return 2 weeks for the fifth HDR treatment.  -----------------------------------  [name], PhD, MD  This document serves as a record of services personally performed by [name], MD. It was created on his behalf by Maryla Morrow, a trained medical scribe. The creation of this record is based on the scribe's personal observations and the provider's statements to them. This document has been checked and approved by the attending provider.

## 2016-06-23 NOTE — Transfer of Care (Signed)
  Last Vitals:  Vitals:   06/23/16 0603 06/23/16 0830  BP: 122/76   Pulse: 87   Resp: 16 (!) (P) 8  Temp: 36.8 C     Last Pain:  Vitals:   06/23/16 0603  TempSrc: Oral      Patients Stated Pain Goal: 6 (06/23/16 7948)  Immediate Anesthesia Transfer of Care Note  Patient: Valerie Kerr  Procedure(s) Performed: Procedure(s) (LRB): TANDEM RING INSERTION (N/A)  Patient Location: PACU  Anesthesia Type: General  Level of Consciousness: awake, alert  and oriented  Airway & Oxygen Therapy: Patient Spontanous Breathing and Patient connected to nasal cannula oxygen  Post-op Assessment: Report given to PACU RN and Post -op Vital signs reviewed and stable  Post vital signs: Reviewed and stable  Complications: No apparent anesthesia complications

## 2016-06-23 NOTE — Brief Op Note (Signed)
06/23/2016  8:59 AM  PATIENT:  Valerie Kerr  56 y.o. female  PRE-OPERATIVE DIAGNOSIS:  CERVICAL CANCER  POST-OPERATIVE DIAGNOSIS:  CERVICAL CANCER  PROCEDURE:  Procedure(s): TANDEM RING INSERTION (N/A)  SURGEON:  Surgeon(s) and Role:    * [name], MD - Primary  PHYSICIAN ASSISTANT:   ASSISTANTS: none   ANESTHESIA:   general  EBL:  Total I/O In: 1000 [I.V.:1000] Out: 0   BLOOD ADMINISTERED:none  DRAINS: Urinary Catheter (Foley)   LOCAL MEDICATIONS USED:  NONE  SPECIMEN:  No Specimen  DISPOSITION OF SPECIMEN:  N/A  COUNTS:  YES  TOURNIQUET:  * No tourniquets in log *  DICTATION: The patient was taken to the outpatient OR #4. Timeout was performed for the procedure, length of anesthesia, preoperative antibiotics.She was prepped and draped in the usual sterile fashion and placed in the dorsal lithotomy position. Exam under anesthesia revealed excellent response to her external beam radiation therapy and radiosensitizing chemotherapy. The cervical mass was estimated to be approximately 2.0x 2.3 cm in size. There was no parametrial extension noted on exam. The cervical mass continued to protrude into the upper vaginal fault was not a attachedto the upper vaginal mucosa. The bladder was filled with approximately 200 mL of sterile water to aid in ultrasound imaging purposes .Patient proceeded to undergo dilation and sounding. Theuterus was noted to be quite thin and was noted to be an anteverted oriented towards the patient's left position with intraoperative ultrasound imaging. The uterus sounded to approximately 8-10cm. The patient then had placement of a 45 60 mm tandem within the uterine canal. Intraoperative ultrasound images verified good placement. The uterus was noted to be deviated significantly to the left of the patient. thepatient then had placement of a 45 ring with small shielding cap attached to the tandem. Patient then had placement of a rectal paddle  to push the rectal mucosa out of the high-dose radiation field. Intraoperative ultrasound at the end of the procedure verified accurate placement of the equipment.The tandem was noted to be in the endometrial canal.Patient tolerated the procedure well. The patient was transported to recovery room in stable condition. Later in the day the patient will be transported to radiation oncology for planning and her fourth high-dose-rate intracavitary brachytherapy treatment with iridium 192 as the high-dose-rate source.  PLAN OF CARE: Transferred to radiation oncology for planning and treatment  PATIENT DISPOSITION:  PACU - hemodynamically stable.   Delay start of Pharmacological VTE agent (>24hrs) due to surgical blood loss or risk of bleeding: not applicable

## 2016-06-23 NOTE — H&P (View-Only) (Signed)
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  HISTORY OF PRESENT ILLNESS::Valerie Kerr is a 56 y.o. female who is currently under treatment with external beam radiation therapy and radiosensitizing chemotherapy. The patient has completed approximately 45 gray and will now proceed with intracavitary brachytherapy treatments as part of her overall management.Marland Kitchen    PAST MEDICAL HISTORY:  has a past medical history of Acid reflux; Anxiety; Cervical cancer ([place]); Chronic pain disorder; Dental caries; Environmental allergies; Headache; Herniated disc, cervical; and Hypertension.    PAST SURGICAL HISTORY: Past Surgical History:  Procedure Laterality Date  . ABCESS DRAINAGE Left 1982   breast  . CARPAL TUNNEL RELEASE Bilateral   . CERVICAL DISC SURGERY  2010  . MULTIPLE EXTRACTIONS WITH ALVEOLOPLASTY N/A 04/08/2016   Procedure: Extraction of tooth #'s [phone] with alveoloplasty  and gross debridement of remaining teeth;  Surgeon: Lenn Cal, DDS;  Location: [place];  Service: Oral Surgery;  Laterality: N/A;    FAMILY HISTORY: family history includes Cancer in her brother, brother, father, and sister.  SOCIAL HISTORY:  reports that she has been smoking Cigarettes.  She has a 11.25 pack-year smoking history. She has never used smokeless tobacco. She reports that she does not drink alcohol or use drugs.  ALLERGIES: Codeine; Fentanyl; and Tramadol  MEDICATIONS:  No current facility-administered medications for this encounter.    Current Outpatient Prescriptions  Medication Sig Dispense Refill  . acetaminophen (TYLENOL) 500 MG tablet Take 500 mg by mouth every 6 (six) hours as needed for mild pain.     Marland Kitchen ALPRAZolam (XANAX) 0.25 MG tablet Take 1 tablet (0.25 mg  total) by mouth 2 (two) times daily as needed for anxiety. 30 tablet 0  . fluticasone (FLONASE) 50 MCG/ACT nasal spray Place 1 spray into both nostrils daily. 16 g 1  . ibuprofen (ADVIL,MOTRIN) 600 MG tablet Take 1 tablet by mouth every 6 hours for 24 hours and then as needed for moderate to severe pain. (Patient not taking: Reported on 05/24/2016) 30 tablet 0  . lisinopril-hydrochlorothiazide (PRINZIDE) 10-12.5 MG tablet Take 1 tablet by mouth daily. 30 tablet 3  . Magnesium Oxide -Mg Supplement 400 MG CAPS Take 400 mg by mouth daily. Hold if diarrhea. 30 capsule 0  . methadone (DOLOPHINE) 10 MG/5ML solution Take 55 mg by mouth daily.    Marland Kitchen omeprazole (PRILOSEC) 20 MG capsule Take 1 capsule (20 mg total) by mouth daily. 30 capsule 3  . Phenylephrine-APAP-Guaifenesin (SINUS CONGESTION/PAIN SEVERE PO) Take 1 capsule by mouth daily as needed (sinus pain).    . potassium chloride SA (K-DUR,KLOR-CON) 20 MEQ tablet Take 1 tablet (20 mEq total) by mouth 2 (two) times daily. 14 tablet 0  . prochlorperazine (COMPAZINE) 10 MG tablet Take 1 tablet (10 mg total) by mouth every 6 (six) hours as needed for nausea. (Patient not taking: Reported on 05/24/2016) 20 tablet 0  . simethicone (GAS-X) 80 MG chewable tablet 1 tablet by mouth every 6 hours as needed for burping or flatulence. (Patient not taking: Reported on 05/24/2016) 120 tablet 3    REVIEW OF SYSTEMS:  A 15 point review of systems is documented in the electronic medical record. This was obtained by the nursing staff. However, I reviewed this with the patient to discuss relevant findings and make appropriate  changes.    PHYSICAL EXAM:   height is 5\' 1"  (1.549 m) and weight is 144 lb 9.6 oz (65.6 kg). Her oral temperature is 98.5 F (36.9 C). Her blood pressure is 124/74 and her pulse is 95. Her oxygen saturation is 100%.   General: Alert and oriented, in no acute distress HEENT: Head is normocephalic. Extraocular movements are intact. Oropharynx is  clear. Neck: Neck is supple, no palpable cervical or supraclavicular lymphadenopathy. Heart: Regular in rate and rhythm with no murmurs, rubs, or gallops. Chest: Clear to auscultation bilaterally, with no rhonchi, wheezes, or rales. Abdomen: Soft, nontender, nondistended, with no rigidity or guarding. Extremities: No cyanosis or edema. Lymphatics: see Neck Exam Skin: No concerning lesions. Musculoskeletal: symmetric strength and muscle tone throughout. Neurologic: Cranial nerves II through XII are grossly intact. No obvious focalities. Speech is fluent. Coordination is intact. Psychiatric: Judgment and insight are intact. Affect is appropriate. Pelvic exam to be performed in the operating room pretreatment pelvic exam showed the cervical mass to be approximately 6 cm with bilateral parametrial involvement    LABORATORY DATA:  Lab Results  Component Value Date   WBC 1.5 (L) 05/23/2016   HGB 10.6 (L) 05/23/2016   HCT 32.3 (L) 05/23/2016   MCV 83.9 05/23/2016   PLT 108 (L) 05/23/2016   NEUTROABS 1.1 (L) 05/23/2016   Lab Results  Component Value Date   NA 143 05/23/2016   K 4.1 05/23/2016   CL 100 (L) 04/08/2016   CO2 32 (H) 05/23/2016   GLUCOSE 86 05/23/2016   CREATININE 1.2 (H) 05/23/2016   CALCIUM 8.4 05/23/2016      RADIOGRAPHY: Dg Chest 2 View  Result Date: 05/20/2016 CLINICAL DATA:  [place] EXAM: CHEST  2 VIEW COMPARISON:  12/24/2015 FINDINGS: Heart size and vascularity normal.  Negative for pneumonia. Soft tissue density in the posterior costophrenic angle on the lateral view similar to the prior study. Review of the recent PET-CT 03/29/2016 reveals facet hypertrophy which may account for this density on chest x-ray. No Bochdalek's hernia or lung nodules seen on the prior CT. IMPRESSION: No acute abnormality. Electronically Signed   By: Franchot Gallo M.D.   On: 05/20/2016 14:30      IMPRESSION: Stage II-b squamous cell carcinoma cervix. Patient is now ready to proceed  with intracavitary brachytherapy treatments.  PLAN: Patient will be taken to the operating room and placed under general anesthetic. She will undergo exam under anesthesia and proceed with dilation of the cervical os and placement of tandem ring applicator in preparation for high-dose rate radiation therapy with iridium 192 as the high-dose-rate source. She receive the 5 intracavitary brachytherapy treatments.     ------------------------------------------------  [name], PhD, MD

## 2016-06-23 NOTE — Anesthesia Preprocedure Evaluation (Addendum)
Anesthesia Evaluation  Patient identified by MRN, date of birth, ID band Patient awake    Reviewed: Allergy & Precautions, NPO status , Patient's Chart, lab work & pertinent test results  Airway Mallampati: II  TM Distance: >3 FB Neck ROM: Full    Dental  (+) Dental Advisory Given, Poor Dentition,    Pulmonary Current Smoker,    breath sounds clear to auscultation       Cardiovascular hypertension,  Rhythm:Regular Rate:Normal     Neuro/Psych    GI/Hepatic   Endo/Other    Renal/GU      Musculoskeletal   Abdominal   Peds  Hematology   Anesthesia Other Findings   Reproductive/Obstetrics                            Anesthesia Physical Anesthesia Plan  ASA: III  Anesthesia Plan: General   Post-op Pain Management:    Induction: Intravenous  Airway Management Planned: LMA  Additional Equipment:   Intra-op Plan:   Post-operative Plan:   Informed Consent: I have reviewed the patients History and Physical, chart, labs and discussed the procedure including the risks, benefits and alternatives for the proposed anesthesia with the patient or authorized representative who has indicated his/her understanding and acceptance.   Dental advisory given  Plan Discussed with: CRNA and Anesthesiologist  Anesthesia Plan Comments: (Cervical cancer Smoker Hypertension  Plan GA with LMA  [facility])       Anesthesia Quick Evaluation

## 2016-06-23 NOTE — Interval H&P Note (Signed)
History and Physical Interval Note:  06/23/2016 7:39 AM  Valerie Kerr  has presented today for surgery, with the diagnosis of CERVICAL CANCER  The various methods of treatment have been discussed with the patient and family. After consideration of risks, benefits and other options for treatment, the patient has consented to  Procedure(s): TANDEM RING INSERTION (N/A) as a surgical intervention .  The patient's history has been reviewed, patient examined, no change in status, stable for surgery.  I have reviewed the patient's chart and labs.  Questions were answered to the patient's satisfaction.     [name]

## 2016-06-23 NOTE — Progress Notes (Signed)
IMMEDIATELY FOLLOWING SURGERY: Do not drive or operate machinery for the first twenty four hours after surgery. Do not make any important decisions for twenty four hours after surgery or while taking narcotic pain medications or sedatives. If you develop intractable nausea and vomiting or a severe headache please notify your doctor immediately.   FOLLOW-UP: You do not need to follow up with anesthesia unless specifically instructed to do so.   WOUND CARE INSTRUCTIONS (if applicable): Expect some mild vaginal bleeding, but if large amount of bleeding occurs please contact Dr. Sondra Come at [phone] or the Radiation On-Call physician. Call for any fever greater than 101.0 degrees or increasing vaginal//abdominal pain or trouble urinating.   QUESTIONS?: Please feel free to call your physician or the hospital operator if you have any questions, and they will be happy to assist you. Resume all medications: as listed on your after visit summary. Your next appointment is:  Future Appointments Date Time Provider [place]  06/29/2016 10:15 AM [facility] LAB 2 [facility] None  06/30/2016 10:15 AM Heath Lark, MD [facility] None  07/04/2016 8:00 AM [facility] 1 [facility] [place]  07/04/2016 11:00 AM [name], MD [facility] None  07/04/2016 1:30 PM [name], MD [facility] None  07/04/2016 3:00 PM [name], MD [facility] None  08/15/2016 3:00 PM [name], MD [facility] None

## 2016-06-23 NOTE — Anesthesia Postprocedure Evaluation (Addendum)
Anesthesia Post Note  Patient: Valerie Kerr  Procedure(s) Performed: Procedure(s) (LRB): TANDEM RING INSERTION (N/A)  Patient location during evaluation: PACU Anesthesia Type: General Level of consciousness: awake, awake and alert and oriented Pain management: pain level controlled Vital Signs Assessment: post-procedure vital signs reviewed and stable Respiratory status: spontaneous breathing, nonlabored ventilation and respiratory function stable Cardiovascular status: blood pressure returned to baseline Postop Assessment: no headache Anesthetic complications: no       Last Vitals:  Vitals:   06/23/16 1000 06/23/16 1011  BP: (!) 147/83   Pulse: 82   Resp: 11 15  Temp: 36.7 C     Last Pain:  Vitals:   06/23/16 0930  TempSrc:   PainSc: Asleep                 Renzo Vincelette COKER

## 2016-06-23 NOTE — Progress Notes (Signed)
  Radiation Oncology         [phone] ________________________________  Name: Valerie Kerr MRN: [id number]  Date: 06/23/2016  DOB: [birth year]  SIMULATION AND TREATMENT PLANNING NOTE HDR BRACHYTHERAPY  DIAGNOSIS:  Stage IIB poorly differentiated squamous cell carcinoma of the cervix  NARRATIVE:  The patient was brought to the [place].  Identity was confirmed.  All relevant records and images related to the planned course of therapy were reviewed.  The patient freely provided informed written consent to proceed with treatment after reviewing the details related to the planned course of therapy. The consent form was witnessed and verified by the simulation staff.  Then, the patient was set-up in a stable reproducible  supine position for radiation therapy.  CT images were obtained.  Surface markings were placed.  The CT images were loaded into the planning software.  Then the target and avoidance structures were contoured.  Treatment planning then occurred.  The radiation prescription was entered and confirmed.   I have requested : Brachytherapy Isodose Plan and Dosimetry Calculations to plan the radiation distribution.    PLAN:  The patient will receive 5.5 Gy in 1 fraction directed at the high-risk CTV. Patient will be treated with iridium 192 as the high-dose-rate source. The patient will be treated with a tandem ring system  -----------------------------------  [name], PhD, MD  This document serves as a record of services personally performed by [name], MD. It was created on his behalf by Maryla Morrow, a trained medical scribe. The creation of this record is based on the scribe's personal observations and the provider's statements to them. This document has been checked and approved by the attending provider.

## 2016-06-24 NOTE — Addendum Note (Signed)
Encounter addended by: Jacqulyn Liner, RN on: 06/24/2016  7:24 AM<BR>    Actions taken: Charge Capture section accepted, Sign clinical note

## 2016-06-24 NOTE — Progress Notes (Signed)
Removed foley catheter intact.  Removed left hand IV intact.  Pressure and band aid applied.  Patient was given discharge instructions and was discharged from the clinic in a wheelchair to meet her daughter in the lobby.

## 2016-06-27 ENCOUNTER — Encounter (HOSPITAL_BASED_OUTPATIENT_CLINIC_OR_DEPARTMENT_OTHER): Payer: Self-pay | Admitting: Radiation Oncology

## 2016-06-29 ENCOUNTER — Other Ambulatory Visit (HOSPITAL_BASED_OUTPATIENT_CLINIC_OR_DEPARTMENT_OTHER): Payer: Self-pay

## 2016-06-29 ENCOUNTER — Encounter (HOSPITAL_BASED_OUTPATIENT_CLINIC_OR_DEPARTMENT_OTHER): Payer: Self-pay | Admitting: *Deleted

## 2016-06-29 ENCOUNTER — Encounter (HOSPITAL_COMMUNITY)
Admission: RE | Admit: 2016-06-29 | Discharge: 2016-06-29 | Disposition: A | Discharge status: Home / Self Care | Payer: Medicaid Other | Source: Ambulatory Visit | Attending: Hematology and Oncology | Admitting: Hematology and Oncology

## 2016-06-29 DIAGNOSIS — C539 Malignant neoplasm of cervix uteri, unspecified: Secondary | ICD-10-CM

## 2016-06-29 LAB — CBC WITH DIFFERENTIAL/PLATELET
BASO%: 0.6 % (ref 0.0–2.0)
BASOS ABS: 0 10*3/uL (ref 0.0–0.1)
EOS%: 3.2 % (ref 0.0–7.0)
Eosinophils Absolute: 0.1 10*3/uL (ref 0.0–0.5)
HCT: 32.3 % — ABNORMAL LOW (ref 34.8–46.6)
HGB: 10.7 g/dL — ABNORMAL LOW (ref 11.6–15.9)
LYMPH%: 19.6 % (ref 14.0–49.7)
MCH: 29.4 pg (ref 25.1–34.0)
MCHC: 33.1 g/dL (ref 31.5–36.0)
MCV: 88.8 fL (ref 79.5–101.0)
MONO#: 0.4 10*3/uL (ref 0.1–0.9)
MONO%: 11.7 % (ref 0.0–14.0)
NEUT#: 2.2 10*3/uL (ref 1.5–6.5)
NEUT%: 64.9 % (ref 38.4–76.8)
Platelets: 220 10*3/uL (ref 145–400)
RBC: 3.64 10*6/uL — AB (ref 3.70–5.45)
RDW: 19.8 % — ABNORMAL HIGH (ref 11.2–14.5)
WBC: 3.3 10*3/uL — ABNORMAL LOW (ref 3.9–10.3)
lymph#: 0.6 10*3/uL — ABNORMAL LOW (ref 0.9–3.3)

## 2016-06-29 LAB — COMPREHENSIVE METABOLIC PANEL
ALT: 12 U/L (ref 0–55)
AST: 18 U/L (ref 5–34)
Albumin: 3.7 g/dL (ref 3.5–5.0)
Alkaline Phosphatase: 76 U/L (ref 40–150)
Anion Gap: 8 mEq/L (ref 3–11)
BUN: 12.9 mg/dL (ref 7.0–26.0)
CHLORIDE: 105 meq/L (ref 98–109)
CO2: 29 meq/L (ref 22–29)
Calcium: 9 mg/dL (ref 8.4–10.4)
Creatinine: 1 mg/dL (ref 0.6–1.1)
EGFR: 76 mL/min/{1.73_m2} — ABNORMAL LOW (ref >90)
GLUCOSE: 80 mg/dL (ref 70–140)
POTASSIUM: 3.7 meq/L (ref 3.5–5.1)
SODIUM: 142 meq/L (ref 136–145)
Total Bilirubin: 0.46 mg/dL (ref 0.20–1.20)
Total Protein: 7.2 g/dL (ref 6.4–8.3)

## 2016-06-29 LAB — GLUCOSE, CAPILLARY: GLUCOSE-CAPILLARY: 87 mg/dL (ref 65–99)

## 2016-06-29 LAB — MAGNESIUM: MAGNESIUM: 1.7 mg/dL (ref 1.5–2.5)

## 2016-06-29 MED ORDER — FLUDEOXYGLUCOSE F - 18 (FDG) INJECTION
7.2700 | Freq: Once | INTRAVENOUS | Status: AC | PRN
  Administered 2016-06-29: 7.27 via INTRAVENOUS

## 2016-06-29 NOTE — Progress Notes (Signed)
NPO AFTER MN.  ARRIVE AT 0715.  CURRENT LAB RESULTS AND EKG IN CHART AND EPIC.  WILL TAKE PRILOSEC, METHADONE, AND XANAX AM DOS W/ SIPS OF WATER.

## 2016-06-30 ENCOUNTER — Encounter: Payer: Self-pay | Admitting: Hematology and Oncology

## 2016-06-30 ENCOUNTER — Other Ambulatory Visit: Payer: Self-pay | Admitting: Radiation Oncology

## 2016-06-30 ENCOUNTER — Ambulatory Visit (HOSPITAL_BASED_OUTPATIENT_CLINIC_OR_DEPARTMENT_OTHER): Payer: Self-pay | Admitting: Hematology and Oncology

## 2016-06-30 DIAGNOSIS — D6481 Anemia due to antineoplastic chemotherapy: Secondary | ICD-10-CM

## 2016-06-30 DIAGNOSIS — Z720 Tobacco use: Secondary | ICD-10-CM

## 2016-06-30 DIAGNOSIS — C539 Malignant neoplasm of cervix uteri, unspecified: Secondary | ICD-10-CM

## 2016-06-30 DIAGNOSIS — T451X5A Adverse effect of antineoplastic and immunosuppressive drugs, initial encounter: Secondary | ICD-10-CM

## 2016-06-30 DIAGNOSIS — R590 Localized enlarged lymph nodes: Secondary | ICD-10-CM

## 2016-06-30 DIAGNOSIS — D701 Agranulocytosis secondary to cancer chemotherapy: Secondary | ICD-10-CM

## 2016-06-30 MED ORDER — HYDROMORPHONE HCL 4 MG PO TABS: 4.0000 mg | ORAL_TABLET | ORAL | 0 refills | Status: DC | PRN

## 2016-06-30 MED FILL — HYDROmorphone HCL 4 MG TABS: 4 | 4 days supply | Qty: 25 | Fill #0

## 2016-06-30 NOTE — Progress Notes (Signed)
[place] OFFICE PROGRESS NOTE  Patient Care Team: [name], MD as PCP - General (Family Medicine)  SUMMARY OF ONCOLOGIC HISTORY:   Cervical cancer ([place])   02/20/2016 Initial Diagnosis    She presented to the ED with vaginal bleeding and abnormal pelvic mass. She was seen in the office by Dr Baron Sane 03/01/16 who performed cervical biopsies of a friable mass which revealed poorly differentiated squamous cell carcinoma.      03/01/2016 Pathology Results    1. Cervix, biopsy, mass INFILTRATIVE SQUAMOUS CELL CARCINOMA 2. Cervix, biopsy INVASIVE POORLY DIFFERENTIATED SQUAMOUS CELL CARCINOMA      03/03/2016 Imaging    Pelvic US was Suboptimal study due to bowel gas. Difficult visualization of the uterus and adnexal structures appear probable multiple fibroids within the uterus. Endometrium thickened at 14 mm. In the setting of post-menopausal bleeding, endometrial sampling is indicated to exclude carcinoma. If results are benign, sonohysterogram should be considered for focal lesion work-up.       03/23/2016 Miscellaneous    Examination by Dr. [name] in the office concluded the cervix is completely replaced by a 6cm exophytic friable mass which is replacing the cervix and encroaching the upper vaginal fornices and the parametrium bilaterally.      04/04/2016 PET scan    5.5 cm hypermetabolic cervical mass, consistent with known primary cervical carcinoma. No definite local or distant metastatic disease. Sub-cm hypermetabolic left axillary lymph nodes, likely reactive in etiology. Recommend continued attention on follow-up imaging.       04/08/2016 Procedure    Dr. Enrique Sack performed Multiple extraction of tooth numbers 3, 9, 21, 30, and 31 .  2 Quadrants of alveoloplasty 3. Gross debridement of remaining dentition         04/12/2016 - 05/09/2016 Chemotherapy    The patient had weekly concurrent chemotherapy with radiation       04/14/2016 -  Radiation Therapy     The patient had concurrent radiation treatment      05/16/2016 Adverse Reaction    Week 6 of chemotherapy is placed on hold due to pancytopenia.      06/29/2016 PET scan    Generally markedly improved in appearance, with resolved masslike appearance of the cervix and dramatic reduction in cervical metabolic activity. Moreover, the left axillary nodes shown on the prior exam are no longer hypermetabolic. 2. However, there is a new small hypermetabolic focus in the right inguinal region corresponding to a suspected lymph node along a vascular confluence, maximum SUV 6.4. This merits surveillance. 3. Other imaging findings of potential clinical significance: Mucous retention cyst in the left maxillary sinus. Aortic atherosclerosis. Nonobstructive left nephrolithiasis. Spondylosis and degenerative disc disease in the lower lumbar spine.       INTERVAL HISTORY: Please see below for problem oriented charting. She returns for further follow-up She has completed chemotherapy She has 1 more appointment for radiation treatment next week She has significant skin rash due to radiation induced skin irritation She was prescribed antibiotic but did not start taking it She continues to smoke She denies recent vaginal bleeding  REVIEW OF SYSTEMS:   Constitutional: Denies fevers, chills or abnormal weight loss Eyes: Denies blurriness of vision Ears, nose, mouth, throat, and face: Denies mucositis or sore throat Respiratory: Denies cough, dyspnea or wheezes Cardiovascular: Denies palpitation, chest discomfort or lower extremity swelling Gastrointestinal:  Denies nausea, heartburn or change in bowel habits Skin: Denies abnormal skin rashes Lymphatics: Denies new lymphadenopathy or easy bruising Neurological:Denies numbness, tingling or  new weaknesses Behavioral/Psych: Mood is stable, no new changes  All other systems were reviewed with the patient and are negative.  I have reviewed the past medical  history, past surgical history, social history and family history with the patient and they are unchanged from previous note.  ALLERGIES:  is allergic to codeine; fentanyl; and tramadol.  MEDICATIONS:  Current Outpatient Prescriptions  Medication Sig Dispense Refill  . acetaminophen (TYLENOL) 500 MG tablet Take 500 mg by mouth every 6 (six) hours as needed for mild pain.     Marland Kitchen ALPRAZolam (XANAX) 0.25 MG tablet Take 1 tablet (0.25 mg total) by mouth 2 (two) times daily as needed for anxiety. 30 tablet 0  . fluticasone (FLONASE) 50 MCG/ACT nasal spray Place 1 spray into both nostrils daily. 16 g 1  . ibuprofen (ADVIL,MOTRIN) 600 MG tablet Take 1 tablet by mouth every 6 hours for 24 hours and then as needed for moderate to severe pain. 30 tablet 0  . lisinopril-hydrochlorothiazide (PRINZIDE) 10-12.5 MG tablet Take 1 tablet by mouth daily. 30 tablet 3  . methadone (DOLOPHINE) 10 MG/5ML solution Take 55 mg by mouth daily.    Marland Kitchen omeprazole (PRILOSEC) 20 MG capsule Take 1 capsule (20 mg total) by mouth daily. 30 capsule 3  . Phenylephrine-APAP-Guaifenesin (SINUS CONGESTION/PAIN SEVERE PO) Take 1 capsule by mouth daily as needed (sinus pain).    . prochlorperazine (COMPAZINE) 10 MG tablet Take 1 tablet (10 mg total) by mouth every 6 (six) hours as needed for nausea. 20 tablet 0  . HYDROmorphone (DILAUDID) 4 MG tablet Take 1 tablet (4 mg total) by mouth every 4 (four) hours as needed for severe pain. 25 tablet 0  . magnesium oxide (MAG-OX) 400 (241.3 Mg) MG tablet Take 1 tablet (400 mg total) by mouth daily. (Patient not taking: Reported on 06/30/2016) 30 tablet 1  . potassium chloride SA (K-DUR,KLOR-CON) 20 MEQ tablet Take 1 tablet (20 mEq total) by mouth 2 (two) times daily. (Patient not taking: Reported on 06/30/2016) 14 tablet 0  . simethicone (GAS-X) 80 MG chewable tablet 1 tablet by mouth every 6 hours as needed for burping or flatulence. (Patient not taking: Reported on 06/30/2016) 120 tablet 3   No  current facility-administered medications for this visit.     PHYSICAL EXAMINATION: ECOG PERFORMANCE STATUS: 1 - Symptomatic but completely ambulatory  Vitals:   06/30/16 1013  BP: (!) 141/71  Pulse: (!) 104  Resp: 18  Temp: 98.4 F (36.9 C)   Filed Weights   06/30/16 1013  Weight: 145 lb 1.6 oz (65.8 kg)    GENERAL:alert, no distress and comfortable SKIN: Noted skin irritation from radiation side effects EYES: normal, Conjunctiva are pink and non-injected, sclera clear Musculoskeletal:no cyanosis of digits and no clubbing  NEURO: alert & oriented x 3 with fluent speech, no focal motor/sensory deficits  LABORATORY DATA:  I have reviewed the data as listed    Component Value Date/Time   NA 142 06/29/2016 0921   K 3.7 06/29/2016 0921   CL 100 (L) 04/08/2016 0630   CO2 29 06/29/2016 0921   GLUCOSE 80 06/29/2016 0921   BUN 12.9 06/29/2016 0921   CREATININE 1.0 06/29/2016 0921   CALCIUM 9.0 06/29/2016 0921   PROT 7.2 06/29/2016 0921   ALBUMIN 3.7 06/29/2016 0921   AST 18 06/29/2016 0921   ALT 12 06/29/2016 0921   ALKPHOS 76 06/29/2016 0921   BILITOT 0.46 06/29/2016 0921   GFRNONAA >60 04/08/2016 0630   GFRAA >60 04/08/2016  0630    No results found for: SPEP, UPEP  Lab Results  Component Value Date   WBC 3.3 (L) 06/29/2016   NEUTROABS 2.2 06/29/2016   HGB 10.7 (L) 06/29/2016   HCT 32.3 (L) 06/29/2016   MCV 88.8 06/29/2016   PLT 220 06/29/2016      Chemistry      Component Value Date/Time   NA 142 06/29/2016 0921   K 3.7 06/29/2016 0921   CL 100 (L) 04/08/2016 0630   CO2 29 06/29/2016 0921   BUN 12.9 06/29/2016 0921   CREATININE 1.0 06/29/2016 0921      Component Value Date/Time   CALCIUM 9.0 06/29/2016 0921   ALKPHOS 76 06/29/2016 0921   AST 18 06/29/2016 0921   ALT 12 06/29/2016 0921   BILITOT 0.46 06/29/2016 0921       RADIOGRAPHIC STUDIES: I reviewed imaging study of the PET/CT with the patient I have personally reviewed the radiological  images as listed and agreed with the findings in the report. [place] Intraoperative  Result Date: 06/23/2016 CLINICAL DATA:  Ultrasound was provided for use by the ordering physician, and a technical charge was applied by the performing facility.  No radiologist interpretation/professional services rendered.   [place] Intraoperative  Result Date: 06/20/2016 CLINICAL DATA:  Ultrasound was provided for use by the ordering physician, and a technical charge was applied by the performing facility.  No radiologist interpretation/professional services rendered.   [place] Intraoperative  Result Date: 06/02/2016 CLINICAL DATA:  Ultrasound was provided for use by the ordering physician, and a technical charge was applied by the performing facility.  No radiologist interpretation/professional services rendered.   Nm Pet Image Restag (ps) Skull Base To Thigh  Result Date: 06/29/2016 CLINICAL DATA:  Subsequent treatment strategy for cervical cancer. EXAM: NUCLEAR MEDICINE PET SKULL BASE TO THIGH TECHNIQUE: 7.3 mCi F-18 FDG was injected intravenously. Full-ring PET imaging was performed from the skull base to thigh after the radiotracer. CT data was obtained and used for attenuation correction and anatomic localization. FASTING BLOOD GLUCOSE:  Value: 87 mg/dl COMPARISON:  Multiple exams, including 04/04/2016 FINDINGS: NECK No hypermetabolic lymph nodes in the neck. Glottic activity is felt to be physiologic. Mucous retention cyst in the left maxillary sinus. CHEST Hypermetabolic activity in left axillary lymph nodes has resolved. Of 1 of these nodes previously measured 7 mm in diameter and currently measures 4 mm in diameter when measured in the same fashion. Mild atherosclerotic calcification of the aortic arch ABDOMEN/PELVIS The cervix no longer appears expanded and has a maximum SUV of approximately 5.6, formerly 21.8. This is overall a dramatic improvement in local disease. However, there is a suspected 7 mm lymph node  (previously 5 mm) at a vascular confluence along the right inguinal chain, maximum SUV 6.4, this hypermetabolic activity is new compared to the prior exam. Aortoiliac atherosclerotic vascular disease. 2 mm left kidney upper pole nonobstructive renal calculus. SKELETON No focal hypermetabolic activity to suggest skeletal metastasis. Spondylosis and degenerative disc disease at L4-5 and L5-S1. IMPRESSION: 1. Generally markedly improved in appearance, with resolved masslike appearance of the cervix and dramatic reduction in cervical metabolic activity. Moreover, the left axillary nodes shown on the prior exam are no longer hypermetabolic. 2. However, there is a new small hypermetabolic focus in the right inguinal region corresponding to a suspected lymph node along a vascular confluence, maximum SUV 6.4. This merits surveillance. 3. Other imaging findings of potential clinical significance: Mucous retention cyst in the left maxillary sinus. Aortic atherosclerosis.  Nonobstructive left nephrolithiasis. Spondylosis and degenerative disc disease in the lower lumbar spine. Electronically Signed   By: Van Clines M.D.   On: 06/29/2016 09:30    ASSESSMENT & PLAN:  Cervical cancer ([place]) I reviewed the imaging study with the patient She have near complete resolution of  her cancer The small lymphadenopathy noted in the inguinal region is likely reactive to skin infection She will complete radiation therapy next week She has appointment to see GYN oncologist next month I reviewed with her current guidelines She would need close follow-up with GYN oncologist in the future  Leukopenia due to antineoplastic chemotherapy [facility]) This is due to recent treatment She is not symptomatic It should resolve after stopping treatment   Anemia due to antineoplastic chemotherapy This is likely anemia of chronic disease. The patient denies recent history of bleeding such as epistaxis, hematuria or hematochezia. She is  asymptomatic from the anemia. We will observe for now.    Inguinal lymphadenopathy This is likely reactive to skin infection I recommend she takes antibiotics as prescribed  Continuous tobacco abuse I spent some time counseling the patient the importance of tobacco cessation. We discussed common strategies including nicotine patches, Tobacco Quit-line, and other nicotine replacement products to assist in hereffort to quit  she appears motivated to quit.    No orders of the defined types were placed in this encounter.  All questions were answered. The patient knows to call the clinic with any problems, questions or concerns. No barriers to learning was detected. I spent 20 minutes counseling the patient face to face. The total time spent in the appointment was 25 minutes and more than 50% was on counseling and review of test results     Heath Lark, MD 06/30/2016 1:20 PM

## 2016-06-30 NOTE — Assessment & Plan Note (Signed)
This is due to recent treatment She is not symptomatic It should resolve after stopping treatment

## 2016-06-30 NOTE — Assessment & Plan Note (Signed)
This is likely anemia of chronic disease. The patient denies recent history of bleeding such as epistaxis, hematuria or hematochezia. She is asymptomatic from the anemia. We will observe for now.  

## 2016-06-30 NOTE — Assessment & Plan Note (Signed)
This is likely reactive to skin infection I recommend she takes antibiotics as prescribed

## 2016-06-30 NOTE — Assessment & Plan Note (Signed)
I spent some time counseling the patient the importance of tobacco cessation. We discussed common strategies including nicotine patches, Tobacco Quit-line, and other nicotine replacement products to assist in hereffort to quit  she appears motivated to quit.  

## 2016-06-30 NOTE — Assessment & Plan Note (Signed)
I reviewed the imaging study with the patient She have near complete resolution of  her cancer The small lymphadenopathy noted in the inguinal region is likely reactive to skin infection She will complete radiation therapy next week She has appointment to see GYN oncologist next month I reviewed with her current guidelines She would need close follow-up with GYN oncologist in the future

## 2016-07-04 ENCOUNTER — Ambulatory Visit
Admission: RE | Admit: 2016-07-04 | Discharge: 2016-07-04 | Disposition: A | Discharge status: Home / Self Care | Payer: Self-pay | Source: Ambulatory Visit | Attending: Radiation Oncology | Admitting: Radiation Oncology

## 2016-07-04 ENCOUNTER — Ambulatory Visit (HOSPITAL_BASED_OUTPATIENT_CLINIC_OR_DEPARTMENT_OTHER): Payer: Self-pay | Admitting: Anesthesiology

## 2016-07-04 ENCOUNTER — Encounter (HOSPITAL_BASED_OUTPATIENT_CLINIC_OR_DEPARTMENT_OTHER): Payer: Self-pay | Admitting: Anesthesiology

## 2016-07-04 ENCOUNTER — Ambulatory Visit (HOSPITAL_COMMUNITY)
Admission: RE | Admit: 2016-07-04 | Discharge: 2016-07-04 | Disposition: A | Discharge status: Home / Self Care | Payer: Self-pay | Source: Ambulatory Visit | Attending: Radiation Oncology | Admitting: Radiation Oncology

## 2016-07-04 ENCOUNTER — Ambulatory Visit
Admission: RE | Admit: 2016-07-04 | Discharge: 2016-07-04 | Disposition: A | Discharge status: Home / Self Care | Payer: Medicaid Other | Source: Ambulatory Visit | Attending: Radiation Oncology | Admitting: Radiation Oncology

## 2016-07-04 ENCOUNTER — Other Ambulatory Visit: Payer: Self-pay | Admitting: *Deleted

## 2016-07-04 ENCOUNTER — Ambulatory Visit (HOSPITAL_BASED_OUTPATIENT_CLINIC_OR_DEPARTMENT_OTHER)
Admission: RE | Admit: 2016-07-04 | Discharge: 2016-07-04 | Disposition: A | Discharge status: Home / Self Care | Payer: Self-pay | Source: Ambulatory Visit | Attending: Radiation Oncology | Admitting: Radiation Oncology

## 2016-07-04 ENCOUNTER — Ambulatory Visit: Payer: Self-pay | Admitting: Family Medicine

## 2016-07-04 ENCOUNTER — Ambulatory Visit: Payer: Medicaid Other | Admitting: Radiation Oncology

## 2016-07-04 ENCOUNTER — Encounter (HOSPITAL_BASED_OUTPATIENT_CLINIC_OR_DEPARTMENT_OTHER)
Admission: RE | Disposition: A | Discharge status: Home / Self Care | Payer: Self-pay | Source: Ambulatory Visit | Attending: Radiation Oncology

## 2016-07-04 VITALS — BP 132/75 | HR 82

## 2016-07-04 DIAGNOSIS — F418 Other specified anxiety disorders: Secondary | ICD-10-CM | POA: Insufficient documentation

## 2016-07-04 DIAGNOSIS — C539 Malignant neoplasm of cervix uteri, unspecified: Secondary | ICD-10-CM | POA: Insufficient documentation

## 2016-07-04 DIAGNOSIS — Z510 Encounter for antineoplastic radiation therapy: Secondary | ICD-10-CM | POA: Insufficient documentation

## 2016-07-04 DIAGNOSIS — Y842 Radiological procedure and radiotherapy as the cause of abnormal reaction of the patient, or of later complication, without mention of misadventure at the time of the procedure: Secondary | ICD-10-CM | POA: Insufficient documentation

## 2016-07-04 DIAGNOSIS — C530 Malignant neoplasm of endocervix: Secondary | ICD-10-CM

## 2016-07-04 DIAGNOSIS — F419 Anxiety disorder, unspecified: Secondary | ICD-10-CM | POA: Insufficient documentation

## 2016-07-04 DIAGNOSIS — I10 Essential (primary) hypertension: Secondary | ICD-10-CM | POA: Insufficient documentation

## 2016-07-04 DIAGNOSIS — K219 Gastro-esophageal reflux disease without esophagitis: Secondary | ICD-10-CM | POA: Insufficient documentation

## 2016-07-04 HISTORY — PX: TANDEM RING INSERTION: SHX6199

## 2016-07-04 SURGERY — INSERTION, UTERINE TANDEM AND RING OR CYLINDER, FOR BRACHYTHERAPY
Anesthesia: General | Site: Vagina

## 2016-07-04 MED ORDER — MIDAZOLAM HCL 5 MG/5ML IJ SOLN
INTRAMUSCULAR | Status: DC | PRN
  Administered 2016-07-04: 2 mg via INTRAVENOUS

## 2016-07-04 MED ORDER — HYDROMORPHONE HCL 1 MG/ML IJ SOLN
INTRAMUSCULAR | Status: AC
  Filled 2016-07-04: qty 1

## 2016-07-04 MED ORDER — MIDAZOLAM HCL 2 MG/2ML IJ SOLN
INTRAMUSCULAR | Status: AC
  Filled 2016-07-04: qty 2

## 2016-07-04 MED ORDER — HYDROMORPHONE HCL 1 MG/ML IJ SOLN
0.2500 mg | INTRAMUSCULAR | Status: DC | PRN
  Administered 2016-07-04: 0.5 mg via INTRAVENOUS
  Filled 2016-07-04: qty 0.5

## 2016-07-04 MED ORDER — HYDROMORPHONE HCL 1 MG/ML IJ SOLN
INTRAMUSCULAR | Status: DC | PRN
  Administered 2016-07-04 (×4): 0.5 mg via INTRAVENOUS

## 2016-07-04 MED ORDER — HYDROMORPHONE HCL 4 MG/ML IJ SOLN
1.0000 mg | Freq: Once | INTRAMUSCULAR | Status: DC
Start: 2016-07-04 — End: 2016-07-05
  Filled 2016-07-04: qty 1

## 2016-07-04 MED ORDER — PROPOFOL 10 MG/ML IV BOLUS
INTRAVENOUS | Status: AC
  Filled 2016-07-04: qty 40

## 2016-07-04 MED ORDER — FENTANYL CITRATE (PF) 100 MCG/2ML IJ SOLN
INTRAMUSCULAR | Status: AC
  Filled 2016-07-04: qty 2

## 2016-07-04 MED ORDER — PROPOFOL 10 MG/ML IV BOLUS
INTRAVENOUS | Status: DC | PRN
  Administered 2016-07-04: 150 mg via INTRAVENOUS

## 2016-07-04 MED ORDER — MEPERIDINE HCL 25 MG/ML IJ SOLN
6.2500 mg | INTRAMUSCULAR | Status: DC | PRN
  Filled 2016-07-04: qty 1

## 2016-07-04 MED ORDER — SODIUM CHLORIDE 0.9 % IR SOLN
Status: DC | PRN
  Administered 2016-07-04: 90 mL

## 2016-07-04 MED ORDER — DEXAMETHASONE SODIUM PHOSPHATE 10 MG/ML IJ SOLN
INTRAMUSCULAR | Status: DC | PRN
  Administered 2016-07-04: 10 mg via INTRAVENOUS

## 2016-07-04 MED ORDER — KETOROLAC TROMETHAMINE 30 MG/ML IJ SOLN
30.0000 mg | Freq: Once | INTRAMUSCULAR | Status: DC | PRN
  Administered 2016-07-04: 30 mg via INTRAVENOUS
  Filled 2016-07-04: qty 1

## 2016-07-04 MED ORDER — IOHEXOL 300 MG/ML  SOLN
INTRAMUSCULAR | Status: DC | PRN
  Administered 2016-07-04: 10 mL

## 2016-07-04 MED ORDER — ONDANSETRON HCL 4 MG/2ML IJ SOLN
INTRAMUSCULAR | Status: AC
  Filled 2016-07-04: qty 2

## 2016-07-04 MED ORDER — HYDROMORPHONE HCL 1 MG/ML IJ SOLN
1.0000 mg | Freq: Once | INTRAMUSCULAR | Status: AC
  Administered 2016-07-04: 1 mg via INTRAVENOUS
  Filled 2016-07-04: qty 1

## 2016-07-04 MED ORDER — HYDROMORPHONE HCL 2 MG/ML IJ SOLN
INTRAMUSCULAR | Status: AC
Start: 2016-07-04 — End: 2016-07-04
  Filled 2016-07-04: qty 1

## 2016-07-04 MED ORDER — KETOROLAC TROMETHAMINE 30 MG/ML IJ SOLN
INTRAMUSCULAR | Status: AC
  Filled 2016-07-04: qty 1

## 2016-07-04 MED ORDER — DEXAMETHASONE SODIUM PHOSPHATE 10 MG/ML IJ SOLN
INTRAMUSCULAR | Status: AC
Start: 2016-07-04 — End: 2016-07-04
  Filled 2016-07-04: qty 1

## 2016-07-04 MED ORDER — ONDANSETRON HCL 4 MG/2ML IJ SOLN
INTRAMUSCULAR | Status: DC | PRN
Start: 2016-07-04 — End: 2016-07-04
  Administered 2016-07-04: 4 mg via INTRAVENOUS

## 2016-07-04 MED ORDER — HYDROMORPHONE HCL 1 MG/ML IJ SOLN
INTRAMUSCULAR | Status: AC
  Filled 2016-07-04: qty 0.5

## 2016-07-04 MED ORDER — WATER FOR IRRIGATION, STERILE IR SOLN
Status: DC | PRN
  Administered 2016-07-04: 3000 mL via INTRAVESICAL

## 2016-07-04 MED ORDER — LIDOCAINE 2% (20 MG/ML) 5 ML SYRINGE
INTRAMUSCULAR | Status: DC | PRN
  Administered 2016-07-04: 60 mg via INTRAVENOUS

## 2016-07-04 MED ORDER — LACTATED RINGERS IV SOLN
INTRAVENOUS | Status: DC
  Administered 2016-07-04 (×2): via INTRAVENOUS
  Filled 2016-07-04 (×2): qty 1000

## 2016-07-04 MED ORDER — PROMETHAZINE HCL 25 MG/ML IJ SOLN
6.2500 mg | INTRAMUSCULAR | Status: DC | PRN
  Filled 2016-07-04: qty 1

## 2016-07-04 SURGICAL SUPPLY — 28 items
BAG URINE DRAINAGE (UROLOGICAL SUPPLIES) ×3 IMPLANT
BNDG CONFORM 2 STRL LF (GAUZE/BANDAGES/DRESSINGS) IMPLANT
CATH FOLEY 2WAY SLVR  5CC 16FR (CATHETERS) ×2
CATH FOLEY 2WAY SLVR 5CC 16FR (CATHETERS) ×1 IMPLANT
COVER BACK TABLE 60X90IN (DRAPES) ×3 IMPLANT
DRAPE LG THREE QUARTER DISP (DRAPES) ×3 IMPLANT
DRAPE UNDERBUTTOCKS STRL (DRAPE) ×3 IMPLANT
DRSG PAD ABDOMINAL 8X10 ST (GAUZE/BANDAGES/DRESSINGS) ×2 IMPLANT
DRSG TEGADERM 2-3/8X2-3/4 SM (GAUZE/BANDAGES/DRESSINGS) ×4 IMPLANT
GLOVE BIO SURGEON STRL SZ7.5 (GLOVE) ×6 IMPLANT
GOWN STRL REUS W/ TWL LRG LVL3 (GOWN DISPOSABLE) ×2 IMPLANT
GOWN STRL REUS W/TWL LRG LVL3 (GOWN DISPOSABLE) ×6
HOLDER FOLEY CATH W/STRAP (MISCELLANEOUS) ×3 IMPLANT
KIT RM TURNOVER CYSTO AR (KITS) ×3 IMPLANT
LEGGING LITHOTOMY PAIR STRL (DRAPES) ×3 IMPLANT
NS IRRIG 500ML POUR BTL (IV SOLUTION) ×2 IMPLANT
PACK BASIN DAY SURGERY FS (CUSTOM PROCEDURE TRAY) ×3 IMPLANT
PACKING VAGINAL (PACKING) IMPLANT
PAD ABD 8X10 STRL (GAUZE/BANDAGES/DRESSINGS) ×3 IMPLANT
PAD PREP 24X48 CUFFED NSTRL (MISCELLANEOUS) ×3 IMPLANT
PLUG CATH AND CAP STER (CATHETERS) IMPLANT
SET IRRIG Y TYPE TUR BLADDER L (SET/KITS/TRAYS/PACK) ×3 IMPLANT
SYRINGE 10CC LL (SYRINGE) ×3 IMPLANT
TOWEL OR 17X24 6PK STRL BLUE (TOWEL DISPOSABLE) ×6 IMPLANT
TRAY DSU PREP LF (CUSTOM PROCEDURE TRAY) ×3 IMPLANT
VAGINAL BALLOON PACKING SYSTEM ×2 IMPLANT
WATER STERILE IRR 3000ML UROMA (IV SOLUTION) ×3 IMPLANT
WATER STERILE IRR 500ML POUR (IV SOLUTION) ×3 IMPLANT

## 2016-07-04 NOTE — Progress Notes (Signed)
IMMEDIATELY FOLLOWING SURGERY: Do not drive or operate machinery for the first twenty four hours after surgery. Do not make any important decisions for twenty four hours after surgery or while taking narcotic pain medications or sedatives. If you develop intractable nausea and vomiting or a severe headache please notify your doctor immediately.   FOLLOW-UP: You do not need to follow up with anesthesia unless specifically instructed to do so.   WOUND CARE INSTRUCTIONS (if applicable): Expect some mild vaginal bleeding, but if large amount of bleeding occurs please contact Dr. Sondra Come at [phone] or the Radiation On-Call physician. Call for any fever greater than 101.0 degrees or increasing vaginal//abdominal pain or trouble urinating.   QUESTIONS?: Please feel free to call your physician or the hospital operator if you have any questions, and they will be happy to assist you. Resume all medications: as listed on your after visit summary. Your next appointment is:  Future Appointments Date Time Provider [place]  08/15/2016 3:00 PM [name], MD [facility] None  08/18/2016 8:30 AM [name], MD [facility] None

## 2016-07-04 NOTE — Progress Notes (Signed)
IMMEDIATELY FOLLOWING SURGERY: Do not drive or operate machinery for the first twenty four hours after surgery. Do not make any important decisions for twenty four hours after surgery or while taking narcotic pain medications or sedatives. If you develop intractable nausea and vomiting or a severe headache please notify your doctor immediately.   FOLLOW-UP: You do not need to follow up with anesthesia unless specifically instructed to do so.   WOUND CARE INSTRUCTIONS (if applicable): Expect some mild vaginal bleeding, but if large amount of bleeding occurs please contact Dr. Sondra Come at [phone] or the Radiation On-Call physician. Call for any fever greater than 101.0 degrees or increasing vaginal//abdominal pain or trouble urinating.   QUESTIONS?: Please feel free to call your physician or the hospital operator if you have any questions, and they will be happy to assist you. Resume all medications: as listed on your after visit summary. Your next appointment is:  Future Appointments Date Time Provider [place]  08/15/2016 3:00 PM [name], MD [facility] None  08/18/2016 8:30 AM [name], MD [facility] None

## 2016-07-04 NOTE — Interval H&P Note (Signed)
History and Physical Interval Note:  07/04/2016 9:04 AM  Valerie Kerr  has presented today for surgery, with the diagnosis of CERVICAL CANCER  The various methods of treatment have been discussed with the patient and family. After consideration of risks, benefits and other options for treatment, the patient has consented to  Procedure(s): TANDEM RING INSERTION (N/A) as a surgical intervention .  The patient's history has been reviewed, patient examined, no change in status, stable for surgery.  I have reviewed the patient's chart and labs.  Questions were answered to the patient's satisfaction.     [name]

## 2016-07-04 NOTE — H&P (View-Only) (Signed)
  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  Identity was confirmed.  All relevant records and images related to the planned course of therapy were reviewed.  The patient freely provided informed written consent to proceed with treatment after reviewing the details related to the planned course of therapy. The consent form was witnessed and verified by the simulation staff.  Then, the patient was set-up in a stable reproducible  supine position for radiation therapy.  CT images were obtained.  Surface markings were placed.  The CT images were loaded into the planning software.  Then the target and avoidance structures were contoured.  Treatment planning then occurred.  The radiation prescription was entered and confirmed.   I have requested : Brachytherapy Isodose Plan and Dosimetry Calculations to plan the radiation distribution.    PLAN:  The patient will receive 5.5 Gy in 1 fraction directed at the high-risk CTV. Patient will be treated with iridium 192 as the high-dose-rate source. The patient will be treated with a tandem ring system  -----------------------------------  [name], PhD, MD  This document serves as a record of services personally performed by [name], MD. It was created on his behalf by Maryla Morrow, a trained medical scribe. The creation of this record is based on the scribe's personal observations and the provider's statements to them. This document has been checked and approved by the attending provider.

## 2016-07-04 NOTE — Anesthesia Procedure Notes (Signed)
Procedure Name: LMA Insertion Date/Time: 07/04/2016 9:07 AM Performed by: Wanita Chamberlain Pre-anesthesia Checklist: Patient identified, Emergency Drugs available, Suction available and Patient being monitored Patient Re-evaluated:Patient Re-evaluated prior to inductionOxygen Delivery Method: Circle system utilized Preoxygenation: Pre-oxygenation with 100% oxygen Intubation Type: IV induction Ventilation: Mask ventilation without difficulty LMA: LMA inserted LMA Size: 4.0 Number of attempts: 1 Placement Confirmation: breath sounds checked- equal and bilateral Tube secured with: Tape Dental Injury: Teeth and Oropharynx as per pre-operative assessment

## 2016-07-04 NOTE — Op Note (Signed)
07/04/2016  7:16 PM  PATIENT:  Valerie Kerr  56 y.o. female  PRE-OPERATIVE DIAGNOSIS:  CERVICAL CANCER  POST-OPERATIVE DIAGNOSIS:  CERVICAL CANCER  PROCEDURE:  Procedure(s): TANDEM RING INSERTION (N/A)  SURGEON:  Surgeon(s) and Role:    * [name], MD - Primary  PHYSICIAN ASSISTANT:   ASSISTANTS: none   ANESTHESIA:   general  EBL:  Total I/O In: 1300 [I.V.:1300] Out: 800 [Urine:800]  BLOOD ADMINISTERED:none  DRAINS: Urinary Catheter (Foley)   LOCAL MEDICATIONS USED:  NONE  SPECIMEN:  No Specimen  DISPOSITION OF SPECIMEN:  N/A  COUNTS:  YES  TOURNIQUET:  * No tourniquets in log *  DICTATION: The patient was taken to the outpatient OR #4. Timeout was performed for the procedure, length of anesthesia, preoperative antibiotics.She was prepped and draped in the usual sterile fashion and placed in the dorsal lithotomy position. Exam under anesthesia revealed excellent response to her external beam radiation therapy and radiosensitizing chemotherapy. The cervical mass was estimated to be approximately 2.0x 2.2cm in size. There was no parametrial extension noted on exam. The cervical mass continued to protrude into the upper vaginal fault was not a attachedto the upper vaginal mucosa. The bladder was filled with approximately 200 mL of sterile water to aid in ultrasound imaging purposes .Patient proceeded to undergo dilation and sounding. Theuterus was easily seen on ultrasound and was noted to be an anteverted . The uterus sounded to approximately 8-10cm. The patient then had placement of a 45 60 mm tandem within the uterine canal. Intraoperative ultrasound images verified good placement. Marland Kitchen thepatient then had placement of a 45 ring with small shielding cap attached to the tandem. Patient also had placement of the Alatus vaginal balloon packing system with 2 separate balloons 1 field anteriorly to push the bladder out of the high-dose radiation field and the second  one placed posteriorly to push the rectum out of the high-dose radiation field. Both balloons were instilled with approximately 30 mL of 3% sterile water with contrast. Intraoperative ultrasound at the end of the procedure verified accurate placement of the equipment.The tandem was noted to be in the endometrial canal.Patient tolerated the procedure well. The patient was transported to recovery room in stable condition. Later in the day the patient will be transported to radiation oncology for planning and herfifthhigh-dose-rate intracavitary brachytherapy treatment with iridium 192 as the high-dose-rate source.  PLAN OF CARE: Transferred to radiation oncology for planning and treatment  PATIENT DISPOSITION:  PACU - hemodynamically stable.   Delay start of Pharmacological VTE agent (>24hrs) due to surgical blood loss or risk of bleeding: not applicable

## 2016-07-04 NOTE — Progress Notes (Signed)
  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  Identity was confirmed.  All relevant records and images related to the planned course of therapy were reviewed.  The patient freely provided informed written consent to proceed with treatment after reviewing the details related to the planned course of therapy. The consent form was witnessed and verified by the simulation staff.  Then, the patient was set-up in a stable reproducible  supine position for radiation therapy.  CT images were obtained.  Surface markings were placed.  The CT images were loaded into the planning software.  Then the target and avoidance structures were contoured.  Treatment planning then occurred.  The radiation prescription was entered and confirmed.   I have requested : Brachytherapy Isodose Plan and Dosimetry Calculations to plan the radiation distribution.    PLAN:  The patient will receive 6 Gy in 1 fraction directed at the high-risk CTV. A higher dosage will ve given (instead of 5.5 Gy) due to multiple delays in her treatment. Patient will be treated with iridium 192 as the high-dose-rate source. The patient will be treated with a tandem ring system  -----------------------------------  [name], PhD, MD  This document serves as a record of services personally performed by [name], MD. It was created on his behalf by [name], a trained medical scribe. The creation of this record is based on the scribe's personal observations and the provider's statements to them. This document has been checked and approved by the attending provider.

## 2016-07-04 NOTE — Transfer of Care (Signed)
Immediate Anesthesia Transfer of Care Note  Patient: Valerie Kerr  Procedure(s) Performed: Procedure(s): TANDEM RING INSERTION (N/A)  Patient Location: PACU  Anesthesia Type:General  Level of Consciousness: awake, alert , oriented and patient cooperative  Airway & Oxygen Therapy: Patient Spontanous Breathing and Patient connected to nasal cannula oxygen  Post-op Assessment: Report given to RN and Post -op Vital signs reviewed and stable  Post vital signs: Reviewed and stable  Last Vitals:  Vitals:   07/04/16 0718  BP: 140/68  Resp: 18  Temp: 37 C    Last Pain:  Vitals:   07/04/16 0718  TempSrc: Oral  PainSc:       Patients Stated Pain Goal: 6 (91/36/85 [id number])  Complications: No apparent anesthesia complications

## 2016-07-04 NOTE — Anesthesia Postprocedure Evaluation (Addendum)
Anesthesia Post Note  Patient: Valerie Kerr  Procedure(s) Performed: Procedure(s) (LRB): TANDEM RING INSERTION (N/A)  Patient location during evaluation: PACU Anesthesia Type: General Level of consciousness: awake and sedated Pain management: pain level controlled Vital Signs Assessment: post-procedure vital signs reviewed and stable Respiratory status: spontaneous breathing Cardiovascular status: stable Postop Assessment: no signs of nausea or vomiting Anesthetic complications: no        Last Vitals:  Vitals:   07/04/16 1100 07/04/16 1115  BP: (!) 148/80 (!) 141/83  Pulse: 72 72  Resp: 14 16  Temp:      Last Pain:  Vitals:   07/04/16 1050  TempSrc:   PainSc: 2    Pain Goal: Patients Stated Pain Goal: 6 (07/04/16 0749)               Alverto Shedd JR,JOHN Mateo Flow

## 2016-07-04 NOTE — Anesthesia Preprocedure Evaluation (Addendum)
Anesthesia Evaluation  Patient identified by MRN, date of birth, ID band Patient awake    Reviewed: Allergy & Precautions, NPO status , Patient's Chart, lab work & pertinent test results  Airway Mallampati: II  TM Distance: >3 FB Neck ROM: Full    Dental  (+) Dental Advisory Given, Poor Dentition,    Pulmonary Current Smoker,    Pulmonary exam normal breath sounds clear to auscultation       Cardiovascular hypertension, Normal cardiovascular exam Rhythm:Regular Rate:Normal     Neuro/Psych  Headaches, Anxiety    GI/Hepatic GERD  Medicated,(+)     substance abuse  ,   Endo/Other  negative endocrine ROS  Renal/GU      Musculoskeletal   Abdominal Normal abdominal exam  (+)   Peds  Hematology  (+) anemia ,   Anesthesia Other Findings   Reproductive/Obstetrics                            Anesthesia Physical  Anesthesia Plan  ASA: II  Anesthesia Plan: General   Post-op Pain Management:    Induction: Intravenous  Airway Management Planned: LMA  Additional Equipment:   Intra-op Plan:   Post-operative Plan:   Informed Consent: I have reviewed the patients History and Physical, chart, labs and discussed the procedure including the risks, benefits and alternatives for the proposed anesthesia with the patient or authorized representative who has indicated his/her understanding and acceptance.     Plan Discussed with: CRNA and Surgeon  Anesthesia Plan Comments:         Anesthesia Quick Evaluation

## 2016-07-04 NOTE — Progress Notes (Signed)
  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 Identity was confirmed. All relevant records and images related to the planned course of therapy were reviewed. The patient freely provided informed written consent to proceed with treatment after reviewing the details related to the planned course of therapy. The consent form was witnessed and verified by the simulation staff. Then, the patient was set-up in a stable reproducible supine position for radiation therapy. The tandem ring system was accessed and fiducial markers were placed within the tandem and ring.   Simple treatment device note: On the operating room the patient had construction of her custom tandem ring system. She will be treated with a 45 tandem/ring system. The patient had placement of a 60 mm tandem. A cervical ring with a small shielding was used for her treatment. The Alatus vaginal balloon packing system was used to reduce dose to the bladder and rectum.  Verification simulation note: An AP and lateral film was obtained through the pelvis area. This was compared to the patient's planning films documenting accurate position of the tandem/ring system for treatment.  High-dose-rate brachytherapy treatment note:  The remote afterloading device was accessed through catheter system and attached to the tandem ring system. Patient then proceeded to undergo her fifth high-dose-rate treatment directed at the cervix. The patient was prescribed a dose of 6 gray to be delivered to the high risk CTV. Patient was treated with 2 channels using multiple dwell positions. Treatment time was 698 seconds. The patient tolerated the procedure well.  After completion of her therapy, a radiation survey was performed documenting return of the iridium source into the GammaMed safe. The patient was then transferred to the nursing suite. She then had removal of the rectal paddle followed by the tandem and ring system. The patient tolerated the removal well.  PLAN: The patient was given 6 gray during this treatment due to multiple delays in her treatment. The patient completed HDR brachytherapy. She will follow up with Dr. [name] on 08/15/16 and with radiation oncology on 08/18/16. -----------------------------------  [name], PhD, MD  This document serves as a record of services personally performed by [name], MD. It was created on his behalf by [name], a trained medical scribe. The creation of this record is based on the scribe's personal observations and the provider's statements to them. This document has been checked and approved by the attending provider.

## 2016-07-05 ENCOUNTER — Encounter (HOSPITAL_BASED_OUTPATIENT_CLINIC_OR_DEPARTMENT_OTHER): Payer: Self-pay | Admitting: Radiation Oncology

## 2016-07-06 ENCOUNTER — Encounter: Payer: Self-pay | Admitting: Radiation Oncology

## 2016-07-06 NOTE — Progress Notes (Signed)
  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  During treatment, the patient reported intermittent abdominal pain and fatigue. Patient had delay in completing her brachytherapy treatments secondary to a death in the family requiring 2 trips to [place].   Plan: The patient has completed radiation treatment. The patient will return to radiation oncology clinic for routine followup in one month. I advised them to call or return sooner if they have any questions or concerns related to their recovery or treatment.  -----------------------------------  [name], PhD, MD  This document serves as a record of services personally performed by [name], MD. It was created on his behalf by Bethann Humble, a trained medical scribe. The creation of this record is based on the scribe's personal observations and the provider's statements to them. This document has been checked and approved by the attending provider.

## 2016-07-08 ENCOUNTER — Encounter: Payer: Self-pay | Admitting: Oncology

## 2016-07-08 ENCOUNTER — Telehealth: Payer: Self-pay | Admitting: Oncology

## 2016-07-08 NOTE — Telephone Encounter (Signed)
Patient called and asked that Dr. Sondra Come write her a letter saying that her daughter, Valerie Kerr, is her primary caregiver because of her cancer treatment for section 8 housing requirements.

## 2016-07-11 NOTE — Telephone Encounter (Signed)
Left a message for patient advising her that the letter she requested is ready to be picked up in the Radiation Oncology nursing area.

## 2016-07-13 ENCOUNTER — Telehealth: Payer: Self-pay | Admitting: Oncology

## 2016-07-13 ENCOUNTER — Encounter: Payer: Self-pay | Admitting: Radiation Oncology

## 2016-07-13 ENCOUNTER — Ambulatory Visit
Admission: RE | Admit: 2016-07-13 | Discharge: 2016-07-13 | Disposition: A | Discharge status: Home / Self Care | Payer: Self-pay | Source: Ambulatory Visit | Attending: Radiation Oncology | Admitting: Radiation Oncology

## 2016-07-13 VITALS — BP 130/84 | HR 76 | Temp 97.8°F | Ht 61.0 in | Wt 148.8 lb

## 2016-07-13 DIAGNOSIS — F418 Other specified anxiety disorders: Secondary | ICD-10-CM

## 2016-07-13 DIAGNOSIS — C530 Malignant neoplasm of endocervix: Secondary | ICD-10-CM

## 2016-07-13 MED ORDER — HYDROMORPHONE HCL 4 MG PO TABS: 4.0000 mg | ORAL_TABLET | ORAL | 0 refills | Status: DC | PRN

## 2016-07-13 MED ORDER — FLUTICASONE PROPIONATE 50 MCG/ACT NA SUSP: 1.0000 | Freq: Every day | NASAL | 1 refills | Status: DC

## 2016-07-13 MED ORDER — FLUCONAZOLE 100 MG PO TABS: 100.0000 mg | ORAL_TABLET | Freq: Every day | ORAL | 0 refills | Status: DC

## 2016-07-13 MED ORDER — ALPRAZOLAM 0.25 MG PO TABS: 0.2500 mg | ORAL_TABLET | Freq: Two times a day (BID) | ORAL | 0 refills | Status: DC | PRN

## 2016-07-13 MED FILL — HYDROmorphone HCL 4 MG TABS: 4 | 4 days supply | Qty: 25 | Fill #0

## 2016-07-13 MED FILL — FLUCONAZOLE 100 MG TABLET: 100 | 7 days supply | Qty: 7 | Fill #0

## 2016-07-13 MED FILL — ALPRAZolam 0.25 MG TABS: 0.25 | 15 days supply | Qty: 30 | Fill #0

## 2016-07-13 MED FILL — FLUTICASONE PROP 50 MCG SPR: 50 | 30 days supply | Qty: 16 | Fill #0

## 2016-07-13 NOTE — Progress Notes (Addendum)
Valerie Kerr is here for follow up.  She reports having pain on and off in her lower abdomen.  She takes dilaudid 1-2 times a day as needed and ibuprofen as needed.  She reports having itching in her groin, vaginal and rectal area that started after her last tandem and ring treatment.  She said she took cipro to see if it would stop the itching but it actually made it worse.  She reports feeling fatigue.  She denies having any urinary issues.  She reports having constipation with her last bowel movement today. She reports having a yellow vaginal discharge.  She also has some bruising present on her right hand from a previous IV.  BP 130/84 (BP Location: Left Arm, Patient Position: Sitting)   Pulse 76   Temp 97.8 F (36.6 C) (Oral)   SpO2 99%    Wt Readings from Last 3 Encounters:  07/13/16 148 lb 12.8 oz (67.5 kg)  07/04/16 144 lb (65.3 kg)  06/30/16 145 lb 1.6 oz (65.8 kg)

## 2016-07-13 NOTE — Progress Notes (Signed)
Radiation Oncology         [phone] ________________________________  Name: Valerie Kerr MRN: [id number]  Date: 07/13/2016  DOB: 1960-09-21  Follow-Up Visit Note  CC: [name], MD  [name], MD    ICD-9-CM ICD-10-CM   1. Malignant neoplasm of endocervix ([place]) 180.0 C53.0   2. 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 She reports occasional pain in her lower abdomen that she manages with Dilaudid 1-2 times a day and Ibuprofen prn. She reports itching to the groin, vaginal and rectal area. She reports fatigue. She reports constipation with her most recent bowel movement. She reports a yellow/white vaginal discharge. She denies urinary issues.                            ALLERGIES:  is allergic to codeine; fentanyl; and tramadol.  Meds: Current Outpatient Prescriptions  Medication Sig Dispense Refill  . ALPRAZolam (XANAX) 0.25 MG tablet Take 1 tablet (0.25 mg total) by mouth 2 (two) times daily as needed for anxiety. 30 tablet 0  . fluticasone (FLONASE) 50 MCG/ACT nasal spray Place 1 spray into both nostrils daily. 16 g 1  . HYDROmorphone (DILAUDID) 4 MG tablet Take 1 tablet (4 mg total) by mouth every 4 (four) hours as needed for severe pain. 25 tablet 0  . ibuprofen (ADVIL,MOTRIN) 600 MG tablet Take 1 tablet by mouth every 6 hours for 24 hours and then as needed for moderate to severe pain. 30 tablet 0  . lisinopril-hydrochlorothiazide (PRINZIDE) 10-12.5 MG tablet Take 1 tablet by mouth daily. 30 tablet 3  . methadone (DOLOPHINE) 10 MG/5ML solution Take 55 mg by mouth daily.    Marland Kitchen omeprazole (PRILOSEC) 20 MG capsule Take 1 capsule (20 mg total) by mouth daily. 30  capsule 3  . Phenylephrine-APAP-Guaifenesin (SINUS CONGESTION/PAIN SEVERE PO) Take 1 capsule by mouth daily as needed (sinus pain).    . prochlorperazine (COMPAZINE) 10 MG tablet Take 1 tablet (10 mg total) by mouth every 6 (six) hours as needed for nausea. 20 tablet 0  . acetaminophen (TYLENOL) 500 MG tablet Take 500 mg by mouth every 6 (six) hours as needed for mild pain.     . fluconazole (DIFLUCAN) 100 MG tablet Take 1 tablet (100 mg total) by mouth daily. 7 tablet 0  . magnesium oxide (MAG-OX) 400 (241.3 Mg) MG tablet Take 1 tablet (400 mg total) by mouth daily. (Patient not taking: Reported on 07/13/2016) 30 tablet 1  . potassium chloride SA (K-DUR,KLOR-CON) 20 MEQ tablet Take 1 tablet (20 mEq total) by mouth 2 (two) times daily. (Patient not taking: Reported on 06/30/2016) 14 tablet 0  . simethicone (GAS-X) 80 MG chewable tablet 1 tablet by mouth every 6 hours as needed for burping or flatulence. (Patient not taking: Reported on 06/30/2016) 120 tablet 3   No current facility-administered medications for this encounter.     Physical Findings: The patient is in no acute distress. Patient is alert and oriented.  height is 5\' 1"  (1.549 m) and weight is 148 lb 12.8 oz (67.5 kg). Her oral temperature is 97.8 F (36.6 C). Her blood pressure is 130/84 and her  pulse is 76. Her oxygen saturation is 99%. .  No significant changes.   Lab Findings: Lab Results  Component Value Date   WBC 3.3 (L) 06/29/2016   HGB 10.7 (L) 06/29/2016   HCT 32.3 (L) 06/29/2016   MCV 88.8 06/29/2016   PLT 220 06/29/2016    Radiographic Findings: [place] Intraoperative  Result Date: 07/04/2016 CLINICAL DATA:  Ultrasound was provided for use by the ordering physician, and a technical charge was applied by the performing facility.  No radiologist interpretation/professional services rendered.   [place] Intraoperative  Result Date: 06/23/2016 CLINICAL DATA:  Ultrasound was provided for use by the ordering physician, and a  technical charge was applied by the performing facility.  No radiologist interpretation/professional services rendered.   [place] Intraoperative  Result Date: 06/20/2016 CLINICAL DATA:  Ultrasound was provided for use by the ordering physician, and a technical charge was applied by the performing facility.  No radiologist interpretation/professional services rendered.   Nm Pet Image Restag (ps) Skull Base To Thigh  Result Date: 06/29/2016 CLINICAL DATA:  Subsequent treatment strategy for cervical cancer. EXAM: NUCLEAR MEDICINE PET SKULL BASE TO THIGH TECHNIQUE: 7.3 mCi F-18 FDG was injected intravenously. Full-ring PET imaging was performed from the skull base to thigh after the radiotracer. CT data was obtained and used for attenuation correction and anatomic localization. FASTING BLOOD GLUCOSE:  Value: 87 mg/dl COMPARISON:  Multiple exams, including 04/04/2016 FINDINGS: NECK No hypermetabolic lymph nodes in the neck. Glottic activity is felt to be physiologic. Mucous retention cyst in the left maxillary sinus. CHEST Hypermetabolic activity in left axillary lymph nodes has resolved. Of 1 of these nodes previously measured 7 mm in diameter and currently measures 4 mm in diameter when measured in the same fashion. Mild atherosclerotic calcification of the aortic arch ABDOMEN/PELVIS The cervix no longer appears expanded and has a maximum SUV of approximately 5.6, formerly 21.8. This is overall a dramatic improvement in local disease. However, there is a suspected 7 mm lymph node (previously 5 mm) at a vascular confluence along the right inguinal chain, maximum SUV 6.4, this hypermetabolic activity is new compared to the prior exam. Aortoiliac atherosclerotic vascular disease. 2 mm left kidney upper pole nonobstructive renal calculus. SKELETON No focal hypermetabolic activity to suggest skeletal metastasis. Spondylosis and degenerative disc disease at L4-5 and L5-S1. IMPRESSION: 1. Generally markedly improved in  appearance, with resolved masslike appearance of the cervix and dramatic reduction in cervical metabolic activity. Moreover, the left axillary nodes shown on the prior exam are no longer hypermetabolic. 2. However, there is a new small hypermetabolic focus in the right inguinal region corresponding to a suspected lymph node along a vascular confluence, maximum SUV 6.4. This merits surveillance. 3. Other imaging findings of potential clinical significance: Mucous retention cyst in the left maxillary sinus. Aortic atherosclerosis. Nonobstructive left nephrolithiasis. Spondylosis and degenerative disc disease in the lower lumbar spine. Electronically Signed   By: Van Clines M.D.   On: 06/29/2016 09:30    Impression:  The patient is recovering from the effects of radiation. Probable yeast infection. She has been antibiotics recently.  Plan: Prescribed refill on Dilaudid, Flonase and Xanax. Prescribed Diflucan for presumed vaginal yeast infection. The patient will be seen back for her follow-up on 08/18/16.  ____________________________________    This document serves as a record of services personally performed by [name], MD. It was created on his behalf by Bethann Humble, a trained medical scribe. The creation of this record is based on the scribe's  personal observations and the provider's statements to them. This document has been checked and approved by the attending provider.

## 2016-07-13 NOTE — Telephone Encounter (Signed)
Patient called and requested that the prescriptions for flonase and diflucan be sent to [facility] instead of [facility].  [facility] to transfer prescriptions.  [facility] to cancel prescriptions as well.  [facility] deleted from patient's pharmacy list.

## 2016-08-15 ENCOUNTER — Ambulatory Visit: Payer: Self-pay | Attending: Gynecologic Oncology | Admitting: Gynecologic Oncology

## 2016-08-15 ENCOUNTER — Encounter: Payer: Self-pay | Admitting: Oncology

## 2016-08-15 ENCOUNTER — Ambulatory Visit (HOSPITAL_BASED_OUTPATIENT_CLINIC_OR_DEPARTMENT_OTHER): Payer: Self-pay

## 2016-08-15 VITALS — BP 143/83 | HR 82 | Temp 98.3°F | Resp 18 | Wt 149.1 lb

## 2016-08-15 DIAGNOSIS — C531 Malignant neoplasm of exocervix: Secondary | ICD-10-CM

## 2016-08-15 DIAGNOSIS — F329 Major depressive disorder, single episode, unspecified: Secondary | ICD-10-CM | POA: Insufficient documentation

## 2016-08-15 DIAGNOSIS — R590 Localized enlarged lymph nodes: Secondary | ICD-10-CM

## 2016-08-15 DIAGNOSIS — D61810 Antineoplastic chemotherapy induced pancytopenia: Secondary | ICD-10-CM | POA: Insufficient documentation

## 2016-08-15 DIAGNOSIS — E876 Hypokalemia: Secondary | ICD-10-CM | POA: Insufficient documentation

## 2016-08-15 DIAGNOSIS — F32A Depression, unspecified: Secondary | ICD-10-CM | POA: Insufficient documentation

## 2016-08-15 DIAGNOSIS — F419 Anxiety disorder, unspecified: Secondary | ICD-10-CM | POA: Insufficient documentation

## 2016-08-15 DIAGNOSIS — C530 Malignant neoplasm of endocervix: Secondary | ICD-10-CM | POA: Insufficient documentation

## 2016-08-15 DIAGNOSIS — K219 Gastro-esophageal reflux disease without esophagitis: Secondary | ICD-10-CM | POA: Insufficient documentation

## 2016-08-15 DIAGNOSIS — N950 Postmenopausal bleeding: Secondary | ICD-10-CM | POA: Insufficient documentation

## 2016-08-15 DIAGNOSIS — F17210 Nicotine dependence, cigarettes, uncomplicated: Secondary | ICD-10-CM | POA: Insufficient documentation

## 2016-08-15 DIAGNOSIS — G8929 Other chronic pain: Secondary | ICD-10-CM | POA: Insufficient documentation

## 2016-08-15 DIAGNOSIS — I10 Essential (primary) hypertension: Secondary | ICD-10-CM | POA: Insufficient documentation

## 2016-08-15 DIAGNOSIS — F321 Major depressive disorder, single episode, moderate: Secondary | ICD-10-CM

## 2016-08-15 DIAGNOSIS — Z9221 Personal history of antineoplastic chemotherapy: Secondary | ICD-10-CM | POA: Insufficient documentation

## 2016-08-15 DIAGNOSIS — Z923 Personal history of irradiation: Secondary | ICD-10-CM

## 2016-08-15 DIAGNOSIS — R5383 Other fatigue: Secondary | ICD-10-CM

## 2016-08-15 LAB — CBC & DIFF AND RETIC
BASO%: 0 % (ref 0.0–2.0)
Basophils Absolute: 0 10*3/uL (ref 0.0–0.1)
EOS%: 3.7 % (ref 0.0–7.0)
Eosinophils Absolute: 0.1 10*3/uL (ref 0.0–0.5)
HEMATOCRIT: 34.1 % — AB (ref 34.8–46.6)
HGB: 10.9 g/dL — ABNORMAL LOW (ref 11.6–15.9)
Immature Retic Fract: 2.1 % (ref 1.60–10.00)
LYMPH%: 22.7 % (ref 14.0–49.7)
MCH: 29.8 pg (ref 25.1–34.0)
MCHC: 32 g/dL (ref 31.5–36.0)
MCV: 93.2 fL (ref 79.5–101.0)
MONO#: 0.3 10*3/uL (ref 0.1–0.9)
MONO%: 7.4 % (ref 0.0–14.0)
NEUT#: 2.3 10*3/uL (ref 1.5–6.5)
NEUT%: 66.2 % (ref 38.4–76.8)
PLATELETS: 196 10*3/uL (ref 145–400)
RBC: 3.66 10*6/uL — ABNORMAL LOW (ref 3.70–5.45)
RDW: 13.4 % (ref 11.2–14.5)
RETIC CT ABS: 45.02 10*3/uL (ref 33.70–90.70)
Retic %: 1.23 % (ref 0.70–2.10)
WBC: 3.5 10*3/uL — ABNORMAL LOW (ref 3.9–10.3)
lymph#: 0.8 10*3/uL — ABNORMAL LOW (ref 0.9–3.3)
nRBC: 0 % (ref 0–0)

## 2016-08-15 LAB — BASIC METABOLIC PANEL
ANION GAP: 8 meq/L (ref 3–11)
BUN: 13.5 mg/dL (ref 7.0–26.0)
CO2: 31 mEq/L — ABNORMAL HIGH (ref 22–29)
Calcium: 9.3 mg/dL (ref 8.4–10.4)
Chloride: 105 mEq/L (ref 98–109)
Creatinine: 0.9 mg/dL (ref 0.6–1.1)
EGFR: 78 mL/min/{1.73_m2} — ABNORMAL LOW (ref >90)
GLUCOSE: 113 mg/dL (ref 70–140)
POTASSIUM: 4.3 meq/L (ref 3.5–5.1)
Sodium: 143 mEq/L (ref 136–145)

## 2016-08-15 NOTE — Patient Instructions (Signed)
We will contact you with the results of your labwork from today.  Plan to have a PET scan on July 26 at 86 am at [facility] with arrival at 9:30am.  Nothing to eat or drink after midnight.  You will follow up with Dr. [name] in August or sooner if needed.  Please call for any questions or concerns.

## 2016-08-15 NOTE — Progress Notes (Signed)
Consult Note: Gyn-Onc  Consult was requested by Dr. [name] for the evaluation of Jenavieve Freda 56 y.o. female  CC:  Chief Complaint  Patient presents with  . Malignant neoplasm of the endocervix    Assessment/Plan:  Ms. Rosaura Bolon  is a 56 y.o.  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  Post treatment PET showed excellent response at cervix, however new mildly enlarged PET avid right inguinal node seen.  Has symptoms of major depressive disorder present - referral to psychiatry  Very fatigued - check bmet and cbc to rule out electrolyte disturbance/anemia.  No evidence of persistent disease on today's exam - will order follow-up PET in July to monitor right inguinal region.    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  The patient reports last having a pap smear approximately 4 years ago in [place] which, was followed by a biopsy which, per patient, was "normal" and she was not informed that she would require special followup. She began experiencing postmenopausal bleeding at the beginning of 2016 and continued to bleed intermittently for 2 years.   She presented to the ER in [place] in November (25th), 2017 and a cervical mass was identified on pelvic exam.  She was seen in the office by Dr Baron Sane 03/01/16 who performed cervical biopsies of a friable mass which revealed poorly differentiated squamous cell carcinoma. Attempts were made to notify her of this result on 03/03/16, however a phone call was left to call back and there appears to be delay in her receiving the information about her result.  She presented to the office to be seen by Dr [name] for discussion regarding results on 03/16/16 and was informed of  the results and the need to see oncology.   A PET had been ordered however, the patient cancelled this due to claustrophobia and fear of having a scan and getting bad results.  Interval Hx:   On 04/04/16 she underwent pretreatment PET which showed 5.5 cm hypermetabolic cervical mass, consistent with known primary cervical carcinoma. No definite local or distant metastatic disease.Marland Kitchen  She was treated with primary radiation therapy and radiosensitizing chemotherapy with cisplatin between 04/12/16 and 06/20/16 with 45Gy to the pelvis with 9Gy boost, and 28 Gy additionally to the cervix with intracavitary brachytherapy.  Post treatment imaging on 06/29/16 showed excellent response at cervix but a new slightly enlarged PET avid right inguinal node was present.   Since completing therapy she reports profound weakness and fatigue. She is tearful and depressed in her mood. She has severe back pain. She cannot work due to her symptoms.  Current Meds:  Outpatient Encounter Prescriptions as of 08/15/2016  Medication Sig  . acetaminophen (TYLENOL) 500 MG tablet Take 500 mg by mouth every 6 (six) hours as needed for mild pain.   Marland Kitchen ALPRAZolam (XANAX) 0.25 MG tablet Take 1 tablet (0.25 mg total) by mouth 2 (two) times daily as needed for anxiety.  . fluconazole (DIFLUCAN) 100 MG tablet Take 1 tablet (100 mg total) by mouth daily.  . fluticasone (FLONASE) 50 MCG/ACT nasal spray Place 1 spray into both nostrils daily.  Marland Kitchen HYDROmorphone (DILAUDID) 4 MG tablet Take 1 tablet (4 mg total) by mouth every 4 (four) hours as needed for severe pain.  Marland Kitchen  ibuprofen (ADVIL,MOTRIN) 600 MG tablet Take 1 tablet by mouth every 6 hours for 24 hours and then as needed for moderate to severe pain.  Marland Kitchen lisinopril-hydrochlorothiazide (PRINZIDE) 10-12.5 MG tablet Take 1 tablet by mouth daily.  . magnesium oxide (MAG-OX) 400 (241.3 Mg) MG tablet Take 1 tablet (400 mg total) by mouth daily.  . methadone (DOLOPHINE) 10 MG/5ML solution Take 55  mg by mouth daily.  Marland Kitchen omeprazole (PRILOSEC) 20 MG capsule Take 1 capsule (20 mg total) by mouth daily.  Marland Kitchen Phenylephrine-APAP-Guaifenesin (SINUS CONGESTION/PAIN SEVERE PO) Take 1 capsule by mouth daily as needed (sinus pain).  . potassium chloride SA (K-DUR,KLOR-CON) 20 MEQ tablet Take 1 tablet (20 mEq total) by mouth 2 (two) times daily.  . prochlorperazine (COMPAZINE) 10 MG tablet Take 1 tablet (10 mg total) by mouth every 6 (six) hours as needed for nausea.  . simethicone (GAS-X) 80 MG chewable tablet 1 tablet by mouth every 6 hours as needed for burping or flatulence.   No facility-administered encounter medications on file as of 08/15/2016.     Allergy:  Allergies  Allergen Reactions  . Codeine Itching and Rash  . Fentanyl Rash    Skin rash, local swelling from patch  . Tramadol Other (See Comments)    GI upset,also trembling sensation    Social Hx:   Social History   Social History  . Marital status: Divorced    Spouse name: N/A  . Number of children: 5  . Years of education: N/A   Occupational History  . Not on file.   Social History Main Topics  . Smoking status: Current Every Day Smoker    Packs/day: 0.25    Years: 45.00    Types: Cigarettes  . Smokeless tobacco: Never Used     Comment: 4 cigs daily  . Alcohol use No  . Drug use: No  . Sexual activity: Not Currently   Other Topics Concern  . Not on file   Social History Narrative  . No narrative on file    Past Surgical Hx:  Past Surgical History:  Procedure Laterality Date  . ABCESS DRAINAGE Left 1982   breast  . CARPAL TUNNEL RELEASE Bilateral   . CERVICAL DISC SURGERY  2010  . MULTIPLE EXTRACTIONS WITH ALVEOLOPLASTY N/A 04/08/2016   Procedure: Extraction of tooth #'s [id number] with alveoloplasty  and gross debridement of remaining teeth;  Surgeon: Lenn Cal, DDS;  Location: [facility];  Service: Oral Surgery;  Laterality: N/A;  . TANDEM RING INSERTION N/A 05/26/2016   Procedure: TANDEM RING  INSERTION;  Surgeon: [name], MD;  Location: [facility];  Service: Urology;  Laterality: N/A;  . TANDEM RING INSERTION N/A 06/02/2016   Procedure: TANDEM RING INSERTION;  Surgeon: [name], MD;  Location: [facility];  Service: Urology;  Laterality: N/A;  . TANDEM RING INSERTION N/A 06/20/2016   Procedure: TANDEM RING INSERTION;  Surgeon: [name], MD;  Location: [facility];  Service: Urology;  Laterality: N/A;  . TANDEM RING INSERTION N/A 06/23/2016   Procedure: TANDEM RING INSERTION;  Surgeon: [name], MD;  Location: [facility];  Service: Urology;  Laterality: N/A;  . TANDEM RING INSERTION N/A 07/04/2016   Procedure: TANDEM RING INSERTION;  Surgeon: [name], MD;  Location: [facility];  Service: Urology;  Laterality: N/A;    Past Medical Hx:  Past Medical History:  Diagnosis Date  . Anxiety   . Cervical cancer [facility]) oncologist-  dr gorsuch/ dr Sondra Come   02-20-2016 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---  treatment concurrent chemo (week 6 on hold due to pancytopenia) and radiation therapy (external beam 04-12-2016 to 05-31-2016)  started high-dose rate brachytherapy 05-26-2016  . Chemotherapy-induced thrombocytopenia   . Chronic pain disorder    back,neck-- chronic methadone  . Environmental allergies    allergy to dust mites  . GERD (gastroesophageal reflux disease)   . Headache   . Herniated disc, cervical   . History of external beam radiation therapy 04/12/16-05/31/16   pelvis 45 Gy in 25 fractions, in  30 sessions, pelvis boost 9 Gy in 5 fractions  . History of radiation therapy 06/20/16-07/04/16   tandem and ring applicator to cervix 28 Gy in 5 fractions  . Hypertension   . Hypokalemia   . Hypomagnesemia    po supplement and IV replacement  . Leukopenia due to antineoplastic chemotherapy ([place])   . Lumbar herniated disc   . Pancytopenia due to chemotherapy ([place])    . Periodontitis     Past Gynecological History:  Denies history of abnormal paps. Last pap was 4 years ago in [place] per patient. SVD x 5 No LMP recorded. Patient is postmenopausal.  Family Hx:  Family History  Problem Relation Age of Onset  . Cancer Father   . Cancer Sister        throat  . Cancer Brother        throat and stomach  . Cancer Brother        lung    Review of Systems:  Constitutional  Feels fatigue, sadness, generalized weakness   ENT Normal appearing ears and nares bilaterally Skin/Breast  No rash, sores, jaundice, itching, dryness Cardiovascular  No chest pain, shortness of breath, or edema  Pulmonary  No cough or wheeze.  Gastro Intestinal  No nausea, vomitting, or diarrhoea. No bright red blood per rectum, no abdominal pain, change in bowel movement, + constipation.  Genito Urinary  No frequency, urgency, dysuria, . Musculo Skeletal  + myalgia, arthralgia, pain Neurologic  No weakness, numbness, change in gait,  Psychology  No depression, anxiety, insomnia.   Vitals:  Blood pressure (!) 143/83, pulse 82, temperature 98.3 F (36.8 C), temperature source Oral, resp. rate 18, weight 149 lb 1.6 oz (67.6 kg).  Physical Exam: WD in NAD Neck  Supple NROM, without any enlargements.  Lymph Node Survey No cervical supraclavicular or inguinal adenopathy Cardiovascular  Pulse normal rate, regularity and rhythm. S1 and S2 normal.  Lungs  Clear to auscultation bilateraly, without wheezes/crackles/rhonchi. Good air movement.  Skin  No rash/lesions/breakdown  Psychiatry  Alert and oriented to person, place, and time  Abdomen  Normoactive bowel sounds, abdomen soft, non-tender and nonobese without evidence of hernia.  Back No CVA tenderness Genito Urinary  Vulva/vagina: Normal external female genitalia.  No lesions. No discharge or bleeding.  Bladder/urethra:  No lesions or masses, well supported bladder  Vagina: grossly normal  Cervix: The  cervix is grossly normal. No visible or palpable residual lesion  Uterus:  Small, mobile,  Adnexa: no masses. Rectal  No lesions or masses Extremities  No bilateral cyanosis, clubbing or edema.   Donaciano Eva, MD  08/15/2016, 3:34 PM

## 2016-08-16 ENCOUNTER — Telehealth: Payer: Self-pay

## 2016-08-16 NOTE — Telephone Encounter (Signed)
-----   Message from [name], MD sent at 08/15/2016  6:16 PM EDT ----- Valerie Kerr's labs do not suggest a cause for her symptoms. She has mild anemia and may beneft from some iron replacement therapy but this might make her constipation worse.

## 2016-08-16 NOTE — Telephone Encounter (Signed)
Told Ms Cirrincione the results of the lab work as noted below by Dr. [name].  She can purchase OTC Iron supplements if she wants to see if this would be beneficial.

## 2016-08-18 ENCOUNTER — Encounter: Payer: Self-pay | Admitting: Oncology

## 2016-08-18 ENCOUNTER — Ambulatory Visit
Admission: RE | Admit: 2016-08-18 | Discharge: 2016-08-18 | Disposition: A | Discharge status: Home / Self Care | Payer: Self-pay | Source: Ambulatory Visit | Attending: Radiation Oncology | Admitting: Radiation Oncology

## 2016-08-18 ENCOUNTER — Encounter: Payer: Self-pay | Admitting: Radiation Oncology

## 2016-08-18 VITALS — BP 161/73 | HR 78 | Temp 98.1°F | Ht 61.0 in | Wt 152.0 lb

## 2016-08-18 DIAGNOSIS — R350 Frequency of micturition: Secondary | ICD-10-CM | POA: Insufficient documentation

## 2016-08-18 DIAGNOSIS — C530 Malignant neoplasm of endocervix: Secondary | ICD-10-CM | POA: Insufficient documentation

## 2016-08-18 DIAGNOSIS — F418 Other specified anxiety disorders: Secondary | ICD-10-CM | POA: Insufficient documentation

## 2016-08-18 LAB — URINALYSIS, MICROSCOPIC - CHCC
BILIRUBIN (URINE): NEGATIVE
Glucose: NEGATIVE mg/dL
Ketones: NEGATIVE mg/dL
Nitrite: NEGATIVE
Protein: NEGATIVE mg/dL
SPECIFIC GRAVITY, URINE: 1.01 (ref 1.003–1.035)
UROBILINOGEN UR: 0.2 mg/dL (ref 0.2–1)
pH: 6.5 (ref 4.6–8.0)

## 2016-08-18 MED ORDER — HYDROMORPHONE HCL 4 MG PO TABS: 4.0000 mg | ORAL_TABLET | ORAL | 0 refills | Status: DC | PRN

## 2016-08-18 MED ORDER — ALPRAZOLAM 0.25 MG PO TABS: 0.2500 mg | ORAL_TABLET | Freq: Two times a day (BID) | ORAL | 0 refills | Status: DC | PRN

## 2016-08-18 MED FILL — HYDROmorphone HCL 4 MG TABS: 4 | 4 days supply | Qty: 25 | Fill #0

## 2016-08-18 MED FILL — ALPRAZolam 0.25 MG TABS: 0.25 | 15 days supply | Qty: 30 | Fill #0

## 2016-08-18 NOTE — Progress Notes (Signed)
Radiation Oncology         [phone] ________________________________  Name: Valerie Kerr MRN: [id number]  Date: 08/18/2016  DOB: 26-Aug-1960  Follow-Up Visit Note  CC: [name], MD  [name], MD    ICD-9-CM ICD-10-CM   1. Urinary frequency 788.41 R35.0 Urinalysis, Microscopic - [facility]     Urine culture  2. Malignant neoplasm of endocervix (HCC) 180.0 C53.0   3. Situational anxiety 300.09 F41.8 ALPRAZolam (XANAX) 0.25 MG tablet    Diagnosis:   Stage IIB poorly differentiated squamous cell carcinoma of the cervix  Interval Since Last Radiation:  6 weeks  04/12/16-05/31/16: 45 Gy to the pelvis + 9 Gy boost 06/20/16-07/04/16: 28 Gy to the cervix  Narrative:  The patient returns today for routine follow-up.  Patient endorses increased urination frequency. She also endorses fatigue and dizziness when attempting to complete daily tasks in her home. She is tearful and depressed in her mood. She reports her ex-husband and brother have passed away in the last 5 months. She has severe back pain. She cannot work due to her symptoms.  She cannot work due to her symptoms.   Patient does report yeast infection was treated successfully with Diflucan. She requests refill for Dilaudid and xanax which she takes prn.                     ALLERGIES:  is allergic to codeine; fentanyl; and tramadol.  Meds: Current Outpatient Prescriptions  Medication Sig Dispense Refill  . ALPRAZolam (XANAX) 0.25 MG tablet Take 1 tablet (0.25 mg total) by mouth 2 (two) times daily as needed for anxiety. 30 tablet 0  . fluticasone (FLONASE) 50 MCG/ACT nasal spray Place 1 spray into both nostrils daily. 16 g 1  . HYDROmorphone (DILAUDID) 4 MG tablet Take 1 tablet (4 mg total) by mouth every 4 (four) hours as needed for severe pain. 25 tablet 0  . ibuprofen (ADVIL,MOTRIN) 600 MG tablet Take 1 tablet by mouth every 6 hours for 24 hours and then as needed for moderate to severe pain. 30 tablet 0  .  lisinopril-hydrochlorothiazide (PRINZIDE) 10-12.5 MG tablet Take 1 tablet by mouth daily. 30 tablet 3  . methadone (DOLOPHINE) 10 MG/5ML solution Take 55 mg by mouth daily.    Marland Kitchen omeprazole (PRILOSEC) 20 MG capsule Take 1 capsule (20 mg total) by mouth daily. 30 capsule 3  . Phenylephrine-APAP-Guaifenesin (SINUS CONGESTION/PAIN SEVERE PO) Take 1 capsule by mouth daily as needed (sinus pain).    . prochlorperazine (COMPAZINE) 10 MG tablet Take 1 tablet (10 mg total) by mouth every 6 (six) hours as needed for nausea. 20 tablet 0  . acetaminophen (TYLENOL) 500 MG tablet Take 500 mg by mouth every 6 (six) hours as needed for mild pain.     . magnesium oxide (MAG-OX) 400 (241.3 Mg) MG tablet Take 1 tablet (400 mg total) by mouth daily. (Patient not taking: Reported on 08/18/2016) 30 tablet 1  . potassium chloride SA (K-DUR,KLOR-CON) 20 MEQ tablet Take 1 tablet (20 mEq total) by mouth 2 (two) times daily. (Patient not taking: Reported on 08/18/2016) 14 tablet 0  . simethicone (GAS-X) 80 MG chewable tablet 1 tablet by mouth every 6 hours as needed for burping or flatulence. (Patient not taking: Reported on 08/18/2016) 120 tablet 3   No current facility-administered medications for this encounter.     Physical Findings: The patient is in no acute distress. Patient is alert and oriented.  height is 5\' 1"  (1.549 m)  and weight is 152 lb (68.9 kg). Her oral temperature is 98.1 F (36.7 C). Her blood pressure is 161/73 (abnormal) and her pulse is 78. Her oxygen saturation is 99%. .  Lungs are clear to auscultation bilaterally. Heart has regular rate and rhythm. No palpable cervical, supraclavicular, or axillary adenopathy. Abdomen soft, non-tender, normal bowel sounds. Pelvic exam not performed in light of recent exam by Dr. [name]. She noted no palpable inguinal adenopathy. No active disease at the cervical region.  Lab Findings: Lab Results  Component Value Date   WBC 3.5 (L) 08/15/2016   HGB 10.9 (L)  08/15/2016   HCT 34.1 (L) 08/15/2016   MCV 93.2 08/15/2016   PLT 196 08/15/2016    Radiographic Findings: No results found.  Impression:  The patient is recovering from the effects of radiation. Overall pain is better. She is only taking Dilaudid occasionaly.      Plan:  Will order urinalysis and culture based on reports of increased urinary frequency. She is scheduled to see Dr. [name] on 11/14/16 after PET scan is completed on 10/20/16. Patient seems too tired and upset to return to work at this time. I refilled her Xanax and Dilaudid. Dr. [name] has requested a psychiatric referral for depressed mode.  Recent blood work does not explain her fatigue. October follow-up in Rad/Onc.  She was given a vaginal dilator and instructions on its use. Letter dictated for the patient's upcoming disability  Evaluation. -----------------------------------  [name], PhD, MD  This document serves as a record of services personally performed by [name], MD. It was created on his behalf by Linward Natal, a trained medical scribe. The creation of this record is based on the scribe's personal observations and the provider's statements to them. This document has been checked and approved by the attending provider.

## 2016-08-18 NOTE — Progress Notes (Signed)
Valerie Kerr is here for follow up.  She reports having sharp pain in her lower abdomen that started after radiation.  She is also having lower back pain.  She said she was taking dilaudid but ran out on Monday.  She did take 2 ibuprofen last night.  She is feeling weak and depressed.  She denies having any bladder/bowel issues.  She denies having vaginal bleeding but does have a yellowish vaginal discharge.  She reports having nausea and takes compazine as needed.  She was given a size S+ and M vaginal dilator and was instructed on how to use them.  BP (!) 161/73 (BP Location: Left Arm, Patient Position: Sitting)   Pulse 78   Temp 98.1 F (36.7 C) (Oral)   Ht 5\' 1"  (1.549 m)   Wt 152 lb (68.9 kg)   SpO2 99%   BMI 28.72 kg/m    Wt Readings from Last 3 Encounters:  08/18/16 152 lb (68.9 kg)  08/15/16 149 lb 1.6 oz (67.6 kg)  07/13/16 148 lb 12.8 oz (67.5 kg)

## 2016-08-18 NOTE — Progress Notes (Signed)
  Home Care Instructions for the Insertion and Care of Your Vaginal Dilator  Why Do I Need a Vaginal Dilator?  Internal radiation therapy may cause scar tissue to form at the top of your vagina (vaginal cuff).  This may make vaginal examinations difficult in the future. You can prevent scar tissue from forming by using a vaginal dilator (a smooth plastic rod), and/or by having regular sexual intercourse.  If not using the dilator you should be having intercourse two or three times a week.  If you are unable to have intercourse, you should use your vaginal dilator.  You may have some spotting or bleeding from your dilator or intercourse the first few times. You may also have some discomfort. If discomfort occurs with intercourse, you and your partner may need to stop for a while and try again later.  How to Use Your Vaginal Dilator  - Wash the dilator with soap and water before and after each use. - Check the dilator to be sure it is smooth. Do not use the dilator if you find any roughspots. - Coat the dilator with K-Y Jelly, Astroglide, or Replens. Do not use Vaseline, baby oil, or other oil based lubricants. They are not water-soluble and can be irritating to the tissues in the vagina. - Lie on your back with your knees bent and legs apart. - Insert the rounded end of the dilator into your vagina as far as it will go without causing pain or discomfort. - Close your knees and slowly straighten your legs. - Keep the dilator in your vagina for about 10 to 15 minutes.  Use 3 times a week (for example: Monday, Wednesday and Friday's). [facility] your knees, open your legs, and gently remove the dilator. - Gently cleanse the skin around the vaginal opening. - Wash the dilator after each use. -  It is important that you use the dilator routinely until instructed otherwise by your doctor.

## 2016-08-21 LAB — URINE CULTURE

## 2016-08-23 ENCOUNTER — Encounter: Payer: Self-pay | Admitting: Gynecologic Oncology

## 2016-08-23 ENCOUNTER — Telehealth: Payer: Self-pay | Admitting: *Deleted

## 2016-08-23 NOTE — Telephone Encounter (Signed)
Contacted the patient, gave her the name and numbers for [facility] (behavioral health service). Explained to the patient "she is to call that office the day she wants an appt." Patient given both phone numbers and the address of the center.   [facility]  [phone]

## 2016-08-23 NOTE — Progress Notes (Signed)
Records faxed to [facility] for psych services Fax [phone].  Patient is aware she will need to call same day for an appt.

## 2016-08-24 ENCOUNTER — Telehealth: Payer: Self-pay | Admitting: Oncology

## 2016-08-24 DIAGNOSIS — R350 Frequency of micturition: Secondary | ICD-10-CM

## 2016-08-24 NOTE — Telephone Encounter (Signed)
Valerie Kerr called back and said she can come for labs at 1030 tomorrow.

## 2016-08-24 NOTE — Telephone Encounter (Signed)
Called patient to see when she can come in for repeat UA.  She said she can come in when her daughter is off tomorrow.  She will call back with the time.

## 2016-08-24 NOTE — Addendum Note (Signed)
Addended by: Elmo Putt R on: 08/24/2016 03:19 PM   Modules accepted: Orders

## 2016-08-25 ENCOUNTER — Ambulatory Visit: Payer: Self-pay

## 2016-08-25 ENCOUNTER — Other Ambulatory Visit: Payer: Self-pay

## 2016-08-26 ENCOUNTER — Ambulatory Visit
Admission: RE | Admit: 2016-08-26 | Discharge: 2016-08-26 | Disposition: A | Discharge status: Home / Self Care | Payer: Self-pay | Source: Ambulatory Visit | Attending: Radiation Oncology | Admitting: Radiation Oncology

## 2016-08-26 DIAGNOSIS — R350 Frequency of micturition: Secondary | ICD-10-CM

## 2016-08-26 LAB — URINALYSIS, MICROSCOPIC - CHCC
Bilirubin (Urine): NEGATIVE
Blood: NEGATIVE
GLUCOSE UR CHCC: NEGATIVE mg/dL
KETONES: NEGATIVE mg/dL
Nitrite: NEGATIVE
PROTEIN: NEGATIVE mg/dL
SPECIFIC GRAVITY, URINE: 1.01 (ref 1.003–1.035)
UROBILINOGEN UR: 0.2 mg/dL (ref 0.2–1)
pH: 6 (ref 4.6–8.0)

## 2016-08-28 LAB — URINE CULTURE

## 2016-08-29 ENCOUNTER — Telehealth: Payer: Self-pay | Admitting: Oncology

## 2016-08-29 NOTE — Addendum Note (Signed)
Addendum  created 08/29/16 1241 by Myrtie Soman, MD   Sign clinical note

## 2016-08-29 NOTE — Telephone Encounter (Signed)
Called patient and advised her that her urine culture was normal.  She verbalized agreement and understanding.  She also mentioned that she continues to have fatigue.  She said she is going to address this with her primary care doctor on Monday.

## 2016-08-29 NOTE — Addendum Note (Signed)
Addendum  created 08/29/16 1144 by Myrtie Soman, MD   Sign clinical note

## 2016-08-29 NOTE — Anesthesia Postprocedure Evaluation (Signed)
Anesthesia Post Note  Patient: Valerie Kerr  Procedure(s) Performed: Procedure(s) (LRB): TANDEM RING INSERTION (N/A)     Anesthesia Post Evaluation  Last Vitals:  Vitals:   06/20/16 1015 06/20/16 1030  BP: 119/79 126/71  Pulse: 79 83  Resp: (!) 9 (!) 0  Temp:      Last Pain:  Vitals:   06/20/16 0739  TempSrc: Oral                 Gilbert Narain S

## 2016-08-30 ENCOUNTER — Encounter: Payer: Self-pay | Admitting: Pharmacy Technician

## 2016-08-30 NOTE — Progress Notes (Signed)
The patient is on Medicaid and drug assistance is closed. Contact pharmacy if future assistance is needed.

## 2016-08-31 ENCOUNTER — Telehealth: Payer: Self-pay | Admitting: Oncology

## 2016-08-31 NOTE — Telephone Encounter (Signed)
Patient left a message asking if she can drop of paperwork for housing tomorrow.  Left her a message saying that she can leave the paperwork with the staff in the radiation waiting room.

## 2016-09-02 NOTE — Addendum Note (Signed)
Addendum  created 09/02/16 1144 by Lyn Hollingshead, MD   Sign clinical note

## 2016-09-02 NOTE — Addendum Note (Signed)
Addendum  created 09/02/16 2671 by Lyn Hollingshead, MD   Sign clinical note

## 2016-09-05 ENCOUNTER — Encounter: Payer: Self-pay | Admitting: Family Medicine

## 2016-09-05 ENCOUNTER — Ambulatory Visit: Payer: Self-pay | Attending: Family Medicine | Admitting: Family Medicine

## 2016-09-05 VITALS — BP 131/81 | HR 74 | Temp 98.3°F | Resp 18 | Ht 61.0 in | Wt 150.0 lb

## 2016-09-05 DIAGNOSIS — W57XXXA Bitten or stung by nonvenomous insect and other nonvenomous arthropods, initial encounter: Secondary | ICD-10-CM | POA: Insufficient documentation

## 2016-09-05 DIAGNOSIS — I10 Essential (primary) hypertension: Secondary | ICD-10-CM | POA: Insufficient documentation

## 2016-09-05 DIAGNOSIS — G629 Polyneuropathy, unspecified: Secondary | ICD-10-CM | POA: Insufficient documentation

## 2016-09-05 DIAGNOSIS — Z923 Personal history of irradiation: Secondary | ICD-10-CM | POA: Insufficient documentation

## 2016-09-05 DIAGNOSIS — M5412 Radiculopathy, cervical region: Secondary | ICD-10-CM | POA: Insufficient documentation

## 2016-09-05 DIAGNOSIS — K219 Gastro-esophageal reflux disease without esophagitis: Secondary | ICD-10-CM | POA: Insufficient documentation

## 2016-09-05 DIAGNOSIS — L539 Erythematous condition, unspecified: Secondary | ICD-10-CM | POA: Insufficient documentation

## 2016-09-05 DIAGNOSIS — Z79899 Other long term (current) drug therapy: Secondary | ICD-10-CM | POA: Insufficient documentation

## 2016-09-05 DIAGNOSIS — Z9221 Personal history of antineoplastic chemotherapy: Secondary | ICD-10-CM | POA: Insufficient documentation

## 2016-09-05 DIAGNOSIS — Z7951 Long term (current) use of inhaled steroids: Secondary | ICD-10-CM | POA: Insufficient documentation

## 2016-09-05 DIAGNOSIS — M5126 Other intervertebral disc displacement, lumbar region: Secondary | ICD-10-CM | POA: Insufficient documentation

## 2016-09-05 DIAGNOSIS — C539 Malignant neoplasm of cervix uteri, unspecified: Secondary | ICD-10-CM | POA: Insufficient documentation

## 2016-09-05 DIAGNOSIS — G8929 Other chronic pain: Secondary | ICD-10-CM | POA: Insufficient documentation

## 2016-09-05 MED ORDER — HYDROCORTISONE 2.5 % EX CREA: TOPICAL_CREAM | Freq: Two times a day (BID) | CUTANEOUS | 0 refills | Status: DC

## 2016-09-05 MED ORDER — OMEPRAZOLE 20 MG PO CPDR: 20.0000 mg | DELAYED_RELEASE_CAPSULE | Freq: Every day | ORAL | 3 refills | Status: DC

## 2016-09-05 MED ORDER — GABAPENTIN 300 MG PO CAPS: 300.0000 mg | ORAL_CAPSULE | Freq: Two times a day (BID) | ORAL | 3 refills | Status: DC

## 2016-09-05 MED ORDER — LISINOPRIL-HYDROCHLOROTHIAZIDE 10-12.5 MG PO TABS: 1.0000 | ORAL_TABLET | Freq: Every day | ORAL | 3 refills | Status: DC

## 2016-09-05 MED FILL — GABAPENTIN 300 MG CAPSULE: 300 | 30 days supply | Qty: 60 | Fill #0

## 2016-09-05 MED FILL — ?OMEPRAZOLE DR 20 MG CAPSUL: 20 | 30 days supply | Qty: 30 | Fill #0

## 2016-09-05 MED FILL — LISINOPRIL-HCTZ 10-12.5 MG: 10-12.5 | 30 days supply | Qty: 30 | Fill #0

## 2016-09-05 MED FILL — HYDROCORTISONE 2.5% CREAM: 2.5 | 15 days supply | Qty: 30 | Fill #0

## 2016-09-05 NOTE — Progress Notes (Signed)
Patient is here for FU  Patient complains of generalized body pains being present and scaled at an 8 currently.  Patient has taken medication today. Patient has eaten today.

## 2016-09-05 NOTE — Patient Instructions (Signed)
Neuropathic Pain Neuropathic pain is pain caused by damage to the nerves that are responsible for certain sensations in your body (sensory nerves). The pain can be caused by damage to:  The sensory nerves that send signals to your spinal cord and brain (peripheral nervous system).  The sensory nerves in your brain or spinal cord (central nervous system).  Neuropathic pain can make you more sensitive to pain. What would be a minor sensation for most people may feel very painful if you have neuropathic pain. This is usually a long-term condition that can be difficult to treat. The type of pain can differ from person to person. It may start suddenly (acute), or it may develop slowly and last for a long time (chronic). Neuropathic pain may come and go as damaged nerves heal or may stay at the same level for years. It often causes emotional distress, loss of sleep, and a lower quality of life. What are the causes? The most common cause of damage to a sensory nerve is diabetes. Many other diseases and conditions can also cause neuropathic pain. Causes of neuropathic pain can be classified as:  Toxic. Many drugs and chemicals can cause toxic damage. The most common cause of toxic neuropathic pain is damage from drug treatment for cancer (chemotherapy).  Metabolic. This type of pain can happen when a disease causes imbalances that damage nerves. Diabetes is the most common of these diseases. Vitamin B deficiency caused by long-term alcohol abuse is another common cause.  Traumatic. Any injury that cuts, crushes, or stretches a nerve can cause damage and pain. A common example is feeling pain after losing an arm or leg (phantom limb pain).  Compression-related. If a sensory nerve gets trapped or compressed for a long period of time, the blood supply to the nerve can be cut off.  Vascular. Many blood vessel diseases can cause neuropathic pain by decreasing blood supply and oxygen to nerves.  Autoimmune.  This type of pain results from diseases in which the body's defense system mistakenly attacks sensory nerves. Examples of autoimmune diseases that can cause neuropathic pain include lupus and multiple sclerosis.  Infectious. Many types of viral infections can damage sensory nerves and cause pain. Shingles infection is a common cause of this type of pain.  Inherited. Neuropathic pain can be a symptom of many diseases that are passed down through families (genetic).  What are the signs or symptoms? The main symptom is pain. Neuropathic pain is often described as:  Burning.  Shock-like.  Stinging.  Hot or cold.  Itching.  How is this diagnosed? No single test can diagnose neuropathic pain. Your health care provider will do a physical exam and ask you about your pain. You may use a pain scale to describe how bad your pain is. You may also have tests to see if you have a high sensitivity to pain and to help find the cause and location of any sensory nerve damage. These tests may include:  Imaging studies, such as: ? X-rays. ? CT scan. ? MRI.  Nerve conduction studies to test how well nerve signals travel through your sensory nerves (electrodiagnostic testing).  Stimulating your sensory nerves through electrodes on your skin and measuring the response in your spinal cord and brain (somatosensory evoked potentials).  How is this treated? Treatment for neuropathic pain may change over time. You may need to try different treatment options or a combination of treatments. Some options include:  Over-the-counter pain relievers.  Prescription medicines. Some medicines   used to treat other conditions may also help neuropathic pain. These include medicines to: ? Control seizures (anticonvulsants). ? Relieve depression (antidepressants).  Prescription-strength pain relievers (narcotics). These are usually used when other pain relievers do not help.  Transcutaneous nerve stimulation (TENS).  This uses electrical currents to block painful nerve signals. The treatment is painless.  Topical and local anesthetics. These are medicines that numb the nerves. They can be injected as a nerve block or applied to the skin.  Alternative treatments, such as: ? Acupuncture. ? Meditation. ? Massage. ? Physical therapy. ? Pain management programs. ? Counseling.  Follow these instructions at home:  Learn as much as you can about your condition.  Take medicines only as directed by your health care provider.  Work closely with all your health care providers to find what works best for you.  Have a good support system at home.  Consider joining a chronic pain support group. Contact a health care provider if:  Your pain treatments are not helping.  You are having side effects from your medicines.  You are struggling with fatigue, mood changes, depression, or anxiety. This information is not intended to replace advice given to you by your health care provider. Make sure you discuss any questions you have with your health care provider. Document Released: 12/10/2003 Document Revised: 10/02/2015 Document Reviewed: 08/22/2013 Elsevier Interactive Patient Education  2018 [employer]  

## 2016-09-05 NOTE — Progress Notes (Signed)
Subjective:  Patient ID: Valerie Kerr, female    DOB: Jan 01, 1961  Age: 56 y.o. 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  Last visit to GYN oncology and radiation oncology was a month ago; posttreatment PET revealed dramatic reduction in cervical metabolic activity however there was a presence of small new hypermetabolic focus in the right inguinal region corresponding to a suspected lymph node with a maximum SUV of 6.4 which meets criteria for surveillance. She has been compliant with her antihypertensive and her PPI but complains of an "electrifying pain all over her body" which occurs both night and day. She also complains of numbness in both arms; pain prevents her from getting up early in the morning. She does have a history of degenerative disc disease of the lumbar spine and is wondering if she can have an MRI done. States she tried gabapentin in the past with no relief of symptoms and Lyrica made her sick. She currently does not take medication for her symptoms.  She complains of a bug bite on her right forearm x2 days and also her left little toe.  Past Medical History:  Diagnosis Date  . Anxiety   . 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 Chemotherapy-induced thrombocytopenia   . Chronic pain disorder    back,neck-- chronic methadone  . Environmental allergies    allergy to dust mites  . GERD (gastroesophageal reflux disease)   . Headache     . Herniated disc, cervical   . History of external beam radiation therapy 04/12/16-05/31/16   pelvis 45 Gy in 25 fractions, in  30 sessions, pelvis boost 9 Gy in 5 fractions  . History of radiation therapy 06/20/16-07/04/16   tandem and ring applicator to cervix 28 Gy in 5 fractions  . Hypertension   . Hypokalemia   . Hypomagnesemia    po supplement and IV replacement  . Leukopenia due to antineoplastic chemotherapy ([place])   . Lumbar herniated disc   . Pancytopenia due to chemotherapy ([place])   . Periodontitis     Past Surgical History:  Procedure Laterality Date  . ABCESS DRAINAGE Left 1982   breast  . CARPAL TUNNEL RELEASE Bilateral   . CERVICAL DISC SURGERY  2010  . MULTIPLE EXTRACTIONS WITH ALVEOLOPLASTY N/A 04/08/2016   Procedure: Extraction of tooth #'s [phone] with alveoloplasty  and gross debridement of remaining teeth;  Surgeon: Lenn Cal, DDS;  Location: [place];  Service: Oral Surgery;  Laterality: N/A;  . TANDEM RING INSERTION N/A 05/26/2016   Procedure: TANDEM RING INSERTION;  Surgeon: [name], MD;  Location: [facility];  Service: Urology;  Laterality: N/A;  . TANDEM RING INSERTION N/A 06/02/2016   Procedure: TANDEM RING INSERTION;  Surgeon: [name], MD;  Location: [facility];  Service: Urology;  Laterality: N/A;  . TANDEM RING INSERTION N/A 06/20/2016   Procedure: TANDEM RING INSERTION;  Surgeon: [name], MD;  Location: [facility];  Service: Urology;  Laterality: N/A;  . TANDEM RING INSERTION N/A  06/23/2016   Procedure: TANDEM RING INSERTION;  Surgeon: [name], MD;  Location[facility];  Service: Urology;  Laterality: N/A;  . TANDEM RING INSERTION N/A 07/04/2016   Procedure: TANDEM RING INSERTION;  Surgeon: [name], MD;  Location: [facility];  Service: Urology;  Laterality: N/A;     Outpatient Medications Prior to Visit  Medication Sig Dispense Refill  .  acetaminophen (TYLENOL) 500 MG tablet Take 500 mg by mouth every 6 (six) hours as needed for mild pain.     Marland Kitchen ALPRAZolam (XANAX) 0.25 MG tablet Take 1 tablet (0.25 mg total) by mouth 2 (two) times daily as needed for anxiety. 30 tablet 0  . fluticasone (FLONASE) 50 MCG/ACT nasal spray Place 1 spray into both nostrils daily. 16 g 1  . HYDROmorphone (DILAUDID) 4 MG tablet Take 1 tablet (4 mg total) by mouth every 4 (four) hours as needed for severe pain. 25 tablet 0  . ibuprofen (ADVIL,MOTRIN) 600 MG tablet Take 1 tablet by mouth every 6 hours for 24 hours and then as needed for moderate to severe pain. 30 tablet 0  . methadone (DOLOPHINE) 10 MG/5ML solution Take 55 mg by mouth daily.    Marland Kitchen Phenylephrine-APAP-Guaifenesin (SINUS CONGESTION/PAIN SEVERE PO) Take 1 capsule by mouth daily as needed (sinus pain).    . prochlorperazine (COMPAZINE) 10 MG tablet Take 1 tablet (10 mg total) by mouth every 6 (six) hours as needed for nausea. 20 tablet 0  . simethicone (GAS-X) 80 MG chewable tablet 1 tablet by mouth every 6 hours as needed for burping or flatulence. 120 tablet 3  . lisinopril-hydrochlorothiazide (PRINZIDE) 10-12.5 MG tablet Take 1 tablet by mouth daily. 30 tablet 3  . omeprazole (PRILOSEC) 20 MG capsule Take 1 capsule (20 mg total) by mouth daily. 30 capsule 3  . magnesium oxide (MAG-OX) 400 (241.3 Mg) MG tablet Take 1 tablet (400 mg total) by mouth daily. (Patient not taking: Reported on 08/18/2016) 30 tablet 1  . potassium chloride SA (K-DUR,KLOR-CON) 20 MEQ tablet Take 1 tablet (20 mEq total) by mouth 2 (two) times daily. (Patient not taking: Reported on 08/18/2016) 14 tablet 0   No facility-administered medications prior to visit.     ROS Review of Systems  Constitutional: Negative for activity change, appetite change and fatigue.  HENT: Negative for congestion, sinus pressure and sore throat.   Eyes: Negative for visual disturbance.  Respiratory: Negative for cough, chest tightness,  shortness of breath and wheezing.   Cardiovascular: Negative for chest pain and palpitations.  Gastrointestinal: Negative for abdominal distention, abdominal pain and constipation.  Endocrine: Negative for polydipsia.  Genitourinary: Negative for dysuria and frequency.  Musculoskeletal:       See hpi  Skin: Negative for rash.  Neurological: Positive for numbness. Negative for tremors and light-headedness.  Hematological: Does not bruise/bleed easily.  Psychiatric/Behavioral: Negative for agitation and behavioral problems.    Objective:  BP 131/81 (BP Location: Left Arm, Patient Position: Sitting, Cuff Size: Large)   Pulse 74   Temp 98.3 F (36.8 C) (Oral)   Resp 18   Ht 5\' 1"  (1.549 m)   Wt 150 lb (68 kg)   SpO2 96%   BMI 28.34 kg/m   BP/Weight 09/05/2016 08/18/2016 06/18/5571  Systolic BP 220 254 270  Diastolic BP 81 73 83  Wt. (Lbs) 150 152 149.1  BMI 28.34 28.72 28.17      Physical Exam  Constitutional: She is oriented to person, place, and time. She  appears well-developed and well-nourished.  Cardiovascular: Normal rate, normal heart sounds and intact distal pulses.   No murmur heard. Pulmonary/Chest: Effort normal and breath sounds normal. She has no wheezes. She has no rales. She exhibits no tenderness.  Abdominal: Soft. Bowel sounds are normal. She exhibits no distension and no mass. There is no tenderness.  Musculoskeletal: She exhibits tenderness (light tenderness on palpation of the lumbar spine; negative straight leg raise).  Neurological: She is alert and oriented to person, place, and time.  Skin:  Erythematous induration on right forearm and left little toe  Psychiatric: She has a normal mood and affect.     Assessment & Plan:   1. Malignant neoplasm of cervix, unspecified site [facility]) Status post radiation  2. Hypertension - lisinopril-hydrochlorothiazide (PRINZIDE) 10-12.5 MG tablet; Take 1 tablet by mouth daily.  Dispense: 30 tablet; Refill: 3  3.  Gastroesophageal reflux disease, esophagitis presence not specified Stable - omeprazole (PRILOSEC) 20 MG capsule; Take 1 capsule (20 mg total) by mouth daily.  Dispense: 30 capsule; Refill: 3  4. Lumbar herniated disc Placed on gabapentin She also has Dilaudid which she receives from the [place] consider MRI at next visit  5. Cervical radiculopathy S/p previous cervical surgery   6. Neuropathy Could also be chemoradiation related - gabapentin (NEURONTIN) 300 MG capsule; Take 1 capsule (300 mg total) by mouth 2 (two) times daily.  Dispense: 60 capsule; Refill: 3  7. Bug bite, initial encounter - hydrocortisone 2.5 % cream; Apply topically 2 (two) times daily.  Dispense: 30 g; Refill: 0   Meds ordered this encounter  Medications  . lisinopril-hydrochlorothiazide (PRINZIDE) 10-12.5 MG tablet    Sig: Take 1 tablet by mouth daily.    Dispense:  30 tablet    Refill:  3  . gabapentin (NEURONTIN) 300 MG capsule    Sig: Take 1 capsule (300 mg total) by mouth 2 (two) times daily.    Dispense:  60 capsule    Refill:  3  . omeprazole (PRILOSEC) 20 MG capsule    Sig: Take 1 capsule (20 mg total) by mouth daily.    Dispense:  30 capsule    Refill:  3  . hydrocortisone 2.5 % cream    Sig: Apply topically 2 (two) times daily.    Dispense:  30 g    Refill:  0    Follow-up: Return in about 6 weeks (around 10/17/2016) for Follow-up for chronic medical conditions.   [name] MD

## 2016-09-07 ENCOUNTER — Telehealth: Payer: Self-pay | Admitting: Oncology

## 2016-09-07 NOTE — Telephone Encounter (Signed)
Left a message for patient regarding her housing form.  Requested a return call.

## 2016-09-08 ENCOUNTER — Other Ambulatory Visit: Payer: Self-pay | Admitting: Radiation Oncology

## 2016-09-08 ENCOUNTER — Encounter: Payer: Self-pay | Admitting: *Deleted

## 2016-09-08 DIAGNOSIS — C530 Malignant neoplasm of endocervix: Secondary | ICD-10-CM

## 2016-09-08 MED ORDER — HYDROMORPHONE HCL 4 MG PO TABS: 4.0000 mg | ORAL_TABLET | ORAL | 0 refills | Status: DC | PRN

## 2016-09-08 NOTE — Progress Notes (Signed)
Completed paperwork for [employer] for patient at her request.  Copy sent for scanning.  Patient to pick up copy for her submission.  Signed by Dr. Sondra Come.

## 2016-09-09 MED FILL — HYDROmorphone HCL 4 MG TABS: 4 | 4 days supply | Qty: 25 | Fill #0

## 2016-09-09 MED FILL — FLUTICASONE PROP 50 MCG SPR: 50 | 30 days supply | Qty: 16 | Fill #1

## 2016-09-13 ENCOUNTER — Ambulatory Visit: Payer: Self-pay

## 2016-10-08 ENCOUNTER — Encounter (HOSPITAL_COMMUNITY): Payer: Self-pay | Admitting: Emergency Medicine

## 2016-10-08 DIAGNOSIS — Z885 Allergy status to narcotic agent status: Secondary | ICD-10-CM | POA: Insufficient documentation

## 2016-10-08 DIAGNOSIS — M545 Low back pain: Secondary | ICD-10-CM | POA: Insufficient documentation

## 2016-10-08 DIAGNOSIS — Z79899 Other long term (current) drug therapy: Secondary | ICD-10-CM | POA: Insufficient documentation

## 2016-10-08 DIAGNOSIS — M5432 Sciatica, left side: Secondary | ICD-10-CM | POA: Insufficient documentation

## 2016-10-08 DIAGNOSIS — F17210 Nicotine dependence, cigarettes, uncomplicated: Secondary | ICD-10-CM | POA: Insufficient documentation

## 2016-10-08 DIAGNOSIS — Z8541 Personal history of malignant neoplasm of cervix uteri: Secondary | ICD-10-CM | POA: Insufficient documentation

## 2016-10-08 DIAGNOSIS — I10 Essential (primary) hypertension: Secondary | ICD-10-CM | POA: Insufficient documentation

## 2016-10-08 DIAGNOSIS — M25562 Pain in left knee: Secondary | ICD-10-CM | POA: Insufficient documentation

## 2016-10-08 DIAGNOSIS — M5431 Sciatica, right side: Secondary | ICD-10-CM | POA: Insufficient documentation

## 2016-10-09 ENCOUNTER — Emergency Department (HOSPITAL_COMMUNITY)
Admission: EM | Admit: 2016-10-09 | Discharge: 2016-10-09 | Disposition: A | Discharge status: Home / Self Care | Payer: Medicaid Other | Attending: Emergency Medicine | Admitting: Emergency Medicine

## 2016-10-09 ENCOUNTER — Emergency Department (HOSPITAL_COMMUNITY): Payer: Medicaid Other

## 2016-10-09 DIAGNOSIS — M5431 Sciatica, right side: Secondary | ICD-10-CM

## 2016-10-09 DIAGNOSIS — M545 Low back pain, unspecified: Secondary | ICD-10-CM

## 2016-10-09 DIAGNOSIS — M5432 Sciatica, left side: Secondary | ICD-10-CM

## 2016-10-09 DIAGNOSIS — Z8541 Personal history of malignant neoplasm of cervix uteri: Secondary | ICD-10-CM

## 2016-10-09 MED ORDER — KETOROLAC TROMETHAMINE 60 MG/2ML IM SOLN
60.0000 mg | Freq: Once | INTRAMUSCULAR | Status: AC
  Administered 2016-10-09: 60 mg via INTRAMUSCULAR
  Filled 2016-10-09: qty 2

## 2016-10-09 MED ORDER — IBUPROFEN 400 MG PO TABS: 400.0000 mg | ORAL_TABLET | Freq: Three times a day (TID) | ORAL | 0 refills | Status: DC | PRN

## 2016-10-09 MED ORDER — CYCLOBENZAPRINE HCL 10 MG PO TABS: 10.0000 mg | ORAL_TABLET | Freq: Three times a day (TID) | ORAL | 0 refills | Status: DC | PRN

## 2016-10-09 MED ORDER — PREDNISONE 20 MG PO TABS: 40.0000 mg | ORAL_TABLET | Freq: Every day | ORAL | 0 refills | Status: AC

## 2016-10-09 MED ORDER — LORAZEPAM 2 MG/ML IJ SOLN
1.0000 mg | Freq: Once | INTRAMUSCULAR | Status: AC
  Administered 2016-10-09: 1 mg via INTRAVENOUS
  Filled 2016-10-09: qty 1

## 2016-10-09 MED ORDER — OXYCODONE-ACETAMINOPHEN 5-325 MG PO TABS
2.0000 | ORAL_TABLET | Freq: Once | ORAL | Status: AC
  Administered 2016-10-09: 2 via ORAL
  Filled 2016-10-09: qty 2

## 2016-10-09 MED ORDER — PREDNISONE 20 MG PO TABS
60.0000 mg | ORAL_TABLET | Freq: Once | ORAL | Status: AC
  Administered 2016-10-09: 60 mg via ORAL
  Filled 2016-10-09: qty 3

## 2016-10-09 NOTE — ED Triage Notes (Signed)
Pt to ED c/o worsening sciatica - reports it is in both sides of her lower back and radiates down through buttocks and toward calves. Pt also reports L knee pain ongoing for the past few months. Ambulatory with steady gait but reports it is painful. Full ROM. Reports tingling feeling and urinary incontinence at times over the past few months as well.

## 2016-10-09 NOTE — ED Notes (Signed)
Pt understood dc material. NAD Noted. Scripts given at dc. 

## 2016-10-09 NOTE — ED Notes (Signed)
Patient transported to MRI 

## 2016-10-09 NOTE — ED Provider Notes (Addendum)
[place] DEPT Provider Note   CSN: [id number] Arrival date & time: 10/08/16  2224 By signing my name below, I, [name], attest that this documentation has been prepared under the direction and in the presence of [name], MD . Electronically Signed: [name], Scribe. 10/09/2016. 1:55 AM.   History   Chief Complaint Chief Complaint  Patient presents with  . Sciatica  . Knee Pain   HPI Valerie Kerr is a 56 y.o. female with a history of cervical cancer, chronic pain disorder, lumbar herniated disc, who presents to the Emergency Department complaining of acute on chronic, sharp, shooting back pain with radiation to bilateral buttocks and thighs which worsened a few weeks ago. She recently established care at [facility] and was prescribed Gabapentin for her back pain which she has taken with no relief. She has also taken Tylenol with no relief of pain. She also reports persistent left knee pain onset two years ago. No history of lower lumbar surgery. Pt has no other acute complaints or associated symptoms at this time.    The history is provided by the patient. No language interpreter was used.   Past Medical History:  Diagnosis Date  . Anxiety   . 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 Chemotherapy-induced thrombocytopenia   . Chronic pain disorder    back,neck-- chronic methadone  . Environmental allergies    allergy to dust mites  . GERD (gastroesophageal reflux disease)   . Headache   . Herniated disc, cervical   . History of external beam radiation therapy 04/12/16-05/31/16   pelvis 45 Gy in 25 fractions, in  30 sessions, pelvis boost 9 Gy in 5 fractions  . History of  radiation therapy 06/20/16-07/04/16   tandem and ring applicator to cervix 28 Gy in 5 fractions  . Hypertension   . Hypokalemia   . Hypomagnesemia    po supplement and IV replacement  . Leukopenia due to antineoplastic chemotherapy ([place])   . Lumbar herniated disc   . Pancytopenia due to chemotherapy ([place])   . Periodontitis     Patient Active Problem List   Diagnosis Date Noted  . Lumbar herniated disc 09/05/2016  . Cervical radiculopathy 09/05/2016  . Neuropathy 09/05/2016  . Depression 08/15/2016  . Inguinal lymphadenopathy 06/30/2016  . Thrombocytopenia ([place]) 05/24/2016  . Dehydration 05/24/2016  . Anxiety 05/24/2016  . Nausea and vomiting 05/17/2016  . Leukopenia due to antineoplastic chemotherapy ([place]) 05/12/2016  . Hypomagnesemia 05/12/2016  . Hypokalemia 05/12/2016  . Anemia due to antineoplastic chemotherapy 05/04/2016  . Continuous tobacco abuse 04/01/2016  . Poor dentition 04/01/2016  . Essential hypertension 04/01/2016  . 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 Allergic sinusitis 04/01/2016  . Gastroesophageal reflux disease 04/01/2016  . Cervical cancer ([place]) 03/03/2016    Past Surgical History:  Procedure Laterality Date  . ABCESS DRAINAGE Left 1982   breast  . CARPAL TUNNEL RELEASE Bilateral   . CERVICAL DISC SURGERY  2010  . MULTIPLE EXTRACTIONS WITH ALVEOLOPLASTY N/A 04/08/2016   Procedure: Extraction of tooth #'s [phone] with alveoloplasty  and gross debridement of remaining teeth;  Surgeon: Lenn Cal, DDS;  Location: [place];  Service: Oral Surgery;  Laterality: N/A;  . TANDEM RING INSERTION N/A 05/26/2016   Procedure:  TANDEM RING INSERTION;  Surgeon: [name], MD;  Location: [facility];  Service: Urology;  Laterality: N/A;  . TANDEM RING INSERTION N/A 06/02/2016   Procedure: TANDEM RING INSERTION;  Surgeon: [name], MD;  Location: [facility];  Service: Urology;  Laterality: N/A;  . TANDEM RING INSERTION N/A  06/20/2016   Procedure: TANDEM RING INSERTION;  Surgeon: [name], MD;  Location: [facility];  Service: Urology;  Laterality: N/A;  . TANDEM RING INSERTION N/A 06/23/2016   Procedure: TANDEM RING INSERTION;  Surgeon: [name], MD;  Location: [facility];  Service: Urology;  Laterality: N/A;  . TANDEM RING INSERTION N/A 07/04/2016   Procedure: TANDEM RING INSERTION;  Surgeon: [name], MD;  Location: [facility];  Service: Urology;  Laterality: N/A;    OB History    No data available       Home Medications    Prior to Admission medications   Medication Sig Start Date End Date Taking? Authorizing Provider  acetaminophen (TYLENOL) 500 MG tablet Take 500 mg by mouth every 6 (six) hours as needed for mild pain.     [provider]  ALPRAZolam Duanne Moron) 0.25 MG tablet Take 1 tablet (0.25 mg total) by mouth 2 (two) times daily as needed for anxiety. 08/18/16   [name], MD  fluticasone (FLONASE) 50 MCG/ACT nasal spray Place 1 spray into both nostrils daily. 07/13/16   [name], MD  gabapentin (NEURONTIN) 300 MG capsule Take 1 capsule (300 mg total) by mouth 2 (two) times daily. 09/05/16   [name], MD  hydrocortisone 2.5 % cream Apply topically 2 (two) times daily. 09/05/16   [name], MD  HYDROmorphone (DILAUDID) 4 MG tablet Take 1 tablet (4 mg total) by mouth every 4 (four) hours as needed for severe pain. 09/08/16   [name], MD  ibuprofen (ADVIL,MOTRIN) 600 MG tablet Take 1 tablet by mouth every 6 hours for 24 hours and then as needed for moderate to severe pain. 04/08/16   Lenn Cal, DDS  lisinopril-hydrochlorothiazide (PRINZIDE) 10-12.5 MG tablet Take 1 tablet by mouth daily. 09/05/16   [name], MD  magnesium oxide (MAG-OX) 400 (241.3 Mg) MG tablet Take 1 tablet (400 mg total) by mouth daily. Patient not taking: Reported on 08/18/2016 05/30/16   Heath Lark, MD  methadone (DOLOPHINE) 10 MG/5ML  solution Take 55 mg by mouth daily.    [provider]  omeprazole (PRILOSEC) 20 MG capsule Take 1 capsule (20 mg total) by mouth daily. 09/05/16   [name], MD  Phenylephrine-APAP-Guaifenesin (SINUS CONGESTION/PAIN SEVERE PO) Take 1 capsule by mouth daily as needed (sinus pain).    [provider]  potassium chloride SA (K-DUR,KLOR-CON) 20 MEQ tablet Take 1 tablet (20 mEq total) by mouth 2 (two) times daily. Patient not taking: Reported on 08/18/2016 05/09/16   Heath Lark, MD  prochlorperazine (COMPAZINE) 10 MG tablet Take 1 tablet (10 mg total) by mouth every 6 (six) hours as needed for nausea. 04/05/16   Livesay, Tamala Julian, MD  simethicone (GAS-X) 80 MG chewable tablet 1 tablet by mouth every 6 hours as needed for burping or flatulence. 05/23/16   [name], MD    Family History Family History  Problem Relation Age of Onset  . Cancer Father   . Cancer Sister        throat  . Cancer Brother        throat and stomach  . Cancer  Brother        lung    Social History Social History  Substance Use Topics  . Smoking status: Current Every Day Smoker    Packs/day: 0.20    Years: 45.00    Types: Cigarettes  . Smokeless tobacco: Never Used     Comment: 4 cigs daily  . Alcohol use No     Allergies   Codeine; Fentanyl; and Tramadol   Review of Systems Review of Systems All systems reviewed and are negative for acute change except as noted in the HPI.  Physical Exam Updated Vital Signs BP 137/73   Pulse 85   Temp 98.8 F (37.1 C) (Oral)   Resp 18   Ht 5' 1.5" (1.562 m)   Wt 150 lb (68 kg)   SpO2 98%   BMI 27.88 kg/m   Physical Exam  Constitutional: She is oriented to person, place, and time. She appears well-developed and well-nourished. No distress.  HENT:  Head: Normocephalic and atraumatic.  Eyes: EOM are normal.  Neck: Normal range of motion.  Cardiovascular: Normal rate, regular rhythm and normal heart sounds.   Pulmonary/Chest: Effort  normal and breath sounds normal.  Abdominal: Soft. She exhibits no distension. There is no tenderness.  Musculoskeletal: Normal range of motion.  Full range of motion of joints of the lower extremities.  Normal pulses bilaterally.  No unilateral leg swelling.  Mild lumbar and paralumbar tenderness.  Neurological: She is alert and oriented to person, place, and time.  Skin: Skin is warm and dry.  Psychiatric: She has a normal mood and affect. Judgment normal.  Nursing note and vitals reviewed.    ED Treatments / Results  DIAGNOSTIC STUDIES:  Oxygen Saturation is 98% on RA, normal by my interpretation.    COORDINATION OF CARE:  1:45 AM Will order MR lumbar spine. Discussed treatment plan with pt at bedside and pt agreed to plan.   Labs (all labs ordered are listed, but only abnormal results are displayed) Labs Reviewed - No data to display  EKG  EKG Interpretation None       Radiology Mr Lumbar Spine Wo Contrast  Result Date: 10/09/2016 CLINICAL DATA:  Low back pain radiating to buttocks and LEFT knee. Occasional urinary incontinence. History of cervical cancer. EXAM: MRI LUMBAR SPINE WITHOUT CONTRAST TECHNIQUE: Multiplanar, multisequence MR imaging of the lumbar spine was performed. No intravenous contrast was administered. COMPARISON:  None. FINDINGS: SEGMENTATION: For the purposes of this report, the last well-formed intervertebral disc will be described as L5-S1. ALIGNMENT: Maintenance of the lumbar lordosis. No malalignment. VERTEBRAE:Vertebral bodies are intact. Moderate to severe L4-5 disc height loss, mild at L3-4 with mild disc desiccation. Mild chronic discogenic endplate changes H3-7. Generalized bright T1 and T2 bone marrow signal of the lower lumbar and included sacral spine consistent with post radiation change. No acute or abnormal bone marrow signal. Mild congenital canal narrowing on the basis of foreshortened pedicles. CONUS MEDULLARIS: Conus medullaris terminates at  T12-L1 and demonstrates normal morphology and signal characteristics. Cauda equina is normal. PARASPINAL AND SOFT TISSUES: Included prevertebral and paraspinal soft tissues are nonacute. Fluid distended endometrium to 10 mm, possibly obstructive given patient's history of cervical cancer. DISC LEVELS: L1-2 No disc bulge, canal stenosis nor neural foraminal narrowing. L2-3: Annular bulging eccentric laterally Common mild to moderate RIGHT, mild LEFT facet arthropathy and ligamentum flavum redundancy without canal stenosis. Mild RIGHT neural foraminal narrowing. L3-4: 3 mm broad-based disc bulge. Moderate to severe facet arthropathy and ligamentum flavum  redundancy with trace facet effusions which are likely reactive. 5 mm LEFT facet synovial cyst extending into the paraspinal soft tissues. Moderate canal stenosis. Mild to moderate bilateral neural foraminal narrowing. L4-5: 3 mm broad-based disc bulge, with superimposed RIGHT extraforaminal disc protrusion deforming the exited RIGHT L4 nerve. Mild to moderate facet arthropathy and ligamentum flavum redundancy without canal stenosis. Moderate to severe RIGHT, mild LEFT neural foraminal narrowing. L5-S1: Transitional anatomy LEFT L5-S1 pseudoarthrosis with arthropathy. No disc bulge. Mild to moderate facet arthropathy without canal stenosis or neural foraminal narrowing. IMPRESSION: 1. Postradiation changes of the spine, no pathologic fracture or MR finding of metastasis. 2. Moderate canal stenosis L3-4.  RIGHT exited L4 nerve impingement. 3. Neural foraminal narrowing L2-3 through L4-5: Moderate to severe on the RIGHT at L4-5. Electronically Signed   By: Elon Alas M.D.   On: 10/09/2016 05:45    Procedures Procedures (including critical care time)  Medications Ordered in ED Medications - No data to display   Initial Impression / Assessment and Plan / ED Course  I have reviewed the triage vital signs and the nursing notes.  Pertinent labs & imaging  results that were available during my care of the patient were reviewed by me and considered in my medical decision making (see chart for details).     Patient with moderate to severe right sided neural foraminal narrowing.  She will need outpatient neurosurgical follow-up.  No indication for acute intervention today.  Home with steroids and anti-inflammatories and muscle relaxants.  Patient understands to return to the ER for new or worsening symptoms  Final Clinical Impressions(s) / ED Diagnoses   Final diagnoses:  Low back pain  History of cervical cancer  Bilateral sciatica    New Prescriptions New Prescriptions   CYCLOBENZAPRINE (FLEXERIL) 10 MG TABLET    Take 1 tablet (10 mg total) by mouth 3 (three) times daily as needed for muscle spasms.   IBUPROFEN (ADVIL,MOTRIN) 400 MG TABLET    Take 1 tablet (400 mg total) by mouth every 8 (eight) hours as needed.   PREDNISONE (DELTASONE) 20 MG TABLET    Take 2 tablets (40 mg total) by mouth daily.   I personally performed the services described in this documentation, which was scribed in my presence. The recorded information has been reviewed and is accurate.        [name], MD 10/09/16 [id number]    [name], MD 10/25/16 [phone]

## 2016-10-10 ENCOUNTER — Telehealth: Payer: Self-pay | Admitting: Family Medicine

## 2016-10-10 ENCOUNTER — Telehealth: Payer: Self-pay | Admitting: Oncology

## 2016-10-10 DIAGNOSIS — G629 Polyneuropathy, unspecified: Secondary | ICD-10-CM

## 2016-10-10 NOTE — Telephone Encounter (Signed)
Patient called and said she is having pain in her lower right abdomen and numbness in her buttocks, left more than right.  She also said she is having occasional incontinence of stool.  She said this started about 2 months ago.  She said she went to the ER on Saturday night for back pain and now needs to see a neurologist.  She is also asking for a refill of pain medication - dilaudid 4 mg that was last filled on 09/08/16.

## 2016-10-10 NOTE — Telephone Encounter (Signed)
Pt calling to request a referral for neurology to the phone number [phone]. States that she was given this number by a MD in the ED when she was admitted. Please f/u. Thank you.

## 2016-10-10 NOTE — Telephone Encounter (Signed)
Called patient back to see how often she is taking dilaudid.  She said "only when needed" which she said is about once a day.

## 2016-10-11 ENCOUNTER — Other Ambulatory Visit: Payer: Self-pay | Admitting: Radiation Oncology

## 2016-10-11 MED ORDER — HYDROMORPHONE HCL 4 MG PO TABS
4.0000 mg | ORAL_TABLET | ORAL | 0 refills | Status: DC | PRN
Start: 2016-10-11 — End: 2016-11-01

## 2016-10-11 MED FILL — HYDROmorphone HCL 4 MG TABS: 4 | 4 days supply | Qty: 25 | Fill #0

## 2016-10-12 ENCOUNTER — Encounter: Payer: Self-pay | Admitting: Neurology

## 2016-10-17 ENCOUNTER — Encounter: Payer: Self-pay | Admitting: Family Medicine

## 2016-10-17 ENCOUNTER — Ambulatory Visit: Payer: Self-pay | Attending: Family Medicine | Admitting: Family Medicine

## 2016-10-17 VITALS — BP 158/85 | HR 91 | Temp 98.2°F | Resp 18 | Ht 61.0 in | Wt 155.8 lb

## 2016-10-17 DIAGNOSIS — Z9221 Personal history of antineoplastic chemotherapy: Secondary | ICD-10-CM | POA: Insufficient documentation

## 2016-10-17 DIAGNOSIS — Z923 Personal history of irradiation: Secondary | ICD-10-CM | POA: Insufficient documentation

## 2016-10-17 DIAGNOSIS — G629 Polyneuropathy, unspecified: Secondary | ICD-10-CM

## 2016-10-17 DIAGNOSIS — F064 Anxiety disorder due to known physiological condition: Secondary | ICD-10-CM | POA: Insufficient documentation

## 2016-10-17 DIAGNOSIS — F419 Anxiety disorder, unspecified: Secondary | ICD-10-CM

## 2016-10-17 DIAGNOSIS — Z885 Allergy status to narcotic agent status: Secondary | ICD-10-CM | POA: Insufficient documentation

## 2016-10-17 DIAGNOSIS — M5412 Radiculopathy, cervical region: Secondary | ICD-10-CM | POA: Insufficient documentation

## 2016-10-17 DIAGNOSIS — I10 Essential (primary) hypertension: Secondary | ICD-10-CM | POA: Insufficient documentation

## 2016-10-17 DIAGNOSIS — C530 Malignant neoplasm of endocervix: Secondary | ICD-10-CM

## 2016-10-17 DIAGNOSIS — K219 Gastro-esophageal reflux disease without esophagitis: Secondary | ICD-10-CM | POA: Insufficient documentation

## 2016-10-17 DIAGNOSIS — M48062 Spinal stenosis, lumbar region with neurogenic claudication: Secondary | ICD-10-CM | POA: Insufficient documentation

## 2016-10-17 DIAGNOSIS — Z599 Problem related to housing and economic circumstances, unspecified: Secondary | ICD-10-CM | POA: Insufficient documentation

## 2016-10-17 DIAGNOSIS — Z8541 Personal history of malignant neoplasm of cervix uteri: Secondary | ICD-10-CM | POA: Insufficient documentation

## 2016-10-17 MED ORDER — LISINOPRIL-HYDROCHLOROTHIAZIDE 20-25 MG PO TABS: 1.0000 | ORAL_TABLET | Freq: Every day | ORAL | 3 refills | Status: DC

## 2016-10-17 MED ORDER — GABAPENTIN 300 MG PO CAPS: 600.0000 mg | ORAL_CAPSULE | Freq: Two times a day (BID) | ORAL | 3 refills | Status: DC

## 2016-10-17 MED FILL — GABAPENTIN 300 MG CAPSULE: 300 | 30 days supply | Qty: 120 | Fill #0

## 2016-10-17 MED FILL — LISINOPRIL-HCTZ 20-25 MG TA: 20-25 | 30 days supply | Qty: 30 | Fill #0

## 2016-10-17 NOTE — Progress Notes (Signed)
Subjective:    Patient ID: Valerie Kerr, female    DOB: 09-22-1960, 56 y.o.   MRN: [id number]  HPI  She is a 56 year old female with a history of hypertension, gastroesophageal reflux disease, infiltrative squamous cell carcinoma of the cervical cancer ( status post chemoradiation which she completed in 05/2016 followed by the [place]) here for follow-up visit.  Since her last visit she has had an ED visit for bilateral sciatica and MRI of the lumbar spine revealed moderate, stenosis at L3-L4, right exiting L4 nerve impingement, foraminal narrowing at L2-L3 through L4-L5, moderate to severe on the right at L4-L5. ED notes and recommended neurosurgical referral however the patient had called the clinic requesting a neurology referral which had been placed and she has an upcoming appointment in October. Her gait is affected by her weakness and she sometimes feels like "her legs are not under her". She has severe pain radiating down both legs but is worse on the left. Also has numbness in both arms. Denies loss of sphincteric dysfunctions. She has been on gabapentin but has run out of her medications lately due to lack of finances and is not taking antihypertensive either.  She is currently stressed due to financial difficulties; also recently be relieved by the passing of her ex-husband 5 months ago and her brother 4 months ago. She is refusing to see the LCSW today as she is in a hurry to make another appointment.  Last visit to GYN oncology and radiation oncology was a month ago; posttreatment PET revealed dramatic reduction in cervical metabolic activity however there was a presence of small new hypermetabolic focus in the right inguinal region corresponding to a suspected lymph node with a maximum SUV of 6.4 which meets criteria for surveillance.    Past Medical History:  Diagnosis Date  . Anxiety   . Cervical cancer [facility]) oncologist-  dr gorsuch/ dr Sondra Come   02-20-2016 dx FIGO IIB  poor differentiated squamous cell carcinoma---  treatment concurrent chemo (week 6 on hold due to pancytopenia) and radiation therapy (external beam 04-12-2016 to 05-31-2016)  started high-dose rate brachytherapy 05-26-2016  . Chemotherapy-induced thrombocytopenia   . Chronic pain disorder    back,neck-- chronic methadone  . Environmental allergies    allergy to dust mites  . GERD (gastroesophageal reflux disease)   . Headache   . Herniated disc, cervical   . History of external beam radiation therapy 04/12/16-05/31/16   pelvis 45 Gy in 25 fractions, in  30 sessions, pelvis boost 9 Gy in 5 fractions  . History of radiation therapy 06/20/16-07/04/16   tandem and ring applicator to cervix 28 Gy in 5 fractions  . Hypertension   . Hypokalemia   . Hypomagnesemia    po supplement and IV replacement  . Leukopenia due to antineoplastic chemotherapy ([place])   . Lumbar herniated disc   . Pancytopenia due to chemotherapy ([place])   . Periodontitis     Past Surgical History:  Procedure Laterality Date  . ABCESS DRAINAGE Left 1982   breast  . CARPAL TUNNEL RELEASE Bilateral   . CERVICAL DISC SURGERY  2010  . MULTIPLE EXTRACTIONS WITH ALVEOLOPLASTY N/A 04/08/2016   Procedure: Extraction of tooth #'s [id number] with alveoloplasty  and gross debridement of remaining teeth;  Surgeon: Lenn Cal, DDS;  Location: [place];  Service: Oral Surgery;  Laterality: N/A;  . TANDEM RING INSERTION N/A 05/26/2016   Procedure: TANDEM RING INSERTION;  Surgeon: [name], MD;  Location: [facility];  Service: Urology;  Laterality: N/A;  . TANDEM RING INSERTION N/A 06/02/2016   Procedure: TANDEM RING INSERTION;  Surgeon: [name], MD;  Location[facility];  Service: Urology;  Laterality: N/A;  . TANDEM RING INSERTION N/A 06/20/2016   Procedure: TANDEM RING INSERTION;  Surgeon: [name], MD;  [facility];  Service: Urology;  Laterality: N/A;  . TANDEM  RING INSERTION N/A 06/23/2016   Procedure: TANDEM RING INSERTION;  Surgeon: [name], MD;  Location: [facility];  Service: Urology;  Laterality: N/A;  . TANDEM RING INSERTION N/A 07/04/2016   Procedure: TANDEM RING INSERTION;  Surgeon: [name], MD;  Location: [facility];  Service: Urology;  Laterality: N/A;    Allergies  Allergen Reactions  . Codeine Itching and Rash  . Fentanyl Rash    Skin rash, local swelling from patch  . Tramadol Other (See Comments)    GI upset,also trembling sensation     Review of Systems Constitutional: Negative for activity change, appetite change and fatigue.  HENT: Negative for congestion, sinus pressure and sore throat.   Eyes: Negative for visual disturbance.  Respiratory: Negative for cough, chest tightness, shortness of breath and wheezing.   Cardiovascular: Negative for chest pain and palpitations.  Gastrointestinal: Negative for abdominal distention, abdominal pain and constipation.  Endocrine: Negative for polydipsia.  Genitourinary: Negative for dysuria and frequency.  Musculoskeletal:       See hpi  Skin: Negative for rash.  Neurological: Positive for numbness. Negative for tremors and light-headedness.  Hematological: Does not bruise/bleed easily.  Psychiatric/Behavioral: Negative for agitation and behavioral problems.     Objective: Vitals:   10/17/16 1404  BP: (!) 158/85  Pulse: 91  Resp: 18  Temp: 98.2 F (36.8 C)  TempSrc: Oral  SpO2: 96%  Weight: 155 lb 12.8 oz (70.7 kg)  Height: 5\' 1"  (1.549 m)      Physical Exam  Constitutional: She is oriented to person, place, and time. She appears well-developed and well-nourished.  Cardiovascular: Normal rate, normal heart sounds and intact distal pulses.   No murmur heard. Pulmonary/Chest: Effort normal and breath sounds normal. She has no wheezes. She has no rales. She exhibits no tenderness.  Abdominal: Soft. Bowel sounds are normal. She  exhibits no distension and no mass. There is no tenderness.  Musculoskeletal: She exhibits tenderness (light tenderness on palpation of the lumbar spine; negative straight leg raise).  Neurological: She is alert and oriented to person, place, and time.  Skin: Normal  Psychiatric: She has a normal mood and affect.   CLINICAL DATA:  Low back pain radiating to buttocks and LEFT knee. Occasional urinary incontinence. History of cervical cancer.  EXAM: MRI LUMBAR SPINE WITHOUT CONTRAST  TECHNIQUE: Multiplanar, multisequence MR imaging of the lumbar spine was performed. No intravenous contrast was administered.  COMPARISON:  None.  FINDINGS: SEGMENTATION: For the purposes of this report, the last well-formed intervertebral disc will be described as L5-S1.  ALIGNMENT: Maintenance of the lumbar lordosis. No malalignment.  VERTEBRAE:Vertebral bodies are intact. Moderate to severe L4-5 disc height loss, mild at L3-4 with mild disc desiccation. Mild chronic discogenic endplate changes T2-4. Generalized bright T1 and T2 bone marrow signal of the lower lumbar and included sacral spine consistent with post radiation change. No acute or abnormal bone marrow signal. Mild congenital canal narrowing on the basis of foreshortened pedicles.  CONUS MEDULLARIS: Conus medullaris terminates at T12-L1 and demonstrates normal morphology and signal characteristics.  Cauda equina is normal.  PARASPINAL AND SOFT TISSUES: Included prevertebral and paraspinal soft tissues are nonacute. Fluid distended endometrium to 10 mm, possibly obstructive given patient's history of cervical cancer.  DISC LEVELS:  L1-2 No disc bulge, canal stenosis nor neural foraminal narrowing.  L2-3: Annular bulging eccentric laterally Common mild to moderate RIGHT, mild LEFT facet arthropathy and ligamentum flavum redundancy without canal stenosis. Mild RIGHT neural foraminal narrowing.  L3-4: 3 mm broad-based  disc bulge. Moderate to severe facet arthropathy and ligamentum flavum redundancy with trace facet effusions which are likely reactive. 5 mm LEFT facet synovial cyst extending into the paraspinal soft tissues. Moderate canal stenosis. Mild to moderate bilateral neural foraminal narrowing.  L4-5: 3 mm broad-based disc bulge, with superimposed RIGHT extraforaminal disc protrusion deforming the exited RIGHT L4 nerve. Mild to moderate facet arthropathy and ligamentum flavum redundancy without canal stenosis. Moderate to severe RIGHT, mild LEFT neural foraminal narrowing.  L5-S1: Transitional anatomy LEFT L5-S1 pseudoarthrosis with arthropathy. No disc bulge. Mild to moderate facet arthropathy without canal stenosis or neural foraminal narrowing.  IMPRESSION: 1. Postradiation changes of the spine, no pathologic fracture or MR finding of metastasis. 2. Moderate canal stenosis L3-4.  RIGHT exited L4 nerve impingement. 3. Neural foraminal narrowing L2-3 through L4-5: Moderate to severe on the RIGHT at L4-5.   Electronically Signed   By: Elon Alas M.D.   On: 10/09/2016 05:45    Assessment & Plan:  1. Malignant neoplasm of cervix, unspecified site [facility]) Status post radiation  2. Hypertension Uncontrolled Increased dose of lisinopril/HCTZ Low-sodium diet  3. Gastroesophageal reflux disease, esophagitis presence not specified Stable - omeprazole (PRILOSEC) 20 MG capsule; Take 1 capsule (20 mg total) by mouth daily.  Dispense: 30 capsule; Refill: 3  4. Spinal stenosis of lumbar region with neurogenic claudication  Uncontrolled Increased dose of gabapentin Would love to add Cymbalta however she declines She also has Dilaudid which she receives from the [place] Referred to neurosurgery   5. Cervical radiculopathy S/p previous cervical surgery   6. Anxiety Due to underlying medical conditions, financial stresses, bereavement Refuses initiation of  SSRI Scheduled with LCSW for counseling   This note has been created with Surveyor, quantity. Any transcriptional errors are unintentional.

## 2016-10-17 NOTE — Progress Notes (Signed)
Patient has not eaten Patient has taken her medications

## 2016-10-19 ENCOUNTER — Institutional Professional Consult (permissible substitution): Payer: Self-pay | Admitting: Licensed Clinical Social Worker

## 2016-10-19 ENCOUNTER — Ambulatory Visit: Payer: Self-pay

## 2016-10-20 ENCOUNTER — Encounter (HOSPITAL_COMMUNITY)
Admission: RE | Admit: 2016-10-20 | Discharge: 2016-10-20 | Disposition: A | Discharge status: Home / Self Care | Payer: Self-pay | Source: Ambulatory Visit | Attending: Gynecologic Oncology | Admitting: Gynecologic Oncology

## 2016-10-20 DIAGNOSIS — C531 Malignant neoplasm of exocervix: Secondary | ICD-10-CM | POA: Insufficient documentation

## 2016-10-20 LAB — GLUCOSE, CAPILLARY: GLUCOSE-CAPILLARY: 88 mg/dL (ref 65–99)

## 2016-10-20 MED ORDER — FLUDEOXYGLUCOSE F - 18 (FDG) INJECTION
7.7000 | Freq: Once | INTRAVENOUS | Status: AC | PRN
  Administered 2016-10-20: 7.7 via INTRAVENOUS

## 2016-10-25 ENCOUNTER — Ambulatory Visit: Payer: Medicaid Other | Attending: Family Medicine | Admitting: *Deleted

## 2016-10-25 VITALS — BP 140/86 | HR 80 | Resp 16

## 2016-10-25 DIAGNOSIS — I10 Essential (primary) hypertension: Secondary | ICD-10-CM | POA: Diagnosis not present

## 2016-10-25 DIAGNOSIS — Z0130 Encounter for examination of blood pressure without abnormal findings: Secondary | ICD-10-CM

## 2016-10-25 NOTE — Progress Notes (Signed)
Pt request BP check. She states she feels bad. Pt c/o pain in neck, back and hand. She states she has chronic pain and medication helps slightly.   Pt arrived to [facility], alert and oriented. Last OV 10/17/2016 with Dr. Jarold Song.  Pt denies chest pain, SOB, HA, dizziness, or blurred vision.  Verified medication with patient.  Pt states medication was taken this morning.  Manual blood pressure reading: 140/86

## 2016-10-28 ENCOUNTER — Telehealth: Payer: Self-pay | Admitting: Oncology

## 2016-10-28 NOTE — Telephone Encounter (Signed)
Patient called and asked for a refill on dilaudid that was last filled on 10/11/16.  She said she takes 1-2 tablets a day and is also taking Ibuprofen.  She also said she is having numbness in her buttocks and that she is having occasional incontinence of stool.  She is also feeling weak.  Advised her that Dr. Sondra Come will be notified and that we will call her back on Monday.

## 2016-11-01 ENCOUNTER — Telehealth: Payer: Self-pay | Admitting: Oncology

## 2016-11-01 ENCOUNTER — Telehealth: Payer: Self-pay | Admitting: Gynecologic Oncology

## 2016-11-01 ENCOUNTER — Other Ambulatory Visit: Payer: Self-pay | Admitting: Radiation Oncology

## 2016-11-01 DIAGNOSIS — C530 Malignant neoplasm of endocervix: Secondary | ICD-10-CM

## 2016-11-01 MED ORDER — HYDROMORPHONE HCL 4 MG PO TABS: 4.0000 mg | ORAL_TABLET | ORAL | 0 refills | Status: DC | PRN

## 2016-11-01 NOTE — Telephone Encounter (Signed)
Patient informed of PET scan results. Advised to follow up as scheduled.

## 2016-11-01 NOTE — Telephone Encounter (Addendum)
Patient called back and was advised the refill for her pain medication is ready to be picked up in the Radiation Nursing area.  Also advised patient that Dr. Sondra Come would like her to see her PCP as soon as possible to try to move up her neurologist appointment as the spinal stenosis seen on her recent MRI could be causing her fecal incontinence.  Valerie Kerr said she is not able to see the neurologist sooner because they require her to pay $200.00 for the visit.  Advised her that I will check with our financial advocates to see if there is anything that we can help with.

## 2016-11-01 NOTE — Telephone Encounter (Signed)
Called patient and left a message for a return call.

## 2016-11-02 ENCOUNTER — Telehealth: Payer: Self-pay | Admitting: Family Medicine

## 2016-11-02 NOTE — Telephone Encounter (Signed)
Patient called has right leg pain, and also on back side pain. And cant sleep during the night.  Stated also has tingling and numbness on her arms. Doesn't think needs to go to ER right now, Concerned because has a referral for neuro but has no insurance.  Would like to speak to nurse or Dr.  Please follow up [phone]

## 2016-11-03 MED FILL — HYDROmorphone HCL 4 MG TABS: 4 | 4 days supply | Qty: 25 | Fill #0

## 2016-11-03 NOTE — Telephone Encounter (Signed)
Will route to nurse °

## 2016-11-03 NOTE — Telephone Encounter (Signed)
Tried contacting pt. Pt didn't answer lvm asking pt to give me a call back at her earliest convenience

## 2016-11-04 NOTE — Telephone Encounter (Signed)
Contacted pt to inform her of Dr. Jarold Song response pt states she understands and she doesn't have any questions or concerns

## 2016-11-04 NOTE — Telephone Encounter (Signed)
Pt states she almost went to the ED the other night because of the numbness she was having in her right leg to toes. Pt states she felt a knot pressing in back. Pt also states she has to sleep with two hand braces on at night and its difficult for her to sleep. Did offer pt an appointment with Levada Dy for Tuesday. Pt states she would have to get back to me. I informed pt that it was fine, but if she feels that the tingling and numbness is getting worse she will need to go to the ED. Pt states she understands.   As far as the referral to neuro pt states she would have to pay $200 out of pocket because she doesn't have insurance. Pt states she is waiting to hear from medicaid.

## 2016-11-04 NOTE — Telephone Encounter (Signed)
Continue her increased dose of gabapentin but she will ultimately need to see a specialist for further evaluation. Can present to the ED if symptoms persist.

## 2016-11-14 ENCOUNTER — Ambulatory Visit: Payer: Self-pay | Admitting: Gynecologic Oncology

## 2016-11-16 ENCOUNTER — Telehealth: Payer: Self-pay | Admitting: *Deleted

## 2016-11-16 NOTE — Telephone Encounter (Signed)
Returned the patient's call and rescheduled her missed appt from Monday August 20th. Appt rescheduled to September 7th at 3:15pm, arrive at 3pm

## 2016-11-17 NOTE — Addendum Note (Signed)
Addendum  created 11/17/16 1148 by Roberts Gaudy, MD   Sign clinical note

## 2016-11-23 ENCOUNTER — Telehealth: Payer: Self-pay | Admitting: Oncology

## 2016-11-23 ENCOUNTER — Other Ambulatory Visit: Payer: Self-pay | Admitting: Radiation Oncology

## 2016-11-23 DIAGNOSIS — C530 Malignant neoplasm of endocervix: Secondary | ICD-10-CM

## 2016-11-23 MED ORDER — HYDROMORPHONE HCL 4 MG PO TABS: 4.0000 mg | ORAL_TABLET | ORAL | 0 refills | Status: DC | PRN

## 2016-11-23 MED FILL — LISINOPRIL-HCTZ 20-25 MG TA: 20-25 | 30 days supply | Qty: 30 | Fill #1

## 2016-11-23 MED FILL — GABAPENTIN 300 MG CAPSULE: 300 | 30 days supply | Qty: 120 | Fill #1

## 2016-11-23 NOTE — Telephone Encounter (Signed)
Patient called and requested a refill on pain medication (Dilaudid 4 mg tablets).  She last had it filled on 11/01/16.

## 2016-11-23 NOTE — Telephone Encounter (Addendum)
Called patient and let her know that the refill for dilaudid is available for pickup. She said she is still waiting for disability and Medicaid coverage and has not been able to see a neurosurgeon.  She is wondering if Dr. Sondra Come can write a letter saying that she needs an emergency referral to a neurosurgeon.

## 2016-11-24 ENCOUNTER — Encounter: Payer: Self-pay | Admitting: Oncology

## 2016-11-24 MED FILL — HYDROmorphone HCL 4 MG TABS: 4 | 4 days supply | Qty: 25 | Fill #0

## 2016-12-02 ENCOUNTER — Ambulatory Visit: Payer: Medicaid Other | Attending: Gynecologic Oncology | Admitting: Gynecologic Oncology

## 2016-12-02 ENCOUNTER — Encounter: Payer: Self-pay | Admitting: Gynecologic Oncology

## 2016-12-02 VITALS — BP 121/59 | HR 88 | Temp 98.0°F | Resp 20 | Wt 154.6 lb

## 2016-12-02 DIAGNOSIS — D6959 Other secondary thrombocytopenia: Secondary | ICD-10-CM | POA: Insufficient documentation

## 2016-12-02 DIAGNOSIS — Z885 Allergy status to narcotic agent status: Secondary | ICD-10-CM | POA: Insufficient documentation

## 2016-12-02 DIAGNOSIS — G8929 Other chronic pain: Secondary | ICD-10-CM | POA: Diagnosis not present

## 2016-12-02 DIAGNOSIS — R5383 Other fatigue: Secondary | ICD-10-CM | POA: Insufficient documentation

## 2016-12-02 DIAGNOSIS — Z923 Personal history of irradiation: Secondary | ICD-10-CM | POA: Diagnosis not present

## 2016-12-02 DIAGNOSIS — C531 Malignant neoplasm of exocervix: Secondary | ICD-10-CM

## 2016-12-02 DIAGNOSIS — F17210 Nicotine dependence, cigarettes, uncomplicated: Secondary | ICD-10-CM | POA: Insufficient documentation

## 2016-12-02 DIAGNOSIS — Z791 Long term (current) use of non-steroidal anti-inflammatories (NSAID): Secondary | ICD-10-CM | POA: Diagnosis not present

## 2016-12-02 DIAGNOSIS — Z79899 Other long term (current) drug therapy: Secondary | ICD-10-CM | POA: Insufficient documentation

## 2016-12-02 DIAGNOSIS — Z9221 Personal history of antineoplastic chemotherapy: Secondary | ICD-10-CM

## 2016-12-02 DIAGNOSIS — N950 Postmenopausal bleeding: Secondary | ICD-10-CM | POA: Insufficient documentation

## 2016-12-02 DIAGNOSIS — Z888 Allergy status to other drugs, medicaments and biological substances status: Secondary | ICD-10-CM | POA: Insufficient documentation

## 2016-12-02 DIAGNOSIS — M549 Dorsalgia, unspecified: Secondary | ICD-10-CM | POA: Diagnosis not present

## 2016-12-02 DIAGNOSIS — R531 Weakness: Secondary | ICD-10-CM | POA: Diagnosis not present

## 2016-12-02 DIAGNOSIS — E876 Hypokalemia: Secondary | ICD-10-CM | POA: Diagnosis not present

## 2016-12-02 DIAGNOSIS — Z8541 Personal history of malignant neoplasm of cervix uteri: Secondary | ICD-10-CM

## 2016-12-02 DIAGNOSIS — C539 Malignant neoplasm of cervix uteri, unspecified: Secondary | ICD-10-CM | POA: Insufficient documentation

## 2016-12-02 DIAGNOSIS — F419 Anxiety disorder, unspecified: Secondary | ICD-10-CM | POA: Diagnosis not present

## 2016-12-02 DIAGNOSIS — K219 Gastro-esophageal reflux disease without esophagitis: Secondary | ICD-10-CM | POA: Diagnosis not present

## 2016-12-02 DIAGNOSIS — Z801 Family history of malignant neoplasm of trachea, bronchus and lung: Secondary | ICD-10-CM | POA: Insufficient documentation

## 2016-12-02 DIAGNOSIS — I10 Essential (primary) hypertension: Secondary | ICD-10-CM | POA: Insufficient documentation

## 2016-12-02 DIAGNOSIS — Z800 Family history of malignant neoplasm of digestive organs: Secondary | ICD-10-CM | POA: Insufficient documentation

## 2016-12-02 DIAGNOSIS — D61818 Other pancytopenia: Secondary | ICD-10-CM | POA: Diagnosis not present

## 2016-12-02 NOTE — Patient Instructions (Signed)
Please notify Dr [name] at phone number [phone] if you notice vaginal bleeding, new pelvic or abdominal pains, bloating, feeling full easy, or a change in bladder or bowel function.   Please call Dr Serita Grit office in October, 2018 to schedule a follow-up appointment for January, 2019.

## 2016-12-02 NOTE — Progress Notes (Signed)
Consult Note: Gyn-Onc  Consult was requested by Dr. [name] for the evaluation of Valerie Kerr 56 y.o. female  CC:  Chief Complaint  Patient presents with  . Cervical Cancer    follow-up    Assessment/Plan:  [name]  is a 56 y.o.  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  Post treatment PET showed excellent response at cervix. No evidence of persistent disease on today's exam.  I will see her back in January, 2019 (she will see Dr Sondra Come in October, 2018) to follow-up the cervical cancer surveillance. I discussed that I cannot authorize disability because I am not treating her for a condition that is disabling at present. She will need to request this from her physicians who treat her for her fatigue and back pain.  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  The patient reports last having a pap smear approximately 4 years ago in [place] which, was followed by a biopsy which, per patient, was "normal" and she was not informed that she would require special followup. She began experiencing postmenopausal bleeding at the beginning of 2016 and continued to bleed intermittently for 2 years.   She presented to the ER in [place] in November (25th), 2017 and a cervical mass was identified on pelvic exam.  She was seen in the office by Dr Baron Sane 03/01/16 who performed cervical biopsies of a friable mass which revealed poorly differentiated squamous cell carcinoma. Attempts were made to notify her of this result on 03/03/16, however a phone call was left to call back and there appears to be delay in her receiving the information about her result.  She presented to the office to be seen by Dr [name] for discussion  regarding results on 03/16/16 and was informed of the results and the need to see oncology.   A PET had been ordered however, the patient cancelled this due to claustrophobia and fear of having a scan and getting bad results.  Interval Hx:   On 04/04/16 she underwent pretreatment PET which showed 5.5 cm hypermetabolic cervical mass, consistent with known primary cervical carcinoma. No definite local or distant metastatic disease.Marland Kitchen  She was treated with primary radiation therapy and radiosensitizing chemotherapy with cisplatin between 04/12/16 and 06/20/16 with 45Gy to the pelvis with 9Gy boost, and 28 Gy additionally to the cervix with intracavitary brachytherapy.  Post treatment imaging on 06/29/16 showed excellent response at cervix but a new slightly enlarged PET avid right inguinal node was present.  Follow-up repeat PET on 10/20/16 showed complete resolution of PET avid findings at the cervix and inguinal node with no apparent malignant disease present.  Since completing therapy she reports profound weakness and fatigue, though this was present before her treatments and her lab work and exams do not show a clear organic explanation for these symptoms. It may be secondary to depression. She is tearful and depressed in her mood. She has severe back pain. She cannot work due to her symptoms.  Current Meds:  Outpatient Encounter Prescriptions as of 12/02/2016  Medication Sig  . ALPRAZolam (XANAX) 0.25 MG tablet Take 1 tablet (0.25 mg total) by mouth 2 (two) times daily as needed for anxiety.  . cyclobenzaprine (FLEXERIL) 10 MG tablet Take 1 tablet (10 mg total)  by mouth 3 (three) times daily as needed for muscle spasms.  . fluticasone (FLONASE) 50 MCG/ACT nasal spray Place 1 spray into both nostrils daily.  Marland Kitchen gabapentin (NEURONTIN) 300 MG capsule Take 2 capsules (600 mg total) by mouth 2 (two) times daily.  Marland Kitchen HYDROmorphone (DILAUDID) 4 MG tablet Take 1 tablet (4 mg total) by mouth every 4 (four) hours  as needed for severe pain.  Marland Kitchen ibuprofen (ADVIL,MOTRIN) 400 MG tablet Take 1 tablet (400 mg total) by mouth every 8 (eight) hours as needed.  Marland Kitchen lisinopril-hydrochlorothiazide (PRINZIDE,ZESTORETIC) 20-25 MG tablet Take 1 tablet by mouth daily.  . methadone (DOLOPHINE) 10 MG/5ML solution Take 55 mg by mouth daily.  Marland Kitchen omeprazole (PRILOSEC) 20 MG capsule Take 1 capsule (20 mg total) by mouth daily.  . prochlorperazine (COMPAZINE) 10 MG tablet Take 1 tablet (10 mg total) by mouth every 6 (six) hours as needed for nausea.  . simethicone (GAS-X) 80 MG chewable tablet 1 tablet by mouth every 6 hours as needed for burping or flatulence.   No facility-administered encounter medications on file as of 12/02/2016.     Allergy:  Allergies  Allergen Reactions  . Codeine Itching and Rash  . Fentanyl Rash    Skin rash, local swelling from patch  . Tramadol Other (See Comments)    GI upset,also trembling sensation    Social Hx:   Social History   Social History  . Marital status: Divorced    Spouse name: N/A  . Number of children: 5  . Years of education: N/A   Occupational History  . Not on file.   Social History Main Topics  . Smoking status: Current Every Day Smoker    Packs/day: 0.20    Years: 45.00    Types: Cigarettes  . Smokeless tobacco: Never Used     Comment: 4 cigs daily  . Alcohol use No  . Drug use: No  . Sexual activity: Not Currently   Other Topics Concern  . Not on file   Social History Narrative  . No narrative on file    Past Surgical Hx:  Past Surgical History:  Procedure Laterality Date  . ABCESS DRAINAGE Left 1982   breast  . CARPAL TUNNEL RELEASE Bilateral   . CERVICAL DISC SURGERY  2010  . MULTIPLE EXTRACTIONS WITH ALVEOLOPLASTY N/A 04/08/2016   Procedure: Extraction of tooth #'s [phone] with alveoloplasty  and gross debridement of remaining teeth;  Surgeon: Lenn Cal, DDS;  Location: [place];  Service: Oral Surgery;  Laterality: N/A;  . TANDEM  RING INSERTION N/A 05/26/2016   Procedure: TANDEM RING INSERTION;  Surgeon: [name], MD;  Location: [facility];  Service: Urology;  Laterality: N/A;  . TANDEM RING INSERTION N/A 06/02/2016   Procedure: TANDEM RING INSERTION;  Surgeon: [name], MD;  Location: [facility];  Service: Urology;  Laterality: N/A;  . TANDEM RING INSERTION N/A 06/20/2016   Procedure: TANDEM RING INSERTION;  Surgeon: [name], MD;  Location: [facility];  Service: Urology;  Laterality: N/A;  . TANDEM RING INSERTION N/A 06/23/2016   Procedure: TANDEM RING INSERTION;  Surgeon: [name], MD;  Location: [facility];  Service: Urology;  Laterality: N/A;  . TANDEM RING INSERTION N/A 07/04/2016   Procedure: TANDEM RING INSERTION;  Surgeon: [name], MD;  Location: [facility];  Service: Urology;  Laterality: N/A;    Past Medical Hx:  Past Medical History:  Diagnosis Date  .  Anxiety   . Cervical cancer [facility]) oncologist-  dr gorsuch/ dr Sondra Come   02-20-2016 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---  treatment concurrent chemo (week 6 on hold due to pancytopenia) and radiation therapy (external beam 04-12-2016 to 05-31-2016)  started high-dose rate brachytherapy 05-26-2016  . Chemotherapy-induced thrombocytopenia   . Chronic pain disorder    back,neck-- chronic methadone  . Environmental allergies    allergy to dust mites  . GERD (gastroesophageal reflux disease)   . Headache   . Herniated disc, cervical   . History of external beam radiation therapy 04/12/16-05/31/16   pelvis 45 Gy in 25 fractions, in  30 sessions, pelvis boost 9 Gy in 5 fractions  . History of radiation therapy 06/20/16-07/04/16   tandem and ring applicator to cervix 28 Gy in 5 fractions  . Hypertension   . Hypokalemia   . Hypomagnesemia    po supplement and IV replacement  . Leukopenia due to antineoplastic chemotherapy ([place])   . Lumbar  herniated disc   . Pancytopenia due to chemotherapy ([place])   . Periodontitis     Past Gynecological History:  Denies history of abnormal paps. Last pap was 4 years ago in [place] per patient. SVD x 5 No LMP recorded. Patient is postmenopausal.  Family Hx:  Family History  Problem Relation Age of Onset  . Cancer Father   . Cancer Sister        throat  . Cancer Brother        throat and stomach  . Cancer Brother        lung    Review of Systems:  Constitutional  Feels fatigue, sadness, generalized weakness   ENT Normal appearing ears and nares bilaterally Skin/Breast  No rash, sores, jaundice, itching, dryness Cardiovascular  No chest pain, shortness of breath, or edema  Pulmonary  No cough or wheeze.  Gastro Intestinal  No nausea, vomitting, or diarrhoea. No bright red blood per rectum, no abdominal pain, change in bowel movement, + constipation.  Genito Urinary  No frequency, urgency, dysuria, . Musculo Skeletal  + myalgia, arthralgia, pain Neurologic  No weakness, numbness, change in gait,  Psychology  No depression, anxiety, insomnia.   Vitals:  Blood pressure (!) 121/59, pulse 88, temperature 98 F (36.7 C), temperature source Oral, resp. rate 20, weight 70.1 kg (154 lb 9.6 oz), SpO2 99 %.  Physical Exam: WD in NAD Neck  Supple NROM, without any enlargements.  Lymph Node Survey No cervical supraclavicular or inguinal adenopathy Cardiovascular  Pulse normal rate, regularity and rhythm. S1 and S2 normal.  Lungs  Clear to auscultation bilateraly, without wheezes/crackles/rhonchi. Good air movement.  Skin  No rash/lesions/breakdown  Psychiatry  Alert and oriented to person, place, and time  Abdomen  Normoactive bowel sounds, abdomen soft, non-tender and nonobese without evidence of hernia.  Back No CVA tenderness Genito Urinary  Vulva/vagina: Normal external female genitalia.  No lesions. No discharge or bleeding.  Bladder/urethra:  No lesions or  masses, well supported bladder  Vagina: grossly normal  Cervix: The cervix is grossly normal. No visible or palpable residual lesion  Uterus:  Small, mobile,  Adnexa: no masses. Rectal  No lesions or masses Extremities  No bilateral cyanosis, clubbing or edema.   Donaciano Eva, MD  12/02/2016, 4:13 PM

## 2016-12-16 ENCOUNTER — Telehealth: Payer: Self-pay | Admitting: Oncology

## 2016-12-16 NOTE — Telephone Encounter (Signed)
Patient called and requested a refill on dilaudid, xanax and flonase.  She last had the dilaudid filled on 11/23/16.

## 2016-12-20 ENCOUNTER — Other Ambulatory Visit: Payer: Self-pay | Admitting: Radiation Oncology

## 2016-12-20 DIAGNOSIS — F418 Other specified anxiety disorders: Secondary | ICD-10-CM

## 2016-12-20 DIAGNOSIS — C530 Malignant neoplasm of endocervix: Secondary | ICD-10-CM

## 2016-12-20 MED ORDER — HYDROMORPHONE HCL 4 MG PO TABS: 4.0000 mg | ORAL_TABLET | ORAL | 0 refills | Status: DC | PRN

## 2016-12-20 MED ORDER — ALPRAZOLAM 0.25 MG PO TABS: 0.2500 mg | ORAL_TABLET | Freq: Two times a day (BID) | ORAL | 0 refills | Status: DC | PRN

## 2016-12-20 MED ORDER — FLUTICASONE PROPIONATE 50 MCG/ACT NA SUSP: 1.0000 | Freq: Every day | NASAL | 1 refills | Status: DC

## 2016-12-22 ENCOUNTER — Telehealth: Payer: Self-pay | Admitting: Oncology

## 2016-12-22 MED FILL — HYDROmorphone HCL 4 MG TABS: 4 | 4 days supply | Qty: 25 | Fill #0

## 2016-12-22 MED FILL — FLUTICASONE PROP 50 MCG SPR: 50 | 30 days supply | Qty: 16 | Fill #1

## 2016-12-22 MED FILL — ALPRAZolam 0.25 MG TABS: 0.25 | 15 days supply | Qty: 30 | Fill #0

## 2016-12-22 NOTE — Telephone Encounter (Signed)
Called in flonase refill to [facility] per patient request.

## 2016-12-26 ENCOUNTER — Ambulatory Visit
Admission: RE | Admit: 2016-12-26 | Discharge: 2016-12-26 | Disposition: A | Discharge status: Home / Self Care | Payer: Medicaid Other | Source: Ambulatory Visit | Attending: Radiation Oncology | Admitting: Radiation Oncology

## 2016-12-26 ENCOUNTER — Encounter: Payer: Self-pay | Admitting: Radiation Oncology

## 2016-12-26 DIAGNOSIS — F418 Other specified anxiety disorders: Secondary | ICD-10-CM | POA: Diagnosis not present

## 2016-12-26 DIAGNOSIS — C530 Malignant neoplasm of endocervix: Secondary | ICD-10-CM | POA: Diagnosis not present

## 2016-12-26 DIAGNOSIS — C531 Malignant neoplasm of exocervix: Secondary | ICD-10-CM

## 2016-12-26 DIAGNOSIS — R350 Frequency of micturition: Secondary | ICD-10-CM | POA: Insufficient documentation

## 2016-12-26 MED ORDER — PROCHLORPERAZINE MALEATE 10 MG PO TABS: 10.0000 mg | ORAL_TABLET | Freq: Four times a day (QID) | ORAL | 0 refills | Status: DC | PRN

## 2016-12-26 MED FILL — PROCHLORPERAZINE 10 MG TAB: 10 | 5 days supply | Qty: 20 | Fill #0

## 2016-12-26 NOTE — Progress Notes (Signed)
Valerie Kerr is here for follow up.  She reports having pain in her lower back at a 5/10.  She has an appointment with Neurology on 10/22 as her disability has been approved.  She is taking dilaudid 1-2 times a day alternating with tylenol and ibuprofen.  She reports having urinary frequency.  She reports having numbness in her buttocks and said she can't always feel when she has a bowel movement.  She reports having smaller stools.  She denies having any vaginal bleeding or discharge.  She reports having occasional nausea and is requesting a refill on Compazine.  She is not using a vaginal dilator.  She reports having fatigue.  BP 133/73 (BP Location: Left Arm, Patient Position: Sitting)   Pulse 72   Temp 97.8 F (36.6 C) (Oral)   Ht 5\' 1"  (1.549 m)   Wt 158 lb 6.4 oz (71.8 kg)   SpO2 97%   BMI 29.93 kg/m    Wt Readings from Last 3 Encounters:  12/26/16 158 lb 6.4 oz (71.8 kg)  12/02/16 154 lb 9.6 oz (70.1 kg)  10/17/16 155 lb 12.8 oz (70.7 kg)

## 2016-12-26 NOTE — Progress Notes (Signed)
Radiation Oncology         [phone] ________________________________  Name: Valerie Kerr MRN: [id number]  Date: 12/26/2016  DOB: [birth year]  Follow-Up Visit Note  CC: [name], MD  [name], MD    ICD-10-CM   1. Malignant neoplasm of endocervix [facility]) C53.0     Diagnosis:   Stage IIB poorly differentiated squamous cell carcinoma of the cervix   Interval Since Last Radiation:  6 months   Indication for treatment:  Curative, along with radiosensitizing chemotherapy       Radiation treatment dates:  04/12/16-05/31/16                                                 06/20/16-07/04/16  Site/dose:  1) Pelvis/ 45 Gy in 25 fractions                         2) Pelvis Boost/ 9 Gy in 5 fractions                         3) Cervix / 28 Gy in 5 fractions  Beams/energy:  1) 3D / 15X                                     2) Isodose plan/ 15X                                     3) HDR Ir-192 Cervix / Iridium HDR, tandem/ring applicator  Narrative:  The patient returns today for routine follow-up. She reports having pain in her lower back at a 5/10.  She has an appointment with Neurology on 10/22 as her disability has been approved.  She is taking dilaudid 1-2 times a day alternating with tylenol and ibuprofen.  She reports having urinary frequency.  She reports having numbness in her buttocks and said she can't always feel when she has a bowel movement.  She reports having smaller stools.  She denies having any vaginal bleeding or discharge.  She reports having occasional nausea and is requesting a refill on Compazine.  She is not using a vaginal dilator.  She reports having fatigue which has been a chronic problem for her, likely related to stress/anxiety/depression.                      ALLERGIES:  is allergic to codeine; fentanyl; and tramadol.  Meds: Current Outpatient Prescriptions  Medication Sig Dispense Refill  . ALPRAZolam (XANAX) 0.25 MG tablet Take 1 tablet (0.25 mg  total) by mouth 2 (two) times daily as needed for anxiety. 30 tablet 0  . fluticasone (FLONASE) 50 MCG/ACT nasal spray Place 1 spray into both nostrils daily. 16 g 1  . gabapentin (NEURONTIN) 300 MG capsule Take 2 capsules (600 mg total) by mouth 2 (two) times daily. 120 capsule 3  . HYDROmorphone (DILAUDID) 4 MG tablet Take 1 tablet (4 mg total) by mouth every 4 (four) hours as needed for severe pain. 25 tablet 0  . ibuprofen (ADVIL,MOTRIN) 400 MG tablet Take 1 tablet (400 mg total) by mouth every 8 (eight) hours as needed. 15 tablet  0  . lisinopril-hydrochlorothiazide (PRINZIDE,ZESTORETIC) 20-25 MG tablet Take 1 tablet by mouth daily. 30 tablet 3  . methadone (DOLOPHINE) 10 MG/5ML solution Take 55 mg by mouth daily.    Marland Kitchen omeprazole (PRILOSEC) 20 MG capsule Take 1 capsule (20 mg total) by mouth daily. 30 capsule 3  . prochlorperazine (COMPAZINE) 10 MG tablet Take 1 tablet (10 mg total) by mouth every 6 (six) hours as needed for nausea. 20 tablet 0  . cyclobenzaprine (FLEXERIL) 10 MG tablet Take 1 tablet (10 mg total) by mouth 3 (three) times daily as needed for muscle spasms. (Patient not taking: Reported on 12/26/2016) 12 tablet 0  . simethicone (GAS-X) 80 MG chewable tablet 1 tablet by mouth every 6 hours as needed for burping or flatulence. (Patient not taking: Reported on 12/26/2016) 120 tablet 3   No current facility-administered medications for this encounter.     Physical Findings: The patient is in no acute distress. Patient is alert and oriented.  height is 5\' 1"  (1.549 m) and weight is 158 lb 6.4 oz (71.8 kg). Her oral temperature is 97.8 F (36.6 C). Her blood pressure is 133/73 and her pulse is 72. Her oxygen saturation is 97%. .  Lungs are clear to auscultation bilaterally. Heart has regular rate and rhythm. No palpable cervical, supraclavicular, or axillary adenopathy. Abdomen soft, non-tender, normal bowel sounds. On pelvic examination the external genitalia were unremarkable. A  speculum exam was performed. There are no mucosal lesions noted in the vaginal vault. Mild radiation changes noted at the cervical os The cervix is normal in size on palpation. On bimanual and rectovaginal examination there were no pelvic masses appreciated. Rectal sphincter tone good.   Lab Findings: Lab Results  Component Value Date   WBC 3.5 (L) 08/15/2016   HGB 10.9 (L) 08/15/2016   HCT 34.1 (L) 08/15/2016   MCV 93.2 08/15/2016   PLT 196 08/15/2016    Radiographic Findings: No results found.  Impression:   Stage IIB poorly differentiated squamous cell carcinoma of the cervix. No evidence of persistent or recurrent disease on clinical exam today. Recent PET scan also showing the patient to be in remission.  Plan:  Routine follow-up in April. The patient will see Dr. [name] in January 2019. Encouraged the patient to resume using the vaginal dilator.  ____________________________________ [name], MD

## 2017-01-06 ENCOUNTER — Telehealth: Payer: Self-pay | Admitting: Oncology

## 2017-01-06 NOTE — Telephone Encounter (Signed)
Patient requested a refill on dilaudid.  She last had it filled on 12/20/16.

## 2017-01-11 ENCOUNTER — Other Ambulatory Visit: Payer: Self-pay | Admitting: Radiation Oncology

## 2017-01-11 DIAGNOSIS — C530 Malignant neoplasm of endocervix: Secondary | ICD-10-CM

## 2017-01-11 MED ORDER — HYDROMORPHONE HCL 4 MG PO TABS: 4.0000 mg | ORAL_TABLET | ORAL | 0 refills | Status: DC | PRN

## 2017-01-11 MED FILL — HYDROmorphone HCL 4 MG TABS: 4 | 4 days supply | Qty: 25 | Fill #0

## 2017-01-12 ENCOUNTER — Telehealth: Payer: Self-pay | Admitting: *Deleted

## 2017-01-12 NOTE — Telephone Encounter (Signed)
Contact the patient and scheduled a follow up appt for January 9th at 1pm.

## 2017-01-16 ENCOUNTER — Ambulatory Visit (INDEPENDENT_AMBULATORY_CARE_PROVIDER_SITE_OTHER): Payer: Medicaid Other | Admitting: Neurology

## 2017-01-16 ENCOUNTER — Encounter: Payer: Self-pay | Admitting: Neurology

## 2017-01-16 VITALS — BP 104/70 | HR 91 | Ht 61.0 in | Wt 156.5 lb

## 2017-01-16 DIAGNOSIS — M48062 Spinal stenosis, lumbar region with neurogenic claudication: Secondary | ICD-10-CM

## 2017-01-16 NOTE — Progress Notes (Signed)
[facility] Clinic Note - Initial Visit   Date: 01/16/17  Valerie Kerr MRN: [id number] DOB: [birth year]   Dear Dr. Jarold Song:   Thank you for your kind referral of Valerie Kerr for consultation of lumbar radiculopathy. Although her history is well known to you, please allow [place] to reiterate it for the purpose of our medical record. The patient was accompanied to the clinic by daughter who also provides collateral information.     History of Present Illness: Valerie Kerr is a 56 y.o. 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    Starting around 2008, she started having low back pain described as achy which radiates into her buttocks region.  Symptoms are worse with prolonged sitting, standing, and climbing stairs. Sometimes, she also has leg weakness.  There is no bowel/bladder dysfunction.  She has benefit with dilaudid and methodone which is presribed by [facility].  She has previously been on percocet, flexeril, and ESI.  She last had ESI about 2009 which helped for several years.  She takes gabapentin 600mg  twice daily.   She went to the ER in July for severe low back pain and had MRI lumbra spine which showed moderate stenosis at L3-L4, right exiting L4 nerve impingement, foraminal narrowing at L2-L3 through L4-L5, moderate to severe on the right at L4-L5.  She denies any muscle cramps or numbness/tingling of the legs.  In addition to low back pain, she also complains of left knee pain, both which affect her ability to walk.    Out-side paper records, electronic medical record, and images have been reviewed where available and summarized as:  MRI lumbar spine wo contrast 10/09/2016: 1. Postradiation changes of the spine, no pathologic fracture or MR finding of  metastasis. 2. Moderate canal stenosis L3-4.  RIGHT exited L4 nerve impingement. 3. Neural foraminal narrowing L2-3 through L4-5: Moderate to severe on the RIGHT at L4-5.   Past Medical History:  Diagnosis Date  . Anxiety   . Cervical cancer [facility]) oncologist-  dr gorsuch/ dr Sondra Come   02-20-2016 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---  treatment concurrent chemo (week 6 on hold due to pancytopenia) and radiation therapy (external beam 04-12-2016 to 05-31-2016)  started high-dose rate brachytherapy 05-26-2016  . Chemotherapy-induced thrombocytopenia   . Chronic pain disorder    back,neck-- chronic methadone  . Environmental allergies    allergy to dust mites  . GERD (gastroesophageal reflux disease)   . Headache   . Herniated disc, cervical   . History of external beam radiation therapy 04/12/16-05/31/16   pelvis 45 Gy in 25 fractions, in  30 sessions, pelvis boost 9 Gy in 5 fractions  . History of radiation therapy 06/20/16-07/04/16   tandem and ring applicator to cervix 28 Gy in 5 fractions  . Hypertension   . Hypokalemia   . Hypomagnesemia    po supplement and IV replacement  . Leukopenia due to antineoplastic chemotherapy ([place])   . Lumbar herniated disc   . Pancytopenia due to chemotherapy ([place])   . Periodontitis     Past Surgical History:  Procedure Laterality Date  . ABCESS DRAINAGE Left 1982   breast  . CARPAL TUNNEL RELEASE Bilateral   . CERVICAL DISC SURGERY  2010  . MULTIPLE EXTRACTIONS WITH ALVEOLOPLASTY N/A 04/08/2016   Procedure: Extraction of tooth #'s [phone] with  alveoloplasty  and gross debridement of remaining teeth;  Surgeon: Lenn Cal, DDS;  Location: [facility];  Service: Oral Surgery;  Laterality: N/A;  . TANDEM RING INSERTION N/A 05/26/2016   Procedure: TANDEM RING INSERTION;  Surgeon: [name], MD;  Location[facility];  Service: Urology;  Laterality: N/A;  . TANDEM RING INSERTION N/A 06/02/2016   Procedure:  TANDEM RING INSERTION;  Surgeon: [name], MD;  Location: [facility];  Service: Urology;  Laterality: N/A;  . TANDEM RING INSERTION N/A 06/20/2016   Procedure: TANDEM RING INSERTION;  Surgeon: [name], MD;  Location: [facility];  Service: Urology;  Laterality: N/A;  . TANDEM RING INSERTION N/A 06/23/2016   Procedure: TANDEM RING INSERTION;  Surgeon: [name], MD;  Location: [facility];  Service: Urology;  Laterality: N/A;  . TANDEM RING INSERTION N/A 07/04/2016   Procedure: TANDEM RING INSERTION;  Surgeon: [name], MD;  Location: [facility];  Service: Urology;  Laterality: N/A;     Medications:  Outpatient Encounter Prescriptions as of 01/16/2017  Medication Sig  . ALPRAZolam (XANAX) 0.25 MG tablet Take 1 tablet (0.25 mg total) by mouth 2 (two) times daily as needed for anxiety.  . fluticasone (FLONASE) 50 MCG/ACT nasal spray Place 1 spray into both nostrils daily.  Marland Kitchen gabapentin (NEURONTIN) 300 MG capsule Take 2 capsules (600 mg total) by mouth 2 (two) times daily.  Marland Kitchen HYDROmorphone (DILAUDID) 4 MG tablet Take 1 tablet (4 mg total) by mouth every 4 (four) hours as needed for severe pain.  Marland Kitchen ibuprofen (ADVIL,MOTRIN) 400 MG tablet Take 1 tablet (400 mg total) by mouth every 8 (eight) hours as needed.  Marland Kitchen lisinopril-hydrochlorothiazide (PRINZIDE,ZESTORETIC) 20-25 MG tablet Take 1 tablet by mouth daily.  . methadone (DOLOPHINE) 10 MG/5ML solution Take 55 mg by mouth daily.  Marland Kitchen omeprazole (PRILOSEC) 20 MG capsule Take 1 capsule (20 mg total) by mouth daily.  . prochlorperazine (COMPAZINE) 10 MG tablet Take 1 tablet (10 mg total) by mouth every 6 (six) hours as needed for nausea.  . [DISCONTINUED] cyclobenzaprine (FLEXERIL) 10 MG tablet Take 1 tablet (10 mg total) by mouth 3 (three) times daily as needed for muscle spasms. (Patient not taking: Reported on 12/26/2016)  . [DISCONTINUED] simethicone (GAS-X) 80 MG chewable tablet  1 tablet by mouth every 6 hours as needed for burping or flatulence. (Patient not taking: Reported on 12/26/2016)   No facility-administered encounter medications on file as of 01/16/2017.      Allergies:  Allergies  Allergen Reactions  . Codeine Itching and Rash  . Fentanyl Rash    Skin rash, local swelling from patch  . Hydrocodone-Acetaminophen Rash  . Tramadol Other (See Comments)    GI upset,also trembling sensation    Family History: Family History  Problem Relation Age of Onset  . Cancer Father   . Cancer Sister        throat  . Cancer Brother        throat and stomach  . Cancer Brother        lung    Social History: Social History  Substance Use Topics  . Smoking status: Current Every Day Smoker    Packs/day: 0.20    Years: 45.00    Types: Cigarettes  . Smokeless tobacco: Never Used     Comment: 4 cigs daily  . Alcohol use No   Social History   Social History Narrative   Lives with niece in  a 2 story home.  Has 5 children.  On disability.  Formerly worked in Actuary at [employer].  Education: 11th grade.     Review of Systems:  CONSTITUTIONAL: No fevers, chills, night sweats, or weight loss.   EYES: No visual changes or eye pain ENT: No hearing changes.  No history of nose bleeds.   RESPIRATORY: No cough, wheezing and shortness of breath.   CARDIOVASCULAR: Negative for chest pain, and palpitations.   GI: Negative for abdominal discomfort, blood in stools or black stools.  No recent change in bowel habits.   GU:  No history of incontinence.   MUSCLOSKELETAL: +history of joint pain or swelling.  +myalgias.   SKIN: Negative for lesions, rash, and itching.   HEMATOLOGY/ONCOLOGY: Negative for prolonged bleeding, bruising easily, and swollen nodes.  No history of cancer.   ENDOCRINE: Negative for cold or heat intolerance, polydipsia or goiter.   PSYCH:  No depression or anxiety symptoms.   NEURO: As Above.   Vital Signs:  BP 104/70   Pulse 91    Ht 5\' 1"  (1.549 m)   Wt 156 lb 8 oz (71 kg)   SpO2 96%   BMI 29.57 kg/m    General Medical Exam:   General:  Well appearing, comfortable.   Eyes/ENT: see cranial nerve examination.   Neck: No masses appreciated.  Full range of motion without tenderness.  No carotid bruits. Respiratory:  Clear to auscultation, good air entry bilaterally.   Cardiac:  Regular rate and rhythm, no murmur.   Extremities:  No deformities, edema, or skin discoloration.  Skin:  No rashes or lesions.  Neurological Exam: MENTAL STATUS including orientation to time, place, person, recent and remote memory, attention span and concentration, language, and fund of knowledge is normal.  Speech is not dysarthric.  CRANIAL NERVES: II:  No visual field defects.  Unremarkable fundi.   III-IV-VI: Pupils equal round and reactive to light.  Normal conjugate, extra-ocular eye movements in all directions of gaze.  No nystagmus.  No ptosis.   V:  Normal facial sensation.     VII:  Normal facial symmetry and movements.   VIII:  Normal hearing and vestibular function.   IX-X:  Normal palatal movement.   XI:  Normal shoulder shrug and head rotation.   XII:  Normal tongue strength and range of motion, no deviation or fasciculation.  MOTOR:  No atrophy, fasciculations or abnormal movements.  No pronator drift.  Tone is normal.    Right Upper Extremity:    Left Upper Extremity:    Deltoid  5/5   Deltoid  5/5   Biceps  5/5   Biceps  5/5   Triceps  5/5   Triceps  5/5   Wrist extensors  5/5   Wrist extensors  5/5   Wrist flexors  5/5   Wrist flexors  5/5   Finger extensors  5/5   Finger extensors  5/5   Finger flexors  5/5   Finger flexors  5/5   Dorsal interossei  5/5   Dorsal interossei  5/5   Abductor pollicis  5/5   Abductor pollicis  5/5   Tone (Ashworth scale)  0  Tone (Ashworth scale)  0   Right Lower Extremity:    Left Lower Extremity:    Hip flexors  5/5   Hip flexors  5/5   Hip extensors  5/5   Hip extensors  5/5    Knee flexors  5/5   Knee flexors  5/5   Knee extensors  5/5   Knee extensors  5/5   Dorsiflexors  5/5   Dorsiflexors  5/5   Plantarflexors  5/5   Plantarflexors  5/5   Toe extensors  5/5   Toe extensors  5/5   Toe flexors  5/5   Toe flexors  5/5   Tone (Ashworth scale)  0  Tone (Ashworth scale)  0   MSRs:  Right                                                                 Left brachioradialis 2+  brachioradialis 2+  biceps 2+  biceps 2+  triceps 2+  triceps 2+  patellar 3+  patellar 3+  ankle jerk 2+  ankle jerk 2+  Hoffman no  Hoffman no  plantar response down  plantar response down   SENSORY:  Normal and symmetric perception of light touch, pinprick, vibration, and proprioception.     COORDINATION/GAIT: Normal finger-to- nose-finger.  Intact rapid alternating movements bilaterally.  Gait is antalgic and slow, unassisted.  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  Imaging was personally viewed with patient. She is currently taking gabapentin 600mg  BID, as well as dilaudid 4mg  q4h, and methadone 55mg /d.  I explained to patient that our office is non-opioid prescribing and that she would need to establish care with pain management or continue with her pain clinic. She will be referred for [facility] and physical therapy.  We also discussed that if there is no improvement with ESI, surgery may be considered.  Patient and her daughter had many questions which were answered to the best of my ability.   Thank you for allowing me to participate in patient's care.  If I can answer any additional questions, I would be pleased to do so.    Sincerely,    Janeil Schexnayder K. Posey Pronto, DO

## 2017-01-16 NOTE — Patient Instructions (Addendum)
Referral to physical therapy for low back strengthening  Referral to Pain Management for epidural steroid injection

## 2017-01-18 ENCOUNTER — Encounter: Payer: Self-pay | Admitting: Family Medicine

## 2017-01-18 ENCOUNTER — Ambulatory Visit: Payer: Medicaid Other | Attending: Family Medicine | Admitting: Family Medicine

## 2017-01-18 VITALS — BP 129/82 | HR 94 | Temp 97.4°F | Ht 61.0 in | Wt 158.4 lb

## 2017-01-18 DIAGNOSIS — G8929 Other chronic pain: Secondary | ICD-10-CM | POA: Diagnosis not present

## 2017-01-18 DIAGNOSIS — Z923 Personal history of irradiation: Secondary | ICD-10-CM | POA: Insufficient documentation

## 2017-01-18 DIAGNOSIS — M25562 Pain in left knee: Secondary | ICD-10-CM | POA: Insufficient documentation

## 2017-01-18 DIAGNOSIS — Z9221 Personal history of antineoplastic chemotherapy: Secondary | ICD-10-CM | POA: Diagnosis not present

## 2017-01-18 DIAGNOSIS — C530 Malignant neoplasm of endocervix: Secondary | ICD-10-CM | POA: Diagnosis not present

## 2017-01-18 DIAGNOSIS — K219 Gastro-esophageal reflux disease without esophagitis: Secondary | ICD-10-CM

## 2017-01-18 DIAGNOSIS — Z79899 Other long term (current) drug therapy: Secondary | ICD-10-CM | POA: Diagnosis not present

## 2017-01-18 DIAGNOSIS — G4700 Insomnia, unspecified: Secondary | ICD-10-CM | POA: Diagnosis not present

## 2017-01-18 DIAGNOSIS — F329 Major depressive disorder, single episode, unspecified: Secondary | ICD-10-CM | POA: Diagnosis not present

## 2017-01-18 DIAGNOSIS — M48062 Spinal stenosis, lumbar region with neurogenic claudication: Secondary | ICD-10-CM | POA: Insufficient documentation

## 2017-01-18 DIAGNOSIS — I10 Essential (primary) hypertension: Secondary | ICD-10-CM | POA: Diagnosis not present

## 2017-01-18 DIAGNOSIS — Z8541 Personal history of malignant neoplasm of cervix uteri: Secondary | ICD-10-CM | POA: Insufficient documentation

## 2017-01-18 DIAGNOSIS — M4800 Spinal stenosis, site unspecified: Secondary | ICD-10-CM | POA: Insufficient documentation

## 2017-01-18 DIAGNOSIS — J309 Allergic rhinitis, unspecified: Secondary | ICD-10-CM | POA: Diagnosis not present

## 2017-01-18 DIAGNOSIS — F321 Major depressive disorder, single episode, moderate: Secondary | ICD-10-CM | POA: Diagnosis not present

## 2017-01-18 DIAGNOSIS — G4709 Other insomnia: Secondary | ICD-10-CM

## 2017-01-18 MED ORDER — OMEPRAZOLE 20 MG PO CPDR: 20.0000 mg | DELAYED_RELEASE_CAPSULE | Freq: Every day | ORAL | 3 refills | Status: DC

## 2017-01-18 MED ORDER — DICLOFENAC SODIUM 1 % TD GEL: 4.0000 g | Freq: Four times a day (QID) | TRANSDERMAL | 1 refills | Status: DC

## 2017-01-18 MED ORDER — LISINOPRIL-HYDROCHLOROTHIAZIDE 20-25 MG PO TABS: 1.0000 | ORAL_TABLET | Freq: Every day | ORAL | 3 refills | Status: DC

## 2017-01-18 MED ORDER — CETIRIZINE HCL 10 MG PO TABS: 10.0000 mg | ORAL_TABLET | Freq: Every day | ORAL | 1 refills | Status: DC

## 2017-01-18 MED ORDER — MIRTAZAPINE 15 MG PO TABS: 15.0000 mg | ORAL_TABLET | Freq: Every day | ORAL | 3 refills | Status: DC

## 2017-01-18 NOTE — Progress Notes (Signed)
Subjective:  Patient ID: Valerie Kerr, female    DOB: 18-Oct-1960  Age: 56 y.o. MRN: [id number]  CC: Hypertension and Back Pain   HPI Valerie Kerr is a 56 year old female with a history of hypertension, gastroesophageal reflux disease, infiltrative squamous cell carcinoma of the cervical cancer ( status post chemoradiation which she completed in 05/2016 followed by the [facility]), lumbar stenosis here for follow-up visit.  She was recently seen by neurology due to her lumbar stenosis and neurogenic claudication for which she had been referred to rehabilitation medicine for possible epidural injections for which she has an upcoming appointment. She continues to complain of severe back pain which radiates to both legs. She has associated intermittent numbness in both legs but denies loss of sphincteric function.  She also complains of left knee pain for the last one year which has worsened lately and is associated with swelling; she sometimes feels her knees about to give out on her. Current pain medications which include Dilaudid, methadone and gabapentin have not helped her symptoms.  She informs me she is still depressed as she worries about where to live but on the other hand is glad that her Medicaid and disability have come through.  Seen by GYN oncology last month and radiation oncology 3 weeks ago and she seems to be in remission at this time as per PET scan findings.  PET scan 10/20/16: IMPRESSION: 1. The accentuated cervical signal shown on prior exams has essentially completely resolved. The suspected hypermetabolic for inguinal lymph node shown on the prior exam has also resolved. There is no current hypermetabolic activity to suggest active malignancy. 2. Stranding along the adipose tissues of the lower pelvis, possibly a response to radiation therapy, correlate with treatment history. 3. Other imaging findings of potential clinical significance: Mucous retention cyst  in the left maxillary sinus. Aortic Atherosclerosis (ICD10-I70.0). Nonobstructive left nephrolithiasis. Suspected pelvic floor laxity.   She complains of insomnia and would like something for sleep but refuses any treatment for depression. Denies suicidal ideation or intentions. She recently received Xanax at her visit with radiation oncology.  Past Medical History:  Diagnosis Date  . Anxiety   . Cervical cancer [facility]) oncologist-  dr gorsuch/ dr Sondra Come   02-20-2016 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---  treatment concurrent chemo (week 6 on hold due to pancytopenia) and radiation therapy (external beam 04-12-2016 to 05-31-2016)  started high-dose rate brachytherapy 05-26-2016  . Chemotherapy-induced thrombocytopenia   . Chronic pain disorder    back,neck-- chronic methadone  . Environmental allergies    allergy to dust mites  . GERD (gastroesophageal reflux disease)   . Headache   . Herniated disc, cervical   . History of external beam radiation therapy 04/12/16-05/31/16   pelvis 45 Gy in 25 fractions, in  30 sessions, pelvis boost 9 Gy in 5 fractions  . History of radiation therapy 06/20/16-07/04/16   tandem and ring applicator to cervix 28 Gy in 5 fractions  . Hypertension   . Hypokalemia   . Hypomagnesemia    po supplement and IV replacement  . Leukopenia due to antineoplastic chemotherapy ([place])   . Lumbar herniated disc   . Pancytopenia due to chemotherapy ([place])   . Periodontitis      Outpatient Medications Prior to Visit  Medication Sig Dispense Refill  . ALPRAZolam (XANAX) 0.25 MG tablet Take 1 tablet (0.25 mg total) by mouth 2 (two) times daily as needed for anxiety. 30 tablet 0  . fluticasone (FLONASE)  50 MCG/ACT nasal spray Place 1 spray into both nostrils daily. 16 g 1  . gabapentin (NEURONTIN) 300 MG capsule Take 2 capsules (600 mg total) by mouth 2 (two) times daily. 120 capsule 3  . HYDROmorphone (DILAUDID) 4 MG tablet Take 1 tablet (4 mg  total) by mouth every 4 (four) hours as needed for severe pain. 25 tablet 0  . ibuprofen (ADVIL,MOTRIN) 400 MG tablet Take 1 tablet (400 mg total) by mouth every 8 (eight) hours as needed. 15 tablet 0  . methadone (DOLOPHINE) 10 MG/5ML solution Take 55 mg by mouth daily.    . prochlorperazine (COMPAZINE) 10 MG tablet Take 1 tablet (10 mg total) by mouth every 6 (six) hours as needed for nausea. 20 tablet 0  . lisinopril-hydrochlorothiazide (PRINZIDE,ZESTORETIC) 20-25 MG tablet Take 1 tablet by mouth daily. 30 tablet 3  . omeprazole (PRILOSEC) 20 MG capsule Take 1 capsule (20 mg total) by mouth daily. 30 capsule 3   No facility-administered medications prior to visit.     ROS Review of Systems  Constitutional: Positive for fatigue. Negative for activity change and appetite change.  HENT: Positive for congestion and postnasal drip. Negative for sinus pressure and sore throat.   Eyes: Negative for visual disturbance.  Respiratory: Negative for cough, chest tightness, shortness of breath and wheezing.   Cardiovascular: Negative for chest pain and palpitations.  Gastrointestinal: Negative for abdominal distention, abdominal pain and constipation.  Endocrine: Negative for polydipsia.  Genitourinary: Negative for dysuria and frequency.  Musculoskeletal:       See hpi  Skin: Negative for rash.  Neurological: Negative for tremors, light-headedness and numbness.  Hematological: Does not bruise/bleed easily.  Psychiatric/Behavioral: Positive for dysphoric mood and sleep disturbance. Negative for agitation and behavioral problems.    Objective:  BP 129/82   Pulse 94   Temp (!) 97.4 F (36.3 C) (Oral)   Ht 5\' 1"  (1.549 m)   Wt 158 lb 6.4 oz (71.8 kg)   SpO2 97%   BMI 29.93 kg/m   BP/Weight 01/18/2017 01/16/2017 75/08/4330  Systolic BP 951 884 166  Diastolic BP 82 70 73  Wt. (Lbs) 158.4 156.5 158.4  BMI 29.93 29.57 29.93      Physical Exam  Constitutional: She is oriented to person,  place, and time. She appears well-developed and well-nourished.  Cardiovascular: Normal rate, normal heart sounds and intact distal pulses.   No murmur heard. Pulmonary/Chest: Effort normal and breath sounds normal. She has no wheezes. She has no rales. She exhibits no tenderness.  Abdominal: Soft. Bowel sounds are normal. She exhibits no distension and no mass. There is no tenderness.  Musculoskeletal: Normal range of motion. She exhibits tenderness (slight tenderness on palpation of the lumbar spine; negative straight leg raise bilaterally).  Left knee edema Crepitus and tenderness and range of motion  Neurological: She is alert and oriented to person, place, and time.  Skin: Skin is warm and dry.  Psychiatric: She has a normal mood and affect.     Assessment & Plan:   1. Gastroesophageal reflux disease, esophagitis presence not specified Controlled - omeprazole (PRILOSEC) 20 MG capsule; Take 1 capsule (20 mg total) by mouth daily.  Dispense: 30 capsule; Refill: 3  2. Malignant neoplasm of endocervix ([place]) Stage IIB poorly differentiated squamous cell carcinoma Status post chemotherapy and radiation Currently in remission as per recent PET scan Follow-up appointment with GYN oncology and radiation oncology  3. Essential hypertension Controlled Low sodium diet - lisinopril-hydrochlorothiazide (PRINZIDE,ZESTORETIC) 20-25 MG tablet;  Take 1 tablet by mouth daily.  Dispense: 30 tablet; Refill: 3  4. Current moderate episode of major depressive disorder without prior episode ([place]) Uncontrolled Declines initiation of SSRI Prescribed Remeron which help with insomnia and apetitie - mirtazapine (REMERON) 15 MG tablet; Take 1 tablet (15 mg total) by mouth at bedtime.  Dispense: 30 tablet; Refill: 3  5. Allergic sinusitis Acute exacerbation Continue Flonase; Zyrtec added to regimen - mirtazapine (REMERON) 15 MG tablet; Take 1 tablet (15 mg total) by mouth at bedtime.  Dispense: 30  tablet; Refill: 3 - cetirizine (ZYRTEC) 10 MG tablet; Take 1 tablet (10 mg total) by mouth daily.  Dispense: 30 tablet; Refill: 1  6. Other insomnia Placed on Remeron  7. Chronic pain of left knee Will need to exclude effusion - diclofenac sodium (VOLTAREN) 1 % GEL; Apply 4 g topically 4 (four) times daily.  Dispense: 100 g; Refill: 1 - DG Knee Complete 4 Views Left; Future  8. Spinal stenosis of lumbar region with neurogenic claudication Declines addition of Cymbalta due to GI side effects Scheduled for ESI Continue Gabapentin, Dilaudid, methadone   Meds ordered this encounter  Medications  . mirtazapine (REMERON) 15 MG tablet    Sig: Take 1 tablet (15 mg total) by mouth at bedtime.    Dispense:  30 tablet    Refill:  3  . lisinopril-hydrochlorothiazide (PRINZIDE,ZESTORETIC) 20-25 MG tablet    Sig: Take 1 tablet by mouth daily.    Dispense:  30 tablet    Refill:  3    Discontinue previous dose  . omeprazole (PRILOSEC) 20 MG capsule    Sig: Take 1 capsule (20 mg total) by mouth daily.    Dispense:  30 capsule    Refill:  3  . cetirizine (ZYRTEC) 10 MG tablet    Sig: Take 1 tablet (10 mg total) by mouth daily.    Dispense:  30 tablet    Refill:  1  . diclofenac sodium (VOLTAREN) 1 % GEL    Sig: Apply 4 g topically 4 (four) times daily.    Dispense:  100 g    Refill:  1    Follow-up: Return in about 3 months (around 04/20/2017) for Follow-up of chronic medical conditions.   [name] MD

## 2017-01-18 NOTE — Patient Instructions (Signed)

## 2017-01-24 ENCOUNTER — Other Ambulatory Visit: Payer: Self-pay | Admitting: *Deleted

## 2017-01-24 ENCOUNTER — Telehealth: Payer: Self-pay | Admitting: *Deleted

## 2017-01-24 NOTE — Telephone Encounter (Signed)
Please send referral for ESI at L3-4 at [facility] imaging.

## 2017-01-24 NOTE — Telephone Encounter (Signed)
Jasmine from [facility] called to let us know that Dr. Maryjean Ka has denied this patient since he does not know where she is getting her methadone.  He said that she can get her epidural injections at [facility] and if that does not help then she can seek the opinion of a neurosurgeon.

## 2017-01-25 ENCOUNTER — Other Ambulatory Visit: Payer: Self-pay | Admitting: *Deleted

## 2017-01-25 DIAGNOSIS — M48062 Spinal stenosis, lumbar region with neurogenic claudication: Secondary | ICD-10-CM

## 2017-01-25 NOTE — Telephone Encounter (Signed)
I have called Roberta to check on how to order this.

## 2017-01-25 NOTE — Telephone Encounter (Signed)
I spoke with [facility] and order placed.  DG EPI.

## 2017-01-30 ENCOUNTER — Telehealth: Payer: Self-pay | Admitting: Oncology

## 2017-01-30 NOTE — Telephone Encounter (Addendum)
Patient called and said she is being set up with the pain clinic/injections through her neurologist.  She said she does not have an appointment yet and is wondering if Dr. Sondra Come can fill her pain medication (dilaudid) one more time.  She last had it filled on 01/11/17.

## 2017-01-31 ENCOUNTER — Other Ambulatory Visit: Payer: Self-pay | Admitting: Radiation Oncology

## 2017-01-31 MED ORDER — HYDROMORPHONE HCL 4 MG PO TABS: 4.0000 mg | ORAL_TABLET | ORAL | 0 refills | Status: DC | PRN

## 2017-01-31 MED FILL — HYDROmorphone HCL 4 MG TABS: 4 | 4 days supply | Qty: 25 | Fill #0

## 2017-02-08 ENCOUNTER — Telehealth: Payer: Self-pay | Admitting: Family Medicine

## 2017-02-08 DIAGNOSIS — M5126 Other intervertebral disc displacement, lumbar region: Secondary | ICD-10-CM

## 2017-02-08 NOTE — Telephone Encounter (Signed)
Pt called to speak with the doctor since she need a place to get her shot under anesthesia, for her lower back, please call her back

## 2017-02-09 NOTE — Telephone Encounter (Signed)
That information should be provided to her by the physician who will be performing her shot.

## 2017-02-13 NOTE — Telephone Encounter (Signed)
Pt. Called requesting a referral [place]. For her back. Pt. States she has Sciatica. Please f/u with pt.

## 2017-02-13 NOTE — Telephone Encounter (Signed)
Will route to PCP 

## 2017-02-15 NOTE — Telephone Encounter (Signed)
Pt was called and informed of referral being placed. ?

## 2017-02-15 NOTE — Telephone Encounter (Signed)
Done

## 2017-02-20 ENCOUNTER — Ambulatory Visit: Payer: Medicaid Other | Admitting: Physical Therapy

## 2017-02-21 ENCOUNTER — Other Ambulatory Visit: Payer: Self-pay | Admitting: Family Medicine

## 2017-02-21 ENCOUNTER — Telehealth: Payer: Self-pay | Admitting: Oncology

## 2017-02-21 DIAGNOSIS — J309 Allergic rhinitis, unspecified: Secondary | ICD-10-CM

## 2017-02-21 DIAGNOSIS — G8929 Other chronic pain: Secondary | ICD-10-CM

## 2017-02-21 DIAGNOSIS — M25562 Pain in left knee: Principal | ICD-10-CM

## 2017-02-21 NOTE — Telephone Encounter (Signed)
Patient called and requested refills on dilaudid (last filled 01/31/17) and compazine (last filled 12/26/16).  She said she is waiting for a referral to a neurologist for injections to her back.  She said she had an appointment with a different neurologist but they did not do sedation for the injections.  She said she would like the refill one more time while waiting for the second referral.

## 2017-02-23 ENCOUNTER — Other Ambulatory Visit: Payer: Self-pay | Admitting: Radiation Oncology

## 2017-02-23 DIAGNOSIS — C531 Malignant neoplasm of exocervix: Secondary | ICD-10-CM

## 2017-02-23 MED ORDER — HYDROMORPHONE HCL 4 MG PO TABS: 4.0000 mg | ORAL_TABLET | ORAL | 0 refills | Status: DC | PRN

## 2017-02-23 MED ORDER — PROCHLORPERAZINE MALEATE 10 MG PO TABS: 10.0000 mg | ORAL_TABLET | Freq: Four times a day (QID) | ORAL | 0 refills | Status: DC | PRN

## 2017-02-24 MED FILL — HYDROmorphone HCL 4 MG TABS: 4 | 4 days supply | Qty: 25 | Fill #0

## 2017-03-16 ENCOUNTER — Telehealth: Payer: Self-pay | Admitting: Family Medicine

## 2017-03-16 ENCOUNTER — Telehealth: Payer: Self-pay | Admitting: Oncology

## 2017-03-16 ENCOUNTER — Other Ambulatory Visit: Payer: Self-pay | Admitting: Radiation Oncology

## 2017-03-16 MED ORDER — HYDROMORPHONE HCL 4 MG PO TABS: 4.0000 mg | ORAL_TABLET | ORAL | 0 refills | Status: DC | PRN

## 2017-03-16 NOTE — Telephone Encounter (Signed)
Pt was called and informed that Xray is still active and she can get it done anytime before 02/2018.

## 2017-03-16 NOTE — Telephone Encounter (Signed)
Called patient and let her know that the Dilaudid refill is available for pick up in [facility].

## 2017-03-16 NOTE — Telephone Encounter (Signed)
Pt. Called stating that her PCP had told her to have an x-ray done on her knee. Pt. States that she never got it done and would like for her PCP to put another order in. Please f/u

## 2017-03-16 NOTE — Telephone Encounter (Signed)
Patient called and said she still has not seen the neurosurgeon for her back pain.  She would like a refill on Dilaudid which was last filled on 02/23/17.

## 2017-03-17 MED FILL — HYDROmorphone HCL 4 MG TABS: 4 | 4 days supply | Qty: 25 | Fill #0

## 2017-03-27 ENCOUNTER — Other Ambulatory Visit: Payer: Self-pay | Admitting: Radiation Oncology

## 2017-03-27 DIAGNOSIS — C531 Malignant neoplasm of exocervix: Secondary | ICD-10-CM

## 2017-03-27 MED ORDER — PROCHLORPERAZINE MALEATE 10 MG PO TABS: 10.0000 mg | ORAL_TABLET | Freq: Four times a day (QID) | ORAL | 0 refills | Status: DC | PRN

## 2017-03-31 ENCOUNTER — Other Ambulatory Visit: Payer: Self-pay | Admitting: Family Medicine

## 2017-03-31 DIAGNOSIS — G8929 Other chronic pain: Secondary | ICD-10-CM

## 2017-03-31 DIAGNOSIS — M25562 Pain in left knee: Principal | ICD-10-CM

## 2017-04-05 ENCOUNTER — Ambulatory Visit: Payer: Self-pay | Admitting: Gynecologic Oncology

## 2017-04-20 ENCOUNTER — Other Ambulatory Visit: Payer: Self-pay | Admitting: Family Medicine

## 2017-04-20 ENCOUNTER — Ambulatory Visit: Payer: Self-pay | Admitting: Family Medicine

## 2017-04-20 DIAGNOSIS — K219 Gastro-esophageal reflux disease without esophagitis: Secondary | ICD-10-CM

## 2017-04-20 DIAGNOSIS — G4709 Other insomnia: Secondary | ICD-10-CM

## 2017-04-20 DIAGNOSIS — F321 Major depressive disorder, single episode, moderate: Secondary | ICD-10-CM

## 2017-04-20 DIAGNOSIS — I10 Essential (primary) hypertension: Secondary | ICD-10-CM

## 2017-04-26 ENCOUNTER — Telehealth: Payer: Self-pay | Admitting: *Deleted

## 2017-04-26 ENCOUNTER — Telehealth: Payer: Self-pay | Admitting: Oncology

## 2017-04-26 NOTE — Telephone Encounter (Signed)
Patient called and requested a refill on Xanax, Compazine and Dilaudid (last filled on 03/16/17).  She said she still has not been contacted by the Neurologist and is going to stop by the office to try to get an appointment.  She also said she missed her last appointment with Dr. [name] and would like to reschedule.  Transferred her to GYN Oncology to reschedule.

## 2017-04-26 NOTE — Telephone Encounter (Signed)
Returned the patient's call and rescheduled the appt for March 7th at 4pm

## 2017-04-27 ENCOUNTER — Other Ambulatory Visit: Payer: Self-pay | Admitting: Radiation Oncology

## 2017-04-27 DIAGNOSIS — C531 Malignant neoplasm of exocervix: Secondary | ICD-10-CM

## 2017-04-27 DIAGNOSIS — F418 Other specified anxiety disorders: Secondary | ICD-10-CM

## 2017-04-27 MED ORDER — PROCHLORPERAZINE MALEATE 10 MG PO TABS: 10.0000 mg | ORAL_TABLET | Freq: Four times a day (QID) | ORAL | 0 refills | Status: DC | PRN

## 2017-04-27 MED ORDER — HYDROMORPHONE HCL 4 MG PO TABS: 4.0000 mg | ORAL_TABLET | ORAL | 0 refills | Status: DC | PRN

## 2017-04-27 MED ORDER — ALPRAZOLAM 0.25 MG PO TABS: 0.2500 mg | ORAL_TABLET | Freq: Two times a day (BID) | ORAL | 0 refills | Status: DC | PRN

## 2017-04-28 MED FILL — ALPRAZolam 0.25 MG TABS: 0.25 | 15 days supply | Qty: 30 | Fill #0

## 2017-04-28 MED FILL — HYDROmorphone HCL 4 MG TABS: 4 | 4 days supply | Qty: 25 | Fill #0

## 2017-05-03 ENCOUNTER — Telehealth: Payer: Self-pay | Admitting: Family Medicine

## 2017-05-03 ENCOUNTER — Other Ambulatory Visit: Payer: Self-pay | Admitting: Family Medicine

## 2017-05-03 DIAGNOSIS — G4709 Other insomnia: Secondary | ICD-10-CM

## 2017-05-03 DIAGNOSIS — I10 Essential (primary) hypertension: Secondary | ICD-10-CM

## 2017-05-03 DIAGNOSIS — K219 Gastro-esophageal reflux disease without esophagitis: Secondary | ICD-10-CM

## 2017-05-03 DIAGNOSIS — F321 Major depressive disorder, single episode, moderate: Secondary | ICD-10-CM

## 2017-05-03 NOTE — Telephone Encounter (Signed)
Patient requested for something to sedate her the day of her appointment regarding her knees. Please fu with patient

## 2017-05-19 ENCOUNTER — Ambulatory Visit: Payer: Medicaid Other | Attending: Family Medicine | Admitting: Family Medicine

## 2017-05-19 ENCOUNTER — Encounter: Payer: Self-pay | Admitting: Family Medicine

## 2017-05-19 VITALS — BP 115/76 | HR 81 | Temp 98.2°F | Ht 61.0 in | Wt 166.6 lb

## 2017-05-19 DIAGNOSIS — Z923 Personal history of irradiation: Secondary | ICD-10-CM | POA: Diagnosis not present

## 2017-05-19 DIAGNOSIS — G629 Polyneuropathy, unspecified: Secondary | ICD-10-CM | POA: Diagnosis not present

## 2017-05-19 DIAGNOSIS — Z8541 Personal history of malignant neoplasm of cervix uteri: Secondary | ICD-10-CM | POA: Diagnosis not present

## 2017-05-19 DIAGNOSIS — F419 Anxiety disorder, unspecified: Secondary | ICD-10-CM | POA: Insufficient documentation

## 2017-05-19 DIAGNOSIS — M48062 Spinal stenosis, lumbar region with neurogenic claudication: Secondary | ICD-10-CM | POA: Diagnosis not present

## 2017-05-19 DIAGNOSIS — Z9221 Personal history of antineoplastic chemotherapy: Secondary | ICD-10-CM | POA: Insufficient documentation

## 2017-05-19 DIAGNOSIS — Z7951 Long term (current) use of inhaled steroids: Secondary | ICD-10-CM | POA: Diagnosis not present

## 2017-05-19 DIAGNOSIS — Z885 Allergy status to narcotic agent status: Secondary | ICD-10-CM | POA: Diagnosis not present

## 2017-05-19 DIAGNOSIS — G8929 Other chronic pain: Secondary | ICD-10-CM | POA: Insufficient documentation

## 2017-05-19 DIAGNOSIS — I10 Essential (primary) hypertension: Secondary | ICD-10-CM | POA: Insufficient documentation

## 2017-05-19 DIAGNOSIS — J0120 Acute ethmoidal sinusitis, unspecified: Secondary | ICD-10-CM | POA: Insufficient documentation

## 2017-05-19 DIAGNOSIS — Z79891 Long term (current) use of opiate analgesic: Secondary | ICD-10-CM | POA: Diagnosis not present

## 2017-05-19 DIAGNOSIS — K219 Gastro-esophageal reflux disease without esophagitis: Secondary | ICD-10-CM | POA: Insufficient documentation

## 2017-05-19 DIAGNOSIS — Z79899 Other long term (current) drug therapy: Secondary | ICD-10-CM | POA: Insufficient documentation

## 2017-05-19 MED ORDER — OMEPRAZOLE 20 MG PO CPDR: 20.0000 mg | DELAYED_RELEASE_CAPSULE | Freq: Every day | ORAL | 3 refills | Status: DC

## 2017-05-19 MED ORDER — LISINOPRIL-HYDROCHLOROTHIAZIDE 20-25 MG PO TABS: 1.0000 | ORAL_TABLET | Freq: Every day | ORAL | 3 refills | Status: DC

## 2017-05-19 MED ORDER — BENZONATATE 100 MG PO CAPS: 100.0000 mg | ORAL_CAPSULE | Freq: Two times a day (BID) | ORAL | 0 refills | Status: DC | PRN

## 2017-05-19 MED ORDER — AMOXICILLIN 500 MG PO CAPS: 500.0000 mg | ORAL_CAPSULE | Freq: Three times a day (TID) | ORAL | 0 refills | Status: DC

## 2017-05-19 MED ORDER — FLUTICASONE PROPIONATE 50 MCG/ACT NA SUSP: 1.0000 | Freq: Every day | NASAL | 1 refills | Status: DC

## 2017-05-19 MED ORDER — IBUPROFEN 600 MG PO TABS: 600.0000 mg | ORAL_TABLET | Freq: Three times a day (TID) | ORAL | 2 refills | Status: DC | PRN

## 2017-05-19 MED ORDER — GABAPENTIN 300 MG PO CAPS: 600.0000 mg | ORAL_CAPSULE | Freq: Two times a day (BID) | ORAL | 3 refills | Status: DC

## 2017-05-19 MED ORDER — CETIRIZINE HCL 10 MG PO TABS: 10.0000 mg | ORAL_TABLET | Freq: Every day | ORAL | 1 refills | Status: DC

## 2017-05-19 NOTE — Patient Instructions (Signed)

## 2017-05-20 ENCOUNTER — Encounter: Payer: Self-pay | Admitting: Family Medicine

## 2017-05-20 NOTE — Progress Notes (Signed)
Subjective:  Patient ID: Valerie Kerr, female    DOB: 1960-04-15  Age: 57 y.o. 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  She was referred to a spine surgeon for her low back pain but declined appointment by [facility] and [facility] [facility] as she is seeking a Center that will sedate her prior to an Epidural Spinal Injection as was done for her previously in [place]. She suffers from  Lumbar stenosis and neurogenic claudication.. She continues to complain of severe back pain which radiates to both legs. She has associated intermittent numbness in both legs but denies loss of sphincteric function. Current pain medications which include Dilaudid, methadone and gabapentin have not helped her symptoms.   She complains of a 2-3 day history of sinus pressure nasal congestion, cough productive of whitish sputum and a subjective fever She is followed by Oncology and GYN Oncology and is currently in remission at this time as per PET scan findings.  PET scan 10/20/16: IMPRESSION: 1. The accentuated cervical signal shown on prior exams has essentially completely resolved. The suspected hypermetabolic for inguinal lymph node shown on the prior exam has also resolved. There is no current hypermetabolic activity to suggest active malignancy. 2. Stranding along the adipose tissues of the lower pelvis, possibly a response to radiation therapy, correlate with treatment history. 3. Other imaging findings of potential clinical significance: Mucous retention cyst in the left maxillary sinus. Aortic Atherosclerosis (ICD10-I70.0). Nonobstructive left nephrolithiasis. Suspected pelvic floor laxity.  Past Medical  History:  Diagnosis Date  . Anxiety   . Cervical cancer [facility]) oncologist-  dr gorsuch/ dr Sondra Come   02-20-2016 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---  treatment concurrent chemo (week 6 on hold due to pancytopenia) and radiation therapy (external beam 04-12-2016 to 05-31-2016)  started high-dose rate brachytherapy 05-26-2016  . Chemotherapy-induced thrombocytopenia   . Chronic pain disorder    back,neck-- chronic methadone  . Environmental allergies    allergy to dust mites  . GERD (gastroesophageal reflux disease)   . Headache   . Herniated disc, cervical   . History of external beam radiation therapy 04/12/16-05/31/16   pelvis 45 Gy in 25 fractions, in  30 sessions, pelvis boost 9 Gy in 5 fractions  . History of radiation therapy 06/20/16-07/04/16   tandem and ring applicator to cervix 28 Gy in 5 fractions  . Hypertension   . Hypokalemia   . Hypomagnesemia    po supplement and IV replacement  . Leukopenia due to antineoplastic chemotherapy ([place])   . Lumbar herniated disc   . Pancytopenia due to chemotherapy ([name])   . Periodontitis     Past Surgical History:  Procedure Laterality Date  . ABCESS DRAINAGE Left 1982   breast  . CARPAL TUNNEL RELEASE Bilateral   . CERVICAL DISC SURGERY  2010  . MULTIPLE EXTRACTIONS WITH ALVEOLOPLASTY N/A 04/08/2016   Procedure: Extraction of tooth #'s [phone] with alveoloplasty  and gross debridement of remaining teeth;  Surgeon: Lenn Cal, DDS;  Location: [place];  Service: Oral Surgery;  Laterality: N/A;  . TANDEM RING INSERTION N/A 05/26/2016   Procedure: TANDEM RING INSERTION;  Surgeon: [name], MD;  Location: [facility];  Service: Urology;  Laterality: N/A;  . TANDEM RING  INSERTION N/A 06/02/2016   Procedure: TANDEM RING INSERTION;  Surgeon: [name], MD;  Location: [facility];  Service: Urology;  Laterality: N/A;  . TANDEM RING INSERTION N/A 06/20/2016   Procedure: TANDEM RING  INSERTION;  Surgeon: [name], MD;  Location: [facility];  Service: Urology;  Laterality: N/A;  . TANDEM RING INSERTION N/A 06/23/2016   Procedure: TANDEM RING INSERTION;  Surgeon: [name], MD;  Location: [facility];  Service: Urology;  Laterality: N/A;  . TANDEM RING INSERTION N/A 07/04/2016   Procedure: TANDEM RING INSERTION;  Surgeon: [name], MD;  Location: [facility];  Service: Urology;  Laterality: N/A;    Allergies  Allergen Reactions  . Codeine Itching and Rash  . Fentanyl Rash    Skin rash, local swelling from patch  . Hydrocodone-Acetaminophen Rash  . Tramadol Other (See Comments)    GI upset,also trembling sensation     Outpatient Medications Prior to Visit  Medication Sig Dispense Refill  . ALPRAZolam (XANAX) 0.25 MG tablet Take 1 tablet (0.25 mg total) by mouth 2 (two) times daily as needed for anxiety. 30 tablet 0  . HYDROmorphone (DILAUDID) 4 MG tablet Take 1 tablet (4 mg total) by mouth every 4 (four) hours as needed for severe pain. 25 tablet 0  . methadone (DOLOPHINE) 10 MG/5ML solution Take 55 mg by mouth daily.    . mirtazapine (REMERON) 15 MG tablet Take 1 tablet (15 mg total) by mouth at bedtime. 30 tablet 3  . prochlorperazine (COMPAZINE) 10 MG tablet Take 1 tablet (10 mg total) by mouth every 6 (six) hours as needed for nausea. 20 tablet 0  . VOLTAREN 1 % GEL Apply 4 g topically 4 (four) times daily. 100 g 0  . cetirizine (ZYRTEC) 10 MG tablet Take 1 tablet (10 mg total) by mouth daily. 30 tablet 1  . fluticasone (FLONASE) 50 MCG/ACT nasal spray Place 1 spray into both nostrils daily. 16 g 1  . gabapentin (NEURONTIN) 300 MG capsule Take 2 capsules (600 mg total) by mouth 2 (two) times daily. 120 capsule 3  . ibuprofen (ADVIL,MOTRIN) 400 MG tablet Take 1 tablet (400 mg total) by mouth every 8 (eight) hours as needed. 15 tablet 0  . lisinopril-hydrochlorothiazide (PRINZIDE,ZESTORETIC) 20-25 MG tablet Take 1  tablet by mouth daily. 30 tablet 3  . omeprazole (PRILOSEC) 20 MG capsule Take 1 capsule (20 mg total) by mouth daily. 30 capsule 3   No facility-administered medications prior to visit.     ROS Review of Systems  Constitutional: Negative for activity change, appetite change and fatigue.  HENT: Positive for congestion and postnasal drip. Negative for sinus pressure and sore throat.   Eyes: Negative for visual disturbance.  Respiratory: Positive for cough. Negative for chest tightness, shortness of breath and wheezing.   Cardiovascular: Negative for chest pain and palpitations.  Gastrointestinal: Negative for abdominal distention, abdominal pain and constipation.  Endocrine: Negative for polydipsia.  Genitourinary: Negative for dysuria and frequency.  Musculoskeletal: Negative for arthralgias and back pain.  Skin: Negative for rash.  Neurological: Positive for numbness. Negative for tremors and light-headedness.  Hematological: Does not bruise/bleed easily.  Psychiatric/Behavioral: Negative for agitation and behavioral problems.    Objective:  BP 115/76   Pulse 81   Temp 98.2 F (36.8 C) (Oral)   Ht 5\' 1"  (1.549 m)   Wt 166 lb 9.6 oz (75.6 kg)   SpO2 95%   BMI 31.48 kg/m  BP/Weight 05/19/2017 01/18/2017 62/69/4854  Systolic BP 627 035 009  Diastolic BP 76 82 70  Wt. (Lbs) 166.6 158.4 156.5  BMI 31.48 29.93 29.57      Physical Exam  Constitutional: She is oriented to person, place, and time. She appears well-developed and well-nourished.  HENT:  Right Ear: External ear normal.  Left Ear: External ear normal.  Mouth/Throat: Oropharynx is clear and moist.  Ethmoidal sinus tenderness bilaterally  Cardiovascular: Normal rate, normal heart sounds and intact distal pulses.  No murmur heard. Pulmonary/Chest: Effort normal and breath sounds normal. She has no wheezes. She has no rales. She exhibits no tenderness.  Abdominal: Soft. Bowel sounds are normal. She exhibits no  distension and no mass. There is no tenderness.  Musculoskeletal: Normal range of motion. She exhibits tenderness (TTP on palpation of lumbar spine and positive straight leg raise bilaterally).  Neurological: She is alert and oriented to person, place, and time.  Psychiatric: She has a normal mood and affect.     Assessment & Plan:   1. Essential hypertension Controlled Counseled on blood pressure goal of less than 130/80, low-sodium, DASH diet, medication compliance, 150 minutes of moderate intensity exercise per week. Discussed medication compliance, adverse effects. - lisinopril-hydrochlorothiazide (PRINZIDE,ZESTORETIC) 20-25 MG tablet; Take 1 tablet by mouth daily.  Dispense: 30 tablet; Refill: 3  2. Neuropathy Stable - gabapentin (NEURONTIN) 300 MG capsule; Take 2 capsules (600 mg total) by mouth 2 (two) times daily.  Dispense: 120 capsule; Refill: 3  3. Gastroesophageal reflux disease, esophagitis presence not specified Controlled - omeprazole (PRILOSEC) 20 MG capsule; Take 1 capsule (20 mg total) by mouth daily.  Dispense: 30 capsule; Refill: 3  4. Acute non-recurrent ethmoidal sinusitis Placed on antibiotic as she is a high risk patient - fluticasone (FLONASE) 50 MCG/ACT nasal spray; Place 1 spray into both nostrils daily.  Dispense: 16 g; Refill: 1 - cetirizine (ZYRTEC) 10 MG tablet; Take 1 tablet (10 mg total) by mouth daily.  Dispense: 30 tablet; Refill: 1 - benzonatate (TESSALON) 100 MG capsule; Take 1 capsule (100 mg total) by mouth 2 (two) times daily as needed for cough.  Dispense: 20 capsule; Refill: 0 - amoxicillin (AMOXIL) 500 MG capsule; Take 1 capsule (500 mg total) by mouth 3 (three) times daily.  Dispense: 30 capsule; Refill: 0  5. Spinal stenosis of lumbar region with neurogenic claudication Uncontrolled She is requesting epidural spinal injection at a center where she can be sedated - Ambulatory referral to Spine Surgery - ibuprofen (ADVIL,MOTRIN) 600 MG  tablet; Take 1 tablet (600 mg total) by mouth every 8 (eight) hours as needed.  Dispense: 60 tablet; Refill: 2   Meds ordered this encounter  Medications  . gabapentin (NEURONTIN) 300 MG capsule    Sig: Take 2 capsules (600 mg total) by mouth 2 (two) times daily.    Dispense:  120 capsule    Refill:  3  . lisinopril-hydrochlorothiazide (PRINZIDE,ZESTORETIC) 20-25 MG tablet    Sig: Take 1 tablet by mouth daily.    Dispense:  30 tablet    Refill:  3  . omeprazole (PRILOSEC) 20 MG capsule    Sig: Take 1 capsule (20 mg total) by mouth daily.    Dispense:  30 capsule    Refill:  3  . fluticasone (FLONASE) 50 MCG/ACT nasal spray    Sig: Place 1 spray into both nostrils daily.    Dispense:  16 g    Refill:  1  . cetirizine (ZYRTEC) 10 MG tablet  Sig: Take 1 tablet (10 mg total) by mouth daily.    Dispense:  30 tablet    Refill:  1  . DISCONTD: ibuprofen (ADVIL,MOTRIN) 600 MG tablet    Sig: Take 1 tablet (600 mg total) by mouth every 8 (eight) hours as needed.    Dispense:  60 tablet    Refill:  2  . benzonatate (TESSALON) 100 MG capsule    Sig: Take 1 capsule (100 mg total) by mouth 2 (two) times daily as needed for cough.    Dispense:  20 capsule    Refill:  0  . amoxicillin (AMOXIL) 500 MG capsule    Sig: Take 1 capsule (500 mg total) by mouth 3 (three) times daily.    Dispense:  30 capsule    Refill:  0  . DISCONTD: ibuprofen (ADVIL,MOTRIN) 600 MG tablet    Sig: Take 1 tablet (600 mg total) by mouth every 8 (eight) hours as needed.    Dispense:  60 tablet    Refill:  2  . ibuprofen (ADVIL,MOTRIN) 600 MG tablet    Sig: Take 1 tablet (600 mg total) by mouth every 8 (eight) hours as needed.    Dispense:  60 tablet    Refill:  2    Follow-up: Return in about 3 months (around 08/16/2017) for Follow up of chronic medical conditions.   Charlott Rakes MD

## 2017-05-26 ENCOUNTER — Other Ambulatory Visit: Payer: Self-pay | Admitting: Radiation Oncology

## 2017-05-26 ENCOUNTER — Telehealth: Payer: Self-pay | Admitting: Radiation Oncology

## 2017-05-26 MED ORDER — HYDROMORPHONE HCL 4 MG PO TABS: 4.0000 mg | ORAL_TABLET | ORAL | 0 refills | Status: DC | PRN

## 2017-05-26 MED FILL — HYDROmorphone HCL 4 MG TABS: 4 | 4 days supply | Qty: 25 | Fill #0

## 2017-05-26 NOTE — Telephone Encounter (Signed)
-----   Message from Mallie Darting sent at 05/26/2017  2:54 PM EST ----- Regarding: FW: Pt asking for pain medication Contact: [phone]   ----- Message ----- From: Wilmon Arms, RN Sent: 05/26/2017   2:43 PM To: Effie Shy Nunnally Subject: RE: Pt asking for pain medication              Melanie,  i'm not covering Dr. Sondra Come today.  Please send to another member of the team down there.  Thanks, Shelle Iron ----- Message ----- From: Mallie Darting Sent: 05/26/2017   2:13 PM To: Wilmon Arms, RN Subject: Pt asking for pain medication                  Patient is calling requesting pain medication.  Thank you,  [employer] [phone]

## 2017-05-26 NOTE — Telephone Encounter (Signed)
Phoned patient back and explained that Dr. Lisbeth Renshaw refilled her Dilaudid. Patient understands she can pick up the medication at [facility] today before six. Encouraged patient to allow 48 hours for medication refills. Patient verbalized understanding.

## 2017-05-26 NOTE — Telephone Encounter (Signed)
Received inbasket message that patient is requesting a refill of her pain medication. Phoned patient to inquire. Patient requesting a refill of Dilaudid. Patient reports she is completely out and has no medication for the weekend.

## 2017-06-01 ENCOUNTER — Inpatient Hospital Stay: Payer: Medicaid Other | Attending: Gynecologic Oncology | Admitting: Gynecologic Oncology

## 2017-06-01 ENCOUNTER — Encounter: Payer: Self-pay | Admitting: Gynecologic Oncology

## 2017-06-01 VITALS — BP 149/82 | HR 97 | Temp 98.3°F | Resp 18 | Ht 61.5 in | Wt 165.2 lb

## 2017-06-01 DIAGNOSIS — C531 Malignant neoplasm of exocervix: Secondary | ICD-10-CM

## 2017-06-01 DIAGNOSIS — C539 Malignant neoplasm of cervix uteri, unspecified: Secondary | ICD-10-CM | POA: Diagnosis not present

## 2017-06-01 DIAGNOSIS — Z923 Personal history of irradiation: Secondary | ICD-10-CM | POA: Insufficient documentation

## 2017-06-01 DIAGNOSIS — Z9221 Personal history of antineoplastic chemotherapy: Secondary | ICD-10-CM | POA: Diagnosis not present

## 2017-06-01 NOTE — Progress Notes (Signed)
Consult Note: Gyn-Onc  Consult was requested by Dr. [name] for the evaluation of Valerie Kerr 57 y.o. female  CC:  Chief Complaint  Patient presents with  . Cervical Cancer    Assessment/Plan:  Valerie Kerr  is a 57 y.o.  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  Post treatment PET showed excellent response at cervix. No evidence of persistent disease on today's exam.  I will see her back in July, 2019 (she will see Dr Sondra Come in April, 2019) to follow-up the cervical cancer surveillance.  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  The patient reports last having a pap smear approximately 4 years ago in [place] which, was followed by a biopsy which, per patient, was "normal" and she was not informed that she would require special followup. She began experiencing postmenopausal bleeding at the beginning of 2016 and continued to bleed intermittently for 2 years.   She presented to the ER in [place] in November (25th), 2017 and a cervical mass was identified on pelvic exam.  She was seen in the office by Dr Baron Sane 03/01/16 who performed cervical biopsies of a friable mass which revealed poorly differentiated squamous cell carcinoma. Attempts were made to notify her of this result on 03/03/16, however a phone call was left to call back and there appears to be delay in her receiving the information about her result.  She presented to the office to be seen by Dr [name] for discussion regarding results on 03/16/16 and was informed of the results and the need to see oncology.   A PET had been ordered however, the patient cancelled this due to claustrophobia and fear of having a scan and getting bad results.  On 04/04/16 she  underwent pretreatment PET which showed 5.5 cm hypermetabolic cervical mass, consistent with known primary cervical carcinoma. No definite local or distant metastatic disease.Marland Kitchen  She was treated with primary radiation therapy and radiosensitizing chemotherapy with cisplatin between 04/12/16 and 06/20/16 with 45Gy to the pelvis with 9Gy boost, and 28 Gy additionally to the cervix with intracavitary brachytherapy.  Post treatment imaging on 06/29/16 showed excellent response at cervix but a new slightly enlarged PET avid right inguinal node was present.  Follow-up repeat PET on 10/20/16 showed complete resolution of PET avid findings at the cervix and inguinal node with no apparent malignant disease present.  Interval Hx:   Since completing therapy she reports profound weakness and fatigue, though this was present before her treatments and her lab work and exams do not show a clear organic explanation for these symptoms. It may be secondary to depression. She is tearful and depressed in her mood. She has severe back pain. She cannot work due to her symptoms.  Current Meds:  Outpatient Encounter Medications as of 06/01/2017  Medication Sig  . ALPRAZolam (XANAX) 0.25 MG tablet Take 1 tablet (0.25 mg total) by mouth 2 (two) times daily as needed for anxiety.  Marland Kitchen amoxicillin (AMOXIL) 500 MG capsule Take 1 capsule (500 mg total) by mouth 3 (three) times daily.  . benzonatate (TESSALON) 100 MG capsule Take 1 capsule (100 mg total) by mouth 2 (two) times daily as needed for cough.  . cetirizine (ZYRTEC) 10 MG tablet Take 1 tablet (10 mg total) by  mouth daily.  . fluticasone (FLONASE) 50 MCG/ACT nasal spray Place 1 spray into both nostrils daily.  Marland Kitchen gabapentin (NEURONTIN) 300 MG capsule Take 2 capsules (600 mg total) by mouth 2 (two) times daily.  Marland Kitchen HYDROmorphone (DILAUDID) 4 MG tablet Take 1 tablet (4 mg total) by mouth every 4 (four) hours as needed for severe pain.  Marland Kitchen ibuprofen (ADVIL,MOTRIN) 600 MG tablet Take  1 tablet (600 mg total) by mouth every 8 (eight) hours as needed.  Marland Kitchen lisinopril-hydrochlorothiazide (PRINZIDE,ZESTORETIC) 20-25 MG tablet Take 1 tablet by mouth daily.  . methadone (DOLOPHINE) 10 MG/5ML solution Take 55 mg by mouth daily.  . mirtazapine (REMERON) 15 MG tablet Take 1 tablet (15 mg total) by mouth at bedtime.  Marland Kitchen omeprazole (PRILOSEC) 20 MG capsule Take 1 capsule (20 mg total) by mouth daily.  . prochlorperazine (COMPAZINE) 10 MG tablet Take 1 tablet (10 mg total) by mouth every 6 (six) hours as needed for nausea.  Marland Kitchen VOLTAREN 1 % GEL Apply 4 g topically 4 (four) times daily.   No facility-administered encounter medications on file as of 06/01/2017.     Allergy:  Allergies  Allergen Reactions  . Codeine Itching and Rash  . Fentanyl Rash    Skin rash, local swelling from patch  . Hydrocodone-Acetaminophen Rash  . Tramadol Other (See Comments)    GI upset,also trembling sensation    Social Hx:   Social History   Socioeconomic History  . Marital status: Divorced    Spouse name: Not on file  . Number of children: 5  . Years of education: Not on file  . Highest education level: Not on file  Social Needs  . Financial resource strain: Not on file  . Food insecurity - worry: Not on file  . Food insecurity - inability: Not on file  . Transportation needs - medical: Not on file  . Transportation needs - non-medical: Not on file  Occupational History  . Occupation: disabled  Tobacco Use  . Smoking status: Current Every Day Smoker    Packs/day: 0.20    Years: 45.00    Pack years: 9.00    Types: Cigarettes  . Smokeless tobacco: Never Used  . Tobacco comment: 4 cigs daily  Substance and Sexual Activity  . Alcohol use: No  . Drug use: No  . Sexual activity: Not Currently  Other Topics Concern  . Not on file  Social History Narrative   Lives with niece in a 2 story home.  Has 5 children.  On disability.  Formerly worked in Actuary at [employer].  Education:  11th grade.     Past Surgical Hx:  Past Surgical History:  Procedure Laterality Date  . ABCESS DRAINAGE Left 1982   breast  . CARPAL TUNNEL RELEASE Bilateral   . CERVICAL DISC SURGERY  2010  . MULTIPLE EXTRACTIONS WITH ALVEOLOPLASTY N/A 04/08/2016   Procedure: Extraction of tooth #'s [id number] with alveoloplasty  and gross debridement of remaining teeth;  Surgeon: Lenn Cal, DDS;  Location: [facility];  Service: Oral Surgery;  Laterality: N/A;  . TANDEM RING INSERTION N/A 05/26/2016   Procedure: TANDEM RING INSERTION;  Surgeon: [name], MD;  Location: [facility];  Service: Urology;  Laterality: N/A;  . TANDEM RING INSERTION N/A 06/02/2016   Procedure: TANDEM RING INSERTION;  Surgeon: [name], MD;  Location: [facility];  Service: Urology;  Laterality: N/A;  . TANDEM RING INSERTION N/A 06/20/2016   Procedure: TANDEM RING INSERTION;  Surgeon: [name], MD;  Location: [facility];  Service: Urology;  Laterality: N/A;  . TANDEM RING INSERTION N/A 06/23/2016   Procedure: TANDEM RING INSERTION;  Surgeon: [name], MD;  Location: [facility];  Service: Urology;  Laterality: N/A;  . TANDEM RING INSERTION N/A 07/04/2016   Procedure: TANDEM RING INSERTION;  Surgeon: [name], MD;  Location: [facility];  Service: Urology;  Laterality: N/A;    Past Medical Hx:  Past Medical History:  Diagnosis Date  . Anxiety   . 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 Chemotherapy-induced thrombocytopenia   . Chronic pain disorder    back,neck-- chronic methadone  . Environmental allergies    allergy to dust mites  . GERD (gastroesophageal reflux  disease)   . Headache   . Herniated disc, cervical   . History of external beam radiation therapy 04/12/16-05/31/16   pelvis 45 Gy in 25 fractions, in  30 sessions, pelvis boost 9 Gy in 5 fractions  . History of radiation therapy 06/20/16-07/04/16   tandem and ring applicator to cervix 28 Gy in 5 fractions  . Hypertension   . Hypokalemia   . Hypomagnesemia    po supplement and IV replacement  . Leukopenia due to antineoplastic chemotherapy ([place])   . Lumbar herniated disc   . Pancytopenia due to chemotherapy ([place])   . Periodontitis     Past Gynecological History:  Denies history of abnormal paps. Last pap was 4 years ago in [place] per patient. SVD x 5 No LMP recorded. Patient is postmenopausal.  Family Hx:  Family History  Problem Relation Age of Onset  . Cancer Father   . Cancer Sister        throat  . Cancer Brother        throat and stomach  . Cancer Brother        lung    Review of Systems:  Constitutional  Feels fatigue, sadness, generalized weakness   ENT Normal appearing ears and nares bilaterally Skin/Breast  No rash, sores, jaundice, itching, dryness Cardiovascular  No chest pain, shortness of breath, or edema  Pulmonary  No cough or wheeze.  Gastro Intestinal  No nausea, vomitting, or diarrhoea. No bright red blood per rectum, no abdominal pain, change in bowel movement, + constipation.  Genito Urinary  No frequency, urgency, dysuria, . Musculo Skeletal  + myalgia, arthralgia, pain Neurologic  No weakness, numbness, change in gait,  Psychology  No depression, anxiety, insomnia.   Vitals:  Blood pressure (!) 149/82, pulse 97, temperature 98.3 F (36.8 C), temperature source Oral, resp. rate 18, height 5' 1.5" (1.562 m), weight 165 lb 3 oz (74.9 kg), SpO2 100 %.  Physical Exam: WD in NAD Neck  Supple NROM, without any enlargements.  Lymph Node Survey No cervical supraclavicular or inguinal adenopathy Cardiovascular  Pulse normal rate,  regularity and rhythm. S1 and S2 normal.  Lungs  Clear to auscultation bilateraly, without wheezes/crackles/rhonchi. Good air movement.  Skin  No rash/lesions/breakdown  Psychiatry  Alert and oriented to person, place, and time  Abdomen  Normoactive bowel sounds, abdomen soft, non-tender and nonobese without evidence of hernia.  Back No CVA tenderness Genito Urinary  Vulva/vagina: Normal external female genitalia.  No lesions. No  discharge or bleeding.  Bladder/urethra:  No lesions or masses, well supported bladder  Vagina: grossly normal  Cervix: The cervix is grossly normal. No visible or palpable residual lesion  Uterus:  Small, mobile,  Adnexa: no masses. Rectal  No lesions or masses Extremities  No bilateral cyanosis, clubbing or edema.   Thereasa Solo, MD  06/01/2017, 4:27 PM

## 2017-06-01 NOTE — Patient Instructions (Signed)
Please notify Dr [name] at phone number [phone] if you notice vaginal bleeding, new pelvic or abdominal pains, bloating, feeling full easy, or a change in bladder or bowel function.   Please return to see Dr Sondra Come as scheduled in April, 2019. Please contact Dr Serita Grit office in May, 2019 to schedule an appointment with her for July, 2019.

## 2017-06-06 IMAGING — CT NM PET TUM IMG RESTAG (PS) SKULL BASE T - THIGH
9 series · 25 of 25 positions shown · non-contrast
Comparison: Multiple exams, including 04/04/2016

CLINICAL DATA: Subsequent treatment strategy for cervical cancer.

EXAM:
NUCLEAR MEDICINE PET SKULL BASE TO THIGH
TECHNIQUE: 7.3 mCi F-18 FDG was injected intravenously. Full-ring PET imaging
was performed from the skull base to thigh after the radiotracer. CT
data was obtained and used for attenuation correction and anatomic
localization.
FASTING BLOOD GLUCOSE:  Value: 87 mg/dl

[Series 3: pet sk_thigh ac · axial · 5.0mm · 4.07mm/px · z∈[-790,-26]mm · 4 of 192 slices shown]
[im 1/192]
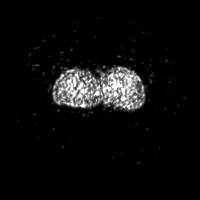
[im 64/192]
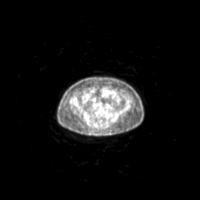
[im 128/192]
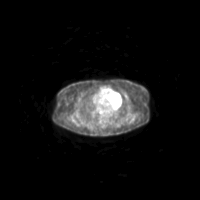
[im 192/192]
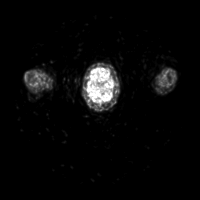

[Series 4: ct sk_thigh 5.0 b31f · axial · 5.0mm · 0.98mm/px · z∈[-790,-26]mm · 5 of 192 slices shown]
[im 1/192]
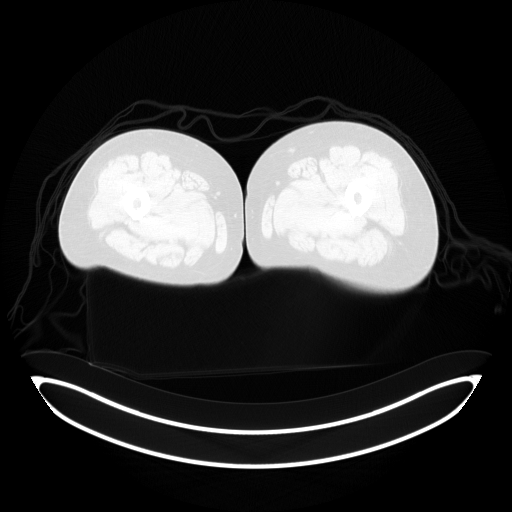
[im 48/192]
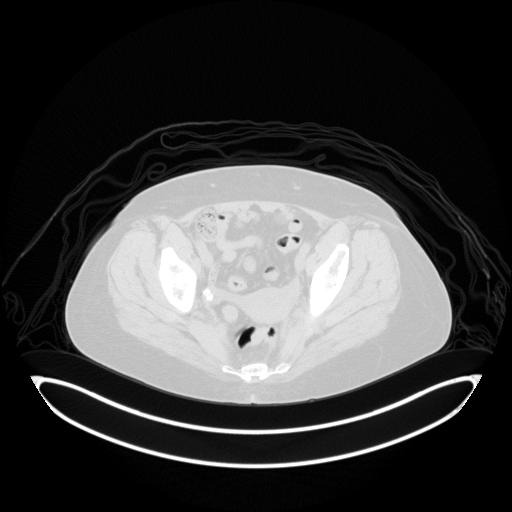
[im 96/192]
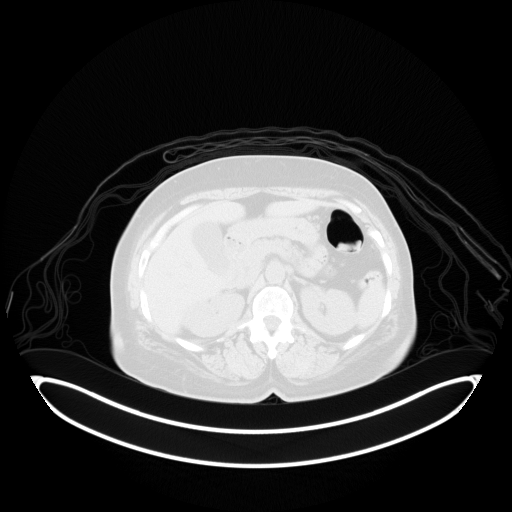
[im 144/192]
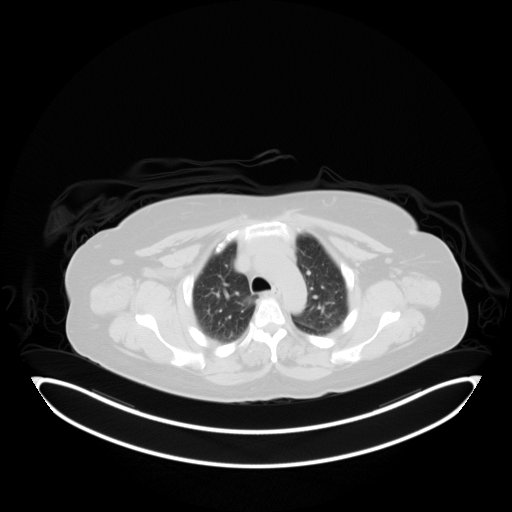
[im 192/192]
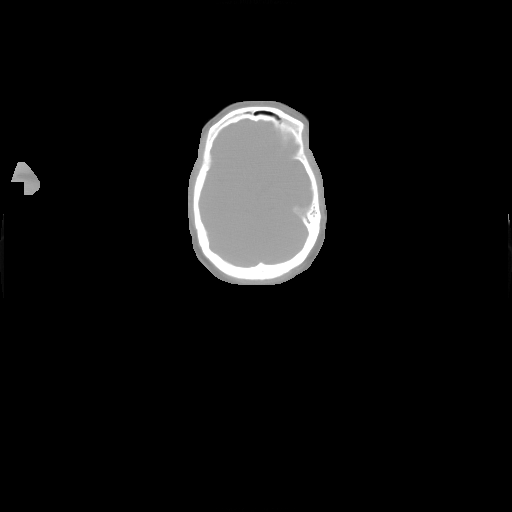

[Series 7: pet sk_thigh nac · axial · 5.0mm · 4.07mm/px · z∈[-790,-26]mm · 5 of 192 slices shown]
[im 1/192]
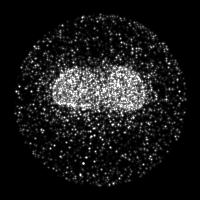
[im 48/192]
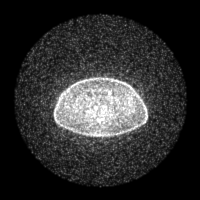
[im 96/192]
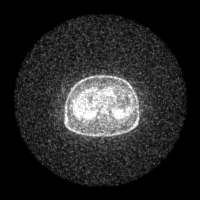
[im 144/192]
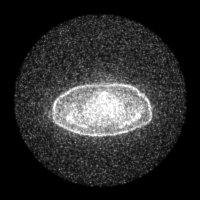
[im 192/192]
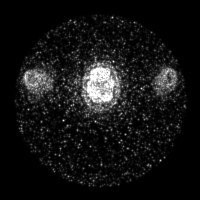

[Series 8: ct sk_thigh 5.0 (id) lung_bone · axial · 5.0mm · 0.57mm/px · 1 of 53 slices shown]
[im 1/53  bone]
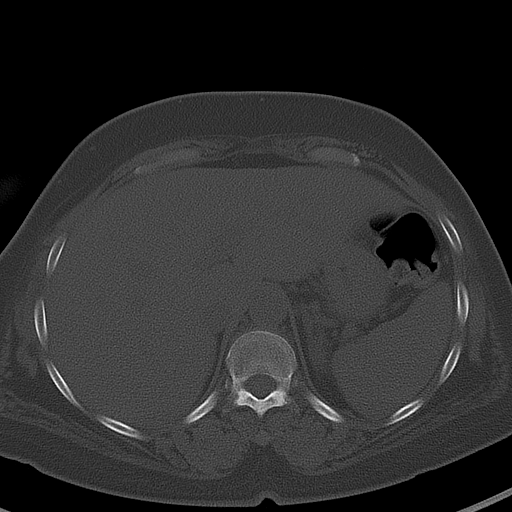

[Series 603: range-ct sk_thigh 5.0 (id)<alpha range> · 2 of 63 slices shown (1 of 2)]
[im 1/63]
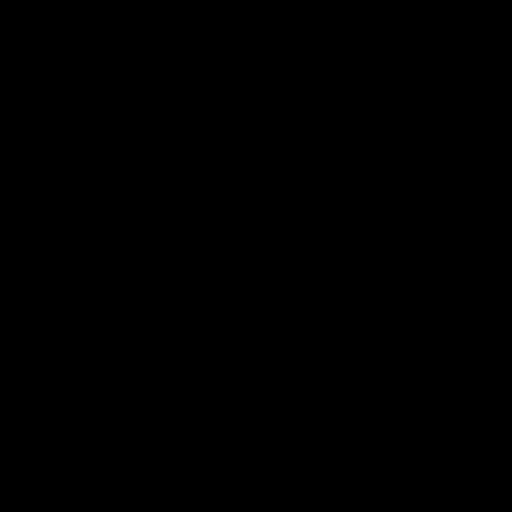
[im 63/63]
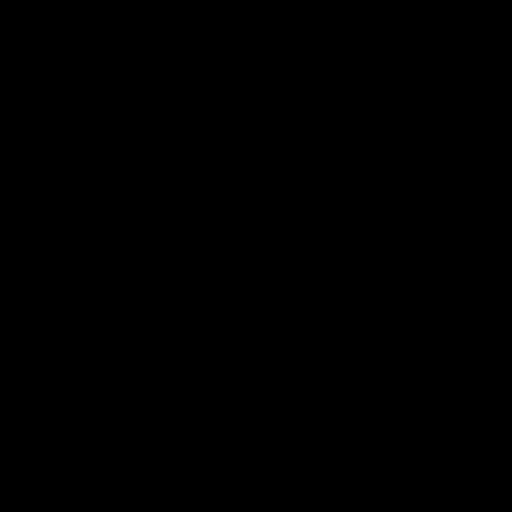

[Series 604: mip collection · coronal · 1.68mm/px · 1 of 32 slices shown]
[im 1/32]
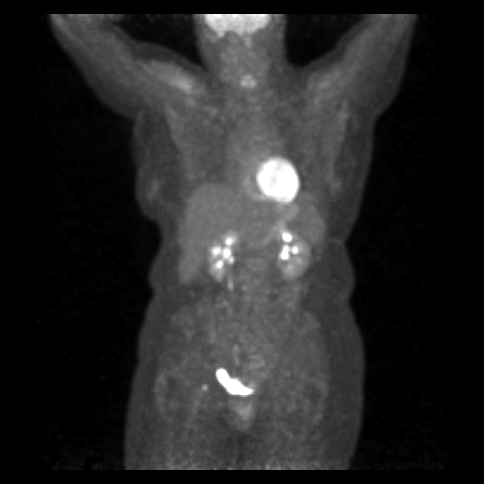

[Series 605: range-ct sk_thigh 5.0 (id)<alpha range> · 5 of 188 slices shown (2 of 2)]
[im 1/188]
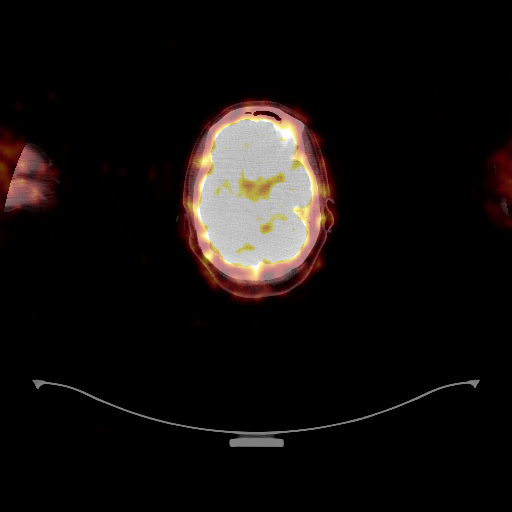
[im 47/188]
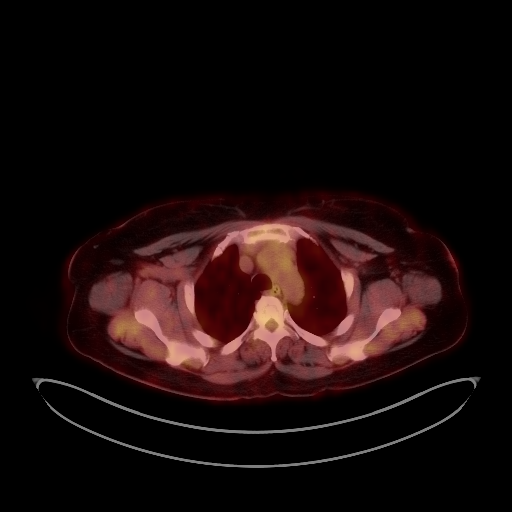
[im 94/188]
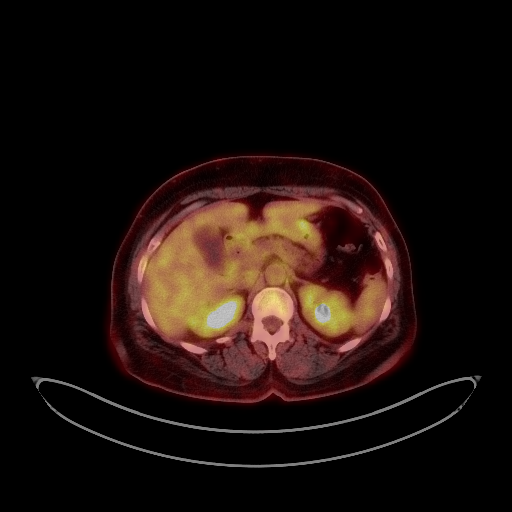
[im 141/188]
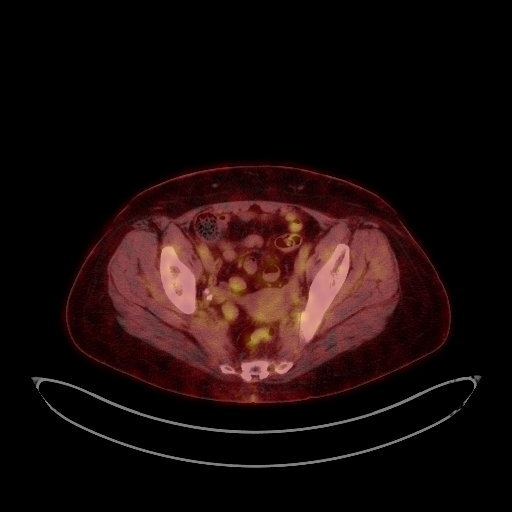
[im 188/188]
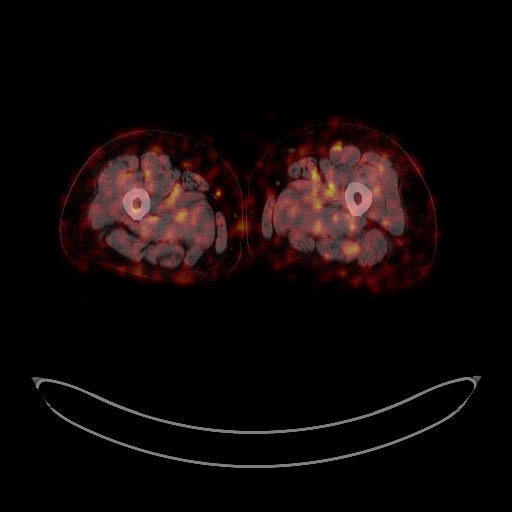

[Series 1086: results mm oncology reading · 5.0mm · 0.77mm/px · 1 of 2 slices shown (1 of 2)]
[im 1/2]
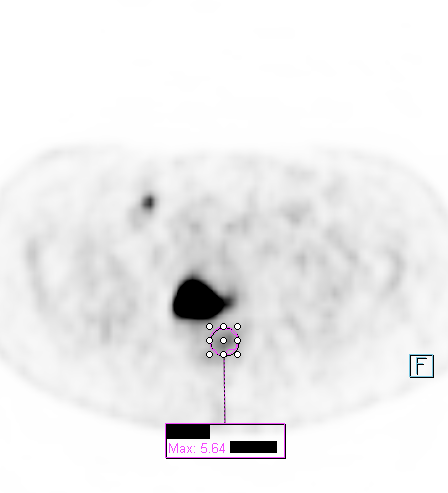

[Series 1194: results mm oncology reading · 5.0mm · 0.78mm/px · 1 of 2 slices shown (2 of 2)]
[im 1/2]
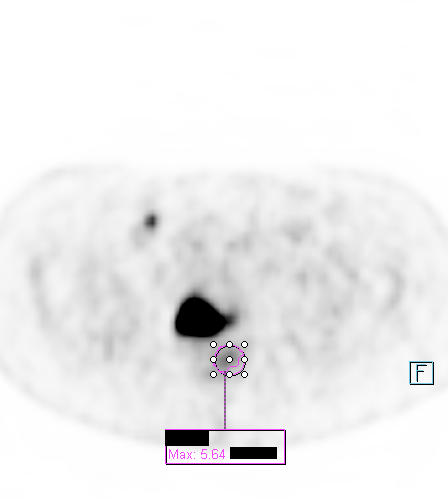

[25 of 25 positions shown; findings below may reference images not displayed]

FINDINGS: NECK

No hypermetabolic lymph nodes in the neck. Glottic activity is felt
to be physiologic.

Mucous retention cyst in the left maxillary sinus.

CHEST

Hypermetabolic activity in left axillary lymph nodes has resolved.
Of 1 of these nodes previously measured 7 mm in diameter and
currently measures 4 mm in diameter when measured in the same
fashion.

Mild atherosclerotic calcification of the aortic arch

ABDOMEN/PELVIS

The cervix no longer appears expanded and has a maximum SUV of
approximately 5.6, formerly 21.8. This is overall a dramatic
improvement in local disease. However, there is a suspected 7 mm
lymph node (previously 5 mm) at a vascular confluence along the
right inguinal chain, maximum SUV 6.4, this hypermetabolic activity
is new compared to the prior exam.

Aortoiliac atherosclerotic vascular disease.

2 mm left kidney upper pole nonobstructive renal calculus.

SKELETON

No focal hypermetabolic activity to suggest skeletal metastasis.

Spondylosis and degenerative disc disease at L4-5 and L5-S1.
IMPRESSION: 1. Generally markedly improved in appearance, with resolved masslike
appearance of the cervix and dramatic reduction in cervical
metabolic activity. Moreover, the left axillary nodes shown on the
prior exam are no longer hypermetabolic.
2. However, there is a new small hypermetabolic focus in the right
inguinal region corresponding to a suspected lymph node along a
vascular confluence, maximum SUV 6.4. This merits surveillance.
3. Other imaging findings of potential clinical significance: Mucous
retention cyst in the left maxillary sinus. Aortic atherosclerosis.
Nonobstructive left nephrolithiasis. Spondylosis and degenerative
disc disease in the lower lumbar spine.

## 2017-06-09 ENCOUNTER — Telehealth: Payer: Self-pay | Admitting: *Deleted

## 2017-06-09 NOTE — Telephone Encounter (Signed)
Called and gave the patient the phone number to [facility] [phone] per Dr. [name].

## 2017-06-15 ENCOUNTER — Telehealth: Payer: Self-pay | Admitting: Oncology

## 2017-06-15 NOTE — Telephone Encounter (Signed)
Patient left a message asking for a refill on pain medication - Dilaudid 4 mg tablets.  She last had it filled on 05/26/17.

## 2017-06-16 ENCOUNTER — Other Ambulatory Visit: Payer: Self-pay | Admitting: Radiation Oncology

## 2017-06-16 ENCOUNTER — Other Ambulatory Visit: Payer: Self-pay | Admitting: Family Medicine

## 2017-06-16 DIAGNOSIS — J0120 Acute ethmoidal sinusitis, unspecified: Secondary | ICD-10-CM

## 2017-06-16 MED ORDER — HYDROMORPHONE HCL 4 MG PO TABS: 4.0000 mg | ORAL_TABLET | ORAL | 0 refills | Status: DC | PRN

## 2017-06-19 ENCOUNTER — Telehealth: Payer: Self-pay | Admitting: Oncology

## 2017-06-19 NOTE — Telephone Encounter (Signed)
Called patient and let her know the refill of dilaudid was sent to her pharmacy on Friday.  She verbalized agreement and also verified her appointment time on 06/26/17 at 10 am with Dr. Sondra Come.

## 2017-06-21 ENCOUNTER — Other Ambulatory Visit: Payer: Self-pay

## 2017-06-21 ENCOUNTER — Encounter (HOSPITAL_COMMUNITY): Payer: Self-pay | Admitting: Emergency Medicine

## 2017-06-21 ENCOUNTER — Emergency Department (HOSPITAL_COMMUNITY)
Admission: EM | Admit: 2017-06-21 | Discharge: 2017-06-22 | Disposition: A | Discharge status: Home / Self Care | Payer: Medicaid Other | Attending: Emergency Medicine | Admitting: Emergency Medicine

## 2017-06-21 DIAGNOSIS — M5442 Lumbago with sciatica, left side: Secondary | ICD-10-CM | POA: Diagnosis not present

## 2017-06-21 DIAGNOSIS — M5441 Lumbago with sciatica, right side: Secondary | ICD-10-CM | POA: Insufficient documentation

## 2017-06-21 DIAGNOSIS — G8929 Other chronic pain: Secondary | ICD-10-CM | POA: Insufficient documentation

## 2017-06-21 DIAGNOSIS — Z8541 Personal history of malignant neoplasm of cervix uteri: Secondary | ICD-10-CM | POA: Diagnosis not present

## 2017-06-21 DIAGNOSIS — M545 Low back pain: Secondary | ICD-10-CM | POA: Diagnosis present

## 2017-06-21 DIAGNOSIS — F17210 Nicotine dependence, cigarettes, uncomplicated: Secondary | ICD-10-CM | POA: Diagnosis not present

## 2017-06-21 DIAGNOSIS — Z79899 Other long term (current) drug therapy: Secondary | ICD-10-CM | POA: Insufficient documentation

## 2017-06-21 DIAGNOSIS — I10 Essential (primary) hypertension: Secondary | ICD-10-CM | POA: Insufficient documentation

## 2017-06-21 NOTE — ED Triage Notes (Signed)
Pt reports bilateral leg pain, lower back pain X few days. Hx sciatica.

## 2017-06-22 ENCOUNTER — Emergency Department (HOSPITAL_COMMUNITY): Payer: Medicaid Other

## 2017-06-22 MED ORDER — CYCLOBENZAPRINE HCL 5 MG PO TABS: 5.0000 mg | ORAL_TABLET | Freq: Two times a day (BID) | ORAL | 0 refills | Status: AC | PRN

## 2017-06-22 MED ORDER — CYCLOBENZAPRINE HCL 10 MG PO TABS
10.0000 mg | ORAL_TABLET | Freq: Once | ORAL | Status: AC
  Administered 2017-06-22: 10 mg via ORAL
  Filled 2017-06-22: qty 1

## 2017-06-22 MED ORDER — PREDNISONE 20 MG PO TABS: 40.0000 mg | ORAL_TABLET | Freq: Every day | ORAL | 0 refills | Status: AC

## 2017-06-22 NOTE — Discharge Instructions (Signed)
Take your pain medications as directed.   You can use flexeril for your pain. Take Flexeril as prescribed. This medication will make you drowsy so do not drive or drink alcohol when taking it. Do not take it while taking your regular pain medicatoins.   Follow-up with the referred neurosurgeon. Call their office and arrange an appointment.   Return to the Emergency Department immediately for any worsening back pain, neck pain, difficulty walking, numbness/weaknss of your arms or legs, urinary or bowel accidents, fever or any other worsening or concerning symptoms.

## 2017-06-22 NOTE — ED Provider Notes (Signed)
[facility] [place] EMERGENCY DEPARTMENT Provider Note   CSN: [id number] Arrival date & time: 06/21/17  2048     History   Chief Complaint Chief Complaint  Patient presents with  . Back Pain    HPI Valerie Kerr is a 57 y.o. female past medical history of cervical cancer that is currently in remission, chronic back pain, GERD, hypertension who presents for evaluation of chronic lower back pain, bilateral lower leg pain.  Patient reports that she has a history of sciatica and feels that she is currently undergoing a sciatica flare.  Patient reports that she has been having pain intermittently for the last several weeks became more constant over the last few days.  She reports no preceding trauma, injury, fall.  Patient reports that she has been taking her pain medications, including Dilaudid, with minimal improvement in symptoms.  Patient reports she is able to ambulate with the assistance of a cane, which is her baseline, but does report that pain is worsened with ambulation.  Patient states that she was referred to a neurosurgeon's office for epidural injections but states that she needs a referral was sedated.  Patient reports that she has a history of cervical cancer that was diagnosed in 2015.  Patient has been in remission since 2018.  She reports that she has been seen by her oncologist and reports no current cancer diagnosis.  Patient reports that she has had intermittent numbness/tingling sensation to the left lower extremities.  She reports that over the last few days she has intermittently had these symptoms but states that it is not new.  Patient currently denying any numbness at this time. Denies fevers, weight loss, new numbness/weakness of upper and lower extremities, bowel/bladder incontinence, saddle anesthesia, history of back surgery, history of IVDA.    The history is provided by the patient.    Past Medical History:  Diagnosis Date  . Anxiety   . 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 Chemotherapy-induced thrombocytopenia   . Chronic pain disorder    back,neck-- chronic methadone  . Environmental allergies    allergy to dust mites  . GERD (gastroesophageal reflux disease)   . Headache   . Herniated disc, cervical   . History of external beam radiation therapy 04/12/16-05/31/16   pelvis 45 Gy in 25 fractions, in  30 sessions, pelvis boost 9 Gy in 5 fractions  . History of radiation therapy 06/20/16-07/04/16   tandem and ring applicator to cervix 28 Gy in 5 fractions  . Hypertension   . Hypokalemia   . Hypomagnesemia    po supplement and IV replacement  . Leukopenia due to antineoplastic chemotherapy ([place])   . Lumbar herniated disc   . Pancytopenia due to chemotherapy ([place])   . Periodontitis     Patient Active Problem List   Diagnosis Date Noted  . Spinal stenosis 01/18/2017  . Insomnia 01/18/2017  . Lumbar herniated disc 09/05/2016  . Cervical radiculopathy 09/05/2016  . Neuropathy 09/05/2016  . Depression 08/15/2016  . Inguinal lymphadenopathy 06/30/2016  . Thrombocytopenia ([place]) 05/24/2016  . Dehydration 05/24/2016  . Anxiety 05/24/2016  . Nausea and vomiting 05/17/2016  . Leukopenia due to antineoplastic chemotherapy ([place]) 05/12/2016  . Hypomagnesemia 05/12/2016  . Hypokalemia 05/12/2016  . Anemia due  to antineoplastic chemotherapy 05/04/2016  . Continuous tobacco abuse 04/01/2016  . Poor dentition 04/01/2016  . Essential hypertension 04/01/2016  . 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 Allergic sinusitis 04/01/2016  . Gastroesophageal reflux disease 04/01/2016  . Cervical cancer ([place]) 03/03/2016    Past Surgical History:  Procedure Laterality Date  .  ABCESS DRAINAGE Left 1982   breast  . CARPAL TUNNEL RELEASE Bilateral   . CERVICAL DISC SURGERY  2010  . MULTIPLE EXTRACTIONS WITH ALVEOLOPLASTY N/A 04/08/2016   Procedure: Extraction of tooth #'s [phone] with alveoloplasty  and gross debridement of remaining teeth;  Surgeon: Lenn Cal, DDS;  Location: [facility];  Service: Oral Surgery;  Laterality: N/A;  . TANDEM RING INSERTION N/A 05/26/2016   Procedure: TANDEM RING INSERTION;  Surgeon: [name], MD;  Location: [facility];  Service: Urology;  Laterality: N/A;  . TANDEM RING INSERTION N/A 06/02/2016   Procedure: TANDEM RING INSERTION;  Surgeon: [name], MD;  Location: [facility];  Service: Urology;  Laterality: N/A;  . TANDEM RING INSERTION N/A 06/20/2016   Procedure: TANDEM RING INSERTION;  Surgeon: [name], MD;  Location: [facility];  Service: Urology;  Laterality: N/A;  . TANDEM RING INSERTION N/A 06/23/2016   Procedure: TANDEM RING INSERTION;  Surgeon: [name], MD;  Location: [facility];  Service: Urology;  Laterality: N/A;  . TANDEM RING INSERTION N/A 07/04/2016   Procedure: TANDEM RING INSERTION;  Surgeon: [name], MD;  Location: [facility];  Service: Urology;  Laterality: N/A;     OB History   None      Home Medications    Prior to Admission medications   Medication Sig Start Date End Date Taking? Authorizing Provider  ALPRAZolam (XANAX) 0.25 MG tablet Take 1 tablet (0.25 mg total) by mouth 2 (two) times daily as needed for anxiety. 04/27/17   [name], MD  amoxicillin (AMOXIL) 500 MG capsule Take 1 capsule (500 mg total) by mouth 3 (three) times daily. 05/19/17   Charlott Rakes, MD  benzonatate (TESSALON) 100 MG capsule Take 1 capsule (100 mg total) by mouth 2 (two) times daily as needed for cough. 05/19/17   Charlott Rakes, MD  cetirizine (ZYRTEC) 10 MG tablet Take 1 tablet (10 mg total) by mouth daily. 05/19/17    Charlott Rakes, MD  cyclobenzaprine (FLEXERIL) 5 MG tablet Take 1 tablet (5 mg total) by mouth 2 (two) times daily as needed for up to 3 days for muscle spasms. 06/22/17 06/25/17  Volanda Napoleon, PA-C  fluticasone (FLONASE) 50 MCG/ACT nasal spray Place 1 spray into both nostrils daily. 05/19/17   Charlott Rakes, MD  gabapentin (NEURONTIN) 300 MG capsule Take 2 capsules (600 mg total) by mouth 2 (two) times daily. 05/19/17   Charlott Rakes, MD  HYDROmorphone (DILAUDID) 4 MG tablet Take 1 tablet (4 mg total) by mouth every 4 (four) hours as needed for severe pain. 06/16/17   [name], MD  ibuprofen (ADVIL,MOTRIN) 600 MG tablet Take 1 tablet (600 mg total) by mouth every 8 (eight) hours as needed. 05/19/17   Charlott Rakes, MD  lisinopril-hydrochlorothiazide (PRINZIDE,ZESTORETIC) 20-25 MG tablet Take 1 tablet by mouth daily. 05/19/17   Charlott Rakes, MD  methadone (DOLOPHINE) 10 MG/5ML solution Take 55 mg by mouth daily.    [provider]  mirtazapine (REMERON) 15 MG tablet Take 1 tablet (15 mg total) by mouth at bedtime. 01/18/17   Charlott Rakes, MD  omeprazole (PRILOSEC)  20 MG capsule Take 1 capsule (20 mg total) by mouth daily. 05/19/17   Charlott Rakes, MD  predniSONE (DELTASONE) 20 MG tablet Take 2 tablets (40 mg total) by mouth daily for 4 days. 06/22/17 06/26/17  Volanda Napoleon, PA-C  prochlorperazine (COMPAZINE) 10 MG tablet Take 1 tablet (10 mg total) by mouth every 6 (six) hours as needed for nausea. 04/27/17   [name], MD  VOLTAREN 1 % GEL Apply 4 g topically 4 (four) times daily. 03/31/17   Charlott Rakes, MD    Family History Family History  Problem Relation Age of Onset  . Cancer Father   . Cancer Sister        throat  . Cancer Brother        throat and stomach  . Cancer Brother        lung    Social History Social History   Tobacco Use  . Smoking status: Current Every Day Smoker    Packs/day: 0.20    Years: 45.00    Pack years: 9.00    Types:  Cigarettes  . Smokeless tobacco: Never Used  . Tobacco comment: 4 cigs daily  Substance Use Topics  . Alcohol use: No  . Drug use: No     Allergies   Codeine; Fentanyl; Hydrocodone-acetaminophen; and Tramadol   Review of Systems Review of Systems  Constitutional: Negative for fever.  Respiratory: Negative for cough and shortness of breath.   Cardiovascular: Negative for chest pain.  Gastrointestinal: Negative for abdominal pain, nausea and vomiting.  Genitourinary: Negative for difficulty urinating.  Musculoskeletal: Positive for back pain (chronic). Negative for neck pain.  Neurological: Positive for numbness (intermittnetly chronic). Negative for weakness and headaches.  All other systems reviewed and are negative.    Physical Exam Updated Vital Signs BP (!) 153/84 (BP Location: Right Arm)   Pulse 81   Temp 98.2 F (36.8 C) (Oral)   Resp 18   Ht 5\' 1"  (1.549 m)   Wt 74.8 kg (165 lb)   SpO2 98%   BMI 31.18 kg/m   Physical Exam  Constitutional: She is oriented to person, place, and time. She appears well-developed and well-nourished.  HENT:  Head: Normocephalic and atraumatic.  Mouth/Throat: Oropharynx is clear and moist and mucous membranes are normal.  Eyes: Pupils are equal, round, and reactive to light. Conjunctivae, EOM and lids are normal.  Neck: Full passive range of motion without pain.  Full flexion/extension and lateral movement of neck fully intact. No bony midline tenderness. No deformities or crepitus.   Cardiovascular: Normal rate, regular rhythm, normal heart sounds and normal pulses. Exam reveals no gallop and no friction rub.  No murmur heard. Pulses:      Radial pulses are 2+ on the right side, and 2+ on the left side.       Dorsalis pedis pulses are 2+ on the right side, and 2+ on the left side.  Pulmonary/Chest: Effort normal and breath sounds normal.  Abdominal: Soft. Normal appearance. There is no tenderness. There is no rigidity and no  guarding.  Musculoskeletal: Normal range of motion.       Arms: Diffuse muscular tenderness overlying the entire lumbar region.  No deformities or crepitus noted.  Neurological: She is alert and oriented to person, place, and time.  Follows commands, Moves all extremities  5/5 strength to BUE and BLE  Sensation intact throughout all major nerve distributions.  Patient able to differentiate between sharp and dull through all major nerve  distributions bilateral lower extremities. Normal gait Positive SLR bilaterally, L>R  Skin: Skin is warm and dry. Capillary refill takes less than 2 seconds.  Psychiatric: She has a normal mood and affect. Her speech is normal.  Nursing note and vitals reviewed.    ED Treatments / Results  Labs (all labs ordered are listed, but only abnormal results are displayed) Labs Reviewed - No data to display  EKG None  Radiology Dg Lumbar Spine Complete  Result Date: 06/22/2017 CLINICAL DATA:  57 year old female with lumbar back pain radiating down both legs with no known injury. Personal history of cervical cancer and prior radiation treatment. EXAM: LUMBAR SPINE - COMPLETE 4+ VIEW COMPARISON:  Lumbar MRI 10/09/2016. FINDINGS: Normal lumbar segmentation, the same numbering system on the 2018 MRI. Mild grade 1 anterolisthesis of L3 on L4 appears stable along with other vertebral height and alignment. Chronic disc and endplate degeneration at L4-L5. Mild lower lumbar facet hypertrophy. No pars fracture. No acute osseous abnormality identified. Sacral ala and SI joints appear intact. Calcified aortic atherosclerosis. Negative visible bowel gas pattern. IMPRESSION: 1.  No acute osseous abnormality identified in lumbar spine. 2. Chronic lower lumbar disc and facet degeneration. 3.  Aortic Atherosclerosis (ICD10-I70.0). Electronically Signed   By: Genevie Ann M.D.   On: 06/22/2017 00:45    Procedures Procedures (including critical care time)  Medications Ordered in  ED Medications  cyclobenzaprine (FLEXERIL) tablet 10 mg (10 mg Oral Given 06/22/17 0108)     Initial Impression / Assessment and Plan / ED Course  I have reviewed the triage vital signs and the nursing notes.  Pertinent labs & imaging results that were available during my care of the patient were reviewed by me and considered in my medical decision making (see chart for details).     57 year old female past medical history of cervical cancer, in remission, chronic back pain who presents for evaluation of back pain.  Reports intermittent back pain but worsened over the last few days.  No preceding trauma, injury, fall.  Patient reports she has had intermittent numbness/tingling sensation to bilateral lower extremities that is been ongoing for several years.  Patient reports that she has not had any new or changes in numbness/weakness.  Patient does have a history of cervical cancer that was diagnosed in 2017.  Patient has been in remission since 2018.  Her most recent oncology visit was in March 2019 and showed no current cancer diagnosis.  Consider chronic back pain versus sciatica.  History/physical exam is not concerning for spinal abscess, cauda equina.  Will plan for x-ray evaluation given patient's history of cancer to evaluate for any metastatic disease.  Plan to give analgesics here in the department.  X-rays reviewed.  Negative for any acute abnormalities.  Discussed results with patient.  Patient able to ambulate with my assistance.  She states that this is baseline for her as she normally uses a cane to help walk.  We will plan to give patient outpatient neurosurgery referral for further evaluation.  Plan to start patient on short course of prednisone to help with sciatica symptoms. Patient had ample opportunity for questions and discussion. All patient's questions were answered with full understanding. Strict return precautions discussed. Patient expresses understanding and agreement to  plan.   Final Clinical Impressions(s) / ED Diagnoses   Final diagnoses:  Chronic bilateral low back pain with bilateral sciatica    ED Discharge Orders        Ordered    cyclobenzaprine (FLEXERIL)  5 MG tablet  2 times daily PRN     06/22/17 0147    predniSONE (DELTASONE) 20 MG tablet  Daily     06/22/17 0147       Volanda Napoleon, PA-C 06/22/17 0220    Ward, Delice Bison, DO 06/22/17 0230

## 2017-06-26 ENCOUNTER — Encounter: Payer: Self-pay | Admitting: Radiation Oncology

## 2017-06-26 ENCOUNTER — Other Ambulatory Visit: Payer: Self-pay

## 2017-06-26 ENCOUNTER — Telehealth: Payer: Self-pay | Admitting: Oncology

## 2017-06-26 ENCOUNTER — Ambulatory Visit
Admission: RE | Admit: 2017-06-26 | Discharge: 2017-06-26 | Disposition: A | Discharge status: Home / Self Care | Payer: Medicaid Other | Source: Ambulatory Visit | Attending: Radiation Oncology | Admitting: Radiation Oncology

## 2017-06-26 VITALS — BP 142/88 | HR 97 | Temp 98.4°F | Resp 18 | Wt 169.0 lb

## 2017-06-26 DIAGNOSIS — M545 Low back pain: Secondary | ICD-10-CM | POA: Insufficient documentation

## 2017-06-26 DIAGNOSIS — I700 Atherosclerosis of aorta: Secondary | ICD-10-CM | POA: Insufficient documentation

## 2017-06-26 DIAGNOSIS — Z923 Personal history of irradiation: Secondary | ICD-10-CM | POA: Diagnosis not present

## 2017-06-26 DIAGNOSIS — C530 Malignant neoplasm of endocervix: Secondary | ICD-10-CM | POA: Diagnosis present

## 2017-06-26 DIAGNOSIS — Z885 Allergy status to narcotic agent status: Secondary | ICD-10-CM | POA: Insufficient documentation

## 2017-06-26 DIAGNOSIS — Z791 Long term (current) use of non-steroidal anti-inflammatories (NSAID): Secondary | ICD-10-CM | POA: Insufficient documentation

## 2017-06-26 DIAGNOSIS — Z79899 Other long term (current) drug therapy: Secondary | ICD-10-CM | POA: Diagnosis not present

## 2017-06-26 DIAGNOSIS — Z888 Allergy status to other drugs, medicaments and biological substances status: Secondary | ICD-10-CM | POA: Insufficient documentation

## 2017-06-26 MED ORDER — HYDROMORPHONE HCL 4 MG PO TABS: 4.0000 mg | ORAL_TABLET | ORAL | 0 refills | Status: DC | PRN

## 2017-06-26 NOTE — Progress Notes (Signed)
Radiation Oncology         [phone]) [phone] ________________________________  Name: Valerie Kerr MRN: [id number]  Date: 06/26/2017  DOB: 1960/07/04  Follow-Up Visit Note  CC: Charlott Rakes, MD  [name], MD    ICD-10-CM   1. 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   Interval Since Last Radiation:  1 year  Indication for treatment:  Curative, along with radiosensitizing chemotherapy       Radiation treatment dates:  04/12/16-05/31/16                                                 06/20/16-07/04/16  Site/dose:  1) Pelvis/ 45 Gy in 25 fractions                         2) Pelvis Boost/ 9 Gy in 5 fractions                         3) Cervix / 28 Gy in 5 fractions  Beams/energy:  1) 3D / 15X                                     2) Isodose plan/ 15X                                     3) HDR Ir-192 Cervix / Iridium HDR, tandem/ring applicator  Narrative:  The patient returns today for routine follow-up. The patient was last seen by Dr. [name] on 06/01/17. Per her note, there was no evidence of recurrence on clinical exam. The patient presents today with complaints of 10/10 pain to the lower back that radiates to her legs. She reports leg weakness. She reports prednisone did help ease her back pain along with Dilaudid and Ibuprofen. Patient reports she will present to a neurosurgeon on 07/03/17. She denies dysuria, hematuria, vaginal discharge, odor, itching, urinary urgency, or leakage. She notes nocturia x 3-4. She also reports mild fatigue related to poor sleeping. The patient denies using her vaginal dilator. She reports she has lost her dilator and she is interested in another one.                  ALLERGIES:  is allergic to codeine; fentanyl; hydrocodone-acetaminophen; and tramadol.  Meds: Current Outpatient Medications  Medication Sig Dispense Refill  . ALPRAZolam (XANAX) 0.25 MG  tablet Take 1 tablet (0.25 mg total) by mouth 2 (two) times daily as needed for anxiety. 30 tablet 0  . benzonatate (TESSALON) 100 MG capsule Take 1 capsule (100 mg total) by mouth 2 (two) times daily as needed for cough. 20 capsule 0  . cetirizine (ZYRTEC) 10 MG tablet Take 1 tablet (10 mg total) by mouth daily. 30 tablet 1  . cyclobenzaprine (FLEXERIL) 5 MG tablet Take 1 tablet (5 mg total) by mouth 2 (two) times daily as needed for up to 3 days for muscle spasms. 6 tablet 0  . fluticasone (FLONASE) 50 MCG/ACT nasal spray Place 1 spray into both nostrils daily. 16 g 1  . gabapentin (  NEURONTIN) 300 MG capsule Take 2 capsules (600 mg total) by mouth 2 (two) times daily. 120 capsule 3  . HYDROmorphone (DILAUDID) 4 MG tablet Take 1 tablet (4 mg total) by mouth every 4 (four) hours as needed for severe pain. 25 tablet 0  . ibuprofen (ADVIL,MOTRIN) 600 MG tablet Take 1 tablet (600 mg total) by mouth every 8 (eight) hours as needed. 60 tablet 2  . lisinopril-hydrochlorothiazide (PRINZIDE,ZESTORETIC) 20-25 MG tablet Take 1 tablet by mouth daily. 30 tablet 3  . methadone (DOLOPHINE) 10 MG/5ML solution Take 60 mg by mouth daily.     Marland Kitchen omeprazole (PRILOSEC) 20 MG capsule Take 1 capsule (20 mg total) by mouth daily. 30 capsule 3  . predniSONE (DELTASONE) 20 MG tablet Take 2 tablets (40 mg total) by mouth daily for 4 days. 8 tablet 0  . prochlorperazine (COMPAZINE) 10 MG tablet Take 1 tablet (10 mg total) by mouth every 6 (six) hours as needed for nausea. 20 tablet 0  . amoxicillin (AMOXIL) 500 MG capsule Take 1 capsule (500 mg total) by mouth 3 (three) times daily. (Patient not taking: Reported on 06/26/2017) 30 capsule 0  . mirtazapine (REMERON) 15 MG tablet Take 1 tablet (15 mg total) by mouth at bedtime. (Patient not taking: Reported on 06/26/2017) 30 tablet 3  . VOLTAREN 1 % GEL Apply 4 g topically 4 (four) times daily. (Patient not taking: Reported on 06/26/2017) 100 g 0   No current facility-administered  medications for this encounter.     Physical Findings: The patient is in no acute distress. Patient is alert and oriented.  weight is 169 lb (76.7 kg). Her oral temperature is 98.4 F (36.9 C). Her blood pressure is 142/88 (abnormal) and her pulse is 97. Her respiration is 18 and oxygen saturation is 98%. .  Lungs are clear to auscultation bilaterally. Heart has regular rate and rhythm. No palpable cervical, supraclavicular, or axillary adenopathy. Abdomen soft, non-tender, normal bowel sounds. On pelvic examination the external genitalia were unremarkable. A speculum exam was performed. Cervical os is identified no mucosal lesions noted at the cervical or vaginal vault. On bimanual and rectovaginal examination the cervix palpates normal in size. Rectal sphincter tone good.    Lab Findings: Lab Results  Component Value Date   WBC 3.5 (L) 08/15/2016   HGB 10.9 (L) 08/15/2016   HCT 34.1 (L) 08/15/2016   MCV 93.2 08/15/2016   PLT 196 08/15/2016    Radiographic Findings: Dg Lumbar Spine Complete  Result Date: 06/22/2017 CLINICAL DATA:  57 year old female with lumbar back pain radiating down both legs with no known injury. Personal history of cervical cancer and prior radiation treatment. EXAM: LUMBAR SPINE - COMPLETE 4+ VIEW COMPARISON:  Lumbar MRI 10/09/2016. FINDINGS: Normal lumbar segmentation, the same numbering system on the 2018 MRI. Mild grade 1 anterolisthesis of L3 on L4 appears stable along with other vertebral height and alignment. Chronic disc and endplate degeneration at L4-L5. Mild lower lumbar facet hypertrophy. No pars fracture. No acute osseous abnormality identified. Sacral ala and SI joints appear intact. Calcified aortic atherosclerosis. Negative visible bowel gas pattern. IMPRESSION: 1.  No acute osseous abnormality identified in lumbar spine. 2. Chronic lower lumbar disc and facet degeneration. 3.  Aortic Atherosclerosis (ICD10-I70.0). Electronically Signed   By: Genevie Ann M.D.    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 No evidence of persistent or recurrent disease on clinical exam today.  Plan:  Routine follow-up in radiation oncology in 6 months. The patient will see Dr. [name] in 3 months. The patient was given additional vaginal dilators as she misplaced her other dilators. The patient continues to have severe low back pain requiring methadone and dilaudid. She is scheduled to meet with a neurosurgeon later this month for further evaluation. In light of her continued back pain, I have given her a refill on her Dilaudid. She reports taking these 1-2 times per day.  ____________________________________  This document serves as a record of services personally performed by [name], MD. It was created on his behalf by Bethann Humble, a trained medical scribe. The creation of this record is based on the scribe's personal observations and the provider's statements to them. This document has been checked and approved by the attending provider.

## 2017-06-26 NOTE — Telephone Encounter (Signed)
Patient called and said her transportation went to [facility] instead of [facility] so she will be late for her appointment today.

## 2017-06-26 NOTE — Progress Notes (Signed)
Valerie Kerr is here for her follow-up appointment . She was evaluated by Dr. [name] on 06/01/2017. Weight and vitals stable. Reports low back pain that radiates to her legs 10/10. Reports the prednisone did help ease her back pain along with Dilaudid and Ibuprofen. Denies dysuria or hematuria. Denies vaginal discharge, odor or itching. Reports nocturia x 3-4. Denies urinary urgency or leakage. Reports constipation. Reports mild fatigue related to keeping grandchildren and poor sleep patterns.

## 2017-07-07 ENCOUNTER — Telehealth: Payer: Self-pay | Admitting: Oncology

## 2017-07-07 NOTE — Telephone Encounter (Signed)
Patient called and requested a refill on Xanax (last filled on 04/27/17) and Compazine (last filled on 04/27/17).

## 2017-07-10 ENCOUNTER — Other Ambulatory Visit: Payer: Self-pay | Admitting: Neurological Surgery

## 2017-07-10 DIAGNOSIS — M4316 Spondylolisthesis, lumbar region: Secondary | ICD-10-CM

## 2017-07-14 ENCOUNTER — Other Ambulatory Visit: Payer: Self-pay | Admitting: Urology

## 2017-07-14 ENCOUNTER — Telehealth: Payer: Self-pay

## 2017-07-14 DIAGNOSIS — C531 Malignant neoplasm of exocervix: Secondary | ICD-10-CM

## 2017-07-14 DIAGNOSIS — F418 Other specified anxiety disorders: Secondary | ICD-10-CM

## 2017-07-14 MED ORDER — PROCHLORPERAZINE MALEATE 10 MG PO TABS: 10.0000 mg | ORAL_TABLET | Freq: Four times a day (QID) | ORAL | 0 refills | Status: DC | PRN

## 2017-07-14 MED ORDER — ALPRAZOLAM 0.25 MG PO TABS: 0.2500 mg | ORAL_TABLET | Freq: Two times a day (BID) | ORAL | 0 refills | Status: DC | PRN

## 2017-07-14 MED ORDER — HYDROMORPHONE HCL 4 MG PO TABS: 4.0000 mg | ORAL_TABLET | ORAL | 0 refills | Status: DC | PRN

## 2017-07-14 MED FILL — HYDROmorphone HCL 4 MG TABS: 4 | 5 days supply | Qty: 30 | Fill #0

## 2017-07-14 MED FILL — ALPRAZolam 0.25 MG TABS: 0.25 | 15 days supply | Qty: 30 | Fill #0

## 2017-07-14 MED FILL — PROCHLORPERAZINE 5 MG TAB: 5 | 8 days supply | Qty: 60 | Fill #0

## 2017-07-14 NOTE — Telephone Encounter (Signed)
Pt called wanting refill on Compazine, Xanax, and Dilaudid prescriptions for upcoming MRI appt. Dr. Sondra Come out of the office today, so prescriptions refilled by Ashlyn Burning-PA. Pt updated on status and told to call back if she has any other issues.

## 2017-07-16 ENCOUNTER — Ambulatory Visit
Admission: RE | Admit: 2017-07-16 | Discharge: 2017-07-16 | Disposition: A | Discharge status: Home / Self Care | Payer: Medicaid Other | Source: Ambulatory Visit | Attending: Neurological Surgery | Admitting: Neurological Surgery

## 2017-07-16 DIAGNOSIS — M4316 Spondylolisthesis, lumbar region: Secondary | ICD-10-CM

## 2017-07-21 ENCOUNTER — Ambulatory Visit
Admission: RE | Admit: 2017-07-21 | Discharge: 2017-07-21 | Disposition: A | Discharge status: Home / Self Care | Payer: Medicaid Other | Source: Ambulatory Visit | Attending: Neurological Surgery | Admitting: Neurological Surgery

## 2017-07-27 ENCOUNTER — Other Ambulatory Visit: Payer: Self-pay

## 2017-08-02 ENCOUNTER — Telehealth: Payer: Self-pay

## 2017-08-02 NOTE — Telephone Encounter (Signed)
Pt called asking for refill on Dilaudid prescription. Dr. Sondra Come in breast clinic this morning so note left for when he returns. Will call pt back if not able to fill prescription.

## 2017-08-03 ENCOUNTER — Other Ambulatory Visit: Payer: Self-pay | Admitting: Radiation Oncology

## 2017-08-03 DIAGNOSIS — F418 Other specified anxiety disorders: Secondary | ICD-10-CM

## 2017-08-03 MED ORDER — HYDROMORPHONE HCL 4 MG PO TABS: 4.0000 mg | ORAL_TABLET | ORAL | 0 refills | Status: DC | PRN

## 2017-08-09 ENCOUNTER — Other Ambulatory Visit: Payer: Self-pay

## 2017-08-09 ENCOUNTER — Emergency Department (HOSPITAL_COMMUNITY)
Admission: EM | Admit: 2017-08-09 | Discharge: 2017-08-10 | Disposition: A | Discharge status: Home / Self Care | Payer: Medicaid Other | Attending: Emergency Medicine | Admitting: Emergency Medicine

## 2017-08-09 ENCOUNTER — Encounter (HOSPITAL_COMMUNITY): Payer: Self-pay

## 2017-08-09 DIAGNOSIS — M5430 Sciatica, unspecified side: Secondary | ICD-10-CM | POA: Insufficient documentation

## 2017-08-09 DIAGNOSIS — F17200 Nicotine dependence, unspecified, uncomplicated: Secondary | ICD-10-CM | POA: Diagnosis not present

## 2017-08-09 DIAGNOSIS — Z8541 Personal history of malignant neoplasm of cervix uteri: Secondary | ICD-10-CM | POA: Insufficient documentation

## 2017-08-09 DIAGNOSIS — I10 Essential (primary) hypertension: Secondary | ICD-10-CM | POA: Insufficient documentation

## 2017-08-09 DIAGNOSIS — M545 Low back pain: Secondary | ICD-10-CM | POA: Diagnosis present

## 2017-08-09 DIAGNOSIS — Z79899 Other long term (current) drug therapy: Secondary | ICD-10-CM | POA: Diagnosis not present

## 2017-08-09 LAB — I-STAT BETA HCG BLOOD, ED (MC, WL, AP ONLY)

## 2017-08-09 LAB — BASIC METABOLIC PANEL
ANION GAP: 10 (ref 5–15)
BUN: 14 mg/dL (ref 6–20)
CALCIUM: 9.4 mg/dL (ref 8.9–10.3)
CHLORIDE: 101 mmol/L (ref 101–111)
CO2: 29 mmol/L (ref 22–32)
CREATININE: 0.99 mg/dL (ref 0.44–1.00)
GFR calc non Af Amer: >60 mL/min (ref >60)
Glucose, Bld: 85 mg/dL (ref 65–99)
Potassium: 3.9 mmol/L (ref 3.5–5.1)
SODIUM: 140 mmol/L (ref 135–145)

## 2017-08-09 LAB — CBC
HCT: 40.4 % (ref 36.0–46.0)
HEMOGLOBIN: 12.8 g/dL (ref 12.0–15.0)
MCH: 28.6 pg (ref 26.0–34.0)
MCHC: 31.7 g/dL (ref 30.0–36.0)
MCV: 90.2 fL (ref 78.0–100.0)
PLATELETS: 262 10*3/uL (ref 150–400)
RBC: 4.48 MIL/uL (ref 3.87–5.11)
RDW: 13.9 % (ref 11.5–15.5)
WBC: 7.2 10*3/uL (ref 4.0–10.5)

## 2017-08-09 NOTE — ED Triage Notes (Signed)
Pt reports that her sciatica on her R side is making her unable to walk, pt has had an MRI done with PCP and had xray scheduled for the 20th. Pt also adds that for the past several days she has been having dizziness when laying down along with nausea.

## 2017-08-10 ENCOUNTER — Encounter (HOSPITAL_COMMUNITY): Payer: Self-pay | Admitting: Emergency Medicine

## 2017-08-10 ENCOUNTER — Other Ambulatory Visit: Payer: Self-pay

## 2017-08-10 LAB — URINALYSIS, ROUTINE W REFLEX MICROSCOPIC
BILIRUBIN URINE: NEGATIVE
Bacteria, UA: NONE SEEN
Glucose, UA: NEGATIVE mg/dL
KETONES UR: NEGATIVE mg/dL
LEUKOCYTES UA: NEGATIVE
NITRITE: NEGATIVE
PROTEIN: NEGATIVE mg/dL
Specific Gravity, Urine: 1.01 (ref 1.005–1.030)
pH: 5 (ref 5.0–8.0)

## 2017-08-10 LAB — I-STAT TROPONIN, ED: Troponin i, poc: 0 ng/mL (ref 0.00–0.08)

## 2017-08-10 MED ORDER — LIDOCAINE 5 % EX PTCH
1.0000 | MEDICATED_PATCH | CUTANEOUS | Status: DC
  Administered 2017-08-10: 1 via TRANSDERMAL
  Filled 2017-08-10: qty 1

## 2017-08-10 MED ORDER — PREDNISONE 20 MG PO TABS
60.0000 mg | ORAL_TABLET | Freq: Once | ORAL | Status: AC
  Administered 2017-08-10: 60 mg via ORAL
  Filled 2017-08-10: qty 3

## 2017-08-10 MED ORDER — KETOROLAC TROMETHAMINE 30 MG/ML IJ SOLN
30.0000 mg | Freq: Once | INTRAMUSCULAR | Status: AC
  Administered 2017-08-10: 30 mg via INTRAMUSCULAR
  Filled 2017-08-10: qty 1

## 2017-08-10 MED ORDER — LIDOCAINE 5 % EX PTCH: 1.0000 | MEDICATED_PATCH | CUTANEOUS | 0 refills | Status: DC

## 2017-08-10 MED ORDER — PREDNISONE 20 MG PO TABS: ORAL_TABLET | ORAL | 0 refills | Status: DC

## 2017-08-10 NOTE — ED Provider Notes (Addendum)
[facility] EMERGENCY DEPARTMENT Provider Note   CSN: [id number] Arrival date & time: 08/09/17  2133     History   Chief Complaint Chief Complaint  Patient presents with  . Sciatica  . Dizziness    HPI Valerie Kerr is a 57 y.o. female.  The history is provided by the patient.  Back Pain   This is a chronic problem. The current episode started more than 1 week ago. The problem occurs constantly. The problem has been gradually worsening. The pain is associated with no known injury. The pain is present in the gluteal region. The quality of the pain is described as stabbing. The pain radiates to the right thigh. The pain is at a severity of 10/10. The pain is severe. The symptoms are aggravated by bending and twisting. The pain is the same all the time. Pertinent negatives include no chest pain, no fever, no numbness, no weight loss, no headaches, no abdominal pain, no abdominal swelling, no bowel incontinence, no perianal numbness, no bladder incontinence, no dysuria, no pelvic pain, no leg pain, no paresthesias, no paresis, no tingling and no weakness. Treatments tried: methadone and diluadid. The treatment provided no relief. Risk factors include menopause.  No cp no sob no weakness or numbness no changes in bowel or bladder.  Lightheaded occasionally when pain is back.  Seeing a spine specialist see recent MRI  Past Medical History:  Diagnosis Date  . Anxiety   . Cervical cancer [facility]) oncologist-  dr gorsuch/ dr Sondra Come   02-20-2016 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---  treatment concurrent chemo (week 6 on hold due to pancytopenia) and radiation therapy (external beam 04-12-2016 to 05-31-2016)  started high-dose rate brachytherapy 05-26-2016  . Chemotherapy-induced thrombocytopenia   . Chronic pain disorder    back,neck-- chronic methadone  . Environmental allergies    allergy to dust mites  . GERD (gastroesophageal reflux disease)   .  Headache   . Herniated disc, cervical   . History of external beam radiation therapy 04/12/16-05/31/16   pelvis 45 Gy in 25 fractions, in  30 sessions, pelvis boost 9 Gy in 5 fractions  . History of radiation therapy 06/20/16-07/04/16   tandem and ring applicator to cervix 28 Gy in 5 fractions  . Hypertension   . Hypokalemia   . Hypomagnesemia    po supplement and IV replacement  . Leukopenia due to antineoplastic chemotherapy ([place])   . Lumbar herniated disc   . Pancytopenia due to chemotherapy ([place])   . Periodontitis     Patient Active Problem List   Diagnosis Date Noted  . Spinal stenosis 01/18/2017  . Insomnia 01/18/2017  . Lumbar herniated disc 09/05/2016  . Cervical radiculopathy 09/05/2016  . Neuropathy 09/05/2016  . Depression 08/15/2016  . Inguinal lymphadenopathy 06/30/2016  . Thrombocytopenia ([place]) 05/24/2016  . Dehydration 05/24/2016  . Anxiety 05/24/2016  . Nausea and vomiting 05/17/2016  . Leukopenia due to antineoplastic chemotherapy ([place]) 05/12/2016  . Hypomagnesemia 05/12/2016  . Hypokalemia 05/12/2016  . Anemia due to antineoplastic chemotherapy 05/04/2016  . Continuous tobacco abuse 04/01/2016  . Poor dentition 04/01/2016  . Essential hypertension 04/01/2016  . Methadone use ([place]) 04/01/2016  . Allergic sinusitis 04/01/2016  . Gastroesophageal reflux disease 04/01/2016  . Cervical cancer ([place]) 03/03/2016    Past Surgical History:  Procedure Laterality Date  . ABCESS DRAINAGE Left 1982   breast  . CARPAL TUNNEL RELEASE Bilateral   . CERVICAL DISC SURGERY  2010  . MULTIPLE  EXTRACTIONS WITH ALVEOLOPLASTY N/A 04/08/2016   Procedure: Extraction of tooth #'s [phone] with alveoloplasty  and gross debridement of remaining teeth;  Surgeon: Lenn Cal, DDS;  Location: [facility];  Service: Oral Surgery;  Laterality: N/A;  . TANDEM RING INSERTION N/A 05/26/2016   Procedure: TANDEM RING INSERTION;  Surgeon: [name], MD;  Location: [facility];  Service: Urology;  Laterality: N/A;  . TANDEM RING INSERTION N/A 06/02/2016   Procedure: TANDEM RING INSERTION;  Surgeon: [name], MD;  Location: [facility];  Service: Urology;  Laterality: N/A;  . TANDEM RING INSERTION N/A 06/20/2016   Procedure: TANDEM RING INSERTION;  Surgeon: [name], MD;  Location: [facility];  Service: Urology;  Laterality: N/A;  . TANDEM RING INSERTION N/A 06/23/2016   Procedure: TANDEM RING INSERTION;  Surgeon: [name], MD;  Location: [facility];  Service: Urology;  Laterality: N/A;  . TANDEM RING INSERTION N/A 07/04/2016   Procedure: TANDEM RING INSERTION;  Surgeon: [name], MD;  Location: [facility];  Service: Urology;  Laterality: N/A;     OB History   None      Home Medications    Prior to Admission medications   Medication Sig Start Date End Date Taking? Authorizing Provider  ALPRAZolam (XANAX) 0.25 MG tablet Take 1 tablet (0.25 mg total) by mouth 2 (two) times daily as needed for anxiety. 07/14/17   Bruning, Ashlyn, PA-C  amoxicillin (AMOXIL) 500 MG capsule Take 1 capsule (500 mg total) by mouth 3 (three) times daily. Patient not taking: Reported on 06/26/2017 05/19/17   Charlott Rakes, MD  benzonatate (TESSALON) 100 MG capsule Take 1 capsule (100 mg total) by mouth 2 (two) times daily as needed for cough. 05/19/17   Charlott Rakes, MD  cetirizine (ZYRTEC) 10 MG tablet Take 1 tablet (10 mg total) by mouth daily. 05/19/17   Charlott Rakes, MD  fluticasone (FLONASE) 50 MCG/ACT nasal spray Place 1 spray into both nostrils daily. 05/19/17   Charlott Rakes, MD  gabapentin (NEURONTIN) 300 MG capsule Take 2 capsules (600 mg total) by mouth 2 (two) times daily. 05/19/17   Charlott Rakes, MD  HYDROmorphone (DILAUDID) 4 MG tablet Take 1 tablet (4 mg total) by mouth every 4 (four) hours as needed for severe pain. 08/03/17   [name], MD  ibuprofen (ADVIL,MOTRIN) 600 MG tablet Take 1  tablet (600 mg total) by mouth every 8 (eight) hours as needed. 05/19/17   Charlott Rakes, MD  lisinopril-hydrochlorothiazide (PRINZIDE,ZESTORETIC) 20-25 MG tablet Take 1 tablet by mouth daily. 05/19/17   Charlott Rakes, MD  methadone (DOLOPHINE) 10 MG/5ML solution Take 60 mg by mouth daily.     [provider]  mirtazapine (REMERON) 15 MG tablet Take 1 tablet (15 mg total) by mouth at bedtime. Patient not taking: Reported on 06/26/2017 01/18/17   Charlott Rakes, MD  omeprazole (PRILOSEC) 20 MG capsule Take 1 capsule (20 mg total) by mouth daily. 05/19/17   Charlott Rakes, MD  prochlorperazine (COMPAZINE) 10 MG tablet Take 1 tablet (10 mg total) by mouth every 6 (six) hours as needed for nausea. 07/14/17   Bruning, Ashlyn, PA-C  VOLTAREN 1 % GEL Apply 4 g topically 4 (four) times daily. Patient not taking: Reported on 06/26/2017 03/31/17   Charlott Rakes, MD    Family History Family History  Problem Relation Age of Onset  . Cancer Father   . Cancer Sister        throat  .  Cancer Brother        throat and stomach  . Cancer Brother        lung    Social History Social History   Tobacco Use  . Smoking status: Current Every Day Smoker    Packs/day: 0.20    Years: 45.00    Pack years: 9.00    Types: Cigarettes  . Smokeless tobacco: Never Used  . Tobacco comment: 4 cigs daily  Substance Use Topics  . Alcohol use: No  . Drug use: No     Allergies   Codeine; Fentanyl; Hydrocodone-acetaminophen; and Tramadol   Review of Systems Review of Systems  Constitutional: Negative for fever and weight loss.  Cardiovascular: Negative for chest pain and leg swelling.  Gastrointestinal: Negative for abdominal pain, bowel incontinence, nausea, rectal pain and vomiting.  Genitourinary: Negative for bladder incontinence, difficulty urinating, dysuria and pelvic pain.  Musculoskeletal: Positive for back pain. Negative for gait problem, neck pain and neck stiffness.  Neurological:  Negative for dizziness, tingling, tremors, seizures, syncope, facial asymmetry, speech difficulty, weakness, numbness, headaches and paresthesias.  All other systems reviewed and are negative.    Physical Exam Updated Vital Signs BP 128/76   Pulse 74   Temp 98.8 F (37.1 C) (Oral)   Resp 18   SpO2 98%   Physical Exam  Constitutional: She is oriented to person, place, and time. She appears well-developed and well-nourished. No distress.  HENT:  Head: Normocephalic and atraumatic.  Mouth/Throat: No oropharyngeal exudate.  Eyes: Conjunctivae and EOM are normal.  Neck: Normal range of motion. Neck supple.  Cardiovascular: Normal rate, regular rhythm, normal heart sounds and intact distal pulses.  Pulmonary/Chest: Effort normal and breath sounds normal. No stridor. She has no wheezes. She has no rales.  Abdominal: Soft. Bowel sounds are normal. She exhibits no mass. There is no tenderness. There is no rebound and no guarding.  Musculoskeletal: Normal range of motion. She exhibits no edema.  Neurological: She is alert and oriented to person, place, and time. She displays normal reflexes. She exhibits normal muscle tone.  Skin: Skin is warm and dry. Capillary refill takes less than 2 seconds.  Psychiatric: She has a normal mood and affect.     ED Treatments / Results  Labs (all labs ordered are listed, but only abnormal results are displayed) Results for orders placed or performed during the hospital encounter of 75/10/25  Basic metabolic panel  Result Value Ref Range   Sodium 140 135 - 145 mmol/L   Potassium 3.9 3.5 - 5.1 mmol/L   Chloride 101 101 - 111 mmol/L   CO2 29 22 - 32 mmol/L   Glucose, Bld 85 65 - 99 mg/dL   BUN 14 6 - 20 mg/dL   Creatinine, Ser 0.99 0.44 - 1.00 mg/dL   Calcium 9.4 8.9 - 10.3 mg/dL   GFR calc non Af Amer >60 >60 mL/min   GFR calc Af Amer >60 >60 mL/min   Anion gap 10 5 - 15  CBC  Result Value Ref Range   WBC 7.2 4.0 - 10.5 K/uL   RBC 4.48 3.87 -  5.11 MIL/uL   Hemoglobin 12.8 12.0 - 15.0 g/dL   HCT 40.4 36.0 - 46.0 %   MCV 90.2 78.0 - 100.0 fL   MCH 28.6 26.0 - 34.0 pg   MCHC 31.7 30.0 - 36.0 g/dL   RDW 13.9 11.5 - 15.5 %   Platelets 262 150 - 400 K/uL  Urinalysis, Routine w reflex  microscopic  Result Value Ref Range   Color, Urine YELLOW YELLOW   APPearance CLEAR CLEAR   Specific Gravity, Urine 1.010 1.005 - 1.030   pH 5.0 5.0 - 8.0   Glucose, UA NEGATIVE NEGATIVE mg/dL   Hgb urine dipstick SMALL (A) NEGATIVE   Bilirubin Urine NEGATIVE NEGATIVE   Ketones, ur NEGATIVE NEGATIVE mg/dL   Protein, ur NEGATIVE NEGATIVE mg/dL   Nitrite NEGATIVE NEGATIVE   Leukocytes, UA NEGATIVE NEGATIVE   RBC / HPF 0-5 0 - 5 RBC/hpf   WBC, UA 0-5 0 - 5 WBC/hpf   Bacteria, UA NONE SEEN NONE SEEN   Squamous Epithelial / LPF 0-5 0 - 5   Hyaline Casts, UA PRESENT   I-Stat beta hCG blood, ED  Result Value Ref Range   I-stat hCG, quantitative <5.0 <5 mIU/mL   Comment 3          I-stat troponin, ED  Result Value Ref Range   Troponin i, poc 0.00 0.00 - 0.08 ng/mL   Comment 3           Mr Lumbar Spine Wo Contrast  Result Date: 07/21/2017 CLINICAL DATA:  Lumbar spondylolisthesis. Low back pain radiating to both legs which is chronic. Right leg numbness in weakness. Progressive symptoms. EXAM: MRI LUMBAR SPINE WITHOUT CONTRAST TECHNIQUE: Multiplanar, multisequence MR imaging of the lumbar spine was performed. No intravenous contrast was administered. COMPARISON:  Radiography 06/22/2017.  MRI 10/09/2016. FINDINGS: Segmentation:  As numbered previously, L5 has transitional features. Alignment:  3 mm anterolisthesis L3-4. Vertebrae: Newly seen sacral insufficiency fracture with extensive edema, certain to be symptomatic. Conus medullaris and cauda equina: Conus extends to the L1 level. Conus and cauda equina appear normal. Paraspinal and other soft tissues: Negative Disc levels: Small central disc protrusion at T10-11 without apparent neural compression.  Normal appearance at T11-12 and T12-L1. L1-2: Minimal disc bulge.  No stenosis. L2-3: Disc bulge. Biforaminal small disc protrusions. No central canal stenosis. Some potential this could affect either L2 nerve. L3-4: Bands bilateral facet arthropathy with 3 mm of anterolisthesis. Broad-based protrusion of disc material. Stenosis of the canal, lateral recesses and neural foramina that could be symptomatic. L4-5: Disc degeneration with broad-based protrusion slightly more prominent towards the left. Facet hypertrophy. Stenosis of the canal, lateral recesses and neural foramina that could be symptomatic. L5-S1: Transitional level.  No disc pathology.  No canal stenosis. IMPRESSION: The dominant finding in this case is a newly seen sacral insufficiency fracture with extensive edema throughout the sacrum. Chronic degenerative changes throughout the lumbar spine appear similar to the study of July 2018. L2-3: Biforaminal disc protrusions could affect either L2 nerve. L3-4: Multifactorial stenosis because of facet arthropathy with 3 mm of anterolisthesis and protrusion of the disc including biforaminal involvement. L4-5: Multifactorial stenosis because of facet arthropathy, broad-based protrusion of the disc including foraminal involvement, right more than left. Electronically Signed   By: Nelson Chimes M.D.   On: 07/21/2017 07:57    EKG EKG Interpretation  Date/Time:  Wednesday Aug 09 2017 22:02:31 EDT Ventricular Rate:  84 PR Interval:  146 QRS Duration: 88 QT Interval:  364 QTC Calculation: 430 R Axis:   75 Text Interpretation:  Normal sinus rhythm with sinus arrhythmia Confirmed by Randal Buba, Nariyah Osias ([id number]) on 08/10/2017 2:27:11 AM   Radiology No results found.  Procedures Procedures (including critical care time)  Medications Ordered in ED Medications  lidocaine (LIDODERM) 5 % 1 patch (1 patch Transdermal Patch Applied 08/10/17 0340)  ketorolac (TORADOL) 30  MG/ML injection 30 mg (30 mg Intramuscular  Given 08/10/17 0341)  predniSONE (DELTASONE) tablet 60 mg (60 mg Oral Given 08/10/17 0341)      Final Clinical Impressions(s) / ED Diagnoses  Follow up with your spine specialist.  Will add lidoderm and steroids to her current narcotic regimen Return for weakness, numbness, changes in vision or speech, fevers >100.4 unrelieved by medication, shortness of breath, intractable vomiting, or diarrhea, abdominal pain, Inability to tolerate liquids or food, cough, altered mental status or any concerns. No signs of systemic illness or infection. The patient is nontoxic-appearing on exam and vital signs are within normal limits.   I have reviewed the triage vital signs and the nursing notes. Pertinent labs &imaging results that were available during my care of the patient were reviewed by me and considered in my medical decision making (see chart for details).  After history, exam, and medical workup I feel the patient has been appropriately medically screened and is safe for discharge home. Pertinent diagnoses were discussed with the patient. Patient was given return precautions.    Jeffry Vogelsang, MD 08/10/17 Mauricio Po, MD 08/10/17 [id number]

## 2017-08-11 ENCOUNTER — Other Ambulatory Visit: Payer: Self-pay

## 2017-08-11 DIAGNOSIS — M48062 Spinal stenosis, lumbar region with neurogenic claudication: Secondary | ICD-10-CM

## 2017-08-11 MED ORDER — IBUPROFEN 600 MG PO TABS: 600.0000 mg | ORAL_TABLET | Freq: Three times a day (TID) | ORAL | 0 refills | Status: DC | PRN

## 2017-08-15 ENCOUNTER — Other Ambulatory Visit: Payer: Self-pay | Admitting: Neurological Surgery

## 2017-08-15 ENCOUNTER — Telehealth: Payer: Self-pay

## 2017-08-15 ENCOUNTER — Other Ambulatory Visit: Payer: Self-pay | Admitting: Radiation Oncology

## 2017-08-15 DIAGNOSIS — C531 Malignant neoplasm of exocervix: Secondary | ICD-10-CM

## 2017-08-15 DIAGNOSIS — M8580 Other specified disorders of bone density and structure, unspecified site: Secondary | ICD-10-CM

## 2017-08-15 MED ORDER — HYDROMORPHONE HCL 4 MG PO TABS: 4.0000 mg | ORAL_TABLET | ORAL | 0 refills | Status: DC | PRN

## 2017-08-15 MED ORDER — PROCHLORPERAZINE MALEATE 10 MG PO TABS: 10.0000 mg | ORAL_TABLET | Freq: Four times a day (QID) | ORAL | 0 refills | Status: DC | PRN

## 2017-08-15 NOTE — Telephone Encounter (Signed)
Received phone call from pt requesting refill on Dilaudid and Compazine prescriptions. Pt stated she has seen a neurosurgeon but they told her the prescribing MD would need to refill her PRN medications until they decided how to proceed/came up with a plan. Dr. Sondra Come made aware. Told pt to call back should she have any other questions/concerns.

## 2017-08-16 ENCOUNTER — Other Ambulatory Visit: Payer: Self-pay

## 2017-08-16 ENCOUNTER — Emergency Department (HOSPITAL_COMMUNITY)
Admission: EM | Admit: 2017-08-16 | Discharge: 2017-08-17 | Disposition: A | Discharge status: Home / Self Care | Payer: Medicaid Other | Attending: Emergency Medicine | Admitting: Emergency Medicine

## 2017-08-16 ENCOUNTER — Encounter (HOSPITAL_COMMUNITY): Payer: Self-pay | Admitting: *Deleted

## 2017-08-16 ENCOUNTER — Other Ambulatory Visit: Payer: Self-pay | Admitting: Neurological Surgery

## 2017-08-16 DIAGNOSIS — R109 Unspecified abdominal pain: Secondary | ICD-10-CM

## 2017-08-16 DIAGNOSIS — I10 Essential (primary) hypertension: Secondary | ICD-10-CM | POA: Diagnosis not present

## 2017-08-16 DIAGNOSIS — R1032 Left lower quadrant pain: Secondary | ICD-10-CM | POA: Diagnosis present

## 2017-08-16 DIAGNOSIS — F17210 Nicotine dependence, cigarettes, uncomplicated: Secondary | ICD-10-CM | POA: Insufficient documentation

## 2017-08-16 DIAGNOSIS — Z79899 Other long term (current) drug therapy: Secondary | ICD-10-CM | POA: Insufficient documentation

## 2017-08-16 DIAGNOSIS — M8448XA Pathological fracture, other site, initial encounter for fracture: Secondary | ICD-10-CM

## 2017-08-16 LAB — URINALYSIS, ROUTINE W REFLEX MICROSCOPIC
Bilirubin Urine: NEGATIVE
GLUCOSE, UA: NEGATIVE mg/dL
Ketones, ur: NEGATIVE mg/dL
Leukocytes, UA: NEGATIVE
Nitrite: NEGATIVE
PH: 6 (ref 5.0–8.0)
Protein, ur: NEGATIVE mg/dL
Specific Gravity, Urine: 1.013 (ref 1.005–1.030)

## 2017-08-16 LAB — CBC
HCT: 39.5 % (ref 36.0–46.0)
Hemoglobin: 12.6 g/dL (ref 12.0–15.0)
MCH: 28.1 pg (ref 26.0–34.0)
MCHC: 31.9 g/dL (ref 30.0–36.0)
MCV: 88.2 fL (ref 78.0–100.0)
PLATELETS: 278 10*3/uL (ref 150–400)
RBC: 4.48 MIL/uL (ref 3.87–5.11)
RDW: 13.6 % (ref 11.5–15.5)
WBC: 9.9 10*3/uL (ref 4.0–10.5)

## 2017-08-16 NOTE — ED Triage Notes (Signed)
Pt c/o LLQ pain that started eariler today. With frequent urination. Has taken dilaudid without relief

## 2017-08-17 ENCOUNTER — Emergency Department (HOSPITAL_COMMUNITY): Payer: Medicaid Other

## 2017-08-17 ENCOUNTER — Encounter (HOSPITAL_COMMUNITY): Payer: Self-pay | Admitting: Radiology

## 2017-08-17 LAB — COMPREHENSIVE METABOLIC PANEL
ALT: 23 U/L (ref 14–54)
AST: 21 U/L (ref 15–41)
Albumin: 3.9 g/dL (ref 3.5–5.0)
Alkaline Phosphatase: 84 U/L (ref 38–126)
Anion gap: 11 (ref 5–15)
BUN: 15 mg/dL (ref 6–20)
CHLORIDE: 98 mmol/L — AB (ref 101–111)
CO2: 31 mmol/L (ref 22–32)
CREATININE: 0.9 mg/dL (ref 0.44–1.00)
Calcium: 9.4 mg/dL (ref 8.9–10.3)
GFR calc Af Amer: >60 mL/min (ref >60)
GFR calc non Af Amer: >60 mL/min (ref >60)
Glucose, Bld: 99 mg/dL (ref 65–99)
POTASSIUM: 3.7 mmol/L (ref 3.5–5.1)
SODIUM: 140 mmol/L (ref 135–145)
Total Bilirubin: 0.7 mg/dL (ref 0.3–1.2)
Total Protein: 7.7 g/dL (ref 6.5–8.1)

## 2017-08-17 MED ORDER — DICYCLOMINE HCL 20 MG PO TABS: 20.0000 mg | ORAL_TABLET | Freq: Two times a day (BID) | ORAL | 0 refills | Status: DC

## 2017-08-17 MED ORDER — SODIUM CHLORIDE 0.9 % IV BOLUS
1000.0000 mL | Freq: Once | INTRAVENOUS | Status: AC
  Administered 2017-08-17: 1000 mL via INTRAVENOUS

## 2017-08-17 MED ORDER — LIDOCAINE 5 % EX PTCH: 1.0000 | MEDICATED_PATCH | CUTANEOUS | 0 refills | Status: DC

## 2017-08-17 MED ORDER — KETOROLAC TROMETHAMINE 30 MG/ML IJ SOLN
30.0000 mg | Freq: Once | INTRAMUSCULAR | Status: AC
  Administered 2017-08-17: 30 mg via INTRAMUSCULAR

## 2017-08-17 MED ORDER — IOHEXOL 300 MG/ML  SOLN
100.0000 mL | Freq: Once | INTRAMUSCULAR | Status: AC
  Administered 2017-08-17: 100 mL via INTRAVENOUS

## 2017-08-17 MED ORDER — LIDOCAINE 5 % EX PTCH
1.0000 | MEDICATED_PATCH | CUTANEOUS | Status: DC
  Administered 2017-08-17: 1 via TRANSDERMAL
  Filled 2017-08-17: qty 1

## 2017-08-17 MED ORDER — KETOROLAC TROMETHAMINE 30 MG/ML IJ SOLN
30.0000 mg | Freq: Once | INTRAMUSCULAR | Status: DC
  Filled 2017-08-17: qty 1

## 2017-08-17 MED ORDER — FAMOTIDINE 20 MG PO TABS: 20.0000 mg | ORAL_TABLET | Freq: Two times a day (BID) | ORAL | 0 refills | Status: DC

## 2017-08-17 NOTE — ED Provider Notes (Signed)
[facility] EMERGENCY DEPARTMENT Provider Note   CSN: [id number] Arrival date & time: 08/16/17  2244     History   Chief Complaint Chief Complaint  Patient presents with  . Abdominal Pain    HPI Valerie Kerr is a 57 y.o. female with a past medical history of chronic pain, GERD, hypertension, cervical cancer, who presents to ED for evaluation of 12-hour history of left lower quadrant abdominal pain.  States that the pain is been intermittent cannot recall any specific aggravating or alleviating factors.  She reports frequent urination.  No improvement with her home Dilaudid 4 mg p.o., which she takes for her sciatica. She reports having an episode of constipation 2 days ago which resolved with over-the-counter laxatives.  She denies any prior history of similar symptoms in the past.  Denies any nausea, vomiting, blood in stool, dysuria, hematuria, fevers, vaginal discharge, abnormal vaginal bleeding, shortness of breath, prior abdominal surgeries.  She reports daily tobacco use but denies any alcohol or other drug use.  HPI  Past Medical History:  Diagnosis Date  . Anxiety   . 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 Chemotherapy-induced thrombocytopenia   . Chronic pain disorder    back,neck-- chronic methadone  . Environmental allergies    allergy to dust mites  . GERD (gastroesophageal reflux disease)   . Headache   . Herniated disc, cervical   . History of external beam radiation therapy 04/12/16-05/31/16   pelvis 45 Gy in 25 fractions, in  30 sessions, pelvis boost 9 Gy in 5 fractions  . History of radiation therapy 06/20/16-07/04/16   tandem and ring applicator to cervix 28 Gy in 5 fractions   . Hypertension   . Hypokalemia   . Hypomagnesemia    po supplement and IV replacement  . Leukopenia due to antineoplastic chemotherapy ([place])   . Lumbar herniated disc   . Pancytopenia due to chemotherapy ([place])   . Periodontitis     Patient Active Problem List   Diagnosis Date Noted  . Spinal stenosis 01/18/2017  . Insomnia 01/18/2017  . Lumbar herniated disc 09/05/2016  . Cervical radiculopathy 09/05/2016  . Neuropathy 09/05/2016  . Depression 08/15/2016  . Inguinal lymphadenopathy 06/30/2016  . Thrombocytopenia ([place]) 05/24/2016  . Dehydration 05/24/2016  . Anxiety 05/24/2016  . Nausea and vomiting 05/17/2016  . Leukopenia due to antineoplastic chemotherapy ([place]) 05/12/2016  . Hypomagnesemia 05/12/2016  . Hypokalemia 05/12/2016  . Anemia due to antineoplastic chemotherapy 05/04/2016  . Continuous tobacco abuse 04/01/2016  . Poor dentition 04/01/2016  . Essential hypertension 04/01/2016  . Methadone use ([place]) 04/01/2016  . Allergic sinusitis 04/01/2016  . Gastroesophageal reflux disease 04/01/2016  . Cervical cancer ([place]) 03/03/2016    Past Surgical History:  Procedure Laterality Date  . ABCESS DRAINAGE Left 1982   breast  . CARPAL TUNNEL RELEASE Bilateral   . CERVICAL DISC SURGERY  2010  . MULTIPLE EXTRACTIONS WITH ALVEOLOPLASTY N/A 04/08/2016   Procedure: Extraction of tooth #'s [phone] with alveoloplasty  and gross debridement of remaining teeth;  Surgeon: Lenn Cal, DDS;  Location: [facility];  Service: Oral Surgery;  Laterality: N/A;  . TANDEM RING INSERTION N/A 05/26/2016   Procedure: TANDEM RING INSERTION;  Surgeon: [name], MD;  Location: [facility];  Service: Urology;  Laterality: N/A;  . TANDEM RING INSERTION N/A 06/02/2016   Procedure: TANDEM RING INSERTION;  Surgeon: [name], MD;  Location: [facility];  Service: Urology;  Laterality: N/A;  . TANDEM RING INSERTION N/A 06/20/2016   Procedure: TANDEM RING  INSERTION;  Surgeon: [name], MD;  Location: [facility];  Service: Urology;  Laterality: N/A;  . TANDEM RING INSERTION N/A 06/23/2016   Procedure: TANDEM RING INSERTION;  Surgeon: [name], MD;  Location: [facility];  Service: Urology;  Laterality: N/A;  . TANDEM RING INSERTION N/A 07/04/2016   Procedure: TANDEM RING INSERTION;  Surgeon: [name], MD;  Location: [facility];  Service: Urology;  Laterality: N/A;     OB History   None      Home Medications    Prior to Admission medications   Medication Sig Start Date End Date Taking? Authorizing Provider  ALPRAZolam (XANAX) 0.25 MG tablet Take 1 tablet (0.25 mg total) by mouth 2 (two) times daily as needed for anxiety. 07/14/17   Bruning, Ashlyn, PA-C  amoxicillin (AMOXIL) 500 MG capsule Take 1 capsule (500 mg total) by mouth 3 (three) times daily. Patient not taking: Reported on 06/26/2017 05/19/17   Charlott Rakes, MD  benzonatate (TESSALON) 100 MG capsule Take 1 capsule (100 mg total) by mouth 2 (two) times daily as needed for cough. 05/19/17   Charlott Rakes, MD  cetirizine (ZYRTEC) 10 MG tablet Take 1 tablet (10 mg total) by mouth daily. 05/19/17   Charlott Rakes, MD  dicyclomine (BENTYL) 20 MG tablet Take 1 tablet (20 mg total) by mouth 2 (two) times daily. 08/17/17   Carnetta Losada, PA-C  famotidine (PEPCID) 20 MG tablet Take 1 tablet (20 mg total) by mouth 2 (two) times daily. 08/17/17   Floyed Masoud, PA-C  fluticasone (FLONASE) 50 MCG/ACT nasal spray Place 1 spray into both nostrils daily. 05/19/17   Charlott Rakes, MD  gabapentin (NEURONTIN) 300 MG capsule Take 2 capsules (600 mg total) by mouth 2 (two) times daily. 05/19/17   Charlott Rakes, MD  HYDROmorphone (DILAUDID) 4 MG tablet Take 1 tablet (4 mg total) by mouth every 4 (four) hours as needed for severe pain. 08/15/17   [name], MD  ibuprofen (ADVIL,MOTRIN) 600 MG tablet Take 1 tablet (600 mg total) by mouth every 8  (eight) hours as needed. 08/11/17   Charlott Rakes, MD  lidocaine (LIDODERM) 5 % Place 1 patch onto the skin daily. Remove & Discard patch within 12 hours or as directed by MD 08/17/17   Delia Heady, PA-C  lisinopril-hydrochlorothiazide (PRINZIDE,ZESTORETIC) 20-25 MG tablet Take 1 tablet by mouth daily. 05/19/17   Charlott Rakes, MD  methadone (DOLOPHINE) 10 MG/5ML solution Take 60 mg by mouth daily.     [provider]  mirtazapine (REMERON) 15 MG tablet Take 1 tablet (15 mg total) by mouth at bedtime. Patient not taking: Reported on 06/26/2017 01/18/17   Charlott Rakes, MD  omeprazole (PRILOSEC) 20 MG capsule Take 1 capsule (20 mg total) by mouth daily. 05/19/17   Charlott Rakes, MD  predniSONE (DELTASONE) 20 MG tablet 3 tabs po day one, then 2 po daily x 4 days 08/10/17   Palumbo, April, MD  prochlorperazine (COMPAZINE) 10 MG tablet Take 1 tablet (10 mg total) by mouth every 6 (six) hours as needed for nausea. 08/15/17   [name], MD  VOLTAREN 1 % GEL Apply 4 g topically 4 (four) times daily. Patient  not taking: Reported on 06/26/2017 03/31/17   Charlott Rakes, MD    Family History Family History  Problem Relation Age of Onset  . Cancer Father   . Cancer Sister        throat  . Cancer Brother        throat and stomach  . Cancer Brother        lung    Social History Social History   Tobacco Use  . Smoking status: Current Every Day Smoker    Packs/day: 0.20    Years: 45.00    Pack years: 9.00    Types: Cigarettes  . Smokeless tobacco: Never Used  . Tobacco comment: 4 cigs daily  Substance Use Topics  . Alcohol use: No  . Drug use: No     Allergies   Codeine; Fentanyl; Hydrocodone-acetaminophen; and Tramadol   Review of Systems Review of Systems  Constitutional: Negative for appetite change, chills and fever.  HENT: Negative for ear pain, rhinorrhea, sneezing and sore throat.   Eyes: Negative for photophobia and visual disturbance.  Respiratory: Negative for  cough, chest tightness, shortness of breath and wheezing.   Cardiovascular: Negative for chest pain and palpitations.  Gastrointestinal: Positive for abdominal pain. Negative for blood in stool, constipation, diarrhea, nausea and vomiting.  Genitourinary: Positive for frequency. Negative for dysuria, hematuria, urgency, vaginal bleeding and vaginal discharge.  Musculoskeletal: Negative for myalgias.  Skin: Negative for rash.  Neurological: Negative for dizziness, weakness and light-headedness.     Physical Exam Updated Vital Signs BP (!) 171/89 (BP Location: Right Arm)   Pulse 88   Temp 98.5 F (36.9 C) (Oral)   Resp 16   SpO2 100%   Physical Exam  Constitutional: She appears well-developed and well-nourished. No distress.  Nontoxic.  No acute distress.  Does not appear dehydrated.  HENT:  Head: Normocephalic and atraumatic.  Nose: Nose normal.  Eyes: Conjunctivae and EOM are normal. Left eye exhibits no discharge. No scleral icterus.  Neck: Normal range of motion. Neck supple.  Cardiovascular: Normal rate, regular rhythm, normal heart sounds and intact distal pulses. Exam reveals no gallop and no friction rub.  No murmur heard. Pulmonary/Chest: Effort normal and breath sounds normal. No respiratory distress.  Abdominal: Soft. Bowel sounds are normal. She exhibits no distension. There is tenderness in the suprapubic area and left lower quadrant. There is no guarding.  Musculoskeletal: Normal range of motion. She exhibits no edema.  Neurological: She is alert. She exhibits normal muscle tone. Coordination normal.  Skin: Skin is warm and dry. No rash noted.  Psychiatric: She has a normal mood and affect.  Nursing note and vitals reviewed.    ED Treatments / Results  Labs (all labs ordered are listed, but only abnormal results are displayed) Labs Reviewed  COMPREHENSIVE METABOLIC PANEL - Abnormal; Notable for the following components:      Result Value   Chloride 98 (*)     All other components within normal limits  URINALYSIS, ROUTINE W REFLEX MICROSCOPIC - Abnormal; Notable for the following components:   Hgb urine dipstick SMALL (*)    Bacteria, UA RARE (*)    All other components within normal limits  CBC    EKG None  Radiology Ct Abdomen Pelvis W Contrast  Result Date: 08/17/2017 CLINICAL DATA:  Abdominal pain. EXAM: CT ABDOMEN AND PELVIS WITH CONTRAST TECHNIQUE: Multidetector CT imaging of the abdomen and pelvis was performed using the standard protocol following bolus administration of intravenous contrast. CONTRAST:  158mL OMNIPAQUE IOHEXOL 300 MG/ML  SOLN COMPARISON:  PET CT 10/20/2016 FINDINGS: Lower chest: Lung bases are clear. No effusions. Heart is normal size. Hepatobiliary: No focal hepatic abnormality. Gallbladder unremarkable. Pancreas: No focal abnormality or ductal dilatation. Spleen: No focal abnormality.  Normal size. Adrenals/Urinary Tract: No adrenal abnormality. No focal renal abnormality. No stones or hydronephrosis. Urinary bladder is unremarkable. Stomach/Bowel: Normal appendix. Stomach, large and small bowel grossly unremarkable. Vascular/Lymphatic: Aortic atherosclerosis. No enlarged abdominal or pelvic lymph nodes. Reproductive: Uterus and adnexa unremarkable.  No mass. Other: No free fluid or free air.  No acute bony abnormality. Musculoskeletal: No acute bony abnormality. IMPRESSION: No acute findings in the abdomen or pelvis. Normal appendix. Aortic atherosclerosis. Electronically Signed   By: Rolm Baptise M.D.   On: 08/17/2017 10:18    Procedures Procedures (including critical care time)  Medications Ordered in ED Medications  lidocaine (LIDODERM) 5 % 1 patch (1 patch Transdermal Patch Applied 08/17/17 0740)  sodium chloride 0.9 % bolus 1,000 mL (1,000 mLs Intravenous New Bag/Given 08/17/17 0741)  ketorolac (TORADOL) 30 MG/ML injection 30 mg (30 mg Intramuscular Given 08/17/17 0809)  iohexol (OMNIPAQUE) 300 MG/ML solution 100 mL  (100 mLs Intravenous Contrast Given 08/17/17 0948)     Initial Impression / Assessment and Plan / ED Course  I have reviewed the triage vital signs and the nursing notes.  Pertinent labs & imaging results that were available during my care of the patient were reviewed by me and considered in my medical decision making (see chart for details).     Patient presents to ED for evaluation of 12-hour history of left lower quadrant abdominal pain.  Pain is been intermittent.  She reports frequent urination and history of constipation 2 days ago which resolved with over-the-counter laxatives.  No prior history of similar symptoms in the past.  Denies any nausea, vomiting, dysuria, hematuria, fevers, vaginal complaints. On physical exam she is overall well appearing.  She has left lower quadrant tenderness to palpation with no rebound or guarding.  Lab work including CBC, CMP, urinalysis unremarkable. CT of the abdomen and pelvis was done to r/o infectious cause and this returned as negative. Suspect h/o constipation could be the cause of her symptoms. She is resting comfortably on my examination after anti-inflammatories and fluids were given.  She is able to tolerate p.o. intake without difficulty prior to discharge.  Will discharge home with refill of lidocaine patches, bentyl and Pepcid and advised her to follow-up with her primary care provider for further evaluation. I did offer obtaining a pelvic ultrasound but patient declined.  Advised to return to ED for any severe worsening symptoms.  Portions of this note were generated with [employer]. Dictation errors may occur despite best attempts at proofreading.   Final Clinical Impressions(s) / ED Diagnoses   Final diagnoses:  Abdominal wall pain    ED Discharge Orders        Ordered    lidocaine (LIDODERM) 5 %  Every 24 hours     08/17/17 1046    dicyclomine (BENTYL) 20 MG tablet  2 times daily     08/17/17 1046    famotidine  (PEPCID) 20 MG tablet  2 times daily     08/17/17 [address] PA-C 08/17/17 [place], MD 08/17/17 1607

## 2017-08-17 NOTE — ED Notes (Signed)
Attempted IV X 2 IV team consulted

## 2017-08-17 NOTE — ED Notes (Signed)
Patient transported to CT 

## 2017-08-17 NOTE — ED Notes (Signed)
D/c reviewed with patient 

## 2017-08-18 ENCOUNTER — Telehealth: Payer: Self-pay | Admitting: Family Medicine

## 2017-08-18 MED ORDER — LIDOCAINE 5 % EX PTCH: 1.0000 | MEDICATED_PATCH | CUTANEOUS | 0 refills | Status: DC

## 2017-08-18 NOTE — Telephone Encounter (Signed)
Pt called to state that [facility] didn't give her  -lidocaine (LIDODERM) 5 % Because it has to be resubmitted by her doctor. Please follow up

## 2017-08-18 NOTE — Telephone Encounter (Signed)
Rx sent to pharmacy   

## 2017-08-19 ENCOUNTER — Emergency Department (HOSPITAL_COMMUNITY): Payer: Medicaid Other

## 2017-08-19 ENCOUNTER — Encounter (HOSPITAL_COMMUNITY): Payer: Self-pay | Admitting: Nurse Practitioner

## 2017-08-19 ENCOUNTER — Emergency Department (HOSPITAL_COMMUNITY)
Admission: EM | Admit: 2017-08-19 | Discharge: 2017-08-19 | Disposition: A | Discharge status: Home / Self Care | Payer: Medicaid Other | Attending: Emergency Medicine | Admitting: Emergency Medicine

## 2017-08-19 DIAGNOSIS — R1032 Left lower quadrant pain: Secondary | ICD-10-CM

## 2017-08-19 DIAGNOSIS — Z79899 Other long term (current) drug therapy: Secondary | ICD-10-CM | POA: Diagnosis not present

## 2017-08-19 DIAGNOSIS — R1030 Lower abdominal pain, unspecified: Secondary | ICD-10-CM | POA: Diagnosis present

## 2017-08-19 DIAGNOSIS — I10 Essential (primary) hypertension: Secondary | ICD-10-CM | POA: Diagnosis not present

## 2017-08-19 DIAGNOSIS — F17210 Nicotine dependence, cigarettes, uncomplicated: Secondary | ICD-10-CM | POA: Insufficient documentation

## 2017-08-19 DIAGNOSIS — Z8541 Personal history of malignant neoplasm of cervix uteri: Secondary | ICD-10-CM | POA: Diagnosis not present

## 2017-08-19 DIAGNOSIS — R52 Pain, unspecified: Secondary | ICD-10-CM

## 2017-08-19 LAB — URINALYSIS, ROUTINE W REFLEX MICROSCOPIC
BILIRUBIN URINE: NEGATIVE
Bacteria, UA: NONE SEEN
Glucose, UA: NEGATIVE mg/dL
Ketones, ur: NEGATIVE mg/dL
Nitrite: NEGATIVE
PH: 6 (ref 5.0–8.0)
Protein, ur: NEGATIVE mg/dL
Specific Gravity, Urine: 1.013 (ref 1.005–1.030)

## 2017-08-19 LAB — CBC
HCT: 39.7 % (ref 36.0–46.0)
Hemoglobin: 13.1 g/dL (ref 12.0–15.0)
MCH: 29.5 pg (ref 26.0–34.0)
MCHC: 33 g/dL (ref 30.0–36.0)
MCV: 89.4 fL (ref 78.0–100.0)
PLATELETS: 257 10*3/uL (ref 150–400)
RBC: 4.44 MIL/uL (ref 3.87–5.11)
RDW: 13.9 % (ref 11.5–15.5)
WBC: 8.2 10*3/uL (ref 4.0–10.5)

## 2017-08-19 LAB — COMPREHENSIVE METABOLIC PANEL
ALT: 23 U/L (ref 14–54)
AST: 18 U/L (ref 15–41)
Albumin: 4.5 g/dL (ref 3.5–5.0)
Alkaline Phosphatase: 94 U/L (ref 38–126)
Anion gap: 11 (ref 5–15)
BUN: 14 mg/dL (ref 6–20)
CHLORIDE: 98 mmol/L — AB (ref 101–111)
CO2: 30 mmol/L (ref 22–32)
CREATININE: 0.88 mg/dL (ref 0.44–1.00)
Calcium: 9.5 mg/dL (ref 8.9–10.3)
GFR calc Af Amer: >60 mL/min (ref >60)
GFR calc non Af Amer: >60 mL/min (ref >60)
GLUCOSE: 122 mg/dL — AB (ref 65–99)
Potassium: 3.1 mmol/L — ABNORMAL LOW (ref 3.5–5.1)
SODIUM: 139 mmol/L (ref 135–145)
Total Bilirubin: 0.7 mg/dL (ref 0.3–1.2)
Total Protein: 8.9 g/dL — ABNORMAL HIGH (ref 6.5–8.1)

## 2017-08-19 LAB — LIPASE, BLOOD: LIPASE: 20 U/L (ref 11–51)

## 2017-08-19 MED ORDER — DOXYCYCLINE HYCLATE 100 MG PO CAPS: 100.0000 mg | ORAL_CAPSULE | Freq: Two times a day (BID) | ORAL | 0 refills | Status: DC

## 2017-08-19 MED ORDER — CYCLOBENZAPRINE HCL 10 MG PO TABS: 10.0000 mg | ORAL_TABLET | Freq: Three times a day (TID) | ORAL | 0 refills | Status: DC | PRN

## 2017-08-19 MED ORDER — HYDROMORPHONE HCL 4 MG PO TABS: 4.0000 mg | ORAL_TABLET | Freq: Four times a day (QID) | ORAL | 0 refills | Status: DC | PRN

## 2017-08-19 MED ORDER — HYDROMORPHONE HCL 1 MG/ML IJ SOLN
0.5000 mg | Freq: Once | INTRAMUSCULAR | Status: AC
  Administered 2017-08-19: 0.5 mg via INTRAVENOUS
  Filled 2017-08-19: qty 1

## 2017-08-19 MED ORDER — ONDANSETRON HCL 4 MG/2ML IJ SOLN
4.0000 mg | Freq: Once | INTRAMUSCULAR | Status: AC
  Administered 2017-08-19: 4 mg via INTRAVENOUS
  Filled 2017-08-19: qty 2

## 2017-08-19 NOTE — ED Triage Notes (Signed)
Pt is c/o severe acute pain that she describes as contractions to the left suprapubic groin area. She states that she was evaluated for these 3 days ago but "feels she needs a pelvic exam, especially due her hx of cervical cancer." She is taking her home pain medication without getting any relief.

## 2017-08-19 NOTE — Discharge Instructions (Addendum)
Follow-up with your oncologist this week.  Call Tuesday to set up an appointment

## 2017-08-19 NOTE — ED Provider Notes (Signed)
[facility] DEPT Provider Note   CSN: [id number] Arrival date & time: 08/19/17  1541     History   Chief Complaint Chief Complaint  Patient presents with  . Abdominal Pain    Left    HPI Valerie Kerr is a 57 y.o. female.  Patient complains of left groin pain.  She has a history of cervical cancer and is getting radiation  The history is provided by the patient. No language interpreter was used.  Abdominal Pain   This is a recurrent problem. The current episode started more than 1 week ago. The problem occurs constantly. The problem has not changed since onset.The pain is associated with an unknown factor. Pain location: Left groin. The pain is at a severity of 6/10. The pain is moderate. Pertinent negatives include anorexia, diarrhea, frequency, hematuria and headaches.    Past Medical History:  Diagnosis Date  . Anxiety   . 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 Chemotherapy-induced thrombocytopenia   . Chronic pain disorder    back,neck-- chronic methadone  . Environmental allergies    allergy to dust mites  . GERD (gastroesophageal reflux disease)   . Headache   . Herniated disc, cervical   . History of external beam radiation therapy 04/12/16-05/31/16   pelvis 45 Gy in 25 fractions, in  30 sessions, pelvis boost 9 Gy in 5 fractions  . History of radiation therapy 06/20/16-07/04/16   tandem and ring applicator to cervix 28 Gy in 5 fractions  . Hypertension   . Hypokalemia   . Hypomagnesemia    po supplement and IV replacement  . Leukopenia due to antineoplastic chemotherapy ([place])   . Lumbar herniated disc   . Pancytopenia due to chemotherapy ([place])   . Periodontitis      Patient Active Problem List   Diagnosis Date Noted  . Spinal stenosis 01/18/2017  . Insomnia 01/18/2017  . Lumbar herniated disc 09/05/2016  . Cervical radiculopathy 09/05/2016  . Neuropathy 09/05/2016  . Depression 08/15/2016  . Inguinal lymphadenopathy 06/30/2016  . Thrombocytopenia ([place]) 05/24/2016  . Dehydration 05/24/2016  . Anxiety 05/24/2016  . Nausea and vomiting 05/17/2016  . Leukopenia due to antineoplastic chemotherapy ([place]) 05/12/2016  . Hypomagnesemia 05/12/2016  . Hypokalemia 05/12/2016  . Anemia due to antineoplastic chemotherapy 05/04/2016  . Continuous tobacco abuse 04/01/2016  . Poor dentition 04/01/2016  . Essential hypertension 04/01/2016  . Methadone use ([place]) 04/01/2016  . Allergic sinusitis 04/01/2016  . Gastroesophageal reflux disease 04/01/2016  . Cervical cancer ([place]) 03/03/2016    Past Surgical History:  Procedure Laterality Date  . ABCESS DRAINAGE Left 1982   breast  . CARPAL TUNNEL RELEASE Bilateral   . CERVICAL DISC SURGERY  2010  . MULTIPLE EXTRACTIONS WITH ALVEOLOPLASTY N/A 04/08/2016   Procedure: Extraction of tooth #'s [phone] with alveoloplasty  and gross debridement of remaining teeth;  Surgeon: Lenn Cal, DDS;  Location: [facility];  Service: Oral Surgery;  Laterality: N/A;  . TANDEM RING INSERTION N/A 05/26/2016   Procedure: TANDEM RING INSERTION;  Surgeon: [name], MD;  Location: [facility];  Service: Urology;  Laterality: N/A;  . TANDEM RING INSERTION N/A 06/02/2016   Procedure: TANDEM RING INSERTION;  Surgeon: [name], MD;  Location: [facility];  Service: Urology;  Laterality: N/A;  . TANDEM RING INSERTION N/A 06/20/2016   Procedure: TANDEM RING INSERTION;  Surgeon: [name], MD;  Location: [facility];  Service: Urology;  Laterality: N/A;  . TANDEM RING INSERTION N/A 06/23/2016   Procedure: TANDEM RING INSERTION;  Surgeon: [name], MD;  Location: [facility];  Service: Urology;  Laterality: N/A;  . TANDEM RING INSERTION N/A 07/04/2016   Procedure: TANDEM RING INSERTION;  Surgeon: [name], MD;  Location: [facility];  Service: Urology;  Laterality: N/A;     OB History   None      Home Medications    Prior to Admission medications   Medication Sig Start Date End Date Taking? Authorizing Provider  ALPRAZolam (XANAX) 0.25 MG tablet Take 1 tablet (0.25 mg total) by mouth 2 (two) times daily as needed for anxiety. 07/14/17   Bruning, Ashlyn, PA-C  amoxicillin (AMOXIL) 500 MG capsule Take 1 capsule (500 mg total) by mouth 3 (three) times daily. Patient not taking: Reported on 06/26/2017 05/19/17   Charlott Rakes, MD  benzonatate (TESSALON) 100 MG capsule Take 1 capsule (100 mg total) by mouth 2 (two) times daily as needed for cough. 05/19/17   Charlott Rakes, MD  cetirizine (ZYRTEC) 10 MG tablet Take 1 tablet (10 mg total) by mouth daily. 05/19/17   Charlott Rakes, MD  cyclobenzaprine (FLEXERIL) 10 MG tablet Take 1 tablet (10 mg total) by mouth 3 (three) times daily as needed for muscle spasms. 08/19/17   Milton Ferguson, MD  dicyclomine (BENTYL) 20 MG tablet Take 1 tablet (20 mg total) by mouth 2 (two) times daily. 08/17/17   Khatri, Hina, PA-C  famotidine (PEPCID) 20 MG tablet Take 1 tablet (20 mg total) by mouth 2 (two) times daily. 08/17/17   Khatri, Hina, PA-C  fluticasone (FLONASE) 50 MCG/ACT nasal spray Place 1 spray into both nostrils daily. 05/19/17   Charlott Rakes, MD  gabapentin (NEURONTIN) 300 MG capsule Take 2 capsules (600 mg total) by mouth 2 (two) times daily. 05/19/17   Charlott Rakes, MD  HYDROmorphone (DILAUDID) 4 MG tablet Take 1 tablet (4 mg total) by mouth every 6 (six) hours as needed for severe pain. 08/19/17   Milton Ferguson, MD  ibuprofen (ADVIL,MOTRIN) 600 MG tablet Take 1 tablet (600 mg total) by mouth every 8 (eight) hours as needed. 08/11/17   Charlott Rakes, MD  lidocaine (LIDODERM) 5 % Place 1  patch onto the skin daily. Remove & Discard patch within 12 hours or as directed by MD 08/18/17   Charlott Rakes, MD  lisinopril-hydrochlorothiazide (PRINZIDE,ZESTORETIC) 20-25 MG tablet Take 1 tablet by mouth daily. 05/19/17   Charlott Rakes, MD  methadone (DOLOPHINE) 10 MG/5ML solution Take 60 mg by mouth daily.     [provider]  mirtazapine (REMERON) 15 MG tablet Take 1 tablet (15 mg total) by mouth at bedtime. Patient not taking: Reported on 06/26/2017 01/18/17   Charlott Rakes, MD  omeprazole (PRILOSEC) 20 MG capsule Take 1 capsule (20 mg total) by mouth daily. 05/19/17   Charlott Rakes, MD  predniSONE (DELTASONE) 20 MG tablet 3 tabs po day one, then 2 po daily x 4 days 08/10/17   Palumbo, April, MD  prochlorperazine (COMPAZINE) 10 MG tablet Take 1 tablet (10 mg total) by mouth every 6 (six) hours as needed for nausea. 08/15/17   [name], MD  VOLTAREN 1 % GEL Apply 4 g topically 4 (four) times daily. Patient not  taking: Reported on 06/26/2017 03/31/17   Charlott Rakes, MD    Family History Family History  Problem Relation Age of Onset  . Cancer Father   . Cancer Sister        throat  . Cancer Brother        throat and stomach  . Cancer Brother        lung    Social History Social History   Tobacco Use  . Smoking status: Current Every Day Smoker    Packs/day: 0.20    Years: 45.00    Pack years: 9.00    Types: Cigarettes  . Smokeless tobacco: Never Used  . Tobacco comment: 4 cigs daily  Substance Use Topics  . Alcohol use: No  . Drug use: No     Allergies   Codeine; Fentanyl; Hydrocodone-acetaminophen; and Tramadol   Review of Systems Review of Systems  Constitutional: Negative for appetite change and fatigue.  HENT: Negative for congestion, ear discharge and sinus pressure.   Eyes: Negative for discharge.  Respiratory: Negative for cough.   Cardiovascular: Negative for chest pain.  Gastrointestinal: Positive for abdominal pain. Negative for anorexia  and diarrhea.       Left groin pain  Genitourinary: Negative for frequency and hematuria.  Musculoskeletal: Negative for back pain.  Skin: Negative for rash.  Neurological: Negative for seizures and headaches.  Psychiatric/Behavioral: Negative for hallucinations.     Physical Exam Updated Vital Signs BP 139/82   Pulse 91   Temp 98.3 F (36.8 C) (Oral)   Resp 18   SpO2 96%   Physical Exam  Constitutional: She is oriented to person, place, and time. She appears well-developed.  HENT:  Head: Normocephalic.  Eyes: Conjunctivae and EOM are normal. No scleral icterus.  Neck: Neck supple. No thyromegaly present.  Cardiovascular: Normal rate and regular rhythm. Exam reveals no gallop and no friction rub.  No murmur heard. Pulmonary/Chest: No stridor. She has no wheezes. She has no rales. She exhibits no tenderness.  Abdominal: She exhibits no distension. There is no tenderness. There is no rebound.  Moderate left groin tenderness  Musculoskeletal: Normal range of motion. She exhibits no edema.  Lymphadenopathy:    She has no cervical adenopathy.  Neurological: She is oriented to person, place, and time. She exhibits normal muscle tone. Coordination normal.  Skin: No rash noted. No erythema.  Psychiatric: She has a normal mood and affect. Her behavior is normal.     ED Treatments / Results  Labs (all labs ordered are listed, but only abnormal results are displayed) Labs Reviewed  COMPREHENSIVE METABOLIC PANEL - Abnormal; Notable for the following components:      Result Value   Potassium 3.1 (*)    Chloride 98 (*)    Glucose, Bld 122 (*)    Total Protein 8.9 (*)    All other components within normal limits  URINALYSIS, ROUTINE W REFLEX MICROSCOPIC - Abnormal; Notable for the following components:   Hgb urine dipstick SMALL (*)    Leukocytes, UA SMALL (*)    All other components within normal limits  LIPASE, BLOOD  CBC  DIFFERENTIAL    EKG None  Radiology US  Pelvic Complete With Transvaginal  Result Date: 08/19/2017 CLINICAL DATA:  Pelvic pain for 4 days EXAM: TRANSABDOMINAL AND TRANSVAGINAL ULTRASOUND OF PELVIS TECHNIQUE: Both transabdominal and transvaginal ultrasound examinations of the pelvis were performed. Transabdominal technique was performed for global imaging of the pelvis including uterus, ovaries, adnexal regions, and pelvic cul-de-sac.  It was necessary to proceed with endovaginal exam following the transabdominal exam to visualize the endometrium and ovaries. COMPARISON:  CT 08/17/2017, PET-CT 10/20/2016 FINDINGS: Uterus Measurements: 5.4 x 3.2 x 4.7 cm. No fibroids or other mass visualized. Echogenic shadowing foci at the cervix, possible surgical change or corresponding to calcific densities on CT. Endometrium Thickness: 4.9 mm. Small to moderate fluid in the endometrial canal, anechoic, measuring 6 mm thick. Right ovary Measurements: 2.1 x 1.6 x 2.1 cm. Normal appearance/no adnexal mass. Left ovary Measurements: 2 x 1.8 x 1.6 cm.  Normal appearance/no adnexal mass. Other findings No abnormal free fluid. IMPRESSION: Anechoic fluid within the endometrial canal. Otherwise negative pelvic ultrasound. Electronically Signed   By: Donavan Foil M.D.   On: 08/19/2017 18:15    Procedures Procedures (including critical care time)  Medications Ordered in ED Medications  HYDROmorphone (DILAUDID) injection 0.5 mg (0.5 mg Intravenous Given 08/19/17 1647)  ondansetron (ZOFRAN) injection 4 mg (4 mg Intravenous Given 08/19/17 1647)     Initial Impression / Assessment and Plan / ED Course  I have reviewed the triage vital signs and the nursing notes.  Pertinent labs & imaging results that were available during my care of the patient were reviewed by me and considered in my medical decision making (see chart for details).     Patient with left groin pain.  CT of the abdomen a couple days ago was unremarkable ultrasound of the pelvis is unremarkable.   Patient will be given additional pain medicine along with the muscle relaxer and will follow up with her PCP  Final Clinical Impressions(s) / ED Diagnoses   Final diagnoses:  Lt groin pain    ED Discharge Orders        Ordered    cyclobenzaprine (FLEXERIL) 10 MG tablet  3 times daily PRN     08/19/17 1938    HYDROmorphone (DILAUDID) 4 MG tablet  Every 6 hours PRN     08/19/17 Lattie Corns       Milton Ferguson, MD 08/19/17 1947

## 2017-08-29 ENCOUNTER — Telehealth: Payer: Self-pay | Admitting: *Deleted

## 2017-08-29 ENCOUNTER — Other Ambulatory Visit: Payer: Self-pay | Admitting: Family Medicine

## 2017-08-29 ENCOUNTER — Ambulatory Visit
Admission: RE | Admit: 2017-08-29 | Discharge: 2017-08-29 | Disposition: A | Discharge status: Home / Self Care | Payer: Medicaid Other | Source: Ambulatory Visit | Attending: Neurological Surgery | Admitting: Neurological Surgery

## 2017-08-29 DIAGNOSIS — I10 Essential (primary) hypertension: Secondary | ICD-10-CM

## 2017-08-29 DIAGNOSIS — M8448XA Pathological fracture, other site, initial encounter for fracture: Secondary | ICD-10-CM

## 2017-08-29 NOTE — Consult Note (Signed)
Chief Complaint: Patient was seen in consultation today for sacral insufficiency fractures and posterior pelvic pain at the request of Jones,David S  Referring Physician(s): Jones,David S  History of Present Illness: Valerie Kerr is a 57 y.o. female who complains of posterior pelvic pain beginning 2-3 months ago.  She denies any trauma.  She was referred to Dr. Ronnald Ramp.  MRI of the lumbar spine demonstrated bilateral sacral ala fractures.  She also has significant central canal stenosis. At L3-4 and L4-5.  Since the pain started, She has had progressive right lower extremity radicular symptoms into the anterior and lateral right thigh.  The radicular pain has been getting worse over the last two months.  The posterior pelvic pain has been not progressed or significantly improved.  She is now walking with a pain.  She reports her pain as 10/10 VAS relieved to 5/10 with her pain medications.  She is using dilauded and gabapentin.  She is also on methadone.    She was scheduled for an L3-4 epidural at [place], but refused to have the procedure done without sedation.  She is seeing Dr. Brien Few 6/11.  She answered positive to all 24 questions in the [place] Disability Questionnaire  Past Medical History:  Diagnosis Date  . Anxiety   . 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 Chemotherapy-induced thrombocytopenia   . Chronic pain disorder    back,neck-- chronic methadone  . Environmental allergies    allergy to dust mites  . GERD (gastroesophageal reflux disease)   . Headache   . Herniated disc, cervical   . History of external beam radiation therapy 04/12/16-05/31/16   pelvis 45 Gy in 25 fractions, in  30 sessions, pelvis  boost 9 Gy in 5 fractions  . History of radiation therapy 06/20/16-07/04/16   tandem and ring applicator to cervix 28 Gy in 5 fractions  . Hypertension   . Hypokalemia   . Hypomagnesemia    po supplement and IV replacement  . Leukopenia due to antineoplastic chemotherapy ([place])   . Lumbar herniated disc   . Pancytopenia due to chemotherapy ([place])   . Periodontitis     Past Surgical History:  Procedure Laterality Date  . ABCESS DRAINAGE Left 1982   breast  . CARPAL TUNNEL RELEASE Bilateral   . CERVICAL DISC SURGERY  2010  . MULTIPLE EXTRACTIONS WITH ALVEOLOPLASTY N/A 04/08/2016   Procedure: Extraction of tooth #'s [id number] with alveoloplasty  and gross debridement of remaining teeth;  Surgeon: Lenn Cal, DDS;  Location: [place];  Service: Oral Surgery;  Laterality: N/A;  . TANDEM RING INSERTION N/A 05/26/2016   Procedure: TANDEM RING INSERTION;  Surgeon: [name], MD;  Location: [facility];  Service: Urology;  Laterality: N/A;  . TANDEM RING INSERTION N/A 06/02/2016   Procedure: TANDEM RING INSERTION;  Surgeon: [name], MD;  Location: [facility];  Service: Urology;  Laterality: N/A;  . TANDEM RING INSERTION N/A 06/20/2016   Procedure: TANDEM RING INSERTION;  Surgeon: [name], MD;  Location: [facility];  Service: Urology;  Laterality: N/A;  . TANDEM RING INSERTION N/A 06/23/2016   Procedure: TANDEM RING INSERTION;  Surgeon: [name], MD;  Location[facility];  Service: Urology;  Laterality: N/A;  .  TANDEM RING INSERTION N/A 07/04/2016   Procedure: TANDEM RING INSERTION;  Surgeon: [name], MD;  Location: [facility];  Service: Urology;  Laterality: N/A;    Allergies: Codeine; Fentanyl; Hydrocodone-acetaminophen; and Tramadol  Medications: Prior to Admission medications   Medication Sig Start Date End Date Taking? Authorizing Provider  ALPRAZolam (XANAX) 0.25 MG tablet Take 1  tablet (0.25 mg total) by mouth 2 (two) times daily as needed for anxiety. 07/14/17   Bruning, Ashlyn, PA-C  amoxicillin (AMOXIL) 500 MG capsule Take 1 capsule (500 mg total) by mouth 3 (three) times daily. Patient not taking: Reported on 06/26/2017 05/19/17   Charlott Rakes, MD  benzonatate (TESSALON) 100 MG capsule Take 1 capsule (100 mg total) by mouth 2 (two) times daily as needed for cough. 05/19/17   Charlott Rakes, MD  cetirizine (ZYRTEC) 10 MG tablet Take 1 tablet (10 mg total) by mouth daily. 05/19/17   Charlott Rakes, MD  cyclobenzaprine (FLEXERIL) 10 MG tablet Take 1 tablet (10 mg total) by mouth 3 (three) times daily as needed for muscle spasms. 08/19/17   Milton Ferguson, MD  dicyclomine (BENTYL) 20 MG tablet Take 1 tablet (20 mg total) by mouth 2 (two) times daily. 08/17/17   Khatri, Hina, PA-C  doxycycline (VIBRAMYCIN) 100 MG capsule Take 1 capsule (100 mg total) by mouth 2 (two) times daily. One po bid x 7 days 08/19/17   Milton Ferguson, MD  famotidine (PEPCID) 20 MG tablet Take 1 tablet (20 mg total) by mouth 2 (two) times daily. 08/17/17   Khatri, Hina, PA-C  fluticasone (FLONASE) 50 MCG/ACT nasal spray Place 1 spray into both nostrils daily. 05/19/17   Charlott Rakes, MD  gabapentin (NEURONTIN) 300 MG capsule Take 2 capsules (600 mg total) by mouth 2 (two) times daily. 05/19/17   Charlott Rakes, MD  HYDROmorphone (DILAUDID) 4 MG tablet Take 1 tablet (4 mg total) by mouth every 6 (six) hours as needed for severe pain. 08/19/17   Milton Ferguson, MD  ibuprofen (ADVIL,MOTRIN) 600 MG tablet Take 1 tablet (600 mg total) by mouth every 8 (eight) hours as needed. 08/11/17   Charlott Rakes, MD  lidocaine (LIDODERM) 5 % Place 1 patch onto the skin daily. Remove & Discard patch within 12 hours or as directed by MD 08/18/17   Charlott Rakes, MD  lisinopril-hydrochlorothiazide (PRINZIDE,ZESTORETIC) 20-25 MG tablet Take 1 tablet by mouth daily. 05/19/17   Charlott Rakes, MD  methadone (DOLOPHINE) 10  MG/5ML solution Take 60 mg by mouth daily.     [provider]  mirtazapine (REMERON) 15 MG tablet Take 1 tablet (15 mg total) by mouth at bedtime. Patient not taking: Reported on 06/26/2017 01/18/17   Charlott Rakes, MD  omeprazole (PRILOSEC) 20 MG capsule Take 1 capsule (20 mg total) by mouth daily. 05/19/17   Charlott Rakes, MD  predniSONE (DELTASONE) 20 MG tablet 3 tabs po day one, then 2 po daily x 4 days 08/10/17   Palumbo, April, MD  prochlorperazine (COMPAZINE) 10 MG tablet Take 1 tablet (10 mg total) by mouth every 6 (six) hours as needed for nausea. 08/15/17   [name], MD  VOLTAREN 1 % GEL Apply 4 g topically 4 (four) times daily. Patient not taking: Reported on 06/26/2017 03/31/17   Charlott Rakes, MD     Family History  Problem Relation Age of Onset  . Cancer Father   . Cancer Sister        throat  . Cancer Brother  throat and stomach  . Cancer Brother        lung    Social History   Socioeconomic History  . Marital status: Divorced    Spouse name: Not on file  . Number of children: 5  . Years of education: Not on file  . Highest education level: Not on file  Occupational History  . Occupation: disabled  Social Needs  . Financial resource strain: Not on file  . Food insecurity:    Worry: Not on file    Inability: Not on file  . Transportation needs:    Medical: Not on file    Non-medical: Not on file  Tobacco Use  . Smoking status: Current Every Day Smoker    Packs/day: 0.20    Years: 45.00    Pack years: 9.00    Types: Cigarettes  . Smokeless tobacco: Never Used  . Tobacco comment: 4 cigs daily  Substance and Sexual Activity  . Alcohol use: No  . Drug use: No  . Sexual activity: Not Currently  Lifestyle  . Physical activity:    Days per week: Not on file    Minutes per session: Not on file  . Stress: Not on file  Relationships  . Social connections:    Talks on phone: Not on file    Gets together: Not on file    Attends religious  service: Not on file    Active member of club or organization: Not on file    Attends meetings of clubs or organizations: Not on file    Relationship status: Not on file  Other Topics Concern  . Not on file  Social History Narrative   Lives with niece in a 2 story home.  Has 5 children.  On disability.  Formerly worked in Actuary at [employer].  Education: 11th grade.      Review of Systems: A 12 point ROS discussed and pertinent positives are indicated in the HPI above.  All other systems are negative.  Review of Systems  All other systems reviewed and are negative.   Vital Signs: BP (!) 157/75   Pulse 90   Temp 98 F (36.7 C)   Resp 16   Wt 169 lb (76.7 kg)   SpO2 98%   BMI 31.93 kg/m   Physical Exam  Constitutional: She appears well-developed and well-nourished.  Musculoskeletal:       Lumbar back: She exhibits bony tenderness.       Back:  She is tender to palpation over the sacral ala bilaterally.   Neurological: She has normal strength. A sensory deficit is present.  The patient did not exert herself with strength testing due to pain, moreso on the right.  She reported pain with light palpation on the right lower extremity.    Imaging: Ct Abdomen Pelvis W Contrast  Result Date: 08/17/2017 CLINICAL DATA:  Abdominal pain. EXAM: CT ABDOMEN AND PELVIS WITH CONTRAST TECHNIQUE: Multidetector CT imaging of the abdomen and pelvis was performed using the standard protocol following bolus administration of intravenous contrast. CONTRAST:  139mL OMNIPAQUE IOHEXOL 300 MG/ML  SOLN COMPARISON:  PET CT 10/20/2016 FINDINGS: Lower chest: Lung bases are clear. No effusions. Heart is normal size. Hepatobiliary: No focal hepatic abnormality. Gallbladder unremarkable. Pancreas: No focal abnormality or ductal dilatation. Spleen: No focal abnormality.  Normal size. Adrenals/Urinary Tract: No adrenal abnormality. No focal renal abnormality. No stones or hydronephrosis. Urinary  bladder is unremarkable. Stomach/Bowel: Normal appendix. Stomach, large and small bowel grossly unremarkable.  Vascular/Lymphatic: Aortic atherosclerosis. No enlarged abdominal or pelvic lymph nodes. Reproductive: Uterus and adnexa unremarkable.  No mass. Other: No free fluid or free air.  No acute bony abnormality. Musculoskeletal: No acute bony abnormality. IMPRESSION: No acute findings in the abdomen or pelvis. Normal appendix. Aortic atherosclerosis. Electronically Signed   By: Rolm Baptise M.D.   On: 08/17/2017 10:18   US Pelvic Complete With Transvaginal  Result Date: 08/19/2017 CLINICAL DATA:  Pelvic pain for 4 days EXAM: TRANSABDOMINAL AND TRANSVAGINAL ULTRASOUND OF PELVIS TECHNIQUE: Both transabdominal and transvaginal ultrasound examinations of the pelvis were performed. Transabdominal technique was performed for global imaging of the pelvis including uterus, ovaries, adnexal regions, and pelvic cul-de-sac. It was necessary to proceed with endovaginal exam following the transabdominal exam to visualize the endometrium and ovaries. COMPARISON:  CT 08/17/2017, PET-CT 10/20/2016 FINDINGS: Uterus Measurements: 5.4 x 3.2 x 4.7 cm. No fibroids or other mass visualized. Echogenic shadowing foci at the cervix, possible surgical change or corresponding to calcific densities on CT. Endometrium Thickness: 4.9 mm. Small to moderate fluid in the endometrial canal, anechoic, measuring 6 mm thick. Right ovary Measurements: 2.1 x 1.6 x 2.1 cm. Normal appearance/no adnexal mass. Left ovary Measurements: 2 x 1.8 x 1.6 cm.  Normal appearance/no adnexal mass. Other findings No abnormal free fluid. IMPRESSION: Anechoic fluid within the endometrial canal. Otherwise negative pelvic ultrasound. Electronically Signed   By: Donavan Foil M.D.   On: 08/19/2017 18:15    Labs:  CBC: Recent Labs    08/09/17 2208 08/16/17 2319 08/19/17 1548  WBC 7.2 9.9 8.2  HGB 12.8 12.6 13.1  HCT 40.4 39.5 39.7  PLT 262 278 257     COAGS: No results for input(s): INR, APTT in the last 8760 hours.  BMP: Recent Labs    08/09/17 2208 08/16/17 2319 08/19/17 1548  NA 140 140 139  K 3.9 3.7 3.1*  CL 101 98* 98*  CO2 29 31 30   GLUCOSE 85 99 122*  BUN 14 15 14   CALCIUM 9.4 9.4 9.5  CREATININE 0.99 0.90 0.88  GFRNONAA >60 >60 >60  GFRAA >60 >60 >60    Assessment and Plan:  Although the patient does have bilateral sacral fractures on MRI 07/21/17, her symptoms appear to be mostly radicular in nature.  I am not confident sacroplasty will significantly improve her pain.    I would recommend bone density testing as planned.  I would also recommend a trial of epidural steroids in the lumbar spine to treat her radicular symptoms.  Once this is better controlled, she could be reassessed for pain from her sacral fractures.  I would recommend a repeat sacral MRI at that time to assess healing prior to any contemplation of sacral augmentation.  Thank you for this interesting consult.  I greatly enjoyed meeting Audyn Dimercurio and look forward to participating in their care.  A copy of this report was sent to the requesting provider on this date.  Electronically Signed: Arna Snipe, MD 08/29/2017, 3:10 PM   I spent a total of  15 Minutes   in face to face in clinical consultation, greater than 50% of which was counseling/coordinating care for Mrs. Alroy Dust

## 2017-08-29 NOTE — Telephone Encounter (Signed)
Returned the patient's call and scheduled the follow up appt for July 19th.

## 2017-08-30 ENCOUNTER — Other Ambulatory Visit: Payer: Self-pay | Admitting: Radiation Oncology

## 2017-08-30 ENCOUNTER — Telehealth: Payer: Self-pay

## 2017-08-30 MED ORDER — HYDROMORPHONE HCL 4 MG PO TABS: 4.0000 mg | ORAL_TABLET | Freq: Four times a day (QID) | ORAL | 0 refills | Status: DC | PRN

## 2017-08-30 NOTE — Telephone Encounter (Signed)
Called pt to let her know Dr. Sondra Come had refilled her Dilauded 4 mg tablet prescription, but that she would have to come pick up the hard copy. Pt stated she would not be able to come pick up the prescription because she has to set up [employer] transportation 3 days in advance and she will be out of town tomorrow evening for her granddaughter's graduation. Informed Dr. Sondra Come, who was able to send a new prescription for 40 tablets to the pt's pharmacy on file (20 tablet hard copy prescription voided and shredded). Updated pt on status and that her pharmacy should be calling her later today when the prescription is ready for a family member to pick up. Told pt to call back should she have any further questions/cocnerns. Verbalized understanding and agreement.

## 2017-09-13 ENCOUNTER — Other Ambulatory Visit: Payer: Self-pay | Admitting: Radiation Oncology

## 2017-09-13 ENCOUNTER — Telehealth: Payer: Self-pay

## 2017-09-13 DIAGNOSIS — C531 Malignant neoplasm of exocervix: Secondary | ICD-10-CM

## 2017-09-13 MED ORDER — PROCHLORPERAZINE MALEATE 10 MG PO TABS: 10.0000 mg | ORAL_TABLET | Freq: Four times a day (QID) | ORAL | 0 refills | Status: DC | PRN

## 2017-09-13 MED ORDER — HYDROMORPHONE HCL 4 MG PO TABS: 4.0000 mg | ORAL_TABLET | Freq: Four times a day (QID) | ORAL | 0 refills | Status: DC | PRN

## 2017-09-13 NOTE — Telephone Encounter (Signed)
Contacted pt to convey that Dr. Isidore Moos had refilled her Dilaudid and Compazine with quantity enough to last until Dr. Sondra Come has returned from vacation. Pt verbalized understanding. Loma Sousa, RN BSN

## 2017-09-15 ENCOUNTER — Telehealth: Payer: Self-pay | Admitting: *Deleted

## 2017-09-15 NOTE — Telephone Encounter (Signed)
Patient called back and moved her appt from July 19th to July 23rd

## 2017-09-15 NOTE — Telephone Encounter (Signed)
Called and left the patient a message to call the office back. Need to move the patient's appt from July 19th to July 23rd

## 2017-09-21 ENCOUNTER — Other Ambulatory Visit: Payer: Self-pay | Admitting: Radiation Oncology

## 2017-09-21 DIAGNOSIS — C531 Malignant neoplasm of exocervix: Secondary | ICD-10-CM

## 2017-09-21 MED ORDER — HYDROMORPHONE HCL 4 MG PO TABS: 4.0000 mg | ORAL_TABLET | Freq: Four times a day (QID) | ORAL | 0 refills | Status: DC | PRN

## 2017-09-25 ENCOUNTER — Other Ambulatory Visit: Payer: Self-pay | Admitting: Family Medicine

## 2017-09-25 DIAGNOSIS — G629 Polyneuropathy, unspecified: Secondary | ICD-10-CM

## 2017-09-26 ENCOUNTER — Inpatient Hospital Stay
Admission: RE | Admit: 2017-09-26 | Discharge: 2017-09-26 | Disposition: A | Discharge status: Home / Self Care | Payer: Self-pay | Source: Ambulatory Visit | Attending: Neurological Surgery | Admitting: Neurological Surgery

## 2017-09-29 ENCOUNTER — Other Ambulatory Visit: Payer: Self-pay | Admitting: Radiation Oncology

## 2017-09-29 ENCOUNTER — Other Ambulatory Visit: Payer: Self-pay | Admitting: Family Medicine

## 2017-09-29 DIAGNOSIS — C531 Malignant neoplasm of exocervix: Secondary | ICD-10-CM

## 2017-09-29 DIAGNOSIS — M48062 Spinal stenosis, lumbar region with neurogenic claudication: Secondary | ICD-10-CM

## 2017-10-10 ENCOUNTER — Telehealth: Payer: Self-pay | Admitting: *Deleted

## 2017-10-10 NOTE — Telephone Encounter (Signed)
Returned the patient's call and gave the appt for 7/23

## 2017-10-12 ENCOUNTER — Other Ambulatory Visit: Payer: Self-pay | Admitting: Radiation Oncology

## 2017-10-12 DIAGNOSIS — C531 Malignant neoplasm of exocervix: Secondary | ICD-10-CM

## 2017-10-12 DIAGNOSIS — F418 Other specified anxiety disorders: Secondary | ICD-10-CM

## 2017-10-12 MED ORDER — PROCHLORPERAZINE MALEATE 10 MG PO TABS: 10.0000 mg | ORAL_TABLET | Freq: Four times a day (QID) | ORAL | 0 refills | Status: DC | PRN

## 2017-10-12 MED ORDER — HYDROMORPHONE HCL 4 MG PO TABS: 4.0000 mg | ORAL_TABLET | Freq: Four times a day (QID) | ORAL | 0 refills | Status: DC | PRN

## 2017-10-12 MED ORDER — ALPRAZOLAM 0.25 MG PO TABS: 0.2500 mg | ORAL_TABLET | Freq: Two times a day (BID) | ORAL | 0 refills | Status: DC | PRN

## 2017-10-13 ENCOUNTER — Ambulatory Visit: Payer: Self-pay | Admitting: Gynecologic Oncology

## 2017-10-17 ENCOUNTER — Ambulatory Visit (HOSPITAL_COMMUNITY)
Admission: RE | Admit: 2017-10-17 | Discharge: 2017-10-17 | Disposition: A | Discharge status: Home / Self Care | Payer: Medicaid Other | Source: Ambulatory Visit | Attending: Gynecologic Oncology | Admitting: Gynecologic Oncology

## 2017-10-17 ENCOUNTER — Emergency Department (HOSPITAL_COMMUNITY): Payer: Medicaid Other

## 2017-10-17 ENCOUNTER — Inpatient Hospital Stay: Payer: Medicaid Other | Attending: Gynecologic Oncology | Admitting: Gynecologic Oncology

## 2017-10-17 ENCOUNTER — Encounter (HOSPITAL_COMMUNITY): Payer: Self-pay | Admitting: Emergency Medicine

## 2017-10-17 ENCOUNTER — Emergency Department (HOSPITAL_COMMUNITY)
Admission: EM | Admit: 2017-10-17 | Discharge: 2017-10-17 | Disposition: A | Discharge status: Home / Self Care | Payer: Medicaid Other | Attending: Emergency Medicine | Admitting: Emergency Medicine

## 2017-10-17 ENCOUNTER — Other Ambulatory Visit: Payer: Self-pay

## 2017-10-17 ENCOUNTER — Encounter: Payer: Self-pay | Admitting: Gynecologic Oncology

## 2017-10-17 ENCOUNTER — Encounter: Payer: Self-pay | Admitting: Physician Assistant

## 2017-10-17 VITALS — BP 153/92 | HR 93 | Temp 98.7°F | Resp 22 | Ht 61.5 in | Wt 163.0 lb

## 2017-10-17 DIAGNOSIS — Z923 Personal history of irradiation: Secondary | ICD-10-CM | POA: Insufficient documentation

## 2017-10-17 DIAGNOSIS — R198 Other specified symptoms and signs involving the digestive system and abdomen: Secondary | ICD-10-CM | POA: Diagnosis present

## 2017-10-17 DIAGNOSIS — R1319 Other dysphagia: Secondary | ICD-10-CM

## 2017-10-17 DIAGNOSIS — R1310 Dysphagia, unspecified: Secondary | ICD-10-CM | POA: Diagnosis not present

## 2017-10-17 DIAGNOSIS — Z79899 Other long term (current) drug therapy: Secondary | ICD-10-CM | POA: Insufficient documentation

## 2017-10-17 DIAGNOSIS — I10 Essential (primary) hypertension: Secondary | ICD-10-CM | POA: Insufficient documentation

## 2017-10-17 DIAGNOSIS — Z9221 Personal history of antineoplastic chemotherapy: Secondary | ICD-10-CM | POA: Insufficient documentation

## 2017-10-17 DIAGNOSIS — C530 Malignant neoplasm of endocervix: Secondary | ICD-10-CM | POA: Diagnosis present

## 2017-10-17 DIAGNOSIS — F17210 Nicotine dependence, cigarettes, uncomplicated: Secondary | ICD-10-CM | POA: Insufficient documentation

## 2017-10-17 DIAGNOSIS — Z8541 Personal history of malignant neoplasm of cervix uteri: Secondary | ICD-10-CM | POA: Insufficient documentation

## 2017-10-17 LAB — COMPREHENSIVE METABOLIC PANEL
ALT: 30 U/L (ref 0–44)
AST: 28 U/L (ref 15–41)
Albumin: 3.9 g/dL (ref 3.5–5.0)
Alkaline Phosphatase: 91 U/L (ref 38–126)
Anion gap: 14 (ref 5–15)
BUN: 17 mg/dL (ref 6–20)
CHLORIDE: 98 mmol/L (ref 98–111)
CO2: 29 mmol/L (ref 22–32)
Calcium: 9.2 mg/dL (ref 8.9–10.3)
Creatinine, Ser: 0.75 mg/dL (ref 0.44–1.00)
Glucose, Bld: 92 mg/dL (ref 70–99)
Potassium: 3.3 mmol/L — ABNORMAL LOW (ref 3.5–5.1)
SODIUM: 141 mmol/L (ref 135–145)
Total Bilirubin: 0.5 mg/dL (ref 0.3–1.2)
Total Protein: 8.2 g/dL — ABNORMAL HIGH (ref 6.5–8.1)

## 2017-10-17 LAB — CBC WITH DIFFERENTIAL/PLATELET
BASOS ABS: 0 10*3/uL (ref 0.0–0.1)
BASOS PCT: 0 %
Eosinophils Absolute: 0 10*3/uL (ref 0.0–0.7)
Eosinophils Relative: 0 %
HEMATOCRIT: 40.4 % (ref 36.0–46.0)
HEMOGLOBIN: 12.9 g/dL (ref 12.0–15.0)
LYMPHS PCT: 17 %
Lymphs Abs: 1.6 10*3/uL (ref 0.7–4.0)
MCH: 28.9 pg (ref 26.0–34.0)
MCHC: 31.9 g/dL (ref 30.0–36.0)
MCV: 90.6 fL (ref 78.0–100.0)
MONO ABS: 0.6 10*3/uL (ref 0.1–1.0)
MONOS PCT: 6 %
NEUTROS ABS: 7.4 10*3/uL (ref 1.7–7.7)
NEUTROS PCT: 77 %
Platelets: 296 10*3/uL (ref 150–400)
RBC: 4.46 MIL/uL (ref 3.87–5.11)
RDW: 13.3 % (ref 11.5–15.5)
WBC: 9.7 10*3/uL (ref 4.0–10.5)

## 2017-10-17 LAB — LIPASE, BLOOD: LIPASE: 21 U/L (ref 11–51)

## 2017-10-17 MED ORDER — ACETAMINOPHEN 500 MG PO TABS
1000.0000 mg | ORAL_TABLET | Freq: Once | ORAL | Status: AC
  Administered 2017-10-17: 1000 mg via ORAL
  Filled 2017-10-17: qty 2

## 2017-10-17 MED ORDER — ALUM & MAG HYDROXIDE-SIMETH 200-200-20 MG/5ML PO SUSP
30.0000 mL | Freq: Once | ORAL | Status: AC
  Administered 2017-10-17: 30 mL via ORAL
  Filled 2017-10-17: qty 30

## 2017-10-17 MED ORDER — GI COCKTAIL ~~LOC~~: 30.0000 mL | Freq: Once | ORAL | Status: DC

## 2017-10-17 NOTE — Patient Instructions (Signed)
Dr [name] will check a chest xray today given your symptoms.  Valerie Kerr has referred you to a gastroenterology doctor to evaluate your symptoms of unable to belch.  In the meantime it is important that you establish care with Dr Margarita Rana (your primary care doctor).  Dr [name] will see you back in January, 2020. Please ask Dr Clabe Seal office to schedule that appointment after he sees you in October.  Please notify Dr [name] at phone number [phone] if you notice vaginal bleeding, new pelvic or abdominal pains, bloating, feeling full easy, or a change in bladder or bowel function.

## 2017-10-17 NOTE — ED Notes (Signed)
Pt states she does not want throat to be numbed. Refused GI cocktail

## 2017-10-17 NOTE — Progress Notes (Signed)
Follow-up Note: Gyn-Onc  Consult was requested by Dr. [name] for the evaluation of Valerie Kerr 57 y.o. female  CC:  Chief Complaint  Patient presents with  . Malignant neoplasm of endocervix [facility])    Assessment/Plan:  [name]  is a 57 y.o.  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  Post treatment PET showed excellent response at cervix. No evidence of persistent disease on today's exam.  I will see her back in January, 2020 (she will see Dr Sondra Come in October, 2019) to follow-up the cervical cancer surveillance.  Dysphagia (esophageal) : referral to GI ([facility]). CXR today to rule out foreign body/respiratory process. I recommended she see her PCP to establish care.  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  The patient reports last having a pap smear approximately 4 years ago in [place] which, was followed by a biopsy which, per patient, was "normal" and she was not informed that she would require special followup. She began experiencing postmenopausal bleeding at the beginning of 2016 and continued to bleed intermittently for 2 years.   She presented to the ER in [place] in November (25th), 2017 and a cervical mass was identified on pelvic exam.  She was seen in the office by Dr Baron Sane 03/01/16 who performed cervical biopsies of a friable mass which revealed poorly differentiated squamous cell carcinoma. Attempts were made to notify her of this result on 03/03/16, however a phone call was left to call back and there appears to be delay in her receiving the information about her result.  She presented to the office to be seen by Dr [name] for discussion regarding results on 03/16/16 and was informed of the  results and the need to see oncology.   A PET had been ordered however, the patient cancelled this due to claustrophobia and fear of having a scan and getting bad results.  On 04/04/16 she underwent pretreatment PET which showed 5.5 cm hypermetabolic cervical mass, consistent with known primary cervical carcinoma. No definite local or distant metastatic disease.Marland Kitchen  She was treated with primary radiation therapy and radiosensitizing chemotherapy with cisplatin between 04/12/16 and 06/20/16 with 45Gy to the pelvis with 9Gy boost, and 28 Gy additionally to the cervix with intracavitary brachytherapy.  Post treatment imaging on 06/29/16 showed excellent response at cervix but a new slightly enlarged PET avid right inguinal node was present.  Follow-up repeat PET on 10/20/16 showed complete resolution of PET avid findings at the cervix and inguinal node with no apparent malignant disease present.  Interval Hx:   Since completing therapy she reports profound weakness and fatigue, though this was present before her treatments and her lab work and exams do not show a clear organic explanation for these symptoms. It may be secondary to depression. She is tearful and depressed in her mood. She has severe back pain. She cannot work due to her symptoms.  She has 2 days of symptoms of unable to wear belch, "feels like something is stuck in there".  She does not have a regular scheduled visit with her PCP, Dr Margarita Rana.   No symptoms concerning for cervical cancer recurrence.   Current Meds:  Outpatient Encounter Medications as of 10/17/2017  Medication Sig  . ALPRAZolam (XANAX) 0.25 MG tablet Take  1 tablet (0.25 mg total) by mouth 2 (two) times daily as needed for anxiety.  . cetirizine (ZYRTEC) 10 MG tablet Take 1 tablet (10 mg total) by mouth daily.  . cyclobenzaprine (FLEXERIL) 10 MG tablet Take 1 tablet (10 mg total) by mouth 3 (three) times daily as needed for muscle spasms.  . fluticasone (FLONASE) 50 MCG/ACT  nasal spray Place 1 spray into both nostrils daily.  Marland Kitchen gabapentin (NEURONTIN) 300 MG capsule TAKE TWO CAPSULES BY MOUTH TWICE DAILY  . HYDROmorphone (DILAUDID) 4 MG tablet Take 1 tablet (4 mg total) by mouth every 6 (six) hours as needed for severe pain.  Marland Kitchen ibuprofen (IBU) 600 MG tablet Take 1 tablet (600 mg total) by mouth every 8 (eight) hours as needed. MUST MAKE APPT FOR FURTHER REFILLS  . lidocaine (LIDODERM) 5 % Place 1 patch onto the skin daily. Remove & Discard patch within 12 hours or as directed by MD  . lisinopril-hydrochlorothiazide (PRINZIDE,ZESTORETIC) 20-25 MG tablet TAKE ONE TABLET BY MOUTH EVERY DAY  . methadone (DOLOPHINE) 10 MG/5ML solution Take 60 mg by mouth daily.   . mirtazapine (REMERON) 15 MG tablet Take 1 tablet (15 mg total) by mouth at bedtime.  Marland Kitchen omeprazole (PRILOSEC) 20 MG capsule Take 1 capsule (20 mg total) by mouth daily.  . prochlorperazine (COMPAZINE) 10 MG tablet Take 1 tablet (10 mg total) by mouth every 6 (six) hours as needed for nausea.  Marland Kitchen VOLTAREN 1 % GEL Apply 4 g topically 4 (four) times daily.  Marland Kitchen dicyclomine (BENTYL) 20 MG tablet Take 1 tablet (20 mg total) by mouth 2 (two) times daily. (Patient not taking: Reported on 10/17/2017)  . [DISCONTINUED] amoxicillin (AMOXIL) 500 MG capsule Take 1 capsule (500 mg total) by mouth 3 (three) times daily. (Patient not taking: Reported on 10/17/2017)  . [DISCONTINUED] benzonatate (TESSALON) 100 MG capsule Take 1 capsule (100 mg total) by mouth 2 (two) times daily as needed for cough. (Patient not taking: Reported on 10/17/2017)  . [DISCONTINUED] doxycycline (VIBRAMYCIN) 100 MG capsule Take 1 capsule (100 mg total) by mouth 2 (two) times daily. One po bid x 7 days (Patient not taking: Reported on 10/17/2017)  . [DISCONTINUED] famotidine (PEPCID) 20 MG tablet Take 1 tablet (20 mg total) by mouth 2 (two) times daily. (Patient not taking: Reported on 10/17/2017)  . [DISCONTINUED] predniSONE (DELTASONE) 20 MG tablet 3 tabs po  day one, then 2 po daily x 4 days (Patient not taking: Reported on 10/17/2017)   No facility-administered encounter medications on file as of 10/17/2017.     Allergy:  Allergies  Allergen Reactions  . Codeine Itching and Rash  . Fentanyl Rash    Skin rash, local swelling from patch  . Hydrocodone-Acetaminophen Rash  . Tramadol Other (See Comments)    GI upset,also trembling sensation    Social Hx:   Social History   Socioeconomic History  . Marital status: Divorced    Spouse name: Not on file  . Number of children: 5  . Years of education: Not on file  . Highest education level: Not on file  Occupational History  . Occupation: disabled  Social Needs  . Financial resource strain: Not on file  . Food insecurity:    Worry: Not on file    Inability: Not on file  . Transportation needs:    Medical: Not on file    Non-medical: Not on file  Tobacco Use  . Smoking status: Current Every Day Smoker    Packs/day: 0.20  Years: 45.00    Pack years: 9.00    Types: Cigarettes  . Smokeless tobacco: Never Used  . Tobacco comment: 4 cigs daily  Substance and Sexual Activity  . Alcohol use: No  . Drug use: No  . Sexual activity: Not Currently  Lifestyle  . Physical activity:    Days per week: Not on file    Minutes per session: Not on file  . Stress: Not on file  Relationships  . Social connections:    Talks on phone: Not on file    Gets together: Not on file    Attends religious service: Not on file    Active member of club or organization: Not on file    Attends meetings of clubs or organizations: Not on file    Relationship status: Not on file  . Intimate partner violence:    Fear of current or ex partner: Not on file    Emotionally abused: Not on file    Physically abused: Not on file    Forced sexual activity: Not on file  Other Topics Concern  . Not on file  Social History Narrative   Lives with niece in a 2 story home.  Has 5 children.  On disability.  Formerly  worked in Actuary at [employer].  Education: 11th grade.     Past Surgical Hx:  Past Surgical History:  Procedure Laterality Date  . ABCESS DRAINAGE Left 1982   breast  . CARPAL TUNNEL RELEASE Bilateral   . CERVICAL DISC SURGERY  2010  . MULTIPLE EXTRACTIONS WITH ALVEOLOPLASTY N/A 04/08/2016   Procedure: Extraction of tooth #'s [id number] with alveoloplasty  and gross debridement of remaining teeth;  Surgeon: Lenn Cal, DDS;  Location: [facility];  Service: Oral Surgery;  Laterality: N/A;  . TANDEM RING INSERTION N/A 05/26/2016   Procedure: TANDEM RING INSERTION;  Surgeon: [name], MD;  Location: [facility];  Service: Urology;  Laterality: N/A;  . TANDEM RING INSERTION N/A 06/02/2016   Procedure: TANDEM RING INSERTION;  Surgeon: [name], MD;  Location: [facility];  Service: Urology;  Laterality: N/A;  . TANDEM RING INSERTION N/A 06/20/2016   Procedure: TANDEM RING INSERTION;  Surgeon: [name], MD;  Location: [facility];  Service: Urology;  Laterality: N/A;  . TANDEM RING INSERTION N/A 06/23/2016   Procedure: TANDEM RING INSERTION;  Surgeon: [name], MD;  Location: [facility];  Service: Urology;  Laterality: N/A;  . TANDEM RING INSERTION N/A 07/04/2016   Procedure: TANDEM RING INSERTION;  Surgeon: [name], MD;  Location: [facility];  Service: Urology;  Laterality: N/A;    Past Medical Hx:  Past Medical History:  Diagnosis Date  . Anxiety   . Cervical cancer [facility]) oncologist-  dr gorsuch/ dr Sondra Come   02-20-2016 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---  treatment concurrent chemo (week 6 on hold due to pancytopenia) and radiation therapy (external beam 04-12-2016 to 05-31-2016)  started high-dose rate brachytherapy 05-26-2016  . Chemotherapy-induced thrombocytopenia   . Chronic pain disorder    back,neck-- chronic methadone  . Environmental allergies    allergy  to dust mites  . GERD (gastroesophageal reflux disease)   . Headache   . Herniated disc, cervical   . History of external beam radiation therapy 04/12/16-05/31/16   pelvis 45 Gy in 25 fractions, in  30 sessions, pelvis boost 9 Gy in 5 fractions  . History of radiation therapy 06/20/16-07/04/16  tandem and ring applicator to cervix 28 Gy in 5 fractions  . Hypertension   . Hypokalemia   . Hypomagnesemia    po supplement and IV replacement  . Leukopenia due to antineoplastic chemotherapy ([place])   . Lumbar herniated disc   . Pancytopenia due to chemotherapy ([place])   . Periodontitis     Past Gynecological History:  Denies history of abnormal paps. Last pap was 4 years ago in [place] per patient. SVD x 5 No LMP recorded. Patient is postmenopausal.  Family Hx:  Family History  Problem Relation Age of Onset  . Cancer Father   . Cancer Sister        throat  . Cancer Brother        throat and stomach  . Cancer Brother        lung    Review of Systems:  Constitutional  Feels fatigue, sadness, generalized weakness, anxious.    ENT Normal appearing ears and nares bilaterally Skin/Breast  No rash, sores, jaundice, itching, dryness Cardiovascular  No chest pain, shortness of breath, or edema  Pulmonary  No cough or wheeze.  Gastro Intestinal  + new symptom of "unable to belch" "feel like something is stuck in there". No nausea, vomitting, or diarrhoea. No bright red blood per rectum, no abdominal pain, change in bowel movement.  Genito Urinary  No frequency, urgency, dysuria, . Musculo Skeletal  + myalgia, arthralgia, pain Neurologic  No weakness, numbness, change in gait,  Psychology  No depression, anxiety, insomnia.   Vitals:  Blood pressure (!) 153/92, pulse 93, temperature 98.7 F (37.1 C), temperature source Oral, resp. rate (!) 22, height 5' 1.5" (1.562 m), weight 163 lb (73.9 kg).  Physical Exam: WD in NAD Neck  Supple NROM, without any enlargements.  Lymph Node  Survey No cervical supraclavicular or inguinal adenopathy Cardiovascular  Pulse normal rate, regularity and rhythm. S1 and S2 normal.  Lungs  Clear to auscultation bilateraly, without wheezes/crackles/rhonchi. Good air movement.  Skin  No rash/lesions/breakdown  Psychiatry  Alert and oriented to person, place, and time  Abdomen  Normoactive bowel sounds, abdomen soft, non-tender and nonobese without evidence of hernia.  Back No CVA tenderness Genito Urinary  Vulva/vagina: Normal external female genitalia.  No lesions. No discharge or bleeding.  Bladder/urethra:  No lesions or masses, well supported bladder  Vagina: grossly normal  Cervix: The cervix is grossly normal. No visible or palpable residual lesion  Uterus:  Small, mobile,  Adnexa: no masses. Rectal  No lesions or masses Extremities  No bilateral cyanosis, clubbing or edema.   Thereasa Solo, MD  10/17/2017, 3:05 PM    Lunch

## 2017-10-17 NOTE — ED Notes (Signed)
Pt had a cup of soda done well

## 2017-10-17 NOTE — ED Provider Notes (Signed)
[place] DEPT Provider Note   CSN: [id number] Arrival date & time: 10/17/17  1541     History   Chief Complaint Chief Complaint  Patient presents with  . Has Not Belched In 3 Days    HPI Valerie Kerr is a 58 y.o. female.  Patient is a 57 year old female with a history of prior endocervical cancer, hypertension and chronic neck pain who presents with inability to balance.  She states for the last 3 days she had a discomfort in her esophagus.  She feels like she has air trapped in there and she cannot get it out.  She states that she only could burp that she would feel better.  She says this is been fairly constant for the last 3 days.  She does have a history of reflux and takes omeprazole.  She does complain of some increased reflux symptoms.  She denies any abdominal pain.  No nausea or vomiting.  She is having normal bowel movements.  She feels like when she swallows things they sometimes get stuck.  She did eat a sandwich last night without difficulty.  She denies any known fevers.  No shortness of breath.  No chest pain other than discomfort in the center of her chest which she relates to her inability to belch.  She states she had similar feelings in the past but has not lasted this long.  She was at her oncologist office this morning.  They did a chest x-ray and have referred her to follow-up with [facility] gastroenterology on August 1.     Past Medical History:  Diagnosis Date  . Anxiety   . 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 Chemotherapy-induced thrombocytopenia   . Chronic pain disorder    back,neck-- chronic methadone  . Environmental allergies    allergy to dust mites    . GERD (gastroesophageal reflux disease)   . Headache   . Herniated disc, cervical   . History of external beam radiation therapy 04/12/16-05/31/16   pelvis 45 Gy in 25 fractions, in  30 sessions, pelvis boost 9 Gy in 5 fractions  . History of radiation therapy 06/20/16-07/04/16   tandem and ring applicator to cervix 28 Gy in 5 fractions  . Hypertension   . Hypokalemia   . Hypomagnesemia    po supplement and IV replacement  . Leukopenia due to antineoplastic chemotherapy ([place])   . Lumbar herniated disc   . Pancytopenia due to chemotherapy ([place])   . Periodontitis     Patient Active Problem List   Diagnosis Date Noted  . Spinal stenosis 01/18/2017  . Insomnia 01/18/2017  . Lumbar herniated disc 09/05/2016  . Cervical radiculopathy 09/05/2016  . Neuropathy 09/05/2016  . Depression 08/15/2016  . Inguinal lymphadenopathy 06/30/2016  . Thrombocytopenia ([place]) 05/24/2016  . Dehydration 05/24/2016  . Anxiety 05/24/2016  . Nausea and vomiting 05/17/2016  . Leukopenia due to antineoplastic chemotherapy ([place]) 05/12/2016  . Hypomagnesemia 05/12/2016  . Hypokalemia 05/12/2016  . Anemia due to antineoplastic chemotherapy 05/04/2016  . Continuous tobacco abuse 04/01/2016  . Poor dentition 04/01/2016  . Essential hypertension 04/01/2016  . Methadone use ([place]) 04/01/2016  . Allergic sinusitis 04/01/2016  . Gastroesophageal reflux disease 04/01/2016  . Cervical cancer ([place]) 03/03/2016  Past Surgical History:  Procedure Laterality Date  . ABCESS DRAINAGE Left 1982   breast  . CARPAL TUNNEL RELEASE Bilateral   . CERVICAL DISC SURGERY  2010  . MULTIPLE EXTRACTIONS WITH ALVEOLOPLASTY N/A 04/08/2016   Procedure: Extraction of tooth #'s [phone] with alveoloplasty  and gross debridement of remaining teeth;  Surgeon: Lenn Cal, DDS;  Location: [place];  Service: Oral Surgery;  Laterality: N/A;  . TANDEM RING INSERTION N/A 05/26/2016   Procedure: TANDEM RING INSERTION;  Surgeon:  [name], MD;  Location: [facility];  Service: Urology;  Laterality: N/A;  . TANDEM RING INSERTION N/A 06/02/2016   Procedure: TANDEM RING INSERTION;  Surgeon: [name], MD;  Location: [facility];  Service: Urology;  Laterality: N/A;  . TANDEM RING INSERTION N/A 06/20/2016   Procedure: TANDEM RING INSERTION;  Surgeon: [name], MD;  Location: [facility];  Service: Urology;  Laterality: N/A;  . TANDEM RING INSERTION N/A 06/23/2016   Procedure: TANDEM RING INSERTION;  Surgeon: [name], MD;  Location: [facility];  Service: Urology;  Laterality: N/A;  . TANDEM RING INSERTION N/A 07/04/2016   Procedure: TANDEM RING INSERTION;  Surgeon: [name], MD;  Location: [facility];  Service: Urology;  Laterality: N/A;     OB History   None      Home Medications    Prior to Admission medications   Medication Sig Start Date End Date Taking? Authorizing Provider  ALPRAZolam (XANAX) 0.25 MG tablet Take 1 tablet (0.25 mg total) by mouth 2 (two) times daily as needed for anxiety. 10/12/17  Yes [name], MD  cetirizine (ZYRTEC) 10 MG tablet Take 1 tablet (10 mg total) by mouth daily. 05/19/17  Yes Newlin, Charlane Ferretti, MD  fluticasone (FLONASE) 50 MCG/ACT nasal spray Place 1 spray into both nostrils daily. 05/19/17  Yes Newlin, Charlane Ferretti, MD  gabapentin (NEURONTIN) 300 MG capsule TAKE TWO CAPSULES BY MOUTH TWICE DAILY 09/26/17  Yes Newlin, Enobong, MD  HYDROmorphone (DILAUDID) 4 MG tablet Take 1 tablet (4 mg total) by mouth every 6 (six) hours as needed for severe pain. 10/12/17  Yes [name], MD  ibuprofen (IBU) 600 MG tablet Take 1 tablet (600 mg total) by mouth every 8 (eight) hours as needed. MUST MAKE APPT FOR FURTHER REFILLS 09/29/17  Yes Newlin, Enobong, MD  lidocaine (LIDODERM) 5 % Place 1 patch onto the skin daily. Remove & Discard patch within 12 hours or as directed by MD 08/18/17  Yes Charlott Rakes, MD    lisinopril-hydrochlorothiazide (PRINZIDE,ZESTORETIC) 20-25 MG tablet TAKE ONE TABLET BY MOUTH EVERY DAY 08/30/17  Yes Charlott Rakes, MD  methadone (DOLOPHINE) 10 MG/5ML solution Take 60 mg by mouth daily.    Yes [provider]  mirtazapine (REMERON) 15 MG tablet Take 1 tablet (15 mg total) by mouth at bedtime. 01/18/17  Yes Charlott Rakes, MD  omeprazole (PRILOSEC) 20 MG capsule Take 1 capsule (20 mg total) by mouth daily. 05/19/17  Yes Charlott Rakes, MD  prochlorperazine (COMPAZINE) 10 MG tablet Take 1 tablet (10 mg total) by mouth every 6 (six) hours as needed for nausea. 10/12/17  Yes [name], MD  VOLTAREN 1 % GEL Apply 4 g topically 4 (four) times daily. 03/31/17  Yes Charlott Rakes, MD  cyclobenzaprine (FLEXERIL) 10 MG tablet Take 1 tablet (10 mg total) by mouth 3 (three) times daily as needed for muscle spasms. Patient not taking: Reported on 10/17/2017 08/19/17   Milton Ferguson,  MD  dicyclomine (BENTYL) 20 MG tablet Take 1 tablet (20 mg total) by mouth 2 (two) times daily. Patient not taking: Reported on 10/17/2017 08/17/17   Delia Heady, PA-C    Family History Family History  Problem Relation Age of Onset  . Cancer Father   . Cancer Sister        throat  . Cancer Brother        throat and stomach  . Cancer Brother        lung    Social History Social History   Tobacco Use  . Smoking status: Current Every Day Smoker    Packs/day: 0.20    Years: 45.00    Pack years: 9.00    Types: Cigarettes  . Smokeless tobacco: Never Used  . Tobacco comment: 4 cigs daily  Substance Use Topics  . Alcohol use: No  . Drug use: No     Allergies   Codeine; Fentanyl; Hydrocodone-acetaminophen; and Tramadol   Review of Systems Review of Systems  Constitutional: Negative for chills, diaphoresis, fatigue and fever.  HENT: Negative for congestion, rhinorrhea and sneezing.   Eyes: Negative.   Respiratory: Negative for cough, chest tightness and shortness of breath.    Cardiovascular: Positive for chest pain. Negative for leg swelling.       Discomfort to the center of her chest  Gastrointestinal: Negative for abdominal pain, blood in stool, diarrhea, nausea and vomiting.  Genitourinary: Negative for difficulty urinating, flank pain, frequency and hematuria.  Musculoskeletal: Negative for arthralgias and back pain.  Skin: Negative for rash.  Neurological: Negative for dizziness, speech difficulty, weakness, numbness and headaches.     Physical Exam Updated Vital Signs BP 121/63   Pulse 84   Temp 98.7 F (37.1 C)   Resp 18   SpO2 96%   Physical Exam  Constitutional: She is oriented to person, place, and time. She appears well-developed and well-nourished.  HENT:  Head: Normocephalic and atraumatic.  Eyes: Pupils are equal, round, and reactive to light.  Neck: Normal range of motion. Neck supple.  Cardiovascular: Normal rate, regular rhythm and normal heart sounds.  Pulmonary/Chest: Effort normal and breath sounds normal. No respiratory distress. She has no wheezes. She has no rales. She exhibits no tenderness.  Abdominal: Soft. Bowel sounds are normal. There is no tenderness. There is no rebound and no guarding.  Musculoskeletal: Normal range of motion. She exhibits no edema.  Lymphadenopathy:    She has no cervical adenopathy.  Neurological: She is alert and oriented to person, place, and time.  Skin: Skin is warm and dry. No rash noted.  Psychiatric: She has a normal mood and affect.     ED Treatments / Results  Labs (all labs ordered are listed, but only abnormal results are displayed) Labs Reviewed  COMPREHENSIVE METABOLIC PANEL - Abnormal; Notable for the following components:      Result Value   Potassium 3.3 (*)    Total Protein 8.2 (*)    All other components within normal limits  LIPASE, BLOOD  CBC WITH DIFFERENTIAL/PLATELET    EKG EKG Interpretation  Date/Time:  Tuesday October 17 2017 16:37:17 EDT Ventricular Rate:   104 PR Interval:    QRS Duration: 100 QT Interval:  367 QTC Calculation: 483 R Axis:   74 Text Interpretation:  Sinus tachycardia Left atrial enlargement Low voltage, precordial leads Abnormal R-wave progression, early transition Minimal ST elevation, inferior leads Baseline wander in lead(s) II III aVR aVL aVF V1 V2 V3 V4  V5 SINCE LAST TRACING HEART RATE HAS INCREASED Confirmed by Malvin Johns [phone]) on 10/17/2017 5:02:21 PM   Radiology Dg Chest 2 View  Result Date: 10/17/2017 CLINICAL DATA:  Esophageal dysphagia. EXAM: CHEST - 2 VIEW COMPARISON:  Radiographs of May 20, 2016. FINDINGS: The heart size and mediastinal contours are within normal limits. Both lungs are clear. No pneumothorax or pleural effusion is noted. The visualized skeletal structures are unremarkable. IMPRESSION: No active cardiopulmonary disease. Electronically Signed   By: Marijo Conception, M.D.   On: 10/17/2017 17:27   Dg Abdomen 1 View  Result Date: 10/17/2017 CLINICAL DATA:  Patient here from home stating that she has not belched in 3 days and is very concerned. EXAM: ABDOMEN - 1 VIEW COMPARISON:  None. FINDINGS: Scattered large and small bowel gas is noted. No abnormal mass or abnormal calcifications are noted. No soft tissue or bony abnormality is seen. IMPRESSION: No acute abnormality noted. Electronically Signed   By: Inez Catalina M.D.   On: 10/17/2017 17:18    Procedures Procedures (including critical care time)  Medications Ordered in ED Medications  gi cocktail (Maalox,Lidocaine,Donnatal) (30 mLs Oral Refused 10/17/17 1634)  alum & mag hydroxide-simeth (MAALOX/MYLANTA) 200-200-20 MG/5ML suspension 30 mL (30 mLs Oral Given 10/17/17 1747)  acetaminophen (TYLENOL) tablet 1,000 mg (1,000 mg Oral Given 10/17/17 2120)     Initial Impression / Assessment and Plan / ED Course  I have reviewed the triage vital signs and the nursing notes.  Pertinent labs & imaging results that were available during my care  of the patient were reviewed by me and considered in my medical decision making (see chart for details).     Patient is a 56 year old female who presents with a feeling that she needs to burp but she cannot.  She has a history of dysphasia and has had similar problems in the past although they have not lasted this long.  She is able to eat without vomiting.  There is no suggestions of esophageal impaction or obstruction.  Chest x-ray does not show any evidence of mediastinal air or widening of the mediastinum.  There is no evidence of significant gas in the stomach.  No evidence of obstruction.  She laid down for a while and her symptoms completely resolved.  I do not feel this is consistent with ACS.  She has an appointment to follow-up with her gastroenterologist on August 1.  She will call to see if she can get an earlier appointment.  She was encouraged to continue her omeprazole.  Return precautions were given.  Final Clinical Impressions(s) / ED Diagnoses   Final diagnoses:  Other dysphagia    ED Discharge Orders    None       Malvin Johns, MD 10/17/17 2232

## 2017-10-17 NOTE — ED Triage Notes (Signed)
Patient here from home stating that she has not belched in 3 days and is very concerned.

## 2017-10-19 ENCOUNTER — Telehealth: Payer: Self-pay

## 2017-10-19 NOTE — Telephone Encounter (Signed)
Told Valerie Kerr that the Chest X-ray was normal on 10-17-17 per [name], NP. Keep GI appointment as scheduled for 10-26-17. Pt verbalized understanding.

## 2017-10-22 ENCOUNTER — Emergency Department (HOSPITAL_COMMUNITY)
Admission: EM | Admit: 2017-10-22 | Discharge: 2017-10-22 | Disposition: A | Discharge status: Home / Self Care | Payer: Medicaid Other | Attending: Emergency Medicine | Admitting: Emergency Medicine

## 2017-10-22 ENCOUNTER — Other Ambulatory Visit: Payer: Self-pay

## 2017-10-22 ENCOUNTER — Encounter (HOSPITAL_COMMUNITY): Payer: Self-pay

## 2017-10-22 DIAGNOSIS — I10 Essential (primary) hypertension: Secondary | ICD-10-CM | POA: Diagnosis not present

## 2017-10-22 DIAGNOSIS — Z79899 Other long term (current) drug therapy: Secondary | ICD-10-CM | POA: Diagnosis not present

## 2017-10-22 DIAGNOSIS — R1310 Dysphagia, unspecified: Secondary | ICD-10-CM | POA: Insufficient documentation

## 2017-10-22 DIAGNOSIS — R5383 Other fatigue: Secondary | ICD-10-CM | POA: Diagnosis not present

## 2017-10-22 DIAGNOSIS — R5381 Other malaise: Secondary | ICD-10-CM | POA: Insufficient documentation

## 2017-10-22 DIAGNOSIS — F17210 Nicotine dependence, cigarettes, uncomplicated: Secondary | ICD-10-CM | POA: Diagnosis not present

## 2017-10-22 LAB — CBC WITH DIFFERENTIAL/PLATELET
Basophils Absolute: 0 10*3/uL (ref 0.0–0.1)
Basophils Relative: 0 %
Eosinophils Absolute: 0 10*3/uL (ref 0.0–0.7)
Eosinophils Relative: 0 %
HEMATOCRIT: 41.1 % (ref 36.0–46.0)
Hemoglobin: 13.7 g/dL (ref 12.0–15.0)
LYMPHS ABS: 1.2 10*3/uL (ref 0.7–4.0)
LYMPHS PCT: 14 %
MCH: 29.1 pg (ref 26.0–34.0)
MCHC: 33.3 g/dL (ref 30.0–36.0)
MCV: 87.3 fL (ref 78.0–100.0)
MONO ABS: 0.4 10*3/uL (ref 0.1–1.0)
Monocytes Relative: 4 %
NEUTROS ABS: 6.9 10*3/uL (ref 1.7–7.7)
Neutrophils Relative %: 82 %
Platelets: 306 10*3/uL (ref 150–400)
RBC: 4.71 MIL/uL (ref 3.87–5.11)
RDW: 12.7 % (ref 11.5–15.5)
WBC: 8.5 10*3/uL (ref 4.0–10.5)

## 2017-10-22 LAB — BASIC METABOLIC PANEL
Anion gap: 11 (ref 5–15)
BUN: 17 mg/dL (ref 6–20)
CHLORIDE: 93 mmol/L — AB (ref 98–111)
CO2: 37 mmol/L — ABNORMAL HIGH (ref 22–32)
CREATININE: 1.11 mg/dL — AB (ref 0.44–1.00)
Calcium: 9.7 mg/dL (ref 8.9–10.3)
GFR calc non Af Amer: 54 mL/min — ABNORMAL LOW (ref >60)
Glucose, Bld: 111 mg/dL — ABNORMAL HIGH (ref 70–99)
POTASSIUM: 3.5 mmol/L (ref 3.5–5.1)
Sodium: 141 mmol/L (ref 135–145)

## 2017-10-22 MED ORDER — METOCLOPRAMIDE HCL 5 MG/ML IJ SOLN
10.0000 mg | Freq: Once | INTRAMUSCULAR | Status: AC
  Administered 2017-10-22: 10 mg via INTRAVENOUS
  Filled 2017-10-22: qty 2

## 2017-10-22 MED ORDER — SODIUM CHLORIDE 0.9 % IV BOLUS
1000.0000 mL | Freq: Once | INTRAVENOUS | Status: AC
  Administered 2017-10-22: 1000 mL via INTRAVENOUS

## 2017-10-22 NOTE — ED Triage Notes (Signed)
She c/o "reflux and I'm a little anxious too". She arrives ambulatory and in no distress.

## 2017-10-22 NOTE — ED Provider Notes (Signed)
[place] DEPT Provider Note   CSN: [id number] Arrival date & time: 10/22/17  1324     History   Chief Complaint Chief Complaint  Patient presents with  . Gastroesophageal Reflux    HPI Valerie Kerr is a 57 y.o. female.  HPI   She presents for evaluation of difficulty swallowing, ongoing for several months, associated with stress and anxiousness.  She is due to see her GI doctor for a follow-up appointment in several days.  She is also concerned about ongoing anorexia, and feels like she has to force herself to eat.  She denies fever, chills, cough, shortness of breath, chest pain, weakness or dizziness.  There are no other known modifying factors.  Past Medical History:  Diagnosis Date  . Anxiety   . 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 Chemotherapy-induced thrombocytopenia   . Chronic pain disorder    back,neck-- chronic methadone  . Environmental allergies    allergy to dust mites  . GERD (gastroesophageal reflux disease)   . Headache   . Herniated disc, cervical   . History of external beam radiation therapy 04/12/16-05/31/16   pelvis 45 Gy in 25 fractions, in  30 sessions, pelvis boost 9 Gy in 5 fractions  . History of radiation therapy 06/20/16-07/04/16   tandem and ring applicator to cervix 28 Gy in 5 fractions  . Hypertension   . Hypokalemia   . Hypomagnesemia    po supplement and IV replacement  . Leukopenia due to antineoplastic chemotherapy ([place])   . Lumbar herniated disc   . Pancytopenia due to chemotherapy ([place])   . Periodontitis     Patient Active Problem List   Diagnosis Date Noted  . Spinal stenosis 01/18/2017  . Insomnia 01/18/2017  . Lumbar  herniated disc 09/05/2016  . Cervical radiculopathy 09/05/2016  . Neuropathy 09/05/2016  . Depression 08/15/2016  . Inguinal lymphadenopathy 06/30/2016  . Thrombocytopenia ([place]) 05/24/2016  . Dehydration 05/24/2016  . Anxiety 05/24/2016  . Nausea and vomiting 05/17/2016  . Leukopenia due to antineoplastic chemotherapy ([place]) 05/12/2016  . Hypomagnesemia 05/12/2016  . Hypokalemia 05/12/2016  . Anemia due to antineoplastic chemotherapy 05/04/2016  . Continuous tobacco abuse 04/01/2016  . Poor dentition 04/01/2016  . Essential hypertension 04/01/2016  . Methadone use ([place]) 04/01/2016  . Allergic sinusitis 04/01/2016  . Gastroesophageal reflux disease 04/01/2016  . Cervical cancer ([place]) 03/03/2016    Past Surgical History:  Procedure Laterality Date  . ABCESS DRAINAGE Left 1982   breast  . CARPAL TUNNEL RELEASE Bilateral   . CERVICAL DISC SURGERY  2010  . MULTIPLE EXTRACTIONS WITH ALVEOLOPLASTY N/A 04/08/2016   Procedure: Extraction of tooth #'s [phone] with alveoloplasty  and gross debridement of remaining teeth;  Surgeon: Lenn Cal, DDS;  Location: [place];  Service: Oral Surgery;  Laterality: N/A;  . TANDEM RING INSERTION N/A 05/26/2016   Procedure: TANDEM RING INSERTION;  Surgeon: [name], MD;  Location[facility];  Service: Urology;  Laterality: N/A;  . TANDEM RING INSERTION N/A 06/02/2016   Procedure: TANDEM RING INSERTION;  Surgeon: [name], MD;  Location: [facility];  Service: Urology;  Laterality: N/A;  . TANDEM RING INSERTION N/A 06/20/2016   Procedure: TANDEM RING INSERTION;  Surgeon: Jeneen Rinks  Sondra Come, MD;  Location: [facility];  Service: Urology;  Laterality: N/A;  . TANDEM RING INSERTION N/A 06/23/2016   Procedure: TANDEM RING INSERTION;  Surgeon: [name], MD;  Location: [facility]  Service: Urology;  Laterality: N/A;  . TANDEM RING INSERTION N/A 07/04/2016   Procedure: TANDEM RING INSERTION;   Surgeon: [name], MD;  Location: [facility];  Service: Urology;  Laterality: N/A;     OB History   None      Home Medications    Prior to Admission medications   Medication Sig Start Date End Date Taking? Authorizing Provider  ALPRAZolam (XANAX) 0.25 MG tablet Take 1 tablet (0.25 mg total) by mouth 2 (two) times daily as needed for anxiety. Patient taking differently: Take 0.125 mg by mouth 2 (two) times daily as needed for anxiety.  10/12/17  Yes [name], MD  cetirizine (ZYRTEC) 10 MG tablet Take 1 tablet (10 mg total) by mouth daily. 05/19/17  Yes Newlin, Charlane Ferretti, MD  fluticasone (FLONASE) 50 MCG/ACT nasal spray Place 1 spray into both nostrils daily. 05/19/17  Yes Newlin, Charlane Ferretti, MD  gabapentin (NEURONTIN) 300 MG capsule TAKE TWO CAPSULES BY MOUTH TWICE DAILY 09/26/17  Yes Newlin, Enobong, MD  HYDROmorphone (DILAUDID) 4 MG tablet Take 1 tablet (4 mg total) by mouth every 6 (six) hours as needed for severe pain. 10/12/17  Yes [name], MD  lidocaine (LIDODERM) 5 % Place 1 patch onto the skin daily. Remove & Discard patch within 12 hours or as directed by MD 08/18/17  Yes Charlott Rakes, MD  lisinopril-hydrochlorothiazide (PRINZIDE,ZESTORETIC) 20-25 MG tablet TAKE ONE TABLET BY MOUTH EVERY DAY 08/30/17  Yes Charlott Rakes, MD  methadone (DOLOPHINE) 10 MG/5ML solution Take 60 mg by mouth daily.    Yes [provider]  omeprazole (PRILOSEC) 20 MG capsule Take 1 capsule (20 mg total) by mouth daily. 05/19/17  Yes Charlott Rakes, MD  prochlorperazine (COMPAZINE) 10 MG tablet Take 1 tablet (10 mg total) by mouth every 6 (six) hours as needed for nausea. 10/12/17  Yes [name], MD  mirtazapine (REMERON) 15 MG tablet Take 1 tablet (15 mg total) by mouth at bedtime. Patient not taking: Reported on 10/22/2017 01/18/17   Charlott Rakes, MD  VOLTAREN 1 % GEL Apply 4 g topically 4 (four) times daily. Patient not taking: Reported on 10/22/2017 03/31/17   Charlott Rakes, MD    Family History Family History  Problem Relation Age of Onset  . Cancer Father   . Cancer Sister        throat  . Cancer Brother        throat and stomach  . Cancer Brother        lung    Social History Social History   Tobacco Use  . Smoking status: Current Every Day Smoker    Packs/day: 0.20    Years: 45.00    Pack years: 9.00    Types: Cigarettes  . Smokeless tobacco: Never Used  . Tobacco comment: 4 cigs daily  Substance Use Topics  . Alcohol use: No  . Drug use: No     Allergies   Codeine; Fentanyl; Hydrocodone-acetaminophen; and Tramadol   Review of Systems Review of Systems  All other systems reviewed and are negative.    Physical Exam Updated Vital Signs BP 124/81   Pulse 84   Temp 99.1 F (37.3 C) (Oral)   Resp 18   SpO2 99%   Physical Exam  Constitutional: She is oriented to person, place, and time. She appears well-developed and well-nourished.  HENT:  Head: Normocephalic and atraumatic.  Right Ear: External ear normal.  Left Ear: External ear normal.  Eyes: Pupils are equal, round, and reactive to light. Conjunctivae and EOM are normal.  Neck: Normal range of motion and phonation normal. Neck supple.  Cardiovascular: Normal rate, regular rhythm and normal heart sounds.  Pulmonary/Chest: Effort normal and breath sounds normal. No respiratory distress. She exhibits no bony tenderness.  Abdominal: Soft. There is no tenderness.  Musculoskeletal: Normal range of motion.  Neurological: She is alert and oriented to person, place, and time. No cranial nerve deficit or sensory deficit. She exhibits normal muscle tone. Coordination normal.  Skin: Skin is warm, dry and intact.  Psychiatric: She has a normal mood and affect. Her behavior is normal. Judgment and thought content normal.  Nursing note and vitals reviewed.    ED Treatments / Results  Labs (all labs ordered are listed, but only abnormal results are displayed) Labs  Reviewed  BASIC METABOLIC PANEL - Abnormal; Notable for the following components:      Result Value   Chloride 93 (*)    CO2 37 (*)    Glucose, Bld 111 (*)    Creatinine, Ser 1.11 (*)    GFR calc non Af Amer 54 (*)    All other components within normal limits  CBC WITH DIFFERENTIAL/PLATELET    EKG None  Radiology No results found.  Procedures Procedures (including critical care time)  Medications Ordered in ED Medications  sodium chloride 0.9 % bolus 1,000 mL (0 mLs Intravenous Stopped 10/22/17 1338)  metoCLOPramide (REGLAN) injection 10 mg (10 mg Intravenous Given 10/22/17 1155)     Initial Impression / Assessment and Plan / ED Course  I have reviewed the triage vital signs and the nursing notes.  Pertinent labs & imaging results that were available during my care of the patient were reviewed by me and considered in my medical decision making (see chart for details).  Clinical Course as of Oct 23 1427  Sun Oct 22, 2017  1305 Normal except chloride low, CO2 high, glucose high, creatinine, GFR low  Basic metabolic panel(!) [EW]  1884 Normal  CBC with Differential [EW]    Clinical Course User Index [EW] Daleen Bo, MD     Patient Vitals for the past 24 hrs:  BP Temp Temp src Pulse Resp SpO2  10/22/17 1330 124/81 - - 84 18 99 %  10/22/17 1130 117/80 - - 87 16 90 %  10/22/17 1020 (!) 126/97 - - 93 16 95 %  10/22/17 0944 122/86 99.1 F (37.3 C) Oral 83 18 95 %    2:26 PM Reevaluation with update and discussion. After initial assessment and treatment, an updated evaluation reveals she is tolerating crackers and liquids at this time.  She tells me she is stressed because she has to take care of 6 children who were not her own, and she feels "too stressed."  Findings discussed and questions answered. Daleen Bo   Medical Decision Making: Nonspecific swallowing disorder with psychosocial stressors.  Suspect component of anxiety associated with swallowing trouble.   She has upcoming follow-up planned with gastroenterology for further evaluation of the swallowing difficulty.  Doubt serious bacterial infection, metabolic instability or impending vascular collapse.  CRITICAL CARE-no Performed by: Daleen Bo   Nursing Notes Reviewed/ Care Coordinated Applicable Imaging Reviewed Interpretation of Laboratory Data incorporated into ED treatment  The patient appears  reasonably screened and/or stabilized for discharge and I doubt any other medical condition or other [facility] requiring further screening, evaluation, or treatment in the ED at this time prior to discharge.  Plan: Home Medications-continue usual medications; Home Treatments-use nutritional shakes to help build up nutrition; return here if the recommended treatment, does not improve the symptoms; Recommended follow up-PCP, as needed.  GI as scheduled.     Final Clinical Impressions(s) / ED Diagnoses   Final diagnoses:  Malaise and fatigue  Dysphagia, unspecified type    ED Discharge Orders    None       Daleen Bo, MD 10/22/17 1429

## 2017-10-22 NOTE — ED Notes (Signed)
Provided patient with ginger ale, graham crackers and peanut butter.

## 2017-10-22 NOTE — Discharge Instructions (Addendum)
To help your swallowing problem try using a protein shake like Ensure or boost.  Make sure you are drinking plenty of water as well.  It is important to avoid stress, so that anxiety does not complicate your problem.  Make sure you are taking plenty of time to take care of yourself, and avoid things such as childcare which tend to cause you stress.  Return here, if needed, for problems.

## 2017-10-24 ENCOUNTER — Ambulatory Visit: Payer: Self-pay | Admitting: Family Medicine

## 2017-10-25 ENCOUNTER — Ambulatory Visit: Payer: Medicaid Other | Attending: Family Medicine | Admitting: Physician Assistant

## 2017-10-25 VITALS — BP 132/85 | HR 80 | Temp 98.2°F | Resp 18 | Ht 62.0 in | Wt 156.0 lb

## 2017-10-25 DIAGNOSIS — Z885 Allergy status to narcotic agent status: Secondary | ICD-10-CM | POA: Insufficient documentation

## 2017-10-25 DIAGNOSIS — Z8541 Personal history of malignant neoplasm of cervix uteri: Secondary | ICD-10-CM | POA: Diagnosis not present

## 2017-10-25 DIAGNOSIS — G8929 Other chronic pain: Secondary | ICD-10-CM | POA: Diagnosis not present

## 2017-10-25 DIAGNOSIS — Z9221 Personal history of antineoplastic chemotherapy: Secondary | ICD-10-CM | POA: Insufficient documentation

## 2017-10-25 DIAGNOSIS — Z09 Encounter for follow-up examination after completed treatment for conditions other than malignant neoplasm: Secondary | ICD-10-CM

## 2017-10-25 DIAGNOSIS — Z79899 Other long term (current) drug therapy: Secondary | ICD-10-CM | POA: Diagnosis not present

## 2017-10-25 DIAGNOSIS — K219 Gastro-esophageal reflux disease without esophagitis: Secondary | ICD-10-CM | POA: Insufficient documentation

## 2017-10-25 DIAGNOSIS — F419 Anxiety disorder, unspecified: Secondary | ICD-10-CM

## 2017-10-25 DIAGNOSIS — Z888 Allergy status to other drugs, medicaments and biological substances status: Secondary | ICD-10-CM | POA: Diagnosis not present

## 2017-10-25 DIAGNOSIS — I10 Essential (primary) hypertension: Secondary | ICD-10-CM

## 2017-10-25 DIAGNOSIS — Z923 Personal history of irradiation: Secondary | ICD-10-CM | POA: Diagnosis not present

## 2017-10-25 MED ORDER — BUSPIRONE HCL 15 MG PO TABS: 15.0000 mg | ORAL_TABLET | Freq: Two times a day (BID) | ORAL | 3 refills | Status: DC

## 2017-10-25 NOTE — Patient Instructions (Signed)

## 2017-10-25 NOTE — Progress Notes (Signed)
Patient ID: Valerie Kerr, female   DOB: 1961-01-24, 57 y.o.   MRN: [id number]      Valerie Kerr, is a 57 y.o. female  TIR:[id number]  QPY:[id number]  DOB - 28-Jun-1960  Subjective:  Chief Complaint and HPI: Valerie Kerr is a 57 y.o. female here today for a follow up visit after being seen in the ED 10/22/2017 for issues with burping and swallowing.  Sees GI tomorrow for this.   Having a lot of problems with anxiety.  Taking xanax occasionally for this.  Has never been on anti-anxiety meds.  Has obsessive thinking and worry.  Takes care of about 6 of her grandchildren.  No CP/SOB.  Declines meeting with the social worker for counseling today.  Denies SI/HI.     Seen in ED 10/22/2017: She presents for evaluation of difficulty swallowing, ongoing for several months, associated with stress and anxiousness.  She is due to see her GI doctor for a follow-up appointment in several days.  She is also concerned about ongoing anorexia, and feels like she has to force herself to eat.  She denies fever, chills, cough, shortness of breath, chest pain, weakness or dizziness.  There are no other known modifying factors.  from A/P: 2:26 PM Reevaluation with update and discussion. After initial assessment and treatment, an updated evaluation reveals she is tolerating crackers and liquids at this time.  She tells me she is stressed because she has to take care of 6 children who were not her own, and she feels "too stressed."  Findings discussed and questions answered. Daleen Bo   Medical Decision Making: Nonspecific swallowing disorder with psychosocial stressors.  Suspect component of anxiety associated with swallowing trouble.  She has upcoming follow-up planned with gastroenterology for further evaluation of the swallowing difficulty.  Doubt serious bacterial infection, metabolic instability or impending vascular collapse.  CRITICAL CARE-no Performed by: Daleen Bo   Nursing Notes  Reviewed/ Care Coordinated Applicable Imaging Reviewed Interpretation of Laboratory Data incorporated into ED treatment  The patient appears reasonably screened and/or stabilized for discharge and I doubt any other medical condition or other [facility] requiring further screening, evaluation, or treatment in the ED at this time prior to discharge.  Plan: Home Medications-continue usual medications; Home Treatments-use nutritional shakes to help build up nutrition; return here if the recommended treatment, does not improve the symptoms; Recommended follow up-PCP, as needed.  GI as scheduled.  ED/Hospital notes reviewed.    ROS:   Constitutional:  No f/c, No night sweats, No unexplained weight loss. EENT:  No vision changes, No blurry vision, No hearing changes. No mouth, throat, or ear problems.  Respiratory: No cough, No SOB Cardiac: No CP, no palpitations GI:  No abd pain, No N/V/D. GU: No Urinary s/sx Musculoskeletal: No joint pain Neuro: No headache, no dizziness, no motor weakness.  Skin: No rash Endocrine:  No polydipsia. No polyuria.  Psych: Denies SI/HI, + anxiety, no depression  No problems updated.  ALLERGIES: Allergies  Allergen Reactions  . Codeine Itching and Rash  . Fentanyl Rash    Skin rash, local swelling from patch  . Hydrocodone-Acetaminophen Rash  . Tramadol Other (See Comments)    GI upset,also trembling sensation    PAST MEDICAL HISTORY: Past Medical History:  Diagnosis Date  . Anxiety   . 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 Chemotherapy-induced thrombocytopenia   . Chronic pain disorder    back,neck-- chronic methadone  . Environmental allergies    allergy to dust mites  . GERD  (gastroesophageal reflux disease)   . Headache   . Herniated disc, cervical   . History of external beam radiation therapy 04/12/16-05/31/16   pelvis 45 Gy in 25 fractions, in  30 sessions, pelvis boost 9 Gy in 5 fractions  . History of radiation therapy 06/20/16-07/04/16   tandem and ring applicator to cervix 28 Gy in 5 fractions  . Hypertension   . Hypokalemia   . Hypomagnesemia    po supplement and IV replacement  . Leukopenia due to antineoplastic chemotherapy ([place])   . Lumbar herniated disc   . Pancytopenia due to chemotherapy ([place])   . Periodontitis     MEDICATIONS AT HOME: Prior to Admission medications   Medication Sig Start Date End Date Taking? Authorizing Provider  ALPRAZolam (XANAX) 0.25 MG tablet Take 1 tablet (0.25 mg total) by mouth 2 (two) times daily as needed for anxiety. Patient taking differently: Take 0.125 mg by mouth 2 (two) times daily as needed for anxiety.  10/12/17  Yes [name], MD  cetirizine (ZYRTEC) 10 MG tablet Take 1 tablet (10 mg total) by mouth daily. 05/19/17  Yes Newlin, Charlane Ferretti, MD  fluticasone (FLONASE) 50 MCG/ACT nasal spray Place 1 spray into both nostrils daily. 05/19/17  Yes Newlin, Charlane Ferretti, MD  gabapentin (NEURONTIN) 300 MG capsule TAKE TWO CAPSULES BY MOUTH TWICE DAILY 09/26/17  Yes Newlin, Enobong, MD  HYDROmorphone (DILAUDID) 4 MG tablet Take 1 tablet (4 mg total) by mouth every 6 (six) hours as needed for severe pain. 10/12/17  Yes [name], MD  lidocaine (LIDODERM) 5 % Place 1 patch onto the skin daily. Remove & Discard patch within 12 hours or as directed by MD 08/18/17  Yes Charlott Rakes, MD  lisinopril-hydrochlorothiazide (PRINZIDE,ZESTORETIC) 20-25 MG tablet TAKE ONE TABLET BY MOUTH EVERY DAY 08/30/17  Yes Charlott Rakes, MD  methadone (DOLOPHINE) 10 MG/5ML solution Take 60 mg by mouth daily.    Yes [provider]  omeprazole (PRILOSEC) 20 MG capsule Take 1 capsule (20 mg total) by mouth daily. 05/19/17  Yes Charlott Rakes, MD  prochlorperazine (COMPAZINE) 10 MG tablet Take 1 tablet (10 mg total) by mouth every 6 (six) hours as needed for nausea. 10/12/17  Yes [name], MD  busPIRone (BUSPAR) 15 MG tablet Take 1 tablet (15 mg total) by mouth 2 (two) times daily. 10/25/17   Argentina Donovan, PA-C  mirtazapine (REMERON) 15 MG tablet Take 1 tablet (15 mg total) by mouth at bedtime. Patient not taking: Reported on 10/22/2017 01/18/17   Charlott Rakes, MD  VOLTAREN 1 % GEL Apply 4 g topically 4 (four) times daily. Patient not taking: Reported on 10/22/2017 03/31/17   Charlott Rakes, MD     Objective:  EXAM:   Vitals:   10/25/17 1113  BP: 132/85  Pulse: 80  Resp: 18  Temp: 98.2 F (36.8 C)  TempSrc: Oral  SpO2: 95%  Weight: 156 lb (70.8 kg)  Height: 5\' 2"  (1.575 m)    General appearance : A&OX3. NAD. Non-toxic-appearing HEENT: Atraumatic and Normocephalic.  PERRLA. EOM intact.  Neck: supple, no JVD. No cervical lymphadenopathy. No thyromegaly Chest/Lungs:  Breathing-non-labored, Good air entry bilaterally, breath sounds normal without rales, rhonchi, or wheezing  CVS: S1 S2 regular, no murmurs, gallops, rubs  Abdomen: Bowel sounds present, Non tender and not distended with no gaurding, rigidity or rebound. Extremities: Bilateral Lower Ext shows no edema, both legs are warm to touch with = pulse throughout Neurology:  CN II-XII grossly intact, Non focal.   Psych:  TP linear. J/I fair. Normal speech. Appropriate eye contact and affect. Anxious mood.   Skin:  No Rash  Data Review No results found for: HGBA1C   Assessment & Plan   1. Anxiety Xanax not a good choice since this seems to be ongoing for now.  Modify/reduce stress.  Self-care stressed.  She refused meeting with social worker today.   - TSH - Vitamin D, 25-hydroxy - busPIRone (BUSPAR) 15 MG tablet; Take 1 tablet (15 mg total) by mouth 2 (two) times daily.  Dispense: 60 tablet; Refill: 3  2. Essential hypertension =controlled.   Continue current regimen  Patient have been counseled extensively about nutrition and exercise  Return in about 1 month (around 11/25/2017) for Dr Newlin-recheck anxiety.  The patient was given clear instructions to go to ER or return to medical center if symptoms don't improve, worsen or new problems develop. The patient verbalized understanding. The patient was told to call to get lab results if they haven't heard anything in the next week.     Freeman Caldron, PA-C [facility] [place], [place]   10/25/2017, 11:27 AM

## 2017-10-26 ENCOUNTER — Ambulatory Visit: Payer: Self-pay | Admitting: Physician Assistant

## 2017-10-26 ENCOUNTER — Other Ambulatory Visit: Payer: Self-pay | Admitting: Physician Assistant

## 2017-10-26 DIAGNOSIS — E559 Vitamin D deficiency, unspecified: Secondary | ICD-10-CM

## 2017-10-26 LAB — TSH: TSH: 0.795 u[IU]/mL (ref 0.450–4.500)

## 2017-10-26 LAB — VITAMIN D 25 HYDROXY (VIT D DEFICIENCY, FRACTURES): Vit D, 25-Hydroxy: 6.2 ng/mL — ABNORMAL LOW (ref 30.0–100.0)

## 2017-10-26 MED ORDER — VITAMIN D (ERGOCALCIFEROL) 1.25 MG (50000 UNIT) PO CAPS: 50000.0000 [IU] | ORAL_CAPSULE | ORAL | 0 refills | Status: DC

## 2017-10-27 ENCOUNTER — Telehealth: Payer: Self-pay | Admitting: *Deleted

## 2017-10-27 NOTE — Telephone Encounter (Signed)
Medical Assistant left message on patient's home and cell voicemail. Voicemail states to give a call back to [place] with [facility] at [phone]. Patient is aware of thyroid and labs being normal except for needing to address the extremely low vitamin d level. Patient advised to pick up or have alder deliver the weekly capsules and complete a recheck in 3-4 months.

## 2017-10-27 NOTE — Telephone Encounter (Signed)
-----   Message from Argentina Donovan, [place] sent at 10/26/2017  8:30 AM EDT ----- Please call patient.  Thyroid is normal. Her vitamin D is extremely low.  This can contribute to muscle aches, anxiety, fatigue, and depression.  I have sent a prescription to the pharmacy for her to take once a week.  We will recheck this level in 3-4 months.  Thanks, Freeman Caldron, PA-C

## 2017-11-24 ENCOUNTER — Ambulatory Visit: Payer: Self-pay | Admitting: Physician Assistant

## 2017-11-25 ENCOUNTER — Emergency Department (HOSPITAL_COMMUNITY)
Admission: EM | Admit: 2017-11-25 | Discharge: 2017-11-25 | Disposition: A | Discharge status: Home / Self Care | Payer: Medicaid Other | Attending: Emergency Medicine | Admitting: Emergency Medicine

## 2017-11-25 ENCOUNTER — Encounter (HOSPITAL_COMMUNITY): Payer: Self-pay

## 2017-11-25 ENCOUNTER — Other Ambulatory Visit: Payer: Self-pay

## 2017-11-25 ENCOUNTER — Emergency Department (HOSPITAL_COMMUNITY): Payer: Medicaid Other

## 2017-11-25 DIAGNOSIS — Z79899 Other long term (current) drug therapy: Secondary | ICD-10-CM | POA: Diagnosis not present

## 2017-11-25 DIAGNOSIS — F17210 Nicotine dependence, cigarettes, uncomplicated: Secondary | ICD-10-CM | POA: Insufficient documentation

## 2017-11-25 DIAGNOSIS — R0602 Shortness of breath: Secondary | ICD-10-CM | POA: Diagnosis present

## 2017-11-25 DIAGNOSIS — Z8541 Personal history of malignant neoplasm of cervix uteri: Secondary | ICD-10-CM | POA: Diagnosis not present

## 2017-11-25 DIAGNOSIS — I10 Essential (primary) hypertension: Secondary | ICD-10-CM | POA: Diagnosis not present

## 2017-11-25 DIAGNOSIS — F419 Anxiety disorder, unspecified: Secondary | ICD-10-CM | POA: Insufficient documentation

## 2017-11-25 LAB — CBG MONITORING, ED: Glucose-Capillary: 79 mg/dL (ref 70–99)

## 2017-11-25 LAB — CBC
HEMATOCRIT: 41 % (ref 36.0–46.0)
Hemoglobin: 13.4 g/dL (ref 12.0–15.0)
MCH: 29.1 pg (ref 26.0–34.0)
MCHC: 32.7 g/dL (ref 30.0–36.0)
MCV: 89.1 fL (ref 78.0–100.0)
PLATELETS: 311 10*3/uL (ref 150–400)
RBC: 4.6 MIL/uL (ref 3.87–5.11)
RDW: 13.8 % (ref 11.5–15.5)
WBC: 6.9 10*3/uL (ref 4.0–10.5)

## 2017-11-25 LAB — BASIC METABOLIC PANEL
Anion gap: 13 (ref 5–15)
BUN: 14 mg/dL (ref 6–20)
CHLORIDE: 96 mmol/L — AB (ref 98–111)
CO2: 32 mmol/L (ref 22–32)
CREATININE: 1.04 mg/dL — AB (ref 0.44–1.00)
Calcium: 9.8 mg/dL (ref 8.9–10.3)
GFR calc Af Amer: >60 mL/min (ref >60)
GFR, EST NON AFRICAN AMERICAN: 58 mL/min — AB (ref >60)
GLUCOSE: 91 mg/dL (ref 70–99)
Potassium: 3.2 mmol/L — ABNORMAL LOW (ref 3.5–5.1)
SODIUM: 141 mmol/L (ref 135–145)

## 2017-11-25 LAB — I-STAT TROPONIN, ED: Troponin i, poc: 0 ng/mL (ref 0.00–0.08)

## 2017-11-25 MED ORDER — CLONAZEPAM 0.5 MG PO TABS
0.5000 mg | ORAL_TABLET | Freq: Once | ORAL | Status: AC
  Administered 2017-11-25: 0.5 mg via ORAL
  Filled 2017-11-25: qty 1

## 2017-11-25 MED ORDER — HYDROXYZINE PAMOATE 50 MG PO CAPS
50.0000 mg | ORAL_CAPSULE | Freq: Three times a day (TID) | ORAL | 0 refills | Status: AC | PRN
Start: 2017-11-25 — End: 2017-12-05

## 2017-11-25 NOTE — ED Triage Notes (Signed)
Pt BIBA for SHOB and dizziness. Pt has been out of xanax for 1 week, and has a hx of anxiety.

## 2017-11-25 NOTE — Discharge Instructions (Signed)
Your lab work, EKG and evaluation today was normal. I believe that today's episode was another anxiety attack.  Please see your PCP about Xanax refills. I have prescribed you Vistaril which also works for anxiety, but is safer and with less side effects and risk of dependence. This medication works best if taken scheduled (three times a day) to prevent anxiety attacks from happening. I would also recommend that you talk to your PCP about alternate medications for anxiety (such as Zoloft and Lexapro) which work better over the long-term. As we discussed, the best treatment of anxiety is medication with counseling/therapy.  Thank you for allowing me to take care of you today!

## 2017-11-25 NOTE — ED Provider Notes (Signed)
[facility] DEPT Provider Note  CSN: [id number] Arrival date & time: 11/25/17  1737  History   Chief Complaint Chief Complaint  Patient presents with  . Shortness of Breath  . Dizziness   HPI Valerie Kerr is a 57 y.o. female with a medical history of HTN, GERD, cervical cancer and chronic pain who presented to the ED for SOB x1 day. Patient reports an acute onset episode of SOB, blurred vision, dizziness and lightheadedness. She reports this episode feels similar to past anxiety attacks she has had, but felt that it was more severe because it lasted longer than normal. Unable to state how long the episode lasted, but states that it resolved before EMS arrived to her home. She states that she tried taking Buspar prior to arrival because she was out of her home Rx of Xanax. Denies chest pain, diaphoresis, abdominal pain, N/V, headache, slurred speech, facial droop, paresthesias or weakness.  Past Medical History:  Diagnosis Date  . Anxiety   . 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 Chemotherapy-induced thrombocytopenia   . Chronic pain disorder    back,neck-- chronic methadone  . Environmental allergies    allergy to dust mites  . GERD (gastroesophageal reflux disease)   . Headache   . Herniated disc, cervical   . History of external beam radiation therapy 04/12/16-05/31/16   pelvis 45 Gy in 25 fractions, in  30 sessions, pelvis boost 9 Gy in 5 fractions  . History of radiation therapy 06/20/16-07/04/16   tandem and ring applicator to cervix 28 Gy in 5 fractions  . Hypertension   . Hypokalemia   . Hypomagnesemia    po supplement and IV replacement  . Leukopenia due to antineoplastic  chemotherapy ([place])   . Lumbar herniated disc   . Pancytopenia due to chemotherapy ([place])   . Periodontitis     Patient Active Problem List   Diagnosis Date Noted  . Spinal stenosis 01/18/2017  . Insomnia 01/18/2017  . Lumbar herniated disc 09/05/2016  . Cervical radiculopathy 09/05/2016  . Neuropathy 09/05/2016  . Depression 08/15/2016  . Inguinal lymphadenopathy 06/30/2016  . Thrombocytopenia ([place]) 05/24/2016  . Dehydration 05/24/2016  . Anxiety 05/24/2016  . Nausea and vomiting 05/17/2016  . Leukopenia due to antineoplastic chemotherapy ([place]) 05/12/2016  . Hypomagnesemia 05/12/2016  . Hypokalemia 05/12/2016  . Anemia due to antineoplastic chemotherapy 05/04/2016  . Continuous tobacco abuse 04/01/2016  . Poor dentition 04/01/2016  . Essential hypertension 04/01/2016  . Methadone use ([place]) 04/01/2016  . Allergic sinusitis 04/01/2016  . Gastroesophageal reflux disease 04/01/2016  . Cervical cancer ([place]) 03/03/2016    Past Surgical History:  Procedure Laterality Date  . ABCESS DRAINAGE Left 1982   breast  . CARPAL TUNNEL RELEASE Bilateral   . CERVICAL DISC SURGERY  2010  . MULTIPLE EXTRACTIONS WITH ALVEOLOPLASTY N/A 04/08/2016   Procedure: Extraction of tooth #'s [phone] with alveoloplasty  and gross debridement of remaining teeth;  Surgeon: Lenn Cal, DDS;  Location: [facility];  Service: Oral Surgery;  Laterality: N/A;  . TANDEM RING INSERTION N/A 05/26/2016   Procedure: TANDEM RING INSERTION;  Surgeon: [name], MD;  Location[facility];  Service: Urology;  Laterality: N/A;  . TANDEM RING INSERTION N/A 06/02/2016  Procedure: TANDEM RING INSERTION;  Surgeon: [name], MD;  Location: [facility];  Service: Urology;  Laterality: N/A;  . TANDEM RING INSERTION N/A 06/20/2016   Procedure: TANDEM RING INSERTION;  Surgeon: [name], MD;  Location: [facility];  Service: Urology;  Laterality: N/A;  . TANDEM RING  INSERTION N/A 06/23/2016   Procedure: TANDEM RING INSERTION;  Surgeon: [name], MD;  Location: [facility];  Service: Urology;  Laterality: N/A;  . TANDEM RING INSERTION N/A 07/04/2016   Procedure: TANDEM RING INSERTION;  Surgeon: [name], MD;  Location: [facility];  Service: Urology;  Laterality: N/A;     OB History   None      Home Medications    Prior to Admission medications   Medication Sig Start Date End Date Taking? Authorizing Provider  ALPRAZolam (XANAX) 0.25 MG tablet Take 1 tablet (0.25 mg total) by mouth 2 (two) times daily as needed for anxiety. Patient taking differently: Take 0.125 mg by mouth 2 (two) times daily as needed for anxiety.  10/12/17  Yes [name], MD  busPIRone (BUSPAR) 15 MG tablet Take 1 tablet (15 mg total) by mouth 2 (two) times daily. 10/25/17  Yes Argentina Donovan, PA-C  cetirizine (ZYRTEC) 10 MG tablet Take 1 tablet (10 mg total) by mouth daily. 05/19/17  Yes Newlin, Charlane Ferretti, MD  fluticasone (FLONASE) 50 MCG/ACT nasal spray Place 1 spray into both nostrils daily. 05/19/17  Yes Newlin, Charlane Ferretti, MD  gabapentin (NEURONTIN) 300 MG capsule TAKE TWO CAPSULES BY MOUTH TWICE DAILY Patient taking differently: Take 600 mg by mouth 2 (two) times daily.  09/26/17  Yes Charlott Rakes, MD  HYDROmorphone (DILAUDID) 4 MG tablet Take 1 tablet (4 mg total) by mouth every 6 (six) hours as needed for severe pain. 10/12/17  Yes [name], MD  lisinopril-hydrochlorothiazide (PRINZIDE,ZESTORETIC) 20-25 MG tablet TAKE ONE TABLET BY MOUTH EVERY DAY Patient taking differently: Take 1 tablet by mouth daily.  08/30/17  Yes Charlott Rakes, MD  methadone (DOLOPHINE) 10 MG/5ML solution Take 50 mg by mouth daily.    Yes [provider]  omeprazole (PRILOSEC) 20 MG capsule Take 1 capsule (20 mg total) by mouth daily. 05/19/17  Yes Charlott Rakes, MD  prochlorperazine (COMPAZINE) 10 MG tablet Take 1 tablet (10 mg total) by mouth every 6  (six) hours as needed for nausea. 10/12/17  Yes [name], MD  Vitamin D, Ergocalciferol, (DRISDOL) 50000 units CAPS capsule Take 1 capsule (50,000 Units total) by mouth every 7 (seven) days. Patient taking differently: Take 50,000 Units by mouth every Saturday.  10/26/17  Yes McClung, Dionne Bucy, PA-C  hydrOXYzine (VISTARIL) 50 MG capsule Take 1 capsule (50 mg total) by mouth 3 (three) times daily as needed for up to 10 days. 11/25/17 12/05/17  Burnice Oestreicher, Alvie Heidelberg I, PA-C  lidocaine (LIDODERM) 5 % Place 1 patch onto the skin daily. Remove & Discard patch within 12 hours or as directed by MD Patient not taking: Reported on 11/25/2017 08/18/17   Charlott Rakes, MD  mirtazapine (REMERON) 15 MG tablet Take 1 tablet (15 mg total) by mouth at bedtime. Patient not taking: Reported on 10/22/2017 01/18/17   Charlott Rakes, MD  VOLTAREN 1 % GEL Apply 4 g topically 4 (four) times daily. Patient not taking: Reported on 10/22/2017 03/31/17   Charlott Rakes, MD   Family History Family History  Problem Relation Age of Onset  . Cancer Father   . Cancer Sister  throat  . Cancer Brother        throat and stomach  . Cancer Brother        lung    Social History Social History   Tobacco Use  . Smoking status: Current Every Day Smoker    Packs/day: 0.20    Years: 45.00    Pack years: 9.00    Types: Cigarettes  . Smokeless tobacco: Never Used  . Tobacco comment: 1 week since last cigarette  Substance Use Topics  . Alcohol use: No  . Drug use: No     Allergies   Codeine; Fentanyl; Hydrocodone-acetaminophen; and Tramadol   Review of Systems Review of Systems  Constitutional: Negative for chills, diaphoresis and fever.  HENT: Negative.   Eyes: Positive for visual disturbance.  Respiratory: Positive for shortness of breath. Negative for cough and chest tightness.   Cardiovascular: Negative for chest pain, palpitations and leg swelling.  Gastrointestinal: Negative.   Genitourinary: Negative.     Musculoskeletal:       Chronic pain  Skin: Negative.   Neurological: Positive for dizziness and light-headedness. Negative for tremors, syncope, facial asymmetry, speech difficulty, weakness and headaches.  Psychiatric/Behavioral: The patient is nervous/anxious.      Physical Exam Updated Vital Signs BP 113/71   Pulse 76   Temp 97.8 F (36.6 C) (Oral)   Resp 14   Ht 5' 1.5" (1.562 m)   Wt 71.7 kg   SpO2 95%   BMI 29.37 kg/m   Physical Exam  Constitutional: Vital signs are normal. She appears well-developed and well-nourished. She is cooperative.  Eyes: Pupils are equal, round, and reactive to light. EOM are normal.  Neck: Normal range of motion. Neck supple.  Cardiovascular: Normal rate, regular rhythm and normal heart sounds.  Pulmonary/Chest: Effort normal and breath sounds normal.  Musculoskeletal: Normal range of motion.       Right lower leg: Normal. She exhibits no edema.       Left lower leg: Normal. She exhibits no edema.  Neurological: She is alert. She has normal strength. No cranial nerve deficit or sensory deficit. She exhibits normal muscle tone. Coordination and gait normal.  Reflex Scores:      Tricep reflexes are 2+ on the right side and 2+ on the left side.      Bicep reflexes are 2+ on the right side and 2+ on the left side.      Brachioradialis reflexes are 2+ on the right side and 2+ on the left side.      Patellar reflexes are 2+ on the right side and 2+ on the left side.      Achilles reflexes are 2+ on the right side and 2+ on the left side. Skin: Skin is warm. She is not diaphoretic. No pallor.  Psychiatric: Her behavior is normal. Thought content normal. Her mood appears anxious.  Anxious mood with congruent affect.  Nursing note and vitals reviewed.  ED Treatments / Results  Labs (all labs ordered are listed, but only abnormal results are displayed) Labs Reviewed  BASIC METABOLIC PANEL - Abnormal; Notable for the following components:       Result Value   Potassium 3.2 (*)    Chloride 96 (*)    Creatinine, Ser 1.04 (*)    GFR calc non Af Amer 58 (*)    All other components within normal limits  CBC  CBG MONITORING, ED  I-STAT TROPONIN, ED    EKG EKG Interpretation  Date/Time:  Saturday  November 25 2017 17:53:02 EDT Ventricular Rate:  69 PR Interval:    QRS Duration: 89 QT Interval:  432 QTC Calculation: 463 R Axis:   71 Text Interpretation:  Sinus rhythm Probable left atrial enlargement tachycardia has improved, otherwise similar to previous Confirmed by Theotis Burrow [phone]) on 11/25/2017 7:42:20 PM   Radiology Dg Chest 2 View  Result Date: 11/25/2017 CLINICAL DATA:  Dyspnea EXAM: CHEST - 2 VIEW COMPARISON:  10/17/2017 chest radiograph. FINDINGS: Surgical repair from ACDF overlies the lower cervical spine. Stable cardiomediastinal silhouette with normal heart size. No pneumothorax. No pleural effusion. Lungs appear clear, with no acute consolidative airspace disease and no pulmonary edema. IMPRESSION: No active cardiopulmonary disease. Electronically Signed   By: Ilona Sorrel M.D.   On: 11/25/2017 20:51    Procedures Procedures (including critical care time)  Medications Ordered in ED Medications  clonazePAM (KLONOPIN) tablet 0.5 mg (0.5 mg Oral Given 11/25/17 2130)     Initial Impression / Assessment and Plan / ED Course  Triage vital signs and the nursing notes have been reviewed.  Pertinent labs & imaging results that were available during care of the patient were reviewed and considered in medical decision making (see chart for details).  Patient presents to the ED with complaints SOB, dizziness, lightheadedness, blurred vision and anxiety. She reports an acute onset with no precipitating factors. She reports that this episode is similar to past anxiety attacks, but states this episode lasted longer. Patient reports running out of her anxiolytic, Xanax, which typically works for these episodes. Physical  exam is unremarkable. No focal neuro deficits or abnormalities appreciated in cardio or pulm portions for exam. Will evaluate for acute cardiac, pulmonary and metabolic causes for today's episode.  Clinical Course as of Nov 26 2142  Sat Nov 25, 2017  1936 EKG showed NSR. No ST elevations/depressions or signs of acute ischemia or infarct.   [GM]  2011 CBG normal.   [GM]  2109 Hypokalemic at 3.2. No associated T wave abnormalities. QT of 432. Will discharge with PO potassium supplements.  Remaining labs unremarkable. Reassuring that troponin is negative to assist in ruling out acute cardiac event. CXR normal.   [GM]    Clinical Course User Index [GM] Alice Burnside, Alvie Heidelberg I, PA-C   Work-up today is unremarkable. Likely anxiety attack given history and current clinical presentation.   Final Clinical Impressions(s) / ED Diagnoses  1. Anxiety Attack. 15 day Rx for Xanax in [place] Controlled Database that was last filled on 10/18/17. Patient has upcoming appointment with PCP and will get new Rx then. Will prescribe Vistaril 50mg  TID PRN for patient today for acute anxiety. Education provided on GAD and anxiety attacks as well as alternative medications (such as SSRIs).  Dispo: Home. After thorough clinical evaluation, this patient is determined to be medically stable and can be safely discharged with the previously mentioned treatment and/or outpatient follow-up/referral(s). At this time, there are no other apparent medical conditions that require further screening, evaluation or treatment.   Final diagnoses:  Acute anxiety    ED Discharge Orders         Ordered    hydrOXYzine (VISTARIL) 50 MG capsule  3 times daily PRN     11/25/17 2141            Junita Push 11/25/17 2144    Little, Wenda Overland, MD 11/27/17 2006

## 2017-12-08 ENCOUNTER — Other Ambulatory Visit: Payer: Self-pay | Admitting: Family Medicine

## 2017-12-08 DIAGNOSIS — I10 Essential (primary) hypertension: Secondary | ICD-10-CM

## 2017-12-18 ENCOUNTER — Ambulatory Visit: Payer: Self-pay | Admitting: Physician Assistant

## 2017-12-28 ENCOUNTER — Other Ambulatory Visit (HOSPITAL_COMMUNITY)
Admission: RE | Admit: 2017-12-28 | Discharge: 2017-12-28 | Disposition: A | Discharge status: Home / Self Care | Payer: Medicaid Other | Source: Ambulatory Visit | Attending: Radiation Oncology | Admitting: Radiation Oncology

## 2017-12-28 ENCOUNTER — Encounter: Payer: Self-pay | Admitting: Radiation Oncology

## 2017-12-28 ENCOUNTER — Other Ambulatory Visit: Payer: Self-pay

## 2017-12-28 ENCOUNTER — Ambulatory Visit
Admission: RE | Admit: 2017-12-28 | Discharge: 2017-12-28 | Disposition: A | Discharge status: Home / Self Care | Payer: Medicaid Other | Source: Ambulatory Visit | Attending: Radiation Oncology | Admitting: Radiation Oncology

## 2017-12-28 ENCOUNTER — Ambulatory Visit: Payer: Medicaid Other | Attending: Radiation Oncology

## 2017-12-28 VITALS — BP 118/65 | HR 79 | Temp 97.9°F | Resp 16 | Ht 61.5 in | Wt 151.2 lb

## 2017-12-28 DIAGNOSIS — F418 Other specified anxiety disorders: Secondary | ICD-10-CM

## 2017-12-28 DIAGNOSIS — Z888 Allergy status to other drugs, medicaments and biological substances status: Secondary | ICD-10-CM | POA: Diagnosis not present

## 2017-12-28 DIAGNOSIS — Z885 Allergy status to narcotic agent status: Secondary | ICD-10-CM | POA: Diagnosis not present

## 2017-12-28 DIAGNOSIS — C531 Malignant neoplasm of exocervix: Secondary | ICD-10-CM | POA: Insufficient documentation

## 2017-12-28 DIAGNOSIS — Z79899 Other long term (current) drug therapy: Secondary | ICD-10-CM | POA: Insufficient documentation

## 2017-12-28 MED ORDER — ALPRAZOLAM 0.25 MG PO TABS: 0.1250 mg | ORAL_TABLET | Freq: Two times a day (BID) | ORAL | 0 refills | Status: DC | PRN

## 2017-12-28 MED ORDER — HYDROMORPHONE HCL 4 MG PO TABS: 4.0000 mg | ORAL_TABLET | Freq: Four times a day (QID) | ORAL | 0 refills | Status: DC | PRN

## 2017-12-28 MED FILL — ALPRAZolam 0.25 MG TABS: 0.25 | 30 days supply | Qty: 30 | Fill #0

## 2017-12-28 MED FILL — HYDROmorphone HCL 4 MG TABS: 4 | 15 days supply | Qty: 60 | Fill #0

## 2017-12-28 NOTE — Progress Notes (Signed)
Radiation Oncology         [phone] ________________________________  Name: Valerie Kerr MRN: [id number]  Date: 12/28/2017  DOB: 1961-01-29  Follow-Up Visit Note  CC: Charlott Rakes, MD  [name], MD    ICD-10-CM   1. 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 The patient was last seen by Dr. [name] on 10/17/17, who noted no evidence of persistent disease on exam. She reports pelvic pain that comes and goes and is a 4/10, and mild fatigue. She denies diarrhea, nausea or vomiting, any urinary bladder changes, vaginal discharge or bleeding, rectal bleeding, and any skin irritation. She states she is not using the vaginal dilator and lost it during her move several months ago.  She reports receiving a shot from her neurosurgeon several months ago for her back pain that has since worn off. She notes taking her pain medication as needed, which she states is about once a week. She notes her sister passed away about 3 weeks ago.   ALLERGIES:  is allergic to codeine; fentanyl; hydrocodone-acetaminophen; and tramadol.  Meds: Current Outpatient Medications  Medication Sig Dispense Refill  . ALPRAZolam (XANAX) 0.25 MG tablet Take 1 tablet (0.25 mg total) by mouth 2 (two) times daily as needed for anxiety. (Patient taking differently: Take 0.125 mg by mouth 2 (two) times daily as needed for anxiety. ) 30 tablet 0  . busPIRone (BUSPAR) 15 MG tablet Take 1 tablet (15 mg total) by mouth 2 (two) times daily. 60 tablet 3  . cetirizine (ZYRTEC) 10 MG tablet Take 1 tablet (10 mg total) by mouth daily. 30 tablet 1   . fluticasone (FLONASE) 50 MCG/ACT nasal spray Place 1 spray into both nostrils daily. 16 g 1  . gabapentin (NEURONTIN) 300 MG capsule TAKE TWO CAPSULES BY MOUTH TWICE DAILY (Patient taking differently: Take 600 mg by mouth 2 (two) times daily. ) 120 capsule 3  . HYDROmorphone (DILAUDID) 4 MG tablet Take 1 tablet (4 mg total) by mouth every 6 (six) hours as needed for severe pain. 60 tablet 0  . lidocaine (LIDODERM) 5 % Place 1 patch onto the skin daily. Remove & Discard patch within 12 hours or as directed by MD 10 patch 0  . lisinopril-hydrochlorothiazide (PRINZIDE,ZESTORETIC) 20-25 MG tablet TAKE ONE TABLET BY MOUTH EVERY DAY 30 tablet 0  . methadone (DOLOPHINE) 10 MG/5ML solution Take 50 mg by mouth daily.     . mirtazapine (REMERON) 15 MG tablet Take 1 tablet (15 mg total) by mouth at bedtime. 30 tablet 3  . omeprazole (PRILOSEC) 20 MG capsule Take 1 capsule (20 mg total) by mouth daily. 30 capsule 3  . prochlorperazine (COMPAZINE) 10 MG tablet Take 1 tablet (10 mg total) by mouth every 6 (six) hours as needed for nausea. 30 tablet 0  . Vitamin D, Ergocalciferol, (DRISDOL) 50000 units CAPS capsule Take 1 capsule (50,000 Units total) by mouth every 7 (seven) days. (Patient taking differently: Take 50,000 Units by mouth every Saturday. ) 16 capsule 0  . VOLTAREN 1 % GEL Apply 4 g topically 4 (four) times daily. 100 g 0  No current facility-administered medications for this encounter.     Physical Findings: The patient is in no acute distress. Patient is alert and oriented.  height is 5' 1.5" (1.562 m) and weight is 151 lb 3.2 oz (68.6 kg). Her oral temperature is 97.9 F (36.6 C). Her blood pressure is 118/65 and her pulse is 79. Her respiration is 16 and oxygen saturation is 97%.   Lungs are clear to auscultation bilaterally. Heart has regular rate and rhythm. No palpable cervical, supraclavicular, or axillary adenopathy. Abdomen soft, non-tender, normal bowel sounds. On pelvic examination  the external genitalia were unremarkable. A speculum exam was performed. There are no mucosal lesions noted in the vaginal vault. Slight erythema noted to the posterior lip of the cervix. A Pap smear was obtained of the cervix. On bimanual and rectovaginal examination the cervix noted to be normal in size. No parametrial extension. Rectal sphincter tone good.   Lab Findings: Lab Results  Component Value Date   WBC 6.9 11/25/2017   HGB 13.4 11/25/2017   HCT 41.0 11/25/2017   MCV 89.1 11/25/2017   PLT 311 11/25/2017    Radiographic Findings: No results found.  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 She was given refills on her dilaudid and xanax. She is using these very infrequently at this time. Overall her back pain was initially improved from her injection by neurosurgery.   Plan:  Routine follow-up in radiation oncology in 6 months. Patient will follow up with Dr. [name] in 3 months. Patient was reissued another dilator and instructions were reviewed.  -----------------------------------  [name], PhD, MD  This document serves as a record of services personally performed by [name], MD. It was created on his behalf by Wilburn Mylar, a trained medical scribe. The creation of this record is based on the scribe's personal observations and the provider's statements to them. This document has been checked and approved by the attending provider.

## 2017-12-28 NOTE — Progress Notes (Signed)
Pt here today for a follow-up appointment for cervical cancer. Pt states that she has pelvic pain that comes and goes. Pt states that it is a 4 out of 10. Pt states mild fatigue. Pt denies having diarrhea. Pt denies having any nausea or vomiting. Pt denies having any urinary bladder changes such as dysuria, hematuria ,urgency or frequency. Pt denies having any vaginal bleeding or discharge. Pt denies having any rectal bleeding. Pt denies having any skin irritation. Pt states that she is not using the vaginal dilator. Will reissue another dilator and instructions have been reviewed. Pt verbalized understanding.  BP 118/65 (BP Location: Left Arm)   Pulse 79   Temp 97.9 F (36.6 C) (Oral)   Resp 16   Ht 5' 1.5" (1.562 m)   Wt 151 lb 3.2 oz (68.6 kg)   SpO2 97%   BMI 28.11 kg/m   Wt Readings from Last 3 Encounters:  12/28/17 151 lb 3.2 oz (68.6 kg)  11/25/17 158 lb (71.7 kg)  10/25/17 156 lb (70.8 kg)

## 2018-01-03 LAB — CYTOLOGY - PAP
Diagnosis: UNDETERMINED — AB
HPV (WINDOPATH): DETECTED — AB

## 2018-01-04 ENCOUNTER — Telehealth: Payer: Self-pay | Admitting: Oncology

## 2018-01-04 NOTE — Telephone Encounter (Addendum)
Called Valerie Kerr and notified her of the results from her pap smear per Dr. Sondra Come.  Advised her that an appointment has been scheduled with Dr. [name] for a colposcopy and biopsy for 01/09/18 at 2:45.  She said she will need to arrange for transportation.  Discussed that if she is not able to find transportation to call back for assistance.  She verbalized understanding and agreement.

## 2018-01-09 ENCOUNTER — Other Ambulatory Visit: Payer: Self-pay | Admitting: Family Medicine

## 2018-01-09 ENCOUNTER — Inpatient Hospital Stay: Payer: Medicaid Other | Attending: Gynecologic Oncology | Admitting: Gynecologic Oncology

## 2018-01-09 ENCOUNTER — Encounter: Payer: Self-pay | Admitting: Gynecologic Oncology

## 2018-01-09 VITALS — BP 131/71 | HR 70 | Temp 98.5°F | Resp 16 | Ht 61.5 in | Wt 149.8 lb

## 2018-01-09 DIAGNOSIS — Z9221 Personal history of antineoplastic chemotherapy: Secondary | ICD-10-CM | POA: Diagnosis not present

## 2018-01-09 DIAGNOSIS — R87810 Cervical high risk human papillomavirus (HPV) DNA test positive: Secondary | ICD-10-CM

## 2018-01-09 DIAGNOSIS — C530 Malignant neoplasm of endocervix: Secondary | ICD-10-CM | POA: Insufficient documentation

## 2018-01-09 DIAGNOSIS — F17210 Nicotine dependence, cigarettes, uncomplicated: Secondary | ICD-10-CM | POA: Diagnosis not present

## 2018-01-09 DIAGNOSIS — R87610 Atypical squamous cells of undetermined significance on cytologic smear of cervix (ASC-US): Secondary | ICD-10-CM | POA: Diagnosis not present

## 2018-01-09 DIAGNOSIS — C539 Malignant neoplasm of cervix uteri, unspecified: Secondary | ICD-10-CM

## 2018-01-09 DIAGNOSIS — Z923 Personal history of irradiation: Secondary | ICD-10-CM | POA: Diagnosis not present

## 2018-01-09 DIAGNOSIS — G629 Polyneuropathy, unspecified: Secondary | ICD-10-CM

## 2018-01-09 MED ORDER — IBUPROFEN 600 MG PO TABS: 600.0000 mg | ORAL_TABLET | Freq: Four times a day (QID) | ORAL | 0 refills | Status: DC | PRN

## 2018-01-09 NOTE — Patient Instructions (Signed)
We will contact you with the results of your biopsies from today.  Follow up with Dr. [name] in Jan 2020 or sooner if needed.  Please call our office at [phone] in November or Dec to schedule for Jan 2020.

## 2018-01-09 NOTE — Progress Notes (Signed)
Follow-up Note: Gyn-Onc  Consult was requested by Dr. [name] for the evaluation of Valerie Kerr 57 y.o. female  CC:  Chief Complaint  Patient presents with  . Malignant neoplasm of endocervix [facility])    Assessment/Plan:  Valerie Kerr  is a 57 y.o.  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  Post treatment PET showed excellent response at cervix. 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 Follow up today's biopsies. No gross malignancy seen on exam.   I will see her back in January, 2020 to follow-up the cervical cancer surveillance.  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  The patient reports last having a pap smear approximately 4 years ago in [place] which, was followed by a biopsy which, per patient, was "normal" and she was not informed that she would require special followup. She began experiencing postmenopausal bleeding at the beginning of 2016 and continued to bleed intermittently for 2 years.   She presented to the ER in [place] in November (25th), 2017 and a cervical mass was identified on pelvic exam.  She was seen in the office by Dr Baron Sane 03/01/16 who performed cervical biopsies of a friable mass which revealed poorly differentiated squamous cell carcinoma. Attempts were made to notify her of this result on 03/03/16, however a phone call was left to call back and there appears to be delay in her receiving the information about her result.  She presented to the office to be seen by Dr [name] for discussion regarding results on 03/16/16 and was informed of the results and the need to see oncology.   A PET had been ordered however, the patient cancelled this due to claustrophobia and fear of having a scan and  getting bad results.  On 04/04/16 she underwent pretreatment PET which showed 5.5 cm hypermetabolic cervical mass, consistent with known primary cervical carcinoma. No definite local or distant metastatic disease.Marland Kitchen  She was treated with primary radiation therapy and radiosensitizing chemotherapy with cisplatin between 04/12/16 and 06/20/16 with 45Gy to the pelvis with 9Gy boost, and 28 Gy additionally to the cervix with intracavitary brachytherapy.  Post treatment imaging on 06/29/16 showed excellent response at cervix but a new slightly enlarged PET avid right inguinal node was present.  Follow-up repeat PET on 10/20/16 showed complete resolution of PET avid findings at the cervix and inguinal node with no apparent malignant disease present.  Interval Hx:   On 12/28/17 she underwent surveillance exam with Dr Sondra Come. No recurrence was seen or felt, however screening pap showed ASCUS positive for high risk HPV. She returns today for colposcopy.   Current Meds:  Outpatient Encounter Medications as of 01/09/2018  Medication Sig  . ALPRAZolam (XANAX) 0.25 MG tablet Take 0.5 tablets (0.125 mg total) by mouth 2 (two) times daily as needed for anxiety.  . busPIRone (BUSPAR) 15 MG tablet Take 1 tablet (15 mg total) by mouth 2 (two) times daily.  . cetirizine (ZYRTEC) 10 MG tablet Take 1 tablet (10 mg total) by mouth daily.  . fluticasone (FLONASE) 50 MCG/ACT nasal spray Place 1 spray into both nostrils daily.  Marland Kitchen gabapentin (NEURONTIN) 300 MG capsule Take 2 capsules (600 mg total) by mouth 2 (two) times daily.  Marland Kitchen HYDROmorphone (DILAUDID) 4 MG tablet Take 1  tablet (4 mg total) by mouth every 6 (six) hours as needed for severe pain.  Marland Kitchen lidocaine (LIDODERM) 5 % Place 1 patch onto the skin daily. Remove & Discard patch within 12 hours or as directed by MD  . lisinopril-hydrochlorothiazide (PRINZIDE,ZESTORETIC) 20-25 MG tablet TAKE ONE TABLET BY MOUTH EVERY DAY  . methadone (DOLOPHINE) 10 MG/5ML solution Take 50  mg by mouth daily.   . mirtazapine (REMERON) 15 MG tablet Take 1 tablet (15 mg total) by mouth at bedtime.  Marland Kitchen omeprazole (PRILOSEC) 20 MG capsule Take 1 capsule (20 mg total) by mouth daily.  . prochlorperazine (COMPAZINE) 10 MG tablet Take 1 tablet (10 mg total) by mouth every 6 (six) hours as needed for nausea.  . Vitamin D, Ergocalciferol, (DRISDOL) 50000 units CAPS capsule Take 1 capsule (50,000 Units total) by mouth every 7 (seven) days. (Patient taking differently: Take 50,000 Units by mouth every Saturday. )  . VOLTAREN 1 % GEL Apply 4 g topically 4 (four) times daily.  Marland Kitchen ibuprofen (ADVIL,MOTRIN) 600 MG tablet Take 1 tablet (600 mg total) by mouth every 6 (six) hours as needed.   No facility-administered encounter medications on file as of 01/09/2018.     Allergy:  Allergies  Allergen Reactions  . Codeine Itching and Rash  . Fentanyl Rash    Skin rash, local swelling from patch  . Hydrocodone-Acetaminophen Rash  . Tramadol Other (See Comments)    GI upset,also trembling sensation    Social Hx:   Social History   Socioeconomic History  . Marital status: Divorced    Spouse name: Not on file  . Number of children: 5  . Years of education: Not on file  . Highest education level: Not on file  Occupational History  . Occupation: disabled  Social Needs  . Financial resource strain: Not on file  . Food insecurity:    Worry: Not on file    Inability: Not on file  . Transportation needs:    Medical: No    Non-medical: No  Tobacco Use  . Smoking status: Current Every Day Smoker    Packs/day: 0.20    Years: 45.00    Pack years: 9.00    Types: Cigarettes  . Smokeless tobacco: Never Used  . Tobacco comment: 1 week since last cigarette  Substance and Sexual Activity  . Alcohol use: No  . Drug use: No  . Sexual activity: Not Currently  Lifestyle  . Physical activity:    Days per week: Not on file    Minutes per session: Not on file  . Stress: Not on file   Relationships  . Social connections:    Talks on phone: Not on file    Gets together: Not on file    Attends religious service: Not on file    Active member of club or organization: Not on file    Attends meetings of clubs or organizations: Not on file    Relationship status: Not on file  . Intimate partner violence:    Fear of current or ex partner: No    Emotionally abused: No    Physically abused: No    Forced sexual activity: No  Other Topics Concern  . Not on file  Social History Narrative   Lives with niece in a 2 story home.  Has 5 children.  On disability.  Formerly worked in Actuary at [employer].  Education: 11th grade.     Past Surgical Hx:  Past Surgical History:  Procedure  Laterality Date  . ABCESS DRAINAGE Left 1982   breast  . CARPAL TUNNEL RELEASE Bilateral   . CERVICAL DISC SURGERY  2010  . MULTIPLE EXTRACTIONS WITH ALVEOLOPLASTY N/A 04/08/2016   Procedure: Extraction of tooth #'s [phone] with alveoloplasty  and gross debridement of remaining teeth;  Surgeon: Lenn Cal, DDS;  Location: [facility];  Service: Oral Surgery;  Laterality: N/A;  . TANDEM RING INSERTION N/A 05/26/2016   Procedure: TANDEM RING INSERTION;  Surgeon: [name], MD;  Location: [facility];  Service: Urology;  Laterality: N/A;  . TANDEM RING INSERTION N/A 06/02/2016   Procedure: TANDEM RING INSERTION;  Surgeon: [name], MD;  Location: [facility];  Service: Urology;  Laterality: N/A;  . TANDEM RING INSERTION N/A 06/20/2016   Procedure: TANDEM RING INSERTION;  Surgeon: [name], MD;  Location[facility];  Service: Urology;  Laterality: N/A;  . TANDEM RING INSERTION N/A 06/23/2016   Procedure: TANDEM RING INSERTION;  Surgeon: [name], MD;  Location: [facility];  Service: Urology;  Laterality: N/A;  . TANDEM RING INSERTION N/A 07/04/2016   Procedure: TANDEM RING INSERTION;  Surgeon: [name], MD;   Location: [facility];  Service: Urology;  Laterality: N/A;    Past Medical Hx:  Past Medical History:  Diagnosis Date  . Anxiety   . Cervical cancer [facility]) oncologist-  dr gorsuch/ dr Sondra Come   02-20-2016 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---  treatment concurrent chemo (week 6 on hold due to pancytopenia) and radiation therapy (external beam 04-12-2016 to 05-31-2016)  started high-dose rate brachytherapy 05-26-2016  . Chemotherapy-induced thrombocytopenia   . Chronic pain disorder    back,neck-- chronic methadone  . Environmental allergies    allergy to dust mites  . GERD (gastroesophageal reflux disease)   . Headache   . Herniated disc, cervical   . History of external beam radiation therapy 04/12/16-05/31/16   pelvis 45 Gy in 25 fractions, in  30 sessions, pelvis boost 9 Gy in 5 fractions  . History of radiation therapy 06/20/16-07/04/16   tandem and ring applicator to cervix 28 Gy in 5 fractions  . Hypertension   . Hypokalemia   . Hypomagnesemia    po supplement and IV replacement  . Leukopenia due to antineoplastic chemotherapy ([place])   . Lumbar herniated disc   . Pancytopenia due to chemotherapy ([place])   . Periodontitis     Past Gynecological History:  Denies history of abnormal paps. Last pap was 4 years ago in [place] per patient. SVD x 5 No LMP recorded. Patient is postmenopausal.  Family Hx:  Family History  Problem Relation Age of Onset  . Cancer Father   . Cancer Sister        throat  . Cancer Brother        throat and stomach  . Cancer Brother        lung    Review of Systems:  Constitutional  Feels fatigue, sadness, generalized weakness, anxious.    ENT Normal appearing ears and nares bilaterally Skin/Breast  No rash, sores, jaundice, itching, dryness Cardiovascular  No chest pain, shortness of breath, or edema  Pulmonary  No cough or wheeze.  Gastro Intestinal  + new symptom of "unable to belch" "feel like  something is stuck in there". No nausea, vomitting, or diarrhoea. No bright red blood per rectum, no abdominal pain, change in bowel movement.  Genito Urinary  No frequency, urgency,  dysuria, . Musculo Skeletal  + myalgia, arthralgia, pain Neurologic  No weakness, numbness, change in gait,  Psychology  No depression, anxiety, insomnia.   Vitals:  Blood pressure 131/71, pulse 70, temperature 98.5 F (36.9 C), resp. rate 16, height 5' 1.5" (1.562 m), weight 149 lb 12.8 oz (67.9 kg), SpO2 100 %.  Physical Exam: WD in NAD Neck  Supple NROM, without any enlargements.  Lymph Node Survey No cervical supraclavicular or inguinal adenopathy Cardiovascular  Pulse normal rate, regularity and rhythm. S1 and S2 normal.  Lungs  Clear to auscultation bilateraly, without wheezes/crackles/rhonchi. Good air movement.  Skin  No rash/lesions/breakdown  Psychiatry  Alert and oriented to person, place, and time  Abdomen  Normoactive bowel sounds, abdomen soft, non-tender and nonobese without evidence of hernia.  Back No CVA tenderness Genito Urinary  Vulva/vagina: Normal external female genitalia.  No lesions. No discharge or bleeding.  Bladder/urethra:  No lesions or masses, well supported bladder  Vagina: grossly normal  Cervix: see colpo note  Uterus:  Small, mobile,  Adnexa: no masses. Rectal  No lesions or masses Extremities  No bilateral cyanosis, clubbing or edema.  COLPOSCOPY PROCEDURE NOTE Preop dx: ASCUS pap, positive for high risk HPV Postop dx: same Procedure: colposcopy of vagina and cervix Surgeon: [name], E EBL: minimal Specimens: cervical biopsy at 1 o'clock, endocervical curette. Procedure and findings:  A speculum was placed in the vagina. 5% acetic acid was applied to the entire vagina and cervix. Due to prior radiation there was anemic tissue. Some slight irregular vessels.  Biopsy taken from 1 o'clock and ECC. No gross lesions seen. No apparent cancer  recurrence.   Thereasa Solo, MD  01/09/2018, 3:43 PM    Lunch

## 2018-01-11 ENCOUNTER — Telehealth: Payer: Self-pay

## 2018-01-11 NOTE — Telephone Encounter (Signed)
Told Valerie Kerr that the pathology showed not pre cancer or cancer. Call in December to schedule a follow up appointment with Dr. [name] in January 2020. Pt verbalized understanding.

## 2018-01-15 ENCOUNTER — Other Ambulatory Visit: Payer: Self-pay | Admitting: Physician Assistant

## 2018-01-15 DIAGNOSIS — F419 Anxiety disorder, unspecified: Secondary | ICD-10-CM

## 2018-01-29 ENCOUNTER — Other Ambulatory Visit: Payer: Self-pay | Admitting: Gynecologic Oncology

## 2018-02-01 ENCOUNTER — Other Ambulatory Visit: Payer: Self-pay | Admitting: Physician Assistant

## 2018-02-01 ENCOUNTER — Other Ambulatory Visit: Payer: Self-pay | Admitting: Family Medicine

## 2018-02-01 DIAGNOSIS — E559 Vitamin D deficiency, unspecified: Secondary | ICD-10-CM

## 2018-02-01 DIAGNOSIS — F419 Anxiety disorder, unspecified: Secondary | ICD-10-CM

## 2018-02-01 DIAGNOSIS — I10 Essential (primary) hypertension: Secondary | ICD-10-CM

## 2018-02-02 ENCOUNTER — Telehealth: Payer: Self-pay | Admitting: Family Medicine

## 2018-02-02 DIAGNOSIS — J0120 Acute ethmoidal sinusitis, unspecified: Secondary | ICD-10-CM

## 2018-02-02 MED ORDER — CETIRIZINE HCL 10 MG PO TABS: 10.0000 mg | ORAL_TABLET | Freq: Every day | ORAL | 2 refills | Status: DC

## 2018-02-02 MED ORDER — FLUTICASONE PROPIONATE 50 MCG/ACT NA SUSP: 1.0000 | Freq: Every day | NASAL | 2 refills | Status: DC

## 2018-02-02 NOTE — Telephone Encounter (Signed)
1) Medication(s) Requested (by name): Fluticasone Cetirizine  2) Pharmacy of Choice: [facility] on [address]

## 2018-02-16 NOTE — Addendum Note (Signed)
Encounter addended by: Loma Sousa, RN on: 02/16/2018 10:53 AM  Actions taken: Order Reconciliation Section accessed

## 2018-02-26 ENCOUNTER — Other Ambulatory Visit: Payer: Self-pay | Admitting: Physician Assistant

## 2018-02-26 ENCOUNTER — Other Ambulatory Visit: Payer: Self-pay | Admitting: Family Medicine

## 2018-02-26 DIAGNOSIS — E559 Vitamin D deficiency, unspecified: Secondary | ICD-10-CM

## 2018-02-26 DIAGNOSIS — G629 Polyneuropathy, unspecified: Secondary | ICD-10-CM

## 2018-03-01 ENCOUNTER — Other Ambulatory Visit: Payer: Self-pay | Admitting: Family Medicine

## 2018-03-01 DIAGNOSIS — E559 Vitamin D deficiency, unspecified: Secondary | ICD-10-CM

## 2018-03-13 ENCOUNTER — Other Ambulatory Visit: Payer: Self-pay | Admitting: Family Medicine

## 2018-03-13 DIAGNOSIS — F419 Anxiety disorder, unspecified: Secondary | ICD-10-CM

## 2018-03-13 DIAGNOSIS — J0120 Acute ethmoidal sinusitis, unspecified: Secondary | ICD-10-CM

## 2018-03-13 DIAGNOSIS — I10 Essential (primary) hypertension: Secondary | ICD-10-CM

## 2018-03-14 NOTE — Telephone Encounter (Signed)
1) Medication(s) Requested (by name):Vitamin D, Ergocalciferol, (DRISDOL) 50000 units CAPS capsule [[id number]   2) Pharmacy of Choice:[facility]   3) Special Requests:   Approved medications will be sent to the pharmacy, we will reach out if there is an issue.  Requests made after 3pm may not be addressed until the following business day!  If a patient is unsure of the name of the medication(s) please note and ask patient to call back when they are able to provide all info, do not send to responsible party until all information is available!

## 2018-03-16 ENCOUNTER — Telehealth: Payer: Self-pay | Admitting: *Deleted

## 2018-03-16 NOTE — Telephone Encounter (Signed)
Called and scheduled the patient for a follow up appt with Dr. Tarri Glenn January

## 2018-03-27 ENCOUNTER — Other Ambulatory Visit: Payer: Self-pay | Admitting: Radiation Oncology

## 2018-03-27 DIAGNOSIS — C531 Malignant neoplasm of exocervix: Secondary | ICD-10-CM

## 2018-03-27 MED ORDER — HYDROMORPHONE HCL 4 MG PO TABS: 4.0000 mg | ORAL_TABLET | Freq: Four times a day (QID) | ORAL | 0 refills | Status: DC | PRN

## 2018-04-09 ENCOUNTER — Other Ambulatory Visit: Payer: Self-pay | Admitting: Family Medicine

## 2018-04-09 DIAGNOSIS — G629 Polyneuropathy, unspecified: Secondary | ICD-10-CM

## 2018-04-10 ENCOUNTER — Other Ambulatory Visit: Payer: Self-pay | Admitting: Family Medicine

## 2018-04-10 DIAGNOSIS — G629 Polyneuropathy, unspecified: Secondary | ICD-10-CM

## 2018-04-12 ENCOUNTER — Other Ambulatory Visit: Payer: Self-pay | Admitting: Family Medicine

## 2018-04-12 DIAGNOSIS — F419 Anxiety disorder, unspecified: Secondary | ICD-10-CM

## 2018-04-13 ENCOUNTER — Other Ambulatory Visit: Payer: Self-pay | Admitting: Family Medicine

## 2018-04-17 ENCOUNTER — Other Ambulatory Visit: Payer: Self-pay | Admitting: Radiation Oncology

## 2018-04-17 DIAGNOSIS — F418 Other specified anxiety disorders: Secondary | ICD-10-CM

## 2018-04-17 MED ORDER — ALPRAZOLAM 0.25 MG PO TABS: 0.1250 mg | ORAL_TABLET | Freq: Two times a day (BID) | ORAL | 0 refills | Status: DC | PRN

## 2018-04-18 ENCOUNTER — Other Ambulatory Visit: Payer: Self-pay | Admitting: Family Medicine

## 2018-04-18 DIAGNOSIS — F419 Anxiety disorder, unspecified: Secondary | ICD-10-CM

## 2018-04-18 DIAGNOSIS — J0120 Acute ethmoidal sinusitis, unspecified: Secondary | ICD-10-CM

## 2018-04-18 DIAGNOSIS — I10 Essential (primary) hypertension: Secondary | ICD-10-CM

## 2018-04-18 DIAGNOSIS — G629 Polyneuropathy, unspecified: Secondary | ICD-10-CM

## 2018-04-21 ENCOUNTER — Other Ambulatory Visit: Payer: Self-pay | Admitting: Family Medicine

## 2018-04-21 DIAGNOSIS — G629 Polyneuropathy, unspecified: Secondary | ICD-10-CM

## 2018-04-21 DIAGNOSIS — F419 Anxiety disorder, unspecified: Secondary | ICD-10-CM

## 2018-04-21 DIAGNOSIS — I10 Essential (primary) hypertension: Secondary | ICD-10-CM

## 2018-04-26 ENCOUNTER — Inpatient Hospital Stay: Payer: Medicaid Other | Attending: Gynecologic Oncology | Admitting: Gynecologic Oncology

## 2018-04-26 ENCOUNTER — Other Ambulatory Visit: Payer: Self-pay | Admitting: Family Medicine

## 2018-04-26 ENCOUNTER — Encounter: Payer: Self-pay | Admitting: Gynecologic Oncology

## 2018-04-26 VITALS — BP 134/71 | HR 86 | Temp 98.0°F | Resp 20 | Ht 61.5 in | Wt 155.0 lb

## 2018-04-26 DIAGNOSIS — F419 Anxiety disorder, unspecified: Secondary | ICD-10-CM

## 2018-04-26 DIAGNOSIS — Z923 Personal history of irradiation: Secondary | ICD-10-CM | POA: Diagnosis not present

## 2018-04-26 DIAGNOSIS — C530 Malignant neoplasm of endocervix: Secondary | ICD-10-CM | POA: Diagnosis not present

## 2018-04-26 DIAGNOSIS — G629 Polyneuropathy, unspecified: Secondary | ICD-10-CM

## 2018-04-26 DIAGNOSIS — Z9221 Personal history of antineoplastic chemotherapy: Secondary | ICD-10-CM | POA: Diagnosis not present

## 2018-04-26 DIAGNOSIS — F17210 Nicotine dependence, cigarettes, uncomplicated: Secondary | ICD-10-CM | POA: Diagnosis not present

## 2018-04-26 DIAGNOSIS — B958 Unspecified staphylococcus as the cause of diseases classified elsewhere: Secondary | ICD-10-CM | POA: Insufficient documentation

## 2018-04-26 DIAGNOSIS — L089 Local infection of the skin and subcutaneous tissue, unspecified: Secondary | ICD-10-CM | POA: Insufficient documentation

## 2018-04-26 DIAGNOSIS — I10 Essential (primary) hypertension: Secondary | ICD-10-CM

## 2018-04-26 MED ORDER — SULFAMETHOXAZOLE-TRIMETHOPRIM 800-160 MG PO TABS: 1.0000 | ORAL_TABLET | Freq: Two times a day (BID) | ORAL | 0 refills | Status: DC

## 2018-04-26 NOTE — Progress Notes (Signed)
Follow-up Note: Gyn-Onc  Consult was requested by Dr. [name] for the evaluation of Valerie Kerr 58 y.o. female  CC:  Chief Complaint  Patient presents with  . Malignant neoplasm of endocervix [facility])    Assessment/Plan:  Valerie Kerr  is a 58 y.o.  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  Post treatment PET showed excellent response at cervix. HPV positive - recommend repeat pap with HPV in October, 2020.  Concern for staph skin infection - prescribed bactrim x 7 days. Recommended to follow-up with PCP if this does not improve symptoms.   I will see Valerie back in July, 2020 to follow-up the cervical cancer surveillance.  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  The patient reports last having a pap smear approximately 4 years ago in [place] which, was followed by a biopsy which, per patient, was "normal" and she was not informed that she would require special followup. She began experiencing postmenopausal bleeding at the beginning of 2016 and continued to bleed intermittently for 2 years.   She presented to the ER in [place] in November (25th), 2017 and a cervical mass was identified on pelvic exam.  She was seen in the office by Dr Baron Sane 03/01/16 who performed cervical biopsies of a friable mass which revealed poorly differentiated squamous cell carcinoma. Attempts were made to notify Valerie of this result on 03/03/16, however a phone call was left to call back and there appears to be delay in Valerie receiving the information about Valerie result.  She presented to the office to be seen by Dr [name] for discussion regarding results on 03/16/16 and  was informed of the results and the need to see oncology.   A PET had been ordered however, the patient cancelled this due to claustrophobia and fear of having a scan and getting bad results.  On 04/04/16 she underwent pretreatment PET which showed 5.5 cm hypermetabolic cervical mass, consistent with known primary cervical carcinoma. No definite local or distant metastatic disease.Marland Kitchen  She was treated with primary radiation therapy and radiosensitizing chemotherapy with cisplatin between 04/12/16 and 06/20/16 with 45Gy to the pelvis with 9Gy boost, and 28 Gy additionally to the cervix with intracavitary brachytherapy.  Post treatment imaging on 06/29/16 showed excellent response at cervix but a new slightly enlarged PET avid right inguinal node was present.  Follow-up repeat PET on 10/20/16 showed complete resolution of PET avid findings at the cervix and inguinal node with no apparent malignant disease present.  Interval Hx:   On 12/28/17 she underwent surveillance exam with Dr Sondra Come. No recurrence was seen or felt, however screening pap showed ASCUS positive for high risk HPV. Colposcopy in October, 2019 was benign with inflammation but no dysplasia or malignancy identified.  She has developed multiple satellite erythematous itchy and painful lesions on the jaw, back and arm. Valerie grandson was treated for this but she does not know what he was treated with.   Current Meds:  Outpatient Encounter Medications as of 04/26/2018  Medication Sig  . ALPRAZolam (XANAX) 0.25 MG tablet Take 0.5 tablets (0.125 mg total) by mouth 2 (two) times daily as needed for anxiety.  . busPIRone (BUSPAR) 15  MG tablet Take 1 tablet (15 mg total) by mouth 2 (two) times daily.  . cetirizine (ZYRTEC) 10 MG tablet Take 1 tablet (10 mg total) by mouth daily.  . fluticasone (FLONASE) 50 MCG/ACT nasal spray Place 1 spray into both nostrils daily.  Marland Kitchen gabapentin (NEURONTIN) 300 MG capsule Take 2 capsules (600 mg total) by mouth 2  (two) times daily.  Marland Kitchen HYDROmorphone (DILAUDID) 4 MG tablet Take 1 tablet (4 mg total) by mouth every 6 (six) hours as needed for severe pain.  Marland Kitchen ibuprofen (ADVIL,MOTRIN) 600 MG tablet Take 1 tablet (600 mg total) by mouth every 6 (six) hours as needed.  Marland Kitchen LIDODERM 5 % Place 1 patch onto the skin daily. Remove & Discard patch within 12 hours or as directed by MD  . lisinopril-hydrochlorothiazide (PRINZIDE,ZESTORETIC) 20-25 MG tablet Take 1 tablet by mouth daily. MUST MAKE APPT FOR FURTHER REFILLS  . methadone (DOLOPHINE) 10 MG/5ML solution Take 50 mg by mouth daily.   . mirtazapine (REMERON) 15 MG tablet Take 1 tablet (15 mg total) by mouth at bedtime.  Marland Kitchen omeprazole (PRILOSEC) 20 MG capsule Take 1 capsule (20 mg total) by mouth daily.  . prochlorperazine (COMPAZINE) 10 MG tablet Take 1 tablet (10 mg total) by mouth every 6 (six) hours as needed for nausea.  . Vitamin D, Ergocalciferol, (DRISDOL) 50000 units CAPS capsule Take 1 capsule (50,000 Units total) by mouth every 7 (seven) days. (Patient taking differently: Take 50,000 Units by mouth every Saturday. )  . VOLTAREN 1 % GEL Apply 4 g topically 4 (four) times daily.  Marland Kitchen sulfamethoxazole-trimethoprim (BACTRIM DS,SEPTRA DS) 800-160 MG tablet Take 1 tablet by mouth 2 (two) times daily.   No facility-administered encounter medications on file as of 04/26/2018.     Allergy:  Allergies  Allergen Reactions  . Codeine Itching and Rash  . Fentanyl Rash    Skin rash, local swelling from patch  . Hydrocodone-Acetaminophen Rash  . Tramadol Other (See Comments)    GI upset,also trembling sensation    Social Hx:   Social History   Socioeconomic History  . Marital status: Divorced    Spouse name: Not on file  . Number of children: 5  . Years of education: Not on file  . Highest education level: Not on file  Occupational History  . Occupation: disabled  Social Needs  . Financial resource strain: Not on file  . Food insecurity:    Worry: Not  on file    Inability: Not on file  . Transportation needs:    Medical: No    Non-medical: No  Tobacco Use  . Smoking status: Current Every Day Smoker    Packs/day: 0.20    Years: 45.00    Pack years: 9.00    Types: Cigarettes  . Smokeless tobacco: Never Used  . Tobacco comment: 1 week since last cigarette  Substance and Sexual Activity  . Alcohol use: No  . Drug use: No  . Sexual activity: Not Currently  Lifestyle  . Physical activity:    Days per week: Not on file    Minutes per session: Not on file  . Stress: Not on file  Relationships  . Social connections:    Talks on phone: Not on file    Gets together: Not on file    Attends religious service: Not on file    Active member of club or organization: Not on file    Attends meetings of clubs or organizations: Not on file  Relationship status: Not on file  . Intimate partner violence:    Fear of current or ex partner: No    Emotionally abused: No    Physically abused: No    Forced sexual activity: No  Other Topics Concern  . Not on file  Social History Narrative   Lives with niece in a 2 story home.  Has 5 children.  On disability.  Formerly worked in Actuary at [employer].  Education: 11th grade.     Past Surgical Hx:  Past Surgical History:  Procedure Laterality Date  . ABCESS DRAINAGE Left 1982   breast  . CARPAL TUNNEL RELEASE Bilateral   . CERVICAL DISC SURGERY  2010  . MULTIPLE EXTRACTIONS WITH ALVEOLOPLASTY N/A 04/08/2016   Procedure: Extraction of tooth #'s [id number] with alveoloplasty  and gross debridement of remaining teeth;  Surgeon: Lenn Cal, DDS;  Location: [place];  Service: Oral Surgery;  Laterality: N/A;  . TANDEM RING INSERTION N/A 05/26/2016   Procedure: TANDEM RING INSERTION;  Surgeon: [name], MD;  Location: [facility];  Service: Urology;  Laterality: N/A;  . TANDEM RING INSERTION N/A 06/02/2016   Procedure: TANDEM RING INSERTION;  Surgeon: [name], MD;   Location[facility];  Service: Urology;  Laterality: N/A;  . TANDEM RING INSERTION N/A 06/20/2016   Procedure: TANDEM RING INSERTION;  Surgeon: [name], MD;  Location: [facility];  Service: Urology;  Laterality: N/A;  . TANDEM RING INSERTION N/A 06/23/2016   Procedure: TANDEM RING INSERTION;  Surgeon: [name], MD;  Location: [facility];  Service: Urology;  Laterality: N/A;  . TANDEM RING INSERTION N/A 07/04/2016   Procedure: TANDEM RING INSERTION;  Surgeon: [name], MD;  Location[facility];  Service: Urology;  Laterality: N/A;    Past Medical Hx:  Past Medical History:  Diagnosis Date  . Anxiety   . Cervical cancer [facility]) oncologist-  dr gorsuch/ dr Sondra Come   02-20-2016 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---  treatment concurrent chemo (week 6 on hold due to pancytopenia) and radiation therapy (external beam 04-12-2016 to 05-31-2016)  started high-dose rate brachytherapy 05-26-2016  . Chemotherapy-induced thrombocytopenia   . Chronic pain disorder    back,neck-- chronic methadone  . Environmental allergies    allergy to dust mites  . GERD (gastroesophageal reflux disease)   . Headache   . Herniated disc, cervical   . History of external beam radiation therapy 04/12/16-05/31/16   pelvis 45 Gy in 25 fractions, in  30 sessions, pelvis boost 9 Gy in 5 fractions  . History of radiation therapy 06/20/16-07/04/16   tandem and ring applicator to cervix 28 Gy in 5 fractions  . Hypertension   . Hypokalemia   . Hypomagnesemia    po supplement and IV replacement  . Leukopenia due to antineoplastic chemotherapy ([place])   . Lumbar herniated disc   . Pancytopenia due to chemotherapy ([place])   . Periodontitis     Past Gynecological History:  Denies history of abnormal paps. Last pap was 4 years ago in [place] per patient. SVD x 5 No LMP recorded. Patient is postmenopausal.  Family Hx:  Family History   Problem Relation Age of Onset  . Cancer Father   . Cancer Sister        throat  . Cancer Brother        throat and stomach  . Cancer Brother  lung    Review of Systems:  Constitutional  Feels fatigue, sadness, generalized weakness, anxious.    ENT Normal appearing ears and nares bilaterally Skin/Breast  +sores, no jaundice, itching, dryness Cardiovascular  No chest pain, shortness of breath, or edema  Pulmonary  No cough or wheeze.  Gastro Intestinal  No nausea, vomitting, or diarrhoea. No bright red blood per rectum, no abdominal pain, change in bowel movement.  Genito Urinary  No frequency, urgency, dysuria, . Musculo Skeletal  + myalgia, arthralgia, pain Neurologic  No weakness, numbness, change in gait,  Psychology  No depression, anxiety, insomnia.   Vitals:  Blood pressure 134/71, pulse 86, temperature 98 F (36.7 C), resp. rate 20, height 5' 1.5" (1.562 m), weight 155 lb (70.3 kg), SpO2 98 %.  Physical Exam: WD in NAD Neck  Supple NROM, without any enlargements.  Lymph Node Survey No cervical supraclavicular or inguinal adenopathy Cardiovascular  Pulse normal rate, regularity and rhythm. S1 and S2 normal.  Lungs  Clear to auscultation bilateraly, without wheezes/crackles/rhonchi. Good air movement.  Skin  + erythematous, raised 1-2cm lesions on right forearm (with pustules) and right shoulder/back and left jawline.  Psychiatry  Alert and oriented to person, place, and time  Abdomen  Normoactive bowel sounds, abdomen soft, non-tender and nonobese without evidence of hernia.  Back No CVA tenderness Genito Urinary  Vulva/vagina: Normal external female genitalia.  No lesions. No discharge or bleeding.  Bladder/urethra:  No lesions or masses, well supported bladder  Vagina: grossly normal  Cervix: normal, atrophic, flush with vagina.  Uterus:  Small, mobile,  Adnexa: no masses. Rectal  No lesions or masses Extremities  No bilateral cyanosis,  clubbing or edema.    Thereasa Solo, MD  04/26/2018, 3:06 PM    Lunch

## 2018-04-26 NOTE — Patient Instructions (Signed)
Please notify Dr [name] at phone number [phone] if you notice vaginal bleeding, new pelvic or abdominal pains, bloating, feeling full easy, or a change in bladder or bowel function.   Please return to see Dr Sondra Come as scheduled in March.  Please contact Dr Serita Grit office (at [phone]) in April, 2020 to request an appointment with her for July, 2020.

## 2018-05-07 ENCOUNTER — Other Ambulatory Visit: Payer: Self-pay | Admitting: Family Medicine

## 2018-05-07 DIAGNOSIS — I10 Essential (primary) hypertension: Secondary | ICD-10-CM

## 2018-05-07 DIAGNOSIS — F419 Anxiety disorder, unspecified: Secondary | ICD-10-CM

## 2018-05-07 DIAGNOSIS — G629 Polyneuropathy, unspecified: Secondary | ICD-10-CM

## 2018-05-07 DIAGNOSIS — J0120 Acute ethmoidal sinusitis, unspecified: Secondary | ICD-10-CM

## 2018-05-15 ENCOUNTER — Emergency Department (HOSPITAL_COMMUNITY)
Admission: EM | Admit: 2018-05-15 | Discharge: 2018-05-16 | Disposition: A | Discharge status: Home / Self Care | Payer: Medicaid Other | Attending: Emergency Medicine | Admitting: Emergency Medicine

## 2018-05-15 ENCOUNTER — Encounter (HOSPITAL_COMMUNITY): Payer: Self-pay

## 2018-05-15 ENCOUNTER — Other Ambulatory Visit: Payer: Self-pay

## 2018-05-15 DIAGNOSIS — F17210 Nicotine dependence, cigarettes, uncomplicated: Secondary | ICD-10-CM | POA: Insufficient documentation

## 2018-05-15 DIAGNOSIS — I10 Essential (primary) hypertension: Secondary | ICD-10-CM | POA: Insufficient documentation

## 2018-05-15 DIAGNOSIS — B029 Zoster without complications: Secondary | ICD-10-CM | POA: Insufficient documentation

## 2018-05-15 DIAGNOSIS — Z79899 Other long term (current) drug therapy: Secondary | ICD-10-CM | POA: Insufficient documentation

## 2018-05-15 MED ORDER — DIPHENHYDRAMINE HCL 25 MG PO CAPS
25.0000 mg | ORAL_CAPSULE | Freq: Once | ORAL | Status: AC
  Administered 2018-05-16: 25 mg via ORAL
  Filled 2018-05-15: qty 1

## 2018-05-15 MED ORDER — ACYCLOVIR 400 MG PO TABS
800.0000 mg | ORAL_TABLET | Freq: Once | ORAL | Status: AC
  Administered 2018-05-16: 800 mg via ORAL
  Filled 2018-05-15: qty 2

## 2018-05-15 MED ORDER — IPRATROPIUM-ALBUTEROL 0.5-2.5 (3) MG/3ML IN SOLN
RESPIRATORY_TRACT | Status: AC
  Filled 2018-05-15: qty 3

## 2018-05-15 MED ORDER — ACYCLOVIR 400 MG PO TABS: 800.0000 mg | ORAL_TABLET | Freq: Every day | ORAL | 0 refills | Status: DC

## 2018-05-15 MED ORDER — IBUPROFEN 200 MG PO TABS
600.0000 mg | ORAL_TABLET | Freq: Once | ORAL | Status: AC
  Administered 2018-05-16: 600 mg via ORAL
  Filled 2018-05-15: qty 3

## 2018-05-15 NOTE — ED Notes (Signed)
Duoneb ordered in error.

## 2018-05-15 NOTE — ED Triage Notes (Signed)
Pt arrived with a rash on her right wrist/arm that's been there for about two weeks. Pt stating her family has similar rash and has been using their cream with no relief. Pt stating it hurts and itches.

## 2018-05-15 NOTE — ED Notes (Signed)
Pt states her grandsons and her daughter all have the same type of rash.

## 2018-05-15 NOTE — ED Notes (Signed)
Bed: WA03 Expected date:  Expected time:  Means of arrival:  Comments: 

## 2018-05-15 NOTE — ED Provider Notes (Signed)
[facility] DEPT Provider Note   CSN: [id number] Arrival date & time: 05/15/18  2047    History   Chief Complaint Chief Complaint  Patient presents with  . Rash    HPI Valerie Kerr is a 58 y.o. female.     HPI  Pt is a 58 y/o female with a h/o cervical cancer (in remission), GERD, HTN, who presents to the ED today c/o a rash that began about 1-2 weeks ago. Sxs have been constant and have worsened since onset. States that the rash is red. It is itching and burns. States she has a spot on her right forearm and on her back.   States that several family members have the same rash and she tried using some of her grandsons cream, clotrimazole for the symptoms but they did not improve.   Reports chills, sweats. Denies URI sxs, abd pain, NVD, urinary sxs.  Past Medical History:  Diagnosis Date  . Anxiety   . Cervical cancer [facility]) oncologist-  dr gorsuch/ dr Sondra Come   02-20-2016 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---  treatment concurrent chemo (week 6 on hold due to pancytopenia) and radiation therapy (external beam 04-12-2016 to 05-31-2016)  started high-dose rate brachytherapy 05-26-2016  . Chemotherapy-induced thrombocytopenia   . Chronic pain disorder    back,neck-- chronic methadone  . Environmental allergies    allergy to dust mites  . GERD (gastroesophageal reflux disease)   . Headache   . Herniated disc, cervical   . History of external beam radiation therapy 04/12/16-05/31/16   pelvis 45 Gy in 25 fractions, in  30 sessions, pelvis boost 9 Gy in 5 fractions  . History of radiation therapy 06/20/16-07/04/16   tandem and ring applicator to cervix 28 Gy in 5 fractions  . Hypertension   . Hypokalemia   . Hypomagnesemia    po supplement and IV replacement  . Leukopenia due to antineoplastic chemotherapy ([place])   . Lumbar herniated disc   . Pancytopenia due to chemotherapy ([place])   . Periodontitis     Patient Active  Problem List   Diagnosis Date Noted  . Spinal stenosis 01/18/2017  . Insomnia 01/18/2017  . Lumbar herniated disc 09/05/2016  . Cervical radiculopathy 09/05/2016  . Neuropathy 09/05/2016  . Depression 08/15/2016  . Inguinal lymphadenopathy 06/30/2016  . Thrombocytopenia ([place]) 05/24/2016  . Dehydration 05/24/2016  . Anxiety 05/24/2016  . Nausea and vomiting 05/17/2016  . Leukopenia due to antineoplastic chemotherapy ([place]) 05/12/2016  . Hypomagnesemia 05/12/2016  . Hypokalemia 05/12/2016  . Anemia due to antineoplastic chemotherapy 05/04/2016  . Continuous tobacco abuse 04/01/2016  . Poor dentition 04/01/2016  . Essential hypertension 04/01/2016  . Methadone use ([place]) 04/01/2016  . Allergic sinusitis 04/01/2016  . Gastroesophageal reflux disease 04/01/2016  . Cervical cancer ([place]) 03/03/2016    Past Surgical History:  Procedure Laterality Date  . ABCESS DRAINAGE Left 1982   breast  . CARPAL TUNNEL RELEASE Bilateral   . CERVICAL DISC SURGERY  2010  . MULTIPLE EXTRACTIONS WITH ALVEOLOPLASTY N/A 04/08/2016   Procedure: Extraction of tooth #'s [id number] with alveoloplasty  and gross debridement of remaining teeth;  Surgeon: Lenn Cal, DDS;  Location: [place];  Service: Oral Surgery;  Laterality: N/A;  . TANDEM RING INSERTION N/A 05/26/2016   Procedure: TANDEM RING INSERTION;  Surgeon: [name], MD;  [facility];  Service: Urology;  Laterality: N/A;  . TANDEM RING INSERTION N/A 06/02/2016   Procedure: TANDEM RING INSERTION;  Surgeon: [name], MD;  Location: [facility];  Service: Urology;  Laterality: N/A;  . TANDEM RING INSERTION N/A 06/20/2016   Procedure: TANDEM RING INSERTION;  Surgeon: [name], MD;  Location: [facility];  Service: Urology;  Laterality: N/A;  . TANDEM RING INSERTION N/A 06/23/2016   Procedure: TANDEM RING INSERTION;  Surgeon: [name], MD;  Location: [facility];  Service:  Urology;  Laterality: N/A;  . TANDEM RING INSERTION N/A 07/04/2016   Procedure: TANDEM RING INSERTION;  Surgeon: [name], MD;  Location: [facility];  Service: Urology;  Laterality: N/A;     OB History   No obstetric history on file.      Home Medications    Prior to Admission medications   Medication Sig Start Date End Date Taking? Authorizing Provider  acyclovir (ZOVIRAX) 800 MG tablet Take 1 tablet (800 mg total) by mouth 5 (five) times daily for 7 days. 05/16/18 05/23/18  Dorena Dorfman S, PA-C  ALPRAZolam (XANAX) 0.25 MG tablet Take 0.5 tablets (0.125 mg total) by mouth 2 (two) times daily as needed for anxiety. 04/17/18   [name], MD  busPIRone (BUSPAR) 15 MG tablet Take 1 tablet (15 mg total) by mouth 2 (two) times daily. 04/16/18   Charlott Rakes, MD  cetirizine (ZYRTEC) 10 MG tablet Take 1 tablet (10 mg total) by mouth daily. 03/16/18   Charlott Rakes, MD  fluticasone (FLONASE) 50 MCG/ACT nasal spray Place 1 spray into both nostrils daily. 02/02/18   Charlott Rakes, MD  gabapentin (NEURONTIN) 300 MG capsule Take 2 capsules (600 mg total) by mouth 2 (two) times daily. 03/01/18   Charlott Rakes, MD  HYDROmorphone (DILAUDID) 4 MG tablet Take 1 tablet (4 mg total) by mouth every 6 (six) hours as needed for severe pain. 03/27/18   [name], MD  ibuprofen (ADVIL,MOTRIN) 600 MG tablet Take 1 tablet (600 mg total) by mouth every 6 (six) hours as needed. 01/31/18   [name], MD  LIDODERM 5 % Place 1 patch onto the skin daily. Remove & Discard patch within 12 hours or as directed by MD 04/16/18   Charlott Rakes, MD  lisinopril-hydrochlorothiazide (PRINZIDE,ZESTORETIC) 20-25 MG tablet Take 1 tablet by mouth daily. MUST MAKE APPT FOR FURTHER REFILLS 04/19/18   Charlott Rakes, MD  methadone (DOLOPHINE) 10 MG/5ML solution Take 50 mg by mouth daily.     [provider]  mirtazapine (REMERON) 15 MG tablet Take 1 tablet (15 mg total) by mouth at bedtime.  01/18/17   Charlott Rakes, MD  omeprazole (PRILOSEC) 20 MG capsule Take 1 capsule (20 mg total) by mouth daily. 05/19/17   Charlott Rakes, MD  prochlorperazine (COMPAZINE) 10 MG tablet Take 1 tablet (10 mg total) by mouth every 6 (six) hours as needed for nausea. 10/12/17   [name], MD  sulfamethoxazole-trimethoprim (BACTRIM DS,SEPTRA DS) 800-160 MG tablet Take 1 tablet by mouth 2 (two) times daily. 04/26/18   [name], MD  Vitamin D, Ergocalciferol, (DRISDOL) 50000 units CAPS capsule Take 1 capsule (50,000 Units total) by mouth every 7 (seven) days. Patient taking differently: Take 50,000 Units by mouth every Saturday.  10/26/17   Argentina Donovan, PA-C  VOLTAREN 1 % GEL Apply 4 g topically 4 (four) times daily. 03/31/17   Charlott Rakes, MD    Family History Family History  Problem Relation Age of Onset  . Cancer Father   . Cancer Sister        throat  .  Cancer Brother        throat and stomach  . Cancer Brother        lung    Social History Social History   Tobacco Use  . Smoking status: Current Every Day Smoker    Packs/day: 0.20    Years: 45.00    Pack years: 9.00    Types: Cigarettes  . Smokeless tobacco: Never Used  . Tobacco comment: 1 week since last cigarette  Substance Use Topics  . Alcohol use: No  . Drug use: No     Allergies   Codeine; Fentanyl; Hydrocodone-acetaminophen; and Tramadol   Review of Systems Review of Systems  Constitutional: Positive for chills and diaphoresis.  HENT: Negative for congestion, rhinorrhea and sore throat.   Eyes: Negative for visual disturbance.  Respiratory: Negative for shortness of breath.   Cardiovascular: Negative for chest pain.  Gastrointestinal: Negative for abdominal pain, constipation, diarrhea, nausea and vomiting.  Genitourinary: Negative for pelvic pain.  Musculoskeletal: Negative for back pain, neck pain and neck stiffness.  Skin: Positive for rash.  Neurological: Negative for headaches.   Physical  Exam Updated Vital Signs BP 104/62 (BP Location: Left Arm)   Pulse 85   Temp 98.6 F (37 C) (Oral)   Resp 18   SpO2 97%   Physical Exam Vitals signs and nursing note reviewed.  Constitutional:      General: She is not in acute distress.    Appearance: She is well-developed.  HENT:     Head: Normocephalic and atraumatic.     Right Ear: Tympanic membrane normal.     Left Ear: Tympanic membrane normal.  Eyes:     Conjunctiva/sclera: Conjunctivae normal.     Pupils: Pupils are equal, round, and reactive to light.     Comments: No lesions to the bilat eyes  Neck:     Musculoskeletal: Neck supple. No neck rigidity.     Comments: Non meningeal signs Cardiovascular:     Rate and Rhythm: Normal rate and regular rhythm.  Pulmonary:     Effort: Pulmonary effort is normal.     Comments: Equal chest rise and fall Abdominal:     Palpations: Abdomen is soft.     Tenderness: There is no abdominal tenderness.  Skin:    General: Skin is warm and dry.     Comments: Vesicular rash to the right upper extremity consistent with shingles. TTP. No evidence of super infection. She noted rash to left upper back, viewed are of concern and there is very small erythematous patch not consistent with shingles or other concerning rash. Not consistent with cellulitis. Right forearm rash pictured below.   Neurological:     Mental Status: She is alert.            ED Treatments / Results  Labs (all labs ordered are listed, but only abnormal results are displayed) Labs Reviewed - No data to display  EKG None  Radiology No results found.  Procedures Procedures (including critical care time)  Medications Ordered in ED Medications  diphenhydrAMINE (BENADRYL) capsule 25 mg (25 mg Oral Given 05/16/18 0010)  ibuprofen (ADVIL,MOTRIN) tablet 600 mg (600 mg Oral Given 05/16/18 0011)  acyclovir (ZOVIRAX) tablet 800 mg (800 mg Oral Given 05/16/18 0012)     Initial Impression / Assessment and Plan /  ED Course  I have reviewed the triage vital signs and the nursing notes.  Pertinent labs & imaging results that were available during my care of the patient were  reviewed by me and considered in my medical decision making (see chart for details).      Final Clinical Impressions(s) / ED Diagnoses   Final diagnoses:  Herpes zoster without complication   Patient presenting to the emergency department today for evaluation of a rash to the right upper extremity has been present for the last several days.  Is associated with body aches subjective fevers and chills.  On exam patient has vesicular rash to the right forearm consistent with shingles.  It is pictured above.  She is nontoxic appearing and is afebrile.  No ophthalmologic or otic involvement.  Will give Rx for acyclovir and have her follow-up with her PCP.  Have advised her to return the ER for new or worsening symptoms.  She voiced understanding the plan reasons return.  All questions answered.  Patient stable at discharge.  ED Discharge Orders         Ordered    acyclovir (ZOVIRAX) 800 MG tablet  5 times daily     05/16/18 0011    acyclovir (ZOVIRAX) 400 MG tablet  5 times daily,   Status:  Discontinued     05/15/18 2357           Jillyan Plitt S, PA-C 05/16/18 0016    Daleen Bo, MD 05/16/18 0028

## 2018-05-15 NOTE — Discharge Instructions (Addendum)
Take prescription as directed.   You may rotate tylenol and motrin for pain.  Please follow up with your primary care provider within 5-7 days for re-evaluation of your symptoms.   Please return to the emergency room immediately if you experience any new or worsening symptoms or any symptoms that indicate worsening infection such as fevers, increased redness/swelling/pain, warmth, or drainage from the affected area.

## 2018-05-16 ENCOUNTER — Other Ambulatory Visit: Payer: Self-pay | Admitting: Physician Assistant

## 2018-05-16 DIAGNOSIS — E559 Vitamin D deficiency, unspecified: Secondary | ICD-10-CM

## 2018-05-16 MED ORDER — ACYCLOVIR 800 MG PO TABS: 800.0000 mg | ORAL_TABLET | Freq: Every day | ORAL | 0 refills | Status: AC

## 2018-05-16 NOTE — ED Notes (Signed)
Pt verbalized discharge instructions and follow up care. Alert and ambulatory  

## 2018-05-17 ENCOUNTER — Other Ambulatory Visit: Payer: Self-pay | Admitting: Radiation Oncology

## 2018-05-17 DIAGNOSIS — C531 Malignant neoplasm of exocervix: Secondary | ICD-10-CM

## 2018-05-17 MED ORDER — PROCHLORPERAZINE MALEATE 10 MG PO TABS: 10.0000 mg | ORAL_TABLET | Freq: Four times a day (QID) | ORAL | 0 refills | Status: DC | PRN

## 2018-05-17 MED ORDER — HYDROMORPHONE HCL 4 MG PO TABS: 4.0000 mg | ORAL_TABLET | Freq: Four times a day (QID) | ORAL | 0 refills | Status: DC | PRN

## 2018-05-18 ENCOUNTER — Other Ambulatory Visit: Payer: Self-pay | Admitting: Family Medicine

## 2018-05-18 DIAGNOSIS — I10 Essential (primary) hypertension: Secondary | ICD-10-CM

## 2018-05-30 ENCOUNTER — Other Ambulatory Visit: Payer: Self-pay | Admitting: Family Medicine

## 2018-05-30 DIAGNOSIS — J0120 Acute ethmoidal sinusitis, unspecified: Secondary | ICD-10-CM

## 2018-05-31 ENCOUNTER — Ambulatory Visit: Payer: Medicaid Other | Admitting: Radiation Oncology

## 2018-06-04 ENCOUNTER — Ambulatory Visit: Payer: Medicaid Other | Admitting: Radiation Oncology

## 2018-06-21 ENCOUNTER — Other Ambulatory Visit: Payer: Self-pay | Admitting: Family Medicine

## 2018-06-21 DIAGNOSIS — G629 Polyneuropathy, unspecified: Secondary | ICD-10-CM

## 2018-06-21 DIAGNOSIS — I10 Essential (primary) hypertension: Secondary | ICD-10-CM

## 2018-06-21 NOTE — Telephone Encounter (Signed)
1) Medication(s) Requested (by name): Gabapentin Lisinopril ibprofun  2) [facility]  [address] [place] 3) Special Requests:   Approved medications will be sent to the pharmacy, we will reach out if there is an issue.  Requests made after 3pm may not be addressed until the following business day!  If a patient is unsure of the name of the medication(s) please note and ask patient to call back when they are able to provide all info, do not send to responsible party until all information is available!

## 2018-06-22 ENCOUNTER — Telehealth: Payer: Self-pay | Admitting: Family Medicine

## 2018-06-22 MED ORDER — GABAPENTIN 300 MG PO CAPS: 600.0000 mg | ORAL_CAPSULE | Freq: Two times a day (BID) | ORAL | 1 refills | Status: DC

## 2018-06-22 MED ORDER — IBUPROFEN 600 MG PO TABS: 600.0000 mg | ORAL_TABLET | Freq: Four times a day (QID) | ORAL | 0 refills | Status: DC | PRN

## 2018-06-22 MED ORDER — LISINOPRIL-HYDROCHLOROTHIAZIDE 20-25 MG PO TABS: 1.0000 | ORAL_TABLET | Freq: Every day | ORAL | 0 refills | Status: DC

## 2018-06-22 NOTE — Telephone Encounter (Signed)
1) Medication(s) Requested (by name):LIDODERM 5 % [[id number]]   2) [facility]  [place]   3) Special Requests:   Approved medications will be sent to the pharmacy, we will reach out if there is an issue.  Requests made after 3pm may not be addressed until the following business day!  If a patient is unsure of the name of the medication(s) please note and ask patient to call back when they are able to provide all info, do not send to responsible party until all information is available!

## 2018-06-25 MED ORDER — LIDOCAINE 5 % EX PTCH: MEDICATED_PATCH | CUTANEOUS | 2 refills | Status: DC

## 2018-06-25 NOTE — Telephone Encounter (Signed)
Called the patient and informed her that the perscription was sent.

## 2018-06-25 NOTE — Telephone Encounter (Signed)
Refilled

## 2018-07-03 ENCOUNTER — Telehealth: Payer: Self-pay | Admitting: *Deleted

## 2018-07-03 ENCOUNTER — Other Ambulatory Visit: Payer: Self-pay | Admitting: Radiation Oncology

## 2018-07-03 DIAGNOSIS — F418 Other specified anxiety disorders: Secondary | ICD-10-CM

## 2018-07-03 DIAGNOSIS — C531 Malignant neoplasm of exocervix: Secondary | ICD-10-CM

## 2018-07-03 MED ORDER — HYDROMORPHONE HCL 4 MG PO TABS: 4.0000 mg | ORAL_TABLET | Freq: Four times a day (QID) | ORAL | 0 refills | Status: DC | PRN

## 2018-07-03 MED ORDER — ALPRAZOLAM 0.25 MG PO TABS: 0.1250 mg | ORAL_TABLET | Freq: Two times a day (BID) | ORAL | 0 refills | Status: DC | PRN

## 2018-07-03 NOTE — Telephone Encounter (Signed)
CALLED PATIENT TO ASK ABOUT RESCHEDULING FU FOR 07-05-18 PATIENT AGREED TO COME ON 09-03-18 @ 3:15 PM

## 2018-07-05 ENCOUNTER — Ambulatory Visit: Payer: Self-pay | Admitting: Radiation Oncology

## 2018-07-10 ENCOUNTER — Other Ambulatory Visit: Payer: Self-pay | Admitting: Pharmacist

## 2018-07-10 DIAGNOSIS — G629 Polyneuropathy, unspecified: Secondary | ICD-10-CM

## 2018-07-10 DIAGNOSIS — F419 Anxiety disorder, unspecified: Secondary | ICD-10-CM

## 2018-07-10 DIAGNOSIS — I10 Essential (primary) hypertension: Secondary | ICD-10-CM

## 2018-07-10 MED ORDER — BUSPIRONE HCL 15 MG PO TABS: 15.0000 mg | ORAL_TABLET | Freq: Two times a day (BID) | ORAL | 0 refills | Status: DC

## 2018-07-10 MED ORDER — LISINOPRIL-HYDROCHLOROTHIAZIDE 20-25 MG PO TABS: 1.0000 | ORAL_TABLET | Freq: Every day | ORAL | 0 refills | Status: DC

## 2018-07-10 NOTE — Telephone Encounter (Signed)
Patient's pharmacy requests refills for gabapentin, Zestoretic, and buspirone. Has not been seen in clinic since 10/25/17. Will defer to patient's PCP.

## 2018-07-27 ENCOUNTER — Other Ambulatory Visit: Payer: Self-pay | Admitting: Family Medicine

## 2018-07-27 DIAGNOSIS — J0120 Acute ethmoidal sinusitis, unspecified: Secondary | ICD-10-CM

## 2018-07-27 NOTE — Telephone Encounter (Signed)
New Message   1) Medication(s) Requested (by name): fluticasone (FLONASE) 50 MCG/ACT nasal spray and cetirizine (ZYRTEC) 10 MG tablet  2) [facility] [address] [facility] [place]  3) Special Requests:   Approved medications will be sent to the pharmacy, we will reach out if there is an issue.  Requests made after 3pm may not be addressed until the following business day!  If a patient is unsure of the name of the medication(s) please note and ask patient to call back when they are able to provide all info, do not send to responsible party until all information is available!

## 2018-07-30 NOTE — Telephone Encounter (Signed)
New Message   Pt is calling, states she is still waiting for the Dr.Newling to authorize her lidocaine (LIDODERM) 5 % and cetirizine (ZYRTEC) 10 MG tablet

## 2018-07-31 ENCOUNTER — Telehealth: Payer: Self-pay

## 2018-07-31 MED ORDER — LIDOCAINE 5 % EX PTCH: MEDICATED_PATCH | CUTANEOUS | 2 refills | Status: DC

## 2018-07-31 MED ORDER — CETIRIZINE HCL 10 MG PO TABS: 10.0000 mg | ORAL_TABLET | Freq: Every day | ORAL | 2 refills | Status: DC

## 2018-07-31 MED ORDER — FLUTICASONE PROPIONATE 50 MCG/ACT NA SUSP: 1.0000 | Freq: Every day | NASAL | 2 refills | Status: DC

## 2018-07-31 NOTE — Addendum Note (Signed)
Addended by: Frederich Cha on: 07/31/2018 11:00 AM   Modules accepted: Orders

## 2018-08-01 NOTE — Telephone Encounter (Signed)
error 

## 2018-08-17 ENCOUNTER — Telehealth: Payer: Self-pay | Admitting: Family Medicine

## 2018-08-17 MED ORDER — LIDOCAINE 5 % EX PTCH: MEDICATED_PATCH | CUTANEOUS | 0 refills | Status: DC

## 2018-08-17 NOTE — Telephone Encounter (Signed)
Patient called and stated she wanted her pain patch send to Valerie Kerr in [place] at [place]

## 2018-08-17 NOTE — Telephone Encounter (Signed)
Patient is are of script being sent for a month supply

## 2018-08-24 NOTE — Telephone Encounter (Signed)
Patient called stating that  Suzie Portela is asking for a prior auth on lidocaine (LIDODERM) 5 % Pelase f/u with pt.

## 2018-08-29 NOTE — Telephone Encounter (Signed)
PA was approved on 08/08/18 and is effective thru 03/28/19

## 2018-09-03 ENCOUNTER — Ambulatory Visit: Payer: Medicaid - Out of State | Admitting: Radiation Oncology

## 2018-09-03 ENCOUNTER — Telehealth: Payer: Self-pay | Admitting: *Deleted

## 2018-09-03 NOTE — Telephone Encounter (Signed)
Called patient to inform that fu appt. has been moved to 09-24-18 @ 2:30 pm with Dr. Sondra Come, spoke with patient and she is aware of this appt.

## 2018-09-10 ENCOUNTER — Other Ambulatory Visit: Payer: Self-pay | Admitting: Internal Medicine

## 2018-09-10 ENCOUNTER — Other Ambulatory Visit: Payer: Self-pay | Admitting: Family Medicine

## 2018-09-10 DIAGNOSIS — J0120 Acute ethmoidal sinusitis, unspecified: Secondary | ICD-10-CM

## 2018-09-10 DIAGNOSIS — I10 Essential (primary) hypertension: Secondary | ICD-10-CM

## 2018-09-14 ENCOUNTER — Other Ambulatory Visit: Payer: Self-pay | Admitting: Internal Medicine

## 2018-09-14 DIAGNOSIS — I10 Essential (primary) hypertension: Secondary | ICD-10-CM

## 2018-09-21 ENCOUNTER — Other Ambulatory Visit: Payer: Self-pay | Admitting: Internal Medicine

## 2018-09-21 DIAGNOSIS — I10 Essential (primary) hypertension: Secondary | ICD-10-CM

## 2018-09-24 ENCOUNTER — Encounter: Payer: Self-pay | Admitting: Radiation Oncology

## 2018-09-24 ENCOUNTER — Other Ambulatory Visit: Payer: Self-pay

## 2018-09-24 ENCOUNTER — Ambulatory Visit
Admission: RE | Admit: 2018-09-24 | Discharge: 2018-09-24 | Disposition: A | Discharge status: Home / Self Care | Payer: Medicare Other | Source: Ambulatory Visit | Attending: Radiation Oncology | Admitting: Radiation Oncology

## 2018-09-24 VITALS — BP 140/60 | HR 74 | Temp 99.3°F | Resp 20 | Ht 61.0 in | Wt 175.6 lb

## 2018-09-24 DIAGNOSIS — Z923 Personal history of irradiation: Secondary | ICD-10-CM | POA: Insufficient documentation

## 2018-09-24 DIAGNOSIS — M549 Dorsalgia, unspecified: Secondary | ICD-10-CM | POA: Insufficient documentation

## 2018-09-24 DIAGNOSIS — K5900 Constipation, unspecified: Secondary | ICD-10-CM | POA: Diagnosis not present

## 2018-09-24 DIAGNOSIS — Z8541 Personal history of malignant neoplasm of cervix uteri: Secondary | ICD-10-CM | POA: Insufficient documentation

## 2018-09-24 DIAGNOSIS — C531 Malignant neoplasm of exocervix: Secondary | ICD-10-CM

## 2018-09-24 DIAGNOSIS — C530 Malignant neoplasm of endocervix: Secondary | ICD-10-CM

## 2018-09-24 DIAGNOSIS — Z79899 Other long term (current) drug therapy: Secondary | ICD-10-CM | POA: Insufficient documentation

## 2018-09-24 DIAGNOSIS — R87810 Cervical high risk human papillomavirus (HPV) DNA test positive: Secondary | ICD-10-CM | POA: Insufficient documentation

## 2018-09-24 MED ORDER — HYDROMORPHONE HCL 4 MG PO TABS: 4.0000 mg | ORAL_TABLET | Freq: Four times a day (QID) | ORAL | 0 refills | Status: DC | PRN

## 2018-09-24 NOTE — Patient Instructions (Signed)
Coronavirus (COVID-19) Are you at risk?  Are you at risk for the Coronavirus (COVID-19)?  To be considered HIGH RISK for Coronavirus (COVID-19), you have to meet the following criteria:  . Traveled to [place], [place], [place], [place] or [place]; or in the [place] to [place], [place], [place], or [place]; and have fever, cough, and shortness of breath within the last 2 weeks of travel OR . Been in close contact with a person diagnosed with COVID-19 within the last 2 weeks and have fever, cough, and shortness of breath . IF YOU DO NOT MEET THESE CRITERIA, YOU ARE CONSIDERED LOW RISK FOR COVID-19.  What to do if you are HIGH RISK for COVID-19?  . If you are having a medical emergency, call 911. . Seek medical care right away. Before you go to a doctor's office, urgent care or emergency department, call ahead and tell them about your recent travel, contact with someone diagnosed with COVID-19, and your symptoms. You should receive instructions from your physician's office regarding next steps of care.  . When you arrive at healthcare provider, tell the healthcare staff immediately you have returned from visiting [place], [place], [place], [place] or [place]; or traveled in the [place] to [place], [place], [place], or [place]; in the last two weeks or you have been in close contact with a person diagnosed with COVID-19 in the last 2 weeks.   . Tell the health care staff about your symptoms: fever, cough and shortness of breath. . After you have been seen by a medical provider, you will be either: o Tested for (COVID-19) and discharged home on quarantine except to seek medical care if symptoms worsen, and asked to  - Stay home and avoid contact with others until you get your results (4-5 days)  - Avoid travel on public transportation if possible (such as bus, train, or airplane) or o Sent to the Emergency Department by EMS for evaluation, COVID-19 testing, and possible  admission depending on your condition and test results.  What to do if you are LOW RISK for COVID-19?  Reduce your risk of any infection by using the same precautions used for avoiding the common cold or flu:  . Wash your hands often with soap and warm water for at least 20 seconds.  If soap and water are not readily available, use an alcohol-based hand sanitizer with at least 60% alcohol.  . If coughing or sneezing, cover your mouth and nose by coughing or sneezing into the elbow areas of your shirt or coat, into a tissue or into your sleeve (not your hands). . Avoid shaking hands with others and consider head nods or verbal greetings only. . Avoid touching your eyes, nose, or mouth with unwashed hands.  . Avoid close contact with people who are sick. . Avoid places or events with large numbers of people in one location, like concerts or sporting events. . Carefully consider travel plans you have or are making. . If you are planning any travel outside or inside the US, visit the CDC's Travelers' Health webpage for the latest health notices. . If you have some symptoms but not all symptoms, continue to monitor at home and seek medical attention if your symptoms worsen. . If you are having a medical emergency, call 911.   ADDITIONAL HEALTHCARE OPTIONS FOR PATIENTS  [facility] / e-Visit: [email or web address]         [facility]: [phone]  [facility]: [phone]                   [facility]: [phone]   

## 2018-09-24 NOTE — Progress Notes (Signed)
Radiation Oncology         [phone] ________________________________  Name: Valerie Kerr MRN: [id number]  Date: 09/24/2018  DOB: [birth year]  Follow-Up Visit Note  CC: Charlott Rakes, MD  [name], MD    ICD-10-CM   1. 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   Pap smear from her last visit here on 12/28/2017 was ASCUS positive for high risk HPV. She saw Dr. [name] on 01/09/2018, who performed colposcopy. Pathology from the procedure was benign with inflammation but no dysplasia or malignancy.  Of note, she also presented to the ED in 04/2018 with a rash and was treated for shingles.  On review of systems, she reports pain to bilateral legs rated 9/10 and slight constipation that is relieved by OTC medications. She denies dysuria or hematuria, vaginal bleeding or discharge, rectal bleeding, abdominal bloating, and nausea or vomiting.   ALLERGIES:  is allergic to codeine; fentanyl; hydrocodone-acetaminophen; and tramadol.  Meds: Current Outpatient Medications  Medication Sig Dispense Refill  . ALPRAZolam (XANAX) 0.25 MG tablet Take 0.5 tablets (0.125 mg total) by mouth 2 (two) times daily as needed for anxiety. 30 tablet 0  . busPIRone (BUSPAR) 15 MG tablet Take 1 tablet (15 mg total) by mouth 2 (two) times daily. 60 tablet 0  . cetirizine (ZYRTEC) 10 MG tablet Take 1 tablet (10 mg total) by mouth daily. 30 tablet 2  . fluticasone (FLONASE) 50 MCG/ACT nasal spray Place 1 spray into both nostrils daily. 16 g 2  . gabapentin (NEURONTIN) 300 MG capsule Take 2 capsules (600 mg  total) by mouth 2 (two) times daily. 120 capsule 1  . HYDROmorphone (DILAUDID) 4 MG tablet Take 1 tablet (4 mg total) by mouth every 6 (six) hours as needed for severe pain. 60 tablet 0  . ibuprofen (ADVIL,MOTRIN) 600 MG tablet Take 1 tablet (600 mg total) by mouth every 6 (six) hours as needed. 20 tablet 0  . lidocaine (LIDODERM) 5 % Place 1 patch onto the skin daily. Remove & Discard patch within 12 hours or as directed by MD 30 patch 0  . lisinopril-hydrochlorothiazide (PRINZIDE,ZESTORETIC) 20-25 MG tablet Take 1 tablet by mouth daily. MUST MAKE APPT FOR FURTHER REFILLS 30 tablet 0  . methadone (DOLOPHINE) 10 MG/5ML solution Take 50 mg by mouth daily.     . mirtazapine (REMERON) 15 MG tablet Take 1 tablet (15 mg total) by mouth at bedtime. 30 tablet 3  . omeprazole (PRILOSEC) 20 MG capsule Take 1 capsule (20 mg total) by mouth daily. 30 capsule 3  . prochlorperazine (COMPAZINE) 10 MG tablet Take 1 tablet (10 mg total) by mouth every 6 (six) hours as needed for nausea. 30 tablet 0  . Vitamin D, Ergocalciferol, (DRISDOL) 50000 units CAPS capsule Take 1 capsule (50,000 Units total) by mouth every 7 (seven) days. (Patient taking differently: Take 50,000 Units by mouth every Saturday. ) 16 capsule 0  . VOLTAREN 1 % GEL Apply 4 g topically 4 (four) times daily. 100 g 0  . sulfamethoxazole-trimethoprim (BACTRIM DS,SEPTRA DS) 800-160 MG tablet Take 1 tablet by mouth 2 (two)  times daily. (Patient not taking: Reported on 09/24/2018) 14 tablet 0   No current facility-administered medications for this encounter.     Physical Findings: The patient is in no acute distress. Patient is alert and oriented.  height is 5\' 1"  (1.549 m) and weight is 175 lb 9.6 oz (79.7 kg). Her oral temperature is 99.3 F (37.4 C). Her blood pressure is 140/60 and her pulse is 74. Her respiration is 20 and oxygen saturation is 99%.   Lungs are clear to auscultation bilaterally. Heart has regular rate and rhythm. No palpable  cervical, supraclavicular, or axillary adenopathy. Abdomen soft, non-tender, normal bowel sounds.  On pelvic examination the external genitalia were unremarkable. A speculum exam was performed. There are no mucosal lesions noted in the vaginal vault. Slight erythema noted to the posterior lip of the cervix.  The cervix is flush with the upper vaginal area. A . On bimanual and rectovaginal examination the cervix noted to be normal in size. No parametrial extension. Rectal sphincter tone good.   Lab Findings: Lab Results  Component Value Date   WBC 6.9 11/25/2017   HGB 13.4 11/25/2017   HCT 41.0 11/25/2017   MCV 89.1 11/25/2017   PLT 311 11/25/2017    Radiographic Findings: No results found.  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  Patient is temporarily living in the [facility] region area to avoid potential exposure.  She reports that her brother who is living in the [place] area died recently of COVID-19 infection.  She did not visit with him prior to his death.   Plan:  Routine follow-up in radiation oncology in 6 months. Patient will follow up with Dr. [name] in 3 months.  She will be due for a Pap smear in the fall.  The patient's Dilaudid pain medication was reviewed.  She has chronic inoperable back pain.   -----------------------------------  [name], PhD, MD  This document serves as a record of services personally performed by [name], MD. It was created on his behalf by Wilburn Mylar, a trained medical scribe. The creation of this record is based on the scribe's personal observations and the provider's statements to them. This document has been checked and approved by the attending provider.

## 2018-09-24 NOTE — Progress Notes (Signed)
Pt presents today for f/u with Dr. Sondra Come. Pt needs multiple refills. Pt reports pain in bilateral legs, rated 9/10. Pt denies dysuria/hematuria. Pt denies vaginal bleeding/discharge. Pt denies rectal bleeding. Pt reports slight constipation that is relieved by OTC medications. Pt denies abdominal bloating, N/V.   BP 140/60 (BP Location: Left Arm, Patient Position: Sitting)   Pulse 74   Temp 99.3 F (37.4 C) (Oral)   Resp 20   Ht 5\' 1"  (1.549 m)   Wt 175 lb 9.6 oz (79.7 kg)   SpO2 99%   BMI 33.18 kg/m   Wt Readings from Last 3 Encounters:  09/24/18 175 lb 9.6 oz (79.7 kg)  04/26/18 155 lb (70.3 kg)  01/09/18 149 lb 12.8 oz (67.9 kg)   Loma Sousa, RN BSN

## 2018-09-28 ENCOUNTER — Other Ambulatory Visit: Payer: Self-pay | Admitting: Internal Medicine

## 2018-09-28 DIAGNOSIS — I10 Essential (primary) hypertension: Secondary | ICD-10-CM

## 2018-10-01 ENCOUNTER — Telehealth: Payer: Self-pay | Admitting: Family Medicine

## 2018-10-01 NOTE — Telephone Encounter (Signed)
1) Medication(s) Requested (by name): lisinopril 2) [facility] [place] 3) Special Requests:   Approved medications will be sent to the pharmacy, we will reach out if there is an issue.  Requests made after 3pm may not be addressed until the following business day!  If a patient is unsure of the name of the medication(s) please note and ask patient to call back when they are able to provide all info, do not send to responsible party until all information is available!

## 2018-10-01 NOTE — Telephone Encounter (Signed)
Pt name and DOB verified. Pt states that the pharmacy gave her 3 Lisinopril to take which helped to  alleviate her headache. Asked patient if she has tried OTC Tylenol, Ibuprofen or caffeine. She states Tylenol helped headache a little bit.   Advised to to go to ED if headache worsens, numbness or tingling, slurred speech, dizziness, blurred vision onset. Pt reminded that her appointment is in the morning, 10/02/2018 at 0850.  Pt verbalized understanding.

## 2018-10-01 NOTE — Telephone Encounter (Signed)
Patient called stating she has been having a bad headache due to not taking her medication and is concerned. Please follow up.

## 2018-10-02 ENCOUNTER — Encounter: Payer: Self-pay | Admitting: Family Medicine

## 2018-10-02 ENCOUNTER — Other Ambulatory Visit: Payer: Self-pay

## 2018-10-02 ENCOUNTER — Ambulatory Visit: Payer: Medicare Other | Attending: Family Medicine | Admitting: Family Medicine

## 2018-10-02 DIAGNOSIS — F321 Major depressive disorder, single episode, moderate: Secondary | ICD-10-CM

## 2018-10-02 DIAGNOSIS — I10 Essential (primary) hypertension: Secondary | ICD-10-CM | POA: Diagnosis not present

## 2018-10-02 DIAGNOSIS — J328 Other chronic sinusitis: Secondary | ICD-10-CM | POA: Diagnosis not present

## 2018-10-02 DIAGNOSIS — G4709 Other insomnia: Secondary | ICD-10-CM | POA: Diagnosis not present

## 2018-10-02 DIAGNOSIS — M5126 Other intervertebral disc displacement, lumbar region: Secondary | ICD-10-CM

## 2018-10-02 MED ORDER — LISINOPRIL-HYDROCHLOROTHIAZIDE 20-25 MG PO TABS: 1.0000 | ORAL_TABLET | Freq: Every day | ORAL | 0 refills | Status: DC

## 2018-10-02 MED ORDER — FLUTICASONE PROPIONATE 50 MCG/ACT NA SUSP: 1.0000 | Freq: Every day | NASAL | 2 refills | Status: DC

## 2018-10-02 MED ORDER — MIRTAZAPINE 15 MG PO TABS: 15.0000 mg | ORAL_TABLET | Freq: Every day | ORAL | 3 refills | Status: DC

## 2018-10-02 MED ORDER — CETIRIZINE HCL 10 MG PO TABS: 10.0000 mg | ORAL_TABLET | Freq: Every day | ORAL | 2 refills | Status: DC

## 2018-10-02 NOTE — Telephone Encounter (Signed)
rx has already been sent

## 2018-10-02 NOTE — Progress Notes (Signed)
Patient has been called and DOB has been verified. Patient has been screened and transferred to PCP to start phone visit.     

## 2018-10-02 NOTE — Progress Notes (Signed)
Virtual Visit via Telephone Note  I connected with Valerie Kerr, on 10/02/2018 at 9:02 AM by telephone due to the COVID-19 pandemic and verified that I am speaking with the correct person using two identifiers.   Consent: I discussed the limitations, risks, security and privacy concerns of performing an evaluation and management service by telephone and the availability of in person appointments. I also discussed with the patient that there may be a patient responsible charge related to this service. The patient expressed understanding and agreed to proceed.   Location of Patient: In [place]  Location of Provider: Clinic   Persons participating in Telemedicine visit: Jamaira Sherk [place]-CMA Dr. Felecia Shelling     History of Present Illness: Valerie Kerr is a 58 year old female with a history of hypertension, gastroesophageal reflux disease, infiltrative squamous cell carcinoma of the cervical cancer (status post chemoradiation which she completed in 05/2016), lumbar stenosis here for follow-up visit.  Her last visit to the clinic was in 09/2017 and she informs me she relocated to [place] but came down to her radiation oncology appointment recently.  Yet to establish with a primary care physician there but does have a spine specialist there.  She had a visit with radiation oncology last month, no evidence of recurrent disease per notes and a six-month follow-up recommended; she has a follow-up with GYN oncology in 3 months.  She has been out of her antihypertensive and noticed headaches.  She is requesting refills of her chronic sinus medications and also Remeron which she takes for insomnia as she has been out of her medications. With regards to her low back pain she is on Dilaudid which she receives from her radiation oncologist, Dr. Luretha Rued and also attends a methadone clinic.  She is being worked up by her current orthopedic for possible epidural spinal injections.  Her  back pain gets severe to the point where she is barely able to move and the pain radiates down both lower extremities right greater than left.  She denies recent falls or loss of sphincteric function. She recently lost her brother in [place] due to the COVID-19 and she was unable to visit with him.  Past Medical History:  Diagnosis Date  . Anxiety   . Cervical cancer [facility]) oncologist-  dr gorsuch/ dr Sondra Come   02-20-2016 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---  treatment concurrent chemo (week 6 on hold due to pancytopenia) and radiation therapy (external beam 04-12-2016 to 05-31-2016)  started high-dose rate brachytherapy 05-26-2016  . Chemotherapy-induced thrombocytopenia   . Chronic pain disorder    back,neck-- chronic methadone  . Environmental allergies    allergy to dust mites  . GERD (gastroesophageal reflux disease)   . Headache   . Herniated disc, cervical   . History of external beam radiation therapy 04/12/16-05/31/16   pelvis 45 Gy in 25 fractions, in  30 sessions, pelvis boost 9 Gy in 5 fractions  . History of radiation therapy 06/20/16-07/04/16   tandem and ring applicator to cervix 28 Gy in 5 fractions  . Hypertension   . Hypokalemia   . Hypomagnesemia    po supplement and IV replacement  . Leukopenia due to antineoplastic chemotherapy ([place])   . Lumbar herniated disc   . 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 Periodontitis    Allergies  Allergen Reactions  . Codeine Itching and Rash  . Fentanyl Rash    Skin rash, local swelling from patch  . Hydrocodone-Acetaminophen Rash  .  Tramadol Other (See Comments)    GI upset,also trembling sensation    Current Outpatient Medications on File Prior to Visit  Medication Sig Dispense Refill  . ALPRAZolam (XANAX) 0.25 MG tablet Take 0.5 tablets (0.125 mg total) by mouth 2 (two) times daily as needed for anxiety. 30 tablet 0  . cetirizine (ZYRTEC) 10 MG tablet Take 1 tablet (10 mg total) by mouth  daily. 30 tablet 2  . fluticasone (FLONASE) 50 MCG/ACT nasal spray Place 1 spray into both nostrils daily. 16 g 2  . gabapentin (NEURONTIN) 300 MG capsule Take 2 capsules (600 mg total) by mouth 2 (two) times daily. 120 capsule 1  . HYDROmorphone (DILAUDID) 4 MG tablet Take 1 tablet (4 mg total) by mouth every 6 (six) hours as needed for severe pain. 60 tablet 0  . ibuprofen (ADVIL,MOTRIN) 600 MG tablet Take 1 tablet (600 mg total) by mouth every 6 (six) hours as needed. 20 tablet 0  . lidocaine (LIDODERM) 5 % Place 1 patch onto the skin daily. Remove & Discard patch within 12 hours or as directed by MD 30 patch 0  . lisinopril-hydrochlorothiazide (PRINZIDE,ZESTORETIC) 20-25 MG tablet Take 1 tablet by mouth daily. MUST MAKE APPT FOR FURTHER REFILLS 30 tablet 0  . methadone (DOLOPHINE) 10 MG/5ML solution Take 50 mg by mouth daily.     . mirtazapine (REMERON) 15 MG tablet Take 1 tablet (15 mg total) by mouth at bedtime. 30 tablet 3  . omeprazole (PRILOSEC) 20 MG capsule Take 1 capsule (20 mg total) by mouth daily. 30 capsule 3  . prochlorperazine (COMPAZINE) 10 MG tablet Take 1 tablet (10 mg total) by mouth every 6 (six) hours as needed for nausea. 30 tablet 0  . Vitamin D, Ergocalciferol, (DRISDOL) 50000 units CAPS capsule Take 1 capsule (50,000 Units total) by mouth every 7 (seven) days. (Patient taking differently: Take 50,000 Units by mouth every Saturday. ) 16 capsule 0  . VOLTAREN 1 % GEL Apply 4 g topically 4 (four) times daily. 100 g 0  . busPIRone (BUSPAR) 15 MG tablet Take 1 tablet (15 mg total) by mouth 2 (two) times daily. (Patient not taking: Reported on 10/02/2018) 60 tablet 0  . sulfamethoxazole-trimethoprim (BACTRIM DS,SEPTRA DS) 800-160 MG tablet Take 1 tablet by mouth 2 (two) times daily. (Patient not taking: Reported on 09/24/2018) 14 tablet 0   No current facility-administered medications on file prior to visit.     Observations/Objective: Alert, awake, oriented x3 Not in acute  distress   CMP Latest Ref Rng & Units 11/25/2017 10/22/2017 10/17/2017  Glucose 70 - 99 mg/dL 91 111(H) 92  BUN 6 - 20 mg/dL 14 17 17   Creatinine 0.44 - 1.00 mg/dL 1.04(H) 1.11(H) 0.75  Sodium 135 - 145 mmol/L 141 141 141  Potassium 3.5 - 5.1 mmol/L 3.2(L) 3.5 3.3(L)  Chloride 98 - 111 mmol/L 96(L) 93(L) 98  CO2 22 - 32 mmol/L 32 37(H) 29  Calcium 8.9 - 10.3 mg/dL 9.8 9.7 9.2  Total Protein 6.5 - 8.1 g/dL - - 8.2(H)  Total Bilirubin 0.3 - 1.2 mg/dL - - 0.5  Alkaline Phos 38 - 126 U/L - - 91  AST 15 - 41 U/L - - 28  ALT 0 - 44 U/L - - 30     Assessment and Plan: 1. Essential hypertension She is due for labs as last labs were from 10/2017 and revealed hypokalemia of 3.2 -She will need to establish with a new PCP so labs can be obtained since  she has relocated to [place] - lisinopril-hydrochlorothiazide (ZESTORETIC) 20-25 MG tablet; Take 1 tablet by mouth daily.  Dispense: 90 tablet; Refill: 0  2. Current moderate episode of major depressive disorder without prior episode ([place]) Stable She does have Xanax from her Radiation Oncologist which she uses for anxiety as needed On Remeron for insomnia - mirtazapine (REMERON) 15 MG tablet; Take 1 tablet (15 mg total) by mouth at bedtime.  Dispense: 30 tablet; Refill: 3  3. Other insomnia Stable - mirtazapine (REMERON) 15 MG tablet; Take 1 tablet (15 mg total) by mouth at bedtime.  Dispense: 30 tablet; Refill: 3  4. Chronic  sinusitis Stable - cetirizine (ZYRTEC) 10 MG tablet; Take 1 tablet (10 mg total) by mouth daily.  Dispense: 30 tablet; Refill: 2 - fluticasone (FLONASE) 50 MCG/ACT nasal spray; Place 1 spray into both nostrils daily.  Dispense: 16 g; Refill: 2  5. Lumbar herniated disc Stable Under the care of Orthopedics in [place]   Follow Up Instructions: Advised it is best to establish care with a primary care physician since she has relocated to [place]   I discussed the assessment and treatment plan with the patient. The  patient was provided an opportunity to ask questions and all were answered. The patient agreed with the plan and demonstrated an understanding of the instructions.   The patient was advised to call back or seek an in-person evaluation if the symptoms worsen or if the condition fails to improve as anticipated.     I provided 21 minutes total of non-face-to-face time during this encounter including median intraservice time, reviewing previous notes, labs, imaging, medications, management and patient verbalized understanding.     Charlott Rakes, MD, FAAFP. [facility] and [place], [place]   10/02/2018, 9:02 AM

## 2018-10-03 ENCOUNTER — Encounter

## 2018-10-24 ENCOUNTER — Other Ambulatory Visit: Payer: Self-pay | Admitting: Family Medicine

## 2018-10-24 DIAGNOSIS — F321 Major depressive disorder, single episode, moderate: Secondary | ICD-10-CM

## 2018-10-24 DIAGNOSIS — G4709 Other insomnia: Secondary | ICD-10-CM

## 2018-10-30 ENCOUNTER — Other Ambulatory Visit: Payer: Self-pay | Admitting: Radiation Oncology

## 2018-10-30 DIAGNOSIS — F418 Other specified anxiety disorders: Secondary | ICD-10-CM

## 2018-10-30 DIAGNOSIS — C531 Malignant neoplasm of exocervix: Secondary | ICD-10-CM

## 2018-10-30 MED ORDER — ALPRAZOLAM 0.25 MG PO TABS: 0.1250 mg | ORAL_TABLET | Freq: Two times a day (BID) | ORAL | 0 refills | Status: DC | PRN

## 2018-10-30 MED ORDER — HYDROMORPHONE HCL 4 MG PO TABS: 4.0000 mg | ORAL_TABLET | Freq: Four times a day (QID) | ORAL | 0 refills | Status: DC | PRN

## 2018-11-02 ENCOUNTER — Other Ambulatory Visit: Payer: Self-pay | Admitting: Radiation Oncology

## 2018-11-02 DIAGNOSIS — C531 Malignant neoplasm of exocervix: Secondary | ICD-10-CM

## 2018-11-02 DIAGNOSIS — F418 Other specified anxiety disorders: Secondary | ICD-10-CM

## 2018-11-02 MED ORDER — HYDROMORPHONE HCL 4 MG PO TABS: 4.0000 mg | ORAL_TABLET | Freq: Four times a day (QID) | ORAL | 0 refills | Status: DC | PRN

## 2018-11-02 MED ORDER — ALPRAZOLAM 0.25 MG PO TABS: 0.1250 mg | ORAL_TABLET | Freq: Two times a day (BID) | ORAL | 0 refills | Status: DC | PRN

## 2018-11-13 ENCOUNTER — Telehealth: Payer: Self-pay

## 2018-11-13 DIAGNOSIS — I10 Essential (primary) hypertension: Secondary | ICD-10-CM

## 2018-11-13 NOTE — Telephone Encounter (Signed)
Patient states that she can not find a doctor in [place] due to [place] and offices are not taking new patients at this time. Patient wants lab orders faxed to a [facility] in [place]

## 2018-11-13 NOTE — Telephone Encounter (Signed)
Pt request ORDERS for blood work to be faxed to out of state location. Phone for [facility] in [place] [phone]. Pt ph [phone]. Pt also states she does not do any appointments on Saturday's due to religious beliefs.

## 2018-11-15 NOTE — Telephone Encounter (Signed)
Orders placed. Can you please fax to requested lab? Thanks.

## 2018-11-16 NOTE — Telephone Encounter (Signed)
Orders has been faxed over to requested number.

## 2018-11-22 ENCOUNTER — Telehealth: Payer: Self-pay | Admitting: Family Medicine

## 2018-11-22 NOTE — Telephone Encounter (Signed)
New Message   Pt is calling to request a referral for a doctor in [place]. Please f/u

## 2018-12-06 ENCOUNTER — Other Ambulatory Visit: Payer: Self-pay | Admitting: Radiation Oncology

## 2018-12-06 DIAGNOSIS — F418 Other specified anxiety disorders: Secondary | ICD-10-CM

## 2018-12-06 DIAGNOSIS — C531 Malignant neoplasm of exocervix: Secondary | ICD-10-CM

## 2018-12-06 MED ORDER — HYDROMORPHONE HCL 4 MG PO TABS: 4.0000 mg | ORAL_TABLET | Freq: Four times a day (QID) | ORAL | 0 refills | Status: DC | PRN

## 2018-12-06 MED ORDER — ALPRAZOLAM 0.25 MG PO TABS: 0.1250 mg | ORAL_TABLET | Freq: Two times a day (BID) | ORAL | 0 refills | Status: DC | PRN

## 2018-12-28 ENCOUNTER — Other Ambulatory Visit: Payer: Self-pay | Admitting: Family Medicine

## 2018-12-28 DIAGNOSIS — I10 Essential (primary) hypertension: Secondary | ICD-10-CM

## 2018-12-31 ENCOUNTER — Telehealth: Payer: Self-pay | Admitting: Family Medicine

## 2018-12-31 DIAGNOSIS — K219 Gastro-esophageal reflux disease without esophagitis: Secondary | ICD-10-CM

## 2018-12-31 DIAGNOSIS — I10 Essential (primary) hypertension: Secondary | ICD-10-CM

## 2018-12-31 MED ORDER — LISINOPRIL-HYDROCHLOROTHIAZIDE 20-25 MG PO TABS: 1.0000 | ORAL_TABLET | Freq: Every day | ORAL | 0 refills | Status: DC

## 2018-12-31 MED ORDER — OMEPRAZOLE 20 MG PO CPDR: 20.0000 mg | DELAYED_RELEASE_CAPSULE | Freq: Every day | ORAL | 0 refills | Status: DC

## 2018-12-31 NOTE — Telephone Encounter (Signed)
Will send enough to cover for 1 month. She is due labs per last PCP note.

## 2018-12-31 NOTE — Telephone Encounter (Signed)
1) Medication(s) Requested (by name): -lisinopril-hydrochlorothiazide (ZESTORETIC) 20-25 MG tablet  -omeprazole (PRILOSEC) 20 MG capsule   2) [id number] [place], [place] - [address]  Pt is establishing care on 01/02/2019 with new pcp in [place], would like one last refill, please follow up If possible

## 2019-01-02 DIAGNOSIS — M5030 Other cervical disc degeneration, unspecified cervical region: Secondary | ICD-10-CM | POA: Insufficient documentation

## 2019-01-14 ENCOUNTER — Other Ambulatory Visit: Payer: Self-pay | Admitting: Radiation Oncology

## 2019-01-14 ENCOUNTER — Telehealth: Payer: Self-pay | Admitting: *Deleted

## 2019-01-14 DIAGNOSIS — C531 Malignant neoplasm of exocervix: Secondary | ICD-10-CM

## 2019-01-14 MED ORDER — HYDROMORPHONE HCL 4 MG PO TABS: 4.0000 mg | ORAL_TABLET | Freq: Four times a day (QID) | ORAL | 0 refills | Status: DC | PRN

## 2019-01-14 NOTE — Telephone Encounter (Signed)
CALLED PATIENT TO INFORM OF FU WITH DR. KINARD ON 08-12-19 @ 3 PM, PATIENT AGREED TO DATE AND TIME

## 2019-01-15 ENCOUNTER — Other Ambulatory Visit: Payer: Self-pay | Admitting: Radiation Oncology

## 2019-01-15 DIAGNOSIS — C531 Malignant neoplasm of exocervix: Secondary | ICD-10-CM

## 2019-01-15 MED ORDER — PROCHLORPERAZINE MALEATE 10 MG PO TABS: 10.0000 mg | ORAL_TABLET | Freq: Four times a day (QID) | ORAL | 0 refills | Status: DC | PRN

## 2019-01-25 ENCOUNTER — Other Ambulatory Visit: Payer: Self-pay | Admitting: Family Medicine

## 2019-01-25 DIAGNOSIS — K219 Gastro-esophageal reflux disease without esophagitis: Secondary | ICD-10-CM

## 2019-02-08 ENCOUNTER — Inpatient Hospital Stay: Payer: Medicare Other | Admitting: Gynecologic Oncology

## 2019-02-19 ENCOUNTER — Other Ambulatory Visit: Payer: Self-pay

## 2019-02-19 ENCOUNTER — Inpatient Hospital Stay: Payer: Medicare Other | Attending: Gynecologic Oncology | Admitting: Gynecologic Oncology

## 2019-02-19 ENCOUNTER — Other Ambulatory Visit: Payer: Self-pay | Admitting: Radiation Oncology

## 2019-02-19 ENCOUNTER — Other Ambulatory Visit (HOSPITAL_COMMUNITY)
Admission: RE | Admit: 2019-02-19 | Discharge: 2019-02-19 | Disposition: A | Discharge status: Home / Self Care | Payer: Medicare Other | Source: Ambulatory Visit | Attending: Gynecologic Oncology | Admitting: Gynecologic Oncology

## 2019-02-19 ENCOUNTER — Encounter: Payer: Self-pay | Admitting: Oncology

## 2019-02-19 ENCOUNTER — Encounter: Payer: Self-pay | Admitting: Gynecologic Oncology

## 2019-02-19 VITALS — BP 137/64 | HR 86 | Temp 98.0°F | Resp 18 | Ht 61.0 in | Wt 177.6 lb

## 2019-02-19 DIAGNOSIS — Z923 Personal history of irradiation: Secondary | ICD-10-CM | POA: Insufficient documentation

## 2019-02-19 DIAGNOSIS — I10 Essential (primary) hypertension: Secondary | ICD-10-CM | POA: Diagnosis not present

## 2019-02-19 DIAGNOSIS — F419 Anxiety disorder, unspecified: Secondary | ICD-10-CM | POA: Insufficient documentation

## 2019-02-19 DIAGNOSIS — Z800 Family history of malignant neoplasm of digestive organs: Secondary | ICD-10-CM | POA: Insufficient documentation

## 2019-02-19 DIAGNOSIS — F17210 Nicotine dependence, cigarettes, uncomplicated: Secondary | ICD-10-CM | POA: Diagnosis not present

## 2019-02-19 DIAGNOSIS — R87820 Cervical low risk human papillomavirus (HPV) DNA test positive: Secondary | ICD-10-CM | POA: Diagnosis not present

## 2019-02-19 DIAGNOSIS — Z801 Family history of malignant neoplasm of trachea, bronchus and lung: Secondary | ICD-10-CM | POA: Diagnosis not present

## 2019-02-19 DIAGNOSIS — Z79899 Other long term (current) drug therapy: Secondary | ICD-10-CM | POA: Insufficient documentation

## 2019-02-19 DIAGNOSIS — Z791 Long term (current) use of non-steroidal anti-inflammatories (NSAID): Secondary | ICD-10-CM | POA: Diagnosis not present

## 2019-02-19 DIAGNOSIS — Z780 Asymptomatic menopausal state: Secondary | ICD-10-CM | POA: Insufficient documentation

## 2019-02-19 DIAGNOSIS — C530 Malignant neoplasm of endocervix: Secondary | ICD-10-CM | POA: Diagnosis present

## 2019-02-19 DIAGNOSIS — Z9221 Personal history of antineoplastic chemotherapy: Secondary | ICD-10-CM | POA: Diagnosis not present

## 2019-02-19 DIAGNOSIS — Z1151 Encounter for screening for human papillomavirus (HPV): Secondary | ICD-10-CM | POA: Insufficient documentation

## 2019-02-19 DIAGNOSIS — C531 Malignant neoplasm of exocervix: Secondary | ICD-10-CM

## 2019-02-19 MED ORDER — HYDROMORPHONE HCL 4 MG PO TABS: 4.0000 mg | ORAL_TABLET | Freq: Four times a day (QID) | ORAL | 0 refills | Status: DC | PRN

## 2019-02-19 NOTE — Progress Notes (Signed)
Follow-up Note: Gyn-Onc  Consult was requested by Dr. [name] for the evaluation of Valerie Kerr 58 y.o. female  CC:  Chief Complaint  Patient presents with  . Malignant neoplasm of endocervix [facility])    Assessment/Plan:  Valerie Kerr  is a 58 y.o.  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  Post treatment PET showed excellent response at cervix.  HPV positive - recommend repeat pap with HPV in October, 2021.  I will see her back in November, 2021 to follow-up the cervical cancer surveillance.  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  The patient reports last having a pap smear approximately 4 years ago in [place] which, was followed by a biopsy which, per patient, was "normal" and she was not informed that she would require special followup. She began experiencing postmenopausal bleeding at the beginning of 2016 and continued to bleed intermittently for 2 years.   She presented to the ER in [place] in November (25th), 2017 and a cervical mass was identified on pelvic exam.  She was seen in the office by Dr Baron Sane 03/01/16 who performed cervical biopsies of a friable mass which revealed poorly differentiated squamous cell carcinoma. Attempts were made to notify her of this result on 03/03/16, however a phone call was left to call back and there appears to be delay in her receiving the information about her result.  She presented to the office to be seen by Dr [name] for discussion regarding results on 03/16/16 and was informed of the results and the need to see oncology.   A PET had been ordered however, the patient cancelled this due to claustrophobia and fear of having a scan and getting bad results.  On 04/04/16  she underwent pretreatment PET which showed 5.5 cm hypermetabolic cervical mass, consistent with known primary cervical carcinoma. No definite local or distant metastatic disease.Marland Kitchen  She was treated with primary radiation therapy and radiosensitizing chemotherapy with cisplatin between 04/12/16 and 06/20/16 with 45Gy to the pelvis with 9Gy boost, and 28 Gy additionally to the cervix with intracavitary brachytherapy.  Post treatment imaging on 06/29/16 showed excellent response at cervix but a new slightly enlarged PET avid right inguinal node was present.  Follow-up repeat PET on 10/20/16 showed complete resolution of PET avid findings at the cervix and inguinal node with no apparent malignant disease present.  On 12/28/17 she underwent surveillance exam with Dr Sondra Come. No recurrence was seen or felt, however screening pap showed ASCUS positive for high risk HPV. Colposcopy in October, 2019 was benign with inflammation but no dysplasia or malignancy identified.   Interval Hx:   She has chronic fatigue after radiation. No new symptoms of recurrence.   Current Meds:  Outpatient Encounter Medications as of 02/19/2019  Medication Sig  . ALPRAZolam (XANAX) 0.25 MG tablet Take 0.5 tablets (0.125 mg total) by mouth 2 (two) times daily as needed for anxiety.  . cetirizine (ZYRTEC) 10 MG tablet Take 1 tablet (10 mg total) by mouth daily.  . fluticasone (FLONASE) 50 MCG/ACT nasal spray Place 1 spray into both nostrils daily.  Marland Kitchen HYDROmorphone (DILAUDID) 4 MG tablet Take 1 tablet (4 mg total) by mouth every 6 (six) hours as needed for severe pain.  Marland Kitchen ibuprofen (ADVIL,MOTRIN) 600 MG tablet  Take 1 tablet (600 mg total) by mouth every 6 (six) hours as needed.  . lidocaine (LIDODERM) 5 % Place 1 patch onto the skin daily. Remove & Discard patch within 12 hours or as directed by MD  . lisinopril-hydrochlorothiazide (ZESTORETIC) 20-25 MG tablet Take 1 tablet by mouth daily.  . methadone (DOLOPHINE) 10 MG/5ML  solution Take 50 mg by mouth daily.   . mirtazapine (REMERON) 15 MG tablet Take 1 tablet (15 mg total) by mouth at bedtime.  Marland Kitchen omeprazole (PRILOSEC) 20 MG capsule TAKE 1 CAPSULE BY MOUTH EVERY DAY  . prochlorperazine (COMPAZINE) 10 MG tablet Take 1 tablet (10 mg total) by mouth every 6 (six) hours as needed for nausea.  . Vitamin D, Ergocalciferol, (DRISDOL) 50000 units CAPS capsule Take 1 capsule (50,000 Units total) by mouth every 7 (seven) days. (Patient taking differently: Take 50,000 Units by mouth every Saturday. )  . VOLTAREN 1 % GEL Apply 4 g topically 4 (four) times daily.  . [DISCONTINUED] sulfamethoxazole-trimethoprim (BACTRIM DS,SEPTRA DS) 800-160 MG tablet Take 1 tablet by mouth 2 (two) times daily. (Patient not taking: Reported on 09/24/2018)   No facility-administered encounter medications on file as of 02/19/2019.     Allergy:  Allergies  Allergen Reactions  . Codeine Itching and Rash  . Fentanyl Rash    Skin rash, local swelling from patch  . Hydrocodone-Acetaminophen Rash  . Tramadol Other (See Comments)    GI upset,also trembling sensation    Social Hx:   Social History   Socioeconomic History  . Marital status: Divorced    Spouse name: Not on file  . Number of children: 5  . Years of education: Not on file  . Highest education level: Not on file  Occupational History  . Occupation: disabled  Social Needs  . Financial resource strain: Not on file  . Food insecurity    Worry: Not on file    Inability: Not on file  . Transportation needs    Medical: No    Non-medical: No  Tobacco Use  . Smoking status: Current Every Day Smoker    Packs/day: 0.20    Years: 45.00    Pack years: 9.00    Types: Cigarettes  . Smokeless tobacco: Never Used  . Tobacco comment: 1 week since last cigarette  Substance and Sexual Activity  . Alcohol use: No  . Drug use: No  . Sexual activity: Not Currently  Lifestyle  . Physical activity    Days per week: Not on file     Minutes per session: Not on file  . Stress: Not on file  Relationships  . Social Herbalist on phone: Not on file    Gets together: Not on file    Attends religious service: Not on file    Active member of club or organization: Not on file    Attends meetings of clubs or organizations: Not on file    Relationship status: Not on file  . Intimate partner violence    Fear of current or ex partner: No    Emotionally abused: No    Physically abused: No    Forced sexual activity: No  Other Topics Concern  . Not on file  Social History Narrative   Lives with niece in a 2 story home.  Has 5 children.  On disability.  Formerly worked in Actuary at [employer].  Education: 11th grade.     Past Surgical Hx:  Past Surgical History:  Procedure  Laterality Date  . ABCESS DRAINAGE Left 1982   breast  . CARPAL TUNNEL RELEASE Bilateral   . CERVICAL DISC SURGERY  2010  . MULTIPLE EXTRACTIONS WITH ALVEOLOPLASTY N/A 04/08/2016   Procedure: Extraction of tooth #'s [id number] with alveoloplasty  and gross debridement of remaining teeth;  Surgeon: Lenn Cal, DDS;  Location: [facility];  Service: Oral Surgery;  Laterality: N/A;  . TANDEM RING INSERTION N/A 05/26/2016   Procedure: TANDEM RING INSERTION;  Surgeon: [name], MD;  Location[facility];  Service: Urology;  Laterality: N/A;  . TANDEM RING INSERTION N/A 06/02/2016   Procedure: TANDEM RING INSERTION;  Surgeon: [name], MD;  Location: [facility];  Service: Urology;  Laterality: N/A;  . TANDEM RING INSERTION N/A 06/20/2016   Procedure: TANDEM RING INSERTION;  Surgeon: [name], MD;  Location: [facility];  Service: Urology;  Laterality: N/A;  . TANDEM RING INSERTION N/A 06/23/2016   Procedure: TANDEM RING INSERTION;  Surgeon: [name], MD;  Location[facility];  Service: Urology;  Laterality: N/A;  . TANDEM RING INSERTION N/A 07/04/2016   Procedure:  TANDEM RING INSERTION;  Surgeon: [name], MD;  Location: [facility];  Service: Urology;  Laterality: N/A;    Past Medical Hx:  Past Medical History:  Diagnosis Date  . Anxiety   . Cervical cancer [facility]) oncologist-  dr gorsuch/ dr Sondra Come   02-20-2016 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---  treatment concurrent chemo (week 6 on hold due to pancytopenia) and radiation therapy (external beam 04-12-2016 to 05-31-2016)  started high-dose rate brachytherapy 05-26-2016  . Chemotherapy-induced thrombocytopenia   . Chronic pain disorder    back,neck-- chronic methadone  . Environmental allergies    allergy to dust mites  . GERD (gastroesophageal reflux disease)   . Headache   . Herniated disc, cervical   . History of external beam radiation therapy 04/12/16-05/31/16   pelvis 45 Gy in 25 fractions, in  30 sessions, pelvis boost 9 Gy in 5 fractions  . History of radiation therapy 06/20/16-07/04/16   tandem and ring applicator to cervix 28 Gy in 5 fractions  . Hypertension   . Hypokalemia   . Hypomagnesemia    po supplement and IV replacement  . Leukopenia due to antineoplastic chemotherapy ([place])   . Lumbar herniated disc   . Pancytopenia due to chemotherapy ([place])   . Periodontitis     Past Gynecological History:  Denies history of abnormal paps. Last pap was 4 years ago in [place] per patient. SVD x 5 No LMP recorded. Patient is postmenopausal.  Family Hx:  Family History  Problem Relation Age of Onset  . Cancer Father   . Cancer Sister        throat  . Cancer Brother        throat and stomach  . Cancer Brother        lung    Review of Systems:  Constitutional  Feels fatigue, sadness, generalized weakness, anxious.    ENT Normal appearing ears and nares bilaterally Skin/Breast  +sores, no jaundice, itching, dryness Cardiovascular  No chest pain, shortness of breath, or edema  Pulmonary  No cough or wheeze.  Gastro Intestinal  No  nausea, vomitting, or diarrhoea. No bright red blood per rectum, no abdominal pain, change in bowel movement.  Genito Urinary  No frequency, urgency, dysuria, . Musculo Skeletal  + myalgia, arthralgia, pain Neurologic  No weakness, numbness, change  in gait,  Psychology  No depression, anxiety, insomnia.   Vitals:  Blood pressure 137/64, pulse 86, temperature 98 F (36.7 C), temperature source Temporal, resp. rate 18, height 5\' 1"  (1.549 m), weight 177 lb 9.6 oz (80.6 kg), SpO2 100 %.  Physical Exam: WD in NAD Neck  Supple NROM, without any enlargements.  Lymph Node Survey No cervical supraclavicular or inguinal adenopathy Cardiovascular  Pulse normal rate, regularity and rhythm. S1 and S2 normal.  Lungs  Clear to auscultation bilateraly, without wheezes/crackles/rhonchi. Good air movement.  Skin  No rashes  Psychiatry  Alert and oriented to person, place, and time  Abdomen  Normoactive bowel sounds, abdomen soft, non-tender and nonobese without evidence of hernia.  Back No CVA tenderness Genito Urinary  Vulva/vagina: Normal external female genitalia.  No lesions. No discharge or bleeding.  Bladder/urethra:  No lesions or masses, well supported bladder  Vagina: grossly normal  Cervix: normal, atrophic, flush with vagina. Pap taken  Uterus:  Small, mobile,  Adnexa: no masses. Rectal  No lesions or masses Extremities  No bilateral cyanosis, clubbing or edema.    Thereasa Solo, MD  02/19/2019, 4:26 PM    Lunch

## 2019-02-19 NOTE — Progress Notes (Signed)
Valerie Kerr was given a brochure on cervical cancer with information on late side effects from radiation.

## 2019-02-19 NOTE — Patient Instructions (Addendum)
Please notify Dr [name] at phone number [phone] if you notice vaginal bleeding, new pelvic or abdominal pains, bloating, feeling full easy, or a change in bladder or bowel function.   Please return to see Dr [name] in November, 2021.

## 2019-02-21 ENCOUNTER — Other Ambulatory Visit: Payer: Self-pay | Admitting: Family Medicine

## 2019-02-21 DIAGNOSIS — K219 Gastro-esophageal reflux disease without esophagitis: Secondary | ICD-10-CM

## 2019-02-25 ENCOUNTER — Telehealth: Payer: Self-pay

## 2019-02-25 LAB — CYTOLOGY - PAP
Comment: NEGATIVE
Diagnosis: REACTIVE
High risk HPV: POSITIVE — AB

## 2019-02-25 NOTE — Telephone Encounter (Signed)
Told Ms Weiner that the Pap Smear shows radiation changes. Follow up as planned per [name], NP.

## 2019-02-28 NOTE — Telephone Encounter (Signed)
LM for Ms [place] stating that the radiation that she receive had the bladder in the field.  The radiation can change the odor of the urine. She can call back to [phone] if she has any further questions.

## 2019-03-01 ENCOUNTER — Other Ambulatory Visit: Payer: Self-pay | Admitting: Family Medicine

## 2019-03-01 DIAGNOSIS — J328 Other chronic sinusitis: Secondary | ICD-10-CM

## 2019-03-01 DIAGNOSIS — K219 Gastro-esophageal reflux disease without esophagitis: Secondary | ICD-10-CM

## 2019-03-05 ENCOUNTER — Other Ambulatory Visit: Payer: Self-pay | Admitting: Family Medicine

## 2019-03-05 DIAGNOSIS — K219 Gastro-esophageal reflux disease without esophagitis: Secondary | ICD-10-CM

## 2019-03-28 ENCOUNTER — Other Ambulatory Visit: Payer: Self-pay | Admitting: Radiation Oncology

## 2019-03-28 DIAGNOSIS — C531 Malignant neoplasm of exocervix: Secondary | ICD-10-CM

## 2019-03-28 MED ORDER — HYDROMORPHONE HCL 4 MG PO TABS: 4.0000 mg | ORAL_TABLET | Freq: Four times a day (QID) | ORAL | 0 refills | Status: DC | PRN

## 2019-04-08 ENCOUNTER — Other Ambulatory Visit: Payer: Self-pay | Admitting: Family Medicine

## 2019-04-08 DIAGNOSIS — J328 Other chronic sinusitis: Secondary | ICD-10-CM

## 2019-04-18 ENCOUNTER — Telehealth: Payer: Self-pay

## 2019-04-18 NOTE — Telephone Encounter (Signed)
Pt contacted this RN to convey that pt was in a methadone clinic program and would no longer be asking for dilaudid refills from Dr. Sondra Come. Conveyed to pt that this RN would make a note. Pt confirmed upcoming appt. Loma Sousa, RN BSN

## 2019-04-29 ENCOUNTER — Telehealth: Payer: Self-pay | Admitting: Gynecologic Oncology

## 2019-04-29 ENCOUNTER — Telehealth: Payer: Self-pay | Admitting: *Deleted

## 2019-04-29 NOTE — Telephone Encounter (Signed)
Returned call to [place] in regards to their request for provider to provider coordination of care. Left message with my contact information.

## 2019-04-29 NOTE — Telephone Encounter (Signed)
Received a call from [place] called for a provider to provider coordination of care. Patient of Dr [name] The call back number is [phone]. Message forwarded to [facility] APP

## 2019-05-01 ENCOUNTER — Other Ambulatory Visit: Payer: Self-pay | Admitting: Family Medicine

## 2019-05-01 DIAGNOSIS — K219 Gastro-esophageal reflux disease without esophagitis: Secondary | ICD-10-CM

## 2019-07-28 DIAGNOSIS — J301 Allergic rhinitis due to pollen: Secondary | ICD-10-CM | POA: Insufficient documentation

## 2019-08-08 ENCOUNTER — Telehealth: Payer: Self-pay | Admitting: *Deleted

## 2019-08-08 NOTE — Telephone Encounter (Signed)
RETURNED PATIENT'S PHONE CALL, PATIENT WANTS TO CANCEL FU ON 08-12-19, I ASKED ABOUT RESCHEDULING AND PATIENT STATED THAT SHE WOULD CALL AND DO SO @ A LATER DATE

## 2019-08-12 ENCOUNTER — Ambulatory Visit: Payer: Self-pay | Admitting: Radiation Oncology

## 2019-10-21 ENCOUNTER — Other Ambulatory Visit: Payer: Self-pay | Admitting: Radiation Oncology

## 2019-10-21 ENCOUNTER — Telehealth: Payer: Self-pay | Admitting: *Deleted

## 2019-10-21 DIAGNOSIS — F418 Other specified anxiety disorders: Secondary | ICD-10-CM

## 2019-10-21 MED ORDER — ALPRAZOLAM 0.25 MG PO TABS: 0.1250 mg | ORAL_TABLET | Freq: Two times a day (BID) | ORAL | 0 refills | Status: DC | PRN

## 2019-10-21 NOTE — Telephone Encounter (Signed)
Returned patient's phone call, spoke with patient 

## 2019-11-19 LAB — HEMOGLOBIN A1C: Hemoglobin A1C, External: 6.9 % — AB (ref 4.8–5.6)

## 2019-11-25 DIAGNOSIS — M51369 Other intervertebral disc degeneration, lumbar region without mention of lumbar back pain or lower extremity pain: Secondary | ICD-10-CM | POA: Insufficient documentation

## 2020-01-14 ENCOUNTER — Telehealth: Payer: Self-pay | Admitting: *Deleted

## 2020-01-14 ENCOUNTER — Other Ambulatory Visit: Payer: Self-pay | Admitting: Radiation Oncology

## 2020-01-14 DIAGNOSIS — F418 Other specified anxiety disorders: Secondary | ICD-10-CM

## 2020-01-14 MED ORDER — ALPRAZOLAM 0.25 MG PO TABS: 0.1250 mg | ORAL_TABLET | Freq: Two times a day (BID) | ORAL | 0 refills | Status: DC | PRN

## 2020-01-14 NOTE — Telephone Encounter (Signed)
RETURNED PATIENT'S PHONE CALL, LVM FOR A RETURN CALL 

## 2020-01-27 ENCOUNTER — Ambulatory Visit: Payer: Self-pay | Admitting: Radiation Oncology

## 2020-02-03 ENCOUNTER — Ambulatory Visit: Payer: Medicare Other | Admitting: Radiation Oncology

## 2020-02-10 ENCOUNTER — Other Ambulatory Visit (HOSPITAL_COMMUNITY)
Admission: RE | Admit: 2020-02-10 | Discharge: 2020-02-10 | Disposition: A | Discharge status: Home / Self Care | Payer: Medicare Other | Source: Ambulatory Visit | Attending: Radiation Oncology | Admitting: Radiation Oncology

## 2020-02-10 ENCOUNTER — Other Ambulatory Visit: Payer: Self-pay | Admitting: Radiation Oncology

## 2020-02-10 ENCOUNTER — Encounter: Payer: Self-pay | Admitting: Radiation Oncology

## 2020-02-10 ENCOUNTER — Ambulatory Visit
Admission: RE | Admit: 2020-02-10 | Discharge: 2020-02-10 | Disposition: A | Discharge status: Home / Self Care | Payer: Medicare Other | Source: Ambulatory Visit | Attending: Radiation Oncology | Admitting: Radiation Oncology

## 2020-02-10 ENCOUNTER — Other Ambulatory Visit: Payer: Self-pay

## 2020-02-10 VITALS — BP 123/83 | HR 102 | Temp 98.2°F | Resp 18 | Ht 61.5 in | Wt 180.2 lb

## 2020-02-10 DIAGNOSIS — C539 Malignant neoplasm of cervix uteri, unspecified: Secondary | ICD-10-CM | POA: Insufficient documentation

## 2020-02-10 DIAGNOSIS — R102 Pelvic and perineal pain: Secondary | ICD-10-CM | POA: Diagnosis not present

## 2020-02-10 DIAGNOSIS — Z8541 Personal history of malignant neoplasm of cervix uteri: Secondary | ICD-10-CM | POA: Diagnosis not present

## 2020-02-10 DIAGNOSIS — Z923 Personal history of irradiation: Secondary | ICD-10-CM | POA: Insufficient documentation

## 2020-02-10 DIAGNOSIS — R1900 Intra-abdominal and pelvic swelling, mass and lump, unspecified site: Secondary | ICD-10-CM | POA: Diagnosis not present

## 2020-02-10 DIAGNOSIS — Z79899 Other long term (current) drug therapy: Secondary | ICD-10-CM | POA: Insufficient documentation

## 2020-02-10 DIAGNOSIS — C530 Malignant neoplasm of endocervix: Secondary | ICD-10-CM

## 2020-02-10 DIAGNOSIS — R87810 Cervical high risk human papillomavirus (HPV) DNA test positive: Secondary | ICD-10-CM | POA: Diagnosis not present

## 2020-02-10 DIAGNOSIS — C531 Malignant neoplasm of exocervix: Secondary | ICD-10-CM

## 2020-02-10 DIAGNOSIS — Z1151 Encounter for screening for human papillomavirus (HPV): Secondary | ICD-10-CM | POA: Insufficient documentation

## 2020-02-10 LAB — CBC WITH DIFFERENTIAL (CANCER CENTER ONLY)
Abs Immature Granulocytes: 0.04 10*3/uL (ref 0.00–0.07)
Basophils Absolute: 0 10*3/uL (ref 0.0–0.1)
Basophils Relative: 0 %
Eosinophils Absolute: 0.1 10*3/uL (ref 0.0–0.5)
Eosinophils Relative: 1 %
HCT: 39.2 % (ref 36.0–46.0)
Hemoglobin: 12.3 g/dL (ref 12.0–15.0)
Immature Granulocytes: 1 %
Lymphocytes Relative: 23 %
Lymphs Abs: 1.8 10*3/uL (ref 0.7–4.0)
MCH: 28.1 pg (ref 26.0–34.0)
MCHC: 31.4 g/dL (ref 30.0–36.0)
MCV: 89.7 fL (ref 80.0–100.0)
Monocytes Absolute: 0.5 10*3/uL (ref 0.1–1.0)
Monocytes Relative: 6 %
Neutro Abs: 5.5 10*3/uL (ref 1.7–7.7)
Neutrophils Relative %: 69 %
Platelet Count: 265 10*3/uL (ref 150–400)
RBC: 4.37 MIL/uL (ref 3.87–5.11)
RDW: 13.2 % (ref 11.5–15.5)
WBC Count: 7.9 10*3/uL (ref 4.0–10.5)
nRBC: 0 % (ref 0.0–0.2)

## 2020-02-10 MED ORDER — HYDROMORPHONE HCL 4 MG PO TABS: 4.0000 mg | ORAL_TABLET | Freq: Four times a day (QID) | ORAL | 0 refills | Status: DC | PRN

## 2020-02-10 MED FILL — HYDROmorphone HCL 4 MG TABS: 4 | 7 days supply | Qty: 30 | Fill #0

## 2020-02-10 NOTE — Progress Notes (Signed)
Radiation Oncology         [phone] ________________________________  Name: Valerie Kerr MRN: [id number]  Date: 02/10/2020  DOB: 1961-01-16  Follow-Up Visit Note  CC: Charlott Rakes, MD  [name], MD    ICD-10-CM   1. Malignant neoplasm of endocervix ([place])  C53.0 CBC with Differential ([place])    [place] ([place] only)    CT Abdomen Pelvis W Contrast  2. Malignant neoplasm of cervix, unspecified site [facility])  C53.9 Cytology - PAP  3. Malignant neoplasm of exocervix ([facility])  C53.1 HYDROmorphone (DILAUDID) 4 MG tablet    Diagnosis:  Stage IIB poorly differentiated squamous cell carcinoma of the cervix   Interval Since Last Radiation:  Three years, seven months, and six days  04/12/2016 - 05/31/2016: 1) Pelvis/ 45 Gy in 25 fractions, 3D/15X 2) pelvic Boost/ 9 Gy in 5 fractions, Isodose Plan/15X  06/20/2016 - 07/04/2016: Cervix/ 28 Gy in 5 fractions, HDR Ir-192 (Tandem/Ring)   Narrative:  The patient returns today for follow-up. She was originally scheduled to be seen in May, but cancelled that appointment. She was last seen by Dr. [name] on 02/19/2019, during which time she was noted to have had an excellent response to therapy and remained under surveillance. PAP smear at that time was positive for high-risk HPV.  On review of systems, she reports worsening low back and pelvic pain. She denies vaginal bleeding or discharge..  ALLERGIES:  is allergic to codeine, fentanyl, hydrocodone-acetaminophen, and tramadol.  Meds: Current Outpatient Medications  Medication Sig Dispense Refill   ALPRAZolam (XANAX) 0.25 MG tablet Take 0.5 tablets (0.125 mg total) by mouth 2 (two) times daily as needed for anxiety. 30 tablet 0   cetirizine (ZYRTEC) 10 MG tablet TAKE 1 TABLET BY MOUTH EVERY DAY 90 tablet 0   fluticasone (FLONASE) 50 MCG/ACT nasal spray INSTILL 1 SPRAY INTO EACH NOSTRIL DAILY 48 mL 0   HYDROmorphone (DILAUDID) 4 MG tablet Take 1 tablet (4 mg total) by  mouth every 6 (six) hours as needed for severe pain. 30 tablet 0   lidocaine (LIDODERM) 5 % Place 1 patch onto the skin daily. Remove & Discard patch within 12 hours or as directed by MD 30 patch 0   lisinopril-hydrochlorothiazide (ZESTORETIC) 20-25 MG tablet Take 1 tablet by mouth daily. 30 tablet 0   methadone (DOLOPHINE) 10 MG/5ML solution Take 50 mg by mouth daily.      omeprazole (PRILOSEC) 20 MG capsule Take 1 capsule (20 mg total) by mouth daily. Must have office visit for refills 90 capsule 0   prochlorperazine (COMPAZINE) 10 MG tablet Take 1 tablet (10 mg total) by mouth every 6 (six) hours as needed for nausea. 30 tablet 0   Vitamin D, Ergocalciferol, (DRISDOL) 50000 units CAPS capsule Take 1 capsule (50,000 Units total) by mouth every 7 (seven) days. (Patient taking differently: Take 50,000 Units by mouth every Saturday. ) 16 capsule 0   VOLTAREN 1 % GEL Apply 4 g topically 4 (four) times daily. 100 g 0   No current facility-administered medications for this encounter.    Physical Findings: The patient is in no acute distress. Patient is alert and oriented.  height is 5' 1.5" (1.562 m) and weight is 180 lb 3.2 oz (81.7 kg). Her temperature is 98.2 F (36.8 C). Her blood pressure is 123/83 and her pulse is 102 (abnormal). Her respiration is 18 and oxygen saturation is 99%.  Lungs are clear to auscultation bilaterally. Heart has regular rate and rhythm.  No palpable cervical, supraclavicular, or axillary adenopathy. Abdomen soft, non-tender, normal bowel sounds.  On pelvic examination the external genitalia were unremarkable. A speculum exam was performed. There are no mucosal lesions noted in the vaginal vault.  The cervical os is flush with the upper vaginal region .  A Pap smear is obtained of the cervical region.  On bimanual and rectovaginal examination there is a possible mass in the left uterosacral region.  Palpation of this area is uncomfortable for the patient  Lab  Findings: Lab Results  Component Value Date   WBC 7.9 02/10/2020   HGB 12.3 02/10/2020   HCT 39.2 02/10/2020   MCV 89.7 02/10/2020   PLT 265 02/10/2020    Radiographic Findings: No results found.  Impression:  Stage IIB poorly differentiated squamous cell carcinoma of the cervix  Pelvic exam today is concerning for possible mass.  As above she has increasing pain.  Pap smear pending at this time  Plan: An urgent CT scan of the abdomen and pelvis was ordered today.  Patient lives in [place] but will remain in town for the rest of the week and hope to get the CT scan done in the next couple of days.  Blood work ordered today  Total time spent in this encounter was 35 minutes which included reviewing the patient's most recent follow-up with Dr. [name], PAP smear, physical examination, and documentation, and ordering of new scans  -----------------------------------   [name], PhD, MD  This document serves as a record of services personally performed by [name], MD. It was created on his behalf by Clerance Lav, a trained medical scribe. The creation of this record is based on the scribe's personal observations and the provider's statements to them. This document has been checked and approved by the attending provider.

## 2020-02-10 NOTE — Progress Notes (Signed)
Patient here for a f/u visit with Dr. Sondra Come. Patient had radiation  3 years, 7 months and 6 days ago. Patient reports severe pain in her lower back radiating down her legs and wants to restart Dilaudid for the pain. Pain today is a 10 out of 10. Patient denies vaginal bleeding and reports  some constipation. She does states she has pelvic pain when walking and moving from sitting to standing.  BP 123/83 (BP Location: Left Arm, Patient Position: Sitting, Cuff Size: Normal)   Pulse (!) 102   Temp 98.2 F (36.8 C)   Resp 18   Ht 5' 1.5" (1.562 m)   Wt 180 lb 3.2 oz (81.7 kg)   SpO2 99%   BMI 33.50 kg/m   Wt Readings from Last 3 Encounters:  02/10/20 180 lb 3.2 oz (81.7 kg)  02/19/19 177 lb 9.6 oz (80.6 kg)  09/24/18 175 lb 9.6 oz (79.7 kg)

## 2020-02-11 ENCOUNTER — Ambulatory Visit: Payer: Medicare Other

## 2020-02-11 ENCOUNTER — Ambulatory Visit
Admission: RE | Admit: 2020-02-11 | Discharge: 2020-02-11 | Disposition: A | Discharge status: Home / Self Care | Payer: Medicare Other | Source: Ambulatory Visit | Attending: Radiation Oncology | Admitting: Radiation Oncology

## 2020-02-11 ENCOUNTER — Other Ambulatory Visit: Payer: Self-pay

## 2020-02-11 DIAGNOSIS — Z8541 Personal history of malignant neoplasm of cervix uteri: Secondary | ICD-10-CM | POA: Diagnosis not present

## 2020-02-11 DIAGNOSIS — C539 Malignant neoplasm of cervix uteri, unspecified: Secondary | ICD-10-CM

## 2020-02-11 DIAGNOSIS — C530 Malignant neoplasm of endocervix: Secondary | ICD-10-CM

## 2020-02-11 LAB — CMP (CANCER CENTER ONLY)
ALT: 34 U/L (ref 0–44)
AST: 36 U/L (ref 15–41)
Albumin: 3.9 g/dL (ref 3.5–5.0)
Alkaline Phosphatase: 82 U/L (ref 38–126)
Anion gap: 9 (ref 5–15)
BUN: 18 mg/dL (ref 8–23)
CO2: 33 mmol/L — ABNORMAL HIGH (ref 22–32)
Calcium: 9.5 mg/dL (ref 8.9–10.3)
Chloride: 98 mmol/L (ref 98–111)
Creatinine: 0.99 mg/dL (ref 0.44–1.00)
GFR, Estimated: >60 mL/min (ref >60)
Glucose, Bld: 127 mg/dL — ABNORMAL HIGH (ref 70–99)
Potassium: 4.5 mmol/L (ref 3.5–5.1)
Sodium: 140 mmol/L (ref 135–145)
Total Bilirubin: 0.4 mg/dL (ref 0.3–1.2)
Total Protein: 8.3 g/dL — ABNORMAL HIGH (ref 6.5–8.1)

## 2020-02-12 ENCOUNTER — Ambulatory Visit (HOSPITAL_COMMUNITY): Payer: Medicare Other

## 2020-02-12 ENCOUNTER — Encounter (HOSPITAL_COMMUNITY): Payer: Self-pay

## 2020-02-12 ENCOUNTER — Telehealth: Payer: Self-pay | Admitting: *Deleted

## 2020-02-12 NOTE — Telephone Encounter (Signed)
Called patient to inform that test has been cancelled for today, due to insurance not pre-auth test, I told her it would be rescheduled once insurance pre-auth test, patient verified understanding this

## 2020-02-14 ENCOUNTER — Telehealth: Payer: Self-pay

## 2020-02-14 NOTE — Telephone Encounter (Signed)
Patient called concerned that she might be in trouble with the Methadone clinic as Dr. Sondra Come prescribed her Dialudid PO for pelvic pain when she was here at the clinic on 11/15. Patient advised that if there is a problem they can contact the radiation clinic and Dr. Sondra Come can write a note if necessary. Pt. Advised that her scan did not have a prior auth yet and she will be called when it is time to schedule it. Patient states she is going back to [place] this weekend.

## 2020-02-17 LAB — CYTOLOGY - PAP
Comment: NEGATIVE
Diagnosis: UNDETERMINED — AB
High risk HPV: POSITIVE — AB

## 2020-02-24 ENCOUNTER — Ambulatory Visit: Attending: Family | Primary: Internal Medicine

## 2020-02-24 ENCOUNTER — Inpatient Hospital Stay: Admit: 2020-02-24 | Payer: MEDICARE | Primary: Internal Medicine

## 2020-02-24 ENCOUNTER — Ambulatory Visit: Admit: 2020-02-24 | Discharge: 2020-02-24 | Payer: MEDICARE | Attending: Family | Primary: Internal Medicine

## 2020-02-24 DIAGNOSIS — R768 Other specified abnormal immunological findings in serum: Secondary | ICD-10-CM

## 2020-02-24 LAB — CBC WITH AUTO DIFFERENTIAL
Basophils %: 0 % (ref 0–2)
Basophils Absolute: 0 10*3/uL (ref 0.0–0.1)
Eosinophils %: 1 % (ref 0–5)
Eosinophils Absolute: 0 10*3/uL (ref 0.0–0.4)
Granulocyte Absolute Count: 0 10*3/uL (ref 0.00–0.04)
Hematocrit: 42.8 % (ref 35.0–45.0)
Hemoglobin: 12.8 g/dL (ref 12.0–16.0)
Immature Granulocytes: 1 % — ABNORMAL HIGH (ref 0.0–0.5)
Lymphocytes %: 18 % — ABNORMAL LOW (ref 21–52)
Lymphocytes Absolute: 1.4 10*3/uL (ref 0.9–3.6)
MCH: 27.5 PG (ref 24.0–34.0)
MCHC: 29.9 g/dL — ABNORMAL LOW (ref 31.0–37.0)
MCV: 92 FL (ref 78.0–100.0)
MPV: 10.2 FL (ref 9.2–11.8)
Monocytes %: 4 % (ref 3–10)
Monocytes Absolute: 0.3 10*3/uL (ref 0.05–1.2)
NRBC Absolute: 0 10*3/uL (ref 0.00–0.01)
Neutrophils %: 76 % — ABNORMAL HIGH (ref 40–73)
Neutrophils Absolute: 5.8 10*3/uL (ref 1.8–8.0)
Nucleated RBCs: 0 PER 100 WBC
Platelets: 313 10*3/uL (ref 135–420)
RBC: 4.65 M/uL (ref 4.20–5.30)
RDW: 13.2 % (ref 11.6–14.5)
WBC: 7.6 10*3/uL (ref 4.6–13.2)

## 2020-02-24 LAB — BASIC METABOLIC PANEL
Anion Gap: 8 mmol/L (ref 3.0–18)
BUN: 23 MG/DL — ABNORMAL HIGH (ref 7.0–18)
Bun/Cre Ratio: 23 — ABNORMAL HIGH (ref 12–20)
CO2: 29 mmol/L (ref 21–32)
Calcium: 9.5 MG/DL (ref 8.5–10.1)
Chloride: 98 mmol/L — ABNORMAL LOW (ref 100–111)
Creatinine: 1.01 MG/DL (ref 0.6–1.3)
EGFR IF NonAfrican American: 56 mL/min/{1.73_m2} — ABNORMAL LOW (ref >60)
GFR African American: >60 mL/min/{1.73_m2} (ref >60)
Glucose: 186 mg/dL — ABNORMAL HIGH (ref 74–99)
Potassium: 4.1 mmol/L (ref 3.5–5.5)
Sodium: 135 mmol/L — ABNORMAL LOW (ref 136–145)

## 2020-02-24 LAB — PROTIME-INR
INR: 1 (ref 0.8–1.2)
Protime: 13 s (ref 11.5–15.2)

## 2020-02-24 LAB — CBC WITH AUTOMATED DIFF
ABS. BASOPHILS: 0 10*3/uL (ref 0.0–0.1)
ABS. EOSINOPHILS: 0 10*3/uL (ref 0.0–0.4)
ABS. IMM. GRANS.: 0 10*3/uL (ref 0.00–0.04)
ABS. LYMPHOCYTES: 1.4 10*3/uL (ref 0.9–3.6)
ABS. MONOCYTES: 0.3 10*3/uL (ref 0.05–1.2)
ABS. NEUTROPHILS: 5.8 10*3/uL (ref 1.8–8.0)
ABSOLUTE NRBC: 0 10*3/uL (ref 0.00–0.01)
BASOPHILS: 0 % (ref 0–2)
EOSINOPHILS: 1 % (ref 0–5)
HCT: 42.8 % (ref 35.0–45.0)
HGB: 12.8 g/dL (ref 12.0–16.0)
IMMATURE GRANULOCYTES: 1 % — ABNORMAL HIGH (ref 0.0–0.5)
LYMPHOCYTES: 18 % — ABNORMAL LOW (ref 21–52)
MCH: 27.5 PG (ref 24.0–34.0)
MCHC: 29.9 g/dL — ABNORMAL LOW (ref 31.0–37.0)
MCV: 92 FL (ref 78.0–100.0)
MONOCYTES: 4 % (ref 3–10)
MPV: 10.2 FL (ref 9.2–11.8)
NEUTROPHILS: 76 % — ABNORMAL HIGH (ref 40–73)
NRBC: 0 PER 100 WBC
PLATELET: 313 10*3/uL (ref 135–420)
RBC: 4.65 M/uL (ref 4.20–5.30)
RDW: 13.2 % (ref 11.6–14.5)
WBC: 7.6 10*3/uL (ref 4.6–13.2)

## 2020-02-24 LAB — METABOLIC PANEL, BASIC
Anion gap: 8 mmol/L (ref 3.0–18)
BUN/Creatinine ratio: 23 — ABNORMAL HIGH (ref 12–20)
BUN: 23 MG/DL — ABNORMAL HIGH (ref 7.0–18)
CO2: 29 mmol/L (ref 21–32)
Calcium: 9.5 MG/DL (ref 8.5–10.1)
Chloride: 98 mmol/L — ABNORMAL LOW (ref 100–111)
Creatinine: 1.01 MG/DL (ref 0.6–1.3)
GFR est AA: >60 mL/min/{1.73_m2} (ref >60)
GFR est non-AA: 56 mL/min/{1.73_m2} — ABNORMAL LOW (ref >60)
Glucose: 186 mg/dL — ABNORMAL HIGH (ref 74–99)
Potassium: 4.1 mmol/L (ref 3.5–5.5)
Sodium: 135 mmol/L — ABNORMAL LOW (ref 136–145)

## 2020-02-24 LAB — PROTHROMBIN TIME + INR
INR: 1 (ref 0.8–1.2)
Prothrombin time: 13 s (ref 11.5–15.2)

## 2020-02-24 NOTE — Progress Notes (Signed)
[facility]  [facility]  [facility]      Bryson Ha, MD, FACP, [place], [place]    Roderic Scarce, MD, MPH      Jones Bales, PA-C    Zola Button, ACNP-BC     April S Ashworth, AGPCNP-BC   Mathis Fare, FNP-C    Maryelizabeth Rowan, AGPCNP-BC       [facility]    at [facility]    [address]    [place], [place]  [address]    [phone]    FAX: [phone]   [facility] [place] [facility]    at [facility]    [address], [address]    [place], [place]  [address]    [phone]    FAX: [phone]       Patient Care Team:  Ardelle Lesches, MD as PCP - General (Internal Medicine)  Antony Blackbird, MD (Radiation Oncology)      Problem List  Date Reviewed: Mar 04, 2020          Codes Class Noted    Hepatitis C antibody test positive ICD-10-CM: R76.8  ICD-9-CM: 795.79  2020/03/04        HTN (hypertension) ICD-10-CM: I10  ICD-9-CM: 401.9  03/04/2020        GERD (gastroesophageal reflux disease) ICD-10-CM: K21.9  ICD-9-CM: 530.81  03-04-2020        Neck pain ICD-10-CM: M54.2  ICD-9-CM: 723.1  12/31/2012        Encounter for long-term (current) use of other medications ICD-10-CM: Z79.899  ICD-9-CM: V58.69  12/31/2012        Chronic pain syndrome ICD-10-CM: G89.4  ICD-9-CM: 338.4  12/31/2012        GAD (generalized anxiety disorder) ICD-10-CM: F41.1  ICD-9-CM: 300.02  12/31/2012        DJD (degenerative joint disease), cervical ICD-10-CM: M47.812  ICD-9-CM: 721.0  12/31/2012        History of cervical spinal surgery ICD-10-CM: Z98.890  ICD-9-CM: V45.89  12/31/2012                The clinicians listed above have asked me to see Erica Zamora in consultation regarding chronic HCV and its management.      All medical records sent by the referring physicians were reviewed.     The patient is a 59 y.o. Black female who was screened for anti-HCV and tested positive during birth  cohort screening in 11/2019.      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        There was no history of acute icteric hepatitis at the time of these risk factors.      CT scan of the liver was performed in 07/2017.    The results of the imaging demonstrated a normal appearing liver.      An assessment of liver fibrosis with biopsy or elastography has not been performed.      The patient has never received treatment for chronic HCV.      The patient has no complaints which can be attributed to liver disease.    The moderate limitations in functional activities due to medical issues other than liver disease.      The patient has not experienced fatigue, fevers, chills, shortness of breath, chest pain, pain in  the right side over the liver, diffuse abdominal pain, nausea, vomiting, constipation, diarrhrea, dry eyes, dry mouth, arthralgias, myalgias, yellowing of the eyes or skin, itching, dark urine, problems concentrating, swelling of the abdomen, swelling of the lower extremities, hematemesis, or hematochezia.    ASSESSMENT AND PLAN:  Chronic HCV   Chronic HCV of unclear severity.      No labs currently available.     Based upon imaging the patient does not appear to have significant liver injury.    Will perform laboratory testing to monitor liver function and degree of liver injury.      Will perform and/or review results of HCV viral load and HCV genotype to define the specific treatment and duration of treatment that will be required.      Will perform serologic and virologic studies to assess for other causes of chronic liver disease.      Will perform imaging of the liver with ultrasound.      The need to perform an assessment of liver fibrosis was discussed with the patient.  The Fibroscan can assess liver fibrosis and determine if a patient has advanced fibrosis or cirrhosis without the need for liver biopsy.  This will be performed at the next  office visit.    If the Fibroscan suggests advanced fibrosis then a liver biopsy should be considered.    The Fibroscan can be repeated annually or as often as clinically indicated to assess for fibrosis progression and/or regression.    Chronic HCV Treatment  The patient has not been treated for HCV.    The patient has HCV genotype that is not yet defined.  Discussed the treatment alternatives.  The SVR/cure rate for HCV now exceeds 97% without significant side effects for most patients with HCV.  The specific treatment is dependent upon genotype, viral load and histology.    The patient should be treated with Harvoni (sofosbuvir and ledipasvir), Mavyret (glecaprevir and piprentasvir), or Epclusa (sofosbuvir and velpatasvir).     Screening for Hepatocellular Carcinoma  HCC screening: AFP was ordered today and ultrasound will be scheduled.    Treatment of other medical problems in patients with chronic liver disease  There are no contraindications for the patient to take most medications that are necessary for treatment of other medical issues.  The patient can take any medications utilized for treatment of DM and/or statins to treat hypercholesterolemia  Normal doses of acetaminophen, as recommended on the label of the bottle, are not hepatotoxic except in the setting of daily alcohol use, even in patients with cirrhosis and can be utilized for pain.    Counseling for alcohol in patients with chronic liver disease  The patient was counseled regarding alcohol consumption and the effect of alcohol on chronic liver disease.  The patient has cirrhosis and was advised to be abstinent from all alcohol including non-alcoholic beer which does contain some alcohol.  The patient does not consume any significant amount of alcohol.    Substance Use  The patient was counseled regarding the risk of overdose and death from using opioids and other narcotic drugs.  Discussed the risk of becoming reinfected with HCV once they are  cured if they resume IV drug use or inhaling drugs nasally.  The patient does not use drugs.    There is no contraindication to treating HCV in patients who are actively using drugs and the SVR/cure rate is the same as persons who no not use drugs.    Vaccinations  The  need for vaccination against viral hepatitis A and B will be assessed with serologic and instituted as appropriate.  The patient has not received  COVID-19 vaccine.    The patient was encouraged to take the COVID-19 vaccine.  Routine vaccinations against other bacterial and viral agents can be performed as indicated.  Annual flu vaccination should be administered if indicated.    ALLERGIES  Allergies   Allergen Reactions   ??? Fentanyl Itching, Swelling and Other (comments)     Burning     ??? Ultram [Tramadol] Nausea Only       MEDICATIONS  Current Outpatient Medications   Medication Sig   ??? ALPRAZolam (XANAX) 0.25 mg tablet    ??? HYDROmorphone (DILAUDID) 4 mg tablet Take 4 mg by mouth every six (6) hours as needed.   ??? methadone 10 mg/5 mL solution Take 70 mg by mouth daily.   ??? omeprazole (PRILOSEC) 20 mg capsule    ??? hydrOXYzine (ATARAX) 50 mg tablet Take 50 mg by mouth three (3) times daily as needed for Itching.   ??? lisinopril-hydrochlorothiazide (PRINZIDE, ZESTORETIC) 10-12.5 mg per tablet Take  by mouth daily.     No current facility-administered medications for this visit.       SYSTEM REVIEW NOT RELATED TO LIVER DISEASE OR REVIEWED ABOVE:  Constitution systems: Negative for fever, chills, weight gain, weight loss.   Eyes: Negative for visual changes.  ENT: Negative for sore throat, painful swallowing.   Respiratory: Negative for cough, hemoptysis, SOB.   Cardiology: Negative for chest pain, palpitations.  GI:  Negative for constipation or diarrhea.  GU: Negative for urinary frequency, dysuria, hematuria, nocturia.   Skin: Negative for rash.  Hematology: Negative for easy bruising, blood clots.    Musculo-skelatal: Negative for back pain, muscle  pain, weakness.  Neurologic: Negative for headaches, dizziness, vertigo, memory problems not related to HE.  Psychology: Negative for anxiety, depression.     FAMILY HISTORY:  The father died from "cancer" at age "4960 something".   The mother died of dementia at age 61.    Spouse "died from liver disease among other things".    There is no family history of immune disorders.    SOCIAL HISTORY:  The patient is widowed.    Spouse died "liver disease among other things".    The patient has 5 children and 9 grandchildren and one great grandson.   The patient currently smokes "4 to 5 cigarettes a day".    The patient has never consumed significant amounts of alcohol.    The patient has been abstinent from alcohol since "it's been a long time".   The patient is currently receiving disability.      PHYSICAL EXAMINATION:  Visit Vitals  BP 139/79   Pulse (!) 115   Temp 97.3 ??F (36.3 ??C) (Tympanic)   Ht 5' 1.5" (1.562 m)   Wt 177 lb (80.3 kg)   SpO2 98%   BMI 32.90 kg/m??     General: No acute distress.   Eyes: Sclera anicteric.   ENT: No oral lesions.  Thyroid normal.  Nodes: No adenopathy.   Skin: No spider angiomata.  No jaundice.  No palmar erythema.  Respiratory: Lungs clear to auscultation.   Cardiovascular: Regular heart rate.  No murmurs.  No JVD.  Abdomen: Soft non-tender.  Liver size normal to percussion/palpation.  Spleen not palpable. No obvious ascites.  Extremities: No edema.  No muscle wasting.  No gross arthritic changes.  Neurologic: Alert and oriented.  Cranial nerves grossly intact.  No asterixis.    LABORATORY STUDIES:  Recent liver function panel, CBC with platelet count and BMP are not available.  These studies will be performed.    SEROLOGIES:  Not available or performed.  Testing was performed today.    LIVER HISTOLOGY:  Not available or performed    ENDOSCOPIC PROCEDURES:  Not available or performed    RADIOLOGY:  Not available or performed    OTHER TESTING:  Not available or  performed    FOLLOW-UP:  All of the issues listed above in the Assessment and Plan were discussed with the patient.  All questions were answered.  The patient expressed a clear understanding of the above.    Follow-up [facility] in 4 weeks for Fibroscan and to initiate HCV treatment.      Lillia Pauls, FNP-C  [facility]  [phone] [facility], [address], [place] [address]   [phone]

## 2020-02-25 LAB — IRON AND TIBC
Iron Saturation: 31 % (ref 20–50)
Iron: 103 ug/dL (ref 50–175)
TIBC: 334 ug/dL (ref 250–450)

## 2020-02-25 LAB — AFP WITH AFP-L3%
AFP, SERUM: 1.9 ng/mL (ref 0.0–8.0)
AFP, serum: 1.9 ng/mL (ref 0.0–8.0)

## 2020-02-25 LAB — HEP A AB, TOTAL
HEP A AB, TOTAL, 006726: NEGATIVE
Hep A Ab, total: NEGATIVE

## 2020-02-25 LAB — FERRITIN
Ferritin: 175 NG/ML (ref 8–388)
Ferritin: 175 NG/ML (ref 8–388)

## 2020-02-25 LAB — HEPATITIS B SURFACE ANTIBODY
Hep B S Ab Interp: POSITIVE
Hep B S Ab: 200.1 m[IU]/mL (ref >10.0)

## 2020-02-25 LAB — HEPATITIS B CORE ANTIBODY, TOTAL: Hep B Core Total Ab: POSITIVE — AB

## 2020-02-25 LAB — HEP B SURFACE AB
Hep B surface Ab Interp.: POSITIVE
Hepatitis B surface Ab: 200.1 m[IU]/mL (ref >10.0)

## 2020-02-25 LAB — IRON PROFILE
Iron % saturation: 31 % (ref 20–50)
Iron: 103 ug/dL (ref 50–175)
TIBC: 334 ug/dL (ref 250–450)

## 2020-02-25 LAB — HEPATITIS B CORE AB, TOTAL: Hep B Core Ab, total: POSITIVE — AB

## 2020-02-26 LAB — HEP C GENOTYPE

## 2020-02-27 LAB — HCV REAL-TIME, PCR, QUANT
HEPATITIS C QUANTITATION, 550036: 339000 IU/mL
Hepatitis C Quantitation: 339000 IU/mL
Hepatitis C log 10: 5.53 log10 IU/mL
LOG 10, 550041: 5.53 log10 IU/mL

## 2020-02-27 LAB — HCV RNA BY NAA QL,RFLX TO QT
HCV RNA BY NAA, QL: POSITIVE — AB
HCV RNA by NAA, QL: POSITIVE — AB

## 2020-02-28 ENCOUNTER — Telehealth: Payer: Self-pay | Admitting: Oncology

## 2020-02-28 NOTE — Telephone Encounter (Signed)
Left a message for Valerie Kerr regarding her CT scan.  Advised that her insurance will not authorize a CT done in [place] and that it needs to be done in [place].  Requested a return call to discuss locations where she can have the CT done.

## 2020-03-02 ENCOUNTER — Telehealth: Payer: Self-pay | Admitting: Oncology

## 2020-03-02 NOTE — Telephone Encounter (Signed)
Kassady called back and said she would like to have the CT scheduled at [facility] in [place], [place].  Called them at [phone] opt 2 and they would need an order faxed to [phone] and they will contact the patient to schedule an appointment.

## 2020-03-02 NOTE — Telephone Encounter (Signed)
Left another message for Valerie Kerr regarding her CT scan.  Requested a return call.

## 2020-03-03 NOTE — Telephone Encounter (Signed)
Faxed CT order to [facility].  Also left a message for Kaylla advising they will be calling her to make an appointment.

## 2020-03-05 ENCOUNTER — Inpatient Hospital Stay: Admit: 2020-03-05 | Payer: MEDICARE | Primary: Internal Medicine

## 2020-03-05 DIAGNOSIS — R7989 Other specified abnormal findings of blood chemistry: Secondary | ICD-10-CM

## 2020-03-16 ENCOUNTER — Telehealth: Payer: Self-pay | Admitting: Oncology

## 2020-03-16 NOTE — Telephone Encounter (Signed)
Received call from [facility] asking about recent creatinine results.  Advised them of last creatinine result of 0.99 from 02/11/2020.

## 2020-03-18 ENCOUNTER — Encounter: Payer: Self-pay | Admitting: Oncology

## 2020-03-18 ENCOUNTER — Ambulatory Visit
Admission: RE | Admit: 2020-03-18 | Discharge: 2020-03-18 | Disposition: A | Discharge status: Home / Self Care | Payer: Self-pay | Source: Ambulatory Visit | Attending: Radiation Oncology | Admitting: Radiation Oncology

## 2020-03-18 DIAGNOSIS — C530 Malignant neoplasm of endocervix: Secondary | ICD-10-CM

## 2020-03-19 ENCOUNTER — Telehealth: Payer: Self-pay | Admitting: Oncology

## 2020-03-19 NOTE — Telephone Encounter (Signed)
Left a message regarding CT results.  Requested a return call.

## 2020-03-19 NOTE — Telephone Encounter (Signed)
Sean called back and advised of CT results.  Discussed that I will review them with Dr. Sondra Come on Monday and will call her with any recommendations. She also said that she has been having pain with walking (also said she walks like a "cowboy") and is wondering if this is being caused by the old fractures seen on the CT scan.

## 2020-03-25 ENCOUNTER — Telehealth: Payer: Self-pay | Admitting: Oncology

## 2020-03-25 NOTE — Telephone Encounter (Signed)
Called Valerie Kerr and advised her of the pap smear results from 02/10/20 and that we have scheduled her to see Dr. Andrey Farmer on 04/08/2020.  She is going to check to see if she can get a ride and will call back.  She also asked for a refill of Xanax and dilaudid.  Dr. Roselind Messier has been notified.

## 2020-03-27 ENCOUNTER — Other Ambulatory Visit: Payer: Self-pay | Admitting: Radiation Oncology

## 2020-03-27 DIAGNOSIS — F418 Other specified anxiety disorders: Secondary | ICD-10-CM

## 2020-03-27 MED ORDER — ALPRAZOLAM 0.25 MG PO TABS: 0.1250 mg | ORAL_TABLET | Freq: Two times a day (BID) | ORAL | 0 refills | Status: DC | PRN

## 2020-03-30 ENCOUNTER — Inpatient Hospital Stay
Admission: RE | Admit: 2020-03-30 | Discharge: 2020-03-30 | Disposition: A | Discharge status: Home / Self Care | Payer: Self-pay | Source: Ambulatory Visit | Attending: Gynecologic Oncology | Admitting: Gynecologic Oncology

## 2020-03-30 ENCOUNTER — Other Ambulatory Visit (HOSPITAL_COMMUNITY): Payer: Self-pay | Admitting: Gynecologic Oncology

## 2020-03-30 ENCOUNTER — Telehealth: Payer: Self-pay | Admitting: Oncology

## 2020-03-30 DIAGNOSIS — C801 Malignant (primary) neoplasm, unspecified: Secondary | ICD-10-CM

## 2020-03-30 NOTE — Telephone Encounter (Signed)
Called Tannis regarding her dilaudid refill request and asked if she will get in trouble with her methadone clinic if she has it filled.  She is going to talk to her doctor at [facility] to see if she can have a refill of dilaudid and will let us know.

## 2020-04-01 NOTE — Progress Notes (Deleted)
Follow-up Note: Gyn-Onc  Consult was initially requested by Dr. [name] for the evaluation of Valerie Kerr 60 y.o. female  CC:  No chief complaint on file.   Assessment/Plan:  [name]  is a 60 y.o.  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  Post treatment PET showed excellent response at cervix.  HPV positive - recommend repeat pap with HPV in October, 2021.  I will see her back in November, 2021 to follow-up the cervical cancer surveillance.  HPI: The patient is a 60 year old parous woman who is seen in consultation at the request of Dr Garwin Brothers for poorly differentiated squamous cell carcinoma of the cervix.  The patient reported having a pap smear approximately 4 years prior to cancer diagnosis in [place] which, was followed by a biopsy which, per patient, was "normal" and she was not informed that she would require special followup. She began experiencing postmenopausal bleeding at the beginning of 2016 and continued to bleed intermittently for 2 years.   She presented to the ER in [place] in November (25th), 2017 and a cervical mass was identified on pelvic exam.  She was seen in the office by Dr Baron Sane 03/01/16 who performed cervical biopsies of a friable mass which revealed poorly differentiated squamous cell carcinoma. Attempts were made to notify her of this result on 03/03/16, however a phone call was left to call back and there appears to be delay in her receiving the information about her result.  She presented to the office to be seen by Dr [name] for discussion regarding results on 03/16/16 and was informed of the results and the need to see oncology.   A PET had been ordered however, the patient cancelled this due to claustrophobia and fear of having a scan and getting bad results.  On 04/04/16 she underwent pretreatment  PET which showed 5.5 cm hypermetabolic cervical mass, consistent with known primary cervical carcinoma. No definite local or distant metastatic disease.Marland Kitchen  She was treated with primary radiation therapy and radiosensitizing chemotherapy with cisplatin between 04/12/16 and 06/20/16 with 45Gy to the pelvis with 9Gy boost, and 28 Gy additionally to the cervix with intracavitary brachytherapy.  Post treatment imaging on 06/29/16 showed excellent response at cervix but a new slightly enlarged PET avid right inguinal node was present.  Follow-up repeat PET on 10/20/16 showed complete resolution of PET avid findings at the cervix and inguinal node with no apparent malignant disease present.  On 12/28/17 she underwent surveillance exam with Dr Sondra Come. No recurrence was seen or felt, however screening pap showed ASCUS positive for high risk HPV. Colposcopy in October, 2019 was benign with inflammation but no dysplasia or malignancy identified.  Interval Hx:  She has chronic fatigue after radiation. No new symptoms of recurrence.   Pap in November, 2021 showed ASCUS with positive high risk HPV.  She returned on 04/08/20 for colposcopic evaluation and biopsies.   Current Meds:  Outpatient Encounter Medications as of 04/08/2020  Medication Sig  . ALPRAZolam (XANAX) 0.25 MG tablet Take 0.5 tablets (0.125 mg total) by mouth 2 (two) times daily as needed for anxiety.  . cetirizine (ZYRTEC) 10 MG tablet TAKE 1 TABLET BY MOUTH EVERY DAY  . fluticasone (FLONASE) 50 MCG/ACT nasal spray INSTILL 1 SPRAY INTO EACH NOSTRIL DAILY  . HYDROmorphone (DILAUDID) 4 MG tablet Take 1 tablet (4 mg total) by mouth every 6 (six)  hours as needed for severe pain.  Marland Kitchen lidocaine (LIDODERM) 5 % Place 1 patch onto the skin daily. Remove & Discard patch within 12 hours or as directed by MD  . lisinopril-hydrochlorothiazide (ZESTORETIC) 20-25 MG tablet Take 1 tablet by mouth daily.  . methadone (DOLOPHINE) 10 MG/5ML solution Take 50 mg by  mouth daily.   Marland Kitchen omeprazole (PRILOSEC) 20 MG capsule Take 1 capsule (20 mg total) by mouth daily. Must have office visit for refills  . prochlorperazine (COMPAZINE) 10 MG tablet Take 1 tablet (10 mg total) by mouth every 6 (six) hours as needed for nausea.  . Vitamin D, Ergocalciferol, (DRISDOL) 50000 units CAPS capsule Take 1 capsule (50,000 Units total) by mouth every 7 (seven) days. (Patient taking differently: Take 50,000 Units by mouth every Saturday. )  . VOLTAREN 1 % GEL Apply 4 g topically 4 (four) times daily.   No facility-administered encounter medications on file as of 04/08/2020.    Allergy:  Allergies  Allergen Reactions  . Codeine Itching and Rash  . Fentanyl Rash    Skin rash, local swelling from patch  . Hydrocodone-Acetaminophen Rash  . Tramadol Other (See Comments)    GI upset,also trembling sensation    Social Hx:   Social History   Socioeconomic History  . Marital status: Divorced    Spouse name: Not on file  . Number of children: 5  . Years of education: Not on file  . Highest education level: Not on file  Occupational History  . Occupation: disabled  Tobacco Use  . Smoking status: Current Every Day Smoker    Packs/day: 0.20    Years: 45.00    Pack years: 9.00    Types: Cigarettes  . Smokeless tobacco: Never Used  . Tobacco comment: 1 week since last cigarette  Vaping Use  . Vaping Use: Never used  Substance and Sexual Activity  . Alcohol use: No  . Drug use: No  . Sexual activity: Not Currently  Other Topics Concern  . Not on file  Social History Narrative   Lives with niece in a 2 story home.  Has 5 children.  On disability.  Formerly worked in Stage manager at [employer].  Education: 11th grade.    Social Determinants of Health   Financial Resource Strain: Not on file  Food Insecurity: Not on file  Transportation Needs: Not on file  Physical Activity: Not on file  Stress: Not on file  Social Connections: Not on file  Intimate Partner  Violence: Not on file    Past Surgical Hx:  Past Surgical History:  Procedure Laterality Date  . ABCESS DRAINAGE Left 1982   breast  . CARPAL TUNNEL RELEASE Bilateral   . CERVICAL DISC SURGERY  2010  . MULTIPLE EXTRACTIONS WITH ALVEOLOPLASTY N/A 04/08/2016   Procedure: Extraction of tooth #'s [id number] with alveoloplasty  and gross debridement of remaining teeth;  Surgeon: Charlynne Pander, DDS;  Location: [facility] OR;  Service: Oral Surgery;  Laterality: N/A;  . TANDEM RING INSERTION N/A 05/26/2016   Procedure: TANDEM RING INSERTION;  Surgeon: Antony Blackbird, MD;  Location: [facility];  Service: Urology;  Laterality: N/A;  . TANDEM RING INSERTION N/A 06/02/2016   Procedure: TANDEM RING INSERTION;  Surgeon: Antony Blackbird, MD;  Location: [facility];  Service: Urology;  Laterality: N/A;  . TANDEM RING INSERTION N/A 06/20/2016   Procedure: TANDEM RING INSERTION;  Surgeon: Antony Blackbird, MD;  Location: [facility];  Service: Urology;  Laterality: N/A;  . TANDEM RING INSERTION N/A 06/23/2016   Procedure: TANDEM RING INSERTION;  Surgeon: [name], MD;  Location: [facility];  Service: Urology;  Laterality: N/A;  . TANDEM RING INSERTION N/A 07/04/2016   Procedure: TANDEM RING INSERTION;  Surgeon: [name], MD;  Location: [facility];  Service: Urology;  Laterality: N/A;    Past Medical Hx:  Past Medical History:  Diagnosis Date  . Anxiety   . Cervical cancer [facility]) oncologist-  dr gorsuch/ dr Sondra Come   02-20-2016 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---  treatment concurrent chemo (week 6 on hold due to pancytopenia) and radiation therapy (external beam 04-12-2016 to 05-31-2016)  started high-dose rate brachytherapy 05-26-2016  . Chemotherapy-induced thrombocytopenia   . Chronic pain disorder    back,neck-- chronic methadone  . Environmental allergies    allergy to dust mites  . GERD (gastroesophageal  reflux disease)   . Headache   . Herniated disc, cervical   . History of external beam radiation therapy 04/12/16-05/31/16   pelvis 45 Gy in 25 fractions, in  30 sessions, pelvis boost 9 Gy in 5 fractions  . History of radiation therapy 06/20/16-07/04/16   tandem and ring applicator to cervix 28 Gy in 5 fractions  . Hypertension   . Hypokalemia   . Hypomagnesemia    po supplement and IV replacement  . Leukopenia due to antineoplastic chemotherapy ([place])   . Lumbar herniated disc   . Pancytopenia due to chemotherapy ([place])   . Periodontitis     Past Gynecological History:  Denies history of abnormal paps. Last pap was 4 years ago in [place] per patient. SVD x 5 No LMP recorded. Patient is postmenopausal.  Family Hx:  Family History  Problem Relation Age of Onset  . Cancer Father   . Cancer Sister        throat  . Cancer Brother        throat and stomach  . Cancer Brother        lung    Review of Systems:  Constitutional  Feels fatigue, sadness, generalized weakness, anxious.    ENT Normal appearing ears and nares bilaterally Skin/Breast  +sores, no jaundice, itching, dryness Cardiovascular  No chest pain, shortness of breath, or edema  Pulmonary  No cough or wheeze.  Gastro Intestinal  No nausea, vomitting, or diarrhoea. No bright red blood per rectum, no abdominal pain, change in bowel movement.  Genito Urinary  No frequency, urgency, dysuria, . Musculo Skeletal  + myalgia, arthralgia, pain Neurologic  No weakness, numbness, change in gait,  Psychology  No depression, anxiety, insomnia.   Vitals:  There were no vitals taken for this visit.  Physical Exam: WD in NAD Neck  Supple NROM, without any enlargements.  Lymph Node Survey No cervical supraclavicular or inguinal adenopathy Cardiovascular  Pulse normal rate, regularity and rhythm. S1 and S2 normal.  Lungs  Clear to auscultation bilateraly, without wheezes/crackles/rhonchi. Good air movement.  Skin   No rashes  Psychiatry  Alert and oriented to person, place, and time  Abdomen  Normoactive bowel sounds, abdomen soft, non-tender and nonobese without evidence of hernia.  Back No CVA tenderness Genito Urinary  Vulva/vagina: Normal external female genitalia.  No lesions. No discharge or bleeding.  Bladder/urethra:  No lesions or masses, well supported bladder  Vagina: grossly normal  Cervix: normal, atrophic, flush with vagina. *** colpo and biopsies done (see note below).   Uterus:  Small, mobile,  Adnexa: no masses. Rectal  No lesions or masses Extremities  No bilateral cyanosis, clubbing or edema.  Procedure Note:  Preop Dx: ASCUS pap with high risk HPV, hx of cervical cancer Postop Dx: same Procedure: colposcopy of vagina and cervix with cervical biopsies Surgeon: Dorann Ou, MD EBL: *** Specimens: *** Complications: *** Procedure Details: ***    Thereasa Solo, MD  04/01/2020, 11:19 AM    Lunch

## 2020-04-07 ENCOUNTER — Telehealth: Payer: Self-pay

## 2020-04-07 NOTE — Telephone Encounter (Signed)
R/s appointment for 05-18-20 at 1445.

## 2020-04-07 NOTE — Telephone Encounter (Signed)
LM for Valerie Kerr to call back to the office to reschedule her appointment for 04-08-20 as she requested.

## 2020-04-08 ENCOUNTER — Inpatient Hospital Stay: Payer: Medicare Other | Admitting: Gynecologic Oncology

## 2020-04-08 DIAGNOSIS — R87610 Atypical squamous cells of undetermined significance on cytologic smear of cervix (ASC-US): Secondary | ICD-10-CM

## 2020-04-08 DIAGNOSIS — C530 Malignant neoplasm of endocervix: Secondary | ICD-10-CM

## 2020-04-29 ENCOUNTER — Encounter: Attending: Family | Primary: Internal Medicine

## 2020-05-15 ENCOUNTER — Encounter: Payer: Self-pay | Admitting: Gynecologic Oncology

## 2020-05-15 ENCOUNTER — Telehealth: Payer: Self-pay

## 2020-05-15 NOTE — Telephone Encounter (Signed)
TC to review meaningful use for upcoming appointment.  No answer, left message to return call.

## 2020-05-18 ENCOUNTER — Inpatient Hospital Stay: Payer: Medicare Other | Attending: Gynecologic Oncology | Admitting: Gynecologic Oncology

## 2020-05-18 ENCOUNTER — Encounter: Payer: Self-pay | Admitting: Gynecologic Oncology

## 2020-05-18 ENCOUNTER — Other Ambulatory Visit: Payer: Self-pay | Admitting: Radiation Oncology

## 2020-05-18 ENCOUNTER — Other Ambulatory Visit: Payer: Self-pay

## 2020-05-18 ENCOUNTER — Other Ambulatory Visit (HOSPITAL_COMMUNITY)
Admission: RE | Admit: 2020-05-18 | Discharge: 2020-05-18 | Disposition: A | Discharge status: Home / Self Care | Payer: Medicare Other | Source: Ambulatory Visit | Attending: Gynecologic Oncology | Admitting: Gynecologic Oncology

## 2020-05-18 VITALS — BP 145/89 | HR 94 | Temp 96.8°F | Resp 22 | Ht 61.5 in | Wt 176.0 lb

## 2020-05-18 DIAGNOSIS — Z8541 Personal history of malignant neoplasm of cervix uteri: Secondary | ICD-10-CM | POA: Insufficient documentation

## 2020-05-18 DIAGNOSIS — Z923 Personal history of irradiation: Secondary | ICD-10-CM | POA: Diagnosis not present

## 2020-05-18 DIAGNOSIS — F418 Other specified anxiety disorders: Secondary | ICD-10-CM

## 2020-05-18 DIAGNOSIS — R87612 Low grade squamous intraepithelial lesion on cytologic smear of cervix (LGSIL): Secondary | ICD-10-CM | POA: Insufficient documentation

## 2020-05-18 DIAGNOSIS — Z79899 Other long term (current) drug therapy: Secondary | ICD-10-CM | POA: Insufficient documentation

## 2020-05-18 DIAGNOSIS — F17210 Nicotine dependence, cigarettes, uncomplicated: Secondary | ICD-10-CM | POA: Insufficient documentation

## 2020-05-18 DIAGNOSIS — Z1151 Encounter for screening for human papillomavirus (HPV): Secondary | ICD-10-CM | POA: Insufficient documentation

## 2020-05-18 DIAGNOSIS — Z01419 Encounter for gynecological examination (general) (routine) without abnormal findings: Secondary | ICD-10-CM | POA: Insufficient documentation

## 2020-05-18 DIAGNOSIS — A630 Anogenital (venereal) warts: Secondary | ICD-10-CM | POA: Insufficient documentation

## 2020-05-18 DIAGNOSIS — Z9221 Personal history of antineoplastic chemotherapy: Secondary | ICD-10-CM | POA: Diagnosis not present

## 2020-05-18 DIAGNOSIS — C530 Malignant neoplasm of endocervix: Secondary | ICD-10-CM | POA: Insufficient documentation

## 2020-05-18 DIAGNOSIS — R87810 Cervical high risk human papillomavirus (HPV) DNA test positive: Secondary | ICD-10-CM | POA: Diagnosis not present

## 2020-05-18 MED ORDER — ALPRAZOLAM 0.25 MG PO TABS: 0.1250 mg | ORAL_TABLET | Freq: Two times a day (BID) | ORAL | 0 refills | Status: DC | PRN

## 2020-05-18 NOTE — Progress Notes (Signed)
Follow-up Note: Gyn-Onc  Consult was requested by Dr. Elly Kerr for the evaluation of Valerie Kerr 60 y.o. female  CC:  Chief Complaint  Patient presents with  . Malignant neoplasm of endocervix [facility])    Assessment/Plan:  [name]  is a 60 y.o.  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  Post treatment PET showed excellent response at cervix.  HPV positive - follow-up today's pap. No evidence of dysplasia on today's exam.  She will see Dr Valerie Kerr in August and myself in February, 2023. If she remains disease free at that time we will suspend follow-up.   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  The patient reports last having a pap smear approximately 4 years ago in [place] which, was followed by a biopsy which, per patient, was "normal" and she was not informed that she would require special followup. She began experiencing postmenopausal bleeding at the beginning of 2016 and continued to bleed intermittently for 2 years.   She presented to the ER in [place] in November (25th), 2017 and a cervical mass was identified on pelvic exam.  She was seen in the office by Dr Valerie Kerr 03/01/16 who performed cervical biopsies of a friable mass which revealed poorly differentiated squamous cell carcinoma. Attempts were made to notify her of this result on 03/03/16, however a phone call was left to call back and there appears to be delay in her receiving the information about her result.  She presented to the office to be seen by Dr Valerie Kerr for discussion regarding results on 03/16/16 and was informed of the results and the need to see oncology.   A PET had been ordered however, the patient cancelled this due to claustrophobia  and fear of having a scan and getting bad results.  On 04/04/16 she underwent pretreatment PET which showed 5.5 cm hypermetabolic cervical mass, consistent with known primary cervical carcinoma. No definite local or distant metastatic disease.Marland Kitchen  She was treated with primary radiation therapy and radiosensitizing chemotherapy with cisplatin between 04/12/16 and 06/20/16 with 45Gy to the pelvis with 9Gy boost, and 28 Gy additionally to the cervix with intracavitary brachytherapy.  Post treatment imaging on 06/29/16 showed excellent response at cervix but a new slightly enlarged PET avid right inguinal node was present.  Follow-up repeat PET on 10/20/16 showed complete resolution of PET avid findings at the cervix and inguinal node with no apparent malignant disease present.  On 12/28/17 she underwent surveillance exam with Dr Valerie Kerr. No recurrence was seen or felt, however screening pap showed ASCUS positive for high risk HPV. Colposcopy in October, 2019 was benign with inflammation but no dysplasia or malignancy identified.   Interval Hx:  Pap on 02/10/20 showed ASC-US, HR HPV detected.   Pelvic exam by Dr Valerie Kerr in November, 2021 was positive for a pelvic mass.  A CT was ordered in [place] and was negative for recurrence.   Given the limitations of colposcopy with a post-radiation cervix, colposcopy was deferred.  Current Meds:  Outpatient Encounter Medications as of 62/21/2022  Medication Sig  . cetirizine (ZYRTEC) 10 MG tablet TAKE 1 TABLET BY MOUTH EVERY DAY  . cyclobenzaprine (FLEXERIL) 10 MG tablet Take by mouth.  . fluticasone (FLONASE) 50 MCG/ACT nasal spray INSTILL 1 SPRAY  INTO EACH NOSTRIL DAILY  . HYDROmorphone (DILAUDID) 4 MG tablet Take 1 tablet (4 mg total) by mouth every 6 (six) hours as needed for severe pain.  Marland Kitchen levocetirizine (XYZAL) 5 MG tablet Take 5 mg by mouth at bedtime.  . lidocaine (LIDODERM) 5 % Place 1 patch onto the skin daily. Remove & Discard patch within 12 hours or  as directed by MD  . lisinopril-hydrochlorothiazide (ZESTORETIC) 20-25 MG tablet Take 1 tablet by mouth daily.  . methadone (DOLOPHINE) 10 MG/5ML solution Take 50 mg by mouth daily.   . methadone (DOLOPHINE) 10 MG/5ML solution Take 70 mg by mouth daily in the afternoon.  Marland Kitchen omeprazole (PRILOSEC) 20 MG capsule Take 1 capsule (20 mg total) by mouth daily. Must have office visit for refills  . prochlorperazine (COMPAZINE) 10 MG tablet Take 1 tablet (10 mg total) by mouth every 6 (six) hours as needed for nausea.  . Vitamin D, Ergocalciferol, (DRISDOL) 50000 units CAPS capsule Take 1 capsule (50,000 Units total) by mouth every 7 (seven) days. (Patient taking differently: Take 50,000 Units by mouth every Saturday.)  . VOLTAREN 1 % GEL Apply 4 g topically 4 (four) times daily.  . [DISCONTINUED] ALPRAZolam (XANAX) 0.25 MG tablet Take 0.5 tablets (0.125 mg total) by mouth 2 (two) times daily as needed for anxiety.   No facility-administered encounter medications on file as of 62/21/2022.    Allergy:  Allergies  Allergen Reactions  . Codeine Itching and Rash  . Fentanyl Rash    Skin rash, local swelling from patch  . Hydrocodone-Acetaminophen Rash  . Tramadol Other (See Comments)    GI upset,also trembling sensation    Social Hx:   Social History   Socioeconomic History  . Marital status: Divorced    Spouse name: Not on file  . Number of children: 5  . Years of education: Not on file  . Highest education level: Not on file  Occupational History  . Occupation: disabled  Tobacco Use  . Smoking status: Current Every Day Smoker    Packs/day: 0.20    Years: 45.00    Pack years: 9.00    Types: Cigarettes  . Smokeless tobacco: Never Used  . Tobacco comment: 1 week since last cigarette  Vaping Use  . Vaping Use: Never used  Substance and Sexual Activity  . Alcohol use: No  . Drug use: No  . Sexual activity: Not Currently  Other Topics Concern  . Not on file  Social History Narrative    Lives with niece in a 2 story home.  Has 5 children.  On disability.  Formerly worked in Actuary at [employer].  Education: 11th grade.    Social Determinants of Health   Financial Resource Strain: Not on file  Food Insecurity: Not on file  Transportation Needs: Not on file  Physical Activity: Not on file  Stress: Not on file  Social Connections: Not on file  Intimate Partner Violence: Not on file    Past Surgical Hx:  Past Surgical History:  Procedure Laterality Date  . ABCESS DRAINAGE Left 1982   breast  . CARPAL TUNNEL RELEASE Bilateral   . CERVICAL DISC SURGERY  2010  . MULTIPLE EXTRACTIONS WITH ALVEOLOPLASTY N/A 04/08/2016   Procedure: Extraction of tooth #'s [phone] with alveoloplasty  and gross debridement of remaining teeth;  Surgeon: Lenn Cal, DDS;  Location: [facility];  Service: Oral Surgery;  Laterality: N/A;  . TANDEM RING INSERTION N/A 05/26/2016   Procedure: TANDEM RING INSERTION;  Surgeon: [name], MD;  Location: [facility];  Service: Urology;  Laterality: N/A;  . TANDEM RING INSERTION N/A 06/02/2016   Procedure: TANDEM RING INSERTION;  Surgeon: [name], MD;  Location: [facility];  Service: Urology;  Laterality: N/A;  . TANDEM RING INSERTION N/A 06/20/2016   Procedure: TANDEM RING INSERTION;  Surgeon: [name], MD;  Location: [facility];  Service: Urology;  Laterality: N/A;  . TANDEM RING INSERTION N/A 06/23/2016   Procedure: TANDEM RING INSERTION;  Surgeon: [name], MD;  Location: [facility];  Service: Urology;  Laterality: N/A;  . TANDEM RING INSERTION N/A 07/04/2016   Procedure: TANDEM RING INSERTION;  Surgeon: [name], MD;  Location: [facility];  Service: Urology;  Laterality: N/A;    Past Medical Hx:  Past Medical History:  Diagnosis Date  . Anxiety   . Cervical cancer [facility]) oncologist-  dr gorsuch/ dr Valerie Kerr   02-20-2016 dx FIGO IIB poor  differentiated squamous cell carcinoma---  treatment concurrent chemo (week 6 on hold due to pancytopenia) and radiation therapy (external beam 04-12-2016 to 05-31-2016)  started high-dose rate brachytherapy 05-26-2016  . Chemotherapy-induced thrombocytopenia   . Chronic pain disorder    back,neck-- chronic methadone  . Environmental allergies    allergy to dust mites  . GERD (gastroesophageal reflux disease)   . Headache   . Herniated disc, cervical   . History of external beam radiation therapy 04/12/16-05/31/16   pelvis 45 Gy in 25 fractions, in  30 sessions, pelvis boost 9 Gy in 5 fractions  . History of radiation therapy 06/20/16-07/04/16   tandem and ring applicator to cervix 28 Gy in 5 fractions  . Hypertension   . Hypokalemia   . Hypomagnesemia    po supplement and IV replacement  . Leukopenia due to antineoplastic chemotherapy ([place])   . Lumbar herniated disc   . Pancytopenia due to chemotherapy ([place])   . Periodontitis     Past Gynecological History:  Denies history of abnormal paps. Last pap was 4 years ago in [place] per patient. SVD x 5 No LMP recorded. Patient is postmenopausal.  Family Hx:  Family History  Problem Relation Age of Onset  . Cancer Father   . Cancer Sister        throat  . Cancer Brother        throat and stomach  . Cancer Brother        lung    Review of Systems:  Constitutional  Feels fatigue, sadness, generalized weakness, anxious.    ENT Normal appearing ears and nares bilaterally Skin/Breast  +sores, no jaundice, itching, dryness Cardiovascular  No chest pain, shortness of breath, or edema  Pulmonary  No cough or wheeze.  Gastro Intestinal  No nausea, vomitting, or diarrhoea. No bright red blood per rectum, no abdominal pain, change in bowel movement.  Genito Urinary  No frequency, urgency, dysuria, . Musculo Skeletal  + myalgia, arthralgia, pain Neurologic  No weakness, numbness, change in gait,  Psychology  No depression,  anxiety, insomnia.   Vitals:  Blood pressure (!) 145/89, pulse 94, temperature (!) 96.8 F (36 C), temperature source Tympanic, resp. rate (!) 22, height 5' 1.5" (1.562 m), weight 176 lb (79.8 kg), SpO2 97 %.  Physical Exam: WD in NAD Neck  Supple NROM, without any enlargements.  Lymph Node Survey No cervical supraclavicular or inguinal adenopathy Cardiovascular  Pulse normal rate, regularity and rhythm. S1 and S2 normal.  Lungs  Clear to auscultation bilateraly, without wheezes/crackles/rhonchi. Good air movement.  Skin  No rashes  Psychiatry  Alert and oriented to person, place, and time  Abdomen  Normoactive bowel sounds, abdomen soft, non-tender and nonobese without evidence of hernia.  Back No CVA tenderness Genito Urinary  Vulva/vagina: Normal external female genitalia.  No lesions. No discharge or bleeding.  Bladder/urethra:  No lesions or masses, well supported bladder  Vagina: grossly normal  Cervix: normal, atrophic, flush with vagina. Pap taken. No gross high grade dysplasia or invasive carcinoma.   Uterus:  Small, mobile,  Adnexa: no masses. Rectal  No lesions or masses Extremities  No bilateral cyanosis, clubbing or edema.    Thereasa Solo, MD  05/18/2020, 3:15 PM    Lunch

## 2020-05-18 NOTE — Patient Instructions (Signed)
Please notify Dr [name] at phone number [phone] if you notice vaginal bleeding, new pelvic or abdominal pains, bloating, feeling full easy, or a change in bladder or bowel function.   Please have Dr Clabe Seal office contact Dr Serita Grit office (at [phone]) in August after your appointment with him to request an appointment with Dr [name] for February, 2023.

## 2020-05-20 ENCOUNTER — Telehealth: Payer: Self-pay | Admitting: *Deleted

## 2020-05-20 ENCOUNTER — Telehealth: Payer: Self-pay

## 2020-05-20 LAB — CYTOLOGY - PAP
Comment: NEGATIVE
High risk HPV: POSITIVE — AB

## 2020-05-20 NOTE — Telephone Encounter (Signed)
Fax request for images from CT on 03/18/2020 at [facility]

## 2020-05-20 NOTE — Telephone Encounter (Signed)
Faxed signed Driver's log to [place] at [phone].

## 2020-05-22 ENCOUNTER — Telehealth: Payer: Self-pay

## 2020-05-22 NOTE — Telephone Encounter (Signed)
Told Ms Rauls that the pap smear showed low grade changes per Melissa Cross,NP.  Follow up as planned. Faxed signed Drivers Log for 9-93-57 trip to [facility] at [phone].

## 2020-06-04 LAB — HEMOGLOBIN A1C
Estimated Avg Glucose, External: 225 mg/dL — ABNORMAL HIGH (ref 91–123)
Hemoglobin A1C, External: 9.5 % — ABNORMAL HIGH (ref 4.8–5.6)

## 2020-06-11 DIAGNOSIS — E119 Type 2 diabetes mellitus without complications: Secondary | ICD-10-CM | POA: Insufficient documentation

## 2020-06-11 DIAGNOSIS — E1169 Type 2 diabetes mellitus with other specified complication: Secondary | ICD-10-CM | POA: Insufficient documentation

## 2020-06-17 ENCOUNTER — Ambulatory Visit: Payer: MEDICARE | Attending: Family | Primary: Internal Medicine

## 2020-07-16 ENCOUNTER — Other Ambulatory Visit: Payer: Self-pay | Admitting: Urology

## 2020-07-16 ENCOUNTER — Telehealth: Payer: Self-pay | Admitting: Radiology

## 2020-07-16 DIAGNOSIS — F418 Other specified anxiety disorders: Secondary | ICD-10-CM

## 2020-07-16 MED ORDER — ALPRAZOLAM 0.25 MG PO TABS: 0.1250 mg | ORAL_TABLET | Freq: Two times a day (BID) | ORAL | 0 refills | Status: DC | PRN

## 2020-07-16 NOTE — Telephone Encounter (Signed)
Patient requests refill of alprazolam 0.125 mg.

## 2020-07-16 NOTE — Telephone Encounter (Signed)
Notified patient of script sent to pharmacy. 

## 2020-07-21 ENCOUNTER — Emergency Department: Admit: 2020-07-22 | Payer: PRIVATE HEALTH INSURANCE | Primary: Internal Medicine

## 2020-07-21 ENCOUNTER — Inpatient Hospital Stay
Admit: 2020-07-21 | Discharge: 2020-07-22 | Disposition: A | Discharge status: Home / Self Care | Payer: PRIVATE HEALTH INSURANCE | Attending: Emergency Medicine

## 2020-07-21 DIAGNOSIS — M8448XA Pathological fracture, other site, initial encounter for fracture: Secondary | ICD-10-CM

## 2020-07-21 NOTE — ED Notes (Signed)
Pt in for lower back pain onset of 3 days.  Reports a PMH of sciatica pain and It feels like the same pain.

## 2020-07-21 NOTE — ED Provider Notes (Signed)
ED Provider Notes by Lynnae Prude, PA at 07/21/20 1939                Author: Lynnae Prude, PA  Service: Emergency Medicine  Author Type: Physician Assistant       Filed: 07/22/20 0029  Date of Service: 07/21/20 1939  Status: Attested           Editor: Lynnae Prude, PA (Physician Assistant)  Cosigner: Rosanne Ashing, MD at 07/22/20 1258          Attestation signed by Rosanne Ashing, MD at 07/22/20 1258          I was personally available for consultation in the emergency department.                                    EMERGENCY DEPARTMENT HISTORY AND PHYSICAL EXAM      Date: 07/21/2020   Patient Name: Erica Zamora        History of Presenting Illness          Chief Complaint       Patient presents with        ?  Back Pain              History Provided By: Patient      Chief Complaint: lower back pain          Additional History (Context):    7:39 PM   CAROLINE LONGIE is a 60 y.o.  female with PMHX hypertension, chronic low back pain, arthritis, presents to the emergency department C/O back pain.  Patient has been having  pain gradually worsening over the past week or so.  No trauma.  States she has chronic low back pain and has had sciatica before and this does feel somewhat similar.  States she saw her orthopedist who prescribed a steroid which she finished without relief.   States that she has been having tingling numbness weakness in the lower extremities.  No groin numbness.  No urinary symptoms.  No incontinence or urinary retention.  No abdominal pain.  States she had some abnormalities on her abdominal CT scan ( IMPRESSION:   No evidence of intraabdominal or intrapelvic metastasis.   Old fracture involving the right symphysis pubis (present on the plain radiography of October 17, 2019) without union. ??Old fractures of the sacral ala bilaterally, also  retrospectively present on plain radiography of October 17, 2019. ??Trauma history? ??Pathologic fracture not excluded. ??Consider bone  scan assessment)      Patient states that her orthopedist ordered an MRI but is not due till the 16th.      PCP: Ardelle Lesches, MD        Current Outpatient Medications          Medication  Sig  Dispense  Refill           ?  ALPRAZolam (XANAX) 0.25 mg tablet           ?  HYDROmorphone (DILAUDID) 4 mg tablet  Take 4 mg by mouth every six (6) hours as needed.         ?  methadone 10 mg/5 mL solution  Take 70 mg by mouth daily.         ?  omeprazole (PRILOSEC) 20 mg capsule           ?  cetirizine (ZYRTEC) 10  mg tablet  Take 1 Tablet by mouth daily.         ?  levocetirizine (XYZAL) 5 mg tablet  Take 5 mg by mouth daily.         ?  lidocaine (LIDODERM) 5 %           ?  hydrOXYzine (ATARAX) 50 mg tablet  Take 50 mg by mouth three (3) times daily as needed for Itching.               ?  lisinopril-hydrochlorothiazide (PRINZIDE, ZESTORETIC) 10-12.5 mg per tablet  Take  by mouth daily.                 Past History        Past Medical History:     Past Medical History:        Diagnosis  Date         ?  Arthritis           ?  Essential hypertension             Past Surgical History:   No past surgical history on file.      Family History:   No family history on file.      Social History:     Social History          Tobacco Use         ?  Smoking status:  Current Every Day Smoker              Packs/day:  0.50         ?  Smokeless tobacco:  Never Used       Substance Use Topics         ?  Alcohol use:  Not Currently         ?  Drug use:  Not Currently           Allergies:     Allergies        Allergen  Reactions         ?  Fentanyl  Itching, Swelling and Other (comments)             Burning            ?  Ultram [Tramadol]  Nausea Only             Review of Systems     Review of Systems    Constitutional: Negative for chills and fever.    Respiratory: Negative for cough.     Cardiovascular: Negative for chest pain, palpitations and leg swelling.    Gastrointestinal: Negative for abdominal pain, diarrhea, nausea and vomiting.     Genitourinary: Negative for decreased urine volume, difficulty urinating, dyspareunia, dysuria, enuresis, flank pain, frequency, hematuria, pelvic pain and urgency.    Musculoskeletal: Positive for back pain.    Neurological: Positive for weakness and numbness . Negative for dizziness, light-headedness and headaches.    All other systems reviewed and are negative.           Physical Exam          Vitals:             07/21/20 1932  07/21/20 1934  07/21/20 1935  07/21/20 2311           BP:  (!) 148/78  129/78    122/84     Pulse:  83      79     Resp:  18      16     Temp:  97.1 ??F (36.2 ??C)           SpO2:  98%      98%     Weight:  80.7 kg (178 lb)    80.7 kg (178 lb)             Height:      5\' 2"  (1.575 m)          Physical Exam   Vitals and nursing note reviewed.   Constitutional:        Appearance: She is well-developed.   HENT :       Head: Normocephalic and atraumatic.   Cardiovascular :       Rate and Rhythm: Normal rate and regular rhythm.      Heart sounds: Normal heart sounds. No murmur heard.        Pulmonary:       Effort: Pulmonary effort is normal. No respiratory distress.      Breath sounds: Normal breath sounds. No wheezing or rales.    Abdominal:      General: Bowel sounds are normal.      Palpations: Abdomen is soft.      Tenderness: There is no abdominal tenderness. There is no right CVA tenderness or left CVA tenderness.     Musculoskeletal:          General: Tenderness present.      Cervical back: Normal range of motion and neck supple.      Comments:  Strength 5 out of 5 in the bilateral lower extremities, sensation intact distally, pulses 2+ for DP and PT, negative straight leg raise bilaterally     Neurological:       Mental Status: She is alert and oriented to person, place, and time.    Psychiatric:         Judgment: Judgment normal.               Diagnostic Study Results        Labs:    No results found for this or any previous visit (from the past 12 hour(s)).      Radiologic Studies:       MRI LUMB SPINE WO CONT    (Results Pending)          CT Results   (Last 48 hours)          None                 CXR Results   (Last 48 hours)          None                    Medical Decision Making     I am the first provider for this patient.      I reviewed the vital signs, available nursing notes, past medical history, past surgical history, family history and social history.      Vital Signs: Reviewed the patient's vital signs.      Pulse Oximetry Analysis: 98% on RA          Records Reviewed: Nursing Notes and Old Medical Records      Procedures:   Procedures      ED Course:    7:39 PM Initial assessment performed. The patients presenting problems have been discussed, and they are in agreement with the care plan formulated and outlined with them.  I have encouraged them to ask questions as they arise throughout their visit.      Case discussed with Maryruth Hancockrisha Anest, MD         Discussion:   Pt presents with acute on chronic low back pain.  Patient was seen by her orthopedist and started on a steroid which states has not helped.  Complaining of pain into both lower extremities.  Neurovascularly  intact strength intact bilaterally.  She was scheduled for an MRI.  MRI of L-spine obtained which showed chronic findings but no evidence of cord compression.  Patient is ambulatory in the ED able to walk up and down the hall without assistance.  Patient  requesting Dilaudid by name.  Explained with her taking Xanax and methadone will not be able to prescribe any additional narcotic pain medication and she would have to follow-up with her orthopedist tomorrow. Strict return precautions given, pt offering  no questions or complaints.        Diagnosis and Disposition        DISCHARGE NOTE:   Pernell DupreBrenda J Mifsud's  results have been reviewed with her .  She has been counseled regarding her  diagnosis, treatment, and plan.  She verbally conveys understanding and agreement of the signs, symptoms, diagnosis, treatment and  prognosis  and additionally agrees to follow up as discussed.  She also agrees with the care-plan and conveys that all of  her questions have been answered.  I have also provided discharge instructions for her that  include: educational information regarding their diagnosis and treatment, and list of reasons why they would want to return to the ED prior to their follow-up appointment, should her  condition change. She has been provided with education for proper emergency department utilization.       CLINICAL IMPRESSION:         1.  Chronic pain syndrome         2.  Sacral insufficiency fracture, initial encounter            PLAN:   1. D/C Home   2.      Discharge Medication List as of 07/21/2020 11:03 PM               3.      Follow-up Information               Follow up With  Specialties  Details  Why  Contact Info              Lorin PicketScott, Sharen Hintavid Allan, MD  Physical Medicine and Rehabilitation  Schedule an appointment as soon as possible for a visit     [address]   [address]   [place] [id number]   [phone]                 [facility]  Emergency Medicine    If symptoms worsen  [address]   [place] [id number]   [phone]                          Please note that this dictation was completed with Dragon, the computer voice recognition software.  Quite often unanticipated grammatical, syntax, homophones, and other interpretive errors are  inadvertently transcribed by the computer software.  Please disregard these errors.  Please excuse any errors that have escaped final proofreading.

## 2020-07-22 MED ORDER — LORAZEPAM 2 MG/ML IJ SOLN
2 mg/mL | Freq: Once | INTRAMUSCULAR | Status: AC
Start: 2020-07-22 — End: 2020-07-21
  Administered 2020-07-22: 01:00:00 via INTRAMUSCULAR

## 2020-07-22 MED ORDER — HYDROCODONE-ACETAMINOPHEN 5 MG-325 MG TAB
5-325 mg | ORAL | Status: AC
Start: 2020-07-22 — End: 2020-07-21
  Administered 2020-07-22: 03:00:00 via ORAL

## 2020-07-22 MED ORDER — KETOROLAC TROMETHAMINE 60 MG/2 ML IM
60 mg/2 mL | INTRAMUSCULAR | Status: AC
Start: 2020-07-22 — End: 2020-07-21
  Administered 2020-07-22: via INTRAMUSCULAR

## 2020-07-22 MED FILL — KETOROLAC TROMETHAMINE 60 MG/2 ML IM: 60 mg/2 mL | INTRAMUSCULAR | Qty: 2

## 2020-07-22 MED FILL — LORAZEPAM 2 MG/ML IJ SOLN: 2 mg/mL | INTRAMUSCULAR | Qty: 1

## 2020-07-22 MED FILL — HYDROCODONE-ACETAMINOPHEN 5 MG-325 MG TAB: 5-325 mg | ORAL | Qty: 1

## 2020-09-18 ENCOUNTER — Other Ambulatory Visit: Payer: Self-pay | Admitting: Radiation Oncology

## 2020-09-18 ENCOUNTER — Telehealth: Payer: Self-pay | Admitting: Radiology

## 2020-09-18 DIAGNOSIS — F418 Other specified anxiety disorders: Secondary | ICD-10-CM

## 2020-09-18 MED ORDER — ALPRAZOLAM 0.25 MG PO TABS: 0.1250 mg | ORAL_TABLET | Freq: Two times a day (BID) | ORAL | 0 refills | Status: DC | PRN

## 2020-09-18 NOTE — Telephone Encounter (Signed)
Patient requests refill of ALPRAZolam (XANAX) 0.25 MG tablet.

## 2020-09-18 NOTE — Telephone Encounter (Signed)
Notified patient of script sent to pharmacy. 

## 2020-09-23 ENCOUNTER — Ambulatory Visit: Attending: Family | Primary: Internal Medicine

## 2020-09-23 ENCOUNTER — Other Ambulatory Visit: Payer: Self-pay | Admitting: Radiation Oncology

## 2020-09-23 ENCOUNTER — Inpatient Hospital Stay: Admit: 2020-09-23 | Payer: PRIVATE HEALTH INSURANCE | Primary: Internal Medicine

## 2020-09-23 ENCOUNTER — Ambulatory Visit
Admit: 2020-09-23 | Discharge: 2020-09-23 | Payer: PRIVATE HEALTH INSURANCE | Attending: Family | Primary: Internal Medicine

## 2020-09-23 DIAGNOSIS — B182 Chronic viral hepatitis C: Secondary | ICD-10-CM

## 2020-09-23 DIAGNOSIS — F418 Other specified anxiety disorders: Secondary | ICD-10-CM

## 2020-09-23 LAB — CBC WITH AUTO DIFFERENTIAL
Basophils %: 0 % (ref 0–2)
Basophils Absolute: 0 10*3/uL (ref 0.0–0.1)
Eosinophils %: 2 % (ref 0–5)
Eosinophils Absolute: 0.1 10*3/uL (ref 0.0–0.4)
Granulocyte Absolute Count: 0 10*3/uL (ref 0.00–0.04)
Hematocrit: 38.7 % (ref 35.0–45.0)
Hemoglobin: 12.1 g/dL (ref 12.0–16.0)
Immature Granulocytes: 0 % (ref 0.0–0.5)
Lymphocytes %: 34 % (ref 21–52)
Lymphocytes Absolute: 2.3 10*3/uL (ref 0.9–3.6)
MCH: 27.9 PG (ref 24.0–34.0)
MCHC: 31.3 g/dL (ref 31.0–37.0)
MCV: 89.4 FL (ref 78.0–100.0)
MPV: 10 FL (ref 9.2–11.8)
Monocytes %: 5 % (ref 3–10)
Monocytes Absolute: 0.4 10*3/uL (ref 0.05–1.2)
NRBC Absolute: 0 10*3/uL (ref 0.00–0.01)
Neutrophils %: 57 % (ref 40–73)
Neutrophils Absolute: 3.8 10*3/uL (ref 1.8–8.0)
Nucleated RBCs: 0 PER 100 WBC
Platelets: 297 10*3/uL (ref 135–420)
RBC: 4.33 M/uL (ref 4.20–5.30)
RDW: 13.4 % (ref 11.6–14.5)
WBC: 6.7 10*3/uL (ref 4.6–13.2)

## 2020-09-23 LAB — HEPATIC FUNCTION PANEL
A-G Ratio: 0.9 (ref 0.8–1.7)
ALT (SGPT): 31 U/L (ref 13–56)
ALT: 31 U/L (ref 13–56)
AST (SGOT): 23 U/L (ref 10–38)
AST: 23 U/L (ref 10–38)
Albumin/Globulin Ratio: 0.9 (ref 0.8–1.7)
Albumin: 3.7 g/dL (ref 3.4–5.0)
Albumin: 3.7 g/dL (ref 3.4–5.0)
Alk. phosphatase: 91 U/L (ref 45–117)
Alkaline Phosphatase: 91 U/L (ref 45–117)
Bilirubin, Direct: 0.1 MG/DL (ref 0.0–0.2)
Bilirubin, direct: 0.1 MG/DL (ref 0.0–0.2)
Bilirubin, total: 0.3 MG/DL (ref 0.2–1.0)
Globulin: 4.2 g/dL — ABNORMAL HIGH (ref 2.0–4.0)
Globulin: 4.2 g/dL — ABNORMAL HIGH (ref 2.0–4.0)
Protein, total: 7.9 g/dL (ref 6.4–8.2)
Total Bilirubin: 0.3 MG/DL (ref 0.2–1.0)
Total Protein: 7.9 g/dL (ref 6.4–8.2)

## 2020-09-23 LAB — BASIC METABOLIC PANEL
Anion Gap: 4 mmol/L (ref 3.0–18)
BUN: 15 MG/DL (ref 7.0–18)
Bun/Cre Ratio: 16 (ref 12–20)
CO2: 35 mmol/L — ABNORMAL HIGH (ref 21–32)
Calcium: 9 MG/DL (ref 8.5–10.1)
Chloride: 99 mmol/L — ABNORMAL LOW (ref 100–111)
Creatinine: 0.93 MG/DL (ref 0.6–1.3)
EGFR IF NonAfrican American: >60 mL/min/{1.73_m2} (ref >60)
GFR African American: >60 mL/min/{1.73_m2} (ref >60)
Glucose: 101 mg/dL — ABNORMAL HIGH (ref 74–99)
Potassium: 3.9 mmol/L (ref 3.5–5.5)
Sodium: 138 mmol/L (ref 136–145)

## 2020-09-23 LAB — CBC WITH AUTOMATED DIFF
ABS. BASOPHILS: 0 10*3/uL (ref 0.0–0.1)
ABS. EOSINOPHILS: 0.1 10*3/uL (ref 0.0–0.4)
ABS. IMM. GRANS.: 0 10*3/uL (ref 0.00–0.04)
ABS. LYMPHOCYTES: 2.3 10*3/uL (ref 0.9–3.6)
ABS. MONOCYTES: 0.4 10*3/uL (ref 0.05–1.2)
ABS. NEUTROPHILS: 3.8 10*3/uL (ref 1.8–8.0)
ABSOLUTE NRBC: 0 10*3/uL (ref 0.00–0.01)
BASOPHILS: 0 % (ref 0–2)
EOSINOPHILS: 2 % (ref 0–5)
HCT: 38.7 % (ref 35.0–45.0)
HGB: 12.1 g/dL (ref 12.0–16.0)
IMMATURE GRANULOCYTES: 0 % (ref 0.0–0.5)
LYMPHOCYTES: 34 % (ref 21–52)
MCH: 27.9 PG (ref 24.0–34.0)
MCHC: 31.3 g/dL (ref 31.0–37.0)
MCV: 89.4 FL (ref 78.0–100.0)
MONOCYTES: 5 % (ref 3–10)
MPV: 10 FL (ref 9.2–11.8)
NEUTROPHILS: 57 % (ref 40–73)
NRBC: 0 PER 100 WBC
PLATELET: 297 10*3/uL (ref 135–420)
RBC: 4.33 M/uL (ref 4.20–5.30)
RDW: 13.4 % (ref 11.6–14.5)
WBC: 6.7 10*3/uL (ref 4.6–13.2)

## 2020-09-23 LAB — METABOLIC PANEL, BASIC
Anion gap: 4 mmol/L (ref 3.0–18)
BUN/Creatinine ratio: 16 (ref 12–20)
BUN: 15 MG/DL (ref 7.0–18)
CO2: 35 mmol/L — ABNORMAL HIGH (ref 21–32)
Calcium: 9 MG/DL (ref 8.5–10.1)
Chloride: 99 mmol/L — ABNORMAL LOW (ref 100–111)
Creatinine: 0.93 MG/DL (ref 0.6–1.3)
GFR est AA: >60 mL/min/{1.73_m2} (ref >60)
GFR est non-AA: >60 mL/min/{1.73_m2} (ref >60)
Glucose: 101 mg/dL — ABNORMAL HIGH (ref 74–99)
Potassium: 3.9 mmol/L (ref 3.5–5.5)
Sodium: 138 mmol/L (ref 136–145)

## 2020-09-23 MED ORDER — MAVYRET 100 MG-40 MG TABLET
100-40 mg | ORAL_TABLET | Freq: Every day | ORAL | 1 refills | Status: AC
Start: 2020-09-23 — End: 2020-11-18
  Filled 2020-10-13: qty 84, 28d supply, fill #0

## 2020-09-23 MED ORDER — ALPRAZOLAM 0.25 MG PO TABS: 0.1250 mg | ORAL_TABLET | Freq: Two times a day (BID) | ORAL | 0 refills | Status: DC | PRN

## 2020-09-23 NOTE — Progress Notes (Signed)
[place], MD, [place], [place], [place]      Arlyss Queen, PA-C    Genella Rife, ACNP-BC     April S Ashworth, AGPCNP-BC   Demetrios Isaacs, FNP-C    Marlyce Huge, AGPCNP-BC       [place]    at [facility]    [address] [place]  [address]    [phone]    FAX: [place]    at [facility]    [address], [place]  [address]    [phone]    FAX: [phone]       Patient Care Team:  Hewitt Shorts, MD as PCP - General (Internal Medicine Physician)  [name], MD (Radiation Oncology)      Problem List  Date Reviewed: Mar 12, 2020          Codes Class Noted    Hepatitis C antibody test positive ICD-10-CM: R76.8  ICD-9-CM: 795.79  03/12/2020        HTN (hypertension) ICD-10-CM: I10  ICD-9-CM: 401.9  12-Mar-2020        GERD (gastroesophageal reflux disease) ICD-10-CM: K21.9  ICD-9-CM: 530.81  2020/03/12        Neck pain ICD-10-CM: M54.2  ICD-9-CM: 723.1  12/31/2012        Encounter for long-term (current) use of other medications ICD-10-CM: Z79.899  ICD-9-CM: V58.69  12/31/2012        Chronic pain syndrome ICD-10-CM: G89.4  ICD-9-CM: 338.4  12/31/2012        GAD (generalized anxiety disorder) ICD-10-CM: F41.1  ICD-9-CM: 300.02  12/31/2012        DJD (degenerative joint disease), cervical ICD-10-CM: M84.132  ICD-9-CM: 721.0  12/31/2012        History of cervical spinal surgery ICD-10-CM: Z98.890  ICD-9-CM: V45.89  12/31/2012              Erica Zamora returns to the [facility] today for fibroscan assessment of hepatic fibrosis and for education and management of chronic hepatitis C.      The active problem list, all pertinent past medical history, medications, liver histology, endoscopic studies, radiologic findings and laboratory findings related to  the liver disorder were reviewed with the patient.     The patient is a 60 y.o. Black female who was screened for anti-HCV and tested positive during birth cohort screening in 11/2019.      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        There was no history of acute icteric hepatitis at the time of these risk factors.      CT scan of the liver was performed in 07/2017.    The results of the imaging demonstrated a normal appearing liver.      An assessment of liver fibrosis with fibroscan was performed in the office during this appointment.  Results of the scan indicate mild hepatic fibrosis and a fatty liver.  The suggested Metavir fibrosis score is F1.    The patient has never received treatment for chronic HCV.      The patient has no complaints which can be attributed to liver disease.  The moderate limitations in functional activities due to medical issues other than liver disease.      The patient has not experienced fatigue, fevers, chills, shortness of breath, chest pain, pain in the right side over the liver, diffuse abdominal pain, nausea, vomiting, constipation, diarrhrea, dry eyes, dry mouth, arthralgias, myalgias, yellowing of the eyes or skin, itching, dark urine, problems concentrating, swelling of the abdomen, swelling of the lower extremities, hematemesis, or hematochezia.    ASSESSMENT AND PLAN:  Chronic HCV   Chronic HCV with normal to mild liver stiffness.     The most recent laboratory studies indicate that the liver transaminases are normal, alkaline phosphatase is normal, tests of hepatic synthetic and metabolic function are normal, and the platelet count is normal.      The need to perform an assessment of liver fibrosis was discussed with the patient.  The Fibroscan can assess liver fibrosis and determine if a patient has advanced fibrosis or cirrhosis without the need for liver biopsy.  This was performed in the office during  today's appointment.    Results of the scan indicate mild hepatic fibrosis and a fatty liver.  The suggested Metavir fibrosis score is F1.    The Fibroscan can be repeated annually or as often as clinically indicated to assess for fibrosis progression and/or regression.    Chronic HCV Treatment  The patient is treatment naive and has genotype 1b.    The patient prefers the shorter duration of treatment offered by Mavyret.  Mavyret is a pan genotypic treatment regime utilizing glecaprevir (an NS3/4A protease inhibitor) and pibrentasvir (an NS5A inhibitor) for 8 weeks.  I explained that she is to take three tabs daily in a single dose with food.   This treatment regime was explained in detail, including dosing and side effects.  Educational material was provided.   Mavyret was ordered during this visit via [facility] in [place].      Screening for Hepatocellular Carcinoma  HCC screening: AFP was ordered today and ultrasound will be scheduled.    Treatment of other medical problems in patients with chronic liver disease  There are no contraindications for the patient to take most medications that are necessary for treatment of other medical issues.  The patient can take any medications utilized for treatment of DM and/or statins to treat hypercholesterolemia  Normal doses of acetaminophen, as recommended on the label of the bottle, are not hepatotoxic except in the setting of daily alcohol use, even in patients with cirrhosis and can be utilized for pain.    Counseling for alcohol in patients with chronic liver disease  The patient was counseled regarding alcohol consumption and the effect of alcohol on chronic liver disease.  The patient has cirrhosis and was advised to be abstinent from all alcohol including non-alcoholic beer which does contain some alcohol.  The patient does not consume any significant amount of alcohol.    Substance Use  The patient was counseled regarding the risk of overdose and death  from using opioids and other narcotic drugs.  Discussed the risk of becoming reinfected with HCV once they are cured if they resume IV drug use or inhaling drugs nasally.  The patient does not use drugs.    There is no contraindication to treating HCV in patients who are actively using drugs and the SVR/cure rate is the same as persons who no not use drugs.    Vaccinations  Vaccination for viral hepatitis A is recommended.  The  patient does not have serologic evidence of prior exposure or vaccination with immunity.   Vaccination for viral hepatitis B is not required.  The patient has serologic evidence of prior exposure or vaccination with immunity.  The patient has not received  COVID-19 vaccine.    The patient was encouraged to take the COVID-19 vaccine.  Routine vaccinations against other bacterial and viral agents can be performed as indicated.  Annual flu vaccination should be administered if indicated.      ALLERGIES  Allergies   Allergen Reactions   ??? Fentanyl Itching, Swelling and Other (comments)     Burning     ??? Ultram [Tramadol] Nausea Only       MEDICATIONS  Current Outpatient Medications   Medication Sig   ??? ALPRAZolam (XANAX) 0.25 mg tablet    ??? HYDROmorphone (DILAUDID) 4 mg tablet Take 4 mg by mouth every six (6) hours as needed.   ??? methadone 10 mg/5 mL solution Take 70 mg by mouth daily.   ??? omeprazole (PRILOSEC) 20 mg capsule    ??? cetirizine (ZYRTEC) 10 mg tablet Take 1 Tablet by mouth daily.   ??? levocetirizine (XYZAL) 5 mg tablet Take 5 mg by mouth daily.   ??? lidocaine (LIDODERM) 5 %    ??? hydrOXYzine (ATARAX) 50 mg tablet Take 50 mg by mouth three (3) times daily as needed for Itching.   ??? lisinopril-hydrochlorothiazide (PRINZIDE, ZESTORETIC) 10-12.5 mg per tablet Take  by mouth daily.     No current facility-administered medications for this visit.       SYSTEM REVIEW NOT RELATED TO LIVER DISEASE OR REVIEWED ABOVE:  Constitution systems: Negative for fever, chills, weight gain, weight loss.    Eyes: Negative for visual changes.  ENT: Negative for sore throat, painful swallowing.   Respiratory: Negative for cough, hemoptysis, SOB.   Cardiology: Negative for chest pain, palpitations.  GI:  Negative for constipation or diarrhea.  GU: Negative for urinary frequency, dysuria, hematuria, nocturia.   Skin: Negative for rash.  Hematology: Negative for easy bruising, blood clots.    Musculo-skelatal: Negative for back pain, muscle pain, weakness.  Neurologic: Negative for headaches, dizziness, vertigo, memory problems not related to HE.  Psychology: Negative for anxiety, depression.     FAMILY HISTORY:  The father died from "cancer" at age "50 something".   The mother died of dementia at age 43.    Spouse "died from liver disease among other things".    There is no family history of immune disorders.    SOCIAL HISTORY:  The patient is widowed.    Spouse died "liver disease among other things".    The patient has 5 children and 9 grandchildren and one great grandson.   The patient currently smokes "4 to 5 cigarettes a day".    The patient has never consumed significant amounts of alcohol.    The patient has been abstinent from alcohol since "it's been a long time".   The patient is currently receiving disability.      PHYSICAL EXAMINATION:  Visit Vitals  BP 126/73   Pulse 97   Temp 98.4 ??F (36.9 ??C)   Ht 5' 1.5" (1.562 m)   Wt 172 lb (78 kg)   SpO2 97%   BMI 31.97 kg/m??     General: No acute distress.   Eyes: Sclera anicteric.   ENT: No oral lesions.  Thyroid normal.  Nodes: No adenopathy.   Skin: No spider angiomata.  No jaundice.  No palmar  erythema.  Respiratory: Lungs clear to auscultation.   Cardiovascular: Regular heart rate.  No murmurs.  No JVD.  Abdomen: Soft non-tender.  Liver size normal to percussion/palpation.  Spleen not palpable. No obvious ascites.  Extremities: No edema.  No muscle wasting.  No gross arthritic changes.  Neurologic: Alert and oriented.  Cranial nerves grossly intact.  No  asterixis.    LABORATORY STUDIES:  [facility] Units 09/23/2020 02/24/2020   WBC 4.6 - 13.2 K/uL 6.7 7.6   ANC 1.8 - 8.0 K/UL 3.8 5.8   HGB 12.0 - 16.0 g/dL 12.1 12.8   PLT 135 - 420 K/uL 297 313   INR 0.8 - 1.2    1.0   AST 10 - 38 U/L 23    ALT 13 - 56 U/L 31    Alk Phos 45 - 117 U/L 91    Bili, Total 0.2 - 1.0 MG/DL 0.3    Bili, Direct 0.0 - 0.2 MG/DL 0.1    Albumin 3.4 - 5.0 g/dL 3.7    BUN 7.0 - 18 MG/DL 15 23 (H)   Creat 0.6 - 1.3 MG/DL 0.93 1.01   Na 136 - 145 mmol/L 138 135 (L)   K 3.5 - 5.5 mmol/L 3.9 4.1   Cl 100 - 111 mmol/L 99 (L) 98 (L)   CO2 21 - 32 mmol/L 35 (H) 29   Glucose 74 - 99 mg/dL 101 (H) 186 (H)     SEROLOGIES:  [facility] Latest Ref Rng & Units 02/24/2020   Hep A Ab, Total Negative   Negative   Hep B Core Ab, Total Negative   Positive (A)   Hep B Surface Ab >10.0 mIU/mL 200.10   Hep B Surface Ab Interp POS   Positive   Hep C Genotype  1b   HCV RT-PCR, Quant IU/mL 339,000   Ferritin 8 - 388 NG/ML 175   Iron % Saturation 20 - 50 % 31       LIVER HISTOLOGY:  08/2020.  FibroScan performed at [facility] [place]. EkPa was 6.9.  IQR/med 14%.  CAP 304.   The results suggested a fibrosis level of F1.  The CAP score suggests there is hepatic steatosis.      ENDOSCOPIC PROCEDURES:  Not available or performed    RADIOLOGY:  02/2020.  Abdominal ultrasound.  Mildly coarsened echotexture. No focal mass.      OTHER TESTING:  Not available or performed    FOLLOW-UP:  All of the issues listed above in the Assessment and Plan were discussed with the patient.  All questions were answered.  The patient expressed a clear understanding of the above.    [place] in 8 weeks to assess response to antiviral drug therapy.      Jaclynn Major, FNP-C  [facility]  [place], [place] [address]   [phone]

## 2020-09-25 LAB — HCV RT-PCR, QUANT (NON-GRAPH)
HCV IU/mL: 647864 HCVIU/mL — AB
HCV log 10 IU/mL: 5.81 HCV log 10IU/Ml — AB
HCV log 10: 5.81 HCV log 10IU/Ml — AB
HCV, IU/mL: 647864 HCVIU/mL — AB

## 2020-11-02 ENCOUNTER — Ambulatory Visit: Payer: Medicare Other | Admitting: Radiation Oncology

## 2020-11-03 MED FILL — MAVYRET 100-40 MG TABLETS (SRX): 100-40 mg | ORAL | 28 days supply | Qty: 84 | Fill #1 | Status: AC

## 2020-11-06 NOTE — Progress Notes (Signed)
Radiation Oncology         ([phone]) [phone] ________________________________  Name: Valerie Kerr MRN: [id number]  Date: 11/09/2020  DOB: May 31, 1960  Follow-Up Visit Note  CC: Charlott Rakes, MD  Charlott Rakes, MD    ICD-10-CM   1. 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   Interval Since Last Radiation:  4 years, 4 months, and 6 days  04/12/2016 - 05/31/2016: 1) Pelvis/ 45 Gy in 25 fractions, 3D/15X 2) pelvic Boost/ 9 Gy in 5 fractions, Isodose Plan/15X   06/20/2016 - 07/04/2016: Cervix/ 28 Gy in 5 fractions, HDR Ir-192 (Tandem/Ring)  Narrative:  The patient returns today for routine follow-up, she was last seen here on 02/10/20.   Pertinent imaging since the patient was last seen includes a CT of the abdomen and pelvis on 02/16/21. Findings revealed no evidence of intraabdominal or intrapelvic metastasis.  She was noted to have small pelvic fractures which is likely explaining her chronic pelvic pain.  The patient followed up with Dr. [name] on 05/18/20. During this visit, results from her most recent pap were discussed; (pap on 02/10/20 revealed ASCUS-HPV positive). Pap performed during this visit revealed no evidence of dysplasia or invasive carcinoma.  OF note: the patient presented to the [facility] ED on 07/21/20 with complaints of acute on chronic back pain. Patient stated that she recently met with her orthopedist who prescribed a steroid which she finished without relief of symptoms. Additionally, she stated that she had been having tingling numbness/weakness to her the lower extremities. MRI taken during ED encounter demonstrated findings consistent with her chronic back conditions and no evidence of cord compression.                               Allergies:  is allergic to codeine,  fentanyl, hydrocodone-acetaminophen, and tramadol.  Meds: Current Outpatient Medications  Medication Sig Dispense Refill   ALPRAZolam (XANAX) 0.25 MG tablet Take 0.5 tablets (0.125 mg total) by mouth 2 (two) times daily as needed for anxiety. 30 tablet 0   cetirizine (ZYRTEC) 10 MG tablet TAKE 1 TABLET BY MOUTH EVERY DAY 90 tablet 0   fluticasone (FLONASE) 50 MCG/ACT nasal spray INSTILL 1 SPRAY INTO EACH NOSTRIL DAILY 48 mL 0   levocetirizine (XYZAL) 5 MG tablet Take 5 mg by mouth at bedtime.     lidocaine (LIDODERM) 5 % Place 1 patch onto the skin daily. Remove & Discard patch within 12 hours or as directed by MD 30 patch 0   lisinopril-hydrochlorothiazide (ZESTORETIC) 20-25 MG tablet Take 1 tablet by mouth daily. 30 tablet 0   methadone (DOLOPHINE) 10 MG/5ML solution Take 70 mg by mouth daily in the afternoon.     omeprazole (PRILOSEC) 20 MG capsule Take 1 capsule (20 mg total) by mouth daily. Must have office visit for refills 90 capsule 0   prochlorperazine (COMPAZINE) 10 MG tablet Take 1 tablet (10 mg total) by mouth every 6 (six) hours as needed for nausea. 30 tablet 0   Vitamin D, Ergocalciferol, (DRISDOL) 50000 units CAPS capsule Take 1 capsule (50,000 Units total) by mouth every 7 (seven) days. (Patient taking differently: Take 50,000 Units by mouth every Saturday.) 16 capsule 0   VOLTAREN 1 % GEL Apply 4 g topically 4 (four) times daily.  100 g 0   cyclobenzaprine (FLEXERIL) 10 MG tablet Take by mouth. (Patient not taking: Reported on 11/09/2020)     No current facility-administered medications for this encounter.    Physical Findings: The patient is in no acute distress. Patient is alert and oriented.  height is _0  (1.549 m) and weight is 165 lb 6.4 oz (75 kg). Her temperature is 98.6 F (37 C). Her blood pressure is 106/57 (abnormal) and her pulse is 106 (abnormal). Her respiration is 20 and oxygen saturation is 98%. .  No significant changes. Lungs are clear to auscultation  bilaterally. Heart has regular rate and rhythm. No palpable cervical, supraclavicular, or axillary adenopathy. Abdomen soft, non-tender, normal bowel sounds.  On pelvic examination the external genitalia were unremarkable. A speculum exam was performed. There are no mucosal lesions noted in the vaginal vault. A Pap smear was obtained of the  cervical os. On bimanual and rectovaginal examination there were no pelvic masses appreciated.     Lab Findings: Lab Results  Component Value Date   WBC 7.9 02/10/2020   HGB 12.3 02/10/2020   HCT 39.2 02/10/2020   MCV 89.7 02/10/2020   PLT 265 02/10/2020    Radiographic Findings: No results found.  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   No evidence of recurrent on physical exam today.  Pap smear pending  Plan: Routine follow-up in 1 year.  Patient will follow-up with gynecologic oncology in 6 months.   25 minutes of total time was spent for this patient encounter, including preparation, face-to-face counseling with the patient and coordination of care, physical exam, and documentation of the encounter. ____________________________________  [name], PhD, MD   This document serves as a record of services personally performed by [name], MD. It was created on his behalf by Roney Mans, a trained medical scribe. The creation of this record is based on the scribe's personal observations and the provider's statements to them. This document has been checked and approved by the attending provider.

## 2020-11-07 LAB — HEMOGLOBIN A1C
Estimated Avg Glucose, External: 148 mg/dL — ABNORMAL HIGH (ref 91–123)
Hemoglobin A1C, External: 6.8 % — ABNORMAL HIGH (ref 4.8–5.6)

## 2020-11-09 ENCOUNTER — Other Ambulatory Visit: Payer: Self-pay

## 2020-11-09 ENCOUNTER — Ambulatory Visit
Admission: RE | Admit: 2020-11-09 | Discharge: 2020-11-09 | Disposition: A | Discharge status: Home / Self Care | Payer: Medicare (Managed Care) | Source: Ambulatory Visit | Attending: Radiation Oncology | Admitting: Radiation Oncology

## 2020-11-09 ENCOUNTER — Other Ambulatory Visit (HOSPITAL_COMMUNITY)
Admission: RE | Admit: 2020-11-09 | Discharge: 2020-11-09 | Disposition: A | Discharge status: Home / Self Care | Payer: Medicare (Managed Care) | Source: Ambulatory Visit | Attending: Radiation Oncology | Admitting: Radiation Oncology

## 2020-11-09 ENCOUNTER — Ambulatory Visit: Payer: Medicare (Managed Care) | Admitting: Radiation Oncology

## 2020-11-09 ENCOUNTER — Telehealth: Payer: Self-pay

## 2020-11-09 ENCOUNTER — Encounter: Payer: Self-pay | Admitting: Radiation Oncology

## 2020-11-09 DIAGNOSIS — C530 Malignant neoplasm of endocervix: Secondary | ICD-10-CM | POA: Insufficient documentation

## 2020-11-09 DIAGNOSIS — Z79899 Other long term (current) drug therapy: Secondary | ICD-10-CM | POA: Insufficient documentation

## 2020-11-09 DIAGNOSIS — R531 Weakness: Secondary | ICD-10-CM | POA: Insufficient documentation

## 2020-11-09 DIAGNOSIS — R200 Anesthesia of skin: Secondary | ICD-10-CM | POA: Diagnosis not present

## 2020-11-09 DIAGNOSIS — Z923 Personal history of irradiation: Secondary | ICD-10-CM | POA: Insufficient documentation

## 2020-11-09 DIAGNOSIS — Z9221 Personal history of antineoplastic chemotherapy: Secondary | ICD-10-CM | POA: Diagnosis not present

## 2020-11-09 DIAGNOSIS — Z8541 Personal history of malignant neoplasm of cervix uteri: Secondary | ICD-10-CM | POA: Diagnosis present

## 2020-11-09 DIAGNOSIS — R202 Paresthesia of skin: Secondary | ICD-10-CM | POA: Insufficient documentation

## 2020-11-09 DIAGNOSIS — M84454A Pathological fracture, pelvis, initial encounter for fracture: Secondary | ICD-10-CM | POA: Diagnosis not present

## 2020-11-09 DIAGNOSIS — G8929 Other chronic pain: Secondary | ICD-10-CM | POA: Diagnosis not present

## 2020-11-09 DIAGNOSIS — F418 Other specified anxiety disorders: Secondary | ICD-10-CM

## 2020-11-09 DIAGNOSIS — C531 Malignant neoplasm of exocervix: Secondary | ICD-10-CM

## 2020-11-09 MED ORDER — ALPRAZOLAM 0.25 MG PO TABS
0.1250 mg | ORAL_TABLET | Freq: Two times a day (BID) | ORAL | 0 refills | Status: DC | PRN
Start: 2020-11-09 — End: 2020-12-31

## 2020-11-09 MED ORDER — PROCHLORPERAZINE MALEATE 10 MG PO TABS: 10.0000 mg | ORAL_TABLET | Freq: Four times a day (QID) | ORAL | 0 refills | Status: DC | PRN

## 2020-11-09 NOTE — Progress Notes (Signed)
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  They completed their radiation on: 07/04/16  Does the patient complain of any of the following:  Pain:Patient reports having severe pain to bilateral lower extremities. Patient also reports having a headache.  Abdominal bloating: no Diarrhea/Constipation: constipation Nausea/Vomiting: no Vaginal Discharge: no Blood in Urine or Stool: no Urinary Issues (dysuria/incomplete emptying/ incontinence/ increased frequency/urgency): no Does patient report using vaginal dilator 2-3 times a week and/or sexually active 2-3 weeks: no Post radiation skin changes: no   Additional comments if applicable:   Vitals:   11/09/20 0930  BP: (!) 106/57  Pulse: (!) 106  Resp: 20  Temp: 98.6 F (37 C)  SpO2: 98%  Weight: 165 lb 6.4 oz (75 kg)  Height: '5\' 1"'$  (1.549 m)

## 2020-11-09 NOTE — Telephone Encounter (Signed)
Patient requesting refill on compazine, patient requesting medication be sent to [facility] in [facility]

## 2020-11-11 LAB — CYTOLOGY - PAP
Diagnosis: NEGATIVE
Diagnosis: REACTIVE

## 2020-11-19 ENCOUNTER — Ambulatory Visit: Attending: Family | Primary: Internal Medicine

## 2020-11-19 ENCOUNTER — Inpatient Hospital Stay: Admit: 2020-11-19 | Payer: PRIVATE HEALTH INSURANCE | Primary: Internal Medicine

## 2020-11-19 ENCOUNTER — Ambulatory Visit
Admit: 2020-11-19 | Discharge: 2020-11-19 | Payer: PRIVATE HEALTH INSURANCE | Attending: Family | Primary: Internal Medicine

## 2020-11-19 DIAGNOSIS — B182 Chronic viral hepatitis C: Secondary | ICD-10-CM

## 2020-11-19 LAB — CBC WITH AUTO DIFFERENTIAL
Basophils %: 0 % (ref 0–2)
Basophils Absolute: 0 10*3/uL (ref 0.0–0.1)
Eosinophils %: 3 % (ref 0–5)
Eosinophils Absolute: 0.2 10*3/uL (ref 0.0–0.4)
Granulocyte Absolute Count: 0 10*3/uL (ref 0.00–0.04)
Hematocrit: 38.9 % (ref 35.0–45.0)
Hemoglobin: 11.8 g/dL — ABNORMAL LOW (ref 12.0–16.0)
Immature Granulocytes: 0 % (ref 0.0–0.5)
Lymphocytes %: 29 % (ref 21–52)
Lymphocytes Absolute: 2.1 10*3/uL (ref 0.9–3.6)
MCH: 27.8 PG (ref 24.0–34.0)
MCHC: 30.3 g/dL — ABNORMAL LOW (ref 31.0–37.0)
MCV: 91.5 FL (ref 78.0–100.0)
MPV: 10.5 FL (ref 9.2–11.8)
Monocytes %: 4 % (ref 3–10)
Monocytes Absolute: 0.3 10*3/uL (ref 0.05–1.2)
NRBC Absolute: 0 10*3/uL (ref 0.00–0.01)
Neutrophils %: 63 % (ref 40–73)
Neutrophils Absolute: 4.5 10*3/uL (ref 1.8–8.0)
Nucleated RBCs: 0 PER 100 WBC
Platelets: 246 10*3/uL (ref 135–420)
RBC: 4.25 M/uL (ref 4.20–5.30)
RDW: 13.7 % (ref 11.6–14.5)
WBC: 7.1 10*3/uL (ref 4.6–13.2)

## 2020-11-19 LAB — BASIC METABOLIC PANEL
Anion Gap: 3 mmol/L (ref 3.0–18)
BUN: 24 MG/DL — ABNORMAL HIGH (ref 7.0–18)
Bun/Cre Ratio: 19 (ref 12–20)
CO2: 33 mmol/L — ABNORMAL HIGH (ref 21–32)
Calcium: 9.1 MG/DL (ref 8.5–10.1)
Chloride: 104 mmol/L (ref 100–111)
Creatinine: 1.27 MG/DL (ref 0.6–1.3)
EGFR IF NonAfrican American: 43 mL/min/{1.73_m2} — ABNORMAL LOW (ref >60)
GFR African American: 52 mL/min/{1.73_m2} — ABNORMAL LOW (ref >60)
Glucose: 156 mg/dL — ABNORMAL HIGH (ref 74–99)
Potassium: 3.7 mmol/L (ref 3.5–5.5)
Sodium: 140 mmol/L (ref 136–145)

## 2020-11-19 LAB — HEPATIC FUNCTION PANEL
A-G Ratio: 0.8 (ref 0.8–1.7)
ALT (SGPT): 20 U/L (ref 13–56)
ALT: 20 U/L (ref 13–56)
AST (SGOT): 12 U/L (ref 10–38)
AST: 12 U/L (ref 10–38)
Albumin/Globulin Ratio: 0.8 (ref 0.8–1.7)
Albumin: 3.3 g/dL — ABNORMAL LOW (ref 3.4–5.0)
Albumin: 3.3 g/dL — ABNORMAL LOW (ref 3.4–5.0)
Alk. phosphatase: 126 U/L — ABNORMAL HIGH (ref 45–117)
Alkaline Phosphatase: 126 U/L — ABNORMAL HIGH (ref 45–117)
Bilirubin, Direct: 0.1 MG/DL (ref 0.0–0.2)
Bilirubin, direct: 0.1 MG/DL (ref 0.0–0.2)
Bilirubin, total: 0.2 MG/DL (ref 0.2–1.0)
Globulin: 4.3 g/dL — ABNORMAL HIGH (ref 2.0–4.0)
Globulin: 4.3 g/dL — ABNORMAL HIGH (ref 2.0–4.0)
Protein, total: 7.6 g/dL (ref 6.4–8.2)
Total Bilirubin: 0.2 MG/DL (ref 0.2–1.0)
Total Protein: 7.6 g/dL (ref 6.4–8.2)

## 2020-11-19 LAB — METABOLIC PANEL, BASIC
Anion gap: 3 mmol/L (ref 3.0–18)
BUN/Creatinine ratio: 19 (ref 12–20)
BUN: 24 MG/DL — ABNORMAL HIGH (ref 7.0–18)
CO2: 33 mmol/L — ABNORMAL HIGH (ref 21–32)
Calcium: 9.1 MG/DL (ref 8.5–10.1)
Chloride: 104 mmol/L (ref 100–111)
Creatinine: 1.27 MG/DL (ref 0.6–1.3)
GFR est AA: 52 mL/min/{1.73_m2} — ABNORMAL LOW (ref >60)
GFR est non-AA: 43 mL/min/{1.73_m2} — ABNORMAL LOW (ref >60)
Glucose: 156 mg/dL — ABNORMAL HIGH (ref 74–99)
Potassium: 3.7 mmol/L (ref 3.5–5.5)
Sodium: 140 mmol/L (ref 136–145)

## 2020-11-19 LAB — CBC WITH AUTOMATED DIFF
ABS. BASOPHILS: 0 10*3/uL (ref 0.0–0.1)
ABS. EOSINOPHILS: 0.2 10*3/uL (ref 0.0–0.4)
ABS. IMM. GRANS.: 0 10*3/uL (ref 0.00–0.04)
ABS. LYMPHOCYTES: 2.1 10*3/uL (ref 0.9–3.6)
ABS. MONOCYTES: 0.3 10*3/uL (ref 0.05–1.2)
ABS. NEUTROPHILS: 4.5 10*3/uL (ref 1.8–8.0)
ABSOLUTE NRBC: 0 10*3/uL (ref 0.00–0.01)
BASOPHILS: 0 % (ref 0–2)
EOSINOPHILS: 3 % (ref 0–5)
HCT: 38.9 % (ref 35.0–45.0)
HGB: 11.8 g/dL — ABNORMAL LOW (ref 12.0–16.0)
IMMATURE GRANULOCYTES: 0 % (ref 0.0–0.5)
LYMPHOCYTES: 29 % (ref 21–52)
MCH: 27.8 PG (ref 24.0–34.0)
MCHC: 30.3 g/dL — ABNORMAL LOW (ref 31.0–37.0)
MCV: 91.5 FL (ref 78.0–100.0)
MONOCYTES: 4 % (ref 3–10)
MPV: 10.5 FL (ref 9.2–11.8)
NEUTROPHILS: 63 % (ref 40–73)
NRBC: 0 PER 100 WBC
PLATELET: 246 10*3/uL (ref 135–420)
RBC: 4.25 M/uL (ref 4.20–5.30)
RDW: 13.7 % (ref 11.6–14.5)
WBC: 7.1 10*3/uL (ref 4.6–13.2)

## 2020-11-19 NOTE — Progress Notes (Signed)
[place], MD, [place], [place], [place]      Arlyss Queen, PA-C    Genella Rife, ACNP-BC     April S Ashworth, AGPCNP-BC   Demetrios Isaacs, FNP-C    Marlyce Huge, AGPCNP-BC       [place]    at [facility]    [address] [place]  [address]    [phone]    FAX: [place]    at [facility]    [address], [place]  [address]    [phone]    FAX: [phone]       Patient Care Team:  Hewitt Shorts, MD as PCP - General (Internal Medicine Physician)  [name], MD (Radiation Oncology)      Problem List  Date Reviewed: 04-Dec-2020            Codes Class Noted    Hep C w/o coma, chronic ([place]) ICD-10-CM: B18.2  ICD-9-CM: 070.54  09/23/2020        HTN (hypertension) ICD-10-CM: I10  ICD-9-CM: 401.9  02/24/2020        GERD (gastroesophageal reflux disease) ICD-10-CM: K21.9  ICD-9-CM: 530.81  02/24/2020        Neck pain ICD-10-CM: M54.2  ICD-9-CM: 723.1  12/31/2012        Encounter for long-term (current) use of other medications ICD-10-CM: Z79.899  ICD-9-CM: V58.69  12/31/2012        Chronic pain syndrome ICD-10-CM: G89.4  ICD-9-CM: 338.4  12/31/2012        GAD (generalized anxiety disorder) ICD-10-CM: F41.1  ICD-9-CM: 300.02  12/31/2012        DJD (degenerative joint disease), cervical ICD-10-CM: W54.627  ICD-9-CM: 721.0  12/31/2012        History of cervical spinal surgery ICD-10-CM: Z98.890  ICD-9-CM: V45.89  12/31/2012           Erica Zamora returns to the [facility] today for education and management of chronic hepatitis C.  She start antiviral therapy with Mavyret 4 weeks ago.  This is the only time the HCV has ever been treated.      The active problem list, all pertinent past medical history, medications, liver histology, endoscopic studies,  radiologic findings and laboratory findings related to the liver disorder were reviewed with the patient.     The patient is a 60 y.o. Black female who was screened for anti-HCV and tested positive during birth cohort screening in 11/2019.      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        There was no history of acute icteric hepatitis at the time of these risk factors.      CT scan of the liver was performed in 07/2017.    The results of the imaging demonstrated a normal appearing liver.      An assessment of liver fibrosis with fibroscan was performed in the office during the patient's 08/2020 appointment.  Results of the scan indicate mild hepatic fibrosis and a fatty liver.  The suggested Metavir fibrosis score is F1.    The patient has no complaints which can be attributed to liver  disease.    The moderate limitations in functional activities due to medical issues other than liver disease.      The patient has not experienced fatigue, fevers, chills, shortness of breath, chest pain, pain in the right side over the liver, diffuse abdominal pain, nausea, vomiting, constipation, diarrhrea, dry eyes, dry mouth, arthralgias, myalgias, yellowing of the eyes or skin, itching, dark urine, problems concentrating, swelling of the abdomen, swelling of the lower extremities, hematemesis, or hematochezia.    Since the last office appointment:  Started Mavyret 4 weeks ago.      ASSESSMENT AND PLAN:  Chronic HCV   Chronic HCV with normal to mild liver stiffness.     The most recent laboratory studies indicate that the liver transaminases are normal, alkaline phosphatase is elevated, tests of hepatic synthetic and metabolic function are normal, and the platelet count is normal.      The need to perform an assessment of liver fibrosis was discussed with the patient.  The Fibroscan can assess liver fibrosis and determine if a patient has advanced fibrosis or  cirrhosis without the need for liver biopsy.  This was performed in the office during the patient's 08/2020 visit.     Results of the scan indicate mild hepatic fibrosis and a fatty liver.  The suggested Metavir fibrosis score is F1.    The Fibroscan can be repeated annually or as often as clinically indicated to assess for fibrosis progression and/or regression.    Chronic HCV Treatment  The patient is treatment naive and has genotype 1b.    The patient prefers the shorter duration of treatment offered by Mavyret.  Mavyret is a pan genotypic treatment regime utilizing glecaprevir (an NS3/4A protease inhibitor) and pibrentasvir (an NS5A inhibitor) for 8 weeks.  I explained that she is to take three tabs daily in a single dose with food.   She began the 8 week treatment regime "4 weeks ago".  She denies missing any doses of the medication.  She has no treatment related complaints.     Screening for Hepatocellular Carcinoma  HCC screening was recently performed.  There is no indication of emerging HCC.  Further HCC is not indicated because the patient does not have cirrhosis.      Treatment of other medical problems in patients with chronic liver disease  There are no contraindications for the patient to take most medications that are necessary for treatment of other medical issues.  The patient can take any medications utilized for treatment of DM and/or statins to treat hypercholesterolemia  Normal doses of acetaminophen, as recommended on the label of the bottle, are not hepatotoxic except in the setting of daily alcohol use, even in patients with cirrhosis and can be utilized for pain.    Counseling for alcohol in patients with chronic liver disease  The patient was counseled regarding alcohol consumption and the effect of alcohol on chronic liver disease.  The patient has cirrhosis and was advised to be abstinent from all alcohol including non-alcoholic beer which does contain some alcohol.  The patient does not  consume any significant amount of alcohol.    Substance Use  The patient was counseled regarding the risk of overdose and death from using opioids and other narcotic drugs.  Discussed the risk of becoming reinfected with HCV once they are cured if they resume IV drug use or inhaling drugs nasally.  The patient does not use drugs.    There is no contraindication to treating HCV in  patients who are actively using drugs and the SVR/cure rate is the same as persons who no not use drugs.    Vaccinations  Vaccination for viral hepatitis A is recommended.  The patient does not have serologic evidence of prior exposure or vaccination with immunity.   Vaccination for viral hepatitis B is not required.  The patient has serologic evidence of prior exposure or vaccination with immunity.  The patient has not received  COVID-19 vaccine.    The patient was encouraged to take the COVID-19 vaccine.  Routine vaccinations against other bacterial and viral agents can be performed as indicated.  Annual flu vaccination should be administered if indicated.      ALLERGIES  Allergies   Allergen Reactions    Fentanyl Itching, Swelling and Other (comments)     Burning      Ultram [Tramadol] Nausea Only       MEDICATIONS  Current Outpatient Medications   Medication Sig    pravastatin (PRAVACHOL) 10 mg tablet     fluticasone propionate (FLONASE) 50 mcg/actuation nasal spray 1 Spray daily as needed.    ALPRAZolam (XANAX) 0.25 mg tablet     HYDROmorphone (DILAUDID) 4 mg tablet Take 4 mg by mouth every six (6) hours as needed.    methadone 10 mg/5 mL solution Take 70 mg by mouth daily.    omeprazole (PRILOSEC) 20 mg capsule     cetirizine (ZYRTEC) 10 mg tablet Take 1 Tablet by mouth daily.    levocetirizine (XYZAL) 5 mg tablet Take 5 mg by mouth daily.    lidocaine (LIDODERM) 5 %     hydrOXYzine (ATARAX) 50 mg tablet Take 50 mg by mouth three (3) times daily as needed for Itching.    lisinopril-hydrochlorothiazide (PRINZIDE, ZESTORETIC) 10-12.5 mg  per tablet Take  by mouth daily.     No current facility-administered medications for this visit.       SYSTEM REVIEW NOT RELATED TO LIVER DISEASE OR REVIEWED ABOVE:  Constitution systems: Negative for fever, chills, weight gain, weight loss.   Eyes: Negative for visual changes.  ENT: Negative for sore throat, painful swallowing.   Respiratory: Negative for cough, hemoptysis, SOB.   Cardiology: Negative for chest pain, palpitations.  GI:  Negative for constipation or diarrhea.  GU: Negative for urinary frequency, dysuria, hematuria, nocturia.   Skin: Negative for rash.  Hematology: Negative for easy bruising, blood clots.    Musculo-skelatal: Negative for back pain, muscle pain, weakness.  Neurologic: Negative for headaches, dizziness, vertigo, memory problems not related to HE.  Psychology: Negative for anxiety, depression.     FAMILY HISTORY:  The father died from "cancer" at age "23 something".   The mother died of dementia at age 43.    Spouse "died from liver disease among other things".    There is no family history of immune disorders.    SOCIAL HISTORY:  The patient is widowed.    Spouse died "liver disease among other things".    The patient has 5 children and 9 grandchildren and one great grandson.   The patient currently smokes "4 to 5 cigarettes a day".    The patient has never consumed significant amounts of alcohol.    The patient has been abstinent from alcohol since "it's been a long time".   The patient is currently receiving disability.      PHYSICAL EXAMINATION:  Visit Vitals  BP 136/73   Pulse 100   Temp 96.9 ??F (36.1 ??C)   Ht  '5\' 2"'  (1.575 m)   Wt 162 lb (73.5 kg)   SpO2 98%   BMI 29.63 kg/m??     General: No acute distress.   Eyes: Sclera anicteric.   ENT: No oral lesions.  Thyroid normal.  Nodes: No adenopathy.   Skin: No spider angiomata.  No jaundice.  No palmar erythema.  Respiratory: Lungs clear to auscultation.   Cardiovascular: Regular heart rate.  No murmurs.  No JVD.  Abdomen: Soft  non-tender.  Liver size normal to percussion/palpation.  Spleen not palpable. No obvious ascites.  Extremities: No edema.  No muscle wasting.  No gross arthritic changes.  Neurologic: Alert and oriented.  Cranial nerves grossly intact.  No asterixis.    [place] [facility] [place] Units 11/19/2020 09/23/2020   WBC 4.6 - 13.2 K/uL 7.1 6.7   ANC 1.8 - 8.0 K/UL 4.5 3.8   HGB 12.0 - 16.0 g/dL 11.8 (L) 12.1   PLT 135 - 420 K/uL 246 297   INR 0.8 - 1.2       AST 10 - 38 U/L 12 23   ALT 13 - 56 U/L 20 31   Alk Phos 45 - 117 U/L 126 (H) 91   Bili, Total 0.2 - 1.0 MG/DL 0.2 0.3   Bili, Direct 0.0 - 0.2 MG/DL 0.1 0.1   Albumin 3.4 - 5.0 g/dL 3.3 (L) 3.7   BUN 7.0 - 18 MG/DL 24 (H) 15   Creat 0.6 - 1.3 MG/DL 1.27 0.93   Na 136 - 145 mmol/L 140 138   K 3.5 - 5.5 mmol/L 3.7 3.9   Cl 100 - 111 mmol/L 104 99 (L)   CO2 21 - 32 mmol/L 33 (H) 35 (H)   Glucose 74 - 99 mg/dL 156 (H) 101 (H)     SEROLOGIES:  Serologies Latest Ref Rng & Units 02/24/2020   Hep A Ab, Total Negative   Negative   Hep B Core Ab, Total Negative   Positive (A)   Hep B Surface Ab >10.0 mIU/mL 200.10   Hep B Surface Ab Interp POS   Positive   Hep C Genotype  1b   HCV RT-PCR, Quant IU/mL 339,000   Ferritin 8 - 388 NG/ML 175   Iron % Saturation 20 - 50 % 31       LIVER HISTOLOGY:  08/2020.  FibroScan performed at [facility] [place]. EkPa was 6.9.  IQR/med 14%.  CAP 304.   The results suggested a fibrosis level of F1.  The CAP score suggests there is hepatic steatosis.      ENDOSCOPIC PROCEDURES:  Not available or performed    RADIOLOGY:  02/2020.  Abdominal ultrasound.  Mildly coarsened echotexture. No focal mass.      OTHER TESTING:  Not available or performed    FOLLOW-UP:  All of the issues listed above in the Assessment and Plan were discussed with the patient.  All questions were answered.  The patient expressed a clear understanding of the above.    [place] in 16 weeks to assess  SVR 12/cure.     Jaclynn Major, FNP-C  [facility]  [place], [place] [address]   [phone]

## 2020-11-20 LAB — HCV RT-PCR, QUANT (NON-GRAPH)
HCV IU/mL: <10 HCVIU/mL — AB
HCV log 10 IU/mL: <1 HCV log 10IU/Ml — AB
HCV log 10: <1 HCV log 10IU/Ml — AB
HCV, IU/mL: <10 HCVIU/mL — AB

## 2020-12-28 ENCOUNTER — Telehealth: Payer: Self-pay | Admitting: Radiology

## 2020-12-28 NOTE — Telephone Encounter (Signed)
[facility].

## 2020-12-31 ENCOUNTER — Other Ambulatory Visit: Payer: Self-pay | Admitting: Radiation Oncology

## 2020-12-31 DIAGNOSIS — F418 Other specified anxiety disorders: Secondary | ICD-10-CM

## 2020-12-31 MED ORDER — ALPRAZOLAM 0.25 MG PO TABS: 0.1250 mg | ORAL_TABLET | Freq: Two times a day (BID) | ORAL | 0 refills | Status: DC | PRN

## 2021-02-04 ENCOUNTER — Inpatient Hospital Stay: Admit: 2021-02-04 | Payer: PRIVATE HEALTH INSURANCE | Primary: Internal Medicine

## 2021-02-04 ENCOUNTER — Ambulatory Visit
Admit: 2021-02-04 | Discharge: 2021-02-04 | Payer: PRIVATE HEALTH INSURANCE | Attending: Family | Primary: Internal Medicine

## 2021-02-04 DIAGNOSIS — B182 Chronic viral hepatitis C: Secondary | ICD-10-CM

## 2021-02-04 LAB — BASIC METABOLIC PANEL
Anion Gap: 3 mmol/L (ref 3.0–18)
BUN: 18 MG/DL (ref 7.0–18)
Bun/Cre Ratio: 18 (ref 12–20)
CO2: 32 mmol/L (ref 21–32)
Calcium: 9 MG/DL (ref 8.5–10.1)
Chloride: 101 mmol/L (ref 100–111)
Creatinine: 1.01 MG/DL (ref 0.6–1.3)
ESTIMATED GLOMERULAR FILTRATION RATE: >60 mL/min/{1.73_m2} (ref >60)
Glucose: 169 mg/dL — ABNORMAL HIGH (ref 74–99)
Potassium: 3.7 mmol/L (ref 3.5–5.5)
Sodium: 136 mmol/L (ref 136–145)

## 2021-02-04 LAB — HEPATIC FUNCTION PANEL
A-G Ratio: 0.9 (ref 0.8–1.7)
ALT (SGPT): 28 U/L (ref 13–56)
ALT: 28 U/L (ref 13–56)
AST (SGOT): 16 U/L (ref 10–38)
AST: 16 U/L (ref 10–38)
Albumin/Globulin Ratio: 0.9 (ref 0.8–1.7)
Albumin: 3.7 g/dL (ref 3.4–5.0)
Albumin: 3.7 g/dL (ref 3.4–5.0)
Alk. phosphatase: 107 U/L (ref 45–117)
Alkaline Phosphatase: 107 U/L (ref 45–117)
Bilirubin, Direct: <0.1 MG/DL (ref 0.0–0.2)
Bilirubin, direct: <0.1 MG/DL (ref 0.0–0.2)
Bilirubin, total: 0.2 MG/DL (ref 0.2–1.0)
Globulin: 4.2 g/dL — ABNORMAL HIGH (ref 2.0–4.0)
Globulin: 4.2 g/dL — ABNORMAL HIGH (ref 2.0–4.0)
Protein, total: 7.9 g/dL (ref 6.4–8.2)
Total Bilirubin: 0.2 MG/DL (ref 0.2–1.0)
Total Protein: 7.9 g/dL (ref 6.4–8.2)

## 2021-02-04 LAB — CBC WITH AUTO DIFFERENTIAL
Basophils %: 0 % (ref 0–2)
Basophils Absolute: 0 10*3/uL (ref 0.0–0.1)
Eosinophils %: 1 % (ref 0–5)
Eosinophils Absolute: 0.1 10*3/uL (ref 0.0–0.4)
Granulocyte Absolute Count: 0 10*3/uL (ref 0.00–0.04)
Hematocrit: 39.4 % (ref 35.0–45.0)
Hemoglobin: 12 g/dL (ref 12.0–16.0)
Immature Granulocytes: 0 % (ref 0.0–0.5)
Lymphocytes %: 22 % (ref 21–52)
Lymphocytes Absolute: 1.7 10*3/uL (ref 0.9–3.6)
MCH: 27.5 PG (ref 24.0–34.0)
MCHC: 30.5 g/dL — ABNORMAL LOW (ref 31.0–37.0)
MCV: 90.4 FL (ref 78.0–100.0)
MPV: 10.1 FL (ref 9.2–11.8)
Monocytes %: 4 % (ref 3–10)
Monocytes Absolute: 0.3 10*3/uL (ref 0.05–1.2)
NRBC Absolute: 0 10*3/uL (ref 0.00–0.01)
Neutrophils %: 72 % (ref 40–73)
Neutrophils Absolute: 5.7 10*3/uL (ref 1.8–8.0)
Nucleated RBCs: 0 PER 100 WBC
Platelets: 227 10*3/uL (ref 135–420)
RBC: 4.36 M/uL (ref 4.20–5.30)
RDW: 14.3 % (ref 11.6–14.5)
WBC: 7.9 10*3/uL (ref 4.6–13.2)

## 2021-02-04 LAB — METABOLIC PANEL, BASIC
Anion gap: 3 mmol/L (ref 3.0–18)
BUN/Creatinine ratio: 18 (ref 12–20)
BUN: 18 MG/DL (ref 7.0–18)
CO2: 32 mmol/L (ref 21–32)
Calcium: 9 MG/DL (ref 8.5–10.1)
Chloride: 101 mmol/L (ref 100–111)
Creatinine: 1.01 MG/DL (ref 0.6–1.3)
Glucose: 169 mg/dL — ABNORMAL HIGH (ref 74–99)
Potassium: 3.7 mmol/L (ref 3.5–5.5)
Sodium: 136 mmol/L (ref 136–145)
eGFR: >60 mL/min/{1.73_m2} (ref >60)

## 2021-02-04 LAB — CBC WITH AUTOMATED DIFF
ABS. BASOPHILS: 0 10*3/uL (ref 0.0–0.1)
ABS. EOSINOPHILS: 0.1 10*3/uL (ref 0.0–0.4)
ABS. IMM. GRANS.: 0 10*3/uL (ref 0.00–0.04)
ABS. LYMPHOCYTES: 1.7 10*3/uL (ref 0.9–3.6)
ABS. MONOCYTES: 0.3 10*3/uL (ref 0.05–1.2)
ABS. NEUTROPHILS: 5.7 10*3/uL (ref 1.8–8.0)
ABSOLUTE NRBC: 0 10*3/uL (ref 0.00–0.01)
BASOPHILS: 0 % (ref 0–2)
EOSINOPHILS: 1 % (ref 0–5)
HCT: 39.4 % (ref 35.0–45.0)
HGB: 12 g/dL (ref 12.0–16.0)
IMMATURE GRANULOCYTES: 0 % (ref 0.0–0.5)
LYMPHOCYTES: 22 % (ref 21–52)
MCH: 27.5 PG (ref 24.0–34.0)
MCHC: 30.5 g/dL — ABNORMAL LOW (ref 31.0–37.0)
MCV: 90.4 FL (ref 78.0–100.0)
MONOCYTES: 4 % (ref 3–10)
MPV: 10.1 FL (ref 9.2–11.8)
NEUTROPHILS: 72 % (ref 40–73)
NRBC: 0 PER 100 WBC
PLATELET: 227 10*3/uL (ref 135–420)
RBC: 4.36 M/uL (ref 4.20–5.30)
RDW: 14.3 % (ref 11.6–14.5)
WBC: 7.9 10*3/uL (ref 4.6–13.2)

## 2021-02-04 NOTE — Progress Notes (Signed)
[place], MD, [place], [place], [place]      Arlyss Queen, PA-C    Genella Rife, ACNP-BC     April S Ashworth, AGPCNP-BC   Demetrios Isaacs, FNP-C    Marlyce Huge, AGPCNP-BC       [place]    at [facility]    [address] [place]  [address]    [phone]    FAX: [place]    at [facility]    [address] [place]  [address]    [phone]    FAX: [phone]       Patient Care Team:  Hewitt Shorts, MD as PCP - General (Internal Medicine Physician)  [name], MD (Radiation Oncology)      Problem List  Date Reviewed: 11/27/20            Codes Class Noted    Hep C w/o coma, chronic ([place]) ICD-10-CM: B18.2  ICD-9-CM: 070.54  09/23/2020        HTN (hypertension) ICD-10-CM: I10  ICD-9-CM: 401.9  02/24/2020        GERD (gastroesophageal reflux disease) ICD-10-CM: K21.9  ICD-9-CM: 530.81  02/24/2020        Neck pain ICD-10-CM: M54.2  ICD-9-CM: 723.1  12/31/2012        Encounter for long-term (current) use of other medications ICD-10-CM: Z79.899  ICD-9-CM: V58.69  12/31/2012        Chronic pain syndrome ICD-10-CM: G89.4  ICD-9-CM: 338.4  12/31/2012        GAD (generalized anxiety disorder) ICD-10-CM: F41.1  ICD-9-CM: 300.02  12/31/2012        DJD (degenerative joint disease), cervical ICD-10-CM: Y40.347  ICD-9-CM: 721.0  12/31/2012        History of cervical spinal surgery ICD-10-CM: Z98.890  ICD-9-CM: V45.89  12/31/2012           YULIET NEEDS returns to the [facility] today for education and management of chronic hepatitis C.  She completed an 8 course of Mavyret 12 weeks ago.  This is the only time the HCV has ever been treated.      The active problem list, all pertinent past medical history, medications, liver histology, endoscopic studies,  radiologic findings and laboratory findings related to the liver disorder were reviewed with the patient.     The patient is a 60 y.o. Black female who was screened for anti-HCV and tested positive during birth cohort screening in 11/2019.      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        There was no history of acute icteric hepatitis at the time of these risk factors.      CT scan of the liver was performed in 07/2017.    The results of the imaging demonstrated a normal appearing liver.      An assessment of liver fibrosis with fibroscan was performed in the office during the patient's 08/2020 appointment.  Results of the scan indicate mild hepatic fibrosis and a fatty liver.  The suggested Metavir fibrosis score is F1.    The patient has no complaints which can be attributed to  liver disease.    The moderate limitations in functional activities due to medical issues other than liver disease.      The patient has not experienced fatigue, fevers, chills, shortness of breath, chest pain, pain in the right side over the liver, diffuse abdominal pain, nausea, vomiting, constipation, diarrhrea, dry eyes, dry mouth, arthralgias, myalgias, yellowing of the eyes or skin, itching, dark urine, problems concentrating, swelling of the abdomen, swelling of the lower extremities, hematemesis, or hematochezia.    Since the last office appointment:  Completed 8 weeks of Mavyret 12 weeks ago.      ASSESSMENT AND PLAN:  Chronic HCV   Chronic HCV with normal to mild liver stiffness.     The most recent laboratory studies indicate that the liver transaminases are normal, alkaline phosphatase is normal, tests of hepatic synthetic and metabolic function are normal, and the platelet count is normal.      The need to perform an assessment of liver fibrosis was discussed with the patient.  The Fibroscan can assess liver fibrosis and determine if a patient has advanced  fibrosis or cirrhosis without the need for liver biopsy.  This was performed in the office during the patient's 08/2020 visit.     Results of the scan indicate mild hepatic fibrosis and a fatty liver.  The suggested Metavir fibrosis score is F1.    The Fibroscan can be repeated annually or as often as clinically indicated to assess for fibrosis progression and/or regression.    Chronic HCV Treatment  The patient was treatment naive and had genotype 1b.    She completed the 8 week treatment regime more than 12 weeks ago.  She denies missing any doses of the medication.  She has no treatment related complaints.   There is no detectable HCV RNA on today's labs.  The patient has attained SVR and is cured of the HCV.  She will always have a positive antibody to HCV, so if she is to be tested for HCV int he future, she needs have an HCV PCR drawn rather than an antibody test.      Screening for Hepatocellular Carcinoma  HCC screening was recently performed.  There is no indication of emerging HCC.  Further HCC is not indicated because the patient does not have cirrhosis.      Treatment of other medical problems in patients with chronic liver disease  There are no contraindications for the patient to take most medications that are necessary for treatment of other medical issues.  The patient can take any medications utilized for treatment of DM and/or statins to treat hypercholesterolemia  Normal doses of acetaminophen, as recommended on the label of the bottle, are not hepatotoxic except in the setting of daily alcohol use, even in patients with cirrhosis and can be utilized for pain.    Counseling for alcohol in patients with chronic liver disease  The patient was counseled regarding alcohol consumption and the effect of alcohol on chronic liver disease.  The patient has cirrhosis and was advised to be abstinent from all alcohol including non-alcoholic beer which does contain some alcohol.  The patient does not consume  any significant amount of alcohol.    Substance Use  The patient was counseled regarding the risk of overdose and death from using opioids and other narcotic drugs.  Discussed the risk of becoming reinfected with HCV once they are cured if they resume IV drug use or inhaling drugs nasally.  The patient does not use  drugs.    There is no contraindication to treating HCV in patients who are actively using drugs and the SVR/cure rate is the same as persons who no not use drugs.    Vaccinations  Vaccination for viral hepatitis A is recommended.  The patient does not have serologic evidence of prior exposure or vaccination with immunity.   Vaccination for viral hepatitis B is not required.  The patient has serologic evidence of prior exposure or vaccination with immunity.  The patient has not received  COVID-19 vaccine.    The patient was encouraged to take the COVID-19 vaccine.  Routine vaccinations against other bacterial and viral agents can be performed as indicated.  Annual flu vaccination should be administered if indicated.      ALLERGIES  Allergies   Allergen Reactions    Fentanyl Itching, Swelling and Other (comments)     Burning      Ultram [Tramadol] Nausea Only       MEDICATIONS  Current Outpatient Medications   Medication Sig    pravastatin (PRAVACHOL) 10 mg tablet     fluticasone propionate (FLONASE) 50 mcg/actuation nasal spray 1 Spray daily as needed.    ALPRAZolam (XANAX) 0.25 mg tablet     HYDROmorphone (DILAUDID) 4 mg tablet Take 4 mg by mouth every six (6) hours as needed.    methadone 10 mg/5 mL solution Take 70 mg by mouth daily.    omeprazole (PRILOSEC) 20 mg capsule     cetirizine (ZYRTEC) 10 mg tablet Take 1 Tablet by mouth daily.    levocetirizine (XYZAL) 5 mg tablet Take 5 mg by mouth daily.    lidocaine (LIDODERM) 5 %     hydrOXYzine (ATARAX) 50 mg tablet Take 50 mg by mouth three (3) times daily as needed for Itching.    lisinopril-hydrochlorothiazide (PRINZIDE, ZESTORETIC) 10-12.5 mg per  tablet Take  by mouth daily.     No current facility-administered medications for this visit.       SYSTEM REVIEW NOT RELATED TO LIVER DISEASE OR REVIEWED ABOVE:  Constitution systems: Negative for fever, chills, weight gain, weight loss.   Eyes: Negative for visual changes.  ENT: Negative for sore throat, painful swallowing.   Respiratory: Negative for cough, hemoptysis, SOB.   Cardiology: Negative for chest pain, palpitations.  GI:  Negative for constipation or diarrhea.  GU: Negative for urinary frequency, dysuria, hematuria, nocturia.   Skin: Negative for rash.  Hematology: Negative for easy bruising, blood clots.    Musculo-skelatal: Negative for back pain, muscle pain, weakness.  Neurologic: Negative for headaches, dizziness, vertigo, memory problems not related to HE.  Psychology: Negative for anxiety, depression.     FAMILY HISTORY:  The father died from "cancer" at age "49 something".   The mother died of dementia at age 77.    Spouse "died from liver disease among other things".    There is no family history of immune disorders.    SOCIAL HISTORY:  The patient is widowed.    Spouse died "liver disease among other things".    The patient has 5 children and 9 grandchildren and one great grandson.   The patient currently smokes "4 to 5 cigarettes a day".    The patient has never consumed significant amounts of alcohol.    The patient has been abstinent from alcohol since "it's been a long time".   The patient is currently receiving disability.      PHYSICAL EXAMINATION:  Visit Vitals  BP 127/62  Pulse (!) 105   Temp (!) 95.5 ??F (35.3 ??C) (Tympanic)   Wt 170 lb (77.1 kg)   SpO2 98%   BMI 31.09 kg/m??     General: No acute distress.   Eyes: Sclera anicteric.   ENT: No oral lesions.  Thyroid normal.  Nodes: No adenopathy.   Skin: No spider angiomata.  No jaundice.  No palmar erythema.  Respiratory: Lungs clear to auscultation.   Cardiovascular: Regular heart rate.  No murmurs.  No JVD.  Abdomen: Soft non-tender.   Liver size normal to percussion/palpation.  Spleen not palpable. No obvious ascites.  Extremities: No edema.  No muscle wasting.  No gross arthritic changes.  Neurologic: Alert and oriented.  Cranial nerves grossly intact.  No asterixis.    [place] [facility] [place] Units 02/04/2021 11/19/2020   WBC 4.6 - 13.2 K/uL 7.9 7.1   ANC 1.8 - 8.0 K/UL 5.7 4.5   HGB 12.0 - 16.0 g/dL 12.0 11.8 (L)   PLT 135 - 420 K/uL 227 246   INR 0.8 - 1.2       AST 10 - 38 U/L 16 12   ALT 13 - 56 U/L 28 20   Alk Phos 45 - 117 U/L 107 126 (H)   Bili, Total 0.2 - 1.0 MG/DL 0.2 0.2   Bili, Direct 0.0 - 0.2 MG/DL <0.1 0.1   Albumin 3.4 - 5.0 g/dL 3.7 3.3 (L)   BUN 7.0 - 18 MG/DL 18 24 (H)   Creat 0.6 - 1.3 MG/DL 1.01 1.27   Na 136 - 145 mmol/L 136 140   K 3.5 - 5.5 mmol/L 3.7 3.7   Cl 100 - 111 mmol/L 101 104   CO2 21 - 32 mmol/L 32 33 (H)   Glucose 74 - 99 mg/dL 169 (H) 156 (H)     SEROLOGIES:  Serologies Latest Ref Rng & Units 02/24/2020   Hep A Ab, Total Negative   Negative   Hep B Core Ab, Total Negative   Positive (A)   Hep B Surface Ab >10.0 mIU/mL 200.10   Hep B Surface Ab Interp POS   Positive   Hep C Genotype  1b   HCV RT-PCR, Quant IU/mL 339,000   Ferritin 8 - 388 NG/ML 175   Iron % Saturation 20 - 50 % 31     01/2021:  HCV RNA is not detected.      LIVER HISTOLOGY:  08/2020.  FibroScan performed at [facility] [place]. EkPa was 6.9.  IQR/med 14%.  CAP 304.   The results suggested a fibrosis level of F1.  The CAP score suggests there is hepatic steatosis.      ENDOSCOPIC PROCEDURES:  Not available or performed    RADIOLOGY:  02/2020.  Abdominal ultrasound.  Mildly coarsened echotexture. No focal mass.      OTHER TESTING:  Not available or performed    FOLLOW-UP:  All of the issues listed above in the Assessment and Plan were discussed with the patient.  All questions were answered.  The patient expressed a clear understanding of the above.    Follow-up [facility] on  a PRN basis.      Jaclynn Major, FNP-C  [facility]  [place], [place] [address]   [phone]

## 2021-02-05 LAB — HCV RT-PCR, QUANT (NON-GRAPH)
HCV IU/mL: NOT DETECTED HCVIU/mL
HCV log 10 IU/mL: NOT DETECTED HCV log 10IU/Ml
HCV log 10: NOT DETECTED HCV log 10IU/Ml
HCV, IU/mL: NOT DETECTED HCVIU/mL

## 2021-03-09 LAB — HEMOGLOBIN A1C
Estimated Avg Glucose, External: 156 mg/dL — ABNORMAL HIGH (ref 91–123)
Hemoglobin A1C, External: 7.1 % — ABNORMAL HIGH (ref 4.8–5.6)

## 2021-03-15 DIAGNOSIS — Z8619 Personal history of other infectious and parasitic diseases: Secondary | ICD-10-CM | POA: Insufficient documentation

## 2021-07-17 LAB — HEMOGLOBIN A1C
Estimated Avg Glucose, External: 162 mg/dL — ABNORMAL HIGH (ref 91–123)
Hemoglobin A1C, External: 7.3 % — ABNORMAL HIGH (ref 4.8–5.6)

## 2021-07-19 ENCOUNTER — Other Ambulatory Visit: Payer: Self-pay | Admitting: Radiation Oncology

## 2021-07-19 ENCOUNTER — Telehealth: Payer: Self-pay | Admitting: Radiology

## 2021-07-19 DIAGNOSIS — F418 Other specified anxiety disorders: Secondary | ICD-10-CM

## 2021-07-19 MED ORDER — ALPRAZOLAM 0.25 MG PO TABS: 0.1250 mg | ORAL_TABLET | Freq: Two times a day (BID) | ORAL | 0 refills | Status: DC | PRN

## 2021-07-19 NOTE — Telephone Encounter (Signed)
[facility] ?

## 2021-09-24 DIAGNOSIS — H40033 Anatomical narrow angle, bilateral: Secondary | ICD-10-CM | POA: Insufficient documentation

## 2021-09-30 DIAGNOSIS — H25012 Cortical age-related cataract, left eye: Secondary | ICD-10-CM | POA: Insufficient documentation

## 2021-09-30 DIAGNOSIS — H25011 Cortical age-related cataract, right eye: Secondary | ICD-10-CM | POA: Insufficient documentation

## 2021-10-28 LAB — HEMOGLOBIN A1C
Estimated Avg Glucose, External: 156 mg/dL — ABNORMAL HIGH (ref 91–123)
Hemoglobin A1C, External: 7.1 % — ABNORMAL HIGH (ref 4.8–5.6)

## 2021-12-13 ENCOUNTER — Telehealth: Payer: Self-pay | Admitting: Oncology

## 2021-12-13 NOTE — Telephone Encounter (Signed)
Valerie Kerr called and requested a refill of [facility] [place] [place].  She is currently living in [place] but it is not established with a doctor yet. She does not have a follow up scheduled with Dr. Sondra Come.

## 2021-12-14 ENCOUNTER — Other Ambulatory Visit: Payer: Self-pay | Admitting: Radiation Oncology

## 2021-12-14 DIAGNOSIS — F418 Other specified anxiety disorders: Secondary | ICD-10-CM

## 2021-12-14 MED ORDER — ALPRAZOLAM 0.25 MG PO TABS: 0.1250 mg | ORAL_TABLET | Freq: Two times a day (BID) | ORAL | 0 refills | Status: DC | PRN

## 2021-12-14 NOTE — Telephone Encounter (Signed)
Called Valerie Kerr back and advised that Dr. Sondra Come will need to see her for follow up before he refills the Xanax.  Catalia said she needs the Xanax because she is going to have an injection in her back soon.  Advised that we can schedule the follow up soon.  She is going to check on transportation and call back to schedule the follow up.

## 2022-03-30 ENCOUNTER — Telehealth: Payer: Self-pay | Admitting: *Deleted

## 2022-03-30 ENCOUNTER — Telehealth: Payer: Self-pay

## 2022-03-30 ENCOUNTER — Other Ambulatory Visit: Payer: Self-pay | Admitting: Radiation Oncology

## 2022-03-30 DIAGNOSIS — F418 Other specified anxiety disorders: Secondary | ICD-10-CM

## 2022-03-30 MED ORDER — ALPRAZOLAM 0.25 MG PO TABS: 0.1250 mg | ORAL_TABLET | Freq: Two times a day (BID) | ORAL | 0 refills | Status: DC | PRN

## 2022-03-30 NOTE — Telephone Encounter (Signed)
Received call from patient requesting a F/U appointment with Dr. Sondra Come sometime in March of this year. She also requested that a refill of her [facility]prazol[facility] [place] [facility]. Informed her that I would transfer her to one of our schedulers to help find an appointment date/time that worked for the patient, and that I would pass along her prescription request to Dr. Sondra Come (and call her back with his response). She verbalized agreement and denied any other needs at this time

## 2022-03-30 NOTE — Telephone Encounter (Signed)
RETURNED PATIENT'S PHONE CALL, SPOKE WITH PATIENT. ?

## 2022-05-12 ENCOUNTER — Ambulatory Visit: Payer: Medicare (Managed Care) | Admitting: Radiation Oncology

## 2022-05-19 ENCOUNTER — Other Ambulatory Visit (HOSPITAL_COMMUNITY)
Admission: RE | Admit: 2022-05-19 | Discharge: 2022-05-19 | Disposition: A | Discharge status: Home / Self Care | Payer: Medicare (Managed Care) | Source: Ambulatory Visit | Attending: Radiation Oncology | Admitting: Radiation Oncology

## 2022-05-19 ENCOUNTER — Ambulatory Visit
Admission: RE | Admit: 2022-05-19 | Discharge: 2022-05-19 | Disposition: A | Discharge status: Home / Self Care | Payer: Medicaid Other | Source: Ambulatory Visit | Attending: Radiation Oncology | Admitting: Radiation Oncology

## 2022-05-19 ENCOUNTER — Other Ambulatory Visit (HOSPITAL_COMMUNITY): Payer: Self-pay

## 2022-05-19 VITALS — BP 152/81 | HR 106 | Temp 96.6°F | Resp 18 | Ht 61.5 in | Wt 173.1 lb

## 2022-05-19 DIAGNOSIS — Z8541 Personal history of malignant neoplasm of cervix uteri: Secondary | ICD-10-CM | POA: Diagnosis present

## 2022-05-19 DIAGNOSIS — F418 Other specified anxiety disorders: Secondary | ICD-10-CM

## 2022-05-19 DIAGNOSIS — Z923 Personal history of irradiation: Secondary | ICD-10-CM | POA: Insufficient documentation

## 2022-05-19 DIAGNOSIS — C530 Malignant neoplasm of endocervix: Secondary | ICD-10-CM

## 2022-05-19 MED ORDER — ALPRAZOLAM 0.25 MG PO TABS: 0.1250 mg | ORAL_TABLET | Freq: Two times a day (BID) | ORAL | 0 refills | Status: DC | PRN

## 2022-05-19 MED ORDER — OXYCODONE-ACETAMINOPHEN 5-325 MG PO TABS
1.0000 | ORAL_TABLET | Freq: Four times a day (QID) | ORAL | 0 refills | Status: DC | PRN
  Filled 2022-05-19: qty 25, 7d supply, fill #0

## 2022-05-19 NOTE — Progress Notes (Addendum)
Radiation Oncology         [phone] ________________________________  Name: Valerie Kerr MRN: [id number]  Date: 05/19/2022  DOB: 07-Nov-1960  Follow-Up Visit Note  CC: Charlott Rakes, MD  Charlott Rakes, MD    ICD-10-CM   1. Malignant neoplasm of endocervix ([place])  C53.0 Cytology - PAP    2. 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    Interval Since Last Radiation: 5 years, 11 months, and 16 days   04/12/2016 - 05/31/2016: 1) Pelvis/ 45 Gy in 25 fractions, 3D/15X 2) pelvic Boost/ 9 Gy in 5 fractions, Isodose Plan/15X   06/20/2016 - 07/04/2016: Cervix/ 28 Gy in 5 fractions, HDR Ir-192 (Tandem/Ring)  Narrative:  The patient returns today for routine follow-up. She was last seen for follow-up on 11/09/20. No significant oncologic interval history since the patient was last seen for follow-up.   She continues to have significant low back pain which she relates to sciatic issues.  She reports seeing an orthopedic surgeon in the [facility] area concerning this issue. She reports a lot of anxiety in her life with her niece dying of automobile accident,  brother dying of Covid.   She denies any vaginal bleeding hematuria or rectal bleeding.  She denies any significant abdominal pain or diarrhea.                             Allergies:  is allergic to codeine, fentanyl, hydrocodone-acetaminophen, and tramadol.  Meds: Current Outpatient Medications  Medication Sig Dispense Refill   oxyCODONE-acetaminophen (PERCOCET/ROXICET) 5-325 MG tablet Take 1 tablet by mouth every 6 hours as needed for severe pain. 25 tablet 0   ALPRAZolam (XANAX) 0.25 MG tablet Take 0.5 tablets (0.125 mg total) by mouth 2 (two) times daily as needed for anxiety. 30 tablet 0   cetirizine (ZYRTEC) 10 MG tablet  TAKE 1 TABLET BY MOUTH EVERY DAY 90 tablet 0   cyclobenzaprine (FLEXERIL) 10 MG tablet Take by mouth. (Patient not taking: Reported on 11/09/2020)     fluticasone (FLONASE) 50 MCG/ACT nasal spray INSTILL 1 SPRAY INTO EACH NOSTRIL DAILY 48 mL 0   levocetirizine (XYZAL) 5 MG tablet Take 5 mg by mouth at bedtime.     lidocaine (LIDODERM) 5 % Place 1 patch onto the skin daily. Remove & Discard patch within 12 hours or as directed by MD 30 patch 0   lisinopril-hydrochlorothiazide (ZESTORETIC) 20-25 MG tablet Take 1 tablet by mouth daily. 30 tablet 0   methadone (DOLOPHINE) 10 MG/5ML solution Take 70 mg by mouth daily in the afternoon.     omeprazole (PRILOSEC) 20 MG capsule Take 1 capsule (20 mg total) by mouth daily. Must have office visit for refills 90 capsule 0   prochlorperazine (COMPAZINE) 10 MG tablet Take 1 tablet (10 mg total) by mouth every 6 (six) hours as needed for nausea. 30 tablet 0   Vitamin D, Ergocalciferol, (DRISDOL) 50000 units CAPS capsule Take 1 capsule (50,000 Units total) by mouth every 7 (seven) days. (Patient taking differently: Take 50,000 Units by mouth every Saturday.) 16 capsule 0   VOLTAREN 1 % GEL Apply 4 g topically 4 (four) times daily. 100 g 0   No current facility-administered medications for this encounter.    Physical Findings: The patient is in  no acute distress. Patient is alert and oriented.  height is 5' 1.5" (1.562 m) and weight is 173 lb 2 oz (78.5 kg). Her temporal temperature is 96.6 F (35.9 C) (abnormal). Her blood pressure is 152/81 (abnormal) and her pulse is 106 (abnormal). Her respiration is 18 and oxygen saturation is 97%. .  Lungs are clear to auscultation bilaterally. Heart has regular rate and rhythm. No palpable cervical, supraclavicular, or axillary adenopathy. Abdomen soft, non-tender, normal bowel sounds.  On pelvic examination the external genitalia were unremarkable. A speculum exam was performed. There are no mucosal lesions noted in the  vaginal vault.  A Pap smear was obtained of the cervical os.   On bimanual and rectovaginal examination there were no pelvic masses appreciated.  The cervix palpates a smooth without nodularity.  No significant enlargement.  Lab Findings: Lab Results  Component Value Date   WBC 7.9 02/10/2020   HGB 12.3 02/10/2020   HCT 39.2 02/10/2020   MCV 89.7 02/10/2020   PLT 265 02/10/2020    Radiographic Findings: No results found.  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    No evidence of recurrence on clinical exam today, Pap smear pending.  She is having a lot of anxiety and I refilled low-dose Xanax.  In light of her back pain,  I have given her a limited dose of Percocet.  Plan: Routine follow-up in 1 year.     25 minutes of total time was spent for this patient encounter, including preparation, face-to-face counseling with the patient and coordination of care, physical exam, and documentation of the encounter. ____________________________________   [name], PhD, MD  This document serves as a record of services personally performed by [name], MD. It was created on his behalf by Roney Mans, a trained medical scribe. The creation of this record is based on the scribe's personal observations and the provider's statements to them. This document has been checked and approved by the attending provider.

## 2022-05-19 NOTE — Progress Notes (Signed)
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  They completed their radiation on: 07/04/16   Does the patient complain of any of the following:  Pain: Reports on-going pain to lower back and down both legs. Reports she's managing with arthritis strength Tylenol and pain patches, but the pain generally persists Abdominal bloating: Denies Diarrhea/Constipation: Reports occasional constipation  Nausea/Vomiting: Reports occasional nausea but states it's manageable  Wt Readings from Last 3 Encounters:  05/19/22 173 lb 2 oz (78.5 kg)  11/09/20 165 lb 6.4 oz (75 kg)  05/18/20 176 lb (79.8 kg)   Vaginal Discharge: Denies Blood in Urine or Stool: Denies Urinary Issues (dysuria/incomplete emptying/ incontinence/ increased frequency/urgency): Denies Does patient report using vaginal dilator 2-3 times a week and/or sexually active 2-3 weeks: Denies Post radiation skin changes: Denies  Additional comments if applicable: Reports trouble sleeping related to personal/family stress and grief. She's trying to get established with a grief counselor but has been unsuccessful thus far.

## 2022-05-25 LAB — CYTOLOGY - PAP
Diagnosis: NEGATIVE
Diagnosis: REACTIVE

## 2022-05-26 ENCOUNTER — Telehealth: Payer: Self-pay

## 2022-05-26 NOTE — Telephone Encounter (Signed)
Call placed to make patient aware of negative PAP smear results, per Dr. Sondra Come. Patient voiced understanding.

## 2022-06-08 ENCOUNTER — Telehealth: Payer: Self-pay

## 2022-06-08 NOTE — Addendum Note (Signed)
Encounter addended by: [name], MD on: 06/08/2022 4:09 PM  Actions taken: Clinical Note Signed

## 2022-06-08 NOTE — Telephone Encounter (Signed)
Patient called in requesting refill  on oxycodone 5-325 mg. Patient requesting medication be sent to [place].

## 2022-06-09 ENCOUNTER — Other Ambulatory Visit: Payer: Self-pay | Admitting: Radiation Oncology

## 2022-06-09 MED ORDER — OXYCODONE-ACETAMINOPHEN 5-325 MG PO TABS: 1.0000 | ORAL_TABLET | Freq: Four times a day (QID) | ORAL | 0 refills | Status: DC | PRN

## 2022-06-09 NOTE — Telephone Encounter (Signed)
Call placed to patient to make aware that Dr. Sondra Come will fill oxycodone 5-'325mg'$  once more then patient to follow up with orthopedist for remaining refills. Patient voiced understanding.

## 2022-07-06 ENCOUNTER — Other Ambulatory Visit: Payer: Self-pay | Admitting: Radiation Oncology

## 2022-07-06 MED ORDER — OXYCODONE-ACETAMINOPHEN 5-325 MG PO TABS: 1.0000 | ORAL_TABLET | Freq: Four times a day (QID) | ORAL | 0 refills | Status: DC | PRN

## 2022-07-06 NOTE — Telephone Encounter (Signed)
Patient called in requesting refill on oxycodone 5-325mg . Reminded patient of  our last conversation on 06/09/22 that she would need to follow up with orthopedist for further refills. Per patient she will have scan on 4/16 then see orthopedist. Patient requesting [facility]

## 2022-10-25 ENCOUNTER — Telehealth: Payer: Self-pay

## 2022-10-25 ENCOUNTER — Other Ambulatory Visit: Payer: Self-pay | Admitting: Radiation Oncology

## 2022-10-25 DIAGNOSIS — F418 Other specified anxiety disorders: Secondary | ICD-10-CM

## 2022-10-25 MED ORDER — ALPRAZOLAM 0.25 MG PO TABS
0.1250 mg | ORAL_TABLET | Freq: Two times a day (BID) | ORAL | 0 refills | Status: DC | PRN
Start: 2022-10-25 — End: 2022-11-29

## 2022-10-25 NOTE — Telephone Encounter (Signed)
Patient called in requesting refill on Xanax 0.25mg . Encouraged patient to follow up with PCP for  future refill. Patient requesting ref[facility] [place].

## 2022-11-29 ENCOUNTER — Other Ambulatory Visit: Payer: Self-pay | Admitting: Radiation Oncology

## 2022-11-29 ENCOUNTER — Telehealth: Payer: Self-pay

## 2022-11-29 DIAGNOSIS — F418 Other specified anxiety disorders: Secondary | ICD-10-CM

## 2022-11-29 MED ORDER — ALPRAZOLAM 0.25 MG PO TABS
0.1250 mg | ORAL_TABLET | Freq: Two times a day (BID) | ORAL | 0 refills | Status: DC | PRN
Start: 2022-11-29 — End: 2023-01-03

## 2022-11-29 NOTE — Telephone Encounter (Signed)
Patient called in requesting refill on Xanax 0.25mg . Patient requesting medication be sent to [facility].

## 2022-12-30 ENCOUNTER — Telehealth: Payer: Self-pay

## 2022-12-30 NOTE — Telephone Encounter (Signed)
Patient called in to request refill on Alprazolam Requesting refill be sent to [facility].

## 2023-01-03 ENCOUNTER — Other Ambulatory Visit: Payer: Self-pay | Admitting: Radiation Oncology

## 2023-01-03 DIAGNOSIS — F418 Other specified anxiety disorders: Secondary | ICD-10-CM

## 2023-01-03 MED ORDER — ALPRAZOLAM 0.25 MG PO TABS
0.1250 mg | ORAL_TABLET | Freq: Two times a day (BID) | ORAL | 0 refills | Status: DC | PRN
Start: 2023-01-03 — End: 2023-02-06

## 2023-01-26 ENCOUNTER — Inpatient Hospital Stay
Admit: 2023-01-26 | Discharge: 2023-01-26 | Disposition: A | Discharge status: Home / Self Care | Payer: PRIVATE HEALTH INSURANCE

## 2023-01-26 DIAGNOSIS — M5432 Sciatica, left side: Secondary | ICD-10-CM

## 2023-01-26 MED ORDER — OXYCODONE-ACETAMINOPHEN 5-325 MG PO TABS
5-325 | ORAL | Status: AC
Start: 2023-01-26 — End: 2023-01-26
  Administered 2023-01-26: 15:00:00 1 via ORAL

## 2023-01-26 MED ORDER — METHYLPREDNISOLONE 4 MG PO TBPK
4 | PACK | ORAL | 0 refills | Status: AC
Start: 2023-01-26 — End: 2023-02-01

## 2023-01-26 MED ORDER — PREDNISONE 20 MG PO TABS
20 | ORAL | Status: AC
Start: 2023-01-26 — End: 2023-01-26
  Administered 2023-01-26: 15:00:00 60 mg via ORAL

## 2023-01-26 MED FILL — PREDNISONE 20 MG PO TABS: 20 MG | ORAL | Qty: 3

## 2023-01-26 MED FILL — OXYCODONE-ACETAMINOPHEN 5-325 MG PO TABS: 5-325 MG | ORAL | Qty: 1

## 2023-01-26 NOTE — Discharge Instructions (Signed)
Call your neurosurgeon today for early follow-up  Apply ice to your left buttocks over area of pain, 20 minutes hourly  Can try Lidoderm patch  Steroids were prescribed, start your steroid pack tomorrow.  Watch your blood sugars carefully as it can cause increase in your blood sugar  I recommend that you try the gabapentin and muscle relaxer prescribed to you by your doctor  Return to ER if you develop any new or worsening symptoms, fever, leg weakness, saddle anesthesia or incontinence.

## 2023-01-26 NOTE — ED Provider Notes (Signed)
EMERGENCY DEPARTMENT HISTORY & PHYSICAL EXAM    01/26/23, 10:49 AM EDT    Clinical Impression:  1. Sciatica of left side        Assessment/Differential Diagnosis:     Ddx chronic pain, sciatica, trauma, muscle strain/spasm, spinal cord injury all considered    ED Course:   Initial assessment performed. The patients presenting problems have been discussed, and they are in agreement with the care plan formulated and outlined with them.  I have encouraged them to ask questions as they arise throughout their visit.    Patient comes the ED with left leg pain.  Patient gives history of chronic pain to her legs.  Patient is able to give me history of cervical surgery, multiple injections in her back for this similar pain.  Patient states she was doing relatively well until about a month ago when her pain started to return.  She was seen at her neurosurgery office a few days ago.  There is plan for MRI, and she was prescribed gabapentin and a small quantity of Norco.  Patient states that she does not like to take gabapentin and has not retried that.  She does have a muscle relaxer at home which she has not tried.  She does not feel the Norco is helpful.  She states she comes to the ED for pain control.  She denies any fever, new trauma, new leg weakness, incontinence or saddle anesthesia.  She describes her pain today is similar pain that she has had in the past.  She describes a burning pain from her left buttocks down to her calf, worse with certain movements.  Patient is diabetic, she tells me her blood sugars are always below 150.    Exam with middle-age female laying on stretcher.  She appears comfortable at rest.  She appears uncomfortable with change in position and rolling to her side.  She is able to do this without assistance.  Vitals with elevated blood pressure.  Heart regular rate and rhythm, lungs clear to auscultation, abdomen soft and nontender.  Patient is able to  roll to her right side without assistance.  She has no midline tenderness.  No swelling or bony defect.  Patient does have point tenderness in her mid left gluteal muscle with a burning pain down her leg with palpation to this area.  Overlying skin appears normal.  Lower extremity with good strength and sensation, bilaterally equal.  Good reflexes, bilaterally equal.  Positive straight leg raise on the left.  Foot is neurovascular intact    I did review her visit from October 28 with her neurosurgery office.  No concerning findings on that exam.  It does appear she will be scheduled for MRI.  Gabapentin and Norco were prescribed.    I did discuss with the patient that I cannot prescribe narcotic medication for chronic pain.  This will need to come from her outside doctors.  Patient states she has done steroids in the past with some relief with little change in her blood sugars.  For that reason we will try a Medrol Dosepak.  1 Percocet was given here in the ED with new prescription home.  Discussed other symptomatic care options, I encouraged her to take her muscle relaxer and gabapentin.  Return precautions were given    Medical Chart Review:  I have reviewed triage nursing documentation.      Disposition:  Home  in stable condition.      Chief Complaint   Patient  presents with    Leg Pain     HPI:    The history is provided by patient. No language interpreter used.    MARIANE BURPEE is a 62 y.o. female presenting to the Emergency Department with complaints of back/leg pain. Patient comes the ED with left leg pain.  Patient gives history of chronic pain to her legs.  Patient is able to give me history of cervical surgery, multiple injections in her back for this similar pain.  Patient states she was doing relatively well until about a month ago when her pain started to return.  She was seen at her neurosurgery office a few days ago.  There is plan for MRI, and she was prescribed gabapentin and a small quantity  of Norco.  Patient states that she does not like to take gabapentin and has not retried that.  She does have a muscle relaxer at home which she has not tried.  She does not feel the Norco is helpful.  She states she comes to the ED for pain control.  She denies any fever, new trauma, new leg weakness, incontinence or saddle anesthesia.  She describes her pain today is similar pain that she has had in the past.  She describes a burning pain from her left buttocks down to her calf, worse with certain movements.  Patient is diabetic, she tells me her blood sugars are always below 150.      I have reviewed all PMHX, FMHX and Social Hx as entered into the medical record in the chart below using the Epic Template.    Review of Systems:  Constitutional: neg for fever, chills  ENT:  neg for URI symptoms  Respiratory:  neg for cough, shortness of breath  Cardiovascular:  neg for chest pain  GI:  neg for abdominal pain.  GU:  No dysuria, hematuria. No Flank pain.  MSK: positive for back  pain.+ left leg, no other extremity pain. No injury.   Integumentary: no rashes, or skin trauma  Neurological: neg for headaches  All other systems reviewed negative with exception of positives in ROS and HPI.    Past Medical History:  Past Medical History:   Diagnosis Date    Arthritis     Essential hypertension        Past Surgical History:  No past surgical history on file.    Family History:  No family history on file.    Social History:  Social History     Tobacco Use    Smoking status: Every Day     Current packs/day: 0.50     Types: Cigarettes    Smokeless tobacco: Never   Substance Use Topics    Alcohol use: Not Currently    Drug use: Not Currently       Allergies:  Allergies   Allergen Reactions    Fentanyl Itching, Other (See Comments) and Swelling     Burning    Tramadol Nausea Only       Vital Signs:  Vitals:    01/26/23 1042 01/26/23 1044   BP: (!) 157/78    Pulse: 100    Resp:  22   Temp: 98.3 F (36.8 C)    TempSrc: Oral     SpO2: 96%    Weight: 78 kg (172 lb)    Height: 1.549 m (5\' 1" )      Physical Exam:  Vital Signs Reviewed. Nursing Notes Reviewed.  Constitutional:  Well developed, well nourished patient.  Appearance and behavior are age and situation appropriate. Ambulating with a cane  Head: Normocephalic, Atraumatic  Eyes: Conjunctiva clear, lids normal. Sclera anicteric.   VWU:JWJXBJY grossly intact  Neck:  supple, no meningeal signs, No swelling.  Lungs: No respiratory distress, CTAB. No cough.  CV:  regular rate and rhythm, no murmur   ABD: soft, nontender, normal BS, no mass   Extremities:  good strength and sensation, bilat equal,  with testing of LE.  Feet neurovasc intact   Neuro:  A&O x 3. CN II-XII grossly intact.No gross neuro deficits. DTRs wnl, bilat equal at patella and achilles . SLR + left.  Skin:  Warm, dry, no rash.  Spine:  No midline tenderness to palpation of the cervical, thoracic or lumbar spine.  No obvious bony defect. No swelling. Skin with no signs of trauma. Paraspinous Muscles  left gluteal m palpates normally, but tender with palpation with pain radiating to calf. Skin normal.    Diagnostics:    Labs -   No results found for this or any previous visit (from the past 12 hour(s)).    EKG: When ordered, EKG's are interpreted by the Emergency Department Provider in the absence of a cardiologist.  Please see their note for interpretation of EKG.      RADIOLOGY:  Non-plain film images such as CT, Ultrasound and MRI are read by the radiologist. Plain radiographic images are visualized and preliminarily interpreted by the ED.  Interpretation per the Radiologist below, if available at the time of this note:  No orders to display       Medications given in the ED-  Medications   predniSONE (DELTASONE) tablet 60 mg (60 mg Oral Given 01/26/23 1113)   oxyCODONE-acetaminophen (PERCOCET) 5-325 MG per tablet 1 tablet (1 tablet Oral Given 01/26/23 1113)       Please note that this dictation was completed with Dragon,  the computer voice recognition software.  Quite often unanticipated grammatical, syntax, homophones, and other interpretive errors are inadvertently transcribed by the computer software.  Please disregard these errors.  Please excuse any errors that have escaped final proofreading.       Wayne Both, PA-C  01/26/23 1128

## 2023-01-26 NOTE — ED Triage Notes (Signed)
Pt ambulated to triage. C/C chronic leg pain. Pt supposed to get MRI in Feb.

## 2023-02-06 ENCOUNTER — Other Ambulatory Visit: Payer: Self-pay | Admitting: Radiation Oncology

## 2023-02-06 ENCOUNTER — Telehealth: Payer: Self-pay

## 2023-02-06 DIAGNOSIS — F418 Other specified anxiety disorders: Secondary | ICD-10-CM

## 2023-02-06 MED ORDER — ALPRAZOLAM 0.25 MG PO TABS
0.1250 mg | ORAL_TABLET | Freq: Two times a day (BID) | ORAL | 0 refills | Status: DC | PRN
Start: 2023-02-06 — End: 2023-04-13

## 2023-02-06 NOTE — Telephone Encounter (Signed)
Ms. Donyea Wilensky called to request a refill on her Alprazolam (Xanax) 0.25 MG tablet. [facility].

## 2023-02-11 ENCOUNTER — Inpatient Hospital Stay
Admit: 2023-02-11 | Discharge: 2023-02-12 | Disposition: A | Discharge status: Home / Self Care | Payer: PRIVATE HEALTH INSURANCE | Admitting: Student in an Organized Health Care Education/Training Program

## 2023-02-11 DIAGNOSIS — G8929 Other chronic pain: Secondary | ICD-10-CM

## 2023-02-11 LAB — BASIC METABOLIC PANEL
Anion Gap: 3 mmol/L (ref 3.0–18)
BUN/Creatinine Ratio: 23 — ABNORMAL HIGH (ref 12–20)
BUN: 20 mg/dL — ABNORMAL HIGH (ref 7.0–18)
CO2: 33 mmol/L — ABNORMAL HIGH (ref 21–32)
Calcium: 9.2 mg/dL (ref 8.5–10.1)
Chloride: 104 mmol/L (ref 100–111)
Creatinine: 0.87 mg/dL (ref 0.6–1.3)
Est, Glom Filt Rate: 74 mL/min/{1.73_m2} (ref >60)
Glucose: 121 mg/dL — ABNORMAL HIGH (ref 74–99)
Potassium: 3.9 mmol/L (ref 3.5–5.5)
Sodium: 140 mmol/L (ref 136–145)

## 2023-02-11 LAB — CBC WITH AUTO DIFFERENTIAL
Basophils %: 0 % (ref 0–2)
Basophils Absolute: 0 10*3/uL (ref 0.0–0.1)
Eosinophils %: 2 % (ref 0–5)
Eosinophils Absolute: 0.2 10*3/uL (ref 0.0–0.4)
Hematocrit: 36.1 % (ref 35.0–45.0)
Hemoglobin: 11.5 g/dL — ABNORMAL LOW (ref 12.0–16.0)
Immature Granulocytes %: 0 % (ref 0.0–0.5)
Immature Granulocytes Absolute: 0 10*3/uL (ref 0.00–0.04)
Lymphocytes %: 14 % — ABNORMAL LOW (ref 21–52)
Lymphocytes Absolute: 1.2 10*3/uL (ref 0.9–3.6)
MCH: 28.2 pg (ref 24.0–34.0)
MCHC: 31.9 g/dL (ref 31.0–37.0)
MCV: 88.5 FL (ref 78.0–100.0)
MPV: 9.7 FL (ref 9.2–11.8)
Monocytes %: 4 % (ref 3–10)
Monocytes Absolute: 0.4 10*3/uL (ref 0.05–1.2)
Neutrophils %: 79 % — ABNORMAL HIGH (ref 40–73)
Neutrophils Absolute: 7.1 10*3/uL (ref 1.8–8.0)
Nucleated RBCs: 0 /100{WBCs}
Platelets: 244 10*3/uL (ref 135–420)
RBC: 4.08 M/uL — ABNORMAL LOW (ref 4.20–5.30)
RDW: 13.3 % (ref 11.6–14.5)
WBC: 8.9 10*3/uL (ref 4.6–13.2)
nRBC: 0 10*3/uL (ref 0.00–0.01)

## 2023-02-11 MED ORDER — MORPHINE SULFATE (PF) 4 MG/ML IJ SOLN
4 | INTRAMUSCULAR | Status: AC
Start: 2023-02-11 — End: 2023-02-11
  Administered 2023-02-11: 21:00:00 4 mg via INTRAVENOUS

## 2023-02-11 MED ORDER — DEXAMETHASONE SOD PHOSPHATE PF 10 MG/ML IJ SOLN
10 | INTRAMUSCULAR | Status: AC
Start: 2023-02-11 — End: 2023-02-11
  Administered 2023-02-11: 21:00:00 6 mg via INTRAVENOUS

## 2023-02-11 MED ORDER — METHYLPREDNISOLONE 4 MG PO TBPK
4 | PACK | ORAL | 0 refills | Status: AC
Start: 2023-02-11 — End: ?

## 2023-02-11 MED ORDER — HYDROMORPHONE HCL PF 1 MG/ML IJ SOLN
1 | INTRAMUSCULAR | Status: AC
Start: 2023-02-11 — End: 2023-02-11
  Administered 2023-02-11: 23:00:00 0.5 mg via INTRAVENOUS

## 2023-02-11 MED ORDER — KETOROLAC TROMETHAMINE 15 MG/ML IJ SOLN
15 | INTRAMUSCULAR | Status: AC
Start: 2023-02-11 — End: 2023-02-11
  Administered 2023-02-11: 21:00:00 30 mg via INTRAVENOUS

## 2023-02-11 MED ORDER — ACETAMINOPHEN 500 MG PO TABS
500 | ORAL | Status: AC
Start: 2023-02-11 — End: 2023-02-11
  Administered 2023-02-11: 21:00:00 1000 mg via ORAL

## 2023-02-11 MED ORDER — DIAZEPAM 5 MG/ML IJ SOLN
5 | Freq: Once | INTRAMUSCULAR | Status: AC
Start: 2023-02-11 — End: 2023-02-11
  Administered 2023-02-12: 5 mg via INTRAVENOUS

## 2023-02-11 MED ORDER — OXYCODONE-ACETAMINOPHEN 5-325 MG PO TABS
5-325 | ORAL_TABLET | Freq: Four times a day (QID) | ORAL | 0 refills | Status: AC | PRN
Start: 2023-02-11 — End: 2023-02-16

## 2023-02-11 MED ORDER — ONDANSETRON HCL 4 MG/2ML IJ SOLN
4 | INTRAMUSCULAR | Status: AC
Start: 2023-02-11 — End: 2023-02-11
  Administered 2023-02-11: 21:00:00 4 mg via INTRAVENOUS

## 2023-02-11 MED ORDER — SODIUM CHLORIDE 0.9 % IV BOLUS
0.9 | Freq: Once | INTRAVENOUS | Status: AC
Start: 2023-02-11 — End: 2023-02-11
  Administered 2023-02-11: 22:00:00 500 mL via INTRAVENOUS

## 2023-02-11 MED FILL — KETOROLAC TROMETHAMINE 15 MG/ML IJ SOLN: 15 MG/ML | INTRAMUSCULAR | Qty: 2 | Fill #0

## 2023-02-11 MED FILL — HYDROMORPHONE HCL 1 MG/ML IJ SOLN: 1 MG/ML | INTRAMUSCULAR | Qty: 1 | Fill #0

## 2023-02-11 MED FILL — SODIUM CHLORIDE 0.9 % IV SOLN: 0.9 % | INTRAVENOUS | Qty: 500 | Fill #0

## 2023-02-11 MED FILL — DEXAMETHASONE SOD PHOSPHATE PF 10 MG/ML IJ SOLN: 10 MG/ML | INTRAMUSCULAR | Qty: 1 | Fill #0

## 2023-02-11 MED FILL — MORPHINE SULFATE 4 MG/ML IJ SOLN: 4 mg/mL | INTRAMUSCULAR | Qty: 1 | Fill #0

## 2023-02-11 MED FILL — ACETAMINOPHEN 500 MG PO TABS: 500 MG | ORAL | Qty: 2 | Fill #0

## 2023-02-11 MED FILL — ONDANSETRON HCL 4 MG/2ML IJ SOLN: 4 MG/2ML | INTRAMUSCULAR | Qty: 2 | Fill #0

## 2023-02-11 NOTE — ED Triage Notes (Signed)
 PT BIBA due to sciatica exacerbation. Pt states long hx of sciatica, but today was much worse. Pt unable to ambulate and do ADLs.    Takes gabapentin.    Pt A&Ox4

## 2023-02-11 NOTE — ED Notes (Signed)
 The following labs were labeled with appropriate pt sticker and tubed to lab:     [x]  Blue     [x]  Lavender   []  on ice  [x]  Green/yellow  []  Green/black []  on ice  []  Grey  []  on ice  [x]  Yellow  []  Red  []  Pink  []  Type/ Screen  []  ABG  []  VBG    []  COVI

## 2023-02-11 NOTE — ED Provider Notes (Signed)
 [facility] EMERGENCY DEPT  EMERGENCY DEPARTMENT ENCOUNTER       Pt Name: Erica Zamora  MRN: [id number]  Birthdate [birth year]  Date of evaluation: 02/11/2023  PCP: Otis Brace, APRN - NP  Note Started: 12:43 AM 02/11/23     CHIEF COMPLAINT       Chief Compl

## 2023-02-12 MED FILL — DIAZEPAM 5 MG/ML IJ SOLN: 5 MG/ML | INTRAMUSCULAR | Qty: 2 | Fill #0

## 2023-02-23 LAB — HEMOGLOBIN A1C: Hemoglobin A1C, External: 6.5 % — ABNORMAL HIGH (ref 0.0–5.7)

## 2023-03-07 ENCOUNTER — Telehealth: Payer: Self-pay

## 2023-03-07 NOTE — Telephone Encounter (Signed)
Daughter Valerie Kerr called in to report patient recently being discharged from hospital in [place]. 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 Patient is currently non ambulatory due to bones degenerating. Per daughter patient would like to be seen by Dr. Roselind Messier prior to February appointment. Requested daughter have hospital in [place] send over all scans and reports to be reviewed by Dr. Roselind Messier. Daughter voiced understanding.

## 2023-03-23 ENCOUNTER — Telehealth: Payer: Self-pay | Admitting: *Deleted

## 2023-03-23 NOTE — Telephone Encounter (Signed)
RETURNED PATIENT'S PHONE CALL, SPOKE WITH PATIENT. ?

## 2023-03-24 ENCOUNTER — Telehealth: Payer: Self-pay | Admitting: *Deleted

## 2023-03-24 NOTE — Telephone Encounter (Signed)
RETURNED PATIENT'S PHONE CALL, SPOKE WITH PATIENT. ?

## 2023-04-06 ENCOUNTER — Ambulatory Visit: Payer: Self-pay | Admitting: Radiation Oncology

## 2023-04-06 ENCOUNTER — Telehealth: Payer: Self-pay

## 2023-04-06 ENCOUNTER — Other Ambulatory Visit: Payer: Self-pay | Admitting: Radiation Oncology

## 2023-04-06 MED ORDER — OXYCODONE-ACETAMINOPHEN 5-325 MG PO TABS: 1.0000 | ORAL_TABLET | Freq: Four times a day (QID) | ORAL | 0 refills | Status: DC | PRN

## 2023-04-06 NOTE — Telephone Encounter (Signed)
 Patient called in requesting refill on Oxycodone 5mg ,  [facility].

## 2023-04-12 NOTE — Progress Notes (Signed)
Histology and Location of Primary Cancer: Cervix  Sites of Visceral and Bony Metastatic Disease:   Diffusely increased uptake throughout the sacrum, consistent with a mixed sclerotic and lytic lesion when correlated with recent CT abdomen pelvis.  This is also noted to have been recently biopsied on 02/27/2023.  No additional suspicious focal osseous lesion identified.  Past/Anticipated chemotherapy by medical oncology, if any: None at this time.   Pain on a scale of 0-10 is: 8    If Spine Met(s), symptoms, if any, include: Bowel/Bladder retention or incontinence (please describe): Denies Numbness or weakness in extremities (please describe): She reports numbness in left foot. Current Decadron regimen, if applicable: Yes  Ambulatory status? Walker? Wheelchair?: Walker  SAFETY ISSUES: Prior radiation? Yes, cervix- 04/12/16-05/31/16 Pacemaker/ICD? No Possible current pregnancy? No Is the patient on methotrexate? No  Current Complaints / other details:  Nothing at this time.   BP (!) 168/95 (BP Location: Left Arm, Patient Position: Sitting;Standing, Cuff Size: Normal)   Pulse (!) 106   Temp 97.8 F (36.6 C)   Resp 20   Ht 5' 1.5" (1.562 m)   Wt 168 lb 6.4 oz (76.4 kg)   SpO2 100%   BMI 31.30 kg/m

## 2023-04-12 NOTE — Progress Notes (Signed)
Radiation Oncology         [phone] ________________________________  Re-Consultation Note  Name: Valerie Kerr MRN: [id number]  Date: 04/13/2023  DOB: [birth year]  QI:[id number], Odette Horns, MD  Leonidas Romberg, MD   REFERRING PHYSICIAN: Leonidas Romberg, MD  DIAGNOSIS: The primary encounter diagnosis was Metastasis to bone [facility]). A diagnosis of Situational anxiety was also pertinent to this visit.  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 female who is accompanied by her supportive daughter. she is seen as a courtesy of Dr. Horald Chestnut for an opinion concerning radiation therapy as part of management for her new CT findings, concerning for metastatic disease.   This patient is well known to [place] from her previously treated cervical cancer. She was originally diagnosed with stage IIB poorly differentiated squamous cell carcinoma of the cervix in December of 2017. She was treated with concurrent chemoradiation followed by 5 fractions of brachytherapy.   She was last seen in office on 05-19-22 for a routine follow up of her cervical cancer. No evidence of disease recurrence was seen on clinical exam at that time. She did complain of low back pain at that time which she attributed to sciatica. She was seeing an orthopedic surgeon in the [facility] area for this issue.   She had multiple ED visits in [place] first being on 01-26-23 and last on 02-21-23 for worsening left leg and back pain.   Several scans were done including an initial CT of the lumbar spine on 02-20-23 showed new  lytic mass destroying S1 vertebral body and the right sacral ala concerning for metastatic neoplasm or plasmacytoma. MRI of the lumbar spine on 02-24-23 was complete bone marrow replacement of the S1 vertebral body extending into bilateral sacral ala. Bone marrow replacement also involves majority of the S2 vertebral body, patchy areas of bone marrow replacement and enhancement of the L5 vertebral body suspicious for metastatic lesion.   A CT abdomen and pelvis on 02-26-23 showed a large lytic bone lesion is noted involving S1 vertebral body and extending into right sacral ala. Additional mixed sclerotic and lytic change noted on both sides of the bilateral sacroiliac joints, highly suspicious for malignant lesion. Corticated chronic fracture of the right pubic symphysis extends into right superior pubic ramus was indicated. Scan also revealed a 14 mm intermediate density lesion in medial aspect of interpolar region of left kidney increased in size from previous study. It may represent a complex cyst or solid mass. CT thorax done on 02-26-23 did not indicate any significant findings. Bone scan done on 02-28-23 showing diffuse increased uptake throughout the sacrum concerning for osteoblastic metastases.   Patient reportedly underwent diagnostic multiple myeloma testing which returned negative.   Biopsy was done on 02/27/23 which showed disrupted/fractured portion of the partially necrotic bone.   Today the patient states has had back pain present for quite some time, but has noticed it worsening within the past 2 months. She rates the pain 8-10 in the office today, but states it gets to 10/10 at times. She is having to take Percocet every 4  hours to control the pain. The pain starts in her left buttock and radiates down her left leg. She denies pain elsewhere in the body. She is unable to sit up straight or bend. She uses a walker to ambulate. She denies any incontinence. She states that she experiences  tingling in her left foot, but denies any lower extremity weakness.   PREVIOUS RADIATION THERAPY: Yes  Interval Since Last Radiation: 6 years, 10 months, and 10 days  04/12/2016 - 05/31/2016: 1) Pelvis/ 45 Gy in 25 fractions, 3D/15X 2) pelvic Boost/ 9 Gy in 5 fractions, Isodose Plan/15X   06/20/2016 - 07/04/2016: Cervix/ 28 Gy in 5 fractions, HDR Ir-192 (Tandem/Ring)  PAST MEDICAL HISTORY:  Past Medical History:  Diagnosis Date   Anxiety    Cervical cancer [facility]) oncologist-  dr gorsuch/ dr Roselind Messier   02-20-2016 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---  treatment concurrent chemo (week 6 on hold due to pancytopenia) and radiation therapy (external beam 04-12-2016 to 05-31-2016)  started high-dose rate brachytherapy 05-26-2016   Chemotherapy-induced thrombocytopenia    Chronic pain disorder    back,neck-- chronic methadone   Environmental allergies    allergy to dust mites   GERD (gastroesophageal reflux disease)    Headache    Herniated disc, cervical    History of external beam radiation therapy 04/12/16-05/31/16   pelvis 45 Gy in 25 fractions, in  30 sessions, pelvis boost 9 Gy in 5 fractions   History of radiation therapy 06/20/16-07/04/16   tandem and ring applicator to cervix 28 Gy in 5 fractions   Hypertension    Hypokalemia    Hypomagnesemia    po supplement and IV replacement   Leukopenia due to antineoplastic chemotherapy (HCC)    Lumbar herniated disc    Pancytopenia due to chemotherapy (HCC)    Periodontitis     PAST SURGICAL HISTORY: Past Surgical History:  Procedure Laterality Date   ABCESS DRAINAGE Left 1982   breast   CARPAL TUNNEL RELEASE Bilateral    CERVICAL DISC SURGERY  2010   MULTIPLE EXTRACTIONS WITH ALVEOLOPLASTY N/A 04/08/2016   Procedure: Extraction of tooth #'s 1,6,10,96,04 with alveoloplasty  and gross debridement of remaining teeth;  Surgeon: Charlynne Pander, DDS;  Location: [facility] OR;  Service: Oral Surgery;  Laterality: N/A;    TANDEM RING INSERTION N/A 05/26/2016   Procedure: TANDEM RING INSERTION;  Surgeon: Antony Blackbird, MD;  Location[facility];  Service: Urology;  Laterality: N/A;   TANDEM RING INSERTION N/A 06/02/2016   Procedure: TANDEM RING INSERTION;  Surgeon: Antony Blackbird, MD;  Location: [facility];  Service: Urology;  Laterality: N/A;   TANDEM RING INSERTION N/A 06/20/2016   Procedure: TANDEM RING INSERTION;  Surgeon: Antony Blackbird, MD;  Location: [facility];  Service: Urology;  Laterality: N/A;   TANDEM RING INSERTION N/A 06/23/2016   Procedure: TANDEM RING INSERTION;  Surgeon: Antony Blackbird, MD;  Location: [facility];  Service: Urology;  Laterality: N/A;   TANDEM RING INSERTION N/A 07/04/2016   Procedure: TANDEM RING INSERTION;  Surgeon: Antony Blackbird, MD;  Location: [facility];  Service: Urology;  Laterality: N/A;    FAMILY HISTORY:  Family History  Problem Relation Age of Onset   Cancer Father    Cancer Sister        throat   Cancer Brother        throat and stomach   Cancer Brother  lung    SOCIAL HISTORY:  Social History   Tobacco Use   Smoking status: Every Day    Current packs/day: 0.20    Average packs/day: 0.2 packs/day for 45.0 years (9.0 ttl pk-yrs)    Types: Cigarettes   Smokeless tobacco: Never   Tobacco comments:    1 week since last cigarette  Vaping Use   Vaping status: Never Used  Substance Use Topics   Alcohol use: No   Drug use: No    ALLERGIES:  Allergies  Allergen Reactions   Codeine Itching and Rash   Fentanyl Rash    Skin rash, local swelling from patch   Hydrocodone-Acetaminophen Rash   Tramadol Other (See Comments)    GI upset,also trembling sensation    MEDICATIONS:  Current Outpatient Medications  Medication Sig Dispense Refill   cetirizine (ZYRTEC) 10 MG tablet TAKE 1 TABLET BY MOUTH EVERY DAY 90 tablet 0   cyclobenzaprine (FLEXERIL) 10 MG tablet Take by mouth.      dexamethasone (DECADRON) 4 MG tablet Take 1 tablet (4 mg total) by mouth 2 (two) times daily with a meal. 60 tablet 0   fluticasone (FLONASE) 50 MCG/ACT nasal spray INSTILL 1 SPRAY INTO EACH NOSTRIL DAILY 48 mL 0   lidocaine (LIDODERM) 5 % Place 1 patch onto the skin daily. Remove & Discard patch within 12 hours or as directed by MD 30 patch 0   lisinopril-hydrochlorothiazide (ZESTORETIC) 20-25 MG tablet Take 1 tablet by mouth daily. 30 tablet 0   methadone (DOLOPHINE) 10 MG/5ML solution Take 70 mg by mouth daily in the afternoon.     omeprazole (PRILOSEC) 20 MG capsule Take 1 capsule (20 mg total) by mouth daily. Must have office visit for refills 90 capsule 0   prochlorperazine (COMPAZINE) 10 MG tablet Take 1 tablet (10 mg total) by mouth every 6 (six) hours as needed for nausea. 30 tablet 0   Vitamin D, Ergocalciferol, (DRISDOL) 50000 units CAPS capsule Take 1 capsule (50,000 Units total) by mouth every 7 (seven) days. (Patient taking differently: Take 50,000 Units by mouth every Saturday.) 16 capsule 0   VOLTAREN 1 % GEL Apply 4 g topically 4 (four) times daily. 100 g 0   ALPRAZolam (XANAX) 0.25 MG tablet Take 0.5 tablets (0.125 mg total) by mouth 2 (two) times daily as needed for anxiety. 30 tablet 0   levocetirizine (XYZAL) 5 MG tablet Take 5 mg by mouth at bedtime. (Patient not taking: Reported on 04/13/2023)     oxyCODONE-acetaminophen (PERCOCET/ROXICET) 5-325 MG tablet Take 1 tablet by mouth every 4 (four) hours as needed for severe pain (pain score 7-10). 30 tablet 0   No current facility-administered medications for this encounter.    REVIEW OF SYSTEMS:  Notable for that above.    PHYSICAL EXAM:  height is 5' 1.5" (1.562 m) and weight is 168 lb 6.4 oz (76.4 kg). Her temperature is 97.8 F (36.6 C). Her blood pressure is 168/95 (abnormal) and her pulse is 106 (abnormal). Her respiration is 20 and oxygen saturation is 100%.   General: Alert and oriented, in severe pain HEENT: Head is  normocephalic. Extraocular movements are intact.  Neck: Neck is supple, no obvious cervical or supraclavicular lymphadenopathy. Heart: Regular in rate and rhythm with no murmurs, rubs, or gallops. Chest: Clear to auscultation bilaterally, with no rhonchi, wheezes, or rales. Abdomen: Soft, nontender, nondistended, with no rigidity or guarding. Extremities: No cyanosis or edema. Lymphatics: no palpable inguinal lymphadenopathy.  Skin: No concerning lesions. Musculoskeletal:  Exquisite tenderness when patient lifts legs while lying down in bed. Using a walker to ambulate.  Neurologic: Cranial nerves II through XII are grossly intact. No obvious focalities. Speech is fluent. Coordination is intact. Psychiatric: Judgment and insight are intact. Affect is appropriate.  ECOG = 3  0 - Asymptomatic (Fully active, able to carry on all predisease activities without restriction)  1 - Symptomatic but completely ambulatory (Restricted in physically strenuous activity but ambulatory and able to carry out work of a light or sedentary nature. For example, light housework, office work)  2 - Symptomatic, <50% in bed during the day (Ambulatory and capable of all self care but unable to carry out any work activities. Up and about more than 50% of waking hours)  3 - Symptomatic, >50% in bed, but not bedbound (Capable of only limited self-care, confined to bed or chair 50% or more of waking hours)  4 - Bedbound (Completely disabled. Cannot carry on any self-care. Totally confined to bed or chair)  5 - Death   Santiago Glad MM, Creech RH, Tormey DC, et al. [phone]). "Toxicity and response criteria of the [facility]". Am. Evlyn Clines. Oncol. 5 (6): 649-55  LABORATORY DATA:  Lab Results  Component Value Date   WBC 7.9 02/10/2020   HGB 12.3 02/10/2020   HCT 39.2 02/10/2020   MCV 89.7 02/10/2020   PLT 265 02/10/2020   NEUTROABS 5.5 02/10/2020   Lab Results  Component Value Date   NA 140  02/11/2020   K 4.5 02/11/2020   CL 98 02/11/2020   CO2 33 (H) 02/11/2020   GLUCOSE 127 (H) 02/11/2020   BUN 18 02/11/2020   CREATININE 0.99 02/11/2020   CALCIUM 9.5 02/11/2020      RADIOGRAPHY: No results found.    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 CT of the pelvis shows a sacral lesion that is concerning for metastatic disease. Patient is unfortunately experiencing significant pain to this area which has greatly interfered with her previously good quality of life. Previous biopsy of this area demonstrated inconclusive results. We have personally discussed this case with IR who believes she is still a candidate for biopsy. If biopsy results are consistent with metastatic cervical cancer, we could treat the patient with a palliative course of radiation. We will wait for final results of biopsy prior to proceeding with treatment planning.   Patient is unable to walk up stairs to the bedroom at this time and is having to sleep on a blowup air mattress. This is not conducive for her current condition, and a hospital bed would greatly support her needs. We will get her in contact with social work to help with this.   PLAN: PET ordered to complete staging workup. Order placed for biopsy of the sacral lesion placed today. We will call the patient with the results and proceed with treatment planning as needed.   Referral to social work placed today.   Pain medication refilled.   70 minutes of total time was spent for this patient encounter, including preparation, face-to-face counseling with the patient and coordination of care, physical exam, and documentation of the encounter.    ------------------------------------------------   Bryan Lemma, PA-C   Billie Lade, PhD, MD   [facility]  Radiation  Oncology Direct Dial: [phone]  Fax: [phone] [email or web address]   This document serves as a record of services personally performed by Antony Blackbird, MD and Bryan Lemma, PA-C. It was created on his behalf by Herbie Saxon, a trained medical scribe. The creation of this record is based on the scribe's personal observations and the provider's statements to them. This document has been checked and approved by the attending provider.

## 2023-04-13 ENCOUNTER — Encounter: Payer: Self-pay | Admitting: *Deleted

## 2023-04-13 ENCOUNTER — Ambulatory Visit
Admission: RE | Admit: 2023-04-13 | Discharge: 2023-04-13 | Disposition: A | Discharge status: Home / Self Care | Payer: Medicare Other | Source: Ambulatory Visit | Attending: Radiation Oncology | Admitting: Radiation Oncology

## 2023-04-13 ENCOUNTER — Inpatient Hospital Stay
Admission: RE | Admit: 2023-04-13 | Discharge: 2023-04-13 | Disposition: A | Discharge status: Home / Self Care | Payer: Self-pay | Source: Ambulatory Visit | Attending: Radiation Oncology | Admitting: Radiation Oncology

## 2023-04-13 ENCOUNTER — Other Ambulatory Visit: Payer: Self-pay | Admitting: Radiation Oncology

## 2023-04-13 ENCOUNTER — Encounter: Payer: Self-pay | Admitting: Radiation Oncology

## 2023-04-13 ENCOUNTER — Other Ambulatory Visit (HOSPITAL_COMMUNITY): Payer: Self-pay

## 2023-04-13 ENCOUNTER — Ambulatory Visit: Payer: Self-pay | Admitting: Radiation Oncology

## 2023-04-13 VITALS — BP 168/95 | HR 106 | Temp 97.8°F | Resp 20 | Ht 61.5 in | Wt 168.4 lb

## 2023-04-13 DIAGNOSIS — C7951 Secondary malignant neoplasm of bone: Secondary | ICD-10-CM

## 2023-04-13 DIAGNOSIS — C539 Malignant neoplasm of cervix uteri, unspecified: Secondary | ICD-10-CM

## 2023-04-13 DIAGNOSIS — Z791 Long term (current) use of non-steroidal anti-inflammatories (NSAID): Secondary | ICD-10-CM | POA: Insufficient documentation

## 2023-04-13 DIAGNOSIS — Z7952 Long term (current) use of systemic steroids: Secondary | ICD-10-CM | POA: Insufficient documentation

## 2023-04-13 DIAGNOSIS — C530 Malignant neoplasm of endocervix: Secondary | ICD-10-CM

## 2023-04-13 DIAGNOSIS — M5459 Other low back pain: Secondary | ICD-10-CM | POA: Diagnosis not present

## 2023-04-13 DIAGNOSIS — I10 Essential (primary) hypertension: Secondary | ICD-10-CM | POA: Insufficient documentation

## 2023-04-13 DIAGNOSIS — F17210 Nicotine dependence, cigarettes, uncomplicated: Secondary | ICD-10-CM | POA: Insufficient documentation

## 2023-04-13 DIAGNOSIS — G893 Neoplasm related pain (acute) (chronic): Secondary | ICD-10-CM | POA: Insufficient documentation

## 2023-04-13 DIAGNOSIS — D61811 Other drug-induced pancytopenia: Secondary | ICD-10-CM | POA: Diagnosis not present

## 2023-04-13 DIAGNOSIS — Z9221 Personal history of antineoplastic chemotherapy: Secondary | ICD-10-CM | POA: Insufficient documentation

## 2023-04-13 DIAGNOSIS — T451X5A Adverse effect of antineoplastic and immunosuppressive drugs, initial encounter: Secondary | ICD-10-CM | POA: Insufficient documentation

## 2023-04-13 DIAGNOSIS — Z79899 Other long term (current) drug therapy: Secondary | ICD-10-CM | POA: Diagnosis not present

## 2023-04-13 DIAGNOSIS — K219 Gastro-esophageal reflux disease without esophagitis: Secondary | ICD-10-CM | POA: Insufficient documentation

## 2023-04-13 DIAGNOSIS — Z923 Personal history of irradiation: Secondary | ICD-10-CM | POA: Insufficient documentation

## 2023-04-13 DIAGNOSIS — E876 Hypokalemia: Secondary | ICD-10-CM | POA: Insufficient documentation

## 2023-04-13 DIAGNOSIS — F418 Other specified anxiety disorders: Secondary | ICD-10-CM

## 2023-04-13 MED ORDER — OXYCODONE-ACETAMINOPHEN 5-325 MG PO TABS
1.0000 | ORAL_TABLET | ORAL | 0 refills | Status: DC | PRN
  Filled 2023-04-13: qty 30, 5d supply, fill #0

## 2023-04-13 MED ORDER — DEXAMETHASONE 4 MG PO TABS
4.0000 mg | ORAL_TABLET | Freq: Two times a day (BID) | ORAL | 0 refills | Status: DC
  Filled 2023-04-13: qty 60, 30d supply, fill #0

## 2023-04-13 MED ORDER — OXYCODONE-ACETAMINOPHEN 5-325 MG PO TABS: 1.0000 | ORAL_TABLET | Freq: Four times a day (QID) | ORAL | 0 refills | Status: DC | PRN

## 2023-04-13 MED ORDER — ALPRAZOLAM 0.25 MG PO TABS
0.1250 mg | ORAL_TABLET | Freq: Two times a day (BID) | ORAL | 0 refills | Status: DC | PRN
  Filled 2023-04-13: qty 30, 30d supply, fill #0

## 2023-04-14 ENCOUNTER — Other Ambulatory Visit: Payer: Self-pay | Admitting: Radiology

## 2023-04-14 DIAGNOSIS — C7951 Secondary malignant neoplasm of bone: Secondary | ICD-10-CM

## 2023-04-14 NOTE — Progress Notes (Signed)
Sterling Big, MD  Claudean Kinds Approved for CT guided bx of right sacral lesion.  HKM       Previous Messages    ----- Message ----- From: Claudean Kinds Sent: 04/14/2023   8:42 AM EST To: Claudean Kinds; Ir Procedure Requests Subject: CT Biopsy                                      Procedure : Ct Biopsy  Reason: history of cervical cancer Dx: Metastasis to bone (HCC) [C79.51 (ICD-10-CM)]  Ordering Comments  Please biopsy the sacral lesion seen on previous imaging. PET has been ordered   History : CT , MRI and NM whole body imaging in PACS   Provider : Erven Colla, PA-C  Provider contact :[phone]

## 2023-04-17 ENCOUNTER — Encounter (HOSPITAL_COMMUNITY): Payer: Self-pay

## 2023-04-17 ENCOUNTER — Telehealth: Payer: Self-pay | Admitting: *Deleted

## 2023-04-17 ENCOUNTER — Ambulatory Visit (HOSPITAL_COMMUNITY): Payer: Medicare Other

## 2023-04-17 ENCOUNTER — Encounter: Payer: Self-pay | Admitting: Licensed Clinical Social Worker

## 2023-04-17 NOTE — Telephone Encounter (Signed)
XXXXX

## 2023-04-17 NOTE — Telephone Encounter (Signed)
Called patient to inform that scan has been denied and has been cancelled for today, lvm for a return call

## 2023-04-17 NOTE — Progress Notes (Signed)
[facility] Clinical Social Work  Clinical Social Work was referred by medical provider for hospital bed/ DME.  DME order must come from medical provider. Communicated with medical team who will order bed in order to have coverage from insurance.      Allie Ousley E Roben Tatsch, LCSW  Clinical Social Worker [facility]

## 2023-04-17 NOTE — Telephone Encounter (Signed)
Called patient's daughter- Valerie Kerr, to inform Pet Scan has been denied by insurance and cancelled due to that, once appeal has been done, it will be rescheduled, lvm for a return call

## 2023-04-18 ENCOUNTER — Telehealth: Payer: Self-pay | Admitting: *Deleted

## 2023-04-18 NOTE — Telephone Encounter (Signed)
Called patient to inform of CT for 04-19-23- arrival time- 1:45 pm @ [facility], patient to have water only- 6 hrs. prior to scan, spoke with patient and she is aware of this scan and the instructions

## 2023-04-19 ENCOUNTER — Encounter (HOSPITAL_COMMUNITY)
Admission: RE | Admit: 2023-04-19 | Discharge: 2023-04-19 | Disposition: A | Discharge status: Home / Self Care | Payer: Medicare Other | Source: Ambulatory Visit | Attending: Radiology | Admitting: Radiology

## 2023-04-19 DIAGNOSIS — C7951 Secondary malignant neoplasm of bone: Secondary | ICD-10-CM | POA: Diagnosis present

## 2023-04-19 LAB — GLUCOSE, CAPILLARY: Glucose-Capillary: 149 mg/dL — ABNORMAL HIGH (ref 70–99)

## 2023-04-19 MED ORDER — FLUDEOXYGLUCOSE F - 18 (FDG) INJECTION
8.3900 | Freq: Once | INTRAVENOUS | Status: AC | PRN
  Administered 2023-04-19: 8.39 via INTRAVENOUS

## 2023-04-20 ENCOUNTER — Telehealth: Payer: Self-pay

## 2023-04-20 ENCOUNTER — Other Ambulatory Visit: Payer: Self-pay | Admitting: Radiation Oncology

## 2023-04-20 ENCOUNTER — Other Ambulatory Visit (HOSPITAL_COMMUNITY): Payer: Self-pay

## 2023-04-20 MED ORDER — OXYCODONE-ACETAMINOPHEN 10-325 MG PO TABS
1.0000 | ORAL_TABLET | Freq: Four times a day (QID) | ORAL | 0 refills | Status: DC | PRN
  Filled 2023-04-20: qty 30, 8d supply, fill #0

## 2023-04-20 NOTE — Telephone Encounter (Signed)
Patient called in requesting refill on Oxycodone-APAP. Patient is requesting prescription be changed to oxycodone-apap 10-325mg  due to not much relief with 5-325mg . Patient requesting medication be sent to [facility].

## 2023-04-21 ENCOUNTER — Other Ambulatory Visit (HOSPITAL_COMMUNITY): Payer: Self-pay

## 2023-04-25 NOTE — H&P (Signed)
Chief Complaint: Patient was seen in consultation today for right sacral lesion at the request of Erven Colla  Referring Physician(s): Erven Colla  Supervising Physician: Gilmer Mor  Patient Status: [facility] - Out-pt  History of Present Illness: Valerie Kerr is a 63 y.o. female with PMHs of HTN, cervical CA s/p chemoradiation and new right sacral lesion who presents for bx.   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 Patient developed left leg and back pain, underwent numerous imaging in [place] which showed new lytic mass destroying S1 vertebral body and the right sacral ala concerning for metastatic neoplasm or plasmacytoma.   Patient was seen by radiation oncology on 04/13/23, underwent PET on 04/19/23 which result is pending. IR was requested for image guided biopsy, case was reviewed and approved for CT guided right sacral lesion bx by Dr. Archer Asa.   Patient laying in bed, not in acute distress.  Denise headache, fever, chills, shortness of breath, cough, chest pain, abdominal pain, nausea ,vomiting, and bleeding.  Patient appears sleepy, falls asleep during the assessment.  She was able to follow commands, verified that the patient understands risks of the procedure by teach back method.  She was informed that she may need to lay on her belly on her left side, she states that she cannot lay on her belly but will try er best to lay on her left side.   Past Medical History:  Diagnosis Date   Anxiety    Cervical cancer [facility]) oncologist-  dr gorsuch/ dr Roselind Messier   02-20-2016 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---  treatment concurrent chemo (week 6 on hold due to pancytopenia) and radiation therapy (external beam 04-12-2016 to 05-31-2016)  started high-dose rate brachytherapy 05-26-2016   Chemotherapy-induced thrombocytopenia    Chronic pain disorder    back,neck--  chronic methadone   Environmental allergies    allergy to dust mites   GERD (gastroesophageal reflux disease)    Headache    Herniated disc, cervical    History of external beam radiation therapy 04/12/16-05/31/16   pelvis 45 Gy in 25 fractions, in  30 sessions, pelvis boost 9 Gy in 5 fractions   History of radiation therapy 06/20/16-07/04/16   tandem and ring applicator to cervix 28 Gy in 5 fractions   Hypertension    Hypokalemia    Hypomagnesemia    po supplement and IV replacement   Leukopenia due to antineoplastic chemotherapy (HCC)    Lumbar herniated disc    Pancytopenia due to chemotherapy (HCC)    Periodontitis     Past Surgical History:  Procedure Laterality Date   ABCESS DRAINAGE Left 1982   breast   CARPAL TUNNEL RELEASE Bilateral    CERVICAL DISC SURGERY  2010   MULTIPLE EXTRACTIONS WITH ALVEOLOPLASTY N/A 04/08/2016   Procedure: Extraction of tooth #'s 1,3,08,65,78 with alveoloplasty  and gross debridement of remaining teeth;  Surgeon: Charlynne Pander, DDS;  Location: [facility] OR;  Service: Oral Surgery;  Laterality: N/A;   TANDEM RING INSERTION N/A 05/26/2016   Procedure: TANDEM RING INSERTION;  Surgeon: Antony Blackbird, MD;  Location: [facility];  Service: Urology;  Laterality: N/A;   TANDEM RING INSERTION N/A 06/02/2016   Procedure: TANDEM RING INSERTION;  Surgeon: Antony Blackbird, MD;  Location: [facility];  Service: Urology;  Laterality: N/A;   TANDEM RING INSERTION N/A 06/20/2016   Procedure: TANDEM RING INSERTION;  Surgeon: Antony Blackbird, MD;  Location: [facility];  Service: Urology;  Laterality: N/A;   TANDEM RING INSERTION N/A 06/23/2016   Procedure: TANDEM RING INSERTION;  Surgeon: Antony Blackbird, MD;  Location: [facility];  Service: Urology;  Laterality: N/A;   TANDEM RING INSERTION N/A 07/04/2016   Procedure: TANDEM RING INSERTION;  Surgeon: Antony Blackbird, MD;  Location: [facility];  Service:  Urology;  Laterality: N/A;    Allergies: Codeine, Fentanyl, Hydrocodone-acetaminophen, and Tramadol  Medications: Prior to Admission medications   Medication Sig Start Date End Date Taking? Authorizing Provider  ALPRAZolam (XANAX) 0.25 MG tablet Take 0.5 tablets (0.125 mg total) by mouth 2 (two) times daily as needed for anxiety. 04/13/23   Antony Blackbird, MD  cetirizine (ZYRTEC) 10 MG tablet TAKE 1 TABLET BY MOUTH EVERY DAY 04/09/19   Hoy Register, MD  cyclobenzaprine (FLEXERIL) 10 MG tablet Take by mouth. 05/16/19   [provider]  dexamethasone (DECADRON) 4 MG tablet Take 1 tablet (4 mg total) by mouth 2 (two) times daily with a meal. 04/13/23   Antony Blackbird, MD  fluticasone (FLONASE) 50 MCG/ACT nasal spray INSTILL 1 SPRAY INTO EACH NOSTRIL DAILY 04/09/19   Hoy Register, MD  levocetirizine (XYZAL) 5 MG tablet Take 5 mg by mouth at bedtime. Patient not taking: Reported on 04/13/2023 11/21/19   [provider]  lidocaine (LIDODERM) 5 % Place 1 patch onto the skin daily. Remove & Discard patch within 12 hours or as directed by MD 08/17/18   Marcine Matar, MD  lisinopril-hydrochlorothiazide (ZESTORETIC) 20-25 MG tablet Take 1 tablet by mouth daily. 12/31/18   Hoy Register, MD  methadone (DOLOPHINE) 10 MG/5ML solution Take 70 mg by mouth daily in the afternoon.    [provider]  omeprazole (PRILOSEC) 20 MG capsule Take 1 capsule (20 mg total) by mouth daily. Must have office visit for refills 05/02/19   Hoy Register, MD  oxyCODONE-acetaminophen (PERCOCET) 10-325 MG tablet Take 1 tablet by mouth every 6 (six) hours as needed for pain. 04/20/23   Antony Blackbird, MD  prochlorperazine (COMPAZINE) 10 MG tablet Take 1 tablet (10 mg total) by mouth every 6 (six) hours as needed for nausea. 11/09/20   Antony Blackbird, MD  Vitamin D, Ergocalciferol, (DRISDOL) 50000 units CAPS capsule Take 1 capsule (50,000 Units total) by mouth every 7 (seven) days. Patient taking  differently: Take 50,000 Units by mouth every Saturday. 10/26/17   Anders Simmonds, PA-C  VOLTAREN 1 % GEL Apply 4 g topically 4 (four) times daily. 03/31/17   Hoy Register, MD     Family History  Problem Relation Age of Onset   Cancer Father    Cancer Sister        throat   Cancer Brother        throat and stomach   Cancer Brother        lung    Social History   Socioeconomic History   Marital status: Divorced    Spouse name: Not on file   Number of children: 5   Years of education: Not on file   Highest education level: Not on file  Occupational History   Occupation: disabled  Tobacco Use   Smoking status: Every Day    Current packs/day: 0.20    Average packs/day: 0.2 packs/day for 45.0 years (9.0 ttl pk-yrs)    Types: Cigarettes   Smokeless tobacco: Never   Tobacco comments:    1 week since last cigarette  Vaping Use  Vaping status: Never Used  Substance and Sexual Activity   Alcohol use: No   Drug use: No   Sexual activity: Not Currently  Other Topics Concern   Not on file  Social History Narrative   Lives with niece in a 2 story home.  Has 5 children.  On disability.  Formerly worked in Stage manager at [employer].  Education: 11th grade.    Social Drivers of Corporate investment banker Strain: Not on file  Food Insecurity: Food Insecurity Present (04/14/2023)   Hunger Vital Sign    Worried About Running Out of Food in the Last Year: Sometimes true    Ran Out of Food in the Last Year: Sometimes true  Transportation Needs: No Transportation Needs (04/19/2023)   Received from [facility]   OASIS A1250: Transportation    Lack of Transportation (Medical): No    Lack of Transportation (Non-Medical): No    Patient Unable or Declines to Respond: No  Physical Activity: Not on file  Stress: Not on file  Social Connections: Feeling Socially Integrated (04/19/2023)   Received from [facility]   OASIS D0700: Social Isolation    Frequency  of experiencing loneliness or isolation: Never     Review of Systems: A 12 point ROS discussed and pertinent positives are indicated in the HPI above.  All other systems are negative.  Vital Signs: BP 130/69   Pulse (!) 109   Temp 99 F (37.2 C) (Oral)   Resp 18   Ht 5' 1.5" (1.562 m)   Wt 168 lb (76.2 kg)   SpO2 98%   BMI 31.23 kg/m    Physical Exam Vitals and nursing note reviewed.  Constitutional:      General: Patient is not in acute distress.    Appearance: Appears sleepy.  HENT:     Head: Normocephalic and atraumatic.     Mouth/Throat:     Mouth: Mucous membranes are moist.     Pharynx: Oropharynx is clear.  Cardiovascular:     Rate and Rhythm: Normal rate and regular rhythm.     Pulses: Normal pulses.     Heart sounds: Normal heart sounds.  Pulmonary:     Effort: Pulmonary effort is normal.     Breath sounds: Normal breath sounds.  Abdominal:     General: Abdomen is flat. Bowel sounds are normal.     Palpations: Abdomen is soft.  Musculoskeletal:     Cervical back: Neck supple.  Skin:    General: Skin is warm and dry.     Coloration: Skin is not jaundiced or pale.  Neurological:     Mental Status: Patient is alert and oriented to person, place, and time.  Psychiatric:        Mood and Affect: Mood normal.        Behavior: Behavior normal.        Judgment: Judgment normal.     MD Evaluation Airway: WNL Heart: WNL Abdomen: WNL Chest/ Lungs: WNL ASA  Classification: 3 Mallampati/Airway Score: Two  Imaging: No results found.  Labs:  CBC: No results for input(s): "WBC", "HGB", "HCT", "PLT" in the last 8760 hours.  COAGS: No results for input(s): "INR", "APTT" in the last 8760 hours.  BMP: No results for input(s): "NA", "K", "CL", "CO2", "GLUCOSE", "BUN", "CALCIUM", "CREATININE", "GFRNONAA", "GFRAA" in the last 8760 hours.  Invalid input(s): "CMP"  LIVER FUNCTION TESTS: No results for input(s): "BILITOT", "AST", "ALT", "ALKPHOS", "PROT",  "ALBUMIN" in the last 8760  hours.  TUMOR MARKERS: No results for input(s): "AFPTM", "CEA", "CA199", "CHROMGRNA" in the last 8760 hours.  Assessment and Plan: 63 y.o. female with hx of cervical CA with new right sacral lesion who presents for bx.   NPO since MN VS tachycardic, 109 pt asymptomatic  Not on AC/AP Allergies reviewed, pt had allergic reaction to Fentanyl path, caused local skin rash - Discuss with Dr. Loreta Ave, OK to proceed with Fentanyl, may switch to dilaudid if needed   Risks and benefits of right sacral lesion bx  was discussed with the patient and/or patient's family including, but not limited to bleeding, infection, damage to adjacent structures or low yield requiring additional tests.  All of the questions were answered and there is agreement to proceed.  Consent signed and in chart.   Thank you for this interesting consult.  I greatly enjoyed meeting Orra Nolde and look forward to participating in their care.  A copy of this report was sent to the requesting provider on this date.  Electronically Signed: Willette Brace, PA-C 04/27/2023, 9:34 AM   I spent a total of  30 Minutes   in face to face in clinical consultation, greater than 50% of which was counseling/coordinating care for right sacral lesion bx.   This chart was dictated using voice recognition software.  Despite best efforts to proofread,  errors can occur which can change the documentation meaning.

## 2023-04-26 ENCOUNTER — Other Ambulatory Visit (HOSPITAL_COMMUNITY): Payer: Self-pay | Admitting: Student

## 2023-04-26 ENCOUNTER — Other Ambulatory Visit: Payer: Self-pay | Admitting: Student

## 2023-04-27 ENCOUNTER — Telehealth: Payer: Self-pay

## 2023-04-27 ENCOUNTER — Other Ambulatory Visit (HOSPITAL_COMMUNITY): Payer: Self-pay

## 2023-04-27 ENCOUNTER — Other Ambulatory Visit: Payer: Self-pay | Admitting: Radiation Oncology

## 2023-04-27 ENCOUNTER — Other Ambulatory Visit: Payer: Self-pay

## 2023-04-27 ENCOUNTER — Ambulatory Visit (HOSPITAL_COMMUNITY)
Admission: RE | Admit: 2023-04-27 | Discharge: 2023-04-27 | Disposition: A | Discharge status: Home / Self Care | Payer: Medicare Other | Source: Ambulatory Visit | Attending: Radiology | Admitting: Radiology

## 2023-04-27 ENCOUNTER — Encounter (HOSPITAL_COMMUNITY): Payer: Self-pay

## 2023-04-27 DIAGNOSIS — M533 Sacrococcygeal disorders, not elsewhere classified: Secondary | ICD-10-CM | POA: Diagnosis present

## 2023-04-27 DIAGNOSIS — Z923 Personal history of irradiation: Secondary | ICD-10-CM | POA: Insufficient documentation

## 2023-04-27 DIAGNOSIS — Z8541 Personal history of malignant neoplasm of cervix uteri: Secondary | ICD-10-CM | POA: Insufficient documentation

## 2023-04-27 DIAGNOSIS — F17210 Nicotine dependence, cigarettes, uncomplicated: Secondary | ICD-10-CM | POA: Insufficient documentation

## 2023-04-27 DIAGNOSIS — Z9221 Personal history of antineoplastic chemotherapy: Secondary | ICD-10-CM | POA: Insufficient documentation

## 2023-04-27 DIAGNOSIS — C7951 Secondary malignant neoplasm of bone: Secondary | ICD-10-CM | POA: Diagnosis not present

## 2023-04-27 DIAGNOSIS — I10 Essential (primary) hypertension: Secondary | ICD-10-CM | POA: Insufficient documentation

## 2023-04-27 MED ORDER — MIDAZOLAM HCL 2 MG/2ML IJ SOLN
INTRAMUSCULAR | Status: AC | PRN
  Administered 2023-04-27 (×2): .5 mg via INTRAVENOUS

## 2023-04-27 MED ORDER — LIDOCAINE HCL 1 % IJ SOLN
10.0000 mL | Freq: Once | INTRAMUSCULAR | Status: AC
  Administered 2023-04-27: 10 mL via INTRADERMAL

## 2023-04-27 MED ORDER — FENTANYL CITRATE (PF) 100 MCG/2ML IJ SOLN
INTRAMUSCULAR | Status: AC
  Filled 2023-04-27: qty 4

## 2023-04-27 MED ORDER — MIDAZOLAM HCL 2 MG/2ML IJ SOLN
INTRAMUSCULAR | Status: AC
  Filled 2023-04-27: qty 4

## 2023-04-27 MED ORDER — FENTANYL CITRATE (PF) 100 MCG/2ML IJ SOLN
INTRAMUSCULAR | Status: AC | PRN
  Administered 2023-04-27 (×2): 50 ug via INTRAVENOUS

## 2023-04-27 MED ORDER — OXYCODONE-ACETAMINOPHEN 5-325 MG PO TABS
1.0000 | ORAL_TABLET | Freq: Four times a day (QID) | ORAL | 0 refills | Status: DC | PRN
  Filled 2023-04-27: qty 30, 8d supply, fill #0

## 2023-04-27 NOTE — Telephone Encounter (Signed)
Patient called in requesting refill on Oxycodone-apap 5-325mg  Patient is requesting to change back to the 5-325mg  tablet. Requesting prescription be sent to [facility].   Patient also requesting order for hospital bed.

## 2023-04-27 NOTE — Procedures (Signed)
Interventional Radiology Procedure Note  Procedure:   CT guided biopsy of lytic sacral mass  Complications: None EBL: None Recommendations: - Bedrest 1 hours.   - Routine wound care - Follow up pathology - Advance diet   Signed,  Gilmer Mor, DO

## 2023-05-03 ENCOUNTER — Ambulatory Visit (HOSPITAL_COMMUNITY): Payer: Medicare Other

## 2023-05-03 LAB — SURGICAL PATHOLOGY

## 2023-05-05 ENCOUNTER — Other Ambulatory Visit (HOSPITAL_COMMUNITY): Payer: Self-pay

## 2023-05-05 ENCOUNTER — Other Ambulatory Visit: Payer: Self-pay | Admitting: Radiology

## 2023-05-05 DIAGNOSIS — F418 Other specified anxiety disorders: Secondary | ICD-10-CM

## 2023-05-05 MED ORDER — OXYCODONE-ACETAMINOPHEN 5-325 MG PO TABS
1.0000 | ORAL_TABLET | Freq: Four times a day (QID) | ORAL | 0 refills | Status: DC | PRN
  Filled 2023-05-05: qty 30, 8d supply, fill #0

## 2023-05-05 MED ORDER — ALPRAZOLAM 0.25 MG PO TABS
0.1250 mg | ORAL_TABLET | Freq: Two times a day (BID) | ORAL | 0 refills | Status: DC | PRN
  Filled 2023-05-05 (×2): qty 30, 30d supply, fill #0

## 2023-05-05 NOTE — Progress Notes (Signed)
 I called the patient to review the results of her biopsy and most recent PET scan. 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 I will review the treatment plan with Dr. Shannon on Monday when he returns and share our discussion with her. She is out of pain and anxiety medications, so a refill for these was sent to her pharmacy today. She was appreciative of the call and expressed understanding of the stated plan.    Leeroy Due, PA-C

## 2023-05-06 ENCOUNTER — Other Ambulatory Visit (HOSPITAL_COMMUNITY): Payer: Self-pay

## 2023-05-09 ENCOUNTER — Telehealth: Payer: Self-pay | Admitting: Licensed Clinical Social Worker

## 2023-05-09 ENCOUNTER — Other Ambulatory Visit: Payer: Self-pay

## 2023-05-09 ENCOUNTER — Telehealth: Payer: Self-pay

## 2023-05-09 DIAGNOSIS — C7951 Secondary malignant neoplasm of bone: Secondary | ICD-10-CM

## 2023-05-09 NOTE — Progress Notes (Signed)
Sent transportation referral per Avilyn Virtue's request.

## 2023-05-09 NOTE — Telephone Encounter (Signed)
[facility] Clinical Social Work  CSW received message from RN that pt's daughter, Andree Elk, requested to speak with me. CSW called pt's daughter who discussed needing a hospital bed. CSW had previously communicated with medical team that DME order needs to come from medical provider. CSW ensured daughter that CSW would follow-up to ensure order was placed.  Provided direct contact to daughter for future needs.  CSW communicated with RN Schuyler Amor who stated that orders have been placed for the hospital bed.    Peggie Hornak E Liviana Mills, LCSW

## 2023-05-09 NOTE — Telephone Encounter (Signed)
Called and scheduled app on 2/18 at 2 pm. She is aware of appt and will arrive 20 mins early.

## 2023-05-09 NOTE — Telephone Encounter (Signed)
Called and left a message asking her to call the office back. Dr. Bertis Ruddy is offering appt on 2/18 or 2/20 at 2 pm for 45 mins to discuss chemo.

## 2023-05-10 ENCOUNTER — Telehealth: Payer: Self-pay | Admitting: Radiation Oncology

## 2023-05-10 NOTE — Telephone Encounter (Signed)
Informed [facility] of order for DME on 2/11, awaiting for order to be scheduled.

## 2023-05-11 ENCOUNTER — Telehealth: Payer: Self-pay | Admitting: Licensed Clinical Social Worker

## 2023-05-11 NOTE — Telephone Encounter (Signed)
[facility] Clinical Social Work  Clinical Social Work received message from [employer] that pt called asking about gas cards.  Clinical Social Worker attempted to contact patient by phone to assess eligibility for [employer] prior to referring to [facility].   No answer. Left VM with direct contact information and information needed for grant.     Ramin Zoll E Sajid Ruppert, LCSW  Clinical Social Worker [facility]

## 2023-05-11 NOTE — Telephone Encounter (Signed)
TC from pt's daughter, [name], asking about status of hospital bed. CSW realyed that nursing team has spoken with [employer] [facility] but I will need to ask nursing team for any further updates.  CSW spoke with pt regarding gas card request. Pt verbally meets income requirements and receives SNAP benefits. CSW sending referral to [facility]ock for [employer] and informed pt that she will need to bring documents confirming income and SNAP.   Lenton Gendreau E Fronie Holstein, LCSW

## 2023-05-12 ENCOUNTER — Ambulatory Visit
Admission: RE | Admit: 2023-05-12 | Discharge: 2023-05-12 | Disposition: A | Discharge status: Home / Self Care | Payer: Medicare Other | Source: Ambulatory Visit | Attending: Radiation Oncology | Admitting: Radiation Oncology

## 2023-05-12 ENCOUNTER — Other Ambulatory Visit: Payer: Self-pay | Admitting: Radiology

## 2023-05-12 ENCOUNTER — Other Ambulatory Visit (HOSPITAL_COMMUNITY): Payer: Self-pay

## 2023-05-12 DIAGNOSIS — Z510 Encounter for antineoplastic radiation therapy: Secondary | ICD-10-CM | POA: Insufficient documentation

## 2023-05-12 DIAGNOSIS — C7951 Secondary malignant neoplasm of bone: Secondary | ICD-10-CM

## 2023-05-12 DIAGNOSIS — C414 Malignant neoplasm of pelvic bones, sacrum and coccyx: Secondary | ICD-10-CM | POA: Diagnosis not present

## 2023-05-12 MED ORDER — OXYCODONE-ACETAMINOPHEN 5-325 MG PO TABS
1.0000 | ORAL_TABLET | Freq: Four times a day (QID) | ORAL | 0 refills | Status: DC | PRN
Start: 2023-05-12 — End: 2023-05-16
  Filled 2023-05-12: qty 30, 8d supply, fill #0

## 2023-05-15 ENCOUNTER — Telehealth: Payer: Self-pay | Admitting: Radiation Oncology

## 2023-05-15 NOTE — Telephone Encounter (Signed)
DME (hospital bed) delivered 05/11/22.

## 2023-05-16 ENCOUNTER — Encounter: Payer: Self-pay | Admitting: Hematology and Oncology

## 2023-05-16 ENCOUNTER — Other Ambulatory Visit: Payer: Self-pay

## 2023-05-16 ENCOUNTER — Other Ambulatory Visit (HOSPITAL_COMMUNITY): Payer: Self-pay

## 2023-05-16 ENCOUNTER — Inpatient Hospital Stay: Payer: Medicare Other | Attending: Hematology and Oncology | Admitting: Hematology and Oncology

## 2023-05-16 VITALS — BP 131/71 | HR 107 | Temp 98.2°F | Resp 18 | Ht 61.5 in | Wt 162.6 lb

## 2023-05-16 DIAGNOSIS — I10 Essential (primary) hypertension: Secondary | ICD-10-CM | POA: Diagnosis not present

## 2023-05-16 DIAGNOSIS — G893 Neoplasm related pain (acute) (chronic): Secondary | ICD-10-CM | POA: Insufficient documentation

## 2023-05-16 DIAGNOSIS — Z510 Encounter for antineoplastic radiation therapy: Secondary | ICD-10-CM | POA: Diagnosis not present

## 2023-05-16 DIAGNOSIS — Z8541 Personal history of malignant neoplasm of cervix uteri: Secondary | ICD-10-CM | POA: Diagnosis not present

## 2023-05-16 DIAGNOSIS — C499 Malignant neoplasm of connective and soft tissue, unspecified: Secondary | ICD-10-CM | POA: Insufficient documentation

## 2023-05-16 DIAGNOSIS — Z7189 Other specified counseling: Secondary | ICD-10-CM | POA: Insufficient documentation

## 2023-05-16 DIAGNOSIS — Z800 Family history of malignant neoplasm of digestive organs: Secondary | ICD-10-CM | POA: Insufficient documentation

## 2023-05-16 DIAGNOSIS — C414 Malignant neoplasm of pelvic bones, sacrum and coccyx: Secondary | ICD-10-CM | POA: Insufficient documentation

## 2023-05-16 DIAGNOSIS — F17210 Nicotine dependence, cigarettes, uncomplicated: Secondary | ICD-10-CM | POA: Insufficient documentation

## 2023-05-16 DIAGNOSIS — Z801 Family history of malignant neoplasm of trachea, bronchus and lung: Secondary | ICD-10-CM | POA: Insufficient documentation

## 2023-05-16 DIAGNOSIS — R5381 Other malaise: Secondary | ICD-10-CM | POA: Insufficient documentation

## 2023-05-16 MED ORDER — OXYCODONE HCL 15 MG PO TABS
15.0000 mg | ORAL_TABLET | ORAL | 0 refills | Status: DC | PRN
  Filled 2023-05-16: qty 90, 15d supply, fill #0

## 2023-05-16 MED ORDER — METHADONE HCL 10 MG PO TABS
40.0000 mg | ORAL_TABLET | Freq: Two times a day (BID) | ORAL | 0 refills | Status: DC
  Filled 2023-05-16: qty 90, 11d supply, fill #0

## 2023-05-16 NOTE — Assessment & Plan Note (Signed)
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

## 2023-05-16 NOTE — Assessment & Plan Note (Signed)
Counseled and coordinated advanced care planning today.  Patient is aware she has stage IV disease and disease is incurable. Treatment offered is palliative in nature. We discussed a realistic and effective plan that will be reviewed and updated on an ongoing basis as indicated. We discussed the following: Risks, benefits and alternatives to the various treatment options Discussed patient's personal belief/values/goals Discussed palliative care options, ways to avoid hospitalization and patient's desire for care if decision making capacity is affected Discussed palliative care consult and Code Status

## 2023-05-16 NOTE — Assessment & Plan Note (Signed)
She has poorly controlled pain I will discontinue liquid methadone and switch her to oral methadone 40 mg twice daily I will discontinue Percocet and switch her to oxycodone 15 mg as needed I warned her about risk of sedation and constipation I will reassess pain control next week We will consult palliative care for pain management

## 2023-05-16 NOTE — Progress Notes (Signed)
START OFF PATHWAY REGIMEN - Uterine   [id number]:Doxorubicin 75 mg/m2 IV D1 q21 Days:   A cycle is every 21 days:     Doxorubicin   **Always confirm dose/schedule in your pharmacy ordering system**  Patient Characteristics: High Grade Undifferentiated/Leiomyosarcoma, Newly Diagnosed (Clinical Staging), Nonsurgical Candidate Histology: High Grade Undifferentiated/Leiomyosarcoma Therapeutic Status: Newly Diagnosed (Clinical Staging) AJCC T Category: cT3 Surgical Candidacy: Nonsurgical Candidate AJCC N Category: cN0 AJCC 8 Stage Grouping: IVB AJCC M Category: pM1 Intent of Therapy: Non-Curative / Palliative Intent, Discussed with Patient

## 2023-05-16 NOTE — Assessment & Plan Note (Signed)
She is profoundly debilitated due to the status of her cancer I completed paperwork to help her apply for disability parking permit Will consult social worker for additional resources Will request the patient to be put in infusion bed for future treatment

## 2023-05-16 NOTE — Progress Notes (Signed)
ALERT: A disease instance has been permanently removed from this patient's pathway record and replaced with a new disease instance. Information on the new disease instance will be transmitted in a separate message.  Disease Being Removed: [Other Dx]  Reason for Removal: Reason not listed 

## 2023-05-16 NOTE — Progress Notes (Signed)
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[facility]) I reviewed imaging study findings with the patient and family The patient has stage IV disease The disease is not curable but treatable She is started on radiation treatment I recommend port placement and echocardiogram  I recommend palliative chemotherapy with single agent doxorubicin We discussed risk, benefits, side effects of treatment I will discuss this further with the patient and family next week Today, we will focus on supportive care  Cancer associated pain She has poorly controlled pain I will discontinue liquid methadone and switch her to oral methadone 40 mg twice daily I will discontinue Percocet and switch her to oxycodone 15 mg as needed I warned her about risk of sedation and constipation I will reassess pain control next week We will consult palliative care for pain management  Physical debility She is profoundly debilitated due to the status of her cancer I completed paperwork to help her apply for disability parking permit Will consult social worker for additional resources Will request the patient to be put in infusion bed for future treatment  Goals of care, counseling/discussion Counseled and coordinated advanced care planning today.  Patient is aware she has stage IV disease and disease is incurable. Treatment offered is palliative in nature. We discussed a realistic and effective plan that will be reviewed and updated on an ongoing basis as indicated. We discussed the following: Risks, benefits and alternatives to the various treatment options Discussed patient's personal belief/values/goals Discussed palliative care options, ways to avoid hospitalization and patient's desire for care if decision making capacity is affected Discussed palliative care consult and Code Status   Orders Placed This  Encounter  Procedures   IR IMAGING GUIDED PORT INSERTION    Standing Status:   Future    Expected Date:   05/23/2023    Expiration Date:   05/15/2024    Reason for Exam (SYMPTOM  OR DIAGNOSIS REQUIRED):   need port for chemo    Preferred Imaging Location?:   [facility]   CBC with Differential (Cancer Center Only)    Standing Status:   Future    Expected Date:   05/30/2023    Expiration Date:   05/29/2024   CMP (Cancer Center only)    Standing Status:   Future    Expected Date:   05/30/2023    Expiration Date:   05/29/2024   CBC with Differential (Cancer Center Only)    Standing Status:   Future    Expected Date:   06/20/2023    Expiration Date:   06/19/2024   CMP (Cancer Center only)    Standing Status:   Future    Expected Date:   06/20/2023    Expiration Date:   06/19/2024   CBC with Differential (Cancer Center Only)    Standing Status:   Future    Expected Date:   07/11/2023    Expiration Date:   07/10/2024   CMP (Cancer Center only)    Standing Status:   Future    Expected Date:   07/11/2023    Expiration Date:   07/10/2024   CBC with Differential (Cancer Center Only)    Standing Status:   Future    Expected Date:   08/01/2023    Expiration Date:   07/31/2024   CMP (Cancer Center only)    Standing Status:   Future    Expected  Date:   08/01/2023    Expiration Date:   07/31/2024   CBC with Differential (Cancer Center Only)    Standing Status:   Future    Expected Date:   08/22/2023    Expiration Date:   08/21/2024   CMP (Cancer Center only)    Standing Status:   Future    Expected Date:   08/22/2023    Expiration Date:   08/21/2024   CBC with Differential (Cancer Center Only)    Standing Status:   Future    Expected Date:   09/12/2023    Expiration Date:   09/11/2024   CMP (Cancer Center only)    Standing Status:   Future    Expected Date:   09/12/2023    Expiration Date:   09/11/2024   ECHOCARDIOGRAM COMPLETE    Standing Status:   Future    Expected Date:   05/23/2023     Expiration Date:   05/15/2024    Perflutren DEFINITY (image enhancing agent) should be administered unless hypersensitivity or allergy exist:   Administer Perflutren    Where should this test be performed:   [facility]    Reason for exam-Echo:   Chemo  Z09    The total time spent in the appointment was 80 minutes encounter with patients including review of chart and various tests results, discussions about plan of care and coordination of care plan   All questions were answered. The patient knows to call the clinic with any problems, questions or concerns. No barriers to learning was detected.  [name], MD 2/18/20253:44 PM  CHIEF COMPLAINTS/PURPOSE OF CONSULTATION:  Newly diagnosed leiomyosarcoma  HISTORY OF PRESENTING ILLNESS:  Valerie Kerr 63 y.o. female is here because of recent diagnosis of leiomyosarcoma The patient was seen by myself in 2018 after diagnosis of cervical cancer She was treated with chemoradiation therapy with complete response Over the past 6 to 9 months, she has been having a lot of back pain Subsequently, she had further imaging study and biopsy which revealed diagnosis of leiomyosarcoma She is starting radiation therapy tomorrow She is here accompanied by her 2 daughters.  Her oldest daughter is not present but available over the phone The patient is having 10 out of 10 pain despite being on liquid methadone and low-dose Percocet She denies constipation She is not able to sit and have pain when she stands up She is very debilitated because of her cancer  I have reviewed her chart and materials related to her cancer extensively and collaborated history with the patient. Summary of oncologic history is as follows: Oncology History  Cervical cancer (HCC)  02/20/2016 Initial Diagnosis   She presented to the ED with vaginal bleeding and abnormal pelvic mass. She was seen in the office by Dr Genevie Ann 03/01/16 who performed cervical biopsies of a friable mass  which revealed poorly differentiated squamous cell carcinoma.   03/01/2016 Pathology Results   1. Cervix, biopsy, mass INFILTRATIVE SQUAMOUS CELL CARCINOMA 2. Cervix, biopsy INVASIVE POORLY DIFFERENTIATED SQUAMOUS CELL CARCINOMA   03/03/2016 Imaging   Pelvic US was Suboptimal study due to bowel gas. Difficult visualization of the uterus and adnexal structures appear probable multiple fibroids within the uterus. Endometrium thickened at 14 mm. In the setting of post-menopausal bleeding, endometrial sampling is indicated to exclude carcinoma. If results are benign, sonohysterogram should be considered for focal lesion work-up.    03/23/2016 Miscellaneous   Examination by Dr. Andrey Farmer in the office concluded the cervix is completely replaced  by a 6cm exophytic friable mass which is replacing the cervix and encroaching the upper vaginal fornices and the parametrium bilaterally.   04/04/2016 PET scan   5.5 cm hypermetabolic cervical mass, consistent with known primary cervical carcinoma. No definite local or distant metastatic disease. Sub-cm hypermetabolic left axillary lymph nodes, likely reactive in etiology. Recommend continued attention on follow-up imaging.     04/08/2016 Procedure   Dr. Kristin Bruins performed Multiple extraction of tooth numbers 3, 9, 21, 30, and 31 .  2 Quadrants of alveoloplasty 3. Gross debridement of remaining dentition      04/12/2016 - 05/09/2016 Chemotherapy   The patient had weekly concurrent chemotherapy with radiation    04/12/2016 - 07/04/2016 Radiation Therapy   Radiation treatment dates:  04/12/16-05/31/16                                                 06/20/16-07/04/16   Site/dose:  1) Pelvis/ 45 Gy in 25 fractions                         2) Pelvis Boost/ 9 Gy in 5 fractions                         3) Cervix / 28 Gy in 5 fractions   Beams/energy:  1) 3D / 15X                                     2) Isodose plan/ 15X                                     3) HDR  Ir-192 Cervix / Iridium HDR, tandem/ring applicator   05/16/2016 Adverse Reaction   Week 6 of chemotherapy is placed on hold due to pancytopenia.   06/29/2016 PET scan   Generally markedly improved in appearance, with resolved masslike appearance of the cervix and dramatic reduction in cervical metabolic activity. Moreover, the left axillary nodes shown on the prior exam are no longer hypermetabolic. 2. However, there is a new small hypermetabolic focus in the right inguinal region corresponding to a suspected lymph node along a vascular confluence, maximum SUV 6.4. This merits surveillance. 3. Other imaging findings of potential clinical significance: Mucous retention cyst in the left maxillary sinus. Aortic atherosclerosis. Nonobstructive left nephrolithiasis. Spondylosis and degenerative disc disease in the lower lumbar spine.   Leiomyosarcoma (HCC)  04/19/2023 PET scan   CT Biopsy Result Date: 04/27/2023 INDICATION: 63 year old female referred for biopsy destructive sacral mass. EXAM: CT BIOPSY MEDICATIONS: None. ANESTHESIA/SEDATION: Moderate (conscious) sedation was employed during this procedure. A total of Versed 1.0 mg and Fentanyl 100 mcg was administered intravenously. Moderate Sedation Time: 12 minutes. The patient's level of consciousness and vital signs were monitored continuously by radiology nursing throughout the procedure under my direct supervision. FLUOROSCOPY TIME:  CT COMPLICATIONS: None PROCEDURE: Informed written consent was obtained from the patient after a thorough discussion of the procedural risks, benefits and alternatives. All questions were addressed. Maximal Sterile Barrier Technique was utilized including caps, mask, sterile gowns, sterile gloves, sterile drape, hand hygiene and skin antiseptic. A timeout was performed prior to  the initiation of the procedure. Patient was positioned left decubitus on the CT gantry table. Scout CT of the pelvis acquired for planning purposes.  The patient was then prepped and draped in the usual sterile fashion. 1% lidocaine was used for local anesthesia. Using CT guidance, 17 gauge guide needle was addressed do's from a posterior approach into the right aspect of the sacral mass. Once we confirmed the cannula position, a 20 cm Chiba needle was carefully passed through the cannula to assure that there was no at risk sacral nerve. The patient did not feel any additional pain upon passage of the Chiba needle. Multiple 18 gauge core biopsy were then acquired comfortably. Needle was removed. Patient tolerated the procedure well and remained hemodynamically stable throughout. No complications were encountered and no significant blood loss. IMPRESSION: Status post CT-guided biopsy of sacral mass. Signed, Yvone Neu. Miachel Roux, RPVI [facility] Electronically Signed   By: Gilmer Mor D.O.   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 EXAM: NUCLEAR MEDICINE PET SKULL BASE TO THIGH TECHNIQUE: 8.4 mCi F-18 FDG was injected intravenously. Full-ring PET imaging was performed from the skull base to thigh after the radiotracer. CT data was obtained and used for attenuation correction and anatomic localization. Fasting blood glucose: 149 mg/dl COMPARISON:  PET-CT 95/63/8756 FINDINGS: Mediastinal blood pool activity: SUV max 2.3 Liver activity: SUV max NA NECK: Prominent but symmetric glottic/arytenoid activity, maximum SUV 12.8, likely physiologic. Incidental CT findings: Chronic left maxillary sinusitis. Mild bilateral common carotid atheromatous vascular calcifications. CHEST: Small focus of accentuated activity along the cutaneous or immediate subcutaneous surface of the left lower breast as shown on image 84 series 607, maximum SUV 7.5,  questionable faint cutaneous thickening or subcutaneous adipose tissue stranding in this vicinity, diagnostic mammographic correlation suggested. Incidental CT findings: Mild mitral valve calcification. ABDOMEN/PELVIS: No significant abnormal hypermetabolic activity in this region. Incidental CT findings: Atherosclerosis is present, including aortoiliac atherosclerotic disease. SKELETON: Hypermetabolic sacral mass measuring about 8.0 by 4.8 cm, maximum SUV 13.2. I am skeptical that this degree of bony lysis would be due to a benign fracture although there are superimposed pathologic fractures extending through the sacrum. Abnormal erosions are present along the iliac sides of the sacroiliac joints and could be from arthropathy or less likely tumor or infection. These erosions are not as hypermetabolic as the main sacral finding. Vertical fracture of the right L5 transverse process extending partially into the vertebral body on image 130 series 4, with hypermetabolic activity up to maximum SUV 8.3, more likely to be benign given the linear configuration of the fracture. Chronic fracture of the right pubis without hypermetabolic activity, but with evidence of bony nonunion. Small focus of activity along the right anterior ninth intercostal space just anterior to the right hepatic lobe, maximum SUV 4.3, probably physiologic muscular activity, less likely mid misregistration a rib lesion. Incidental CT findings: None. IMPRESSION: 1. Hypermetabolic sacral mass measuring about 8.0 by 4.8 cm, maximum SUV 13.2. Appearance favors active malignancy given the degree of soft tissue density and infiltration. 2. Abnormal erosions along the iliac sides of the sacroiliac joints, not as hypermetabolic as the main sacral finding. These erosions could be from arthropathy or less likely tumor or infection. 3. Vertical fracture of the right L5 transverse process extending partially into the vertebral body, with  hypermetabolic activity  up to maximum SUV 8.3, more likely to be benign given the linear configuration of the fracture. 4. Chronic fracture of the right pubis without hypermetabolic activity, but with evidence of bony nonunion. 5. Small focus of accentuated activity along the cutaneous or immediate subcutaneous surface of the left lower breast, maximum SUV 7.5, questionable faint cutaneous thickening or subcutaneous adipose tissue stranding in this vicinity, diagnostic mammographic correlation suggested. 6. Small focus of activity along the right anterior ninth intercostal space just anterior to the right hepatic lobe, maximum SUV 4.3, probably physiologic muscular activity, less likely mid misregistration an otherwise occult rib lesion. 7. Prominent but symmetric glottic/arytenoid activity, maximum SUV 12.8, likely physiologic. 8. Chronic left maxillary sinusitis. 9. Aortic atherosclerosis. Electronically Signed   By: Gaylyn Rong M.D.   On: 04/27/2023 10:40      04/27/2023 Pathology Results   SURGICAL PATHOLOGY  CASE: [id number]  PATIENT: Valerie Kerr  Surgical Pathology Report   Clinical History: suspect mets (cm)   FINAL MICROSCOPIC DIAGNOSIS:   A. 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  See comment.   COMMENT:  The core biopsies show a spindle cell neoplasm characterized by fascicles of elongated cells with nuclear pleomorphism scattered mitotic figures.  There is a microscopic focus suspicious for lymphovascular involvement.  Immunohistochemistry shows focal, patchy positivity with smooth muscle actin and negative staining with desmin cytokeratin AE1/AE3, cytokeratin 5/6, cytokeratin 903 and S100.  The morphology and focal smooth muscle actin positivity favors high-grade leiomyosarcoma    05/16/2023 Initial Diagnosis   Leiomyosarcoma (HCC)   05/16/2023 Cancer Staging   Staging form: Soft Tissue Sarcoma, AJCC 7th Edition - Clinical stage from  05/16/2023: Stage IV (T2, N0, M1) - Signed by [name], MD on 05/16/2023 Stage prefix: Initial diagnosis Biopsy of metastatic site performed: Yes Source of metastatic specimen: Bone   05/30/2023 -  Chemotherapy   Patient is on Treatment Plan : UTERINE LEIOMYOSARCOMA Doxorubicin (75) q21d x 6 Cycles       MEDICAL HISTORY:  Past Medical History:  Diagnosis Date   Anxiety    Cervical cancer [facility]) oncologist-  dr Dasia Guerrier/ dr Roselind Messier   02-20-2016 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---  treatment concurrent chemo (week 6 on hold due to pancytopenia) and radiation therapy (external beam 04-12-2016 to 05-31-2016)  started high-dose rate brachytherapy 05-26-2016   Chemotherapy-induced thrombocytopenia    Chronic pain disorder    back,neck-- chronic methadone   Environmental allergies    allergy to dust mites   GERD (gastroesophageal reflux disease)    Headache    Herniated disc, cervical    History of external beam radiation therapy 04/12/16-05/31/16   pelvis 45 Gy in 25 fractions, in  30 sessions, pelvis boost 9 Gy in 5 fractions   History of radiation therapy 06/20/16-07/04/16   tandem and ring applicator to cervix 28 Gy in 5 fractions   Hypertension    Hypokalemia    Hypomagnesemia    po supplement and IV replacement   Leukopenia due to antineoplastic chemotherapy (HCC)    Lumbar herniated disc    Pancytopenia due to chemotherapy (HCC)    Periodontitis     SURGICAL HISTORY: Past Surgical History:  Procedure Laterality Date   ABCESS DRAINAGE Left 1982   breast   CARPAL TUNNEL RELEASE Bilateral    CERVICAL DISC SURGERY  2010   MULTIPLE EXTRACTIONS WITH ALVEOLOPLASTY N/A 04/08/2016   Procedure: Extraction of tooth #'s 1,6,10,96,04  with alveoloplasty  and gross debridement of remaining teeth;  Surgeon: Charlynne Pander, DDS;  Location: [facility] OR;  Service: Oral Surgery;  Laterality: N/A;   TANDEM RING INSERTION N/A 05/26/2016   Procedure: TANDEM RING INSERTION;   Surgeon: Antony Blackbird, MD;  Location: [facility];  Service: Urology;  Laterality: N/A;   TANDEM RING INSERTION N/A 06/02/2016   Procedure: TANDEM RING INSERTION;  Surgeon: Antony Blackbird, MD;  Location: [facility];  Service: Urology;  Laterality: N/A;   TANDEM RING INSERTION N/A 06/20/2016   Procedure: TANDEM RING INSERTION;  Surgeon: Antony Blackbird, MD;  Location: [facility];  Service: Urology;  Laterality: N/A;   TANDEM RING INSERTION N/A 06/23/2016   Procedure: TANDEM RING INSERTION;  Surgeon: Antony Blackbird, MD;  Location: [facility];  Service: Urology;  Laterality: N/A;   TANDEM RING INSERTION N/A 07/04/2016   Procedure: TANDEM RING INSERTION;  Surgeon: Antony Blackbird, MD;  Location: [facility];  Service: Urology;  Laterality: N/A;    SOCIAL HISTORY: Social History   Socioeconomic History   Marital status: Divorced    Spouse name: Not on file   Number of children: 5   Years of education: Not on file   Highest education level: Not on file  Occupational History   Occupation: disabled  Tobacco Use   Smoking status: Every Day    Current packs/day: 0.20    Average packs/day: 0.2 packs/day for 45.0 years (9.0 ttl pk-yrs)    Types: Cigarettes   Smokeless tobacco: Never   Tobacco comments:    1 week since last cigarette  Vaping Use   Vaping status: Never Used  Substance and Sexual Activity   Alcohol use: No   Drug use: No   Sexual activity: Not Currently  Other Topics Concern   Not on file  Social History Narrative   Lives with niece in a 2 story home.  Has 5 children.  On disability.  Formerly worked in Stage manager at [employer].  Education: 11th grade.    Social Drivers of Corporate investment banker Strain: Not on file  Food Insecurity: Food Insecurity Present (04/14/2023)   Hunger Vital Sign    Worried About Running Out of Food in the Last Year: Sometimes true    Ran Out of Food in the Last Year:  Sometimes true  Transportation Needs: No Transportation Needs (04/19/2023)   Received from [facility]   OASIS A1250: Transportation    Lack of Transportation (Medical): No    Lack of Transportation (Non-Medical): No    Patient Unable or Declines to Respond: No  Physical Activity: Not on file  Stress: Not on file  Social Connections: Feeling Socially Integrated (04/19/2023)   Received from [facility]   OASIS D0700: Social Isolation    Frequency of experiencing loneliness or isolation: Never  Intimate Partner Violence: Not At Risk (04/13/2023)   Humiliation, Afraid, Rape, and Kick questionnaire    Fear of Current or Ex-Partner: No    Emotionally Abused: No    Physically Abused: No    Sexually Abused: No    FAMILY HISTORY: Family History  Problem Relation Age of Onset   Cancer Father    Cancer Sister        throat   Cancer Brother        throat and stomach   Cancer Brother        lung    ALLERGIES:  is allergic  to codeine, fentanyl, hydrocodone-acetaminophen, and tramadol.  MEDICATIONS:  Current Outpatient Medications  Medication Sig Dispense Refill   methadone (DOLOPHINE) 10 MG tablet Take 4 tablets (40 mg total) by mouth every 12 (twelve) hours. 90 tablet 0   oxyCODONE (ROXICODONE) 15 MG immediate release tablet Take 1 tablet (15 mg total) by mouth every 4 (four) hours as needed for severe pain (pain score 7-10). 90 tablet 0   ALPRAZolam (XANAX) 0.25 MG tablet Take 0.5 tablets (0.125 mg total) by mouth 2 (two) times daily as needed for anxiety. 30 tablet 0   cetirizine (ZYRTEC) 10 MG tablet TAKE 1 TABLET BY MOUTH EVERY DAY 90 tablet 0   cyclobenzaprine (FLEXERIL) 10 MG tablet Take by mouth.     dexamethasone (DECADRON) 4 MG tablet Take 1 tablet (4 mg total) by mouth 2 (two) times daily with a meal. 60 tablet 0   fluticasone (FLONASE) 50 MCG/ACT nasal spray INSTILL 1 SPRAY INTO EACH NOSTRIL DAILY 48 mL 0   levocetirizine (XYZAL) 5 MG tablet Take 5 mg  by mouth at bedtime. (Patient not taking: Reported on 04/13/2023)     lidocaine (LIDODERM) 5 % Place 1 patch onto the skin daily. Remove & Discard patch within 12 hours or as directed by MD 30 patch 0   lisinopril-hydrochlorothiazide (ZESTORETIC) 20-25 MG tablet Take 1 tablet by mouth daily. 30 tablet 0   omeprazole (PRILOSEC) 20 MG capsule Take 1 capsule (20 mg total) by mouth daily. Must have office visit for refills 90 capsule 0   prochlorperazine (COMPAZINE) 10 MG tablet Take 1 tablet (10 mg total) by mouth every 6 (six) hours as needed for nausea. 30 tablet 0   Vitamin D, Ergocalciferol, (DRISDOL) 50000 units CAPS capsule Take 1 capsule (50,000 Units total) by mouth every 7 (seven) days. (Patient taking differently: Take 50,000 Units by mouth every Saturday.) 16 capsule 0   VOLTAREN 1 % GEL Apply 4 g topically 4 (four) times daily. 100 g 0   No current facility-administered medications for this visit.    REVIEW OF SYSTEMS:   Constitutional: Denies fevers, chills or abnormal night sweats Eyes: Denies blurriness of vision, double vision or watery eyes Ears, nose, mouth, throat, and face: Denies mucositis or sore throat Respiratory: Denies cough, dyspnea or wheezes Cardiovascular: Denies palpitation, chest discomfort or lower extremity swelling Gastrointestinal:  Denies nausea, heartburn or change in bowel habits Skin: Denies abnormal skin rashes Lymphatics: Denies new lymphadenopathy or easy bruising Neurological:Denies numbness, tingling or new weaknesses Behavioral/Psych: Mood is stable, no new changes  All other systems were reviewed with the patient and are negative.  PHYSICAL EXAMINATION: ECOG PERFORMANCE STATUS: 2 - Symptomatic, <50% confined to bed  Vitals:   05/16/23 1423  BP: 131/71  Pulse: (!) 107  Resp: 18  Temp: 98.2 F (36.8 C)  SpO2: 98%   Filed Weights   05/16/23 1423  Weight: 162 lb 9.6 oz (73.8 kg)    GENERAL:alert, in moderate distress from pain.  Unable  to examine her due to positional pain NEURO: no focal motor/sensory deficits  LABORATORY DATA:  I have reviewed the data as listed Lab Results  Component Value Date   WBC 7.9 02/10/2020   HGB 12.3 02/10/2020   HCT 39.2 02/10/2020   MCV 89.7 02/10/2020   PLT 265 02/10/2020   No results for input(s): "NA", "K", "CL", "CO2", "GLUCOSE", "BUN", "CREATININE", "CALCIUM", "GFRNONAA", "GFRAA", "PROT", "ALBUMIN", "AST", "ALT", "ALKPHOS", "BILITOT", "BILIDIR", "IBILI" in the last 8760 hours.  RADIOGRAPHIC STUDIES: I  have reviewed imaging study with the patient and family I have personally reviewed the radiological images as listed and agreed with the findings in the report. CT Biopsy Result Date: 04/27/2023 INDICATION: 63 year old female referred for biopsy destructive sacral mass. EXAM: CT BIOPSY MEDICATIONS: None. ANESTHESIA/SEDATION: Moderate (conscious) sedation was employed during this procedure. A total of Versed 1.0 mg and Fentanyl 100 mcg was administered intravenously. Moderate Sedation Time: 12 minutes. The patient's level of consciousness and vital signs were monitored continuously by radiology nursing throughout the procedure under my direct supervision. FLUOROSCOPY TIME:  CT COMPLICATIONS: None PROCEDURE: Informed written consent was obtained from the patient after a thorough discussion of the procedural risks, benefits and alternatives. All questions were addressed. Maximal Sterile Barrier Technique was utilized including caps, mask, sterile gowns, sterile gloves, sterile drape, hand hygiene and skin antiseptic. A timeout was performed prior to the initiation of the procedure. Patient was positioned left decubitus on the CT gantry table. Scout CT of the pelvis acquired for planning purposes. The patient was then prepped and draped in the usual sterile fashion. 1% lidocaine was used for local anesthesia. Using CT guidance, 17 gauge guide needle was addressed do's from a posterior approach into  the right aspect of the sacral mass. Once we confirmed the cannula position, a 20 cm Chiba needle was carefully passed through the cannula to assure that there was no at risk sacral nerve. The patient did not feel any additional pain upon passage of the Chiba needle. Multiple 18 gauge core biopsy were then acquired comfortably. Needle was removed. Patient tolerated the procedure well and remained hemodynamically stable throughout. No complications were encountered and no significant blood loss. IMPRESSION: Status post CT-guided biopsy of sacral mass. Signed, Yvone Neu. Miachel Roux, RPVI [facility] Radiology Electronically Signed   By: Gilmer Mor D.O.   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 EXAM: NUCLEAR MEDICINE PET SKULL BASE TO THIGH TECHNIQUE: 8.4 mCi F-18 FDG was injected intravenously. Full-ring PET imaging was performed from the skull base to thigh after the radiotracer. CT data was obtained and used for attenuation correction and anatomic localization. Fasting blood glucose: 149 mg/dl COMPARISON:  PET-CT 30/86/5784 FINDINGS: Mediastinal blood pool activity: SUV max 2.3 Liver activity: SUV max NA NECK: Prominent but symmetric glottic/arytenoid activity, maximum SUV 12.8, likely physiologic. Incidental CT findings: Chronic left maxillary sinusitis. Mild bilateral common carotid atheromatous vascular calcifications. CHEST: Small focus of accentuated activity along the cutaneous or immediate subcutaneous surface of the left lower breast as shown on image 84 series 607, maximum SUV 7.5, questionable faint cutaneous thickening or subcutaneous adipose tissue stranding in this vicinity, diagnostic mammographic correlation suggested. Incidental CT findings: Mild mitral valve calcification.  ABDOMEN/PELVIS: No significant abnormal hypermetabolic activity in this region. Incidental CT findings: Atherosclerosis is present, including aortoiliac atherosclerotic disease. SKELETON: Hypermetabolic sacral mass measuring about 8.0 by 4.8 cm, maximum SUV 13.2. I am skeptical that this degree of bony lysis would be due to a benign fracture although there are superimposed pathologic fractures extending through the sacrum. Abnormal erosions are present along the iliac sides of the sacroiliac joints and could be from arthropathy or less likely tumor or infection. These erosions are not as hypermetabolic as the main sacral finding. Vertical fracture of the right L5 transverse process extending partially into the vertebral body on  image 130 series 4, with hypermetabolic activity up to maximum SUV 8.3, more likely to be benign given the linear configuration of the fracture. Chronic fracture of the right pubis without hypermetabolic activity, but with evidence of bony nonunion. Small focus of activity along the right anterior ninth intercostal space just anterior to the right hepatic lobe, maximum SUV 4.3, probably physiologic muscular activity, less likely mid misregistration a rib lesion. Incidental CT findings: None. IMPRESSION: 1. Hypermetabolic sacral mass measuring about 8.0 by 4.8 cm, maximum SUV 13.2. Appearance favors active malignancy given the degree of soft tissue density and infiltration. 2. Abnormal erosions along the iliac sides of the sacroiliac joints, not as hypermetabolic as the main sacral finding. These erosions could be from arthropathy or less likely tumor or infection. 3. Vertical fracture of the right L5 transverse process extending partially into the vertebral body, with hypermetabolic activity up to maximum SUV 8.3, more likely to be benign given the linear configuration of the fracture. 4. Chronic fracture of the right pubis without hypermetabolic activity, but with evidence of bony nonunion.  5. Small focus of accentuated activity along the cutaneous or immediate subcutaneous surface of the left lower breast, maximum SUV 7.5, questionable faint cutaneous thickening or subcutaneous adipose tissue stranding in this vicinity, diagnostic mammographic correlation suggested. 6. Small focus of activity along the right anterior ninth intercostal space just anterior to the right hepatic lobe, maximum SUV 4.3, probably physiologic muscular activity, less likely mid misregistration an otherwise occult rib lesion. 7. Prominent but symmetric glottic/arytenoid activity, maximum SUV 12.8, likely physiologic. 8. Chronic left maxillary sinusitis. 9. Aortic atherosclerosis. Electronically Signed   By: Gaylyn Rong M.D.   On: 04/27/2023 10:40

## 2023-05-17 ENCOUNTER — Other Ambulatory Visit: Payer: Self-pay

## 2023-05-17 ENCOUNTER — Ambulatory Visit: Payer: Medicare Other | Admitting: Radiation Oncology

## 2023-05-17 ENCOUNTER — Telehealth: Payer: Self-pay | Admitting: Nurse Practitioner

## 2023-05-17 ENCOUNTER — Other Ambulatory Visit: Payer: Self-pay | Admitting: Nurse Practitioner

## 2023-05-17 NOTE — Telephone Encounter (Signed)
 Called and left VM for patient to call back to schedule an appointment.

## 2023-05-18 ENCOUNTER — Other Ambulatory Visit: Payer: Self-pay

## 2023-05-18 ENCOUNTER — Ambulatory Visit
Admission: RE | Admit: 2023-05-18 | Discharge: 2023-05-18 | Discharge status: Home / Self Care | Payer: Medicare Other | Source: Ambulatory Visit | Attending: Radiation Oncology | Admitting: Radiation Oncology

## 2023-05-18 DIAGNOSIS — C499 Malignant neoplasm of connective and soft tissue, unspecified: Secondary | ICD-10-CM

## 2023-05-18 DIAGNOSIS — Z510 Encounter for antineoplastic radiation therapy: Secondary | ICD-10-CM | POA: Diagnosis not present

## 2023-05-18 LAB — RAD ONC ARIA SESSION SUMMARY
Course Elapsed Days: 0
Plan Fractions Treated to Date: 1
Plan Prescribed Dose Per Fraction: 2.5 Gy
Plan Total Fractions Prescribed: 15
Plan Total Prescribed Dose: 37.5 Gy
Reference Point Dosage Given to Date: 2.5 Gy
Reference Point Session Dosage Given: 2.5 Gy
Session Number: 1

## 2023-05-19 ENCOUNTER — Ambulatory Visit: Payer: Medicare Other

## 2023-05-19 ENCOUNTER — Other Ambulatory Visit: Payer: Self-pay

## 2023-05-22 ENCOUNTER — Other Ambulatory Visit: Payer: Self-pay

## 2023-05-22 ENCOUNTER — Other Ambulatory Visit: Payer: Self-pay | Admitting: Radiology

## 2023-05-22 ENCOUNTER — Ambulatory Visit
Admission: RE | Admit: 2023-05-22 | Discharge: 2023-05-22 | Disposition: A | Discharge status: Home / Self Care | Payer: Medicare Other | Source: Ambulatory Visit | Attending: Radiation Oncology | Admitting: Radiation Oncology

## 2023-05-22 DIAGNOSIS — Z510 Encounter for antineoplastic radiation therapy: Secondary | ICD-10-CM | POA: Diagnosis not present

## 2023-05-22 LAB — RAD ONC ARIA SESSION SUMMARY
Course Elapsed Days: 4
Plan Fractions Treated to Date: 2
Plan Prescribed Dose Per Fraction: 2.5 Gy
Plan Total Fractions Prescribed: 15
Plan Total Prescribed Dose: 37.5 Gy
Reference Point Dosage Given to Date: 5 Gy
Reference Point Session Dosage Given: 2.5 Gy
Session Number: 2

## 2023-05-23 ENCOUNTER — Ambulatory Visit
Admission: RE | Admit: 2023-05-23 | Discharge: 2023-05-23 | Disposition: A | Discharge status: Home / Self Care | Payer: Medicare Other | Source: Ambulatory Visit | Attending: Radiation Oncology | Admitting: Radiation Oncology

## 2023-05-23 ENCOUNTER — Other Ambulatory Visit: Payer: Self-pay

## 2023-05-23 ENCOUNTER — Other Ambulatory Visit: Payer: Self-pay | Admitting: Radiation Oncology

## 2023-05-23 ENCOUNTER — Other Ambulatory Visit (HOSPITAL_COMMUNITY): Payer: Self-pay

## 2023-05-23 DIAGNOSIS — Z510 Encounter for antineoplastic radiation therapy: Secondary | ICD-10-CM | POA: Diagnosis not present

## 2023-05-23 LAB — RAD ONC ARIA SESSION SUMMARY
Course Elapsed Days: 5
Plan Fractions Treated to Date: 3
Plan Prescribed Dose Per Fraction: 2.5 Gy
Plan Total Fractions Prescribed: 15
Plan Total Prescribed Dose: 37.5 Gy
Reference Point Dosage Given to Date: 7.5 Gy
Reference Point Session Dosage Given: 2.5 Gy
Session Number: 3

## 2023-05-23 MED ORDER — OXYCODONE HCL 5 MG PO TABS
5.0000 mg | ORAL_TABLET | ORAL | 0 refills | Status: DC | PRN
  Filled 2023-05-23: qty 60, 10d supply, fill #0

## 2023-05-24 ENCOUNTER — Telehealth: Payer: Self-pay

## 2023-05-24 ENCOUNTER — Encounter: Payer: Self-pay | Admitting: Hematology and Oncology

## 2023-05-24 ENCOUNTER — Ambulatory Visit: Payer: Medicare Other

## 2023-05-24 NOTE — Telephone Encounter (Signed)
 Patient called in to report no relief with oxycodone 5mg  along with  oxycodone 15mg . Patient states her pain is so severe that its making it hard for her to move around. Patient asking if she could try something stronger for pain. Please advise

## 2023-05-25 ENCOUNTER — Ambulatory Visit: Payer: Self-pay | Admitting: Radiation Oncology

## 2023-05-25 ENCOUNTER — Ambulatory Visit (HOSPITAL_BASED_OUTPATIENT_CLINIC_OR_DEPARTMENT_OTHER)
Admission: RE | Admit: 2023-05-25 | Discharge: 2023-05-25 | Disposition: A | Discharge status: Home / Self Care | Payer: Medicare Other | Source: Ambulatory Visit | Attending: Hematology and Oncology | Admitting: Hematology and Oncology

## 2023-05-25 ENCOUNTER — Inpatient Hospital Stay (HOSPITAL_BASED_OUTPATIENT_CLINIC_OR_DEPARTMENT_OTHER): Payer: Medicare Other | Admitting: Hematology and Oncology

## 2023-05-25 ENCOUNTER — Other Ambulatory Visit (HOSPITAL_COMMUNITY): Payer: Self-pay

## 2023-05-25 ENCOUNTER — Other Ambulatory Visit: Payer: Self-pay

## 2023-05-25 ENCOUNTER — Ambulatory Visit
Admission: RE | Admit: 2023-05-25 | Discharge: 2023-05-25 | Disposition: A | Discharge status: Home / Self Care | Payer: Medicare Other | Source: Ambulatory Visit | Attending: Radiation Oncology | Admitting: Radiation Oncology

## 2023-05-25 VITALS — BP 106/60 | HR 95 | Temp 98.1°F | Resp 18 | Ht 61.5 in | Wt 161.6 lb

## 2023-05-25 DIAGNOSIS — Z0189 Encounter for other specified special examinations: Secondary | ICD-10-CM

## 2023-05-25 DIAGNOSIS — R5381 Other malaise: Secondary | ICD-10-CM | POA: Diagnosis not present

## 2023-05-25 DIAGNOSIS — C414 Malignant neoplasm of pelvic bones, sacrum and coccyx: Secondary | ICD-10-CM | POA: Diagnosis not present

## 2023-05-25 DIAGNOSIS — I10 Essential (primary) hypertension: Secondary | ICD-10-CM | POA: Insufficient documentation

## 2023-05-25 DIAGNOSIS — Z510 Encounter for antineoplastic radiation therapy: Secondary | ICD-10-CM | POA: Diagnosis not present

## 2023-05-25 DIAGNOSIS — G893 Neoplasm related pain (acute) (chronic): Secondary | ICD-10-CM

## 2023-05-25 DIAGNOSIS — C499 Malignant neoplasm of connective and soft tissue, unspecified: Secondary | ICD-10-CM

## 2023-05-25 DIAGNOSIS — Z91199 Patient's noncompliance with other medical treatment and regimen due to unspecified reason: Secondary | ICD-10-CM

## 2023-05-25 LAB — ECHOCARDIOGRAM COMPLETE
AR max vel: 2.12 cm2
AV Area VTI: 2.27 cm2
AV Area mean vel: 1.79 cm2
AV Mean grad: 3 mm[Hg]
AV Peak grad: 4.8 mm[Hg]
Ao pk vel: 1.1 m/s
Area-P 1/2: 4.08 cm2
Calc EF: 57 %
Height: 61.5 in
MV VTI: 2.1 cm2
S' Lateral: 2.9 cm
Single Plane A2C EF: 54.2 %
Single Plane A4C EF: 59.3 %
Weight: 2585.6 [oz_av]

## 2023-05-25 LAB — RAD ONC ARIA SESSION SUMMARY
Course Elapsed Days: 7
Plan Fractions Treated to Date: 4
Plan Prescribed Dose Per Fraction: 2.5 Gy
Plan Total Fractions Prescribed: 15
Plan Total Prescribed Dose: 37.5 Gy
Reference Point Dosage Given to Date: 10 Gy
Reference Point Session Dosage Given: 2.5 Gy
Session Number: 4

## 2023-05-25 NOTE — Progress Notes (Signed)
  Echocardiogram 2D Echocardiogram has been performed.  Ocie Doyne RDCS 05/25/2023, 3:50 PM

## 2023-05-26 ENCOUNTER — Ambulatory Visit
Admission: RE | Admit: 2023-05-26 | Discharge: 2023-05-26 | Disposition: A | Discharge status: Home / Self Care | Payer: Medicare Other | Source: Ambulatory Visit | Attending: Radiation Oncology | Admitting: Radiation Oncology

## 2023-05-26 ENCOUNTER — Other Ambulatory Visit: Payer: Self-pay

## 2023-05-26 ENCOUNTER — Encounter: Payer: Self-pay | Admitting: Hematology and Oncology

## 2023-05-26 DIAGNOSIS — Z91199 Patient's noncompliance with other medical treatment and regimen due to unspecified reason: Secondary | ICD-10-CM | POA: Insufficient documentation

## 2023-05-26 DIAGNOSIS — Z510 Encounter for antineoplastic radiation therapy: Secondary | ICD-10-CM | POA: Diagnosis not present

## 2023-05-26 LAB — RAD ONC ARIA SESSION SUMMARY
Course Elapsed Days: 8
Plan Fractions Treated to Date: 5
Plan Prescribed Dose Per Fraction: 2.5 Gy
Plan Total Fractions Prescribed: 15
Plan Total Prescribed Dose: 37.5 Gy
Reference Point Dosage Given to Date: 12.5 Gy
Reference Point Session Dosage Given: 2.5 Gy
Session Number: 5

## 2023-05-26 NOTE — Assessment & Plan Note (Addendum)
 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

## 2023-05-26 NOTE — Assessment & Plan Note (Addendum)
 We spent some time discussing importance of patient compliance and medication refill policy

## 2023-05-26 NOTE — Assessment & Plan Note (Addendum)
 She has poorly controlled pain and medical noncompliance We discussed importance of not getting medication refilled through multiple providers She is instructed to start taking methadone as prescribed by myself along with oxycodone as needed I explained to the patient why her high-dose oxycodone cannot be refilled as the patient received refill from another physician recently even at a lower dose this constitute patient noncompliance

## 2023-05-26 NOTE — Assessment & Plan Note (Addendum)
 She has significant debility and difficulties with transportation We have consulted social worker and palliative care to address some of her needs

## 2023-05-26 NOTE — Progress Notes (Signed)
 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 She is started on palliative radiation therapy Pretreatment echocardiogram was normal I did not get a chance to go through side effects of doxorubicin with the patient due to the patient being late and we have difficulties managing her pain I will reassess pain control next week and we will discuss side effects of doxorubicin again next week We will finish radiation first before starting her on palliative radiation treatment Cancer associated pain She has poorly controlled pain and medical noncompliance We discussed importance of not getting medication refilled through multiple providers She is instructed to start taking methadone as prescribed by myself along with oxycodone as needed I explained to the patient why her high-dose oxycodone cannot be refilled as the patient received refill from another physician recently even at a lower dose this constitute patient noncompliance Medical non-compliance We spent some time discussing importance of patient compliance and medication refill policy Physical debility She has significant debility and difficulties with transportation We have consulted social worker and palliative care to address some of her needs  No orders of the defined types were placed in this encounter.    [name], MD  INTERVAL HISTORY: she returns for treatment follow-up The patient was late to her appointment The patient have difficulties sitting down due to uncontrolled pain She did not pick up prescription methadone that I have prescribed She started taking oxycodone 15 mg and then had additional refill from radiation oncologist and she has been combining the 15 mg I prescribed with the 5 mg that was prescribed by the radiation  oncologist Her pain remained poorly controlled The patient did not understand the role of long-acting pain medicine and short acting pain medicine and we spent some time addressing this We discussed the role of echocardiogram monitoring Due to the patient running late and I am out of time, we only spent majority of our discussion and pain management and will reschedule her appointment to next week to discuss the role of chemotherapy  PHYSICAL EXAMINATION: ECOG PERFORMANCE STATUS: 2 - Symptomatic, <50% confined to bed  Vitals:   05/25/23 1421  BP: 106/60  Pulse: 95  Resp: 18  Temp: 98.1 F (36.7 C)  SpO2: 98%   Filed Weights   05/25/23 1421  Weight: 161 lb 9.6 oz (73.3 kg)    Relevant data reviewed during this visit included echocardiogram results

## 2023-05-29 ENCOUNTER — Other Ambulatory Visit: Payer: Self-pay

## 2023-05-29 ENCOUNTER — Ambulatory Visit
Admission: RE | Admit: 2023-05-29 | Discharge: 2023-05-29 | Disposition: A | Discharge status: Home / Self Care | Payer: Medicare Other | Source: Ambulatory Visit | Attending: Radiation Oncology | Admitting: Radiation Oncology

## 2023-05-29 DIAGNOSIS — Z510 Encounter for antineoplastic radiation therapy: Secondary | ICD-10-CM | POA: Diagnosis present

## 2023-05-29 DIAGNOSIS — G893 Neoplasm related pain (acute) (chronic): Secondary | ICD-10-CM | POA: Diagnosis not present

## 2023-05-29 DIAGNOSIS — C530 Malignant neoplasm of endocervix: Secondary | ICD-10-CM | POA: Insufficient documentation

## 2023-05-29 DIAGNOSIS — C7951 Secondary malignant neoplasm of bone: Secondary | ICD-10-CM | POA: Diagnosis not present

## 2023-05-29 DIAGNOSIS — R300 Dysuria: Secondary | ICD-10-CM | POA: Insufficient documentation

## 2023-05-29 LAB — RAD ONC ARIA SESSION SUMMARY
Course Elapsed Days: 11
Plan Fractions Treated to Date: 6
Plan Prescribed Dose Per Fraction: 2.5 Gy
Plan Total Fractions Prescribed: 15
Plan Total Prescribed Dose: 37.5 Gy
Reference Point Dosage Given to Date: 15 Gy
Reference Point Session Dosage Given: 2.5 Gy
Session Number: 6

## 2023-05-30 ENCOUNTER — Ambulatory Visit
Admission: RE | Admit: 2023-05-30 | Discharge: 2023-05-30 | Disposition: A | Discharge status: Home / Self Care | Source: Ambulatory Visit | Attending: Radiation Oncology | Admitting: Radiation Oncology

## 2023-05-30 ENCOUNTER — Ambulatory Visit: Payer: Medicare Other

## 2023-05-30 ENCOUNTER — Telehealth: Payer: Self-pay | Admitting: Radiation Oncology

## 2023-05-30 NOTE — Telephone Encounter (Signed)
 Pt called to cx today's tx appt since she is feeling under the weather. Pt asked to me to make sure to let Dr. Trina Ao team know this. Support RTT notified via telephone to r/s appt; RN notified through inbasket.

## 2023-05-31 ENCOUNTER — Ambulatory Visit
Admission: RE | Admit: 2023-05-31 | Discharge: 2023-05-31 | Disposition: A | Discharge status: Home / Self Care | Payer: Medicare Other | Source: Ambulatory Visit | Attending: Radiation Oncology | Admitting: Radiation Oncology

## 2023-05-31 ENCOUNTER — Ambulatory Visit
Admission: RE | Admit: 2023-05-31 | Discharge: 2023-05-31 | Disposition: A | Discharge status: Home / Self Care | Source: Ambulatory Visit | Attending: Radiation Oncology | Admitting: Radiation Oncology

## 2023-05-31 ENCOUNTER — Encounter: Payer: Self-pay | Admitting: Hematology and Oncology

## 2023-05-31 ENCOUNTER — Other Ambulatory Visit: Payer: Self-pay | Admitting: Radiation Oncology

## 2023-05-31 ENCOUNTER — Other Ambulatory Visit (HOSPITAL_COMMUNITY): Payer: Self-pay

## 2023-05-31 ENCOUNTER — Inpatient Hospital Stay: Payer: Medicare Other | Attending: Hematology and Oncology | Admitting: Hematology and Oncology

## 2023-05-31 ENCOUNTER — Other Ambulatory Visit: Payer: Self-pay

## 2023-05-31 VITALS — BP 136/83 | HR 97 | Temp 97.8°F | Resp 18 | Ht 61.5 in | Wt 159.7 lb

## 2023-05-31 DIAGNOSIS — Z91199 Patient's noncompliance with other medical treatment and regimen due to unspecified reason: Secondary | ICD-10-CM

## 2023-05-31 DIAGNOSIS — C7951 Secondary malignant neoplasm of bone: Secondary | ICD-10-CM | POA: Insufficient documentation

## 2023-05-31 DIAGNOSIS — Z91148 Patient's other noncompliance with medication regimen for other reason: Secondary | ICD-10-CM | POA: Diagnosis not present

## 2023-05-31 DIAGNOSIS — C499 Malignant neoplasm of connective and soft tissue, unspecified: Secondary | ICD-10-CM | POA: Diagnosis not present

## 2023-05-31 DIAGNOSIS — F418 Other specified anxiety disorders: Secondary | ICD-10-CM

## 2023-05-31 DIAGNOSIS — G893 Neoplasm related pain (acute) (chronic): Secondary | ICD-10-CM | POA: Insufficient documentation

## 2023-05-31 DIAGNOSIS — Z510 Encounter for antineoplastic radiation therapy: Secondary | ICD-10-CM | POA: Diagnosis not present

## 2023-05-31 DIAGNOSIS — Z7189 Other specified counseling: Secondary | ICD-10-CM | POA: Diagnosis not present

## 2023-05-31 LAB — RAD ONC ARIA SESSION SUMMARY
Course Elapsed Days: 13
Plan Fractions Treated to Date: 7
Plan Prescribed Dose Per Fraction: 2.5 Gy
Plan Total Fractions Prescribed: 15
Plan Total Prescribed Dose: 37.5 Gy
Reference Point Dosage Given to Date: 17.5 Gy
Reference Point Session Dosage Given: 2.5 Gy
Session Number: 7

## 2023-05-31 MED ORDER — OXYCODONE HCL 30 MG PO TABS
30.0000 mg | ORAL_TABLET | ORAL | 0 refills | Status: DC | PRN
  Filled 2023-05-31: qty 90, 23d supply, fill #0

## 2023-05-31 MED ORDER — ALPRAZOLAM 0.25 MG PO TABS
0.1250 mg | ORAL_TABLET | Freq: Two times a day (BID) | ORAL | 0 refills | Status: DC | PRN
  Filled 2023-05-31: qty 30, 30d supply, fill #0

## 2023-05-31 MED ORDER — ONDANSETRON HCL 8 MG PO TABS
8.0000 mg | ORAL_TABLET | Freq: Three times a day (TID) | ORAL | 1 refills | Status: DC | PRN
  Filled 2023-05-31: qty 30, 10d supply, fill #0

## 2023-05-31 MED ORDER — PROCHLORPERAZINE MALEATE 10 MG PO TABS
10.0000 mg | ORAL_TABLET | Freq: Four times a day (QID) | ORAL | 1 refills | Status: DC | PRN
Start: 2023-05-31 — End: 2023-06-29
  Filled 2023-05-31: qty 30, 8d supply, fill #0

## 2023-05-31 MED ORDER — LIDOCAINE-PRILOCAINE 2.5-2.5 % EX CREA
TOPICAL_CREAM | CUTANEOUS | 3 refills | Status: DC
Start: 2023-05-31 — End: 2023-06-19
  Filled 2023-05-31: qty 30, 30d supply, fill #0

## 2023-05-31 NOTE — Assessment & Plan Note (Addendum)
 Counseled and coordinated advanced care planning today.  I tried to discuss the role of chemotherapy with the patient but the patient is not interested to discuss this Her daughter, Valerie Kerr is interested to know more about chemotherapy I explained to her why surgery is not an option We discussed poor prognosis associated with this disease and with the patient's general decline in functional status, it is not clear to me she would be healthy enough to receive chemotherapy in the future

## 2023-05-31 NOTE — Progress Notes (Signed)
  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 She is currently undergoing palliative radiation therapy  She is doing poorly due to poor understanding of medications, not taking medications correctly causing uncontrolled pain and poor functional mobility I will continue to titrate her pain medicine as needed I was not able to discuss chemotherapy with the patient or family members due to her inability to lie still for her appointment I will reschedule our discussion about chemotherapy to March 17 In the meantime, I would highly recommend the patient keep appointment with palliative care consult end of the week for additional management of her pain Medical non-compliance She has history of medical noncompliance and has not been taking her medications correctly Currently, I reinforced the importance of her taking all her medications the way it is prescribed I will reassess again in her next visit and recommend she brings all her pill bottles with her Cancer associated pain She continues to have poorly controlled pain She is currently taking methadone 70 mg liquid (prescribed by her pain management specialist from [place]), 40 mg in the afternoon and 40 mg in the evening She is also taking oxycodone 20 mg as needed for the past 10 days with poorly controlled pain She has not been taking dexamethasone as prescribed I reinforced the importance of her taking dexamethasone I will increase breakthrough oxycodone to 30 mg as needed I will reassess her pain control again on March 17 As above, I highly recommend the patient to keep her appointment with palliative care for assistance in pain management Goals of care, counseling/discussion Counseled and coordinated advanced care planning today.  I tried  to discuss the role of chemotherapy with the patient but the patient is not interested to discuss this Her daughter, Valerie Kerr is interested to know more about chemotherapy I explained to her why surgery is not an option We discussed poor prognosis associated with this disease and with the patient's general decline in functional status, it is not clear to me she would be healthy enough to receive chemotherapy in the future  No orders of the defined types were placed in this encounter.    [name], MD  INTERVAL HISTORY: she returns for treatment follow-up From a previous visit, we are not able to discuss further due to the patient being late for her appointment and difficulties staying for the entire duration of her appointment due to pain Today, she continues to have pain She rated her pain at 4-5 out of 10 pain She is taking 70 mg of methadone in the morning, 40 mg in the afternoon and 40 mg in the evening along with oxycodone 20 mg as needed She has not been taking her dexamethasone She could not recall why she is not taking her dexamethasone Her daughter, Valerie Kerr would like to discuss treatment with me but due to the patient insistence of leaving, we are not able to discuss the role of chemotherapy fully We discussed prognosis and I reinforced the importance of taking her medications correctly  PHYSICAL EXAMINATION: ECOG PERFORMANCE STATUS: 3 - Symptomatic, >50% confined to bed  Vitals:   05/31/23 1412  BP: 136/83  Pulse: 97  Resp: 18  Temp: 97.8 F (36.6 C)  SpO2: 96%   Filed Weights   05/31/23 1412  Weight: 159 lb 11.2 oz (72.4 kg)

## 2023-05-31 NOTE — Assessment & Plan Note (Addendum)
 She continues to have poorly controlled pain She is currently taking methadone 70 mg liquid (prescribed by her pain management specialist from [place]), 40 mg in the afternoon and 40 mg in the evening She is also taking oxycodone 20 mg as needed for the past 10 days with poorly controlled pain She has not been taking dexamethasone as prescribed I reinforced the importance of her taking dexamethasone I will increase breakthrough oxycodone to 30 mg as needed I will reassess her pain control again on March 17 As above, I highly recommend the patient to keep her appointment with palliative care for assistance in pain management

## 2023-05-31 NOTE — Assessment & Plan Note (Addendum)
 She has history of medical noncompliance and has not been taking her medications correctly Currently, I reinforced the importance of her taking all her medications the way it is prescribed I will reassess again in her next visit and recommend she brings all her pill bottles with her

## 2023-05-31 NOTE — Assessment & Plan Note (Addendum)
 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 She is currently undergoing palliative radiation therapy  She is doing poorly due to poor understanding of medications, not taking medications correctly causing uncontrolled pain and poor functional mobility I will continue to titrate her pain medicine as needed I was not able to discuss chemotherapy with the patient or family members due to her inability to lie still for her appointment I will reschedule our discussion about chemotherapy to March 17 In the meantime, I would highly recommend the patient keep appointment with palliative care consult end of the week for additional management of her pain

## 2023-06-01 ENCOUNTER — Ambulatory Visit
Admission: RE | Admit: 2023-06-01 | Discharge: 2023-06-01 | Disposition: A | Discharge status: Home / Self Care | Payer: Medicare Other | Source: Ambulatory Visit | Attending: Radiation Oncology | Admitting: Radiation Oncology

## 2023-06-01 ENCOUNTER — Other Ambulatory Visit: Payer: Self-pay

## 2023-06-01 ENCOUNTER — Ambulatory Visit
Admission: RE | Admit: 2023-06-01 | Discharge: 2023-06-01 | Disposition: A | Discharge status: Home / Self Care | Source: Ambulatory Visit | Attending: Radiation Oncology | Admitting: Radiation Oncology

## 2023-06-01 ENCOUNTER — Other Ambulatory Visit (HOSPITAL_COMMUNITY): Payer: Self-pay

## 2023-06-01 ENCOUNTER — Encounter: Payer: Self-pay | Admitting: Hematology and Oncology

## 2023-06-01 ENCOUNTER — Other Ambulatory Visit: Payer: Self-pay | Admitting: Radiology

## 2023-06-01 DIAGNOSIS — R300 Dysuria: Secondary | ICD-10-CM

## 2023-06-01 DIAGNOSIS — Z510 Encounter for antineoplastic radiation therapy: Secondary | ICD-10-CM | POA: Diagnosis not present

## 2023-06-01 LAB — RAD ONC ARIA SESSION SUMMARY
Course Elapsed Days: 14
Plan Fractions Treated to Date: 8
Plan Prescribed Dose Per Fraction: 2.5 Gy
Plan Total Fractions Prescribed: 15
Plan Total Prescribed Dose: 37.5 Gy
Reference Point Dosage Given to Date: 20 Gy
Reference Point Session Dosage Given: 2.5 Gy
Session Number: 8

## 2023-06-01 LAB — URINALYSIS, COMPLETE (UACMP) WITH MICROSCOPIC
Bilirubin Urine: NEGATIVE
Glucose, UA: NEGATIVE mg/dL
Ketones, ur: NEGATIVE mg/dL
Nitrite: NEGATIVE
Protein, ur: 100 mg/dL — AB
RBC / HPF: >50 RBC/hpf (ref 0–5)
Specific Gravity, Urine: 1.013 (ref 1.005–1.030)
WBC, UA: >50 WBC/hpf (ref 0–5)
pH: 6 (ref 5.0–8.0)

## 2023-06-01 MED ORDER — PHENAZOPYRIDINE HCL 100 MG PO TABS
100.0000 mg | ORAL_TABLET | Freq: Three times a day (TID) | ORAL | 0 refills | Status: DC | PRN
Start: 2023-06-01 — End: 2023-06-19
  Filled 2023-06-01: qty 10, 4d supply, fill #0

## 2023-06-01 NOTE — Progress Notes (Signed)
 I saw the patient today for a one day history of dysuria and lower abdominal pressure. Patient also notes an odor to her urine and saw a small amount of blood in it earlier today. Given the treatment field, this is unlikely radiation induced. UA and culture obtained today. Prescription for pyridium sent to her pharmacy to pick up after she leaves today for symptom relief. I will call the patient with the results of the UA and plan moving forward.     Bryan Lemma, PA-C

## 2023-06-02 ENCOUNTER — Telehealth: Payer: Self-pay | Admitting: Nurse Practitioner

## 2023-06-02 ENCOUNTER — Other Ambulatory Visit (HOSPITAL_COMMUNITY): Payer: Self-pay

## 2023-06-02 ENCOUNTER — Telehealth: Payer: Self-pay | Admitting: Radiology

## 2023-06-02 ENCOUNTER — Ambulatory Visit: Payer: Medicare Other

## 2023-06-02 ENCOUNTER — Other Ambulatory Visit: Payer: Self-pay | Admitting: Radiology

## 2023-06-02 ENCOUNTER — Inpatient Hospital Stay: Payer: Medicare Other | Admitting: Nurse Practitioner

## 2023-06-02 DIAGNOSIS — R300 Dysuria: Secondary | ICD-10-CM

## 2023-06-02 DIAGNOSIS — N3000 Acute cystitis without hematuria: Secondary | ICD-10-CM

## 2023-06-02 MED ORDER — NITROFURANTOIN MONOHYD MACRO 100 MG PO CAPS
100.0000 mg | ORAL_CAPSULE | Freq: Two times a day (BID) | ORAL | 0 refills | Status: DC
  Filled 2023-06-02: qty 10, 5d supply, fill #0

## 2023-06-02 NOTE — Telephone Encounter (Signed)
 I called the patient to review the preliminary results of the urine culture which are indicative of an infection. Prescription for Macrobid sent to her preferred pharmacy.

## 2023-06-02 NOTE — Telephone Encounter (Signed)
 Patient unable to make appt, patient states have the runs.. Appointment canceled upon patient request.

## 2023-06-02 NOTE — Progress Notes (Deleted)
 Palliative Medicine [facility]  [phone] [phone]   Name: Valerie Kerr Date: 06/02/2023 MRN: [id number]  DOB: April 11, 1960  Patient Care Team: Hoy Register, MD as PCP - General (Family Medicine)    REASON FOR CONSULTATION: Valerie Kerr is a 63 y.o. 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 Palliative ask to see for symptom management and goals of care.    SOCIAL HISTORY:     reports that she has been smoking cigarettes. She has a 9 pack-year smoking history. She has never used smokeless tobacco. She reports that she does not drink alcohol and does not use drugs.  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---  treatment concurrent chemo (week 6 on hold due to pancytopenia) and radiation therapy (external beam 04-12-2016 to 05-31-2016)  started high-dose rate brachytherapy 05-26-2016   Chemotherapy-induced thrombocytopenia    Chronic pain disorder    back,neck-- chronic methadone   Environmental allergies    allergy to dust mites   GERD (gastroesophageal reflux disease)    Headache    Herniated disc, cervical    History of external beam radiation therapy 04/12/16-05/31/16   pelvis 45 Gy in 25 fractions, in  30 sessions, pelvis boost 9 Gy in 5 fractions   History of radiation therapy 06/20/16-07/04/16   tandem and ring applicator to cervix 28 Gy in 5 fractions   Hypertension    Hypokalemia    Hypomagnesemia    po supplement and IV replacement   Leukopenia due to antineoplastic chemotherapy (HCC)    Lumbar herniated disc    Pancytopenia due to chemotherapy (HCC)    Periodontitis     PAST SURGICAL  HISTORY:  Past Surgical History:  Procedure Laterality Date   ABCESS DRAINAGE Left 1982   breast   CARPAL TUNNEL RELEASE Bilateral    CERVICAL DISC SURGERY  2010   MULTIPLE EXTRACTIONS WITH ALVEOLOPLASTY N/A 04/08/2016   Procedure: Extraction of tooth #'s 1,4,78,29,56 with alveoloplasty  and gross debridement of remaining teeth;  Surgeon: Charlynne Pander, DDS;  Location: [facility] OR;  Service: Oral Surgery;  Laterality: N/A;   TANDEM RING INSERTION N/A 05/26/2016   Procedure: TANDEM RING INSERTION;  Surgeon: Antony Blackbird, MD;  Location: [facility];  Service: Urology;  Laterality: N/A;   TANDEM RING INSERTION N/A 06/02/2016   Procedure: TANDEM RING INSERTION;  Surgeon: Antony Blackbird, MD;  Location: [facility];  Service: Urology;  Laterality: N/A;   TANDEM RING INSERTION N/A 06/20/2016   Procedure: TANDEM RING INSERTION;  Surgeon: Antony Blackbird, MD;  Location: [facility];  Service: Urology;  Laterality: N/A;   TANDEM RING INSERTION N/A 06/23/2016   Procedure: TANDEM RING INSERTION;  Surgeon: Antony Blackbird, MD;  Location: [facility];  Service: Urology;  Laterality: N/A;   TANDEM RING INSERTION N/A 07/04/2016   Procedure: TANDEM RING INSERTION;  Surgeon: Antony Blackbird, MD;  Location: [facility];  Service: Urology;  Laterality: N/A;    HEMATOLOGY/ONCOLOGY HISTORY:  Oncology History  Cervical cancer (HCC)  02/20/2016 Initial Diagnosis   She presented to the ED with vaginal bleeding and abnormal pelvic mass. She was seen in  the office by Dr Genevie Ann 03/01/16 who performed cervical biopsies of a friable mass which revealed poorly differentiated squamous cell carcinoma.   03/01/2016 Pathology Results   1. Cervix, biopsy, mass INFILTRATIVE SQUAMOUS CELL CARCINOMA 2. Cervix, biopsy INVASIVE POORLY DIFFERENTIATED SQUAMOUS CELL CARCINOMA   03/03/2016 Imaging   Pelvic US was Suboptimal study due to bowel gas. Difficult visualization of the  uterus and adnexal structures appear probable multiple fibroids within the uterus. Endometrium thickened at 14 mm. In the setting of post-menopausal bleeding, endometrial sampling is indicated to exclude carcinoma. If results are benign, sonohysterogram should be considered for focal lesion work-up.    03/23/2016 Miscellaneous   Examination by Dr. Andrey Farmer in the office concluded the cervix is completely replaced by a 6cm exophytic friable mass which is replacing the cervix and encroaching the upper vaginal fornices and the parametrium bilaterally.   04/04/2016 PET scan   5.5 cm hypermetabolic cervical mass, consistent with known primary cervical carcinoma. No definite local or distant metastatic disease. Sub-cm hypermetabolic left axillary lymph nodes, likely reactive in etiology. Recommend continued attention on follow-up imaging.     04/08/2016 Procedure   Dr. Kristin Bruins performed Multiple extraction of tooth numbers 3, 9, 21, 30, and 31 .  2 Quadrants of alveoloplasty 3. Gross debridement of remaining dentition      04/12/2016 - 05/09/2016 Chemotherapy   The patient had weekly concurrent chemotherapy with radiation    04/12/2016 - 07/04/2016 Radiation Therapy   Radiation treatment dates:  04/12/16-05/31/16                                                 06/20/16-07/04/16   Site/dose:  1) Pelvis/ 45 Gy in 25 fractions                         2) Pelvis Boost/ 9 Gy in 5 fractions                         3) Cervix / 28 Gy in 5 fractions   Beams/energy:  1) 3D / 15X                                     2) Isodose plan/ 15X                                     3) HDR Ir-192 Cervix / Iridium HDR, tandem/ring applicator   05/16/2016 Adverse Reaction   Week 6 of chemotherapy is placed on hold due to pancytopenia.   06/29/2016 PET scan   Generally markedly improved in appearance, with resolved masslike appearance of the cervix and dramatic reduction in cervical metabolic activity. Moreover, the left  axillary nodes shown on the prior exam are no longer hypermetabolic. 2. However, there is a new small hypermetabolic focus in the right inguinal region corresponding to a suspected lymph node along a vascular confluence, maximum SUV 6.4. This merits surveillance. 3. Other imaging findings of potential clinical significance: Mucous retention cyst in the left maxillary sinus. Aortic atherosclerosis. Nonobstructive left nephrolithiasis. Spondylosis and degenerative disc disease in the lower lumbar spine.   Leiomyosarcoma (HCC)  04/19/2023 PET  scan   CT Biopsy Result Date: 04/27/2023 INDICATION: 63 year old female referred for biopsy destructive sacral mass. EXAM: CT BIOPSY MEDICATIONS: None. ANESTHESIA/SEDATION: Moderate (conscious) sedation was employed during this procedure. A total of Versed 1.0 mg and Fentanyl 100 mcg was administered intravenously. Moderate Sedation Time: 12 minutes. The patient's level of consciousness and vital signs were monitored continuously by radiology nursing throughout the procedure under my direct supervision. FLUOROSCOPY TIME:  CT COMPLICATIONS: None PROCEDURE: Informed written consent was obtained from the patient after a thorough discussion of the procedural risks, benefits and alternatives. All questions were addressed. Maximal Sterile Barrier Technique was utilized including caps, mask, sterile gowns, sterile gloves, sterile drape, hand hygiene and skin antiseptic. A timeout was performed prior to the initiation of the procedure. Patient was positioned left decubitus on the CT gantry table. Scout CT of the pelvis acquired for planning purposes. The patient was then prepped and draped in the usual sterile fashion. 1% lidocaine was used for local anesthesia. Using CT guidance, 17 gauge guide needle was addressed do's from a posterior approach into the right aspect of the sacral mass. Once we confirmed the cannula position, a 20 cm Chiba needle was carefully passed through the  cannula to assure that there was no at risk sacral nerve. The patient did not feel any additional pain upon passage of the Chiba needle. Multiple 18 gauge core biopsy were then acquired comfortably. Needle was removed. Patient tolerated the procedure well and remained hemodynamically stable throughout. No complications were encountered and no significant blood loss. IMPRESSION: Status post CT-guided biopsy of sacral mass. Signed, Yvone Neu. Miachel Roux, RPVI [facility] Electronically Signed   By: Gilmer Mor D.O.   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 EXAM: NUCLEAR MEDICINE PET SKULL BASE TO THIGH TECHNIQUE: 8.4 mCi F-18 FDG was injected intravenously. Full-ring PET imaging was performed from the skull base to thigh after the radiotracer. CT data was obtained and used for attenuation correction and anatomic localization. Fasting blood glucose: 149 mg/dl COMPARISON:  PET-CT 16/12/9602 FINDINGS: Mediastinal blood pool activity: SUV max 2.3 Liver activity: SUV max NA NECK: Prominent but symmetric glottic/arytenoid activity, maximum SUV 12.8, likely physiologic. Incidental CT findings: Chronic left maxillary sinusitis. Mild bilateral common carotid atheromatous vascular calcifications. CHEST: Small focus of accentuated activity along the cutaneous or immediate subcutaneous surface of the left lower breast as shown on image 84 series 607, maximum SUV 7.5, questionable faint cutaneous thickening or subcutaneous adipose tissue stranding in this vicinity, diagnostic mammographic correlation suggested. Incidental CT findings: Mild mitral valve calcification. ABDOMEN/PELVIS: No significant abnormal hypermetabolic activity in this region. Incidental CT findings: Atherosclerosis is present,  including aortoiliac atherosclerotic disease. SKELETON: Hypermetabolic sacral mass measuring about 8.0 by 4.8 cm, maximum SUV 13.2. I am skeptical that this degree of bony lysis would be due to a benign fracture although there are superimposed pathologic fractures extending through the sacrum. Abnormal erosions are present along the iliac sides of the sacroiliac joints and could be from arthropathy or less likely tumor or infection. These erosions are not as hypermetabolic as the main sacral finding. Vertical fracture of the right L5 transverse process extending partially into the vertebral body on image 130 series 4, with hypermetabolic activity up to maximum SUV 8.3, more likely to be benign given the linear configuration of the  fracture. Chronic fracture of the right pubis without hypermetabolic activity, but with evidence of bony nonunion. Small focus of activity along the right anterior ninth intercostal space just anterior to the right hepatic lobe, maximum SUV 4.3, probably physiologic muscular activity, less likely mid misregistration a rib lesion. Incidental CT findings: None. IMPRESSION: 1. Hypermetabolic sacral mass measuring about 8.0 by 4.8 cm, maximum SUV 13.2. Appearance favors active malignancy given the degree of soft tissue density and infiltration. 2. Abnormal erosions along the iliac sides of the sacroiliac joints, not as hypermetabolic as the main sacral finding. These erosions could be from arthropathy or less likely tumor or infection. 3. Vertical fracture of the right L5 transverse process extending partially into the vertebral body, with hypermetabolic activity up to maximum SUV 8.3, more likely to be benign given the linear configuration of the fracture. 4. Chronic fracture of the right pubis without hypermetabolic activity, but with evidence of bony nonunion. 5. Small focus of accentuated activity along the cutaneous or immediate subcutaneous surface of the left lower breast, maximum SUV  7.5, questionable faint cutaneous thickening or subcutaneous adipose tissue stranding in this vicinity, diagnostic mammographic correlation suggested. 6. Small focus of activity along the right anterior ninth intercostal space just anterior to the right hepatic lobe, maximum SUV 4.3, probably physiologic muscular activity, less likely mid misregistration an otherwise occult rib lesion. 7. Prominent but symmetric glottic/arytenoid activity, maximum SUV 12.8, likely physiologic. 8. Chronic left maxillary sinusitis. 9. Aortic atherosclerosis. Electronically Signed   By: Gaylyn Rong M.D.   On: 04/27/2023 10:40      04/27/2023 Pathology Results   SURGICAL PATHOLOGY  CASE: [id number]  PATIENT: Valerie Kerr  Surgical Pathology Report   Clinical History: suspect mets (cm)   FINAL MICROSCOPIC DIAGNOSIS:   A. 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  See comment.   COMMENT:  The core biopsies show a spindle cell neoplasm characterized by fascicles of elongated cells with nuclear pleomorphism scattered mitotic figures.  There is a microscopic focus suspicious for lymphovascular involvement.  Immunohistochemistry shows focal, patchy positivity with smooth muscle actin and negative staining with desmin cytokeratin AE1/AE3, cytokeratin 5/6, cytokeratin 903 and S100.  The morphology and focal smooth muscle actin positivity favors high-grade leiomyosarcoma    05/16/2023 Initial Diagnosis   Leiomyosarcoma (HCC)   05/16/2023 Cancer Staging   Staging form: Soft Tissue Sarcoma, AJCC 7th Edition - Clinical stage from 05/16/2023: Stage IV (T2, N0, M1) - Signed by [name], MD on 05/16/2023 Stage prefix: Initial diagnosis Biopsy of metastatic site performed: Yes Source of metastatic specimen: Bone   05/26/2023 Echocardiogram       1. Left ventricular ejection fraction, by estimation, is 55 to 60%. The left ventricle has normal function. The  left ventricle has no regional wall motion abnormalities. Left ventricular diastolic parameters were normal.  2. Right ventricular systolic function is normal. The right ventricular size is normal.  3. The mitral valve is normal in structure. No evidence of mitral valve regurgitation. No evidence of mitral stenosis.  4. The aortic valve is tricuspid. Aortic valve regurgitation is not visualized. No aortic stenosis is present.  5. The inferior vena cava is normal in size with greater than 50% respiratory variability, suggesting right atrial pressure of 3 mmHg.   05/30/2023 -  Chemotherapy   Patient is on Treatment Plan : UTERINE LEIOMYOSARCOMA Doxorubicin (75) q21d x 6 Cycles       ALLERGIES:  is allergic to codeine,  fentanyl, hydrocodone-acetaminophen, and tramadol.  MEDICATIONS:  Current Outpatient Medications  Medication Sig Dispense Refill   ALPRAZolam (XANAX) 0.25 MG tablet Take 0.5 tablets (0.125 mg total) by mouth 2 (two) times daily as needed for anxiety. 30 tablet 0   cetirizine (ZYRTEC) 10 MG tablet TAKE 1 TABLET BY MOUTH EVERY DAY 90 tablet 0   cyclobenzaprine (FLEXERIL) 10 MG tablet Take by mouth.     dexamethasone (DECADRON) 4 MG tablet Take 1 tablet (4 mg total) by mouth 2 (two) times daily with a meal. 60 tablet 0   fluticasone (FLONASE) 50 MCG/ACT nasal spray INSTILL 1 SPRAY INTO EACH NOSTRIL DAILY 48 mL 0   levocetirizine (XYZAL) 5 MG tablet Take 5 mg by mouth at bedtime. (Patient not taking: Reported on 04/13/2023)     lidocaine (LIDODERM) 5 % Place 1 patch onto the skin daily. Remove & Discard patch within 12 hours or as directed by MD 30 patch 0   lidocaine-prilocaine (EMLA) cream Apply to affected area as directed. 30 g 3   lisinopril-hydrochlorothiazide (ZESTORETIC) 20-25 MG tablet Take 1 tablet by mouth daily. 30 tablet 0   methadone (DOLOPHINE) 10 MG tablet Take 4 tablets (40 mg total) by mouth every 12 (twelve) hours. 90 tablet 0   omeprazole (PRILOSEC) 20 MG capsule  Take 1 capsule (20 mg total) by mouth daily. Must have office visit for refills 90 capsule 0   ondansetron (ZOFRAN) 8 MG tablet Take 1 tablet (8 mg total) by mouth every 8 (eight) hours as needed for nausea or vomiting. Start on the third day after chemotherapy. 30 tablet 1   oxycodone (ROXICODONE) 30 MG immediate release tablet Take 1 tablet (30 mg total) by mouth every 4 (four) hours as needed. 90 tablet 0   phenazopyridine (PYRIDIUM) 100 MG tablet Take 1 tablet (100 mg total) by mouth 3 (three) times daily as needed for pain. 10 tablet 0   prochlorperazine (COMPAZINE) 10 MG tablet Take 1 tablet (10 mg total) by mouth every 6 (six) hours as needed for nausea. 30 tablet 0   prochlorperazine (COMPAZINE) 10 MG tablet Take 1 tablet (10 mg total) by mouth every 6 (six) hours as needed for nausea or vomiting. 30 tablet 1   Vitamin D, Ergocalciferol, (DRISDOL) 50000 units CAPS capsule Take 1 capsule (50,000 Units total) by mouth every 7 (seven) days. (Patient taking differently: Take 50,000 Units by mouth every Saturday.) 16 capsule 0   VOLTAREN 1 % GEL Apply 4 g topically 4 (four) times daily. 100 g 0   No current facility-administered medications for this visit.    VITAL SIGNS: There were no vitals taken for this visit. There were no vitals filed for this visit.  Estimated body mass index is 29.69 kg/m as calculated from the following:   Height as of 05/31/23: 5' 1.5" (1.562 m).   Weight as of 05/31/23: 159 lb 11.2 oz (72.4 kg).  LABS: CBC:    Component Value Date/Time   WBC 7.9 02/10/2020 1416   WBC 6.9 11/25/2017 2020   HGB 12.3 02/10/2020 1416   HGB 10.9 (L) 08/15/2016 1614   HCT 39.2 02/10/2020 1416   HCT 34.1 (L) 08/15/2016 1614   PLT 265 02/10/2020 1416   PLT 196 08/15/2016 1614   MCV 89.7 02/10/2020 1416   MCV 93.2 08/15/2016 1614   NEUTROABS 5.5 02/10/2020 1416   NEUTROABS 2.3 08/15/2016 1614   LYMPHSABS 1.8 02/10/2020 1416   LYMPHSABS 0.8 (L) 08/15/2016 1614   MONOABS 0.5  02/10/2020 1416   MONOABS 0.3 08/15/2016 1614   EOSABS 0.1 02/10/2020 1416   EOSABS 0.1 08/15/2016 1614   BASOSABS 0.0 02/10/2020 1416   BASOSABS 0.0 08/15/2016 1614   Comprehensive Metabolic Panel:    Component Value Date/Time   NA 140 02/11/2020 1401   NA 143 08/15/2016 1614   K 4.5 02/11/2020 1401   K 4.3 08/15/2016 1614   CL 98 02/11/2020 1401   CO2 33 (H) 02/11/2020 1401   CO2 31 (H) 08/15/2016 1614   BUN 18 02/11/2020 1401   BUN 13.5 08/15/2016 1614   CREATININE 0.99 02/11/2020 1401   CREATININE 0.9 08/15/2016 1614   GLUCOSE 127 (H) 02/11/2020 1401   GLUCOSE 113 08/15/2016 1614   CALCIUM 9.5 02/11/2020 1401   CALCIUM 9.3 08/15/2016 1614   AST 36 02/11/2020 1401   AST 18 06/29/2016 0921   ALT 34 02/11/2020 1401   ALT 12 06/29/2016 0921   ALKPHOS 82 02/11/2020 1401   ALKPHOS 76 06/29/2016 0921   BILITOT 0.4 02/11/2020 1401   BILITOT 0.46 06/29/2016 0921   PROT 8.3 (H) 02/11/2020 1401   PROT 7.2 06/29/2016 0921   ALBUMIN 3.9 02/11/2020 1401   ALBUMIN 3.7 06/29/2016 0921    RADIOGRAPHIC STUDIES:  PERFORMANCE STATUS (ECOG) : {CHL ONC ECOG [phone]}  Review of Systems Unless otherwise noted, a complete review of systems is negative.  Physical Exam General: NAD Cardiovascular: regular rate and rhythm Pulmonary: clear ant fields Abdomen: soft, nontender, + bowel sounds Extremities: no edema, no joint deformities Skin: no rashes Neurological: Alert and oriented x3  IMPRESSION: *** I introduced myself, Alayzia Pavlock RN, and Palliative's role in collaboration with the oncology team. Concept of Palliative Care was introduced as specialized medical care for people and their families living with serious illness.  It focuses on providing relief from the symptoms and stress of a serious illness.  The goal is to improve quality of life for both the patient and the family. Values and goals of care important to patient and family were attempted to be elicited.    We  discussed *** current illness and what it means in the larger context of *** on-going co-morbidities. Natural disease trajectory and expectations were discussed.  I discussed the importance of continued conversation with family and their medical providers regarding overall plan of care and treatment options, ensuring decisions are within the context of the patients values and GOCs.  PLAN: Established therapeutic relationship. Education provided on palliative's role in collaboration with their Oncology/Radiation team. I will plan to see patient back in 2-4 weeks in collaboration to other oncology appointments.    Patient expressed understanding and was in agreement with this plan. She also understands that She can call the clinic at any time with any questions, concerns, or complaints.   Thank you for your referral and allowing Palliative to assist in Valerie Kerr's care.   Number and complexity of problems addressed: ***HIGH - 1 or more chronic illnesses with SEVERE exacerbation, progression, or side effects of treatment - advanced cancer, pain. Any controlled substances utilized were prescribed in the context of palliative care.   Visit consisted of counseling and education dealing with the complex and emotionally intense issues of symptom management and palliative care in the setting of serious and potentially life-threatening illness.  Signed by: Willette Alma, AGPCNP-BC Palliative Medicine Team/[facility]

## 2023-06-03 LAB — URINE CULTURE: Culture: 60000 — AB

## 2023-06-04 ENCOUNTER — Other Ambulatory Visit: Payer: Self-pay

## 2023-06-05 ENCOUNTER — Ambulatory Visit: Payer: Medicare Other

## 2023-06-06 ENCOUNTER — Ambulatory Visit

## 2023-06-06 ENCOUNTER — Telehealth: Payer: Self-pay | Admitting: Nurse Practitioner

## 2023-06-06 ENCOUNTER — Other Ambulatory Visit: Payer: Self-pay

## 2023-06-06 ENCOUNTER — Ambulatory Visit: Payer: Medicare Other

## 2023-06-06 ENCOUNTER — Ambulatory Visit
Admission: RE | Admit: 2023-06-06 | Discharge: 2023-06-06 | Disposition: A | Discharge status: Home / Self Care | Payer: Medicare Other | Source: Ambulatory Visit | Attending: Radiation Oncology | Admitting: Radiation Oncology

## 2023-06-06 DIAGNOSIS — G893 Neoplasm related pain (acute) (chronic): Secondary | ICD-10-CM | POA: Diagnosis not present

## 2023-06-06 LAB — RAD ONC ARIA SESSION SUMMARY
Course Elapsed Days: 19
Plan Fractions Treated to Date: 9
Plan Prescribed Dose Per Fraction: 2.5 Gy
Plan Total Fractions Prescribed: 15
Plan Total Prescribed Dose: 37.5 Gy
Reference Point Dosage Given to Date: 22.5 Gy
Reference Point Session Dosage Given: 2.5 Gy
Session Number: 9

## 2023-06-07 ENCOUNTER — Telehealth: Payer: Self-pay

## 2023-06-07 ENCOUNTER — Ambulatory Visit: Payer: Medicare Other

## 2023-06-07 ENCOUNTER — Other Ambulatory Visit: Payer: Self-pay

## 2023-06-07 NOTE — Telephone Encounter (Signed)
 Ms. Horn called in stating that she was cancelling her radiation treatment due to 10/10 pain. She stated that she was having trouble walking this morning and would probably check in to the ED. Linac machine 4 was made aware of cancellation.

## 2023-06-08 ENCOUNTER — Emergency Department (HOSPITAL_COMMUNITY)

## 2023-06-08 ENCOUNTER — Ambulatory Visit: Payer: Medicare Other

## 2023-06-08 ENCOUNTER — Encounter: Payer: Self-pay | Admitting: Hematology and Oncology

## 2023-06-08 ENCOUNTER — Inpatient Hospital Stay (HOSPITAL_COMMUNITY)
Admission: EM | Admit: 2023-06-08 | Discharge: 2023-06-19 | DRG: 948 | Disposition: A | Discharge status: Home / Self Care | Attending: Internal Medicine | Admitting: Internal Medicine

## 2023-06-08 ENCOUNTER — Encounter (HOSPITAL_COMMUNITY): Payer: Self-pay | Admitting: Emergency Medicine

## 2023-06-08 ENCOUNTER — Other Ambulatory Visit: Payer: Self-pay

## 2023-06-08 DIAGNOSIS — C499 Malignant neoplasm of connective and soft tissue, unspecified: Secondary | ICD-10-CM

## 2023-06-08 DIAGNOSIS — Z7189 Other specified counseling: Secondary | ICD-10-CM

## 2023-06-08 DIAGNOSIS — Z8541 Personal history of malignant neoplasm of cervix uteri: Secondary | ICD-10-CM

## 2023-06-08 DIAGNOSIS — C7951 Secondary malignant neoplasm of bone: Secondary | ICD-10-CM

## 2023-06-08 DIAGNOSIS — Z6829 Body mass index (BMI) 29.0-29.9, adult: Secondary | ICD-10-CM

## 2023-06-08 DIAGNOSIS — F411 Generalized anxiety disorder: Secondary | ICD-10-CM | POA: Insufficient documentation

## 2023-06-08 DIAGNOSIS — M533 Sacrococcygeal disorders, not elsewhere classified: Principal | ICD-10-CM

## 2023-06-08 DIAGNOSIS — Z5941 Food insecurity: Secondary | ICD-10-CM

## 2023-06-08 DIAGNOSIS — R300 Dysuria: Secondary | ICD-10-CM | POA: Diagnosis present

## 2023-06-08 DIAGNOSIS — Z885 Allergy status to narcotic agent status: Secondary | ICD-10-CM

## 2023-06-08 DIAGNOSIS — Z800 Family history of malignant neoplasm of digestive organs: Secondary | ICD-10-CM

## 2023-06-08 DIAGNOSIS — K219 Gastro-esophageal reflux disease without esophagitis: Secondary | ICD-10-CM | POA: Diagnosis present

## 2023-06-08 DIAGNOSIS — Z91199 Patient's noncompliance with other medical treatment and regimen due to unspecified reason: Secondary | ICD-10-CM

## 2023-06-08 DIAGNOSIS — M8448XA Pathological fracture, other site, initial encounter for fracture: Secondary | ICD-10-CM | POA: Diagnosis present

## 2023-06-08 DIAGNOSIS — Z923 Personal history of irradiation: Secondary | ICD-10-CM

## 2023-06-08 DIAGNOSIS — Z801 Family history of malignant neoplasm of trachea, bronchus and lung: Secondary | ICD-10-CM

## 2023-06-08 DIAGNOSIS — G894 Chronic pain syndrome: Secondary | ICD-10-CM

## 2023-06-08 DIAGNOSIS — F17210 Nicotine dependence, cigarettes, uncomplicated: Secondary | ICD-10-CM | POA: Diagnosis present

## 2023-06-08 DIAGNOSIS — Z9221 Personal history of antineoplastic chemotherapy: Secondary | ICD-10-CM

## 2023-06-08 DIAGNOSIS — I10 Essential (primary) hypertension: Secondary | ICD-10-CM | POA: Diagnosis present

## 2023-06-08 DIAGNOSIS — R5381 Other malaise: Secondary | ICD-10-CM | POA: Diagnosis present

## 2023-06-08 DIAGNOSIS — K760 Fatty (change of) liver, not elsewhere classified: Secondary | ICD-10-CM | POA: Diagnosis present

## 2023-06-08 DIAGNOSIS — G893 Neoplasm related pain (acute) (chronic): Secondary | ICD-10-CM | POA: Diagnosis not present

## 2023-06-08 DIAGNOSIS — I959 Hypotension, unspecified: Secondary | ICD-10-CM | POA: Diagnosis not present

## 2023-06-08 DIAGNOSIS — Z808 Family history of malignant neoplasm of other organs or systems: Secondary | ICD-10-CM

## 2023-06-08 DIAGNOSIS — C495 Malignant neoplasm of connective and soft tissue of pelvis: Secondary | ICD-10-CM | POA: Diagnosis present

## 2023-06-08 DIAGNOSIS — K5903 Drug induced constipation: Secondary | ICD-10-CM

## 2023-06-08 DIAGNOSIS — Z91148 Patient's other noncompliance with medication regimen for other reason: Secondary | ICD-10-CM

## 2023-06-08 DIAGNOSIS — Z23 Encounter for immunization: Secondary | ICD-10-CM

## 2023-06-08 DIAGNOSIS — E663 Overweight: Secondary | ICD-10-CM | POA: Diagnosis present

## 2023-06-08 DIAGNOSIS — C419 Malignant neoplasm of bone and articular cartilage, unspecified: Secondary | ICD-10-CM

## 2023-06-08 DIAGNOSIS — Z79899 Other long term (current) drug therapy: Secondary | ICD-10-CM

## 2023-06-08 DIAGNOSIS — Z515 Encounter for palliative care: Secondary | ICD-10-CM

## 2023-06-08 DIAGNOSIS — T402X5A Adverse effect of other opioids, initial encounter: Secondary | ICD-10-CM | POA: Diagnosis present

## 2023-06-08 LAB — CBC
HCT: 34 % — ABNORMAL LOW (ref 36.0–46.0)
Hemoglobin: 10.5 g/dL — ABNORMAL LOW (ref 12.0–15.0)
MCH: 27.6 pg (ref 26.0–34.0)
MCHC: 30.9 g/dL (ref 30.0–36.0)
MCV: 89.5 fL (ref 80.0–100.0)
Platelets: 296 10*3/uL (ref 150–400)
RBC: 3.8 MIL/uL — ABNORMAL LOW (ref 3.87–5.11)
RDW: 13.6 % (ref 11.5–15.5)
WBC: 8.3 10*3/uL (ref 4.0–10.5)
nRBC: 0 % (ref 0.0–0.2)

## 2023-06-08 LAB — BASIC METABOLIC PANEL
Anion gap: 9 (ref 5–15)
BUN: 17 mg/dL (ref 8–23)
CO2: 29 mmol/L (ref 22–32)
Calcium: 8.9 mg/dL (ref 8.9–10.3)
Chloride: 99 mmol/L (ref 98–111)
Creatinine, Ser: 0.74 mg/dL (ref 0.44–1.00)
GFR, Estimated: >60 mL/min (ref >60)
Glucose, Bld: 127 mg/dL — ABNORMAL HIGH (ref 70–99)
Potassium: 3.7 mmol/L (ref 3.5–5.1)
Sodium: 137 mmol/L (ref 135–145)

## 2023-06-08 MED ORDER — ENOXAPARIN SODIUM 40 MG/0.4ML IJ SOSY
40.0000 mg | PREFILLED_SYRINGE | INTRAMUSCULAR | Status: DC
  Administered 2023-06-09 – 2023-06-19 (×11): 40 mg via SUBCUTANEOUS
  Filled 2023-06-08 (×11): qty 0.4

## 2023-06-08 MED ORDER — ALPRAZOLAM 0.25 MG PO TABS
0.1250 mg | ORAL_TABLET | Freq: Two times a day (BID) | ORAL | Status: DC | PRN
  Administered 2023-06-08 – 2023-06-19 (×2): 0.125 mg via ORAL
  Filled 2023-06-08 (×2): qty 1

## 2023-06-08 MED ORDER — PROCHLORPERAZINE MALEATE 10 MG PO TABS
10.0000 mg | ORAL_TABLET | Freq: Four times a day (QID) | ORAL | Status: DC | PRN
  Administered 2023-06-13 – 2023-06-14 (×2): 10 mg via ORAL
  Filled 2023-06-08 (×2): qty 1

## 2023-06-08 MED ORDER — SODIUM CHLORIDE 0.9% FLUSH
3.0000 mL | Freq: Two times a day (BID) | INTRAVENOUS | Status: DC
  Administered 2023-06-08 – 2023-06-19 (×21): 3 mL via INTRAVENOUS

## 2023-06-08 MED ORDER — METHADONE HCL 10 MG PO TABS
40.0000 mg | ORAL_TABLET | Freq: Three times a day (TID) | ORAL | Status: DC
  Administered 2023-06-09 (×2): 40 mg via ORAL
  Filled 2023-06-08 (×2): qty 4

## 2023-06-08 MED ORDER — SODIUM CHLORIDE 0.9% FLUSH: 3.0000 mL | INTRAVENOUS | Status: DC | PRN

## 2023-06-08 MED ORDER — DOCUSATE SODIUM 100 MG PO CAPS
100.0000 mg | ORAL_CAPSULE | Freq: Two times a day (BID) | ORAL | Status: DC
  Administered 2023-06-08 – 2023-06-19 (×21): 100 mg via ORAL
  Filled 2023-06-08 (×22): qty 1

## 2023-06-08 MED ORDER — HYDROMORPHONE HCL 1 MG/ML IJ SOLN
2.0000 mg | Freq: Once | INTRAMUSCULAR | Status: AC
  Administered 2023-06-08: 2 mg via INTRAVENOUS
  Filled 2023-06-08: qty 2

## 2023-06-08 MED ORDER — OXYCODONE HCL 5 MG PO TABS
30.0000 mg | ORAL_TABLET | Freq: Four times a day (QID) | ORAL | Status: DC | PRN
  Administered 2023-06-08 – 2023-06-09 (×2): 30 mg via ORAL
  Filled 2023-06-08 (×2): qty 6

## 2023-06-08 MED ORDER — HYDROMORPHONE HCL 1 MG/ML IJ SOLN
1.0000 mg | INTRAMUSCULAR | Status: AC | PRN
  Administered 2023-06-08 (×2): 1 mg via INTRAVENOUS
  Filled 2023-06-08 (×2): qty 1

## 2023-06-08 MED ORDER — SODIUM CHLORIDE 0.9 % IV BOLUS
500.0000 mL | Freq: Once | INTRAVENOUS | Status: AC
  Administered 2023-06-08: 500 mL via INTRAVENOUS

## 2023-06-08 MED ORDER — PANTOPRAZOLE SODIUM 40 MG PO TBEC
40.0000 mg | DELAYED_RELEASE_TABLET | Freq: Every day | ORAL | Status: DC
  Administered 2023-06-08 – 2023-06-19 (×12): 40 mg via ORAL
  Filled 2023-06-08 (×12): qty 1

## 2023-06-08 MED ORDER — NALOXONE HCL 0.4 MG/ML IJ SOLN: 0.4000 mg | INTRAMUSCULAR | Status: DC | PRN

## 2023-06-08 MED ORDER — IOHEXOL 300 MG/ML  SOLN
100.0000 mL | Freq: Once | INTRAMUSCULAR | Status: AC | PRN
  Administered 2023-06-08: 100 mL via INTRAVENOUS

## 2023-06-08 MED ORDER — LIDOCAINE 5 % EX PTCH
2.0000 | MEDICATED_PATCH | CUTANEOUS | Status: DC
  Administered 2023-06-09 – 2023-06-18 (×10): 2 via TRANSDERMAL
  Filled 2023-06-08 (×12): qty 2

## 2023-06-08 MED ORDER — ENSURE ENLIVE PO LIQD
237.0000 mL | Freq: Two times a day (BID) | ORAL | Status: DC
  Administered 2023-06-09 – 2023-06-19 (×15): 237 mL via ORAL

## 2023-06-08 MED ORDER — OYSTER SHELL CALCIUM/D3 500-5 MG-MCG PO TABS
1.0000 | ORAL_TABLET | Freq: Two times a day (BID) | ORAL | Status: DC
  Administered 2023-06-08 – 2023-06-19 (×22): 1 via ORAL
  Filled 2023-06-08 (×24): qty 1

## 2023-06-08 MED ORDER — BISACODYL 5 MG PO TBEC: 5.0000 mg | DELAYED_RELEASE_TABLET | Freq: Every day | ORAL | Status: DC | PRN

## 2023-06-08 MED ORDER — IBUPROFEN 200 MG PO TABS
400.0000 mg | ORAL_TABLET | Freq: Four times a day (QID) | ORAL | Status: DC | PRN
  Filled 2023-06-08: qty 2

## 2023-06-08 MED ORDER — DEXAMETHASONE 4 MG PO TABS: 4.0000 mg | ORAL_TABLET | Freq: Two times a day (BID) | ORAL | Status: DC

## 2023-06-08 MED ORDER — METHADONE HCL 10 MG PO TABS
40.0000 mg | ORAL_TABLET | Freq: Two times a day (BID) | ORAL | Status: DC
  Administered 2023-06-08: 40 mg via ORAL
  Filled 2023-06-08: qty 8

## 2023-06-08 MED ORDER — HYDROMORPHONE HCL 1 MG/ML IJ SOLN
1.0000 mg | Freq: Once | INTRAMUSCULAR | Status: AC
  Administered 2023-06-08: 1 mg via INTRAVENOUS
  Filled 2023-06-08: qty 1

## 2023-06-08 MED ORDER — INFLUENZA VIRUS VACC SPLIT PF (FLUZONE) 0.5 ML IM SUSY
0.5000 mL | PREFILLED_SYRINGE | INTRAMUSCULAR | Status: DC
  Filled 2023-06-08: qty 0.5

## 2023-06-08 MED ORDER — PNEUMOCOCCAL 20-VAL CONJ VACC 0.5 ML IM SUSY
0.5000 mL | PREFILLED_SYRINGE | INTRAMUSCULAR | Status: DC
  Filled 2023-06-08: qty 0.5

## 2023-06-08 MED ORDER — SODIUM CHLORIDE 0.9% FLUSH
3.0000 mL | Freq: Two times a day (BID) | INTRAVENOUS | Status: DC
  Administered 2023-06-08 – 2023-06-18 (×19): 3 mL via INTRAVENOUS

## 2023-06-08 MED ORDER — SODIUM CHLORIDE 0.9 % IV SOLN: 250.0000 mL | INTRAVENOUS | Status: AC | PRN

## 2023-06-08 NOTE — H&P (Signed)
 History and Physical    Valerie Kerr XBJ:[id number] DOB: 1960-12-23 DOA: 06/08/2023  PCP: Hoy Register, MD   Patient coming from: Home   Chief Complaint:  Chief Complaint  Patient presents with   Back Pain   ED TRIAGE note:  Pt bib ems from home for chronic lower back, buttocks pain that radiates down legs.  Stage four pain medicine.  Takes methadone she is out and took oxycodone his morning.       HPI:  Valerie Kerr is a 63 y.o. 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 Patient also has evidence of pathologic spinal fractures. It also showed a chronic fracture of the right pubis without hypermetabolic activity without evidence of bony union. Patient has had poorly controlled back pain. Notes from her oncology appointment earlier this month indicate persistent pain with patient not complying with all of her medical treatment. Patient had her oxycodone medication increased. Patient was supposed to follow-up with pain management but she did not go to her appointment because of her pain. Patient is post to be on methadone but ran out of that prescription recently. Patient states she has not been able to move or get out of the bed in the last few days. She denies any fevers or chills. No focal numbness or weakness. The pain is severe in her lower back area. It does move towards the left side in her hip area no fevers or chills. No vomiting or diarrhea.   Per chart review of the oncology note patient has been taking methadone 70 mg  liquid prescribed by her pain management specialist in [place], 40 mg in the afternoon and 40 mg in the evening.  She has been also taking oxycodone 20 mg as needed.  She has been noncompliance with dexamethasone.  Oncologist has been increased oxycodone to 30 mg as needed for breakthrough pain.  Patient has been referred to to keep appointment with palliative care for assistant for pain management.   ED Course:  At presentation to ED hemodynamically stable. CBC unremarkable stable H&H. BMP unremarkable.  CT abdomen pelvis showing: As seen on prior PET-CT there is a large destructive mass involving the sacrum with extension to the sacroiliac joints, the at the disc at L5-S1 and potentially involvement of L5. Associated pathologic fractures involving the sacrum and the right transverse process of L5. This extends into the presacral space.  No developing lymph node enlargement seen in the abdomen and pelvis.  Fatty liver infiltration.  Please see separate PET-CT scan.  In the ED patient has been given Dilaudid 1 mg and methadone 40 mg.  Apparently patient has been missed multiple chemoradiation therapy due to unbearable pain.  Hospitalist has been consulted for further evaluation and management of acute on chronic pain and need oncologist evaluation while in the hospital.   Significant labs in the ED: Lab Orders         CBC         Basic metabolic panel         HIV Antibody (routine testing w rflx)         CBC  Comprehensive metabolic panel       Review of Systems:  Review of Systems  Constitutional:  Positive for malaise/fatigue. Negative for chills, fever and weight loss.  Respiratory:  Negative for cough, sputum production and shortness of breath.   Cardiovascular:  Negative for chest pain, palpitations and leg swelling.  Gastrointestinal:  Negative for abdominal pain, constipation, diarrhea, heartburn, nausea and vomiting.  Genitourinary:  Negative for dysuria, flank  pain, frequency, hematuria and urgency.  Musculoskeletal:  Positive for back pain. Negative for falls, joint pain, myalgias and neck pain.  Neurological:  Negative for dizziness and headaches.  Psychiatric/Behavioral:  The patient is not nervous/anxious.     Past Medical History:  Diagnosis Date   Anxiety    Cervical cancer [facility]) oncologist-  dr gorsuch/ dr Roselind Messier   02-20-2016 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---  treatment concurrent chemo (week 6 on hold due to pancytopenia) and radiation therapy (external beam 04-12-2016 to 05-31-2016)  started high-dose rate brachytherapy 05-26-2016   Chemotherapy-induced thrombocytopenia    Chronic pain disorder    back,neck-- chronic methadone   Environmental allergies    allergy to dust mites   GERD (gastroesophageal reflux disease)    Headache    Herniated disc, cervical    History of external beam radiation therapy 04/12/16-05/31/16   pelvis 45 Gy in 25 fractions, in  30 sessions, pelvis boost 9 Gy in 5 fractions   History of radiation therapy 06/20/16-07/04/16   tandem and ring applicator to cervix 28 Gy in 5 fractions   Hypertension    Hypokalemia    Hypomagnesemia    po supplement and IV replacement   Leukopenia due to antineoplastic chemotherapy (HCC)    Lumbar herniated disc    Pancytopenia due to chemotherapy (HCC)    Periodontitis     Past Surgical History:  Procedure Laterality Date   ABCESS DRAINAGE Left 1982   breast   CARPAL TUNNEL RELEASE Bilateral    CERVICAL DISC SURGERY  2010   MULTIPLE EXTRACTIONS WITH ALVEOLOPLASTY N/A 04/08/2016   Procedure: Extraction of tooth #'s 1,6,10,96,04 with alveoloplasty  and gross debridement of remaining teeth;  Surgeon: Charlynne Pander, DDS;  Location: [facility];  Service: Oral Surgery;  Laterality: N/A;   TANDEM RING INSERTION N/A 05/26/2016   Procedure: TANDEM RING INSERTION;  Surgeon: Antony Blackbird, MD;  Location: [facility];  Service: Urology;   Laterality: N/A;   TANDEM RING INSERTION N/A 06/02/2016   Procedure: TANDEM RING INSERTION;  Surgeon: Antony Blackbird, MD;  Location: [facility];  Service: Urology;  Laterality: N/A;   TANDEM RING INSERTION N/A 06/20/2016   Procedure: TANDEM RING INSERTION;  Surgeon: Antony Blackbird, MD;  Location: [facility];  Service: Urology;  Laterality: N/A;   TANDEM RING INSERTION N/A 06/23/2016   Procedure: TANDEM RING INSERTION;  Surgeon: Antony Blackbird, MD;  Location: [facility];  Service: Urology;  Laterality: N/A;   TANDEM RING INSERTION N/A 07/04/2016   Procedure: TANDEM RING INSERTION;  Surgeon: Antony Blackbird, MD;  Location: [facility];  Service: Urology;  Laterality: N/A;     reports that she has been smoking cigarettes. She has a 9 pack-year smoking history. She has never used smokeless tobacco. She reports that she does not drink alcohol and does not use drugs.  Allergies  Allergen Reactions   Codeine Itching and Rash   Fentanyl Rash    Skin rash, local swelling from patch   Hydrocodone-Acetaminophen  Rash   Tramadol Other (See Comments)    GI upset,also trembling sensation    Family History  Problem Relation Age of Onset   Cancer Father    Cancer Sister        throat   Cancer Brother        throat and stomach   Cancer Brother        lung    Prior to Admission medications   Medication Sig Start Date End Date Taking? Authorizing Provider  ALPRAZolam (XANAX) 0.25 MG tablet Take 0.5 tablets (0.125 mg total) by mouth 2 (two) times daily as needed for anxiety. 05/31/23   Antony Blackbird, MD  cetirizine (ZYRTEC) 10 MG tablet TAKE 1 TABLET BY MOUTH EVERY DAY 04/09/19   Hoy Register, MD  cyclobenzaprine (FLEXERIL) 10 MG tablet Take by mouth. 05/16/19   [provider]  dexamethasone (DECADRON) 4 MG tablet Take 1 tablet (4 mg total) by mouth 2 (two) times daily with a meal. 04/13/23   Antony Blackbird, MD  fluticasone (FLONASE) 50  MCG/ACT nasal spray INSTILL 1 SPRAY INTO EACH NOSTRIL DAILY 04/09/19   Hoy Register, MD  levocetirizine (XYZAL) 5 MG tablet Take 5 mg by mouth at bedtime. Patient not taking: Reported on 04/13/2023 11/21/19   [provider]  lidocaine (LIDODERM) 5 % Place 1 patch onto the skin daily. Remove & Discard patch within 12 hours or as directed by MD 08/17/18   Marcine Matar, MD  lidocaine-prilocaine (EMLA) cream Apply to affected area as directed. 05/31/23   [name], MD  lisinopril-hydrochlorothiazide (ZESTORETIC) 20-25 MG tablet Take 1 tablet by mouth daily. 12/31/18   Hoy Register, MD  methadone (DOLOPHINE) 10 MG tablet Take 4 tablets (40 mg total) by mouth every 12 (twelve) hours. 05/16/23   [name], MD  nitrofurantoin, macrocrystal-monohydrate, (MACROBID) 100 MG capsule Take 1 capsule (100 mg total) by mouth 2 (two) times daily. 06/02/23   Erven Colla, PA-C  omeprazole (PRILOSEC) 20 MG capsule Take 1 capsule (20 mg total) by mouth daily. Must have office visit for refills 05/02/19   Hoy Register, MD  ondansetron (ZOFRAN) 8 MG tablet Take 1 tablet (8 mg total) by mouth every 8 (eight) hours as needed for nausea or vomiting. Start on the third day after chemotherapy. 05/31/23   [name], MD  oxycodone (ROXICODONE) 30 MG immediate release tablet Take 1 tablet (30 mg total) by mouth every 4 (four) hours as needed. 05/31/23   [name], MD  phenazopyridine (PYRIDIUM) 100 MG tablet Take 1 tablet (100 mg total) by mouth 3 (three) times daily as needed for pain. 06/01/23   Erven Colla, PA-C  prochlorperazine (COMPAZINE) 10 MG tablet Take 1 tablet (10 mg total) by mouth every 6 (six) hours as needed for nausea. 11/09/20   Antony Blackbird, MD  prochlorperazine (COMPAZINE) 10 MG tablet Take 1 tablet (10 mg total) by mouth every 6 (six) hours as needed for nausea or vomiting. 05/31/23   [name], MD  Vitamin D, Ergocalciferol, (DRISDOL) 50000 units CAPS capsule Take 1 capsule (50,000 Units total)  by mouth every 7 (seven) days. Patient taking differently: Take 50,000 Units by mouth every Saturday. 10/26/17   Anders Simmonds, PA-C  VOLTAREN 1 % GEL Apply 4 g topically 4 (four) times daily. 03/31/17   Hoy Register, MD     Physical Exam: Vitals:   06/08/23 1645 06/08/23 1730 06/08/23 1835 06/08/23 1916  BP: 120/73 121/71 123/68 122/66  Pulse: 98 93  96 93  Resp:    17  Temp:    98.5 F (36.9 C)  TempSrc:    Oral  SpO2: 94% 93% 93% 97%  Weight:      Height:        Physical Exam Constitutional:      Appearance: She is ill-appearing.  HENT:     Mouth/Throat:     Mouth: Mucous membranes are moist.  Eyes:     Conjunctiva/sclera: Conjunctivae normal.  Cardiovascular:     Rate and Rhythm: Normal rate and regular rhythm.     Pulses: Normal pulses.     Heart sounds: Normal heart sounds.  Abdominal:     General: Bowel sounds are normal.  Musculoskeletal:        General: Tenderness present. No swelling, deformity or signs of injury.     Cervical back: Neck supple.     Right lower leg: No edema.     Left lower leg: No edema.  Skin:    General: Skin is warm.     Capillary Refill: Capillary refill takes less than 2 seconds.  Neurological:     Mental Status: She is alert and oriented to person, place, and time.  Psychiatric:        Mood and Affect: Mood normal.        Behavior: Behavior normal.        Thought Content: Thought content normal.      Labs on Admission: I have personally reviewed following labs and imaging studies  CBC: Recent Labs  Lab 06/08/23 1555  WBC 8.3  HGB 10.5*  HCT 34.0*  MCV 89.5  PLT 296   Basic Metabolic Panel: Recent Labs  Lab 06/08/23 1555  NA 137  K 3.7  CL 99  CO2 29  GLUCOSE 127*  BUN 17  CREATININE 0.74  CALCIUM 8.9   GFR: Estimated Creatinine Clearance: 65.2 mL/min (by C-G formula based on SCr of 0.74 mg/dL). Liver Function Tests: No results for input(s): "AST", "ALT", "ALKPHOS", "BILITOT", "PROT", "ALBUMIN" in the  last 168 hours. No results for input(s): "LIPASE", "AMYLASE" in the last 168 hours. No results for input(s): "AMMONIA" in the last 168 hours. Coagulation Profile: No results for input(s): "INR", "PROTIME" in the last 168 hours. Cardiac Enzymes: No results for input(s): "CKTOTAL", "CKMB", "CKMBINDEX", "TROPONINI", "TROPONINIHS" in the last 168 hours. BNP (last 3 results) No results for input(s): "BNP" in the last 8760 hours. HbA1C: No results for input(s): "HGBA1C" in the last 72 hours. CBG: No results for input(s): "GLUCAP" in the last 168 hours. Lipid Profile: No results for input(s): "CHOL", "HDL", "LDLCALC", "TRIG", "CHOLHDL", "LDLDIRECT" in the last 72 hours. Thyroid Function Tests: No results for input(s): "TSH", "T4TOTAL", "FREET4", "T3FREE", "THYROIDAB" in the last 72 hours. Anemia Panel: No results for input(s): "VITAMINB12", "FOLATE", "FERRITIN", "TIBC", "IRON", "RETICCTPCT" in the last 72 hours. Urine analysis:    Component Value Date/Time   COLORURINE YELLOW 06/01/2023 1453   APPEARANCEUR CLOUDY (A) 06/01/2023 1453   LABSPEC 1.013 06/01/2023 1453   LABSPEC 1.010 08/26/2016 1131   PHURINE 6.0 06/01/2023 1453   GLUCOSEU NEGATIVE 06/01/2023 1453   GLUCOSEU Negative 08/26/2016 1131   HGBUR MODERATE (A) 06/01/2023 1453   BILIRUBINUR NEGATIVE 06/01/2023 1453   BILIRUBINUR Negative 08/26/2016 1131   KETONESUR NEGATIVE 06/01/2023 1453   PROTEINUR 100 (A) 06/01/2023 1453   UROBILINOGEN 0.2 08/26/2016 1131   NITRITE NEGATIVE 06/01/2023 1453   LEUKOCYTESUR LARGE (A) 06/01/2023 1453   LEUKOCYTESUR Trace 08/26/2016 1131  Radiological Exams on Admission: I have personally reviewed images CT ABDOMEN PELVIS W CONTRAST Result Date: 06/08/2023 CLINICAL DATA:  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 EXAM: CT ABDOMEN AND PELVIS WITH CONTRAST TECHNIQUE: Multidetector CT imaging of the abdomen and pelvis was performed using the standard protocol  following bolus administration of intravenous contrast. RADIATION DOSE REDUCTION: This exam was performed according to the departmental dose-optimization program which includes automated exposure control, adjustment of the mA and/or kV according to patient size and/or use of iterative reconstruction technique. CONTRAST:  OMNIPAQUE IOHEXOL 300 MG/ML  SOLN COMPARISON:  PET-CT 04/19/2023. Older examinations from outside institution. FINDINGS: Lower chest: Mild linear opacity lung bases likely scar or atelectasis. No pleural effusion. Hepatobiliary: Fatty liver infiltration. Patent portal vein. Gallbladder is nondilated. Pancreas: Unremarkable. No pancreatic ductal dilatation or surrounding inflammatory changes. Spleen: Normal in size without focal abnormality. Adrenals/Urinary Tract: Adrenal glands are preserved. No enhancing renal mass. 12 mm simple appearing left-sided renal cysts identified. Bosniak 1. Preserved contour to the urinary bladder. Punctate nonobstructing upper pole left-sided renal stone. Stomach/Bowel: There is debris in the stomach. Air-fluid level. Small bowel is normal course and caliber. Large bowel has a normal course and caliber with scattered stool. Normal appendix. Vascular/Lymphatic: Scattered vascular calcifications. Normal caliber aorta and IVC. Reproductive: Uterus and bilateral adnexa are unremarkable. Other: No free intra-abdominal air or free fluid. Musculoskeletal: Large destructive mass in the sacrum anteriorly with extension of soft tissue into the presacral space. This extends out to the sacroiliac joints bilaterally. There is also some potential involvement of the iliac bones and cells inferiorly on series 2, image 55. These areas were hypermetabolic on the PET-CT scan. Is also involvement up through the disc space at L5-S1. Subtle potential fracture of L5. On the right transverse process. Involvement of the tumor into the L5 vertebral body is also possible. Please correlate  with prior PET-CT. IMPRESSION: As seen on prior PET-CT there is a large destructive mass involving the sacrum with extension to the sacroiliac joints, the at the disc at L5-S1 and potentially involvement of L5. Associated pathologic fractures involving the sacrum and the right transverse process of L5. This extends into the presacral space. No developing lymph node enlargement seen in the abdomen and pelvis. Fatty liver infiltration. Please see separate PET-CT scan Electronically Signed   By: Karen Kays M.D.   On: 06/08/2023 21:19      Assessment/Plan: Principal Problem:   Cancer-related breakthrough pain Active Problems:   Cancer associated pain   Metastatic leiomyosarcoma of bone (HCC)   Chronic pain syndrome   Essential hypertension   GERD (gastroesophageal reflux disease)   Physical debility   Generalized anxiety disorder    Assessment and Plan: Cancer related breakthrough pain Cancer-related pain Metastatic leiomyosarcoma of the bone Chronic pain syndrome -Patient presented to emergency department complaining of acute on chronic breakthrough pain of the lower back area.  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  Patient is running out of methadone at home and taking oxycodone.  In the ED patient is hemodynamically stable.  Received multiple doses of Dilaudid without much improvement of the pain.   -CT abdomen pelvis showing large destructive mass involving the sacrum with extension to sacroiliac joint, L5-S1.  Associated pathological fracture of the transverse process. -Admitting for management of cancer-related breakthrough pain.  -Starting methadone 40 mg 3 times daily, oxycodone  30 mg every 6 hours as needed for breakthrough pain.  Continue Narcan as needed for respiratory depression. -Patient declining to take Decadron as it has been  worsened the lower back pain. - CBC and BMP grossly unremarkable. - Starting oral iron and vitamin D supplement. - Patient follows oncologist Dr. Ezra Sites, please consult oncology in the daytime for evaluation while patient in the hospital. -Continue cardiac monitoring, check pulse ox and supplemental oxygen as needed. - Consulted palliative care to discuss further goal of care, symptom management and pain management. -On discharge patient need to refer to outpatient pain clinic.   Essential hypertension -Blood pressure currently in good range.  At home patient is not taking any blood pressure regimen anymore. -Continue to monitor and treat as needed.  GERD -Continue Protonix.  Physical debility -Chronic physical debility secondary to advanced metastatic leiomyosarcoma.  Continue fall precaution.  Generalized anxiety disorder -Continue Xanax as needed.   DVT prophylaxis:  Lovenox Code Status:  Full Code Diet: Heart healthy carb modified diet Family Communication:   Family was present at bedside, at the time of interview. Opportunity was given to ask question and all questions were answered satisfactorily.  Disposition Plan: Continue monitor improvement of the pain.  Please reach out to oncology in the daytime. Consults: Oncology and palliative care. Admission status:   Observation, Telemetry bed  Severity of Illness: The appropriate patient status for this patient is OBSERVATION. Observation status is judged to be reasonable and necessary in order to provide the required intensity of service to ensure the patient's safety. The patient's presenting symptoms, physical exam findings, and initial radiographic and laboratory data in the context of their medical condition is felt to place them at decreased risk for further clinical deterioration. Furthermore, it is anticipated that the patient will be medically stable for discharge from the hospital within 2 midnights of admission.      Tereasa Coop, MD [facility]  How to contact the [facility] Attending or Consulting provider 7A - 7P or covering provider during after hours 7P -7A, for this patient.  Check the care team in [facility] and look for a) attending/consulting [facility] provider listed and b) the [facility] team listed Log into [email or web address] and use [place]'s universal password to access. If you do not have the password, please contact the hospital operator. Locate the [facility] provider you are looking for under [facility] and page to a number that you can be directly reached. If you still have difficulty reaching the provider, please page the [facility] (Director on Call) for the Hospitalists listed on amion for assistance.  06/08/2023, 10:53 PM

## 2023-06-08 NOTE — ED Provider Notes (Signed)
  EMERGENCY DEPARTMENT AT [facility] Provider Note   CSN: [id number] Arrival date & time: 06/08/23  1541     History  Chief Complaint  Patient presents with   Back Pain    Valerie Kerr is a 63 y.o. female.   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  Patient's most recent PET scan in January of this year showed a hypermetabolic mass measuring 8 x 5 cm in the sacral region.  Patient also has evidence of pathologic spinal fractures.  It also showed a chronic fracture of the right pubis without hypermetabolic activity without evidence of bony union.  Patient has had poorly controlled back pain.  Notes from her oncology appointment earlier this month indicate persistent pain with patient not complying with all of her medical treatment.  Patient had her oxycodone medication increased.  Patient was supposed to follow-up with pain management but she did not go to her appointment because of her pain.  Patient is post to be on methadone but ran out of that prescription recently.  Patient states she has not been able to move or get out of the bed in the last few days.  She denies any fevers or chills.  No focal numbness or weakness.  The pain is severe in her lower back area.  It does move towards the left side in her hip area no fevers or chills.  No vomiting or diarrhea.  Home Medications Prior to Admission medications   Medication Sig Start Date End Date Taking? Authorizing Provider  ALPRAZolam (XANAX) 0.25 MG tablet Take 0.5 tablets (0.125 mg total) by mouth 2 (two) times daily as needed for anxiety. 05/31/23   Antony Blackbird, MD  cetirizine (ZYRTEC) 10 MG tablet TAKE 1 TABLET BY MOUTH EVERY DAY 04/09/19   Hoy Register, MD  cyclobenzaprine (FLEXERIL) 10 MG tablet Take by mouth. 05/16/19    [provider]  dexamethasone (DECADRON) 4 MG tablet Take 1 tablet (4 mg total) by mouth 2 (two) times daily with a meal. 04/13/23   Antony Blackbird, MD  fluticasone (FLONASE) 50 MCG/ACT nasal spray INSTILL 1 SPRAY INTO EACH NOSTRIL DAILY 04/09/19   Hoy Register, MD  levocetirizine (XYZAL) 5 MG tablet Take 5 mg by mouth at bedtime. Patient not taking: Reported on 04/13/2023 11/21/19   [provider]  lidocaine (LIDODERM) 5 % Place 1 patch onto the skin daily. Remove & Discard patch within 12 hours or as directed by MD 08/17/18   Marcine Matar, MD  lidocaine-prilocaine (EMLA) cream Apply to affected area as directed. 05/31/23   [name], MD  lisinopril-hydrochlorothiazide (ZESTORETIC) 20-25 MG tablet Take 1 tablet by mouth daily. 12/31/18   Hoy Register, MD  methadone (DOLOPHINE) 10 MG tablet Take 4 tablets (40 mg total) by mouth every 12 (twelve) hours. 05/16/23   [name], MD  nitrofurantoin, macrocrystal-monohydrate, (MACROBID) 100 MG capsule Take 1 capsule (100 mg total) by mouth 2 (two) times daily. 06/02/23   Erven Colla, PA-C  omeprazole (PRILOSEC) 20 MG capsule Take 1 capsule (20 mg total) by mouth daily. Must have office visit for refills 05/02/19   Hoy Register, MD  ondansetron (ZOFRAN) 8 MG tablet Take 1 tablet (8 mg total) by mouth every 8 (eight) hours as needed for nausea or vomiting. Start on the third day after  chemotherapy. 05/31/23   [name], MD  oxycodone (ROXICODONE) 30 MG immediate release tablet Take 1 tablet (30 mg total) by mouth every 4 (four) hours as needed. 05/31/23   [name], MD  phenazopyridine (PYRIDIUM) 100 MG tablet Take 1 tablet (100 mg total) by mouth 3 (three) times daily as needed for pain. 06/01/23   Erven Colla, PA-C  prochlorperazine (COMPAZINE) 10 MG tablet Take 1 tablet (10 mg total) by mouth every 6 (six) hours as needed for nausea. 11/09/20   Antony Blackbird, MD  prochlorperazine (COMPAZINE) 10 MG tablet Take 1 tablet (10 mg total)  by mouth every 6 (six) hours as needed for nausea or vomiting. 05/31/23   [name], MD  Vitamin D, Ergocalciferol, (DRISDOL) 50000 units CAPS capsule Take 1 capsule (50,000 Units total) by mouth every 7 (seven) days. Patient taking differently: Take 50,000 Units by mouth every Saturday. 10/26/17   Anders Simmonds, PA-C  VOLTAREN 1 % GEL Apply 4 g topically 4 (four) times daily. 03/31/17   Hoy Register, MD      Allergies    Codeine, Fentanyl, Hydrocodone-acetaminophen, and Tramadol    Review of Systems   Review of Systems  Musculoskeletal:  Positive for back pain.    Physical Exam Updated Vital Signs BP 122/66 (BP Location: Left Arm)   Pulse 93   Temp 98.5 F (36.9 C) (Oral)   Resp 17   Ht 1.562 m (5' 1.5")   Wt 71.7 kg   SpO2 97%   BMI 29.37 kg/m  Physical Exam Vitals and nursing note reviewed.  Constitutional:      Appearance: She is well-developed. She is not diaphoretic.  HENT:     Head: Normocephalic and atraumatic.     Right Ear: External ear normal.     Left Ear: External ear normal.  Eyes:     General: No scleral icterus.       Right eye: No discharge.        Left eye: No discharge.     Conjunctiva/sclera: Conjunctivae normal.  Neck:     Trachea: No tracheal deviation.  Cardiovascular:     Rate and Rhythm: Normal rate and regular rhythm.  Pulmonary:     Effort: Pulmonary effort is normal. No respiratory distress.     Breath sounds: Normal breath sounds. No stridor. No wheezing or rales.  Abdominal:     General: Bowel sounds are normal. There is no distension.     Palpations: Abdomen is soft.     Tenderness: There is no abdominal tenderness. There is no guarding or rebound.  Musculoskeletal:        General: Tenderness present. No deformity.     Cervical back: Neck supple.     Right lower leg: No edema.     Left lower leg: No edema.     Comments: Tenderness palpation in the sacral region, pain with range of motion of her lower extremities,  Skin:     General: Skin is warm and dry.     Findings: No rash.  Neurological:     General: No focal deficit present.     Mental Status: She is alert.     Cranial Nerves: No cranial nerve deficit, dysarthria or facial asymmetry.     Sensory: No sensory deficit.     Motor: No abnormal muscle tone or seizure activity.     Coordination: Coordination normal.  Psychiatric:        Mood and Affect: Mood normal.  ED Results / Procedures / Treatments   Labs (all labs ordered are listed, but only abnormal results are displayed) Labs Reviewed  CBC - Abnormal; Notable for the following components:      Result Value   RBC 3.80 (*)    Hemoglobin 10.5 (*)    HCT 34.0 (*)    All other components within normal limits  BASIC METABOLIC PANEL - Abnormal; Notable for the following components:   Glucose, Bld 127 (*)    All other components within normal limits  HIV ANTIBODY (ROUTINE TESTING W REFLEX)  CBC  COMPREHENSIVE METABOLIC PANEL    EKG None  Radiology CT ABDOMEN PELVIS W CONTRAST Result Date: 06/08/2023 CLINICAL DATA:  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 EXAM: CT ABDOMEN AND PELVIS WITH CONTRAST TECHNIQUE: Multidetector CT imaging of the abdomen and pelvis was performed using the standard protocol following bolus administration of intravenous contrast. RADIATION DOSE REDUCTION: This exam was performed according to the departmental dose-optimization program which includes automated exposure control, adjustment of the mA and/or kV according to patient size and/or use of iterative reconstruction technique. CONTRAST:  OMNIPAQUE IOHEXOL 300 MG/ML  SOLN COMPARISON:  PET-CT 04/19/2023. Older examinations from outside institution. FINDINGS: Lower chest: Mild linear opacity lung bases likely scar or atelectasis. No pleural effusion. Hepatobiliary: Fatty liver infiltration. Patent portal vein. Gallbladder is nondilated. Pancreas: Unremarkable. No pancreatic ductal  dilatation or surrounding inflammatory changes. Spleen: Normal in size without focal abnormality. Adrenals/Urinary Tract: Adrenal glands are preserved. No enhancing renal mass. 12 mm simple appearing left-sided renal cysts identified. Bosniak 1. Preserved contour to the urinary bladder. Punctate nonobstructing upper pole left-sided renal stone. Stomach/Bowel: There is debris in the stomach. Air-fluid level. Small bowel is normal course and caliber. Large bowel has a normal course and caliber with scattered stool. Normal appendix. Vascular/Lymphatic: Scattered vascular calcifications. Normal caliber aorta and IVC. Reproductive: Uterus and bilateral adnexa are unremarkable. Other: No free intra-abdominal air or free fluid. Musculoskeletal: Large destructive mass in the sacrum anteriorly with extension of soft tissue into the presacral space. This extends out to the sacroiliac joints bilaterally. There is also some potential involvement of the iliac bones and cells inferiorly on series 2, image 55. These areas were hypermetabolic on the PET-CT scan. Is also involvement up through the disc space at L5-S1. Subtle potential fracture of L5. On the right transverse process. Involvement of the tumor into the L5 vertebral body is also possible. Please correlate with prior PET-CT. IMPRESSION: As seen on prior PET-CT there is a large destructive mass involving the sacrum with extension to the sacroiliac joints, the at the disc at L5-S1 and potentially involvement of L5. Associated pathologic fractures involving the sacrum and the right transverse process of L5. This extends into the presacral space. No developing lymph node enlargement seen in the abdomen and pelvis. Fatty liver infiltration. Please see separate PET-CT scan Electronically Signed   By: Karen Kays M.D.   On: 06/08/2023 21:19    Procedures .Critical Care  Performed by: Linwood Dibbles, MD Authorized by: Linwood Dibbles, MD   Critical care provider statement:     Critical care time (minutes):  30   Critical care was time spent personally by me on the following activities:  Development of treatment plan with patient or surrogate, discussions with consultants, evaluation of patient's response to treatment, examination of patient, ordering and review of laboratory studies, ordering and review of radiographic studies, ordering and performing treatments and interventions, pulse oximetry,  re-evaluation of patient's condition and review of old charts     Medications Ordered in ED Medications  oxyCODONE (Oxy IR/ROXICODONE) immediate release tablet 30 mg (30 mg Oral Given 06/08/23 2328)  ALPRAZolam (XANAX) tablet 0.125 mg (0.125 mg Oral Given 06/08/23 2327)  prochlorperazine (COMPAZINE) tablet 10 mg (has no administration in time range)  pantoprazole (PROTONIX) EC tablet 40 mg (40 mg Oral Given 06/08/23 2336)  enoxaparin (LOVENOX) injection 40 mg (has no administration in time range)  sodium chloride flush (NS) 0.9 % injection 3 mL (3 mLs Intravenous Given 06/08/23 2330)  sodium chloride flush (NS) 0.9 % injection 3 mL (3 mLs Intravenous Given 06/08/23 2330)  sodium chloride flush (NS) 0.9 % injection 3 mL (has no administration in time range)  0.9 %  sodium chloride infusion (has no administration in time range)  ibuprofen (ADVIL) tablet 400 mg (has no administration in time range)  docusate sodium (COLACE) capsule 100 mg (100 mg Oral Given 06/08/23 2330)  bisacodyl (DULCOLAX) EC tablet 5 mg (has no administration in time range)  naloxone (NARCAN) injection 0.4 mg (has no administration in time range)  calcium-vitamin D (OSCAL WITH D) 500-5 MG-MCG per tablet 1 tablet (1 tablet Oral Given 06/08/23 2330)  methadone (DOLOPHINE) tablet 40 mg (has no administration in time range)  feeding supplement (ENSURE ENLIVE / ENSURE PLUS) liquid 237 mL (has no administration in time range)  pneumococcal 20-valent conjugate vaccine (PREVNAR 20) injection 0.5 mL (has no  administration in time range)  influenza vac split trivalent PF (FLULAVAL) injection 0.5 mL (has no administration in time range)  HYDROmorphone (DILAUDID) injection 1 mg (1 mg Intravenous Given 06/08/23 1728)  iohexol (OMNIPAQUE) 300 MG/ML solution 100 mL (100 mLs Intravenous Contrast Given 06/08/23 1747)  HYDROmorphone (DILAUDID) injection 2 mg (2 mg Intravenous Given 06/08/23 1837)  HYDROmorphone (DILAUDID) injection 1 mg (1 mg Intravenous Given 06/08/23 2134)  sodium chloride 0.9 % bolus 500 mL (500 mLs Intravenous New Bag/Given 06/08/23 2327)    ED Course/ Medical Decision Making/ A&P Clinical Course as of 06/08/23 2343  Thu Jun 08, 2023  1606 Oxycodone rx on 3/5 [JK]  2129 CT scan shows large destructive mass in the sacrum extending to the sacroiliac joints.  There is potential involvement of L5.  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 Radiology: ordered.  Risk Prescription drug management. Decision regarding hospitalization.   Patient presented to the ER for evaluation of severe pain in her back.  Patient is now having difficulty ambulating.  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  Patient has been having worsening pain and has not been able to  attend her recent radiation treatments or her pain management  treatment.  Patient without signs of infection.  She has remained hemodynamically stable but continues to have severe pain.  She has required multiple doses of opiate pain medications.  CT scan was performed and it shows findings associated with her known leiomyosarcoma.  Will consult the medical service for admission.  Oncology will see pt in the am        Final Clinical Impression(s) / ED Diagnoses Final diagnoses:  Sacral mass  Leiomyosarcoma (HCC)  Cancer associated pain    Rx / DC Orders ED Discharge Orders     None         Linwood Dibbles, MD 06/08/23 2344

## 2023-06-08 NOTE — ED Triage Notes (Signed)
 Pt bib ems from home for chronic lower back, buttocks pain that radiates down legs.  Stage four pain medicine.  Takes methadone she is out and took oxycodone his morning.

## 2023-06-09 ENCOUNTER — Other Ambulatory Visit: Payer: Self-pay

## 2023-06-09 ENCOUNTER — Ambulatory Visit: Payer: Medicare Other

## 2023-06-09 ENCOUNTER — Ambulatory Visit
Admission: RE | Admit: 2023-06-09 | Discharge: 2023-06-09 | Disposition: A | Discharge status: Home / Self Care | Source: Ambulatory Visit | Attending: Radiation Oncology | Admitting: Radiation Oncology

## 2023-06-09 DIAGNOSIS — Z923 Personal history of irradiation: Secondary | ICD-10-CM | POA: Diagnosis not present

## 2023-06-09 DIAGNOSIS — C419 Malignant neoplasm of bone and articular cartilage, unspecified: Secondary | ICD-10-CM | POA: Diagnosis not present

## 2023-06-09 DIAGNOSIS — Z79899 Other long term (current) drug therapy: Secondary | ICD-10-CM

## 2023-06-09 DIAGNOSIS — K760 Fatty (change of) liver, not elsewhere classified: Secondary | ICD-10-CM | POA: Diagnosis present

## 2023-06-09 DIAGNOSIS — T402X5A Adverse effect of other opioids, initial encounter: Secondary | ICD-10-CM | POA: Diagnosis present

## 2023-06-09 DIAGNOSIS — Z801 Family history of malignant neoplasm of trachea, bronchus and lung: Secondary | ICD-10-CM | POA: Diagnosis not present

## 2023-06-09 DIAGNOSIS — Z91148 Patient's other noncompliance with medication regimen for other reason: Secondary | ICD-10-CM | POA: Diagnosis not present

## 2023-06-09 DIAGNOSIS — Z9221 Personal history of antineoplastic chemotherapy: Secondary | ICD-10-CM | POA: Diagnosis not present

## 2023-06-09 DIAGNOSIS — G893 Neoplasm related pain (acute) (chronic): Secondary | ICD-10-CM

## 2023-06-09 DIAGNOSIS — G894 Chronic pain syndrome: Secondary | ICD-10-CM | POA: Diagnosis present

## 2023-06-09 DIAGNOSIS — Z885 Allergy status to narcotic agent status: Secondary | ICD-10-CM | POA: Diagnosis not present

## 2023-06-09 DIAGNOSIS — F411 Generalized anxiety disorder: Secondary | ICD-10-CM | POA: Diagnosis present

## 2023-06-09 DIAGNOSIS — Z515 Encounter for palliative care: Secondary | ICD-10-CM | POA: Diagnosis not present

## 2023-06-09 DIAGNOSIS — K5903 Drug induced constipation: Secondary | ICD-10-CM | POA: Diagnosis present

## 2023-06-09 DIAGNOSIS — Z800 Family history of malignant neoplasm of digestive organs: Secondary | ICD-10-CM | POA: Diagnosis not present

## 2023-06-09 DIAGNOSIS — C495 Malignant neoplasm of connective and soft tissue of pelvis: Secondary | ICD-10-CM | POA: Diagnosis not present

## 2023-06-09 DIAGNOSIS — R300 Dysuria: Secondary | ICD-10-CM | POA: Diagnosis present

## 2023-06-09 DIAGNOSIS — Z91199 Patient's noncompliance with other medical treatment and regimen due to unspecified reason: Secondary | ICD-10-CM | POA: Diagnosis not present

## 2023-06-09 DIAGNOSIS — M8448XA Pathological fracture, other site, initial encounter for fracture: Secondary | ICD-10-CM | POA: Diagnosis present

## 2023-06-09 DIAGNOSIS — C7951 Secondary malignant neoplasm of bone: Secondary | ICD-10-CM | POA: Diagnosis not present

## 2023-06-09 DIAGNOSIS — C499 Malignant neoplasm of connective and soft tissue, unspecified: Secondary | ICD-10-CM | POA: Diagnosis not present

## 2023-06-09 DIAGNOSIS — R5381 Other malaise: Secondary | ICD-10-CM | POA: Diagnosis present

## 2023-06-09 DIAGNOSIS — F17210 Nicotine dependence, cigarettes, uncomplicated: Secondary | ICD-10-CM | POA: Diagnosis present

## 2023-06-09 DIAGNOSIS — Z8541 Personal history of malignant neoplasm of cervix uteri: Secondary | ICD-10-CM | POA: Diagnosis not present

## 2023-06-09 DIAGNOSIS — K219 Gastro-esophageal reflux disease without esophagitis: Secondary | ICD-10-CM | POA: Diagnosis present

## 2023-06-09 DIAGNOSIS — Z7189 Other specified counseling: Secondary | ICD-10-CM

## 2023-06-09 DIAGNOSIS — Z23 Encounter for immunization: Secondary | ICD-10-CM | POA: Diagnosis not present

## 2023-06-09 DIAGNOSIS — I10 Essential (primary) hypertension: Secondary | ICD-10-CM | POA: Diagnosis present

## 2023-06-09 DIAGNOSIS — Z808 Family history of malignant neoplasm of other organs or systems: Secondary | ICD-10-CM | POA: Diagnosis not present

## 2023-06-09 LAB — RAD ONC ARIA SESSION SUMMARY
Course Elapsed Days: 22
Plan Fractions Treated to Date: 10
Plan Prescribed Dose Per Fraction: 2.5 Gy
Plan Total Fractions Prescribed: 15
Plan Total Prescribed Dose: 37.5 Gy
Reference Point Dosage Given to Date: 25 Gy
Reference Point Session Dosage Given: 2.5 Gy
Session Number: 10

## 2023-06-09 LAB — CBC
HCT: 36.5 % (ref 36.0–46.0)
Hemoglobin: 10.8 g/dL — ABNORMAL LOW (ref 12.0–15.0)
MCH: 27 pg (ref 26.0–34.0)
MCHC: 29.6 g/dL — ABNORMAL LOW (ref 30.0–36.0)
MCV: 91.3 fL (ref 80.0–100.0)
Platelets: 291 10*3/uL (ref 150–400)
RBC: 4 MIL/uL (ref 3.87–5.11)
RDW: 13.6 % (ref 11.5–15.5)
WBC: 8.6 10*3/uL (ref 4.0–10.5)
nRBC: 0 % (ref 0.0–0.2)

## 2023-06-09 LAB — COMPREHENSIVE METABOLIC PANEL
ALT: 25 U/L (ref 0–44)
AST: 18 U/L (ref 15–41)
Albumin: 2.7 g/dL — ABNORMAL LOW (ref 3.5–5.0)
Alkaline Phosphatase: 55 U/L (ref 38–126)
Anion gap: 11 (ref 5–15)
BUN: 16 mg/dL (ref 8–23)
CO2: 29 mmol/L (ref 22–32)
Calcium: 9.2 mg/dL (ref 8.9–10.3)
Chloride: 97 mmol/L — ABNORMAL LOW (ref 98–111)
Creatinine, Ser: 0.89 mg/dL (ref 0.44–1.00)
GFR, Estimated: >60 mL/min (ref >60)
Glucose, Bld: 84 mg/dL (ref 70–99)
Potassium: 3.9 mmol/L (ref 3.5–5.1)
Sodium: 137 mmol/L (ref 135–145)
Total Bilirubin: 0.8 mg/dL (ref 0.0–1.2)
Total Protein: 6.9 g/dL (ref 6.5–8.1)

## 2023-06-09 LAB — GLUCOSE, CAPILLARY: Glucose-Capillary: 80 mg/dL (ref 70–99)

## 2023-06-09 LAB — HIV ANTIBODY (ROUTINE TESTING W REFLEX): HIV Screen 4th Generation wRfx: NONREACTIVE

## 2023-06-09 MED ORDER — INFLUENZA VIRUS VACC SPLIT PF (FLUZONE) 0.5 ML IM SUSY: 0.5000 mL | PREFILLED_SYRINGE | INTRAMUSCULAR | Status: DC | PRN

## 2023-06-09 MED ORDER — OXYCODONE HCL 5 MG PO TABS
30.0000 mg | ORAL_TABLET | Freq: Four times a day (QID) | ORAL | Status: DC
  Administered 2023-06-09: 30 mg via ORAL
  Filled 2023-06-09: qty 6

## 2023-06-09 MED ORDER — SENNA 8.6 MG PO TABS: 2.0000 | ORAL_TABLET | Freq: Every evening | ORAL | Status: DC | PRN

## 2023-06-09 MED ORDER — OXYCODONE HCL 5 MG PO TABS
30.0000 mg | ORAL_TABLET | ORAL | Status: DC | PRN
  Administered 2023-06-09 – 2023-06-19 (×30): 30 mg via ORAL
  Filled 2023-06-09 (×32): qty 6

## 2023-06-09 MED ORDER — METHADONE HCL 10 MG PO TABS
70.0000 mg | ORAL_TABLET | Freq: Every day | ORAL | Status: DC
  Administered 2023-06-11 – 2023-06-19 (×9): 70 mg via ORAL
  Filled 2023-06-09 (×10): qty 7

## 2023-06-09 MED ORDER — HYDROMORPHONE HCL 1 MG/ML IJ SOLN
1.0000 mg | INTRAMUSCULAR | Status: DC | PRN
  Administered 2023-06-09 – 2023-06-10 (×3): 2 mg via INTRAVENOUS
  Filled 2023-06-09 (×3): qty 2

## 2023-06-09 MED ORDER — METHADONE HCL 10 MG PO TABS
40.0000 mg | ORAL_TABLET | Freq: Every day | ORAL | Status: DC
  Administered 2023-06-10 – 2023-06-19 (×10): 40 mg via ORAL
  Filled 2023-06-09 (×9): qty 4

## 2023-06-09 MED ORDER — FLUTICASONE PROPIONATE 50 MCG/ACT NA SUSP
1.0000 | Freq: Every day | NASAL | Status: DC
  Administered 2023-06-09 – 2023-06-19 (×11): 1 via NASAL
  Filled 2023-06-09: qty 16

## 2023-06-09 MED ORDER — PNEUMOCOCCAL 20-VAL CONJ VACC 0.5 ML IM SUSY: 0.5000 mL | PREFILLED_SYRINGE | INTRAMUSCULAR | Status: DC | PRN

## 2023-06-09 MED ORDER — HYDROMORPHONE HCL 1 MG/ML IJ SOLN: 0.5000 mg | INTRAMUSCULAR | Status: DC | PRN

## 2023-06-09 MED ORDER — HYDROMORPHONE HCL 1 MG/ML IJ SOLN
0.5000 mg | INTRAMUSCULAR | Status: AC | PRN
  Administered 2023-06-09 (×2): 0.5 mg via INTRAVENOUS
  Filled 2023-06-09 (×2): qty 0.5

## 2023-06-09 MED ORDER — CYCLOBENZAPRINE HCL 10 MG PO TABS
10.0000 mg | ORAL_TABLET | Freq: Three times a day (TID) | ORAL | Status: DC | PRN
  Administered 2023-06-10 – 2023-06-16 (×3): 10 mg via ORAL
  Filled 2023-06-09 (×4): qty 1

## 2023-06-09 MED ORDER — METHADONE HCL 10 MG PO TABS
40.0000 mg | ORAL_TABLET | Freq: Every day | ORAL | Status: DC
  Administered 2023-06-09 – 2023-06-18 (×10): 40 mg via ORAL
  Filled 2023-06-09 (×11): qty 4

## 2023-06-09 NOTE — Evaluation (Signed)
 Physical Therapy Evaluation Patient Details Name: Valerie Kerr MRN: [id number] DOB: Jul 30, 1960 Today's Date: 06/09/2023  History of Present Illness  63 yo female presents to the hospital for uncontrolled pain. CT 3/13: As seen on prior PET-CT there is a large destructive mass involving  the sacrum with extension to the sacroiliac joints, the at the disc  at L5-S1 and potentially involvement of L5. Associated pathologic  fractures involving the sacrum and the right transverse process of  L5. This extends into the presacral space.  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  Clinical Impression  Pt admitted with above diagnosis. Pt from home with daughters, reports ind with rollator in the home, able to complete self care tasks, daughters completing household chores and present to assist her as needed. Eval limited due to high pain complaints, reporting stabbing pain in L buttock, only relief is when asleep, reports no improvement in pain with bil knee flexion, also R leg movement increased L leg/buttock pain. Pt able to mobilize RLE while in bed, self assisting LLE as needed. Recommend HHPT if pt agreeable, has all DME at home and 24/7 support from daughters at home. Pt currently with functional limitations due to the deficits listed below (see PT Problem List). Pt will benefit from acute skilled PT to increase their independence and safety with mobility to allow discharge.           If plan is discharge home, recommend the following: A little help with walking and/or transfers;A little help with bathing/dressing/bathroom;Assistance with cooking/housework;Assist for transportation   Can travel by private vehicle        Equipment Recommendations None recommended by PT  Recommendations for Other Services       Functional Status Assessment Patient has had a  recent decline in their functional status and demonstrates the ability to make significant improvements in function in a reasonable and predictable amount of time.     Precautions / Restrictions Precautions Precautions: Fall Restrictions Weight Bearing Restrictions Per Provider Order: No      Mobility  Bed Mobility Overal bed mobility: Needs Assistance             General bed mobility comments: pt able to move RLE freely in the bed though does increase L sided pain, self assisting LLE with BUE to reposition to comfort, declines sitting EOB due to high pain    Transfers                        Ambulation/Gait                  Stairs            Wheelchair Mobility     Tilt Bed    Modified Rankin (Stroke Patients Only)       Balance                                             Pertinent Vitals/Pain Pain Assessment Pain Assessment: 0-10 Pain Score: 10-Worst pain ever Pain Location: L buttock Pain Descriptors / Indicators: Stabbing, Grimacing, Guarding, Crying, Discomfort Pain Intervention(s): Limited activity within patient's tolerance, Monitored during session, Repositioned, Patient requesting pain meds-RN notified    Home Living Family/patient expects to be discharged to::  Private residence Living Arrangements: Children Available Help at Discharge: Family;Available 24 hours/day Type of Home: Apartment Home Access: Level entry       Home Layout: One level Home Equipment: Rollator (4 wheels);BSC/3in1;Hospital bed Additional Comments: pt reports moved in with daughter, 2nd daughter also present to assist as needed    Prior Function Prior Level of Function : Independent/Modified Independent;Needs assist             Mobility Comments: pt reports using rollator for in home ambulation ADLs Comments: pt reports ind with self care, daughter completing household chores     Extremity/Trunk Assessment         Lower Extremity Assessment Lower Extremity Assessment: RLE deficits/detail;LLE deficits/detail RLE Deficits / Details: ankle AROM WFL, knee ~0-120, able to perform SLR RLE Sensation: WNL RLE Coordination: WNL LLE Deficits / Details: ankle AROM WFL, knee AROM ~0-20, self assists SLR ~2 inches off of bed with pain LLE Sensation: WNL LLE Coordination: WNL       Communication   Communication Communication: No apparent difficulties    Cognition Arousal: Alert Behavior During Therapy: WFL for tasks assessed/performed   PT - Cognitive impairments: No apparent impairments                         Following commands: Intact       Cueing       General Comments      Exercises     Assessment/Plan    PT Assessment Patient needs continued PT services  PT Problem List Decreased strength;Decreased activity tolerance;Decreased balance;Decreased mobility;Decreased knowledge of use of DME;Decreased safety awareness;Pain       PT Treatment Interventions DME instruction;Gait training;Functional mobility training;Therapeutic activities;Therapeutic exercise;Balance training;Neuromuscular re-education;Patient/family education;Wheelchair mobility training;Modalities    PT Goals (Current goals can be found in the Care Plan section)  Acute Rehab PT Goals Patient Stated Goal: return home with daughters assisting PT Goal Formulation: With patient Time For Goal Achievement: 06/23/23 Potential to Achieve Goals: Good    Frequency Min 2X/week     Co-evaluation               AM-PAC PT "6 Clicks" Mobility  Outcome Measure Help needed turning from your back to your side while in a flat bed without using bedrails?: A Little Help needed moving from lying on your back to sitting on the side of a flat bed without using bedrails?: A Little Help needed moving to and from a bed to a chair (including a wheelchair)?: A Lot Help needed standing up from a chair using your arms (e.g.,  wheelchair or bedside chair)?: A Lot Help needed to walk in hospital room?: A Lot Help needed climbing 3-5 steps with a railing? : A Lot 6 Click Score: 14    End of Session Equipment Utilized During Treatment: Oxygen Activity Tolerance: Patient limited by pain Patient left: in bed;with call bell/phone within reach;with bed alarm set;with nursing/sitter in room Nurse Communication: Mobility status;Patient requests pain meds PT Visit Diagnosis: Other abnormalities of gait and mobility (R26.89);Muscle weakness (generalized) (M62.81);Pain Pain - Right/Left: Left Pain - part of body: Hip;Knee;Leg    Time: 1610-9604 PT Time Calculation (min) (ACUTE ONLY): 13 min   Charges:   PT Evaluation $PT Eval Moderate Complexity: 1 Mod   PT General Charges $$ ACUTE PT VISIT: 1 Visit          Tori Charlei Ramsaran PT, DPT 06/09/23, 2:26 PM

## 2023-06-09 NOTE — Consult Note (Signed)
 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  Imaging upon presentation (as compared to PET from 03/2023) showed as seen previously large destructive mass involving the sacrum with extension into the sacroiliac joints at the disc of L5-S1 and potentially involvement of L5.  Noted associated pathologic fractures involving the sacrum and right transverse process of L5 which extended into the sacral space.  Oncology consulted for recommendations.  Palliative medicine team consulted to assist with complex medical decision making and symptom management.  Extensive review of EMR prior to presenting to bedside.  As per oncology no documentation at office visit on 05/31/2023 patient is supposed to be taking methadone 70 mg liquid in the morning (prescribed by Valerie Kerr), 40 mg pill in the afternoon (prescribed by oncologist), and 40 mg pill in the evening (prescribed by oncologist).  Patient also receiving as needed oxycodone 30 mg every 4 hours as needed. Patient has been referred to palliative medicine clinic at [facility] for management though has missed/rescheduled two appointments. As per EMR review, patient ran out of her methadone prior to presentation due to pain and inability to go pick it up.  So reportedly patient has not taken her methadone in 2 days. Did not see EKG during this admission.   With patient receiving methadone, ordered EKG and personally reviewed.  QTc noted to be 445 on 06/09/2023.  Review of CMP showed creatinine to be 0.98, AST 18, ALT 25, and total bilirubin 0.8.  Personally reviewed CT imaging from 06/08/2023 showing destructive bony disease at level of sacrum.  Discussed care with hospitalist and oncologist for updates.  Presented to bedside to see patient in the afternoon.  Patient noted to be laying comfortably in bed.  No family present at bedside.  With permission, introduced myself and the role of the palliative medicine team.  Spent time learning about patient's progression of pain as able.  Patient describes her pain as starting in her left buttocks then radiating to her right buttocks and down her left and right legs.  Patient describes "pain" as "pain " on her left side and then describes pins-and-needles on her right side.  Discussed how there are varying types of pain.  Will note that patient's story was disjointed at times and so difficult to determine exact timing of worsening of pain.  Patient feels that her pain worsened when starting to take dexamethasone though she cannot tell me when she started this medication and how long she has been off it besides being here in the hospital.  Patient also cannot describe all medication dosing or names and how she was taking them at home.  Patient notes she was taking oxycodone, which was recently changed to 30 mg, though cannot tell me how often. Inquired about use of IV Dilaudid which patient has received since admission.  As per EMR review patient has received IV Dilaudid 0.5 mg x 2 doses and 1 mg x 3 doses  and 2 mg x 1 dose.  Patient is unsure if the IV Dilaudid helps her as she cannot describe the pain improvement with it.  Patient notes that she feels she "falls asleep".  Discussed idea of opioids is to manage pain and not cause oversedation.  Patient currently requesting dose of IV Dilaudid. Tried to inquire about  neuropathic agents patient previously used to assist with neuropathic pain.  When naming neuropathic agent such as gabapentin, pregabalin, and duloxetine, patient states that she cannot take any of those medications due to what she describes as "stomach discomfort". Patient is hopeful now that she is not receiving dexamethasone and that she is back on her methadone, pain will improve.  Also inquired about constipation management with patient being on chronic opioids.  Patient called her daughter, Valerie Kerr, at home who was able to show this provider picture of "vegetable laxative" which was Senokot.  Patient notes she takes 2 tabs daily if needed.  Patient normally has a bowel movement daily.  Patient cannot remember her last bowel movement though believes it was a couple days ago.  Discussed importance again of regular bowel movements while receiving opioids.  Patient noted that her oncologist had already presented to bedside.  Patient noted that she heard only plan at this time is pain management.  Patient stated no further care plans mentioned at this time.  Spent time answering questions as able.  Noted palliative medicine team continue to follow along with patient's medical journey. 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---  treatment concurrent chemo (week 6 on hold due to pancytopenia) and radiation therapy (external beam 04-12-2016 to 05-31-2016)  started high-dose rate brachytherapy 05-26-2016   Chemotherapy-induced thrombocytopenia    Chronic pain disorder    back,neck-- chronic  methadone   Environmental allergies    allergy to dust mites   GERD (gastroesophageal reflux disease)    Headache    Herniated disc, cervical    History of external beam radiation therapy 04/12/16-05/31/16   pelvis 45 Gy in 25 fractions, in  30 sessions, pelvis boost 9 Gy in 5 fractions   History of radiation therapy 06/20/16-07/04/16   tandem and ring applicator to cervix 28 Gy in 5 fractions   Hypertension    Hypokalemia    Hypomagnesemia    po supplement and IV replacement   Leukopenia due to antineoplastic chemotherapy (HCC)    Lumbar herniated disc    Pancytopenia due to chemotherapy (HCC)    Periodontitis    Social History   Socioeconomic History   Marital status: Divorced    Spouse name: Not on file   Number of children: 5   Years of education: Not on file   Highest education level: Not on file  Occupational History   Occupation: disabled  Tobacco Use   Smoking status: Some Days    Current packs/day: 0.20    Average packs/day: 0.2 packs/day for 45.0 years (9.0 ttl pk-yrs)    Types: Cigarettes   Smokeless tobacco: Never   Tobacco comments:    1 week since last cigarette  Vaping Use   Vaping status:  Never Used  Substance and Sexual Activity   Alcohol use: No   Drug use: No   Sexual activity: Not Currently  Other Topics Concern   Not on file  Social History Narrative   Lives with niece in a 2 story home.  Has 5 children.  On disability.  Formerly worked in Stage manager at [employer].  Education: 11th grade.    Social Drivers of Corporate investment banker Strain: Not on file  Food Insecurity: Food Insecurity Present (06/08/2023)   Hunger Vital Sign    Worried About Running Out of Food in the Last Year: Sometimes true    Ran Out of Food in the Last Year: Sometimes true  Transportation Needs: No Transportation Needs (06/08/2023)   PRAPARE - Administrator, Civil Service (Medical): No    Lack of Transportation (Non-Medical): No  Physical  Activity: Not on file  Stress: Not on file  Social Connections: Feeling Socially Integrated (04/19/2023)   Received [facility]   OASIS D0700: Social Isolation    Frequency of experiencing loneliness or isolation: Never   Family History  Problem Relation Age of Onset   Cancer Father    Cancer Sister        throat   Cancer Brother        throat and stomach   Cancer Brother        lung   Scheduled Meds:  calcium-vitamin D  1 tablet Oral BID WC   docusate sodium  100 mg Oral BID   enoxaparin (LOVENOX) injection  40 mg Subcutaneous Q24H   feeding supplement  237 mL Oral BID BM   lidocaine  2 patch Transdermal Q24H   methadone  40 mg Oral Q8H   pantoprazole  40 mg Oral Daily   sodium chloride flush  3 mL Intravenous Q12H   sodium chloride flush  3 mL Intravenous Q12H   Continuous Infusions:  sodium chloride     PRN Meds:.sodium chloride, ALPRAZolam, bisacodyl, HYDROmorphone (DILAUDID) injection, ibuprofen, influenza vac split trivalent PF, naLOXone (NARCAN)  injection, oxycodone, pneumococcal 20-valent conjugate vaccine, prochlorperazine, sodium chloride flush Allergies  Allergen Reactions   Codeine Itching and Rash   Fentanyl Rash    Skin rash, local swelling from patch   Hydrocodone-Acetaminophen Rash   Tramadol Other (See Comments)    GI upset,also trembling sensation   CBC:    Component Value Date/Time   WBC 8.6 06/09/2023 0607   HGB 10.8 (L) 06/09/2023 0607   HGB 12.3 02/10/2020 1416   HGB 10.9 (L) 08/15/2016 1614   HCT 36.5 06/09/2023 0607   HCT 34.1 (L) 08/15/2016 1614   PLT 291 06/09/2023 0607   PLT 265 02/10/2020 1416   PLT 196 08/15/2016 1614   MCV 91.3 06/09/2023 0607   MCV 93.2 08/15/2016 1614   NEUTROABS 5.5 02/10/2020 1416   NEUTROABS 2.3 08/15/2016 1614   LYMPHSABS 1.8 02/10/2020 1416   LYMPHSABS 0.8 (L) 08/15/2016 1614   MONOABS 0.5 02/10/2020 1416   MONOABS 0.3 08/15/2016 1614   EOSABS 0.1 02/10/2020 1416   EOSABS 0.1  08/15/2016 1614   BASOSABS 0.0 02/10/2020 1416   BASOSABS 0.0 08/15/2016 1614   Comprehensive Metabolic Panel:    Component Value Date/Time   NA 137 06/09/2023 0607   NA 143 08/15/2016 1614   K 3.9 06/09/2023 0607   K 4.3 08/15/2016 1614   CL 97 (L) 06/09/2023 0607   CO2 29 06/09/2023 0607   CO2 31 (H)  08/15/2016 1614   BUN 16 06/09/2023 0607   BUN 13.5 08/15/2016 1614   CREATININE 0.89 06/09/2023 0607   CREATININE 0.99 02/11/2020 1401   CREATININE 0.9 08/15/2016 1614   GLUCOSE 84 06/09/2023 0607   GLUCOSE 113 08/15/2016 1614   CALCIUM 9.2 06/09/2023 0607   CALCIUM 9.3 08/15/2016 1614   AST 18 06/09/2023 0607   AST 36 02/11/2020 1401   AST 18 06/29/2016 0921   ALT 25 06/09/2023 0607   ALT 34 02/11/2020 1401   ALT 12 06/29/2016 0921   ALKPHOS 55 06/09/2023 0607   ALKPHOS 76 06/29/2016 0921   BILITOT 0.8 06/09/2023 0607   BILITOT 0.4 02/11/2020 1401   BILITOT 0.46 06/29/2016 0921   PROT 6.9 06/09/2023 0607   PROT 7.2 06/29/2016 0921   ALBUMIN 2.7 (L) 06/09/2023 0607   ALBUMIN 3.7 06/29/2016 0921    Physical Exam: Vital Signs: BP 105/61 (BP Location: Left Arm)   Pulse 96   Temp 99.6 F (37.6 C) (Oral)   Resp 20   Ht 5' 1.5" (1.562 m)   Wt 72.4 kg   SpO2 96%   BMI 29.67 kg/m  SpO2: SpO2: 96 % O2 Device: O2 Device: Nasal Cannula O2 Flow Rate:   Intake/output summary:  Intake/Output Summary (Last 24 hours) at 06/09/2023 0756 Last data filed at 06/09/2023 0500 Gross per 24 hour  Intake --  Output 275 ml  Net -275 ml   LBM: Last BM Date : 06/07/23 Baseline Weight: Weight: 71.7 kg Most recent weight: Weight: 72.4 kg  General: NAD, alert, chronically ill-appearing Cardiovascular: RRR Respiratory: no increased work of breathing noted, not in respiratory distress Neuro: Alert and appropriately interacting though overall poor historian         Palliative Performance Scale: 30%               Additional Data Reviewed: Recent Labs    06/08/23 1555  06/09/23 0607  WBC 8.3 8.6  HGB 10.5* 10.8*  PLT 296 291  NA 137 137  BUN 17 16  CREATININE 0.74 0.89    Imaging: CT ABDOMEN PELVIS W CONTRAST CLINICAL DATA:  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  EXAM: CT ABDOMEN AND PELVIS WITH CONTRAST  TECHNIQUE: Multidetector CT imaging of the abdomen and pelvis was performed using the standard protocol following bolus administration of intravenous contrast.  RADIATION DOSE REDUCTION: This exam was performed according to the departmental dose-optimization program which includes automated exposure control, adjustment of the mA and/or kV according to patient size and/or use of iterative reconstruction technique.  CONTRAST:  OMNIPAQUE IOHEXOL 300 MG/ML  SOLN  COMPARISON:  PET-CT 04/19/2023. Older examinations from outside institution.  FINDINGS: Lower chest: Mild linear opacity lung bases likely scar or atelectasis. No pleural effusion.  Hepatobiliary: Fatty liver infiltration. Patent portal vein. Gallbladder is nondilated.  Pancreas: Unremarkable. No pancreatic ductal dilatation or surrounding inflammatory changes.  Spleen: Normal in size without focal abnormality.  Adrenals/Urinary Tract: Adrenal glands are preserved. No enhancing renal mass. 12 mm simple appearing left-sided renal cysts identified. Bosniak 1. Preserved contour to the urinary bladder. Punctate nonobstructing upper pole left-sided renal stone.  Stomach/Bowel: There is debris in the stomach. Air-fluid level. Small bowel is normal course and caliber. Large bowel has a normal course and caliber with scattered stool. Normal appendix.  Vascular/Lymphatic: Scattered vascular calcifications. Normal caliber aorta and IVC.  Reproductive: Uterus and bilateral adnexa are unremarkable.  Other: No free intra-abdominal air or free fluid.  Musculoskeletal: Large destructive mass in the sacrum anteriorly with extension of  soft tissue into the presacral space. This extends out to the sacroiliac joints bilaterally. There is also some potential involvement of the iliac bones and cells inferiorly on series 2, image 55. These areas were hypermetabolic on the PET-CT scan. Is also involvement up through the disc space at L5-S1. Subtle potential fracture of L5. On the right transverse process. Involvement of the tumor into the L5 vertebral body is also possible. Please correlate with prior PET-CT.  IMPRESSION: As seen on prior PET-CT there is a large destructive mass involving the sacrum with extension to the sacroiliac joints, the at the disc at L5-S1 and potentially involvement of L5. Associated pathologic fractures involving the sacrum and the right transverse process of L5. This extends into the presacral space.  No developing lymph node enlargement seen in the abdomen and pelvis.  Fatty liver infiltration.  Please see separate PET-CT scan  Electronically Signed   By: Karen Kays M.D.   On: 06/08/2023 21:19    I personally reviewed recent imaging.   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  Imaging upon presentation (as compared to PET from 03/2023) showed as seen previously large destructive mass involving the sacrum with extension into the sacroiliac joints at the disc of L5-S1 and potentially involvement of L5.  Noted associated pathologic fractures involving the sacrum and right transverse process of L5 which extended into the sacral space.  Oncology consulted for recommendations.   Palliative medicine team consulted to assist with complex medical decision making and symptom management.  # Complex medical decision making/goals of care  -Patient discussing goals for medical care with oncologist at this time.  Patient notes primary discussion with oncology revolved around pain control.  No other planning for care moving forward at this time mentioned.  Palliative medicine team will continue to follow along and engage in conversations as able and appropriate.  -  Code Status: Full Code   # Symptom management Patient is receiving these palliative interventions for symptom management with an intent to improve quality of life.   -Pain, severe in setting of stage IV leiomyosarcoma of the sacrum Patient has been missing her home doses and unsure if taking them appropriately.  Resuming patient's home medication doses at this time to determine appropriate baseline.   -Continue home dose of methadone 70 mg in a.m., 40 mg in afternoon, and 40 mg in the evening (every 8 hours apart). This dosing was confirmed with Dr. Bertis Ruddy to be patient's home dose; patient receiving total of 150 mg of methadone in a day    -Personally ordered and reviewed EKG to monitor while patient receiving methadone and QTc noted to be 445 on 06/09/2023   -Change oxycodone to 30 mg every 4 hours as needed   -Change IV Dilaudid to 1-2 mg every 3 hours as needed for breakthrough pain after oral opioids.  Realize this IV Dilaudid dose is much lower" events compared to other medications though patient describing feeling sleepy after medication so cautiously monitoring at lower dose before prescribing any increases.   -Patient states that she cannot  take neuropathic agents such as gabapentin, pregabalin, or duloxetine due to what she describes as "stomach discomfort".   -Constipation, in setting of chronic opioid use   -Continue senna 2 tabs daily at bedtime as needed.  If patient not having regular daily bowel movements,  recommend scheduling daily instead of as needed.  # Psycho-social/Spiritual Support:  - Support System: Daughters  # Discharge Planning:  To Be Determined  Thank you for allowing the palliative care team to participate in the care [name].  Alvester Morin, DO Palliative Care Provider PMT # [phone]  If patient remains symptomatic despite maximum doses, please call PMT at [phone] between 0700 and 1900. Outside of these hours, please call attending, as PMT does not have night coverage.

## 2023-06-09 NOTE — Progress Notes (Signed)
 Valerie Kerr   DOB:[birth year]   ZO#:[id number]    ASSESSMENT & PLAN:  Leiomyosarcoma [facility]) 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 She is currently undergoing palliative radiation therapy   She is doing poorly due to poor understanding of medications, not taking medications correctly causing uncontrolled pain and poor functional mobility Continue supportive care and radiation treatment  Medical non-compliance She has history of medical noncompliance and has not been taking her medications correctly Currently, I reinforced the importance of her taking all her medications the way it as prescribed  Cancer associated pain She continues to have poorly controlled pain Appreciate palliative care for assistance in pain management  Goals of care, counseling/discussion From previous admission, I have reviewed poor prognosis associated with this disease and with the patient's general decline in functional status, it is not clear to me she would be healthy enough to receive chemotherapy in the future For now, the goals of admission is to get her pain under control I will return to check on her next week If she is discharged over the weekend, I will set up outpatient follow-up  [name], MD 06/09/2023 3:18 PM  Subjective:  I was notified of her admission.  She was supposed to see me in the outpatient clinic for pain management but was admitted overnight due to uncontrolled pain She is still having a lot of pain since admission.  She denies recent constipation.  She appreciate palliative care consult in pain management  Objective:  Vitals:   06/09/23 1011 06/09/23 1447  BP: 109/64 116/62  Pulse: 87 95  Resp: 17 16  Temp: 98.3 F (36.8 C) 98 F (36.7 C)  SpO2: 96% 97%     Intake/Output Summary (Last 24 hours) at 06/09/2023 1518 Last data filed at 06/09/2023 0500 Gross per 24 hour  Intake --  Output 275 ml  Net -275 ml

## 2023-06-09 NOTE — Progress Notes (Signed)
 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  The patient presents to the hospital for uncontrolled pain.  Subjective:  Has not taken her Methadone in 2 days.  She states she is having severe pain in her left lower back, left sacrum which is radiating down her left leg.  She states pain is so severe that it even hurts when she coughs.  She feels that the IV pain medication helps much more than the oxycodone.  Assessment and Plan: Principal Problem:   Cancer-related breakthrough pain - Currently on methadone, oxycodone at 30 mg and IV Dilaudid - The patient refuses to take dexamethasone because she thinks it is causing exacerbation of the pain - Palliative care team has also been contacted to help with symptom management  Active Problems:   Metastatic leiomyosarcoma of bone (HCC) - Currently undergoing palliative radiation - Chemotherapy scheduled for March 17-she has been missing appointments-she states this is because her pain is too severe - Oncology consulted    Essential hypertension-lisinopril HCTZ on hold    GERD (gastroesophageal reflux disease) - Receiving pantoprazole    Generalized anxiety disorder - She receives as needed Xanax for this as outpatient which has been resumed  Constipation - Senna resumed      Code Status: Full Code Total time on patient care: 35 minutes DVT prophylaxis:  enoxaparin (LOVENOX) injection 40 mg Start: 06/09/23 1000 SCDs Start: 06/08/23 2150 Place TED hose Start: 06/08/23 2150     Objective:   Vitals:   06/08/23 1916 06/09/23 0250 06/09/23 0302 06/09/23 0543  BP: 122/66 (!) 94/48 (!) 102/58 105/61  Pulse: 93 93  96   Resp: 17 20  20   Temp: 98.5 F (36.9 C) 98.5 F (36.9 C)  99.6 F (37.6 C)  TempSrc: Oral Oral  Oral  SpO2: 97% 97%  96%  Weight:    72.4 kg  Height:       Filed Weights   06/08/23 1552 06/09/23 0543  Weight: 71.7 kg 72.4 kg   Exam: General exam: Appears comfortable  HEENT: oral mucosa moist Respiratory system: Clear to auscultation.  Cardiovascular system: S1 & S2 heard  Gastrointestinal system: Abdomen soft, non-tender, nondistended. Normal bowel sounds   Extremities: No cyanosis, clubbing or edema Psychiatry:  Mood & affect appropriate.      CBC: Recent Labs  Lab 06/08/23 1555 06/09/23 0607  WBC 8.3 8.6  HGB 10.5* 10.8*  HCT 34.0* 36.5  MCV 89.5 91.3  PLT 296 291   Basic Metabolic Panel: Recent Labs  Lab 06/08/23 1555 06/09/23 0607  NA 137 137  K 3.7 3.9  CL 99 97*  CO2 29 29  GLUCOSE 127* 84  BUN 17 16  CREATININE 0.74 0.89  CALCIUM 8.9 9.2     Scheduled Meds:  calcium-vitamin D  1 tablet Oral BID WC   docusate sodium  100 mg Oral BID   enoxaparin (LOVENOX) injection  40 mg Subcutaneous Q24H   feeding supplement  237 mL Oral BID BM   lidocaine  2 patch Transdermal Q24H   methadone  40 mg Oral Q8H   oxycodone  30 mg Oral Q6H   pantoprazole  40 mg Oral Daily  sodium chloride flush  3 mL Intravenous Q12H   sodium chloride flush  3 mL Intravenous Q12H    Imaging and lab data personally reviewed   Author: Calvert Cantor  06/09/2023 9:29 AM  To contact [facility]>   Check the care team in [facility] and look for the attending/consulting [facility] provider listed  Log into [email or web address] and use [place]'s universal password   Go to> "[facility]"  and find provider  If you still have difficulty reaching the provider, please page the [facility] (Director on Call) for the Hospitalists listed on amion

## 2023-06-09 NOTE — Plan of Care (Signed)

## 2023-06-10 DIAGNOSIS — G893 Neoplasm related pain (acute) (chronic): Secondary | ICD-10-CM | POA: Diagnosis not present

## 2023-06-10 DIAGNOSIS — Z79899 Other long term (current) drug therapy: Secondary | ICD-10-CM | POA: Diagnosis not present

## 2023-06-10 DIAGNOSIS — Z515 Encounter for palliative care: Secondary | ICD-10-CM | POA: Diagnosis not present

## 2023-06-10 DIAGNOSIS — G894 Chronic pain syndrome: Secondary | ICD-10-CM | POA: Diagnosis not present

## 2023-06-10 DIAGNOSIS — C7951 Secondary malignant neoplasm of bone: Secondary | ICD-10-CM | POA: Diagnosis not present

## 2023-06-10 MED ORDER — HYDROMORPHONE HCL 1 MG/ML IJ SOLN
1.0000 mg | INTRAMUSCULAR | Status: DC | PRN
  Administered 2023-06-12: 2 mg via INTRAVENOUS
  Filled 2023-06-10 (×2): qty 2

## 2023-06-10 MED ORDER — LORATADINE 10 MG PO TABS
10.0000 mg | ORAL_TABLET | Freq: Every day | ORAL | Status: DC
  Administered 2023-06-10 – 2023-06-19 (×10): 10 mg via ORAL
  Filled 2023-06-10 (×10): qty 1

## 2023-06-10 MED ORDER — SENNA 8.6 MG PO TABS
2.0000 | ORAL_TABLET | Freq: Every day | ORAL | Status: DC
  Administered 2023-06-10 – 2023-06-11 (×2): 17.2 mg via ORAL
  Filled 2023-06-10 (×2): qty 2

## 2023-06-10 NOTE — Plan of Care (Signed)

## 2023-06-10 NOTE — Progress Notes (Signed)
 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  The patient presents to the hospital for uncontrolled pain.  Subjective:  Continues to have pain in left sacrum, leg and foot. Constipated.   Assessment and Plan: Principal Problem:   Cancer-related breakthrough pain - Currently on methadone, oxycodone at 30 mg and IV Dilaudid - The patient refuses to take dexamethasone because she thinks it is causing exacerbation of the pain - Palliative care team has also been contacted to help with symptom management  Active Problems:   Metastatic leiomyosarcoma of bone (HCC) - Currently undergoing palliative radiation - Chemotherapy scheduled for March 17-she has been missing appointments-she states this is because her pain is too severe - Oncology consulted    Essential hypertension -lisinopril - HCTZ on hold    GERD (gastroesophageal reflux disease) - Receiving pantoprazole    Generalized anxiety disorder - She receives as needed Xanax for this as outpatient which has been resumed  Constipation - Senna resumed      Code Status: Full Code Total time on patient care: 35 minutes DVT prophylaxis:  enoxaparin (LOVENOX) injection 40 mg Start: 06/09/23 1000 SCDs Start: 06/08/23 2150 Place TED hose Start: 06/08/23 2150     Objective:   Vitals:   06/09/23 1011 06/09/23 1447 06/09/23 2057 06/10/23 0418  BP: 109/64 116/62 106/65 119/62  Pulse: 87 95 90 97  Resp: 17 16 18 18   Temp: 98.3 F (36.8 C) 98 F (36.7 C) 98.3 F (36.8 C) 99 F (37.2 C)  TempSrc: Oral  Oral Oral  SpO2: 96% 97% 97% 100%  Weight:      Height:       Filed Weights   06/08/23 1552 06/09/23 0543   Weight: 71.7 kg 72.4 kg   Exam: General exam: Appears comfortable  HEENT: oral mucosa moist Respiratory system: Clear to auscultation.  Cardiovascular system: S1 & S2 heard  Gastrointestinal system: Abdomen soft, non-tender, nondistended. Normal bowel sounds   Extremities: No cyanosis, clubbing or edema Psychiatry:  Mood & affect appropriate.      CBC: Recent Labs  Lab 06/08/23 1555 06/09/23 0607  WBC 8.3 8.6  HGB 10.5* 10.8*  HCT 34.0* 36.5  MCV 89.5 91.3  PLT 296 291   Basic Metabolic Panel: Recent Labs  Lab 06/08/23 1555 06/09/23 0607  NA 137 137  K 3.7 3.9  CL 99 97*  CO2 29 29  GLUCOSE 127* 84  BUN 17 16  CREATININE 0.74 0.89  CALCIUM 8.9 9.2     Scheduled Meds:  calcium-vitamin D  1 tablet Oral BID WC   docusate sodium  100 mg Oral BID   enoxaparin (LOVENOX) injection  40 mg Subcutaneous Q24H   feeding supplement  237 mL Oral BID BM   fluticasone  1 spray Each Nare Daily   lidocaine  2 patch Transdermal Q24H   loratadine  10 mg Oral Daily   methadone  70 mg Oral Daily   And   methadone  40 mg Oral Daily   And   methadone  40 mg Oral QHS   pantoprazole  40 mg Oral Daily   senna  2 tablet Oral Q0600   sodium chloride flush  3 mL Intravenous Q12H   sodium chloride flush  3 mL Intravenous Q12H    Imaging and lab data personally reviewed   Author: Calvert Cantor  06/10/2023 12:22 PM  To contact [facility]>   Check the care team in [facility] and look for the attending/consulting [facility] provider listed  Log into [email or web address] and use [place]'s universal password   Go to> "[facility]"  and find provider  If you still have difficulty reaching the provider, please page the [facility] (Director on Call) for the Hospitalists listed on amion

## 2023-06-10 NOTE — Progress Notes (Signed)
 Daily Progress Note   Patient Name: Valerie Kerr       Date: 06/10/2023 DOB: 07-30-1960  Age: 63 y.o. MRN#: [id number] Attending Physician: Calvert Cantor, MD Primary Care Physician: Hoy Register, MD Admit Date: 06/08/2023 Length of Stay: 1 day  Reason for Consultation/Follow-up: Establishing goals of care and Pain control  Subjective:   CC: Patient feels her pain is getting slightly better today.  Palliative medicine team following up regarding complex medical decision making and pain management.  Subjective:  Reviewed EMR prior to presenting to bedside.  Noted blood pressure maintaining stable and no documentation and vitals about decreased respiratory rate.  At time of EMR review in past 24 hours patient has received as needed IV Dilaudid 2 mg x 3 doses and as needed oxycodone 30 mg x 3 doses.  Patient was scheduled to start receiving methadone 70 mg in a.m., 40 mg in afternoon, and 40 mg in the evening as per her home dose.  Noted patient did not receive her 70 mg methadone this morning as per nursing documentation. Spoke with daytime nurse who noted that nighttime nurse had stated that patient was "drowsy with decreased respiratory rate". Do not see this documented in the EMR.  Presented to bedside to see patient this morning.  Patient laying in bed very alert and interactive.  Patient feels that her pain is slightly better than it was yesterday.  Discussed continuing home regimen of methadone as well as as needed oxycodone.  IV Dilaudid should only be for breakthrough pain after oral oxycodone.  Patient agreement with this. Patient notes her main symptom concern today is actually coughing.  Patient does admit to seasonal allergies.  Upon listening to patient, sounds to have postnasal drip.  Patient has Flonase ordered though has not used this morning.  Counseled on need for using Flonase appropriately.  Patient said that normally she is taking severe sinus Sudafed medication.  Discussed  instead of using medication that is meant for acute purposes, recommend patient taking daily allergy medicine.  Patient agreeing with addition to this.  Spent time answering questions as able.  Noted palliative medicine team will continue to follow with patient's medical journey.  Discussed care with RN again after visit.  Objective:   Vital Signs:  BP 119/62 (BP Location: Left Arm)   Pulse 97   Temp 99 F (37.2 C) (Oral)   Resp 18   Ht 5' 1.5" (1.562 m)   Wt 72.4 kg   SpO2 100%   BMI 29.67 kg/m   Physical Exam: General: NAD, alert, chronically ill-appearing Cardiovascular: RRR Respiratory: no increased work of breathing noted, not in respiratory distress Neuro: Alert and appropriately interacting though overall poor historian  Imaging: I personally reviewed recent imaging.   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  Imaging upon presentation (as compared to PET from 03/2023) showed as seen previously large destructive mass involving the sacrum with extension into the sacroiliac joints at the disc of L5-S1 and potentially involvement of L5.  Noted associated pathologic fractures involving the sacrum and right transverse process of L5 which extended into the sacral space.  Oncology consulted for recommendations.  Palliative medicine team consulted to assist with complex medical  decision making and symptom management.   Recommendations/Plan: # Complex medical decision making/goals of care: # Complex medical decision making/goals of care                -Patient discussing goals for medical care with oncologist at this time.  Patient notes  primary discussion with oncology has revolved around pain control. Palliative medicine team will continue to follow along and engage in conversations as able and appropriate.                -  Code Status: Full Code    # Symptom management Patient is receiving these palliative interventions for symptom management with an intent to improve quality of life.                 -Pain, severe in setting of stage IV leiomyosarcoma of the sacrum                               -Continue home dose of methadone 70 mg in a.m., 40 mg in afternoon, and 40 mg in the evening (every 8 hours apart). This dosing was confirmed with Dr. Bertis Ruddy to be patient's home dose; patient receiving total of 150 mg of methadone in a day.                                                -Personally ordered and reviewed EKG to monitor while patient receiving methadone and QTc noted to be 445 on 06/09/2023                               -Continue oxycodone to 30 mg every 4 hours as needed                               -Continue IV Dilaudid to 1-2 mg every 3 hours as needed for breakthrough pain only to be received after as needed oxycodone                               -Patient states that she cannot take neuropathic agents such as gabapentin, pregabalin, or duloxetine due to what she describes as "stomach discomfort".                  -Constipation, in setting of chronic opioid use                               -Change senna 2 tabs daily as last BM in chart noted to be on 3/12   # Psycho-social/Spiritual Support:  - Support System: Daughters  # Discharge Planning: To Be Determined  Discussed with: RN, patient  Thank you for allowing the palliative care team to participate in the care [name].  Alvester Morin, DO Palliative Care Provider PMT # [phone]  If patient remains symptomatic despite maximum doses, please call PMT at [phone] between 0700 and 1900. Outside of these hours, please call attending, as PMT does  not have night coverage.

## 2023-06-10 NOTE — Plan of Care (Signed)

## 2023-06-11 DIAGNOSIS — Z79899 Other long term (current) drug therapy: Secondary | ICD-10-CM | POA: Diagnosis not present

## 2023-06-11 DIAGNOSIS — K5903 Drug induced constipation: Secondary | ICD-10-CM | POA: Diagnosis not present

## 2023-06-11 DIAGNOSIS — C7951 Secondary malignant neoplasm of bone: Secondary | ICD-10-CM | POA: Diagnosis not present

## 2023-06-11 DIAGNOSIS — Z515 Encounter for palliative care: Secondary | ICD-10-CM | POA: Diagnosis not present

## 2023-06-11 DIAGNOSIS — G893 Neoplasm related pain (acute) (chronic): Secondary | ICD-10-CM | POA: Diagnosis not present

## 2023-06-11 MED ORDER — POLYETHYLENE GLYCOL 3350 17 G PO PACK
17.0000 g | PACK | Freq: Every day | ORAL | Status: DC
  Administered 2023-06-11 – 2023-06-18 (×4): 17 g via ORAL
  Filled 2023-06-11 (×7): qty 1

## 2023-06-11 MED ORDER — SENNA 8.6 MG PO TABS
2.0000 | ORAL_TABLET | Freq: Two times a day (BID) | ORAL | Status: DC
  Administered 2023-06-12 – 2023-06-19 (×15): 17.2 mg via ORAL
  Filled 2023-06-11 (×16): qty 2

## 2023-06-11 NOTE — Progress Notes (Signed)
 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  The patient presents to the hospital for uncontrolled pain.  Subjective:  Continues to have pain but not as severe as before. She has not tried to ambulate in the hospital yet.   Assessment and Plan: Principal Problem:   Cancer-related breakthrough pain - Currently on methadone, oxycodone and IV Dilaudid - The patient refuses to take dexamethasone because she thinks it is causing exacerbation of the pain - Palliative care team has also been contacted to help with symptom management - will start ambulating her to monitor pain control with ambulation  Active Problems:   Metastatic leiomyosarcoma of bone (HCC) - Currently undergoing palliative radiation to the hip - Chemotherapy scheduled for March 17-she has been missing appointments-she states this is because her pain is too severe - Oncology consulted    Essential hypertension -lisinopril - HCTZ on hold    GERD (gastroesophageal reflux disease) - Receiving pantoprazole    Generalized anxiety disorder - She receives as needed Xanax for this as outpatient which has been resumed  Constipation - Senna resumed      Code Status: Full Code Total time on patient care: 35 minutes DVT prophylaxis:  enoxaparin (LOVENOX) injection 40 mg Start: 06/09/23 1000 SCDs Start: 06/08/23 2150 Place TED hose Start: 06/08/23 2150     Objective:   Vitals:   06/09/23 2057 06/10/23 0418 06/10/23 1340 06/10/23 1956  BP: 106/65 119/62 113/61 103/64  Pulse: 90 97 93 91  Resp: 18 18 18 18   Temp: 98.3 F (36.8 C) 99 F (37.2 C) 98.6 F (37 C) 99.5 F (37.5 C)  TempSrc: Oral Oral  Oral Oral  SpO2: 97% 100% 97% 92%  Weight:      Height:       Filed Weights   06/08/23 1552 06/09/23 0543  Weight: 71.7 kg 72.4 kg   Exam: General exam: Appears comfortable  HEENT: oral mucosa moist Respiratory system: Clear to auscultation.  Cardiovascular system: S1 & S2 heard  Gastrointestinal system: Abdomen soft, non-tender, nondistended. Normal bowel sounds   Extremities: No cyanosis, clubbing or edema Psychiatry:  Mood & affect appropriate.      CBC: Recent Labs  Lab 06/08/23 1555 06/09/23 0607  WBC 8.3 8.6  HGB 10.5* 10.8*  HCT 34.0* 36.5  MCV 89.5 91.3  PLT 296 291   Basic Metabolic Panel: Recent Labs  Lab 06/08/23 1555 06/09/23 0607  NA 137 137  K 3.7 3.9  CL 99 97*  CO2 29 29  GLUCOSE 127* 84  BUN 17 16  CREATININE 0.74 0.89  CALCIUM 8.9 9.2     Scheduled Meds:  calcium-vitamin D  1 tablet Oral BID WC   docusate sodium  100 mg Oral BID   enoxaparin (LOVENOX) injection  40 mg Subcutaneous Q24H   feeding supplement  237 mL Oral BID BM   fluticasone  1 spray Each Nare Daily   lidocaine  2 patch Transdermal Q24H   loratadine  10 mg Oral Daily   methadone  70 mg Oral Daily   And   methadone  40 mg Oral Daily   And   methadone  40 mg Oral QHS  pantoprazole  40 mg Oral Daily   senna  2 tablet Oral Q0600   sodium chloride flush  3 mL Intravenous Q12H   sodium chloride flush  3 mL Intravenous Q12H    Imaging and lab data personally reviewed   Author: Calvert Cantor  06/11/2023 10:29 AM  To contact [facility]>   Check the care team in [facility] and look for the attending/consulting [facility] provider listed  Log into [email or web address] and use [place]'s universal password   Go to> "[facility]"  and find provider  If you still have difficulty reaching the provider, please page the [facility] (Director on Call) for the Hospitalists listed on amion

## 2023-06-11 NOTE — Plan of Care (Signed)
  Problem: Education: Goal: Knowledge of General Education information will improve Description: Including pain rating scale, medication(s)/side effects and non-pharmacologic comfort measures Outcome: Progressing   Problem: Health Behavior/Discharge Planning: Goal: Ability to manage health-related needs will improve Outcome: Progressing   Problem: Clinical Measurements: Goal: Ability to maintain clinical measurements within normal limits will improve Outcome: Progressing Goal: Will remain free from infection Outcome: Progressing Goal: Diagnostic test results will improve Outcome: Progressing Goal: Respiratory complications will improve Outcome: Progressing Goal: Cardiovascular complication will be avoided Outcome: Progressing   Problem: Coping: Goal: Level of anxiety will decrease Outcome: Progressing   Problem: Pain Managment: Goal: General experience of comfort will improve and/or be controlled Outcome: Progressing

## 2023-06-11 NOTE — Plan of Care (Signed)
   Problem: Education: Goal: Knowledge of General Education information will improve Description Including pain rating scale, medication(s)/side effects and non-pharmacologic comfort measures Outcome: Progressing   Problem: Health Behavior/Discharge Planning: Goal: Ability to manage health-related needs will improve Outcome: Progressing

## 2023-06-11 NOTE — Progress Notes (Signed)
 Daily Progress Note   Patient Name: Valerie Kerr       Date: 06/11/2023 DOB: 1961-01-27  Age: 63 y.o. MRN#: [id number] Attending Physician: Calvert Cantor, MD Primary Care Physician: Hoy Register, MD Admit Date: 06/08/2023 Length of Stay: 2 days  Reason for Consultation/Follow-up: Establishing goals of care and Pain control  Subjective:   CC: Patient feels her pain continues to improve.  Palliative medicine team following up regarding complex medical decision making and pain management.  Subjective:  Reviewed EMR prior to presenting to bedside.  At time of EMR review in past 24 hours patient has received as needed oxycodone 30 mg x 5 doses and has not required any IV Dilaudid as needed.  Patient continues to receive scheduled methadone 70 mg in a.m., 40 mg in afternoon, and 40 mg at night.  Presented to bedside to see patient.  No family present at bedside.  Patient laying comfortably in bed.  Discussed patient's symptom management at this time.  Patient feels her pain continues to improve.  Patient also noted she was able to get sleep last night.  Patient feels her seasonal allergy symptoms improved with nasal spray and Claritin.  Patient did not have any other symptom concerns today.  Noted continuing with current pain medication management.  All questions answered at that time.  Noted palliative medicine team will continue to follow with patient's medical journey.  Objective:   Vital Signs:  BP 103/64 (BP Location: Left Arm)   Pulse 91   Temp 99.5 F (37.5 C) (Oral)   Resp 18   Ht 5' 1.5" (1.562 m)   Wt 72.4 kg   SpO2 92%   BMI 29.67 kg/m   Physical Exam: General: NAD, alert, chronically ill-appearing Cardiovascular: RRR Respiratory: no increased work of breathing noted, not in respiratory distress Neuro: Alert and appropriately interacting though overall poor historian  Imaging: I personally reviewed recent imaging.   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  Imaging upon presentation (as compared to PET from 03/2023) showed as seen previously large destructive mass involving the sacrum with extension into the sacroiliac joints at the disc of L5-S1 and potentially involvement of L5.  Noted associated pathologic fractures involving the sacrum and right transverse process of L5 which extended into the sacral space.  Oncology consulted for recommendations.  Palliative medicine team consulted to assist with complex medical decision making and symptom management.   Recommendations/Plan: # Complex medical decision making/goals of care:                -Patient discussing goals for medical care with oncologist at this time.  Patient notes primary discussion with oncology has revolved around pain control. Palliative medicine team will continue to follow along and engage in conversations as able and appropriate.                -  Code Status: Full Code    # Symptom management Patient is receiving these palliative interventions for symptom management with an intent to improve quality of life.                 -Pain, severe in setting of stage IV leiomyosarcoma of the sacrum                               -  Continue home dose of methadone 70 mg in a.m., 40 mg in afternoon, and 40 mg in the evening (every 8 hours apart). This dosing was confirmed with Dr. Bertis Ruddy to be patient's home dose; patient receiving total of 150 mg of methadone in a day.                                                -Personally ordered and reviewed EKG to monitor while patient receiving methadone and QTc noted to be 445 on 06/09/2023                                -Continue oxycodone to 30 mg every 4 hours as needed                               -Continue IV Dilaudid to 1-2 mg every 3 hours as needed for breakthrough pain only to be received after as needed oxycodone                               -Patient states that she cannot take neuropathic agents such as gabapentin, pregabalin, or duloxetine due to what she describes as "stomach discomfort".                  -Constipation, in setting of chronic opioid use                               -Change senna 2 tabs twice daily   -Start MiraLAX 17 g daily   # Psycho-social/Spiritual Support:  - Support System: Daughters  # Discharge Planning: To Be Determined  Thank you for allowing the palliative care team to participate in the care [name].  Alvester Morin, DO Palliative Care Provider PMT # [phone]  If patient remains symptomatic despite maximum doses, please call PMT at [phone] between 0700 and 1900. Outside of these hours, please call attending, as PMT does not have night coverage.

## 2023-06-12 ENCOUNTER — Ambulatory Visit: Admitting: Hematology and Oncology

## 2023-06-12 ENCOUNTER — Ambulatory Visit
Admission: RE | Admit: 2023-06-12 | Discharge: 2023-06-12 | Disposition: A | Discharge status: Home / Self Care | Source: Ambulatory Visit | Attending: Radiation Oncology | Admitting: Radiation Oncology

## 2023-06-12 ENCOUNTER — Other Ambulatory Visit: Payer: Self-pay

## 2023-06-12 ENCOUNTER — Ambulatory Visit

## 2023-06-12 DIAGNOSIS — C7951 Secondary malignant neoplasm of bone: Secondary | ICD-10-CM | POA: Diagnosis not present

## 2023-06-12 DIAGNOSIS — Z515 Encounter for palliative care: Secondary | ICD-10-CM | POA: Diagnosis not present

## 2023-06-12 DIAGNOSIS — Z79899 Other long term (current) drug therapy: Secondary | ICD-10-CM | POA: Diagnosis not present

## 2023-06-12 DIAGNOSIS — G893 Neoplasm related pain (acute) (chronic): Secondary | ICD-10-CM | POA: Diagnosis not present

## 2023-06-12 DIAGNOSIS — R5381 Other malaise: Secondary | ICD-10-CM | POA: Diagnosis not present

## 2023-06-12 LAB — RAD ONC ARIA SESSION SUMMARY
Course Elapsed Days: 25
Plan Fractions Treated to Date: 11
Plan Prescribed Dose Per Fraction: 2.5 Gy
Plan Total Fractions Prescribed: 15
Plan Total Prescribed Dose: 37.5 Gy
Reference Point Dosage Given to Date: 27.5 Gy
Reference Point Session Dosage Given: 2.5 Gy
Session Number: 11

## 2023-06-12 LAB — URINALYSIS, ROUTINE W REFLEX MICROSCOPIC
Bilirubin Urine: NEGATIVE
Glucose, UA: NEGATIVE mg/dL
Ketones, ur: NEGATIVE mg/dL
Nitrite: POSITIVE — AB
Protein, ur: 100 mg/dL — AB
Specific Gravity, Urine: 1.011 (ref 1.005–1.030)
WBC, UA: >50 WBC/hpf (ref 0–5)
pH: 5 (ref 5.0–8.0)

## 2023-06-12 NOTE — Progress Notes (Signed)
 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  The patient presents to the hospital for uncontrolled pain.  Subjective:  Pain is better controlled. States again that she has burning micturition. Is still not able to get out of bed.   Assessment and Plan: Principal Problem:   Cancer-related breakthrough pain - Currently on methadone, oxycodone and IV Dilaudid - The patient refuses to take dexamethasone because she thinks it is causing exacerbation of the pain - Palliative care team has also been contacted to help with symptom management    Active Problems:   Metastatic leiomyosarcoma of bone (HCC) - Currently undergoing palliative radiation to the hip - Chemotherapy was scheduled for March 17-she has been missing appointments-she states this is because her pain is too severe - Oncology consulted  Dysuria - check UA    Essential hypertension -lisinopril - HCTZ on hold    GERD (gastroesophageal reflux disease) - Receiving pantoprazole    Generalized anxiety disorder - She receives as needed Xanax for this as outpatient which has been resumed  Constipation - Senna resumed      Code Status: Full Code Total time on patient care: 35 minutes DVT prophylaxis:  enoxaparin (LOVENOX) injection 40 mg Start: 06/09/23 1000 SCDs Start: 06/08/23 2150 Place TED hose Start: 06/08/23 2150     Objective:   Vitals:   06/10/23 1956 06/11/23 1338 06/11/23 2115 06/12/23 0510  BP: 103/64 (!) 109/58 104/67 (!) 106/57  Pulse: 91 96 (!) 106 95  Resp: 18 18 16 18   Temp: 99.5 F (37.5 C) 98.8 F (37.1 C) 99 F (37.2 C) 98.4 F (36.9 C)  TempSrc: Oral Oral Oral Oral  SpO2:  92% 94% 92% 97%  Weight:      Height:       Filed Weights   06/08/23 1552 06/09/23 0543  Weight: 71.7 kg 72.4 kg   Exam: General exam: Appears comfortable  HEENT: oral mucosa moist Respiratory system: Clear to auscultation.  Cardiovascular system: S1 & S2 heard  Gastrointestinal system: Abdomen soft, non-tender, nondistended. Normal bowel sounds   Extremities: No cyanosis, clubbing or edema Psychiatry:  Mood & affect appropriate.      CBC: Recent Labs  Lab 06/08/23 1555 06/09/23 0607  WBC 8.3 8.6  HGB 10.5* 10.8*  HCT 34.0* 36.5  MCV 89.5 91.3  PLT 296 291   Basic Metabolic Panel: Recent Labs  Lab 06/08/23 1555 06/09/23 0607  NA 137 137  K 3.7 3.9  CL 99 97*  CO2 29 29  GLUCOSE 127* 84  BUN 17 16  CREATININE 0.74 0.89  CALCIUM 8.9 9.2     Scheduled Meds:  calcium-vitamin D  1 tablet Oral BID WC   docusate sodium  100 mg Oral BID   enoxaparin (LOVENOX) injection  40 mg Subcutaneous Q24H   feeding supplement  237 mL Oral BID BM   fluticasone  1 spray Each Nare Daily   lidocaine  2 patch Transdermal Q24H   loratadine  10 mg Oral Daily   methadone  70 mg Oral Daily   And   methadone  40 mg Oral Daily   And   methadone  40 mg Oral QHS  pantoprazole  40 mg Oral Daily   polyethylene glycol  17 g Oral Daily   senna  2 tablet Oral BID   sodium chloride flush  3 mL Intravenous Q12H   sodium chloride flush  3 mL Intravenous Q12H    Imaging and lab data personally reviewed   Author: Calvert Cantor  06/12/2023 9:49 AM  To contact [facility]>   Check the care team in [facility] and look for the attending/consulting [facility] provider listed  Log into [email or web address] and use [place]'s universal password   Go to> "[facility]"  and find provider  If you still have difficulty reaching the provider, please page the [facility] (Director on Call) for the Hospitalists listed on amion

## 2023-06-12 NOTE — Progress Notes (Signed)
   06/12/23 1548  TOC Brief Assessment  Insurance and Status Reviewed  Patient has primary care physician Yes Alvis Lemmings, Enobong, MD)  Home environment has been reviewed FRom home with daughters  Prior level of function: Independent with self care assist with household chores  Prior/Current Home Services No current home services  Social Drivers of Health Review SDOH reviewed no interventions necessary  Readmission risk has been reviewed Yes  Transition of care needs no transition of care needs at this time

## 2023-06-12 NOTE — Plan of Care (Signed)

## 2023-06-12 NOTE — Progress Notes (Signed)
 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 She is currently undergoing palliative radiation therapy   She is doing poorly due to poor understanding of medications, not taking medications correctly causing uncontrolled pain and poor functional mobility Continue supportive care and radiation treatment   Cancer associated pain Appreciate assistance from palliative care team in pain management Her pain is better controlled   Discharge planning Would defer to primary service I will sign off I will schedule outpatient follow-up after she is discharged  [name], MD 06/12/2023 9:04 AM  Subjective:  Her pain is better controlled since admission.  Reviewed documentation from palliative care team.  However, she felt she is not ready although she could not tell me exactly why she is not ready to be discharged  Objective:  Vitals:   06/11/23 2115 06/12/23 0510  BP: 104/67 (!) 106/57  Pulse: (!) 106 95  Resp: 16 18  Temp: 99 F (37.2 C) 98.4 F (36.9 C)  SpO2: 92% 97%     Intake/Output Summary (Last 24 hours) at 06/12/2023 0904 Last data filed at 06/12/2023 0500 Gross per 24 hour  Intake 240 ml  Output 650 ml  Net -410 ml

## 2023-06-12 NOTE — Progress Notes (Signed)
 Daily Progress Note   Patient Name: Valerie Kerr       Date: 06/12/2023 DOB: 03-05-1961  Age: 63 y.o. MRN#: [id number] Attending Physician: Calvert Cantor, MD Primary Care Physician: Hoy Register, MD Admit Date: 06/08/2023 Length of Stay: 3 days  Reason for Consultation/Follow-up: Establishing goals of care and Pain control  Subjective:   CC: Patient feels her pain continues to improve.  Palliative medicine team following up regarding complex medical decision making and pain management.  Subjective:  Reviewed EMR prior to presenting to bedside.  At time of EMR review in past 24 hours patient has received as needed oxycodone 30 mg x 5 doses and has as needed IV Dilaudid 2 mg x 1 dose.  Patient continues to receive scheduled methadone 70 mg in a.m., 40 mg in afternoon, and 40 mg at night. Discussed care with hospitalist and oncologist prior to seeing patient.  Presented to bedside to see patient.  Patient laying comfortably in bed.  Able to follow-up regarding patient's symptom management.  Patient notes that her pain has continued to improve overall.  Patient notes acute worsening with pain this morning when she got up to go to bedside commode.  Discussed continuing as needed oxycodone to assist with pain management which patient feels allows for longer acting relief than IV.  I again spent time discussing how IV medication is short acting so would continue to recommend oral medication for breakthrough pain.  With permission, able to discuss goals for medical care moving forward.  Patient notes that she feels "lost" and she is unsure of the plan.  Discussed possible pathways for medical care moving forward.  With permission, discussed that patient is currently receiving appropriate medical management.  If patient is unable to work with PT/OT to improve her functional status, that would mean she is not a candidate for further cancer directed therapies.  Noted that alternative pathway would be  if patient is unable to regain functional status, would then focus on comfort care with support of hospice.  Patient in disbelief about needing hospice and noted that she wants to continue working with PT/OT and to "not give up on herself".  Discussed how one is not "giving up on themselves" if hospice is required due to symptom management and if someone is no longer able to receive cancer directed therapies.  Patient noted that she did not want to talk further about it and that she plans to participate with PT/OT at this time so that she can regain her strength and resume cancer directed therapies.  Acknowledged and noted would inform care team.  All questions answered at that time.  Noted palliative medicine team continue to follow along with patient's medical journey.  Updated IDT including hospitalist, oncologist, and RN.  Objective:   Vital Signs:  BP (!) 106/57 (BP Location: Right Arm)   Pulse 95   Temp 98.4 F (36.9 C) (Oral)   Resp 18   Ht 5' 1.5" (1.562 m)   Wt 72.4 kg   SpO2 97%   BMI 29.67 kg/m   Physical Exam: General: NAD, alert, chronically ill-appearing Cardiovascular: RRR Respiratory: no increased work of breathing noted, not in respiratory distress Neuro: Alert, interactive  Imaging: I personally reviewed recent imaging.   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  Imaging upon presentation (as compared to PET from 03/2023) showed as seen previously large destructive mass involving the sacrum with extension into the sacroiliac joints at the disc of L5-S1 and potentially involvement of L5.  Noted associated pathologic fractures  involving the sacrum and right transverse process of L5 which extended into the sacral space.  Oncology consulted for recommendations.  Palliative medicine team consulted to assist with complex medical decision making and symptom management.   Recommendations/Plan: # Complex medical decision making/goals of care:                -Discussed care with patient as detailed above in HPI.  Discussed pathways for medical care moving forward.  With permission, expressed concern that if patient's functional status did not improve, would need to change pathway focused to comfort focused care with hospice support.  Patient notes she is motivated for PT/OT at this time and so wants to improve her functional status to possibly pursue further cancer directed therapies in the outpatient setting.  Acknowledged and noted palliative medicine team would continue to engage in conversation as able and appropriate.                -  Code Status: Full Code    # Symptom management Patient is receiving these palliative interventions for symptom management with an intent to improve quality of life.                 -Pain, severe in setting of stage IV leiomyosarcoma of the sacrum                               -Continue home dose of methadone 70 mg in a.m., 40 mg in afternoon, and 40 mg in the evening (every 8 hours apart). This dosing was confirmed with Dr. Bertis Ruddy to be patient's home dose; patient receiving total of 150 mg of methadone in a day.  May need further titration based on patient's as needed needs once more sure of steady state as unsure how patient was taking medications at home and if regularly.                                              -Personally ordered and reviewed EKG to monitor while patient receiving methadone and QTc noted to be 445 on 06/09/2023                               -Continue oxycodone to 30 mg every 4 hours as needed                               -Continue IV Dilaudid to 1-2 mg every 3 hours as  needed for breakthrough pain only to be received after as needed oxycodone.  Realize this is lower as needed dose compared to other medications though recommend oral medication prior to IV.                               -Patient states that she cannot take neuropathic agents such as gabapentin, pregabalin,  or duloxetine due to what she describes as "stomach discomfort".                  -Constipation, in setting of chronic opioid use                               -Change senna 2 tabs twice daily   -Continue MiraLAX 17 g daily   # Psycho-social/Spiritual Support:  - Support System: [facility]  # Discharge Planning: To Be Determined  Thank you for allowing the palliative care team to participate in the care [name].  Alvester Morin, DO Palliative Care Provider PMT # [phone]  If patient remains symptomatic despite maximum doses, please call PMT at [phone] between 0700 and 1900. Outside of these hours, please call attending, as PMT does not have night coverage.  Personally spent 35 minutes in patient care including extensive chart review (labs, imaging, progress/consult notes, vital signs), medically appropraite exam, discussed with treatment team, education to patient, family, and staff, documenting clinical information, medication review and management, coordination of care, and available advanced directive documents.

## 2023-06-13 ENCOUNTER — Encounter: Payer: Self-pay | Admitting: Hematology and Oncology

## 2023-06-13 ENCOUNTER — Ambulatory Visit
Admission: RE | Admit: 2023-06-13 | Discharge: 2023-06-13 | Disposition: A | Discharge status: Home / Self Care | Source: Ambulatory Visit | Attending: Radiation Oncology | Admitting: Radiation Oncology

## 2023-06-13 ENCOUNTER — Ambulatory Visit

## 2023-06-13 ENCOUNTER — Other Ambulatory Visit: Payer: Self-pay

## 2023-06-13 DIAGNOSIS — Z515 Encounter for palliative care: Secondary | ICD-10-CM

## 2023-06-13 DIAGNOSIS — R5381 Other malaise: Secondary | ICD-10-CM | POA: Diagnosis not present

## 2023-06-13 DIAGNOSIS — K5903 Drug induced constipation: Secondary | ICD-10-CM

## 2023-06-13 DIAGNOSIS — Z7189 Other specified counseling: Secondary | ICD-10-CM

## 2023-06-13 DIAGNOSIS — Z79899 Other long term (current) drug therapy: Secondary | ICD-10-CM

## 2023-06-13 DIAGNOSIS — C419 Malignant neoplasm of bone and articular cartilage, unspecified: Secondary | ICD-10-CM | POA: Diagnosis not present

## 2023-06-13 DIAGNOSIS — C7951 Secondary malignant neoplasm of bone: Secondary | ICD-10-CM

## 2023-06-13 DIAGNOSIS — G893 Neoplasm related pain (acute) (chronic): Secondary | ICD-10-CM | POA: Diagnosis not present

## 2023-06-13 LAB — RAD ONC ARIA SESSION SUMMARY
Course Elapsed Days: 26
Plan Fractions Treated to Date: 12
Plan Prescribed Dose Per Fraction: 2.5 Gy
Plan Total Fractions Prescribed: 15
Plan Total Prescribed Dose: 37.5 Gy
Reference Point Dosage Given to Date: 30 Gy
Reference Point Session Dosage Given: 2.5 Gy
Session Number: 12

## 2023-06-13 MED ORDER — ONDANSETRON HCL 4 MG/2ML IJ SOLN: 4.0000 mg | Freq: Four times a day (QID) | INTRAMUSCULAR | Status: DC | PRN

## 2023-06-13 MED ORDER — CEFADROXIL 500 MG PO CAPS
500.0000 mg | ORAL_CAPSULE | Freq: Two times a day (BID) | ORAL | Status: AC
  Administered 2023-06-13 – 2023-06-18 (×12): 500 mg via ORAL
  Filled 2023-06-13 (×12): qty 1

## 2023-06-13 NOTE — Progress Notes (Signed)
 Daily Progress Note   Patient Name: Valerie Kerr       Date: 06/13/2023 DOB: 09/05/1960  Age: 63 y.o. MRN#: [id number] Attending Physician: Calvert Cantor, MD Primary Care Physician: Hoy Register, MD Admit Date: 06/08/2023 Length of Stay: 4 days  Reason for Consultation/Follow-up: Establishing goals of care and Pain control  Subjective:   CC: Patient feels her pain continues to improve.She complains of nausea, she just came back from radiation.    Palliative medicine team following up regarding complex medical decision making and pain management.  Subjective:  Reviewed EMR prior to presenting to bedside.  She has as needed oxycodone 30 mg PO PRN, also on PRN IV Dilaudid   Patient continues to receive scheduled methadone 70 mg in a.m., 40 mg in afternoon, and 40 mg at night.    Presented to bedside to see patient.  Patient laying comfortably in bed.  She feels nauseous, family member also on the phone.  Discussed with patient regarding her symptom management.  Patient notes that her pain has continued to improve overall.     All questions answered at that time.  Noted palliative medicine team continue to follow along with patient's medical journey.   Objective:   Vital Signs:  BP (!) 88/60 (BP Location: Left Arm)   Pulse 85   Temp 98.3 F (36.8 C) (Oral)   Resp 14   Ht 5' 1.5" (1.562 m)   Wt 72.4 kg   SpO2 91%   BMI 29.67 kg/m   Physical Exam: General: NAD, alert, chronically ill-appearing Cardiovascular: RRR Respiratory: no increased work of breathing noted, not in respiratory distress Neuro: Alert, interactive  Imaging: I personally reviewed recent imaging.   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  Imaging upon presentation (as compared to PET from 03/2023) showed as seen previously large destructive mass involving the sacrum with extension into the sacroiliac joints at the disc of L5-S1 and potentially involvement of L5.  Noted associated pathologic fractures involving the sacrum and right transverse process of L5 which extended into the sacral space.  Oncology consulted for recommendations.  Palliative medicine team consulted to assist with complex medical decision making and symptom management.   Recommendations/Plan: # Complex medical decision making/goals of care:                -Discussed care with patient as detailed above in HPI.  Discussed pathways for medical care moving forward.  With permission, expressed concern that if patient's functional status did not improve, would need to change pathway focused to comfort focused care with hospice support.  Patient notes she is motivated for PT/OT at this time and so wants to improve her functional status to possibly pursue further cancer directed therapies in the outpatient setting.  Acknowledged and noted palliative medicine team would continue to engage in conversation as able and appropriate.                -  Code Status: Full Code    # Symptom management Patient is receiving these palliative interventions for symptom management with an intent to improve quality of life.                 -  Pain, severe in setting of stage IV leiomyosarcoma of the sacrum                               -Continue home dose of methadone 70 mg in a.m., 40 mg in afternoon, and 40 mg in the evening (every 8 hours apart). Patient receiving total of 150 mg of methadone in a day.  May need further titration based on patient's as needed needs once more sure of steady state as unsure how patient was taking medications at home and if regularly.                                               -Personally ordered and reviewed EKG to monitor while patient receiving methadone and QTc noted to be 445 on 06/09/2023                               -Continue oxycodone to 30 mg every 4 hours as needed                               -Continue IV Dilaudid to 1-2 mg every 3 hours as needed for breakthrough pain only to be received after as needed oxycodone.  Realize this is lower as needed dose compared to other medications though recommend oral medication prior to IV.                               -Patient states that she cannot take neuropathic agents such as gabapentin, pregabalin, or duloxetine due to what she describes as "stomach discomfort". IV Zofran PRN, also on PO anti nausea medication.                   -Constipation, in setting of chronic opioid use                               -Change senna 2 tabs twice daily   -Continue MiraLAX 17 g daily   # Psycho-social/Spiritual Support:  - Support System: [facility]  # Discharge Planning: To Be Determined  Thank you for allowing the palliative care team to participate in the care [name].  Mod MDM [name] MD Palliative Care Provider PMT # [phone]  If patient remains symptomatic despite maximum doses, please call PMT at [phone] between 0700 and 1900. Outside of these hours, please call attending, as PMT does not have night coverage.

## 2023-06-13 NOTE — Plan of Care (Signed)
  Problem: Education: Goal: Knowledge of General Education information will improve Description: Including pain rating scale, medication(s)/side effects and non-pharmacologic comfort measures Outcome: Progressing   Problem: Clinical Measurements: Goal: Respiratory complications will improve Outcome: Progressing Goal: Cardiovascular complication will be avoided Outcome: Progressing   Problem: Nutrition: Goal: Adequate nutrition will be maintained Outcome: Progressing   Problem: Elimination: Goal: Will not experience complications related to urinary retention Outcome: Progressing   

## 2023-06-13 NOTE — Progress Notes (Signed)
 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  The patient presents to the hospital for uncontrolled pain.  Subjective:  Continues to have severe R sacral pain. Not ready to try to get out of bed. Dysuria has not resolved yet either.  Assessment and Plan: Principal Problem:   Cancer-related breakthrough pain - Currently on methadone, oxycodone and IV Dilaudid - The patient refuses to take dexamethasone because she thinks it is causing exacerbation of the pain - Palliative care team has also been contacted to help with symptom management - still not getting out of bed- has a daughter who lives with her who can help her at home    Active Problems:   Metastatic leiomyosarcoma of bone (HCC) - Currently undergoing palliative radiation to the hip - Chemotherapy was scheduled for March 17-she has been missing appointments-she states this is because her pain is too severe - Oncology consulted  Dysuria - UA +- she grew E coli last time and was not treated- start Cefadroxil    Essential hypertension -lisinopril - HCTZ on hold    GERD (gastroesophageal reflux disease) - Receiving pantoprazole    Generalized anxiety disorder - She receives as needed Xanax for this as outpatient which has been resumed  Constipation - Senna resumed      Code Status: Full Code Total time on patient care: 35 minutes DVT prophylaxis:  enoxaparin (LOVENOX) injection 40 mg Start: 06/09/23 1000 SCDs Start: 06/08/23 2150 Place TED hose Start: 06/08/23 2150     Objective:   Vitals:   06/12/23 0510 06/12/23 1429 06/12/23 2043 06/13/23 0405  BP: (!) 106/57 (!) 100/58 (!) 108/55 108/66   Pulse: 95 100 97 97  Resp: 18 20 20 17   Temp: 98.4 F (36.9 C) 98 F (36.7 C) 98.8 F (37.1 C) 98.9 F (37.2 C)  TempSrc: Oral Oral Oral Oral  SpO2: 97% 94% 92% 100%  Weight:      Height:       Filed Weights   06/08/23 1552 06/09/23 0543  Weight: 71.7 kg 72.4 kg   Exam: General exam: Appears comfortable  HEENT: oral mucosa moist Respiratory system: Clear to auscultation.  Cardiovascular system: S1 & S2 heard  Gastrointestinal system: Abdomen soft, non-tender, nondistended. Normal bowel sounds   Extremities: No cyanosis, clubbing or edema Psychiatry:  Mood & affect appropriate.      CBC: Recent Labs  Lab 06/08/23 1555 06/09/23 0607  WBC 8.3 8.6  HGB 10.5* 10.8*  HCT 34.0* 36.5  MCV 89.5 91.3  PLT 296 291   Basic Metabolic Panel: Recent Labs  Lab 06/08/23 1555 06/09/23 0607  NA 137 137  K 3.7 3.9  CL 99 97*  CO2 29 29  GLUCOSE 127* 84  BUN 17 16  CREATININE 0.74 0.89  CALCIUM 8.9 9.2     Scheduled Meds:  calcium-vitamin D  1 tablet Oral BID WC   cefadroxil  500 mg Oral BID   docusate sodium  100 mg Oral BID   enoxaparin (LOVENOX) injection  40 mg Subcutaneous Q24H   feeding supplement  237 mL Oral BID BM   fluticasone  1 spray Each Nare Daily   lidocaine  2 patch Transdermal  Q24H   loratadine  10 mg Oral Daily   methadone  70 mg Oral Daily   And   methadone  40 mg Oral Daily   And   methadone  40 mg Oral QHS   pantoprazole  40 mg Oral Daily   polyethylene glycol  17 g Oral Daily   senna  2 tablet Oral BID   sodium chloride flush  3 mL Intravenous Q12H   sodium chloride flush  3 mL Intravenous Q12H    Imaging and lab data personally reviewed   Author: Calvert Cantor  06/13/2023 11:54 AM  To contact [facility]>   Check the care team in [facility] and look for the attending/consulting [facility] provider listed  Log into [email or web address] and use [place]'s universal password   Go to> "[facility]"  and find provider  If you still have  difficulty reaching the provider, please page the [facility] (Director on Call) for the Hospitalists listed on amion

## 2023-06-14 ENCOUNTER — Other Ambulatory Visit: Payer: Self-pay | Admitting: Hematology and Oncology

## 2023-06-14 ENCOUNTER — Ambulatory Visit

## 2023-06-14 ENCOUNTER — Ambulatory Visit
Admission: RE | Admit: 2023-06-14 | Discharge: 2023-06-14 | Disposition: A | Discharge status: Home / Self Care | Source: Ambulatory Visit | Attending: Radiation Oncology | Admitting: Radiation Oncology

## 2023-06-14 ENCOUNTER — Other Ambulatory Visit: Payer: Self-pay

## 2023-06-14 ENCOUNTER — Inpatient Hospital Stay

## 2023-06-14 DIAGNOSIS — G893 Neoplasm related pain (acute) (chronic): Secondary | ICD-10-CM | POA: Diagnosis not present

## 2023-06-14 LAB — RAD ONC ARIA SESSION SUMMARY
Course Elapsed Days: 27
Plan Fractions Treated to Date: 13
Plan Prescribed Dose Per Fraction: 2.5 Gy
Plan Total Fractions Prescribed: 15
Plan Total Prescribed Dose: 37.5 Gy
Reference Point Dosage Given to Date: 32.5 Gy
Reference Point Session Dosage Given: 2.5 Gy
Session Number: 13

## 2023-06-14 NOTE — Progress Notes (Signed)
 Physical Therapy Treatment Patient Details Name: Valerie Kerr MRN: [id number] DOB: [birth year] Today's Date: 06/14/2023   History of Present Illness 63 yo female presents to the hospital for uncontrolled pain. CT 3/13: As seen on prior PET-CT there is a large destructive mass involving  the sacrum with extension to the sacroiliac joints, the at the disc  at L5-S1 and potentially involvement of L5. Associated pathologic  fractures involving the sacrum and the right transverse process of  L5. This extends into the presacral space.  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    PT Comments  Pt supine in bed upon arrival with bed in flat positioned for comfort per pt. Pt reports high pain with attempting to get to [facility] earlier with nursing but agreeable to therapy with encouragement and education. Pt needing supv with mobility, therapist offering education on avoiding breath holding and functional movement to decrease pain with transferring to EOB and powering to stand with questionable carryover. Pt tolerates static standing for ~20 sec before needing to sit, able to dorsiflexion bilaterally but unable to fully shift weight laterally, raise heels or clear either foot up off of floor. Pt tolerates BLE strengthening exercises, does endorse pain with exercises, encouraged to perform in smaller ROM for comfort and pt reports success with smaller ROM. Encouraged pt to mobilize with nursing daily as able and perform exercises 2-3x daily as able.   If plan is discharge home, recommend the following: A little help with walking and/or transfers;A little help with bathing/dressing/bathroom;Assistance with cooking/housework;Assist for transportation   Can travel by private vehicle        Equipment Recommendations  None recommended by PT    Recommendations for Other Services        Precautions / Restrictions Precautions Precautions: Fall Restrictions Weight Bearing Restrictions Per Provider Order: No     Mobility  Bed Mobility Overal bed mobility: Needs Assistance Bed Mobility: Supine to Sit, Sit to Supine     Supine to sit: Supervision, HOB elevated, Used rails Sit to supine: Supervision, HOB elevated, Used rails   General bed mobility comments: supv for supine<>sit, attempted to educate on log roll and avoiging breath holding to improve pain but pt self directed, strong UE assist on bedrails to upright trunk and mobilize to EOB    Transfers Overall transfer level: Needs assistance Equipment used: Rolling walker (2 wheels) Transfers: Sit to/from Stand Sit to Stand: Supervision           General transfer comment: verbal cues for hand placement with STS from EOB, able to power up without physical assistance, pt with increased pain while powering up and static standing, able to elevate toes but unable to fully shift weight laterally to clear either floor, tolerates static stand for ~20 seconds with RW before needing to sit due to pain    Ambulation/Gait               General Gait Details: unable to take steps due to pain   Stairs             Wheelchair Mobility     Tilt Bed    Modified Rankin (Stroke Patients Only)       Balance Overall balance assessment: Mild deficits observed, not formally tested  Communication Communication Communication: No apparent difficulties  Cognition Arousal: Alert Behavior During Therapy: WFL for tasks assessed/performed   PT - Cognitive impairments: No apparent impairments                         Following commands: Intact      Cueing    Exercises General Exercises - Lower Extremity Ankle Circles/Pumps: AROM, Both, 10 reps, Supine Quad Sets: AROM, Both, Supine (3 reps) Heel Slides: AROM, Both, Supine (3  reps) Hip ABduction/ADduction: AROM, Both, Supine (3 reps)    General Comments        Pertinent Vitals/Pain Pain Assessment Pain Assessment: 0-10 Pain Score: 10-Worst pain ever Pain Location: L buttock, posterior BLE, bil feet Pain Descriptors / Indicators: Sharp, Stabbing, Grimacing, Guarding Pain Intervention(s): Limited activity within patient's tolerance, Monitored during session, Premedicated before session, Repositioned    Home Living                          Prior Function            PT Goals (current goals can now be found in the care plan section) Acute Rehab PT Goals Patient Stated Goal: return home with daughters assisting PT Goal Formulation: With patient Time For Goal Achievement: 06/23/23 Potential to Achieve Goals: Good Progress towards PT goals: Progressing toward goals (slowly, limited by pain)    Frequency    Min 2X/week      PT Plan      Co-evaluation              AM-PAC PT "6 Clicks" Mobility   Outcome Measure  Help needed turning from your back to your side while in a flat bed without using bedrails?: A Little Help needed moving from lying on your back to sitting on the side of a flat bed without using bedrails?: A Little Help needed moving to and from a bed to a chair (including a wheelchair)?: A Little Help needed standing up from a chair using your arms (e.g., wheelchair or bedside chair)?: A Little Help needed to walk in hospital room?: A Lot Help needed climbing 3-5 steps with a railing? : A Lot 6 Click Score: 16    End of Session Equipment Utilized During Treatment: Gait belt Activity Tolerance: Patient limited by pain Patient left: in bed;with call bell/phone within reach;with bed alarm set Nurse Communication: Mobility status PT Visit Diagnosis: Other abnormalities of gait and mobility (R26.89);Muscle weakness (generalized) (M62.81);Pain Pain - Right/Left: Left Pain - part of body: Hip;Knee;Leg     Time:  1610-9604 PT Time Calculation (min) (ACUTE ONLY): 24 min  Charges:    $Therapeutic Exercise: 8-22 mins $Therapeutic Activity: 8-22 mins PT General Charges $$ ACUTE PT VISIT: 1 Visit                     Tori Calieb Lichtman PT, DPT 06/14/23, 2:26 PM

## 2023-06-14 NOTE — Progress Notes (Signed)
 Daily Progress Note   Patient Name: Valerie Kerr       Date: 06/14/2023 DOB: 1960-04-06  Age: 63 y.o. MRN#: [id number] Attending Physician: Valerie Art, DO Primary Care Physician: Valerie Register, MD Admit Date: 06/08/2023 Length of Stay: 5 days  Reason for Consultation/Follow-up: Establishing goals of care and Pain control  Subjective:   CC: Patient feels her pain continues to improve.She complains of nausea, she just came back from radiation.    Palliative medicine team following up regarding complex medical decision making and pain management.  Subjective:  Reviewed EMR prior to presenting to bedside.  Appears comfortable on current PO pain regimen, will not up titrate her PO Methadone at this time.    Objective:   Vital Signs:  BP (!) 99/57 (BP Location: Left Arm)   Pulse 91   Temp 98.2 F (36.8 C) (Oral)   Resp 18   Ht 5' 1.5" (1.562 m)   Wt 72.4 kg   SpO2 98%   BMI 29.67 kg/m   Physical Exam: General: NAD, alert, chronically ill-appearing Cardiovascular: RRR Respiratory: no increased work of breathing noted, not in respiratory distress Neuro: Alert, interactive  Imaging: I personally reviewed recent imaging.   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  Imaging upon presentation (as compared to PET from 03/2023) showed as seen previously large destructive mass involving the sacrum with extension into the sacroiliac joints at the disc of L5-S1 and potentially involvement of L5.  Noted associated pathologic fractures involving the sacrum and right transverse process of L5 which extended into the sacral space.  Oncology  consulted for recommendations.  Palliative medicine team consulted to assist with complex medical decision making and symptom management.   Recommendations/Plan: # Complex medical decision making/goals of care:                -Discussed care with patient as detailed above in HPI.  Discussed pathways for medical care moving forward.  With permission, expressed concern that if patient's functional status did not improve, would need to change pathway focused to comfort focused care with hospice support.  Patient notes she is motivated for PT/OT at this time and so wants to improve her functional status to possibly pursue further cancer directed therapies in the outpatient setting.  Acknowledged and noted palliative medicine team would continue to engage in conversation as able and appropriate.                -  Code Status: Full Code    # Symptom management Patient is receiving these palliative interventions for symptom management with an intent to improve quality of life.                 -Pain, severe in setting of stage IV leiomyosarcoma of the sacrum                               -Continue home dose of methadone 70 mg in a.m., 40 mg in afternoon, and 40 mg in the evening (every 8 hours apart). Patient receiving total of 150 mg of methadone in a day.  Appears comfortable on current doses, does admit  that pain is reasonably well controlled, will not do any further titration                                                -Personally ordered and reviewed EKG to monitor while patient receiving methadone and QTc noted to be 445 on 06/09/2023                               -Continue oxycodone to 30 mg every 4 hours as needed                               -Continue IV Dilaudid to 1-2 mg every 3 hours as needed for breakthrough pain only to be received after as needed oxycodone.  Realize this is lower as needed dose compared to other medications though recommend oral medication prior to IV.                                -Patient states that she cannot take neuropathic agents such as gabapentin, pregabalin, or duloxetine due to what she describes as "stomach discomfort". IV Zofran PRN, also on PO anti nausea medication.                   -Constipation, in setting of chronic opioid use                               -Change senna 2 tabs twice daily   -Continue MiraLAX 17 g daily   # Psycho-social/Spiritual Support:  - Support System: Daughters  # Discharge Planning: To Be Determined  Thank you for allowing the palliative care team to participate in the care Valerie Kerr.  Mod MDM Valerie Hawking MD Palliative Care Provider PMT # [phone]  If patient remains symptomatic despite maximum doses, please call PMT at [phone] between 0700 and 1900. Outside of these hours, please call attending, as PMT does not have night coverage.

## 2023-06-14 NOTE — Progress Notes (Signed)
 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  The patient presents to the hospital for uncontrolled pain.  Has not needed IV pain meds in > 2 days.  Await PT re-eval  Subjective:  Not using IV pain meds but does not want to go home  Assessment and Plan:    Cancer-related breakthrough pain - Currently on methadone, oxycodone  - The patient refuses to take dexamethasone because she thinks it is causing exacerbation of the pain - Palliative care team has also been contacted to help with symptom management - await PT eval     Metastatic leiomyosarcoma of bone (HCC) - Currently undergoing palliative radiation to the hip - Chemotherapy was scheduled for March 17-she has been missing appointments-she states this is because her pain is too severe - Oncology consulted  Dysuria - UA +- she grew E coli last time and was not treated-  Cefadroxil    Essential hypertension -lisinopril - HCTZ on hold    GERD (gastroesophageal reflux disease) - Receiving pantoprazole    Generalized anxiety disorder - She receives as needed Xanax for this as outpatient which has been resumed  Constipation - Senna resumed      Code Status: Full Code Total time on patient care: 35 minutes DVT prophylaxis:  enoxaparin (LOVENOX) injection 40 mg Start: 06/09/23 1000 SCDs Start: 06/08/23 2150 Place TED hose Start: 06/08/23 2150     Objective:   Vitals:   06/13/23 0405 06/13/23 1313 06/13/23 2206 06/14/23 0654  BP: 108/66 (!) 88/60 (!) 97/58 (!) 99/57  Pulse: 97 85 94 91  Resp: 17 14 18 18   Temp: 98.9 F (37.2 C) 98.3 F (36.8 C) 99.1 F (37.3 C) 98.2 F (36.8 C)  TempSrc: Oral Oral  Oral Oral  SpO2: 100% 91% 97% 98%  Weight:      Height:       Filed Weights   06/08/23 1552 06/09/23 0543  Weight: 71.7 kg 72.4 kg   Exam:   General: Appearance:     Overweight female in no acute distress     Lungs:     respirations unlabored  Heart:    Normal heart rate  MS:   All extremities are intact.   Neurologic:   Awake, alert.        CBC: Recent Labs  Lab 06/08/23 1555 06/09/23 0607  WBC 8.3 8.6  HGB 10.5* 10.8*  HCT 34.0* 36.5  MCV 89.5 91.3  PLT 296 291   Basic Metabolic Panel: Recent Labs  Lab 06/08/23 1555 06/09/23 0607  NA 137 137  K 3.7 3.9  CL 99 97*  CO2 29 29  GLUCOSE 127* 84  BUN 17 16  CREATININE 0.74 0.89  CALCIUM 8.9 9.2     Scheduled Meds:  calcium-vitamin D  1 tablet Oral BID WC   cefadroxil  500 mg Oral BID   docusate sodium  100 mg Oral BID   enoxaparin (LOVENOX) injection  40 mg Subcutaneous Q24H   feeding supplement  237 mL Oral BID BM   fluticasone  1 spray Each Nare Daily   lidocaine  2 patch Transdermal Q24H   loratadine  10 mg Oral Daily   methadone  70 mg Oral Daily   And   methadone  40 mg Oral Daily   And   methadone  40 mg Oral QHS   pantoprazole  40 mg Oral Daily   polyethylene glycol  17 g Oral Daily   senna  2 tablet Oral BID   sodium chloride flush  3 mL Intravenous Q12H   sodium chloride flush  3 mL Intravenous Q12H    Imaging and lab data personally reviewed   Author: Joseph Art  06/14/2023 11:59 AM  To contact [facility]>   Check the care team in [facility] and look for the attending/consulting [facility] provider listed  Log into [email or web address] and use [facility] universal password   Go to> "[facility]"  and find provider  If you still have difficulty reaching the provider, please page the [facility] (Director on Call) for the Hospitalists listed on amion

## 2023-06-14 NOTE — Progress Notes (Signed)
   06/14/23 1408  Spiritual Encounters  Type of Visit Initial  Care provided to: Patient  Reason for visit Routine spiritual support  OnCall Visit No   Visited with patient to discuss concerns and provide a spiritual assessment. Patient was happy to  engage in conversation. Patient reported this is her second bout with cancer. The first occurrence was discover at stage 2, however this time she was diagnosed with stage 4. Patient is positive and her faith sustains her. Patient discuss various health practice she has plan to engage in with her daughter, who she was visiting. Patient shared family dynamics ,and how her family and community is involved with her care plan.  Patient will begin treatment today and asked for a follow-up visit tomorrow after the radiation begins.  Visited ended with prayer.

## 2023-06-15 ENCOUNTER — Telehealth: Payer: Self-pay | Admitting: Radiation Oncology

## 2023-06-15 ENCOUNTER — Ambulatory Visit
Admission: RE | Admit: 2023-06-15 | Discharge: 2023-06-15 | Disposition: A | Discharge status: Home / Self Care | Source: Ambulatory Visit | Attending: Radiation Oncology | Admitting: Radiation Oncology

## 2023-06-15 ENCOUNTER — Other Ambulatory Visit: Payer: Self-pay

## 2023-06-15 DIAGNOSIS — G893 Neoplasm related pain (acute) (chronic): Secondary | ICD-10-CM | POA: Diagnosis not present

## 2023-06-15 LAB — RAD ONC ARIA SESSION SUMMARY
Course Elapsed Days: 28
Plan Fractions Treated to Date: 14
Plan Prescribed Dose Per Fraction: 2.5 Gy
Plan Total Fractions Prescribed: 15
Plan Total Prescribed Dose: 37.5 Gy
Reference Point Dosage Given to Date: 35 Gy
Reference Point Session Dosage Given: 2.5 Gy
Session Number: 14

## 2023-06-15 MED ORDER — HYDROMORPHONE HCL 1 MG/ML IJ SOLN: 1.0000 mg | INTRAMUSCULAR | Status: DC | PRN

## 2023-06-15 NOTE — Progress Notes (Signed)
 Daily Progress Note   Patient Name: Valerie Kerr       Date: 06/15/2023 DOB: 08/11/1960  Age: 63 y.o. MRN#: [id number] Attending Physician: Joseph Art, DO Primary Care Physician: Hoy Register, MD Admit Date: 06/08/2023 Length of Stay: 6 days  Reason for Consultation/Follow-up: Establishing goals of care and Pain control  Subjective:   CC: Patient complains of pain in her feet, we discussed about her pain regimen.     Palliative medicine team following up regarding complex medical decision making and pain management.  Subjective:  Reviewed EMR prior to presenting to bedside.  Appears comfortable on current PO pain regimen, will not up titrate her PO Methadone at this time.  Patient remains on oral methadone 70 mg and 40 mg daily as well as 40 mg at bedtime.  Additionally also on 30 mg of oxycodone available every 4 hours.  Patient has only required 3 doses of as needed oxycodone in the past 24 hours.   Objective:   Vital Signs:  BP 105/60 (BP Location: Left Arm)   Pulse 92   Temp 98.1 F (36.7 C) (Oral)   Resp 18   Ht 5' 1.5" (1.562 m)   Wt 72.4 kg   SpO2 93%   BMI 29.67 kg/m   Physical Exam: General: NAD, alert, chronically ill-appearing Cardiovascular: RRR Respiratory: no increased work of breathing noted, not in respiratory distress Neuro: Alert, interactive  Imaging: I personally reviewed recent imaging.   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  Imaging upon presentation (as compared to PET from 03/2023) showed as seen previously large destructive mass involving the sacrum with extension into the sacroiliac joints at  the disc of L5-S1 and potentially involvement of L5.  Noted associated pathologic fractures involving the sacrum and right transverse process of L5 which extended into the sacral space.  Oncology consulted for recommendations.  Palliative medicine team consulted to assist with complex medical decision making and symptom management.   Recommendations/Plan: # Complex medical decision making/goals of care:                -Discussed care with patient as detailed above in HPI.  Discussed pathways for medical care moving forward.  With permission, expressed concern that if patient's functional status did not improve, would need to change pathway focused to comfort focused care with hospice support.  Patient notes she is motivated for PT/OT at this time and so wants to improve her functional status to possibly pursue further cancer directed therapies in the outpatient setting.  Acknowledged and noted palliative medicine team would continue to engage in conversation as able and appropriate.                -  Code Status: Full Code    # Symptom management Patient is receiving these palliative interventions for symptom management with an intent to improve quality of life.                 -Pain, severe in setting of stage IV leiomyosarcoma of the sacrum                               -  Continue home dose of methadone 70 mg in a.m., 40 mg in afternoon, and 40 mg in the evening (every 8 hours apart). Patient receiving total of 150 mg of methadone in a day.  Appears comfortable on current doses, does admit that pain is reasonably well controlled, will not do any further titration patient has been encouraged to ask for and receive as needed oxycodone 30 mg because it is available every 4 hours on an as-needed basis.                                              -Personally ordered and reviewed EKG to monitor while patient receiving methadone and QTc noted to be 445 on 06/09/2023                               -Continue  oxycodone to 30 mg every 4 hours as needed                               -Continue IV Dilaudid to 1-2 mg every 3 hours as needed for breakthrough pain only to be received after as needed oxycodone.  Realize this is lower as needed dose compared to other medications though recommend oral medication prior to IV.                               -Patient states that she cannot take neuropathic agents such as gabapentin, pregabalin, or duloxetine due to what she describes as "stomach discomfort". IV Zofran PRN, also on PO anti nausea medication.                   -Constipation, in setting of chronic opioid use                               -Change senna 2 tabs twice daily   -Continue MiraLAX 17 g daily   # Psycho-social/Spiritual Support:  - Support System: Daughters  # Discharge Planning: To Be Determined  Thank you for allowing the palliative care team to participate in the care [name].  Mod MDM [name] MD Palliative Care Provider PMT # [phone]  If patient remains symptomatic despite maximum doses, please call PMT at [phone] between 0700 and 1900. Outside of these hours, please call attending, as PMT does not have night coverage.

## 2023-06-15 NOTE — Progress Notes (Signed)
 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  The patient presents to the hospital for uncontrolled pain.  Has not needed IV pain meds in > 2 days.  Await PT re-eval-- Radiation appears to be done on Monday so will plan d/c after that.  Subjective:  Not using IV pain meds but does not want to go home until after radiation treatment-- says she is not able to walk  Assessment and Plan:    Cancer-related breakthrough pain - Currently on methadone, oxycodone  - The patient refuses to take dexamethasone because she thinks it is causing exacerbation of the pain - Palliative care team has also been contacted to help with symptom management - await PT eval     Metastatic leiomyosarcoma of bone (HCC) - Currently undergoing palliative radiation to the hip - Chemotherapy was scheduled for March 17-she has been missing appointments-she states this is because her pain is too severe - Oncology consulted  Dysuria - UA +- she grew E coli last time and was not treated-  Cefadroxil    Essential hypertension -lisinopril/HCTZ on hold due to hypotension    GERD (gastroesophageal reflux disease) - Receiving pantoprazole    Generalized anxiety disorder - She receives as needed Xanax for this as outpatient which has been resumed  Constipation - Senna resumed      Code Status: Full Code Total time on patient care: 35 minutes DVT prophylaxis:  enoxaparin (LOVENOX) injection 40 mg Start: 06/09/23 1000 SCDs Start: 06/08/23 2150 Place TED hose Start: 06/08/23 2150     Objective:   Vitals:   06/13/23 2206 06/14/23 0654 06/14/23 2217 06/15/23 0452  BP: (!) 97/58 (!) 99/57 111/74  (!) 91/54  Pulse: 94 91 (!) 102 91  Resp: 18 18 18 18   Temp: 99.1 F (37.3 C) 98.2 F (36.8 C) 98.7 F (37.1 C) 98.6 F (37 C)  TempSrc: Oral Oral Oral Oral  SpO2: 97% 98% 98% 92%  Weight:      Height:       Filed Weights   06/08/23 1552 06/09/23 0543  Weight: 71.7 kg 72.4 kg   Exam:    General: Appearance:     Overweight female in no acute distress     Lungs:     respirations unlabored  Heart:    Normal heart rate.  MS:   All extremities are intact.   Neurologic:   Awake, alert       CBC: Recent Labs  Lab 06/08/23 1555 06/09/23 0607  WBC 8.3 8.6  HGB 10.5* 10.8*  HCT 34.0* 36.5  MCV 89.5 91.3  PLT 296 291   Basic Metabolic Panel: Recent Labs  Lab 06/08/23 1555 06/09/23 0607  NA 137 137  K 3.7 3.9  CL 99 97*  CO2 29 29  GLUCOSE 127* 84  BUN 17 16  CREATININE 0.74 0.89  CALCIUM 8.9 9.2     Scheduled Meds:  calcium-vitamin D  1 tablet Oral BID WC   cefadroxil  500 mg Oral BID   docusate sodium  100 mg Oral BID   enoxaparin (LOVENOX) injection  40 mg Subcutaneous Q24H   feeding supplement  237 mL Oral BID BM  fluticasone  1 spray Each Nare Daily   lidocaine  2 patch Transdermal Q24H   loratadine  10 mg Oral Daily   methadone  70 mg Oral Daily   And   methadone  40 mg Oral Daily   And   methadone  40 mg Oral QHS   pantoprazole  40 mg Oral Daily   polyethylene glycol  17 g Oral Daily   senna  2 tablet Oral BID   sodium chloride flush  3 mL Intravenous Q12H   sodium chloride flush  3 mL Intravenous Q12H    Imaging and lab data personally reviewed   Author: Joseph Art  06/15/2023 10:32 AM  To contact [facility]>   Check the care team in [facility] and look for the attending/consulting [facility] provider listed  Log into [email or web address] and use [facility]'s universal password   Go to> "[facility]"  and find provider  If you still have difficulty reaching the provider, please page the [facility] (Director on Call) for the Hospitalists  listed on amion

## 2023-06-15 NOTE — Plan of Care (Signed)

## 2023-06-15 NOTE — Telephone Encounter (Signed)
 3/20 @ 2:19 pm Left voicemail to confirm follow up appointment for 4/21.  Waiting on call back.

## 2023-06-16 ENCOUNTER — Other Ambulatory Visit: Payer: Self-pay

## 2023-06-16 ENCOUNTER — Ambulatory Visit
Admission: RE | Admit: 2023-06-16 | Discharge: 2023-06-16 | Disposition: A | Discharge status: Home / Self Care | Source: Ambulatory Visit | Attending: Radiation Oncology | Admitting: Radiation Oncology

## 2023-06-16 DIAGNOSIS — G893 Neoplasm related pain (acute) (chronic): Secondary | ICD-10-CM | POA: Diagnosis not present

## 2023-06-16 LAB — RAD ONC ARIA SESSION SUMMARY
Course Elapsed Days: 29
Plan Fractions Treated to Date: 15
Plan Prescribed Dose Per Fraction: 2.5 Gy
Plan Total Fractions Prescribed: 15
Plan Total Prescribed Dose: 37.5 Gy
Reference Point Dosage Given to Date: 37.5 Gy
Reference Point Session Dosage Given: 2.5 Gy
Session Number: 15

## 2023-06-16 MED ORDER — DICLOFENAC SODIUM 1 % EX GEL
2.0000 g | Freq: Four times a day (QID) | CUTANEOUS | Status: DC
  Administered 2023-06-16 – 2023-06-19 (×8): 2 g via TOPICAL
  Filled 2023-06-16: qty 100

## 2023-06-16 NOTE — Plan of Care (Signed)

## 2023-06-16 NOTE — Progress Notes (Signed)
 Physical Therapy Treatment Patient Details Name: Valerie Kerr MRN: [id number] DOB: [birth year] Today's Date: 06/16/2023   History of Present Illness 63 yo female presents to the hospital for uncontrolled pain. CT 3/13: As seen on prior PET-CT there is a large destructive mass involving  the sacrum with extension to the sacroiliac joints, the at the disc  at L5-S1 and potentially involvement of L5. Associated pathologic  fractures involving the sacrum and the right transverse process of  L5. This extends into the presacral space.  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    PT Comments  Pt agreeable to therapy, reports has been wanting to get up all day. Pt completes supine<>sit with bedrail and supv, self mobilizes BLE to EOB and uprights trunk with increased time and effort. Once seated EOB, pt declining gait belt use, heavy education regarding safety and weakness and pt agreeable. Pt reporting 10/10 L foot pain at medial arch, rocking backwards while seated EOB due to high pain, unable to power up or put weight through L foot. Pt returns to supine and able to reposition self in supine to comfort; NT in room due to pt requesting bedpan. Educated pt on time OOB with nursing as able; notified MD and LPN regarding L foot pain complaints.   If plan is discharge home, recommend the following: A little help with walking and/or transfers;A little help with bathing/dressing/bathroom;Assistance with cooking/housework;Assist for transportation   Can travel by private vehicle        Equipment Recommendations  None recommended by PT    Recommendations for Other Services       Precautions / Restrictions Precautions Precautions: Fall Restrictions Weight Bearing Restrictions Per Provider Order: No     Mobility  Bed Mobility Overal bed mobility: Needs  Assistance Bed Mobility: Supine to Sit, Sit to Supine     Supine to sit: Supervision, HOB elevated, Used rails Sit to supine: Supervision, HOB elevated, Used rails   General bed mobility comments: pt declines physical assist "I don't want to be pulled on", pt pulling on bedrails and using elevated HOB to assist in mobilizing to EOB, able to inch BLE to EOB wtih increased time and efofrt    Transfers                   General transfer comment: pt unable, educated on use of RW and pushing from seated surface, pt requesting therapist not hold gait belt, heavy education regarding weakness and safety so pt in agreement; pt unable to power up to standing due to L medial foot pain    Ambulation/Gait                   Stairs             Wheelchair Mobility     Tilt Bed    Modified Rankin (Stroke Patients Only)       Balance Overall balance assessment: Mild deficits observed, not formally tested                                          Communication Communication Communication: No apparent difficulties  Cognition Arousal: Alert Behavior During Therapy: WFL for tasks assessed/performed   PT - Cognitive impairments: No apparent impairments  Following commands: Intact      Cueing    Exercises      General Comments        Pertinent Vitals/Pain Pain Assessment Pain Assessment: 0-10 Pain Score: 10-Worst pain ever Pain Location: L foot, posterior BLE, L buttock Pain Descriptors / Indicators: Sharp, Stabbing, Grimacing, Guarding Pain Intervention(s): Limited activity within patient's tolerance, Monitored during session, Premedicated before session, Repositioned    Home Living                          Prior Function            PT Goals (current goals can now be found in the care plan section) Acute Rehab PT Goals Patient Stated Goal: return home with daughters assisting PT Goal  Formulation: With patient Time For Goal Achievement: 06/23/23 Potential to Achieve Goals: Good Progress towards PT goals: Progressing toward goals (slowly, limited by pain)    Frequency    Min 2X/week      PT Plan      Co-evaluation              AM-PAC PT "6 Clicks" Mobility   Outcome Measure  Help needed turning from your back to your side while in a flat bed without using bedrails?: A Little Help needed moving from lying on your back to sitting on the side of a flat bed without using bedrails?: A Little Help needed moving to and from a bed to a chair (including a wheelchair)?: A Little Help needed standing up from a chair using your arms (e.g., wheelchair or bedside chair)?: A Little Help needed to walk in hospital room?: A Lot Help needed climbing 3-5 steps with a railing? : A Lot 6 Click Score: 16    End of Session Equipment Utilized During Treatment: Gait belt Activity Tolerance: Patient limited by pain Patient left: in bed;with call bell/phone within reach;Other (comment) (on bed pan, pt with call bell) Nurse Communication: Mobility status;Other (comment) (L foot pain to MD and LPN) PT Visit Diagnosis: Other abnormalities of gait and mobility (R26.89);Muscle weakness (generalized) (M62.81);Pain Pain - Right/Left: Left Pain - part of body: Hip;Knee;Leg     Time: 0981-1914 PT Time Calculation (min) (ACUTE ONLY): 20 min  Charges:    $Therapeutic Activity: 8-22 mins PT General Charges $$ ACUTE PT VISIT: 1 Visit                     Tori Skyleigh Windle PT, DPT 06/16/23, 3:02 PM

## 2023-06-16 NOTE — Progress Notes (Signed)
 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  The patient presents to the hospital for uncontrolled pain.  Has not needed IV pain meds in > 2 days.  Await PT re-eval-- Radiation appears to be done on Monday so will plan d/c after that.  Subjective:  If PT comes, agreeable to walk Did not sleep well last night  Assessment and Plan:    Cancer-related breakthrough pain - Currently on methadone, oxycodone  - The patient refuses to take dexamethasone because she thinks it is causing exacerbation of the pain - Palliative care team has also been contacted to help with symptom management - await PT eval     Metastatic leiomyosarcoma of bone (HCC) - Currently undergoing palliative radiation to the hip - Chemotherapy was scheduled for March 17-she has been missing appointments-she states this is because her pain is too severe - Oncology consulted  Dysuria - UA +- she grew E coli last time and was not treated-  Cefadroxil    Essential hypertension -lisinopril/HCTZ on hold due to hypotension    GERD (gastroesophageal reflux disease) - Receiving pantoprazole    Generalized anxiety disorder - She receives as needed Xanax for this as outpatient which has been resumed  Constipation - Senna resumed      Code Status: Full Code Total time on patient care: 35 minutes DVT prophylaxis:  enoxaparin (LOVENOX) injection 40 mg Start: 06/09/23 1000 SCDs Start: 06/08/23 2150 Place TED hose Start: 06/08/23 2150   D/c after radiation finishes?   Objective:   Vitals:   06/15/23 1346 06/15/23 2036 06/16/23 0430 06/16/23 1405  BP: 105/60 104/75 121/64 129/74  Pulse: 92 (!) 105 94  94  Resp: 18 18 18 18   Temp: 98.1 F (36.7 C) 98.6 F (37 C) 98.8 F (37.1 C) 99 F (37.2 C)  TempSrc: Oral Oral Oral Oral  SpO2: 93% 98% 99% 94%  Weight:      Height:       Filed Weights   06/08/23 1552 06/09/23 0543  Weight: 71.7 kg 72.4 kg   Exam:    General: Appearance:     Overweight female in no acute distress     Lungs:     respirations unlabored  Heart:    Normal heart rate.  MS:   All extremities are intact.   Neurologic:   Awake, alert       CBC: No results for input(s): "WBC", "NEUTROABS", "HGB", "HCT", "MCV", "PLT" in the last 168 hours.  Basic Metabolic Panel: No results for input(s): "NA", "K", "CL", "CO2", "GLUCOSE", "BUN", "CREATININE", "CALCIUM", "MG", "PHOS" in the last 168 hours.    Scheduled Meds:  calcium-vitamin D  1 tablet Oral BID WC   cefadroxil  500 mg Oral BID   docusate sodium  100 mg Oral BID   enoxaparin (LOVENOX) injection  40 mg Subcutaneous Q24H   feeding supplement  237 mL Oral BID BM   fluticasone  1 spray Each Nare Daily   lidocaine  2 patch Transdermal Q24H   loratadine  10 mg Oral Daily   methadone  70 mg Oral Daily   And   methadone  40 mg Oral Daily   And  methadone  40 mg Oral QHS   pantoprazole  40 mg Oral Daily   polyethylene glycol  17 g Oral Daily   senna  2 tablet Oral BID   sodium chloride flush  3 mL Intravenous Q12H   sodium chloride flush  3 mL Intravenous Q12H    Imaging and lab data personally reviewed   Author: Joseph Art  06/16/2023 2:17 PM  To contact [facility]>   Check the care team in [facility] and look for the attending/consulting [facility] provider listed  Log into [email or web address] and use [place]'s universal password   Go to> "[facility]"  and find provider  If you still have difficulty reaching the provider, please page the [facility] (Director on Call) for the Hospitalists listed on amion

## 2023-06-17 DIAGNOSIS — G893 Neoplasm related pain (acute) (chronic): Secondary | ICD-10-CM | POA: Diagnosis not present

## 2023-06-17 MED ORDER — MENTHOL 3 MG MT LOZG
1.0000 | LOZENGE | OROMUCOSAL | Status: DC | PRN
  Administered 2023-06-17: 3 mg via ORAL
  Filled 2023-06-17: qty 9

## 2023-06-17 NOTE — Progress Notes (Signed)
 Daily Progress Note   Patient Name: Valerie Kerr       Date: 06/17/2023 DOB: 06-16-1960  Age: 63 y.o. MRN#: [id number] Attending Physician: Joseph Art, DO Primary Care Physician: Hoy Register, MD Admit Date: 06/08/2023 Length of Stay: 8 days  Reason for Consultation/Follow-up: Establishing goals of care and Pain control  Subjective:   CC: Patient resting in bed, continues to appear weak. No family at bedside, medication history reviewed.      Palliative medicine team following up regarding complex medical decision making and pain management.  Subjective:  Reviewed EMR prior to presenting to bedside.    Objective:   Vital Signs:  BP 115/61 (BP Location: Left Arm)   Pulse 89   Temp 98 F (36.7 C) (Oral)   Resp 20   Ht 5' 1.5" (1.562 m)   Wt 72.4 kg   SpO2 98%   BMI 29.67 kg/m   Physical Exam: General: NAD, alert, chronically ill-appearing Cardiovascular: RRR Respiratory: no increased work of breathing noted, not in respiratory distress Neuro: Asleep appears comfortable.   Imaging: I personally reviewed recent imaging.   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  Imaging upon presentation (as compared to PET from 03/2023) showed as seen previously large destructive mass involving the sacrum with extension into the sacroiliac joints at the disc of L5-S1 and potentially involvement of L5.  Noted associated pathologic fractures involving the sacrum and right transverse process of L5 which extended into the sacral space.  Oncology consulted for recommendations.  Palliative medicine team consulted to assist with complex medical  decision making and symptom management.   Recommendations/Plan: # Complex medical decision making/goals of care:                -Discussed care with patient as detailed above in HPI.  Discussed pathways for medical care moving forward.  With permission, expressed concern that if patient's functional status did not improve, would need to change pathway focused to comfort focused care with hospice support.  Patient notes she is motivated for PT/OT at this time and so wants to improve her functional status to possibly pursue further cancer directed therapies in the outpatient setting.  Acknowledged and noted palliative medicine team would continue to engage in conversation as able and appropriate.                -  Code Status: Full Code    # Symptom management Patient is receiving these palliative interventions for symptom management with an intent to improve quality of life.                 -Pain, severe in setting of stage IV leiomyosarcoma of the sacrum                               -Continue home dose of methadone 70 mg in a.m., 40 mg in afternoon, and 40 mg in the evening (every 8 hours apart). Patient receiving total of 150 mg of methadone in a day.  Appears comfortable on current doses, does admit that pain is reasonably well controlled, will not do any further titration patient has been encouraged  to ask for and receive as needed oxycodone 30 mg because it is available every 4 hours on an as-needed basis.                                              -Personally ordered and reviewed EKG to monitor while patient receiving methadone and QTc noted to be 445 on 06/09/2023                               -Continue oxycodone to 30 mg every 4 hours as needed                                                               -Patient states that she cannot take neuropathic agents such as gabapentin, pregabalin, or duloxetine due to what she describes as "stomach discomfort". IV Zofran PRN, also on PO anti nausea  medication.                   -Constipation, in setting of chronic opioid use                               -Change senna 2 tabs twice daily   -Continue MiraLAX 17 g daily   # Psycho-social/Spiritual Support:  - Support System: Daughters  # Discharge Planning: To Be Determined  Thank you for allowing the palliative care team to participate in the care Valerie Kerr.  low MDM Valerie Hawking MD Palliative Care Provider PMT # [phone]  If patient remains symptomatic despite maximum doses, please call PMT at [phone] between 0700 and 1900. Outside of these hours, please call attending, as PMT does not have night coverage.

## 2023-06-17 NOTE — Progress Notes (Signed)
 Mobility Specialist - Progress Note   06/17/23 1310  Mobility  Activity Dangled on edge of bed  Level of Assistance Modified independent, requires aide device or extra time  Activity Response Tolerated well  Mobility Referral Yes  Mobility visit 1 Mobility  Mobility Specialist Start Time (ACUTE ONLY) 1301  Mobility Specialist Stop Time (ACUTE ONLY) 1309  Mobility Specialist Time Calculation (min) (ACUTE ONLY) 8 min   Pt received in bed and agreeable to sit EOB. Pt tolerated sitting EOB for ~42min. C/o leg pain during session. Pt to bed after session with all needs met.    [facility]

## 2023-06-17 NOTE — Plan of Care (Signed)
   Problem: Education: Goal: Knowledge of General Education information will improve Description Including pain rating scale, medication(s)/side effects and non-pharmacologic comfort measures Outcome: Progressing   Problem: Health Behavior/Discharge Planning: Goal: Ability to manage health-related needs will improve Outcome: Progressing

## 2023-06-17 NOTE — Progress Notes (Signed)
 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  The patient presents to the hospital for uncontrolled pain.  Has not needed IV pain meds in > 2 days.  Await PT re-eval-- Radiation appears to be done on Monday so will plan d/c after that.  Subjective:  C/o pain in left foot arch  Assessment and Plan:    Cancer-related breakthrough pain - Currently on methadone, oxycodone  - The patient refuses to take dexamethasone because she thinks it is causing exacerbation of the pain - Palliative care team has also been contacted to help with symptom management - await PT eval     Metastatic leiomyosarcoma of bone (HCC) - Currently undergoing palliative radiation to the hip - Chemotherapy was scheduled for March 17-she has been missing appointments-she states this is because her pain is too severe - Oncology consulted  Dysuria - UA +- she grew E coli last time and was not treated-  Cefadroxil    Essential hypertension -lisinopril/HCTZ on hold due to hypotension    GERD (gastroesophageal reflux disease) - Receiving pantoprazole    Generalized anxiety disorder - She receives as needed Xanax for this as outpatient which has been resumed  Constipation - Senna resumed      Code Status: Full Code Total time on patient care: 35 minutes DVT prophylaxis:  enoxaparin (LOVENOX) injection 40 mg Start: 06/09/23 1000 SCDs Start: 06/08/23 2150 Place TED hose Start: 06/08/23 2150   D/c after radiation finishes?   Objective:   Vitals:   06/16/23 0430 06/16/23 1405 06/16/23 2003 06/17/23 0536  BP: 121/64 129/74 114/61 115/61  Pulse: 94 94 97 89  Resp: 18 18 18 20   Temp: 98.8 F  (37.1 C) 99 F (37.2 C) 98.6 F (37 C) 98 F (36.7 C)  TempSrc: Oral Oral Oral Oral  SpO2: 99% 94% 99% 98%  Weight:      Height:       Filed Weights   06/08/23 1552 06/09/23 0543  Weight: 71.7 kg 72.4 kg   Exam:    General: Appearance:     Overweight female in no acute distress     Lungs:     respirations unlabored  Heart:    Normal heart rate.  MS:   All extremities are intact.   Neurologic:   Awake, alert       CBC: No results for input(s): "WBC", "NEUTROABS", "HGB", "HCT", "MCV", "PLT" in the last 168 hours.  Basic Metabolic Panel: No results for input(s): "NA", "K", "CL", "CO2", "GLUCOSE", "BUN", "CREATININE", "CALCIUM", "MG", "PHOS" in the last 168 hours.    Scheduled Meds:  calcium-vitamin D  1 tablet Oral BID WC   cefadroxil  500 mg Oral BID   diclofenac Sodium  2 g Topical QID   docusate sodium  100 mg Oral BID   enoxaparin (LOVENOX) injection  40 mg Subcutaneous Q24H   feeding supplement  237 mL Oral BID BM   fluticasone  1 spray Each Nare Daily   lidocaine  2 patch Transdermal Q24H   loratadine  10 mg Oral Daily   methadone  70 mg Oral Daily   And   methadone  40 mg Oral Daily  And   methadone  40 mg Oral QHS   pantoprazole  40 mg Oral Daily   polyethylene glycol  17 g Oral Daily   senna  2 tablet Oral BID   sodium chloride flush  3 mL Intravenous Q12H   sodium chloride flush  3 mL Intravenous Q12H    Imaging and lab data personally reviewed   Author: Joseph Art  06/17/2023 12:59 PM  To contact [facility]>   Check the care team in [facility] and look for the attending/consulting [facility] provider listed  Log into [email or web address] and use [place]'s universal password   Go to> "[facility]"  and find provider  If you still have difficulty reaching the provider, please page the [facility] (Director on Call) for the Hospitalists listed on amion

## 2023-06-18 DIAGNOSIS — G893 Neoplasm related pain (acute) (chronic): Secondary | ICD-10-CM | POA: Diagnosis not present

## 2023-06-18 NOTE — Plan of Care (Signed)

## 2023-06-18 NOTE — Plan of Care (Signed)

## 2023-06-18 NOTE — Progress Notes (Signed)
 Daily Progress Note   Patient Name: Valerie Kerr       Date: 06/18/2023 DOB: Dec 12, 1960  Age: 63 y.o. MRN#: [id number] Attending Physician: Joseph Art, DO Primary Care Physician: Hoy Register, MD Admit Date: 06/08/2023 Length of Stay: 9 days  Reason for Consultation/Follow-up: Establishing goals of care and Pain control  Subjective:   CC: Patient resting in bed, continues to appear weak. No family at bedside, medication history reviewed.      Palliative medicine team following up regarding complex medical decision making and pain management.  Subjective:  Reviewed EMR prior to presenting to bedside.    Objective:   Vital Signs:  BP (!) 100/55 (BP Location: Right Arm)   Pulse 95   Temp 98.9 F (37.2 C) (Oral)   Resp 18   Ht 5' 1.5" (1.562 m)   Wt 72.4 kg   SpO2 99%   BMI 29.67 kg/m   Physical Exam: General: NAD, alert, chronically ill-appearing Cardiovascular: RRR Respiratory: no increased work of breathing noted, not in respiratory distress Neuro: Asleep appears comfortable.   Imaging: I personally reviewed recent imaging.   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  Imaging upon presentation (as compared to PET from 03/2023) showed as seen previously large destructive mass involving the sacrum with extension into the sacroiliac joints at the disc of L5-S1 and potentially involvement of L5.  Noted associated pathologic fractures involving the sacrum and right transverse process of L5 which extended into the sacral space.  Oncology consulted for recommendations.  Palliative medicine team consulted to assist with complex  medical decision making and symptom management.   Recommendations/Plan: # Complex medical decision making/goals of care:                -Discussed care with patient as detailed above in HPI.  Discussed pathways for medical care moving forward.  With permission, expressed concern that if patient's functional status did not improve, would need to change pathway focused to comfort focused care with hospice support.  Patient notes she is motivated for PT/OT at this time and so wants to improve her functional status to possibly pursue further cancer directed therapies in the outpatient setting.  Acknowledged and noted palliative medicine team would continue to engage in conversation as able and appropriate.                -  Code Status: Full Code    # Symptom management Patient is receiving these palliative interventions for symptom management with an intent to improve quality of life.                 -Pain, severe in setting of stage IV leiomyosarcoma of the sacrum                               -Continue home dose of methadone 70 mg in a.m., 40 mg in afternoon, and 40 mg in the evening (every 8 hours apart). Patient receiving total of 150 mg of methadone in a day.  Appears comfortable on current doses, does admit that pain is reasonably well controlled, will not do any further titration patient has been  encouraged to ask for and receive as needed oxycodone 30 mg because it is available every 4 hours on an as-needed basis.                                              -Personally ordered and reviewed EKG to monitor while patient receiving methadone and QTc noted to be 445 on 06/09/2023                               -Continue oxycodone to 30 mg every 4 hours as needed                                                               -Patient states that she cannot take neuropathic agents such as gabapentin, pregabalin, or duloxetine due to what she describes as "stomach discomfort". IV Zofran PRN, also on PO anti  nausea medication.                   -Constipation, in setting of chronic opioid use                               -Change senna 2 tabs twice daily   -Continue MiraLAX 17 g daily   # Psycho-social/Spiritual Support:  - Support System: Daughters  # Discharge Planning: To Be Determined Discussed with [facility] colleague, recommend discharge home soon, recommend outpatient palliative follow up at [facility].   Thank you for allowing the palliative care team to participate in the care Valerie Kerr.  low MDM [name] MD Palliative Care Provider PMT # [phone]  If patient remains symptomatic despite maximum doses, please call PMT at [phone] between 0700 and 1900. Outside of these hours, please call attending, as PMT does not have night coverage.

## 2023-06-18 NOTE — Progress Notes (Signed)
 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  The patient presents to the hospital for uncontrolled pain.  Has not needed IV pain meds in > 2 days.  Await PT re-eval-- Radiation appears to be done on Monday so will plan d/c after that.  Subjective:  Has not seen PT to get out of bed-- did sit on side  Assessment and Plan:    Cancer-related breakthrough pain - Currently on methadone, oxycodone  - The patient refuses to take dexamethasone because she thinks it is causing exacerbation of the pain - Palliative care team has also been contacted to help with symptom management - await PT eval     Metastatic leiomyosarcoma of bone (HCC) - Currently undergoing palliative radiation to the hip-- finished Friday - Chemotherapy was scheduled for March 17-she has been missing appointments-she states this is because her pain is too severe - Oncology consulted  Dysuria - UA +- she grew E coli last time and was not treated-  Cefadroxil    Essential hypertension -lisinopril/HCTZ on hold due to hypotension    GERD (gastroesophageal reflux disease) - Receiving pantoprazole    Generalized anxiety disorder - She receives as needed Xanax for this as outpatient which has been resumed  Constipation - Senna resumed      Code Status: Full Code Total time on patient care: 35 minutes DVT prophylaxis:  enoxaparin (LOVENOX) injection 40 mg Start: 06/09/23 1000 SCDs Start: 06/08/23 2150 Place TED hose Start: 06/08/23 2150   D/c soon-- needs OOB and walking   Objective:   Vitals:   06/17/23 1456 06/17/23 2030 06/18/23 0435 06/18/23 0824  BP: 97/62 114/64 100/65 (!) 100/55  Pulse:  94 91 91 95  Resp:  17 18 18   Temp:  98.7 F (37.1 C) 98.6 F (37 C) 98.9 F (37.2 C)  TempSrc:  Oral Oral Oral  SpO2:  96% 96% 99%  Weight:      Height:       Filed Weights   06/08/23 1552 06/09/23 0543  Weight: 71.7 kg 72.4 kg   Exam:    General: Appearance:     Overweight female in no acute distress     Lungs:     respirations unlabored  Heart:    Normal heart rate.  MS:   All extremities are intact.   Neurologic:   Awake, alert       CBC: No results for input(s): "WBC", "NEUTROABS", "HGB", "HCT", "MCV", "PLT" in the last 168 hours.  Basic Metabolic Panel: No results for input(s): "NA", "K", "CL", "CO2", "GLUCOSE", "BUN", "CREATININE", "CALCIUM", "MG", "PHOS" in the last 168 hours.    Scheduled Meds:  calcium-vitamin D  1 tablet Oral BID WC   cefadroxil  500 mg Oral BID   diclofenac Sodium  2 g Topical QID   docusate sodium  100 mg Oral BID   enoxaparin (LOVENOX) injection  40 mg Subcutaneous Q24H   feeding supplement  237 mL Oral BID BM   fluticasone  1 spray Each Nare Daily   lidocaine  2 patch Transdermal Q24H   loratadine  10 mg Oral Daily   methadone  70 mg Oral Daily   And  methadone  40 mg Oral Daily   And   methadone  40 mg Oral QHS   pantoprazole  40 mg Oral Daily   polyethylene glycol  17 g Oral Daily   senna  2 tablet Oral BID   sodium chloride flush  3 mL Intravenous Q12H   sodium chloride flush  3 mL Intravenous Q12H    Imaging and lab data personally reviewed   Author: Joseph Art  06/18/2023 12:02 PM  To contact [facility]>   Check the care team in [facility] and look for the attending/consulting [facility] provider listed  Log into [email or web address] and use [facility] universal password   Go to> "[facility]"  and find provider  If you still have difficulty reaching the provider, please page the [facility] (Director on Call) for the Hospitalists listed on amion

## 2023-06-19 ENCOUNTER — Encounter: Payer: Self-pay | Admitting: Hematology and Oncology

## 2023-06-19 ENCOUNTER — Other Ambulatory Visit: Payer: Self-pay | Admitting: Hematology and Oncology

## 2023-06-19 ENCOUNTER — Other Ambulatory Visit (HOSPITAL_COMMUNITY): Payer: Self-pay

## 2023-06-19 DIAGNOSIS — G893 Neoplasm related pain (acute) (chronic): Secondary | ICD-10-CM | POA: Diagnosis not present

## 2023-06-19 MED ORDER — METHADONE HCL 10 MG PO TABS
ORAL_TABLET | ORAL | 0 refills | Status: DC
  Filled 2023-06-19: qty 120, 8d supply, fill #0

## 2023-06-19 MED ORDER — OYSTER SHELL CALCIUM/D3 500-5 MG-MCG PO TABS
1.0000 | ORAL_TABLET | Freq: Two times a day (BID) | ORAL | 0 refills | Status: DC
  Filled 2023-06-19: qty 60, 30d supply, fill #0

## 2023-06-19 MED ORDER — IBUPROFEN 400 MG PO TABS: 400.0000 mg | ORAL_TABLET | Freq: Four times a day (QID) | ORAL | Status: DC | PRN

## 2023-06-19 MED ORDER — DOCUSATE SODIUM 100 MG PO CAPS: 100.0000 mg | ORAL_CAPSULE | Freq: Two times a day (BID) | ORAL | Status: DC

## 2023-06-19 MED ORDER — POLYETHYLENE GLYCOL 3350 17 G PO PACK: 17.0000 g | PACK | Freq: Every day | ORAL | Status: DC

## 2023-06-19 NOTE — Progress Notes (Signed)
 Daily Progress Note   Patient Name: Valerie Kerr       Date: 06/19/2023 DOB: [birth year]  Age: 63 y.o. MRN#: [id number] Attending Physician: Joseph Art, DO Primary Care Physician: Hoy Register, MD Admit Date: 06/08/2023 Length of Stay: 10 days  Reason for Consultation/Follow-up: Establishing goals of care and Pain control  Subjective:   CC: Patient resting in bed, continues to appear weak. No family at bedside, medication history reviewed.      Palliative medicine team following up regarding complex medical decision making and pain management.  Subjective: Resting in bed, PT note from earlier today reviewed.  Reviewed EMR prior to presenting to bedside.    Objective:   Vital Signs:  BP 115/68 (BP Location: Left Arm)   Pulse 93   Temp 98.5 F (36.9 C) (Oral)   Resp 18   Ht 5' 1.5" (1.562 m)   Wt 72.4 kg   SpO2 92%   BMI 29.67 kg/m   Physical Exam: General: NAD, alert, chronically ill-appearing Cardiovascular: RRR Respiratory: no increased work of breathing noted, not in respiratory distress Neuro: Asleep appears comfortable.   Imaging: I personally reviewed recent imaging.   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  Imaging upon presentation (as compared to PET from 03/2023) showed as seen previously large destructive mass involving the sacrum with extension into the sacroiliac joints at the disc of L5-S1 and potentially involvement of L5.  Noted associated pathologic fractures involving the sacrum and right transverse process of L5 which extended into the sacral space.  Oncology consulted for recommendations.  Palliative  medicine team consulted to assist with complex medical decision making and symptom management.   Recommendations/Plan: # Complex medical decision making/goals of care:                -Discussed care with patient as detailed above in HPI.  Discussed pathways for medical care moving forward.  With permission, expressed concern that if patient's functional status did not improve, would need to change pathway focused to comfort focused care with hospice support.  Patient notes she is motivated for PT/OT at this time and so wants to improve her functional status to possibly pursue further cancer directed therapies in the outpatient setting.  Acknowledged and noted palliative medicine team would continue to engage in conversation as able and appropriate.                -  Code Status: Full Code    # Symptom management Patient is receiving these palliative interventions for symptom management with an intent to improve quality of life.                 -Pain, severe in setting of stage IV leiomyosarcoma of the sacrum                               -Continue home dose of methadone 70 mg in a.m., 40 mg in afternoon, and 40 mg in the evening (every 8 hours apart). Patient receiving total of 150 mg of methadone in a day.  Appears comfortable on current doses, does admit that pain is reasonably well controlled, will  not do any further titration patient has been encouraged to ask for and receive as needed oxycodone 30 mg because it is available every 4 hours on an as-needed basis.                                              -Personally ordered and reviewed EKG to monitor while patient receiving methadone and QTc noted to be 445 on 06/09/2023                               -Continue oxycodone to 30 mg every 4 hours as needed                                                               -Patient states that she cannot take neuropathic agents such as gabapentin, pregabalin, or duloxetine due to what she describes as  "stomach discomfort". IV Zofran PRN, also on PO anti nausea medication.                   -Constipation, in setting of chronic opioid use                               -Change senna 2 tabs twice daily   -Continue MiraLAX 17 g daily   # Psycho-social/Spiritual Support:  - Support System: Daughters  # Discharge Planning: To Be Determined Discussed with [facility] colleague, recommend discharge home soon, recommend outpatient palliative follow up at [facility]. PMT to sign off.   Thank you for allowing the palliative care team to participate in the care Valerie Kerr.  low MDM [name] MD Palliative Care Provider PMT # [phone]  If patient remains symptomatic despite maximum doses, please call PMT at [phone] between 0700 and 1900. Outside of these hours, please call attending, as PMT does not have night coverage.

## 2023-06-19 NOTE — Progress Notes (Signed)
 AVS reviewed w/ pt who verbalized an understanding- no other questions at this time. Pt's ride will be here after 1600.

## 2023-06-19 NOTE — Discharge Summary (Signed)
 Physician Discharge Summary  Valerie Kerr GNF:[id number] DOB: 1960-04-11 DOA: 06/08/2023  PCP: Hoy Register, MD  Admit date: 06/08/2023 Discharge date: 06/19/2023  Admitted From: home Discharge disposition: home   Recommendations for Outpatient Follow-Up:   Appointments on Friday with palliative care and Dr. Bertis Ruddy Bowel regimen while on pain meds   Discharge Diagnosis:   Principal Problem:   Cancer-related breakthrough pain Active Problems:   Cancer associated pain   Metastatic leiomyosarcoma of bone (HCC)   Chronic pain syndrome   Essential hypertension   GERD (gastroesophageal reflux disease)   Physical debility   Generalized anxiety disorder   High risk medication use   Drug-induced constipation   Counseling and coordination of care   Medication management   Palliative care encounter   Malignant neoplasm metastatic to bone [facility])    Discharge Condition: Improved.  Diet recommendation:   Regular.  Wound care: None.  Code status: Full.   History of Present Illness:   Valerie Kerr is a 63 y.o. 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 Patient also has evidence of pathologic spinal fractures. It also showed a chronic fracture of the right pubis without hypermetabolic activity without evidence of bony union. Patient has had poorly controlled back pain. Notes from her oncology appointment earlier this month indicate persistent pain with patient not complying with all of her medical treatment. Patient  had her oxycodone medication increased. Patient was supposed to follow-up with pain management but she did not go to her appointment because of her pain. Patient is post to be on methadone but ran out of that prescription recently. Patient states she has not been able to move or get out of the bed in the last few days. She denies any fevers or chills. No focal numbness or weakness. The pain is severe in her lower back area. It does move towards the left side in her hip area no fevers or chills. No vomiting or diarrhea.    Per chart review of the oncology note patient has been taking methadone 70 mg liquid prescribed by her pain management specialist in [place], 40 mg in the afternoon and 40 mg in the evening.  She has been also taking oxycodone 20 mg as needed.  She has been noncompliance with dexamethasone.  Oncologist has been increased oxycodone to 30 mg as needed for breakthrough pain.  Patient has been referred to to keep appointment with palliative care for assistant for pain management.       Hospital Course by Problem:   Cancer-related breakthrough pain - Currently on methadone, oxycodone  - The patient refuses to take dexamethasone because she thinks it is causing exacerbation of the pain - Palliative care team has also been contacted to help with symptom management-- will follow up outpatient -- finished radiation     Metastatic leiomyosarcoma of bone (HCC) - Currently undergoing palliative radiation to the hip-- finished Friday - Chemotherapy was scheduled for March 17-she has been missing appointments-she states this is because her pain is too severe - Oncology consulted-- has follow up arranged   Dysuria - UA +- she grew E coli  last time and was not treated-  Cefadroxil     Essential hypertension -lisinopril/HCTZ on hold due to hypotension-- follow up outpatient  Foot pain -voltaren gel with improvement -? Plantar fascitis-- outpatient follow up     GERD (gastroesophageal  reflux disease) - Receiving pantoprazole     Generalized anxiety disorder - She receives as needed Xanax for this as outpatient which has been resumed   Constipation - Senna resumed    Medical Consultants:      Discharge Exam:   Vitals:   06/18/23 1947 06/19/23 0527  BP: (!) 121/58 115/68  Pulse: 93 93  Resp: 18 18  Temp: 98.7 F (37.1 C) 98.5 F (36.9 C)  SpO2: 97% 92%   Vitals:   06/18/23 0824 06/18/23 1340 06/18/23 1947 06/19/23 0527  BP: (!) 100/55 110/64 (!) 121/58 115/68  Pulse: 95 96 93 93  Resp: 18 16 18 18   Temp: 98.9 F (37.2 C) 98.4 F (36.9 C) 98.7 F (37.1 C) 98.5 F (36.9 C)  TempSrc: Oral Oral Oral Oral  SpO2: 99% 99% 97% 92%  Weight:      Height:        General exam: Appears calm and comfortable.   The results of significant diagnostics from this hospitalization (including imaging, microbiology, ancillary and laboratory) are listed below for reference.     Procedures and Diagnostic Studies:   No results found.   Labs:   Basic Metabolic Panel: No results for input(s): "NA", "K", "CL", "CO2", "GLUCOSE", "BUN", "CREATININE", "CALCIUM", "MG", "PHOS" in the last 168 hours. GFR Estimated Creatinine Clearance: 58.9 mL/min (by C-G formula based on SCr of 0.89 mg/dL). Liver Function Tests: No results for input(s): "AST", "ALT", "ALKPHOS", "BILITOT", "PROT", "ALBUMIN" in the last 168 hours. No results for input(s): "LIPASE", "AMYLASE" in the last 168 hours. No results for input(s): "AMMONIA" in the last 168 hours. Coagulation profile No results for input(s): "INR", "PROTIME" in the last 168 hours.  CBC: No results for input(s): "WBC", "NEUTROABS", "HGB", "HCT", "MCV", "PLT" in the last 168 hours. Cardiac Enzymes: No results for input(s): "CKTOTAL", "CKMB", "CKMBINDEX", "TROPONINI" in the last 168 hours. BNP: Invalid input(s): "POCBNP" CBG: No results for input(s): "GLUCAP" in the last 168 hours. D-Dimer No results for input(s):  "DDIMER" in the last 72 hours. Hgb A1c No results for input(s): "HGBA1C" in the last 72 hours. Lipid Profile No results for input(s): "CHOL", "HDL", "LDLCALC", "TRIG", "CHOLHDL", "LDLDIRECT" in the last 72 hours. Thyroid function studies No results for input(s): "TSH", "T4TOTAL", "T3FREE", "THYROIDAB" in the last 72 hours.  Invalid input(s): "FREET3" Anemia work up No results for input(s): "VITAMINB12", "FOLATE", "FERRITIN", "TIBC", "IRON", "RETICCTPCT" in the last 72 hours. Microbiology No results found for this or any previous visit (from the past 240 hours).   Discharge Instructions:   Discharge Instructions     Diet general   Complete by: As directed    Discharge instructions   Complete by: As directed    Methadone sent in by Dr. Bertis Ruddy -appointments: palliative care consult Friday at 1030 and Dr. Bertis Ruddy will see her after that   Increase activity slowly   Complete by: As directed       Allergies as of 06/19/2023       Reactions   Codeine Itching, Rash   Fentanyl Rash   Skin rash, local swelling from patch   Hydrocodone-acetaminophen Rash   Tramadol Other (See Comments)   GI upset,also trembling sensation        Medication  List     PAUSE taking these medications    lisinopril-hydrochlorothiazide 20-25 MG tablet Wait to take this until your doctor or other care provider tells you to start again. Commonly known as: ZESTORETIC Take 1 tablet by mouth daily.       STOP taking these medications    dexamethasone 4 MG tablet Commonly known as: DECADRON   lidocaine-prilocaine cream Commonly known as: EMLA   nitrofurantoin (macrocrystal-monohydrate) 100 MG capsule Commonly known as: Macrobid   phenazopyridine 100 MG tablet Commonly known as: Pyridium       TAKE these medications    ALPRAZolam 0.25 MG tablet Commonly known as: XANAX Take 0.5 tablets (0.125 mg total) by mouth 2 (two) times daily as needed for anxiety.   calcium-vitamin D 500-5  MG-MCG tablet Commonly known as: OSCAL WITH D Take 1 tablet by mouth 2 (two) times daily with a meal.   cetirizine 10 MG tablet Commonly known as: ZYRTEC TAKE 1 TABLET BY MOUTH EVERY DAY   cyclobenzaprine 10 MG tablet Commonly known as: FLEXERIL Take 10 mg by mouth 3 (three) times daily as needed for muscle spasms.   docusate sodium 100 MG capsule Commonly known as: COLACE Take 1 capsule (100 mg total) by mouth 2 (two) times daily.   fluticasone 50 MCG/ACT nasal spray Commonly known as: FLONASE INSTILL 1 SPRAY INTO EACH NOSTRIL DAILY What changed: See the new instructions.   ibuprofen 400 MG tablet Commonly known as: ADVIL Take 1 tablet (400 mg total) by mouth every 6 (six) hours as needed for mild pain (pain score 1-3) (or Fever >/= 101).   lidocaine 5 % Commonly known as: Lidoderm Place 1 patch onto the skin daily. Remove & Discard patch within 12 hours or as directed by MD   methadone 10 MG tablet Commonly known as: DOLOPHINE Take 70 mg in the morning, 40 mg in the afternoon and 40 mg in the evening What changed:  how much to take how to take this when to take this additional instructions   omeprazole 20 MG capsule Commonly known as: PRILOSEC Take 1 capsule (20 mg total) by mouth daily. Must have office visit for refills   ondansetron 8 MG tablet Commonly known as: ZOFRAN Take 1 tablet (8 mg total) by mouth every 8 (eight) hours as needed for nausea or vomiting. Start on the third day after chemotherapy.   oxycodone 30 MG immediate release tablet Commonly known as: ROXICODONE Take 1 tablet (30 mg total) by mouth every 4 (four) hours as needed.   polyethylene glycol 17 g packet Commonly known as: MIRALAX / GLYCOLAX Take 17 g by mouth daily.   prochlorperazine 10 MG tablet Commonly known as: COMPAZINE Take 1 tablet (10 mg total) by mouth every 6 (six) hours as needed for nausea or vomiting.   Voltaren 1 % Gel Generic drug: diclofenac Sodium Apply 4 g  topically 4 (four) times daily.          Time coordinating discharge: 45 min  Signed:  Joseph Art DO  [facility] 06/19/2023, 10:44 AM

## 2023-06-19 NOTE — Progress Notes (Addendum)
 Physical Therapy Treatment Patient Details Name: Valerie Kerr MRN: [id number] DOB: [birth year] Today's Date: 06/19/2023   History of Present Illness 63 yo female presents to the hospital for uncontrolled pain. CT 3/13: As seen on prior PET-CT there is a large destructive mass involving  the sacrum with extension to the sacroiliac joints, the at the disc  at L5-S1 and potentially involvement of L5. Associated pathologic  fractures involving the sacrum and the right transverse process of  L5. This extends into the presacral space.  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    PT Comments  No assist needed for supine to sit. Pt was able to take several pivotal steps with RW from bed to recliner, which she reported was very uncomfortable, then transferred to bedside commode, then to bed. Pt was able to stand and march in place with RW x 5 reps. 7/10 posterior thigh pain in both legs limited activity tolerance. Ambulation deferred 2* pain. Overall increased activity tolerance noted today. Pt reports she feels she'll be able to manage at home with help from her daughter.     If plan is discharge home, recommend the following: A little help with walking and/or transfers;A little help with bathing/dressing/bathroom;Assistance with cooking/housework;Assist for transportation   Can travel by private vehicle        Equipment Recommendations  None recommended by PT    Recommendations for Other Services       Precautions / Restrictions Precautions Precautions: Fall Restrictions Weight Bearing Restrictions Per Provider Order: No     Mobility  Bed Mobility Overal bed mobility: Modified Independent Bed Mobility: Supine to Sit, Sit to Supine     Supine to sit: Modified independent (Device/Increase time), HOB elevated, Used rails Sit to supine: Modified  independent (Device/Increase time), HOB elevated, Used rails        Transfers Overall transfer level: Needs assistance Equipment used: Rolling walker (2 wheels) Transfers: Sit to/from Stand, Bed to chair/wheelchair/BSC Sit to Stand: Supervision   Step pivot transfers: Supervision       General transfer comment: VCs hand placement, sit to stand x 3 trials; pt refused gait belt. Step pivot with RW from bed to recliner, pt reported pain was too severe in recliner, then transferred to bedside commode, then to bed.    Ambulation/Gait               General Gait Details: deferred 2* pain   Stairs             Wheelchair Mobility     Tilt Bed    Modified Rankin (Stroke Patients Only)       Balance Overall balance assessment: Mild deficits observed, not formally tested                                          Communication Communication Communication: No apparent difficulties  Cognition Arousal: Alert Behavior During Therapy: WFL for tasks assessed/performed   PT - Cognitive impairments: No apparent impairments                         Following commands: Intact      Cueing    Exercises General Exercises - Lower Extremity Hip Flexion/Marching: AROM, Both, 5 reps, Standing    General  Comments        Pertinent Vitals/Pain Pain Assessment Pain Score: 7  Pain Location: posterior thighs BLE Pain Descriptors / Indicators: Sharp, Stabbing, Grimacing, Guarding Pain Intervention(s): Limited activity within patient's tolerance, Monitored during session, Premedicated before session, Repositioned, Patient requesting pain meds-RN notified    Home Living                          Prior Function            PT Goals (current goals can now be found in the care plan section) Acute Rehab PT Goals Patient Stated Goal: return home with daughters assisting PT Goal Formulation: With patient Time For Goal Achievement:  06/23/23 Potential to Achieve Goals: Good Progress towards PT goals: Progressing toward goals    Frequency    Min 2X/week      PT Plan      Co-evaluation              AM-PAC PT "6 Clicks" Mobility   Outcome Measure  Help needed turning from your back to your side while in a flat bed without using bedrails?: None Help needed moving from lying on your back to sitting on the side of a flat bed without using bedrails?: None Help needed moving to and from a bed to a chair (including a wheelchair)?: None Help needed standing up from a chair using your arms (e.g., wheelchair or bedside chair)?: None Help needed to walk in hospital room?: A Little Help needed climbing 3-5 steps with a railing? : A Lot 6 Click Score: 21    End of Session   Activity Tolerance: Patient limited by pain Patient left: in bed;with call bell/phone within reach;with bed alarm set Nurse Communication: Mobility status;Patient requests pain meds PT Visit Diagnosis: Other abnormalities of gait and mobility (R26.89);Muscle weakness (generalized) (M62.81);Pain Pain - Right/Left:  (both) Pain - part of body:  (posterior thighs)     Time: 1610-9604 PT Time Calculation (min) (ACUTE ONLY): 19 min  Charges:    $Therapeutic Activity: 8-22 mins PT General Charges $$ ACUTE PT VISIT: 1 Visit                    Tamala Ser PT 06/19/2023  Acute Rehabilitation Services  Office [phone]

## 2023-06-19 NOTE — TOC Transition Note (Signed)
 Transition of Care [facility]) - Discharge Note   Patient Details  Name: Rashi Granier MRN: [id number] Date of Birth: May 09, 1960  Transition of Care [facility] CM/SW Contact:  Beckie Busing, RN Phone Number:[phone]  06/19/2023, 1:54 PM   Clinical Narrative:    [facility] consulted for issue with bed. CM at bedside to discuss issues with patient. Patient states that she does not know and that her daughter handles everything for her. CM spoke with daughter and daughter states that she does not know who patient has bed through. CM has explained to daughter that label should be on the bed to identify where the bed came from/. CM has explained that patient/ family can call dme  company to request bed rails. Daughter states that she will take a look when she gets home. CM has informed daughter that once she finds the name she can call DME agency to request rails for bed.  HH referral has been called to Angie with [facility].      Barriers to Discharge: No Barriers Identified, Continued Medical Work up   Patient Goals and CMS Choice            Discharge Placement                       Discharge Plan and Services Additional resources added to the After Visit Summary for                                       Social Drivers of Health (SDOH) Interventions SDOH Screenings   Food Insecurity: Food Insecurity Present (06/08/2023)  Housing: Low Risk  (06/08/2023)  Transportation Needs: No Transportation Needs (06/08/2023)  Utilities: Not At Risk (06/08/2023)  Depression (PHQ2-9): Low Risk  (04/13/2023)  Social Connections: Feeling Socially Integrated (04/19/2023)   Received from [facility]  Tobacco Use: High Risk (06/08/2023)  Health Literacy: Adequate Health Literacy (04/19/2023)   Received from [facility]     Readmission Risk Interventions     No data to display

## 2023-06-19 NOTE — Radiation Completion Notes (Addendum)
  Radiation Oncology         [phone] ________________________________  Name: Valerie Kerr MRN: [id number]  Date of Service: 06/16/2023  DOB: June 28, 1960  End of Treatment Note  Diagnosis: Stage IV (T2, N0, M1) leiomyosarcoma Intent: Palliative     ==========DELIVERED PLANS==========  First Treatment Date: 2023-05-18 Last Treatment Date: 2023-06-16   Plan Name: Pelvis_sacrum Site: Sacrum Technique: IMRT Mode: Photon Dose Per Fraction: 2.5 Gy Prescribed Dose (Delivered / Prescribed): 37.5 Gy / 37.5 Gy Prescribed Fxs (Delivered / Prescribed): 15 / 15     ====================================   The patient tolerated radiation. She developed worsening left hip pain and was hospitalized during her treatment for uncontrolled pain. She finished her treatment while hospitalized and was discharged on 06/19/2023 on methadone and oxycodone with recommendations to follow-up with oncology palliative care to help with symptom management.   The patient will return in one month for follow-up.      Joyice Faster, PA-C

## 2023-06-20 ENCOUNTER — Telehealth: Payer: Self-pay

## 2023-06-20 NOTE — Transitions of Care (Post Inpatient/ED Visit) (Signed)
   06/20/2023  Name: Valerie Kerr MRN: [id number] DOB: 1960-05-15  Today's TOC FU Call Status: Today's TOC FU Call Status:: Unsuccessful Call (1st Attempt) Unsuccessful Call (1st Attempt) Date: 06/20/23  Attempted to reach the patient regarding the most recent Inpatient/ED visit.  Follow Up Plan: Additional outreach attempts will be made to reach the patient to complete the Transitions of Care (Post Inpatient/ED visit) call.   Signature  Robyne Peers, RN

## 2023-06-21 ENCOUNTER — Telehealth: Payer: Self-pay

## 2023-06-21 NOTE — Transitions of Care (Post Inpatient/ED Visit) (Signed)
   06/21/2023  Name: Valerie Kerr MRN: [id number] DOB: 11-09-1960  Today's TOC FU Call Status: Today's TOC FU Call Status:: Unsuccessful Call (2nd Attempt) Unsuccessful Call (1st Attempt) Date: 06/20/23 Unsuccessful Call (2nd Attempt) Date: 06/21/23  Attempted to reach the patient regarding the most recent Inpatient/ED visit.  Follow Up Plan: Additional outreach attempts will be made to reach the patient to complete the Transitions of Care (Post Inpatient/ED visit) call.   Signature  Robyne Peers, RN

## 2023-06-22 ENCOUNTER — Telehealth: Payer: Self-pay

## 2023-06-22 ENCOUNTER — Telehealth: Payer: Self-pay | Admitting: Family Medicine

## 2023-06-22 NOTE — Telephone Encounter (Signed)
 Call returned to patient and Valerie Kerr and documented in another [facility] call today

## 2023-06-22 NOTE — Transitions of Care (Post Inpatient/ED Visit) (Signed)
   06/22/2023  Name: Valerie Kerr MRN: [id number] DOB: 08/15/1960  Today's TOC FU Call Status: Today's TOC FU Call Status:: Unsuccessful Call (3rd Attempt) Unsuccessful Call (1st Attempt) Date: 06/20/23 Unsuccessful Call (2nd Attempt) Date: 06/21/23 Unsuccessful Call (3rd Attempt) Date: 06/22/23  Attempted to reach the patient regarding the most recent Inpatient/ED visit.  Follow Up Plan: No further outreach attempts will be made at this time. We have been unable to contact the patient.  Dr Alvis Lemmings is listed as PCP but the patient has not seen her since 09/2018.   Letter sent to patient requesting she contact [facility] to schedule a follow up appointment as we have not been able to reach her.   Signature Robyne Peers, RN

## 2023-06-22 NOTE — Transitions of Care (Post Inpatient/ED Visit) (Signed)
 06/22/2023  Name: Valerie Kerr MRN: [id number] DOB: 1960-10-01  Today's TOC FU Call Status: Today's TOC FU Call Status:: Successful TOC FU Call Completed Unsuccessful Call (1st Attempt) Date: 06/20/23 Unsuccessful Call (2nd Attempt) Date: 06/21/23 Unsuccessful Call (3rd Attempt) Date: 06/22/23 [facility] FU Call Complete Date: 06/22/23 Patient's Name and Date of Birth confirmed.  Transition Care Management Follow-up Telephone Call Date of Discharge: 06/19/23 Discharge Facility: [facility]) Type of Discharge: Inpatient Admission Primary Inpatient Discharge Diagnosis:: cancer-related breakthrough pain How have you been since you were released from the hospital?: Worse (patient reports being in severe pain that she can't even sit up. Her daughter, Valerie Kerr, was with her during the call. The  patient was moaning in pain through out our conversation) Any questions or concerns?: Yes Patient Questions/Concerns:: ongoing pain.  She is concerned about running out of oxycodone. I explained to the patient and her daughter that she needs to contact oncology about the pain.  I also informed them that she has an appointment with oncology and pallative care tomorrow, and both the patient and her daughter did not think the patient could get there because she is in so much pain. I told them to call oncology about the appointment, maybe it can be virtual. I also instructed them to request in home palliative care referral.  I explained that because her Medicaid [place] out of [place] she would not be eligible for PCS in [place]. her daugher was inquiring about rails for the hospital bed but she was not sure what company delivered the bed. Again, I told her to also check with the oncology office about the bed rails.  Dr Alvis Lemmings is listed as PCP but has not seen the patient since 2020 because the patient has been living in [place] which is her permanent residence. Patient Questions/Concerns Addressed: Other: (patient and Valerie Kerr  instructed to call the oncologist.)  Items Reviewed: Did you receive and understand the discharge instructions provided?: Yes Medications obtained,verified, and reconciled?: No Medications Not Reviewed Reasons:: Other: (She said she has all of the medications and did need to review the med list and did not have any questions about the med regime , other than her concern about running out of oxycodone.) Any new allergies since your discharge?: No Dietary orders reviewed?: No Do you have support at home?: Yes People in Home: child(ren), adult Name of Support/Comfort Primary Source: Valerie Kerr is her primary support  Medications Reviewed Today: Medications Reviewed Today   Medications were not reviewed in this encounter     Home Care and Equipment/Supplies: Were Home Health Services Ordered?: Yes Name of Home Health Agency:: [facility] Has Agency set up a time to come to your home?: No EMR reviewed for Home Health Orders: Orders present/patient has not received call (refer to CM for follow-up) (I spoke to Dee/ [facility] and confirmed they have the referral but are having difficulty obtaining auth from the insurance co. I asked her to please call Aramessie and explain to her the status of the referral and Geraldine Contras said she would have someone call.) Any new equipment or medical supplies ordered?: No  Functional Questionnaire: Do you need assistance with bathing/showering or dressing?: Yes (daughter is providing needed assistance.) Do you need assistance with meal preparation?: Yes (daughter is providing needed assistance) Do you need assistance with eating?: No Do you have difficulty maintaining continence: No (but she has difficulty moving around) Do you need assistance with getting out of bed/getting out of a chair/moving?: Yes (ambulates with rollator  and is in a great deal of pain) Do you have difficulty managing or taking your medications?: Yes (daughter is providing needed  assistance)  Follow up appointments reviewed: PCP Follow-up appointment confirmed?: No MD Provider Line Number:[phone] Given: No (Patient needs to estab care with a PCP in this area if she continues to reside in [place]. Her current PCP is in [place]) [facility] Follow-up appointment confirmed?: Yes Date of Specialist follow-up appointment?: 06/23/23 Follow-Up Specialty Provider:: oncology and palliative care Do you need transportation to your follow-up appointment?: No Do you understand care options if your condition(s) worsen?: Yes-patient verbalized understanding    SIGNATURE Robyne Peers, RN

## 2023-06-22 NOTE — Telephone Encounter (Signed)
 Copied from CRM [id number]. Topic: General - Other >> Jun 22, 2023 12:04 PM Yolanda T wrote: Reason for CRM: patients daughter Andree Elk returned call for Robyne Peers. Please f/u with patient at [phone]

## 2023-06-23 ENCOUNTER — Telehealth: Payer: Self-pay

## 2023-06-23 ENCOUNTER — Inpatient Hospital Stay: Admitting: Hematology and Oncology

## 2023-06-23 ENCOUNTER — Telehealth: Payer: Self-pay | Admitting: Licensed Clinical Social Worker

## 2023-06-23 ENCOUNTER — Other Ambulatory Visit: Payer: Self-pay

## 2023-06-23 ENCOUNTER — Inpatient Hospital Stay

## 2023-06-23 DIAGNOSIS — C499 Malignant neoplasm of connective and soft tissue, unspecified: Secondary | ICD-10-CM

## 2023-06-23 DIAGNOSIS — G893 Neoplasm related pain (acute) (chronic): Secondary | ICD-10-CM

## 2023-06-23 NOTE — Telephone Encounter (Signed)
 [facility] Clinical Social Work  CSW received referral from RN for in home assistance for this pt.  Due to pt's insurance being through [place], CSW called to determine personal care benefits and process.  Spoke with Lauren through [facility] [place] [phone]) who provided contact information for pt's care manager who would be able to answer the question on personal care benefits.  CSW left VM for care manager Lucienne Capers [phone].   Valerie Franzel E Kehinde Bowdish, LCSW

## 2023-06-23 NOTE — Telephone Encounter (Signed)
 Pt called to cancel her palliative appt for today  and LVM. RN attempted to call back, no answer, LVM and call back number.

## 2023-06-23 NOTE — Telephone Encounter (Signed)
 Virtual visit is not appropriate Whether the patient is ready or not, she needs minimum palliative care consult Please reschedule appt to next week Inability to walk essentially means she cannot get chemo, so we do not have other alternative options

## 2023-06-23 NOTE — Telephone Encounter (Signed)
 Called and left another message with daughter asking for a call back to the office.

## 2023-06-23 NOTE — Telephone Encounter (Signed)
 Called and left a message asking if she plans on keeping appt today with Dr. Bertis Ruddy. She called earlier and canceled palliative care appt for today. Ask for a call back to the office.

## 2023-06-23 NOTE — Telephone Encounter (Signed)
 Daughter, Aremessie called for Mom. Her Mom is now unable to walk due to pain and they cannot come to appt today. Daughter ask if Dr. Bertis Ruddy could make a home visit.  Told her Dr. Bertis Ruddy does not do home visits. Offered to send a Hospice referral and daughter said that she is not ready for hospice. Told her I would send a referral to social worker to see if they had any assistance.  She is asking if they can have a virtual visit with Dr. Bertis Ruddy? Referral sent to social worker to see if they can offer assistance.

## 2023-06-23 NOTE — Telephone Encounter (Signed)
 Called and given below message to daughter. She verbalized understanding. She agreed to palliative care referral. [facility] to [facility] palliative care program.  Daughter will call the office back to schedule appt with Dr. Bertis Ruddy.  FYI

## 2023-06-28 ENCOUNTER — Other Ambulatory Visit (HOSPITAL_COMMUNITY): Payer: Self-pay

## 2023-06-28 ENCOUNTER — Encounter: Payer: Self-pay | Admitting: Hematology and Oncology

## 2023-06-28 ENCOUNTER — Other Ambulatory Visit: Payer: Self-pay | Admitting: Hematology and Oncology

## 2023-06-28 MED ORDER — OXYCODONE HCL 30 MG PO TABS
30.0000 mg | ORAL_TABLET | ORAL | 0 refills | Status: DC | PRN
  Filled 2023-06-28: qty 90, 23d supply, fill #0

## 2023-06-28 MED ORDER — METHADONE HCL 10 MG PO TABS
ORAL_TABLET | ORAL | 0 refills | Status: DC
  Filled 2023-06-28: qty 120, 8d supply, fill #0

## 2023-06-29 ENCOUNTER — Telehealth: Payer: Self-pay

## 2023-06-29 ENCOUNTER — Other Ambulatory Visit: Payer: Self-pay | Admitting: Hematology and Oncology

## 2023-06-29 NOTE — Telephone Encounter (Signed)
 [name] with [facility] called and left a message. [name] enrolled in palliative care today at 10 am.  [name]

## 2023-06-30 ENCOUNTER — Telehealth: Payer: Self-pay | Admitting: Nurse Practitioner

## 2023-06-30 NOTE — Telephone Encounter (Signed)
 Left a voicemail with appointment details and will be mailed an appointment reminder.

## 2023-07-04 ENCOUNTER — Encounter: Payer: Self-pay | Admitting: Radiation Oncology

## 2023-07-04 ENCOUNTER — Telehealth: Payer: Self-pay | Admitting: Licensed Clinical Social Worker

## 2023-07-04 NOTE — Telephone Encounter (Signed)
 [facility] Clinical Social Work  CSW attempted to contact pt/ family by phone to determine if they would like to pursue in-home aide through [employer] (CSW previously spoke with Community education officer social worker Herbert Seta who confirmed that pt does potentially have this benefit).  No answer. Left VM with direct contact information.   Aemilia Dedrick E Rajvi Armentor, LCSW

## 2023-07-05 ENCOUNTER — Inpatient Hospital Stay: Attending: Hematology and Oncology | Admitting: Licensed Clinical Social Worker

## 2023-07-05 NOTE — Progress Notes (Signed)
 [facility] CSW Progress Note  Clinical Child psychotherapist received return call from pt's daughter Andree Elk to discuss in-home aide assistance.  CSW had received information from the [employer], Herbert Seta, that pt is qualified for assistance and that family should contact her directly to discuss options of either agencies or to have a family member be compensated for caregiving. CSW shared this information with pt's daughter and provided direct number for [employer] [facility] [phone]).  No other questions or needs at this time.    Tattiana Fakhouri E Anaeli Cornwall, LCSW Clinical Social Worker [employer]

## 2023-07-06 ENCOUNTER — Other Ambulatory Visit (HOSPITAL_COMMUNITY): Payer: Self-pay

## 2023-07-06 ENCOUNTER — Telehealth: Payer: Self-pay

## 2023-07-06 ENCOUNTER — Other Ambulatory Visit: Payer: Self-pay | Admitting: Hematology and Oncology

## 2023-07-06 MED ORDER — METHADONE HCL 10 MG PO TABS
ORAL_TABLET | ORAL | 0 refills | Status: DC
  Filled 2023-07-06: qty 120, 8d supply, fill #0

## 2023-07-06 NOTE — Telephone Encounter (Signed)
 She called and left a message requesting methadone refill to pharmacy.

## 2023-07-06 NOTE — Telephone Encounter (Signed)
 Called and left a message that Rx sent to pharmacy. Left appt details for next appt with palliative care on 4/14. Ask her to call the office with questions/concerns.

## 2023-07-10 ENCOUNTER — Inpatient Hospital Stay: Admitting: Nurse Practitioner

## 2023-07-13 ENCOUNTER — Other Ambulatory Visit: Payer: Self-pay

## 2023-07-13 NOTE — Progress Notes (Signed)
 Pt has no-showed multiple palliative appointments, palliative referral closed at this time. Palliative services available by re-consult if desired.

## 2023-07-14 ENCOUNTER — Other Ambulatory Visit: Payer: Self-pay | Admitting: Hematology and Oncology

## 2023-07-14 ENCOUNTER — Telehealth: Payer: Self-pay

## 2023-07-14 ENCOUNTER — Other Ambulatory Visit (HOSPITAL_COMMUNITY): Payer: Self-pay

## 2023-07-14 MED ORDER — METHADONE HCL 10 MG PO TABS
ORAL_TABLET | ORAL | 0 refills | Status: DC
  Filled 2023-07-14: qty 120, 8d supply, fill #0

## 2023-07-14 NOTE — Telephone Encounter (Signed)
 Can you send a refill? Thanks!

## 2023-07-14 NOTE — Telephone Encounter (Signed)
 Refill sent to patient's pharmacy per her request as oncologist is out of office. I have reviewed the PDMP during this encounter.  Chart review shows that patient has multiple no-showed palliative care appointments recently.  RN attempted to call patient to offer palliative care appointment for 07/17/2023 at 11:30 AM however was unable to reach her.  Patient will need to follow-up with either oncologist or palliative care for future refills.  Will make oncologist aware of situation.

## 2023-07-14 NOTE — Telephone Encounter (Signed)
 Received a refill request from pharmacy for Methadone  refill. She called the switch board and requested a refill. Called and left a message offering appt with Palliative care on Monday at 1130, to continue getting refills she needs to be seeing Dr. Marton Sleeper or palliative care at [facility]. Ask Katiya to call the office back regarding appt and to discuss future refills.

## 2023-07-15 ENCOUNTER — Other Ambulatory Visit (HOSPITAL_COMMUNITY): Payer: Self-pay

## 2023-07-16 NOTE — Progress Notes (Signed)
 Radiation Oncology         [phone] ________________________________  Name: Valerie Kerr MRN: [id number]  Date: 07/17/2023  DOB: 12/12/1960  Follow-Up Visit Note  CC: Joaquin Mulberry, MD  Joaquin Mulberry, MD  No diagnosis found.  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, diagnosed in December, 2017, s/p primary chemoradiation therapy completed in March, 2018    Interval Since Last Radiation: 1 month   2) Intent: Palliative  Radiation Treatment Dates: First Treatment Date: 2023-05-18 -- Last Treatment Date: 2023-06-16 Site/Dose/Technique/Mode:  Plan Name: Pelvis_sacrum Site: Sacrum Technique: IMRT Mode: Photon Dose Per Fraction: 2.5 Gy Prescribed Dose (Delivered / Prescribed): 37.5 Gy / 37.5 Gy Prescribed Fxs (Delivered / Prescribed): 15 / 15  1)  Radiation Treatment Dates: 04/12/2016 - 05/31/2016 Site/Dose/Technique/Mode: 1) Pelvis/ 45 Gy in 25 fractions, 3D/15X 2) pelvic Boost/ 9 Gy in 5 fractions, Isodose Plan/15X  Narrative:  The patient returns today for routine follow-up. She developed worsening left hip pain and was hospitalized during her treatment for uncontrolled pain. She finished her treatment while hospitalized and was discharged on 06/19/2023 on methadone  and oxycodone  with recommendations to follow-up with oncology palliative care to help with symptom management.   Since her initial consultation date of 04/13/23, she presented for a restaging PET scan on 04/19/23 which demonstrated: a hypermetabolic sacral mass measuring approximately 8.0 cm in the greatest extent (favoring active malignancy given the degree of soft tissue density and infiltration; abnormal erosions along the iliac sides of the sacroiliac joints, appearing less hypermetabolic than the main sacral findings, and with differential considerations including erosions from arthropathy vs the  less likely differentials of tumor or infection; a hypermetabolic but likely benign vertical fracture of the right L5 transverse process extending partially into the vertebral body; chronic fracture of the right pubis without hypermetabolic activity but with evidence of bony nonunion; a small focus of accentuated activity along the cutaneous or immediate subcutaneous surface of the left lower breast, associated with questionable faint cutaneous thickening or subcutaneous adipose tissue stranding within vicinity; and a small focus of activity along the right anterior ninth intercostal space just anterior to the right hepatic lobe favoring physiologic muscular activity.    She then proceeded with biopsies of the lytic sacral mass on 04/26/33. Pathology showed spindle cell malignancy consistent with high-grade sarcoma, favoring high-grade leiomyosarcoma. Diagnostic notes also indicated a microscopic focus suspicious for lymphovascular  Involvement.   Although systemic therapy was initially recommended by Dr. Marton Sleeper, she has since had a drastic decrease in functional status which has been further exacerbated by medication non-compliance/uncontrolled pain. Her pain was better controlled, though temporarily, during her admission in mid March. Given her overall function decline, and her now non-ambulatory status, she does not qualify for chemotherapy at this time.  As noted above, she was seen my the palliative care team prior to discharge on 06/19/23. At that time, she expressed an interest in pursuing PT/OT to improve her functional status with the hopes of pursuing further treatment in the future. Since being discharge, she has however missed all of her scheduled OP palliative care appointments and her referral has been subsequently closed. Social work is also working with the patient and her family to establish at home-care.   Other pertinent imaging performed thus far includes a CT AP with contrast on  06/08/23 (performed upon admission that day) which redemonstrated the large destructive mass involving the  sacrum, with extension to the sacroiliac joints, L5-S1 disc space, and potentially involving of L5. Imaging findings also included associated pathologic fractures involving the sacrum and the right transverse process of L5, with extension into the presacral space also appreciated. There was otherwise not evidence of lymphadenopathy in the abdomen or pelvis.   Allergies:  is allergic to codeine, fentanyl , hydrocodone -acetaminophen , and tramadol.  Meds: Current Outpatient Medications  Medication Sig Dispense Refill   ALPRAZolam  (XANAX ) 0.25 MG tablet Take 0.5 tablets (0.125 mg total) by mouth 2 (two) times daily as needed for anxiety. 30 tablet 0   calcium -vitamin D  (OSCAL WITH D) 500-5 MG-MCG tablet Take 1 tablet by mouth 2 (two) times daily with a meal. 60 tablet 0   cetirizine  (ZYRTEC ) 10 MG tablet TAKE 1 TABLET BY MOUTH EVERY DAY (Patient taking differently: Take 10 mg by mouth daily.) 90 tablet 0   cyclobenzaprine  (FLEXERIL ) 10 MG tablet Take 10 mg by mouth 3 (three) times daily as needed for muscle spasms.     docusate sodium  (COLACE) 100 MG capsule Take 1 capsule (100 mg total) by mouth 2 (two) times daily.     fluticasone  (FLONASE ) 50 MCG/ACT nasal spray INSTILL 1 SPRAY INTO EACH NOSTRIL DAILY (Patient taking differently: Place 1 spray into both nostrils daily.) 48 mL 0   ibuprofen  (ADVIL ) 400 MG tablet Take 1 tablet (400 mg total) by mouth every 6 (six) hours as needed for mild pain (pain score 1-3) (or Fever >/= 101).     lidocaine  (LIDODERM ) 5 % Place 1 patch onto the skin daily. Remove & Discard patch within 12 hours or as directed by MD (Patient not taking: Reported on 06/09/2023) 30 patch 0   [Paused] lisinopril -hydrochlorothiazide  (ZESTORETIC ) 20-25 MG tablet Take 1 tablet by mouth daily. 30 tablet 0   methadone  (DOLOPHINE ) 10 MG tablet Take 7 tablets (70 mg) in the morning, 4  tablets (40 mg) in the afternoon and 4 tablets (40 mg) in the evening 120 tablet 0   omeprazole  (PRILOSEC) 20 MG capsule Take 1 capsule (20 mg total) by mouth daily. Must have office visit for refills 90 capsule 0   oxycodone  (ROXICODONE ) 30 MG immediate release tablet Take 1 tablet (30 mg total) by mouth every 4 (four) hours as needed. 90 tablet 0   polyethylene glycol (MIRALAX  / GLYCOLAX ) 17 g packet Take 17 g by mouth daily.     VOLTAREN  1 % GEL Apply 4 g topically 4 (four) times daily. (Patient not taking: Reported on 06/09/2023) 100 g 0   No current facility-administered medications for this encounter.    Physical Findings: The patient is in no acute distress. Patient is alert and oriented.  vitals were not taken for this visit. .  No significant changes. Lungs are clear to auscultation bilaterally. Heart has regular rate and rhythm. No palpable cervical, supraclavicular, or axillary adenopathy. Abdomen soft, non-tender, normal bowel sounds.   Lab Findings: Lab Results  Component Value Date   WBC 8.6 06/09/2023   HGB 10.8 (L) 06/09/2023   HCT 36.5 06/09/2023   MCV 91.3 06/09/2023   PLT 291 06/09/2023    Radiographic Findings: No results found.  Impression: Stage IV (T2, N0, M1) leiomyosarcoma of the sacrum  The patient is recovering from the effects of radiation.  ***  Plan:  ***   *** minutes of total time was spent for this patient encounter, including preparation, face-to-face counseling with the patient and coordination of care, physical exam, and documentation of the encounter.  ____________________________________  Noralee Beam, PhD, MD  This document serves as a record of services personally performed by Retta Caster, MD. It was created on his behalf by Aleta Anda, a trained medical scribe. The creation of this record is based on the scribe's personal observations and the provider's statements to them. This document has been checked and approved by the attending  provider.

## 2023-07-17 ENCOUNTER — Other Ambulatory Visit: Payer: Self-pay

## 2023-07-17 ENCOUNTER — Encounter: Payer: Self-pay | Admitting: Radiation Oncology

## 2023-07-17 ENCOUNTER — Ambulatory Visit
Admission: RE | Admit: 2023-07-17 | Discharge: 2023-07-17 | Disposition: A | Discharge status: Home / Self Care | Source: Ambulatory Visit | Attending: Radiation Oncology | Admitting: Radiation Oncology

## 2023-07-17 VITALS — BP 148/77 | HR 138 | Temp 98.0°F | Resp 20 | Ht 61.5 in | Wt 147.4 lb

## 2023-07-17 DIAGNOSIS — Z923 Personal history of irradiation: Secondary | ICD-10-CM | POA: Diagnosis not present

## 2023-07-17 DIAGNOSIS — C7951 Secondary malignant neoplasm of bone: Secondary | ICD-10-CM | POA: Insufficient documentation

## 2023-07-17 DIAGNOSIS — Z791 Long term (current) use of non-steroidal anti-inflammatories (NSAID): Secondary | ICD-10-CM | POA: Diagnosis not present

## 2023-07-17 DIAGNOSIS — Z79899 Other long term (current) drug therapy: Secondary | ICD-10-CM | POA: Diagnosis not present

## 2023-07-17 DIAGNOSIS — C499 Malignant neoplasm of connective and soft tissue, unspecified: Secondary | ICD-10-CM

## 2023-07-17 DIAGNOSIS — Z8541 Personal history of malignant neoplasm of cervix uteri: Secondary | ICD-10-CM | POA: Insufficient documentation

## 2023-07-17 DIAGNOSIS — C530 Malignant neoplasm of endocervix: Secondary | ICD-10-CM

## 2023-07-17 DIAGNOSIS — G893 Neoplasm related pain (acute) (chronic): Secondary | ICD-10-CM

## 2023-07-17 NOTE — Telephone Encounter (Signed)
 Met with Nahomy and Aremessie in the lobby. Latrisa had appt with Dr. Eloise Hake today and ask if she could see palliative care today.  She never called back to confirm appt the office offered Friday. Palliative care does not have appt today. Sent a message to palliative care nurse that her Daughter is willing to bring her this week for appt. Daughter Aremessie phone # [phone]

## 2023-07-17 NOTE — Progress Notes (Signed)
 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  They completed their radiation on: 2023-06-16   Does the patient complain of any of the following:  Pain: Yes, continues to have pain to left leg and lower back. Patient reports pain has improved. Rates pain 7/10.  Abdominal bloating: No Diarrhea/Constipation: No Nausea/Vomiting: No Vaginal Discharge: No Blood in Urine or Stool: No Urinary Issues (dysuria/incomplete emptying/ incontinence/ increased frequency/urgency): No Post radiation skin changes: Yes, continues to have darkened skin to treatment field.    Additional comments if applicable:  BP (!) 148/77 (BP Location: Left Arm)   Pulse (!) 138   Temp 98 F (36.7 C) (Temporal)   Resp 20   Ht 5' 1.5" (1.562 m)   Wt 147 lb 6 oz (66.8 kg)   SpO2 99%   BMI 27.40 kg/m

## 2023-07-21 ENCOUNTER — Telehealth: Payer: Self-pay

## 2023-07-21 NOTE — Telephone Encounter (Signed)
 Returned her call and left a message. She left a message requesting a refill of Oxycodone  and methadone  to the pharmacy. Dr. Marton Sleeper has left for today and will not be back in the office until Monday. With no scheduled appts no other provider will refill. She had x 4 no shows with palliative care and referral was closed by palliative care. A referral to palliative care was sent again on 4/21. She is currently seeing palliative care with [facility].  Left a message to go ER or Urgent care if medication refill needed.

## 2023-07-24 ENCOUNTER — Other Ambulatory Visit: Payer: Self-pay | Admitting: Hematology and Oncology

## 2023-07-24 ENCOUNTER — Telehealth: Payer: Self-pay

## 2023-07-24 ENCOUNTER — Other Ambulatory Visit: Payer: Self-pay

## 2023-07-24 ENCOUNTER — Telehealth: Payer: Self-pay | Admitting: *Deleted

## 2023-07-24 ENCOUNTER — Other Ambulatory Visit (HOSPITAL_COMMUNITY): Payer: Self-pay

## 2023-07-24 MED ORDER — METHADONE HCL 10 MG PO TABS
ORAL_TABLET | ORAL | 0 refills | Status: DC
  Filled 2023-07-24 (×2): qty 120, 8d supply, fill #0

## 2023-07-24 MED ORDER — OXYCODONE HCL 30 MG PO TABS
30.0000 mg | ORAL_TABLET | ORAL | 0 refills | Status: DC | PRN
  Filled 2023-07-24: qty 90, 15d supply, fill #0

## 2023-07-24 NOTE — Telephone Encounter (Signed)
 Returned her call regarding methadone  and oxycodone . Received a call from her insurance oxycodone  and methadone  Rx needs PA. Called and sent a message to the PA team. Elly Habermann the office is working on [place]. She verbalized understanding.

## 2023-07-24 NOTE — Telephone Encounter (Signed)
 Refill sent to [facility]

## 2023-07-24 NOTE — Telephone Encounter (Signed)
 Called and spoke with Valerie Kerr. She is in a lot of pain this am. Told her Dr. Marton Sleeper will refill pain medication 1 more time to [facility] and will stop refills in the future if she does not establish care with hospice. She verbalized understanding and said that she is not ready for hospice. She has established with palliative care with [facility]. She would like to see palliative care here at [facility]. Told her the referral has been sent. She will call the office back back for questions/concerns.

## 2023-07-24 NOTE — Telephone Encounter (Signed)
 No coverage found on CoverMyMeds [employer].[phone]) Transferred to pharmacy.  Submitted PA request with 24 hr expidited review instead of 72 hours. Awaiting determination. Methadone  reference no.#:[id number] Oxy IR.reference no.#: [id number]. Fax additional information to fax no.#: [phone].

## 2023-07-24 NOTE — Telephone Encounter (Signed)
 I did not see she went to the ER I can refill her prescription one more time and will stop refills in the future if she does not establish care with hospice

## 2023-07-25 ENCOUNTER — Other Ambulatory Visit (HOSPITAL_COMMUNITY): Payer: Self-pay

## 2023-07-31 ENCOUNTER — Telehealth: Payer: Self-pay | Admitting: Hematology and Oncology

## 2023-07-31 ENCOUNTER — Telehealth: Payer: Self-pay | Admitting: Nurse Practitioner

## 2023-07-31 NOTE — Telephone Encounter (Signed)
 I returned Valerie Kerr's call to reschedule her MD appointment and Palliative appointment. Valerie Kerr was unavailable so I left a detailed message for her to return my call so that we can get her scheduled for her Palliative appointment.

## 2023-07-31 NOTE — Telephone Encounter (Signed)
 Valerie Kerr mentioned that she will run out of medicine soon, I transferred [name] to the Nurse line.

## 2023-08-03 ENCOUNTER — Telehealth: Payer: Self-pay

## 2023-08-03 NOTE — Telephone Encounter (Signed)
 Returned her call regarding request for methadone  refill. Reminded her of call on 4/28 that Dr. Marton Sleeper would no longer be refilling pain medication, the 4/28 refill was the last refill. Instructed to call PCP, go to urgent care or ER of pain medication needed. Reminded of palliative care appt on 5/12 at 1 pm. She verbalized understanding.

## 2023-08-04 ENCOUNTER — Other Ambulatory Visit

## 2023-08-04 ENCOUNTER — Ambulatory Visit: Admitting: Hematology and Oncology

## 2023-08-07 ENCOUNTER — Encounter: Payer: Self-pay | Admitting: Nurse Practitioner

## 2023-08-07 ENCOUNTER — Other Ambulatory Visit (HOSPITAL_COMMUNITY): Payer: Self-pay

## 2023-08-07 ENCOUNTER — Inpatient Hospital Stay: Attending: Hematology and Oncology | Admitting: Nurse Practitioner

## 2023-08-07 ENCOUNTER — Other Ambulatory Visit: Payer: Self-pay

## 2023-08-07 VITALS — BP 152/84 | HR 109 | Temp 98.9°F | Resp 18 | Wt 155.8 lb

## 2023-08-07 DIAGNOSIS — G893 Neoplasm related pain (acute) (chronic): Secondary | ICD-10-CM

## 2023-08-07 DIAGNOSIS — C419 Malignant neoplasm of bone and articular cartilage, unspecified: Secondary | ICD-10-CM

## 2023-08-07 DIAGNOSIS — K5903 Drug induced constipation: Secondary | ICD-10-CM

## 2023-08-07 DIAGNOSIS — M792 Neuralgia and neuritis, unspecified: Secondary | ICD-10-CM

## 2023-08-07 DIAGNOSIS — Z515 Encounter for palliative care: Secondary | ICD-10-CM

## 2023-08-07 DIAGNOSIS — C7951 Secondary malignant neoplasm of bone: Secondary | ICD-10-CM | POA: Diagnosis not present

## 2023-08-07 MED ORDER — LIDOCAINE 5 % EX PTCH
1.0000 | MEDICATED_PATCH | CUTANEOUS | 3 refills | Status: DC
  Filled 2023-08-07: qty 30, 30d supply, fill #0

## 2023-08-07 MED ORDER — DEXAMETHASONE 4 MG PO TABS
4.0000 mg | ORAL_TABLET | Freq: Two times a day (BID) | ORAL | 0 refills | Status: DC
Start: 2023-08-07 — End: 2023-08-30
  Filled 2023-08-07: qty 20, 10d supply, fill #0

## 2023-08-07 MED ORDER — OXYCODONE HCL 30 MG PO TABS
30.0000 mg | ORAL_TABLET | ORAL | 0 refills | Status: DC | PRN
  Filled 2023-08-07: qty 90, 15d supply, fill #0

## 2023-08-07 MED ORDER — PREGABALIN 50 MG PO CAPS
50.0000 mg | ORAL_CAPSULE | Freq: Two times a day (BID) | ORAL | 2 refills | Status: DC
  Filled 2023-08-07: qty 60, 30d supply, fill #0

## 2023-08-07 MED ORDER — METHADONE HCL 10 MG PO TABS
ORAL_TABLET | ORAL | 0 refills | Status: DC
Start: 2023-08-07 — End: 2023-08-24
  Filled 2023-08-07: qty 225, 15d supply, fill #0

## 2023-08-07 MED ORDER — BIOFREEZE 10 % EX CREA
1.0000 | TOPICAL_CREAM | Freq: Three times a day (TID) | CUTANEOUS | 3 refills | Status: DC | PRN
  Filled 2023-08-07 – 2023-08-29 (×3): qty 85, 30d supply, fill #0

## 2023-08-07 NOTE — Progress Notes (Signed)
 Palliative Medicine [facility]  [phone] [phone]   Name: Turquoise Battista Date: 08/07/2023 MRN: [id number]  DOB: 1960-12-03  Patient Care Team: Joaquin Mulberry, MD as PCP - General (Family Medicine)    REASON FOR CONSULTATION: Valerie Kerr is a 63 y.o. 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  Palliative is seeing patient for symptom management and goals of care.    SOCIAL HISTORY:     reports that she has been smoking cigarettes. She has a 9 pack-year smoking history. She has never used smokeless tobacco. She reports that she does not drink alcohol and does not use drugs.  ADVANCE DIRECTIVES:  None on file  CODE STATUS: Full code  PAST MEDICAL HISTORY: Past Medical History:  Diagnosis Date   Anxiety    Cervical cancer [facility]) oncologist-  dr gorsuch/ dr Eloise Hake   02-20-2016 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---  treatment concurrent chemo (week 6 on hold due to pancytopenia) and radiation therapy (external beam 04-12-2016 to 05-31-2016)  started high-dose rate brachytherapy 05-26-2016   Chemotherapy-induced thrombocytopenia    Chronic pain disorder    back,neck-- chronic methadone    Environmental allergies    allergy to dust mites   GERD (gastroesophageal reflux disease)    Headache    Herniated disc, cervical    History of external beam radiation therapy 04/12/16-05/31/16   pelvis 45 Gy in 25 fractions, in  30 sessions, pelvis boost 9 Gy in 5 fractions   History of radiation therapy 06/20/16-07/04/16   tandem and ring applicator to cervix 28 Gy in 5 fractions   History of radiation therapy    Pelvis(sacrum) 05/18/23-06/16/23-Dr. Retta Caster   Hypertension    Hypokalemia    Hypomagnesemia     po supplement and IV replacement   Leukopenia due to antineoplastic chemotherapy (HCC)    Lumbar herniated disc    Pancytopenia due to chemotherapy (HCC)    Periodontitis     PAST SURGICAL HISTORY:  Past Surgical History:  Procedure Laterality Date   ABCESS DRAINAGE Left 1982   breast   CARPAL TUNNEL RELEASE Bilateral    CERVICAL DISC SURGERY  2010   MULTIPLE EXTRACTIONS WITH ALVEOLOPLASTY N/A 04/08/2016   Procedure: Extraction of tooth #'s 3,6,64,40,34 with alveoloplasty  and gross debridement of remaining teeth;  Surgeon: Carol Chroman, DDS;  Location: [facility] OR;  Service: Oral Surgery;  Laterality: N/A;   TANDEM RING INSERTION N/A 05/26/2016   Procedure: TANDEM RING INSERTION;  Surgeon: Retta Caster, MD;  Location: [facility];  Service: Urology;  Laterality: N/A;   TANDEM RING INSERTION N/A 06/02/2016   Procedure: TANDEM RING INSERTION;  Surgeon: Retta Caster, MD;  Location: [facility];  Service: Urology;  Laterality: N/A;   TANDEM RING INSERTION N/A 06/20/2016   Procedure: TANDEM RING INSERTION;  Surgeon: Retta Caster, MD;  Location: [facility];  Service: Urology;  Laterality: N/A;   TANDEM RING INSERTION N/A 06/23/2016   Procedure: TANDEM RING INSERTION;  Surgeon: Retta Caster, MD;  Location[facility];  Service: Urology;  Laterality: N/A;   TANDEM RING INSERTION N/A 07/04/2016   Procedure: TANDEM RING INSERTION;  Surgeon: Retta Caster, MD;  Location: [facility];  Service: Urology;  Laterality: N/A;    HEMATOLOGY/ONCOLOGY HISTORY:  Oncology History  Cervical cancer (HCC)  02/20/2016 Initial Diagnosis   She presented to the ED with vaginal bleeding and abnormal pelvic mass. She was seen in the office by Dr Berdine Bread 03/01/16 who performed cervical biopsies of a friable mass which revealed poorly differentiated squamous cell carcinoma.   03/01/2016 Pathology Results   1. Cervix, biopsy, mass INFILTRATIVE  SQUAMOUS CELL CARCINOMA 2. Cervix, biopsy INVASIVE POORLY DIFFERENTIATED SQUAMOUS CELL CARCINOMA   03/03/2016 Imaging   Pelvic US  was Suboptimal study due to bowel gas. Difficult visualization of the uterus and adnexal structures appear probable multiple fibroids within the uterus. Endometrium thickened at 14 mm. In the setting of post-menopausal bleeding, endometrial sampling is indicated to exclude carcinoma. If results are benign, sonohysterogram should be considered for focal lesion work-up.    03/23/2016 Miscellaneous   Examination by Dr. Pearly Bound in the office concluded the cervix is completely replaced by a 6cm exophytic friable mass which is replacing the cervix and encroaching the upper vaginal fornices and the parametrium bilaterally.   04/04/2016 PET scan   5.5 cm hypermetabolic cervical mass, consistent with known primary cervical carcinoma. No definite local or distant metastatic disease. Sub-cm hypermetabolic left axillary lymph nodes, likely reactive in etiology. Recommend continued attention on follow-up imaging.     04/08/2016 Procedure   Dr. Kae Oram performed Multiple extraction of tooth numbers 3, 9, 21, 30, and 31 .  2 Quadrants of alveoloplasty 3. Gross debridement of remaining dentition      04/12/2016 - 05/09/2016 Chemotherapy   The patient had weekly concurrent chemotherapy with radiation    04/12/2016 - 07/04/2016 Radiation Therapy   Radiation treatment dates:  04/12/16-05/31/16                                                 06/20/16-07/04/16   Site/dose:  1) Pelvis/ 45 Gy in 25 fractions                         2) Pelvis Boost/ 9 Gy in 5 fractions                         3) Cervix / 28 Gy in 5 fractions   Beams/energy:  1) 3D / 15X                                     2) Isodose plan/ 15X                                     3) HDR Ir-192 Cervix / Iridium HDR, tandem/ring applicator   05/16/2016 Adverse Reaction   Week 6 of chemotherapy is placed on hold due to  pancytopenia.   06/29/2016 PET scan   Generally markedly improved in appearance, with resolved masslike appearance of the cervix and dramatic reduction in cervical metabolic activity. Moreover, the left axillary nodes shown on the prior exam are no longer hypermetabolic. 2. However, there is a new small hypermetabolic focus in the right inguinal region corresponding to a suspected lymph node along a vascular confluence, maximum SUV 6.4. This merits surveillance. 3. Other imaging findings of potential clinical significance: Mucous retention  cyst in the left maxillary sinus. Aortic atherosclerosis. Nonobstructive left nephrolithiasis. Spondylosis and degenerative disc disease in the lower lumbar spine.   Leiomyosarcoma (HCC)  04/19/2023 PET scan   CT Biopsy Result Date: 04/27/2023 INDICATION: 64 year old female referred for biopsy destructive sacral mass. EXAM: CT BIOPSY MEDICATIONS: None. ANESTHESIA/SEDATION: Moderate (conscious) sedation was employed during this procedure. A total of Versed  1.0 mg and Fentanyl  100 mcg was administered intravenously. Moderate Sedation Time: 12 minutes. The patient's level of consciousness and vital signs were monitored continuously by radiology nursing throughout the procedure under my direct supervision. FLUOROSCOPY TIME:  CT COMPLICATIONS: None PROCEDURE: Informed written consent was obtained from the patient after a thorough discussion of the procedural risks, benefits and alternatives. All questions were addressed. Maximal Sterile Barrier Technique was utilized including caps, mask, sterile gowns, sterile gloves, sterile drape, hand hygiene and skin antiseptic. A timeout was performed prior to the initiation of the procedure. Patient was positioned left decubitus on the CT gantry table. Scout CT of the pelvis acquired for planning purposes. The patient was then prepped and draped in the usual sterile fashion. 1% lidocaine  was used for local anesthesia. Using CT guidance, 17  gauge guide needle was addressed do's from a posterior approach into the right aspect of the sacral mass. Once we confirmed the cannula position, a 20 cm Chiba needle was carefully passed through the cannula to assure that there was no at risk sacral nerve. The patient did not feel any additional pain upon passage of the Chiba needle. Multiple 18 gauge core biopsy were then acquired comfortably. Needle was removed. Patient tolerated the procedure well and remained hemodynamically stable throughout. No complications were encountered and no significant blood loss. IMPRESSION: Status post CT-guided biopsy of sacral mass. Signed, Marciano Settles. Rexine Cater, RPVI [facility] Radiology Electronically Signed   By: Myrlene Asper D.O.   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 EXAM: NUCLEAR MEDICINE PET SKULL BASE TO THIGH TECHNIQUE: 8.4 mCi F-18 FDG was injected intravenously. Full-ring PET imaging was performed from the skull base to thigh after the radiotracer. CT data was obtained and used for attenuation correction and anatomic localization. Fasting blood glucose: 149 mg/dl COMPARISON:  PET-CT 16/12/9602 FINDINGS: Mediastinal blood pool activity: SUV max 2.3 Liver activity: SUV max NA NECK: Prominent but symmetric glottic/arytenoid activity, maximum SUV 12.8, likely physiologic. Incidental CT findings: Chronic left maxillary sinusitis. Mild bilateral common carotid atheromatous vascular calcifications. CHEST: Small focus of accentuated activity along the cutaneous or immediate subcutaneous surface of the left lower breast as shown on image 84 series 607, maximum SUV 7.5, questionable faint cutaneous thickening or subcutaneous adipose tissue stranding in this vicinity, diagnostic mammographic correlation  suggested. Incidental CT findings: Mild mitral valve calcification. ABDOMEN/PELVIS: No significant abnormal hypermetabolic activity in this region. Incidental CT findings: Atherosclerosis is present, including aortoiliac atherosclerotic disease. SKELETON: Hypermetabolic sacral mass measuring about 8.0 by 4.8 cm, maximum SUV 13.2. I am skeptical that this degree of bony lysis would be due to a benign fracture although there are superimposed pathologic fractures extending through the sacrum. Abnormal erosions are present along the iliac sides of the sacroiliac joints and could be from arthropathy or less likely tumor or infection. These erosions are not as hypermetabolic as the main sacral finding. Vertical fracture of the right L5 transverse process extending partially  into the vertebral body on image 130 series 4, with hypermetabolic activity up to maximum SUV 8.3, more likely to be benign given the linear configuration of the fracture. Chronic fracture of the right pubis without hypermetabolic activity, but with evidence of bony nonunion. Small focus of activity along the right anterior ninth intercostal space just anterior to the right hepatic lobe, maximum SUV 4.3, probably physiologic muscular activity, less likely mid misregistration a rib lesion. Incidental CT findings: None. IMPRESSION: 1. Hypermetabolic sacral mass measuring about 8.0 by 4.8 cm, maximum SUV 13.2. Appearance favors active malignancy given the degree of soft tissue density and infiltration. 2. Abnormal erosions along the iliac sides of the sacroiliac joints, not as hypermetabolic as the main sacral finding. These erosions could be from arthropathy or less likely tumor or infection. 3. Vertical fracture of the right L5 transverse process extending partially into the vertebral body, with hypermetabolic activity up to maximum SUV 8.3, more likely to be benign given the linear configuration of the fracture. 4. Chronic fracture of the right pubis  without hypermetabolic activity, but with evidence of bony nonunion. 5. Small focus of accentuated activity along the cutaneous or immediate subcutaneous surface of the left lower breast, maximum SUV 7.5, questionable faint cutaneous thickening or subcutaneous adipose tissue stranding in this vicinity, diagnostic mammographic correlation suggested. 6. Small focus of activity along the right anterior ninth intercostal space just anterior to the right hepatic lobe, maximum SUV 4.3, probably physiologic muscular activity, less likely mid misregistration an otherwise occult rib lesion. 7. Prominent but symmetric glottic/arytenoid activity, maximum SUV 12.8, likely physiologic. 8. Chronic left maxillary sinusitis. 9. Aortic atherosclerosis. Electronically Signed   By: Freida Jes M.D.   On: 04/27/2023 10:40      04/27/2023 Pathology Results   SURGICAL PATHOLOGY  CASE: [id number]  PATIENT: Zuleica Wohlers  Surgical Pathology Report   Clinical History: suspect mets (cm)   FINAL MICROSCOPIC DIAGNOSIS:   A. 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  See comment.   COMMENT:  The core biopsies show a spindle cell neoplasm characterized by fascicles of elongated cells with nuclear pleomorphism scattered mitotic figures.  There is a microscopic focus suspicious for lymphovascular involvement.  Immunohistochemistry shows focal, patchy positivity with smooth muscle actin and negative staining with desmin cytokeratin AE1/AE3, cytokeratin 5/6, cytokeratin 903 and S100.  The morphology and focal smooth muscle actin positivity favors high-grade leiomyosarcoma    05/16/2023 Initial Diagnosis   Leiomyosarcoma (HCC)   05/16/2023 Cancer Staging   Staging form: Soft Tissue Sarcoma, AJCC 7th Edition - Clinical stage from 05/16/2023: Stage IV (T2, N0, M1) - Signed by Almeda Jacobs, MD on 05/16/2023 Stage prefix: Initial diagnosis Biopsy of metastatic site  performed: Yes Source of metastatic specimen: Bone   05/26/2023 Echocardiogram       1. Left ventricular ejection fraction, by estimation, is 55 to 60%. The left ventricle has normal function. The left ventricle has no regional wall motion abnormalities. Left ventricular diastolic parameters were normal.  2. Right ventricular systolic function is normal. The right ventricular size is normal.  3. The mitral valve is normal in structure. No evidence of mitral valve regurgitation. No evidence of mitral stenosis.  4. The aortic valve is tricuspid. Aortic valve regurgitation is not visualized. No aortic stenosis is present.  5. The inferior vena cava is normal in size with greater than 50% respiratory variability, suggesting right atrial pressure of 3 mmHg.   05/30/2023 - 05/30/2023 Chemotherapy  Patient is on Treatment Plan : UTERINE LEIOMYOSARCOMA Doxorubicin (75) q21d x 6 Cycles       ALLERGIES:  is allergic to codeine, fentanyl , hydrocodone -acetaminophen , and tramadol.  MEDICATIONS:  Current Outpatient Medications  Medication Sig Dispense Refill   dexamethasone  (DECADRON ) 4 MG tablet Take 1 tablet (4 mg total) by mouth 2 (two) times daily. 20 tablet 0   lidocaine  (LIDODERM ) 5 % Place 1 patch onto the skin daily. Remove & Discard patch within 12 hours or as directed by MD 30 patch 3   Menthol , Topical Analgesic, (BIOFREEZE) 10 % CREA Apply 1 Application topically 3 (three) times daily as needed. 85 g 3   pregabalin (LYRICA) 50 MG capsule Take 1 capsule (50 mg total) by mouth 2 (two) times daily. 60 capsule 2   ALPRAZolam  (XANAX ) 0.25 MG tablet Take 0.5 tablets (0.125 mg total) by mouth 2 (two) times daily as needed for anxiety. 30 tablet 0   calcium -vitamin D  (OSCAL WITH D) 500-5 MG-MCG tablet Take 1 tablet by mouth 2 (two) times daily with a meal. 60 tablet 0   cetirizine  (ZYRTEC ) 10 MG tablet TAKE 1 TABLET BY MOUTH EVERY DAY (Patient taking differently: Take 10 mg by mouth daily.) 90 tablet 0    cyclobenzaprine  (FLEXERIL ) 10 MG tablet Take 10 mg by mouth 3 (three) times daily as needed for muscle spasms.     fluticasone  (FLONASE ) 50 MCG/ACT nasal spray INSTILL 1 SPRAY INTO EACH NOSTRIL DAILY (Patient taking differently: Place 1 spray into both nostrils daily.) 48 mL 0   ibuprofen  (ADVIL ) 400 MG tablet Take 1 tablet (400 mg total) by mouth every 6 (six) hours as needed for mild pain (pain score 1-3) (or Fever >/= 101).     [Paused] lisinopril -hydrochlorothiazide  (ZESTORETIC ) 20-25 MG tablet Take 1 tablet by mouth daily. 30 tablet 0   methadone  (DOLOPHINE ) 10 MG tablet Take 7 tablets (70 mg) in the morning, 4 tablets (40 mg) in the afternoon and 4 tablets (40 mg) in the evening 225 tablet 0   omeprazole  (PRILOSEC) 20 MG capsule Take 1 capsule (20 mg total) by mouth daily. Must have office visit for refills 90 capsule 0   oxycodone  (ROXICODONE ) 30 MG immediate release tablet Take 1 tablet (30 mg total) by mouth every 4 (four) hours as needed. 90 tablet 0   VOLTAREN  1 % GEL Apply 4 g topically 4 (four) times daily. (Patient not taking: Reported on 06/09/2023) 100 g 0   No current facility-administered medications for this visit.    VITAL SIGNS: BP (!) 152/84 (BP Location: Left Arm, Patient Position: Sitting)   Pulse (!) 109   Temp 98.9 F (37.2 C) (Temporal)   Resp 18   Wt 155 lb 12.8 oz (70.7 kg)   SpO2 96%   BMI 28.96 kg/m  Filed Weights   08/07/23 1350  Weight: 155 lb 12.8 oz (70.7 kg)    Estimated body mass index is 28.96 kg/m as calculated from the following:   Height as of 07/17/23: 5' 1.5" (1.562 m).   Weight as of this encounter: 155 lb 12.8 oz (70.7 kg).  LABS: CBC:    Component Value Date/Time   WBC 8.6 06/09/2023 0607   HGB 10.8 (L) 06/09/2023 0607   HGB 12.3 02/10/2020 1416   HGB 10.9 (L) 08/15/2016 1614   HCT 36.5 06/09/2023 0607   HCT 34.1 (L) 08/15/2016 1614   PLT 291 06/09/2023 0607   PLT 265 02/10/2020 1416   PLT 196 08/15/2016 1614  MCV 91.3  06/09/2023 0607   MCV 93.2 08/15/2016 1614   NEUTROABS 5.5 02/10/2020 1416   NEUTROABS 2.3 08/15/2016 1614   LYMPHSABS 1.8 02/10/2020 1416   LYMPHSABS 0.8 (L) 08/15/2016 1614   MONOABS 0.5 02/10/2020 1416   MONOABS 0.3 08/15/2016 1614   EOSABS 0.1 02/10/2020 1416   EOSABS 0.1 08/15/2016 1614   BASOSABS 0.0 02/10/2020 1416   BASOSABS 0.0 08/15/2016 1614   Comprehensive Metabolic Panel:    Component Value Date/Time   NA 137 06/09/2023 0607   NA 143 08/15/2016 1614   K 3.9 06/09/2023 0607   K 4.3 08/15/2016 1614   CL 97 (L) 06/09/2023 0607   CO2 29 06/09/2023 0607   CO2 31 (H) 08/15/2016 1614   BUN 16 06/09/2023 0607   BUN 13.5 08/15/2016 1614   CREATININE 0.89 06/09/2023 0607   CREATININE 0.99 02/11/2020 1401   CREATININE 0.9 08/15/2016 1614   GLUCOSE 84 06/09/2023 0607   GLUCOSE 113 08/15/2016 1614   CALCIUM  9.2 06/09/2023 0607   CALCIUM  9.3 08/15/2016 1614   AST 18 06/09/2023 0607   AST 36 02/11/2020 1401   AST 18 06/29/2016 0921   ALT 25 06/09/2023 0607   ALT 34 02/11/2020 1401   ALT 12 06/29/2016 0921   ALKPHOS 55 06/09/2023 0607   ALKPHOS 76 06/29/2016 0921   BILITOT 0.8 06/09/2023 0607   BILITOT 0.4 02/11/2020 1401   BILITOT 0.46 06/29/2016 0921   PROT 6.9 06/09/2023 0607   PROT 7.2 06/29/2016 0921   ALBUMIN 2.7 (L) 06/09/2023 0607   ALBUMIN 3.7 06/29/2016 0921    RADIOGRAPHIC STUDIES: No results found.  PERFORMANCE STATUS (ECOG) : 2 - Symptomatic, <50% confined to bed  Review of Systems  Constitutional:  Positive for fatigue.  Musculoskeletal:  Positive for back pain.       Left leg pain, numbness, tingling  Unless otherwise noted, a complete review of systems is negative.  Physical Exam General: NAD Cardiovascular: regular rate and rhythm Pulmonary: clear ant fields Abdomen: soft, nontender, + bowel sounds Extremities: no edema, no joint deformities Skin: no rashes Neurological: Alert and oriented x3  IMPRESSION: Discussed the use of AI  scribe software for clinical note transcription with the patient, who gave verbal consent to proceed.  History of Present Illness Valerie Kerr is a 63 year old female with stage 4 cancer with bone mets who presents for her initial palliative visit.  Patient previously referred for pain management back in February however recently now engaged due to pain needs.  Patient's daughter Blease Burden is present.  Mr. Spencer is ambulatory with rollator.  She is alert and able to engage appropriately in discussions.  During visit patient states pain is severe causing her distress when sitting resulting in her desires to stand throughout visit.  Of note patient has been seen by inpatient palliative team during previous hospitalization.  I introduced myself, Maygan RN, and Palliative's role in collaboration with the oncology team. Concept of Palliative Care was introduced as specialized medical care for people and their families living with serious illness.  It focuses on providing relief from the symptoms and stress of a serious illness.  The goal is to improve quality of life for both the patient and the family. Values and goals of care important to patient and family were attempted to be elicited.    Ms. Arciaga reports she resides in [place]  and however due to her health challenges she has moved in with her daughter here in [place] for  support.  Patient is disabled.  Divorced.  5 children.  At home she is able to perform most ADLs independently however with limitations due to severe pain and fatigue.  Patient ambulates with a rollator.  States her pain requires her to lay down and sitting up worsens her discomfort.  She has a history of sciatic nerve pain, previously managed with cortisone shots, which provided some relief. She also has a history of cervical cancer treated in 2018 and was diagnosed with stage four bone cancer in her back after moving from [place]  to [facility] in January. She has  undergone radiation therapy but not chemotherapy.  Ms. Ertz reports experiencing severe neuropathic pain described as feeling like 'bubbles under my feet' and 'being stabbed, like an electrical current going through it.' The pain is primarily located in her heel, which is also slightly red, and extends from her lower back down to her left leg, now affecting her right leg as well. The pain is worse than her previous sciatic nerve pain, making it difficult for her to sit or bend. It is exacerbated by certain movements and weather changes, and she rates it as higher than ten on a scale of one to ten at its worst.  She reports numbness, burning, and tingling sensations primarily in her left upper thigh, which worsen when sitting. She uses a rollator for mobility at home. No issues with constipation, diarrhea, nausea, vomiting, or appetite loss, noting a recent weight increase from 147 to 155 pounds.  Her current medications include oxycodone  30 mg every four hours as needed, methadone  70 mg in the morning, 40 mg in the afternoon, and 40 mg at bedtime, Xanax  0.25 mg twice a day as needed, vitamin D , Zyrtec , Flexeril  (rarely taken), Flonase , lidocaine  patch, ibuprofen , omeprazole , and Voltaren  cream. She also uses apricot kernel supplements for cancer management and vegetable tablets for stool softening.  Gladyse reports her pain is somewhat controlled on current regimen.  States some days are better than others.  At this time we will continue with current regimen of methadone  3 times daily and oxycodone  as needed for breakthrough pain.  Patient initially states she is not interested in considering gabapentin  due to previous interactions that caused her feelings of drowsiness.  Education provided on neuropathic pain and symptom management which would not be controlled or managed with methadone  or oxycodone .  She verbalized understanding.  Will start patient on pregabalin 50 mg twice daily for her neuropathic  discomfort.  We will continue to closely monitor and adjust medications as needed.  Complete physical, medication, medical, and psychosocial review completed.  Patient denies any use of illicit drugs or alcohol.  She reports previous history of marijuana use however has not engaged in over 20 years.  Understands if ever positive for certain illicit drugs or alcohol this would be grounds for no longer receiving opioid prescriptions from myself. Extensive discussions and explanation of palliative's role in collaboration with his oncology team to assist in his pain and symptom management. Education provided pain contract and guidelines for ongoing support including terms for dismissal if contract is broken. She verbalized understanding. Pain contract completed.    Patient is inquiring about potential injection for her left leg pain.  Advised we will consider referral to Ortho or pain management to allow evaluation of any additional pain interventions such as injections.  I discussed the importance of continued conversation with family and their medical providers regarding overall plan of care and treatment options, ensuring decisions are within  the context of the patients values and GOCs. Assessment & Plan Established therapeutic relationship. Education provided on palliative's role in collaboration with their Oncology/Radiation team.  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 Neuropathy with burning, numbness, and tingling. Lyrica considered for neuropathy symptoms. - Prescribe Lyrica 50 mg one tablet twice daily, avoid within 1-2 hours of methadone . - Continue methadone  70 mg morning, 40 mg afternoon, 40 mg bedtime. - Continue oxycodone  30 mg every 4 hours as needed. - Prescribe dexamethasone  4 mg -one tablet twice daily for 10 days. -Lidocaine  patch to lower back  - Send Biofreeze prescription to pharmacy, insurance  coverage uncertain. - Pain contract completed.  Copy provided to patient.  Sciatica Chronic sciatica with burning, numbness, and tingling. Previous cortisone injections provided relief. - Consider referral to neurologist or orthopedic doctor for cortisone injection.  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 Current regimen of oxycodone  and methadone  tolerated.  Goals of Care Focus on pain management and quality of life. Palliative care provided, not hospice. Aim for effective pain management, acknowledging complete relief may not be possible. - Adhere to pain management plan, adjust based on pain levels and response.  Follow-up First meeting with provider. Plan to establish regular follow-up for pain management monitoring. - Schedule follow-up in two weeks. - Ensure prescriptions sent to [facility]. - Discuss changes in pain or new symptoms.  Patient expressed understanding and was in agreement with this plan. She also understands that She can call the clinic at any time with any questions, concerns, or complaints.   Thank you for your referral and allowing Palliative to assist in Mrs. Cornelius Dill Timpone's care.   Number and complexity of problems addressed: HIGH - 1 or more chronic illnesses with SEVERE exacerbation, progression, or side effects of treatment - advanced cancer, pain. Any controlled substances utilized were prescribed in the context of palliative care.  Visit consisted of counseling and education dealing with the complex and emotionally intense issues of symptom management and palliative care in the setting of serious and potentially life-threatening illness.  Signed by: Dellia Ferguson, AGPCNP-BC Palliative Medicine Team/[facility]

## 2023-08-08 ENCOUNTER — Other Ambulatory Visit (HOSPITAL_COMMUNITY): Payer: Self-pay

## 2023-08-11 ENCOUNTER — Other Ambulatory Visit (HOSPITAL_COMMUNITY): Payer: Self-pay

## 2023-08-17 ENCOUNTER — Telehealth: Payer: Self-pay

## 2023-08-17 NOTE — Telephone Encounter (Signed)
 T LVM asking for appt confirmation.RN attempted to call back, no answer, LVM with appt details,

## 2023-08-24 ENCOUNTER — Inpatient Hospital Stay (HOSPITAL_BASED_OUTPATIENT_CLINIC_OR_DEPARTMENT_OTHER): Admitting: Nurse Practitioner

## 2023-08-24 ENCOUNTER — Other Ambulatory Visit (HOSPITAL_COMMUNITY): Payer: Self-pay

## 2023-08-24 ENCOUNTER — Encounter: Payer: Self-pay | Admitting: Nurse Practitioner

## 2023-08-24 VITALS — BP 169/101 | Temp 97.7°F | Resp 20 | Wt 153.9 lb

## 2023-08-24 DIAGNOSIS — C419 Malignant neoplasm of bone and articular cartilage, unspecified: Secondary | ICD-10-CM | POA: Diagnosis not present

## 2023-08-24 DIAGNOSIS — K5903 Drug induced constipation: Secondary | ICD-10-CM

## 2023-08-24 DIAGNOSIS — G893 Neoplasm related pain (acute) (chronic): Secondary | ICD-10-CM | POA: Diagnosis not present

## 2023-08-24 DIAGNOSIS — C7951 Secondary malignant neoplasm of bone: Secondary | ICD-10-CM

## 2023-08-24 DIAGNOSIS — Z515 Encounter for palliative care: Secondary | ICD-10-CM | POA: Diagnosis not present

## 2023-08-24 MED ORDER — OXYCODONE HCL 30 MG PO TABS
30.0000 mg | ORAL_TABLET | ORAL | 0 refills | Status: DC | PRN
  Filled 2023-08-24: qty 90, 15d supply, fill #0

## 2023-08-24 MED ORDER — KETOROLAC TROMETHAMINE 10 MG PO TABS
10.0000 mg | ORAL_TABLET | Freq: Three times a day (TID) | ORAL | 0 refills | Status: DC | PRN
  Filled 2023-08-24: qty 18, 6d supply, fill #0

## 2023-08-24 MED ORDER — METHADONE HCL 10 MG PO TABS
ORAL_TABLET | ORAL | 0 refills | Status: DC
  Filled 2023-08-24: qty 225, 15d supply, fill #0

## 2023-08-24 NOTE — Progress Notes (Signed)
 Palliative Medicine [facility]  Telephone:(336) [phone] Fax:(336) [phone]   Name: Valerie Kerr Date: 08/24/2023 MRN: [id number]  DOB: 11/07/1960  Patient Care Team: Joaquin Mulberry, MD as PCP - General (Family Medicine) Pickenpack-Cousar, Giles Labrum, NP as Nurse Practitioner (Hospice and Palliative Medicine)    INTERVAL HISTORY: Valerie Kerr is a 63 y.o. 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  Palliative is seeing patient for symptom management and goals of care.   SOCIAL HISTORY:     reports that she has been smoking cigarettes. She has a 9 pack-year smoking history. She has never used smokeless tobacco. She reports that she does not drink alcohol and does not use drugs.  ADVANCE DIRECTIVES:  None on file   CODE STATUS: Full code  PAST MEDICAL HISTORY: Past Medical History:  Diagnosis Date   Anxiety    Cervical cancer [facility]) oncologist-  dr gorsuch/ dr Eloise Hake   02-20-2016 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---  treatment concurrent chemo (week 6 on hold due to pancytopenia) and radiation therapy (external beam 04-12-2016 to 05-31-2016)  started high-dose rate brachytherapy 05-26-2016   Chemotherapy-induced thrombocytopenia    Chronic pain disorder    back,neck-- chronic methadone    Environmental allergies    allergy to dust mites   GERD (gastroesophageal reflux disease)    Headache    Herniated disc, cervical    History of external beam radiation therapy 04/12/16-05/31/16   pelvis 45 Gy in 25 fractions, in  30 sessions, pelvis boost 9 Gy in 5 fractions   History of radiation therapy 06/20/16-07/04/16   tandem and ring applicator to cervix 28 Gy in 5 fractions   History of radiation therapy    Pelvis(sacrum)  05/18/23-06/16/23-Dr. Retta Caster   Hypertension    Hypokalemia    Hypomagnesemia    po supplement and IV replacement   Leukopenia due to antineoplastic chemotherapy (HCC)    Lumbar herniated disc    Pancytopenia due to chemotherapy (HCC)    Periodontitis     ALLERGIES:  is allergic to codeine, fentanyl , hydrocodone -acetaminophen , and tramadol.  MEDICATIONS:  Current Outpatient Medications  Medication Sig Dispense Refill   ketorolac  (TORADOL ) 10 MG tablet Take 1 tablet (10 mg total) by mouth every 8 (eight) hours as needed for severe pain (pain score 7-10). 45 tablet 0   ALPRAZolam  (XANAX ) 0.25 MG tablet Take 0.5 tablets (0.125 mg total) by mouth 2 (two) times daily as needed for anxiety. 30 tablet 0   calcium -vitamin D  (OSCAL WITH D) 500-5 MG-MCG tablet Take 1 tablet by mouth 2 (two) times daily with a meal. 60 tablet 0   cetirizine  (ZYRTEC ) 10 MG tablet TAKE 1 TABLET BY MOUTH EVERY DAY (Patient taking differently: Take 10 mg by mouth daily.) 90 tablet 0   cyclobenzaprine  (FLEXERIL ) 10 MG tablet Take 10 mg by mouth 3 (three) times daily as needed for muscle spasms.     dexamethasone  (DECADRON ) 4 MG tablet Take 1 tablet (4 mg total) by mouth 2 (two) times daily. 20 tablet 0   fluticasone  (FLONASE ) 50 MCG/ACT nasal spray INSTILL 1 SPRAY INTO EACH NOSTRIL DAILY (Patient taking differently: Place 1 spray into both nostrils daily.) 48 mL 0   lidocaine  (LIDODERM ) 5 % Place 1 patch onto the skin daily. Remove & Discard patch within 12 hours  or as directed by MD 30 patch 3   [Paused] lisinopril -hydrochlorothiazide  (ZESTORETIC ) 20-25 MG tablet Take 1 tablet by mouth daily. 30 tablet 0   Menthol , Topical Analgesic, (BIOFREEZE) 10 % CREA Apply 1 Application topically 3 (three) times daily as needed. 85 g 3   methadone  (DOLOPHINE ) 10 MG tablet Take 7 tablets (70 mg) in the morning, 4 tablets (40 mg) in the afternoon and 4 tablets (40 mg) in the evening 225 tablet 0   omeprazole  (PRILOSEC) 20 MG  capsule Take 1 capsule (20 mg total) by mouth daily. Must have office visit for refills 90 capsule 0   oxycodone  (ROXICODONE ) 30 MG immediate release tablet Take 1 tablet (30 mg total) by mouth every 4 (four) hours as needed. 90 tablet 0   pregabalin  (LYRICA ) 50 MG capsule Take 1 capsule (50 mg total) by mouth 2 (two) times daily. 60 capsule 2   VOLTAREN  1 % GEL Apply 4 g topically 4 (four) times daily. (Patient not taking: Reported on 06/09/2023) 100 g 0   No current facility-administered medications for this visit.    VITAL SIGNS: BP (!) 169/101   Temp 97.7 F (36.5 C)   Resp 20   Wt 153 lb 14.4 oz (69.8 kg)   SpO2 96%   BMI 28.61 kg/m  Filed Weights   08/24/23 1320  Weight: 153 lb 14.4 oz (69.8 kg)    Estimated body mass index is 28.61 kg/m as calculated from the following:   Height as of 07/17/23: 5' 1.5" (1.562 m).   Weight as of this encounter: 153 lb 14.4 oz (69.8 kg).   PERFORMANCE STATUS (ECOG) : 1 - Symptomatic but completely ambulatory  Physical Exam General: NAD Cardiovascular: regular rate and rhythm Pulmonary: normal breathing pattern Extremities: no edema, no joint deformities Skin: no rashes Neurological: AAO x3  IMPRESSION: Discussed the use of AI scribe software for clinical note transcription with the patient, who gave verbal consent to proceed.  History of Present Illness Valerie Kerr is a 63 year old female with stage four leiomyosarcoma of the sacrum with bone mets who presents to clinic for symptom management follow-up. Accompanied by her daughter. Denies concerns for nausea, vomiting, or diarrhea. Occasional constipation. Describing her bowel movements as 'little turds' and hard. She has been using a stool softener in pill form but not consistently. She plans to resume daily use to alleviate her symptoms. Education provided on importance of bowel regimen in the setting of opioid use. Patient recommended to consistently take her Senna-S 2 tablets at  bedtime.   Her current pain management regimen is not adequately controlling her symptoms. She takes Lyrica  twice a day, which intensifies her nerve pain rather than alleviating it. Previously, she experienced relief from prednisone . Her nerve pain is severe, particularly when sitting down, causing her to scream out in pain. Understands we cannot use long-term steroids. Patient instructed to increase Lyrica  to 2 capsules (100mg ) twice daily. Will also do a short course of ketorolac  to see how she does. Encouraged taking medication with food.   Valerie Kerr is currently taking oxycodone  as needed for breakthrough pain, methadone  three times daily. Tolerating without difficulty. Taking medications as prescribed upon pill count review. She questions about follow-up visit and difficulty making appointments. Patient educated on importance of appointments in order to continue with pain management. She is aware if she does not show of continue with follow-up visits I will no longer continue prescribing medications. Patient and daughter verbalized.   Patient also  has found some relief with use of Biofreeze cream.   No new symptoms. Will continue to closely monitor and support.   I discussed the importance of continued conversation with family and their medical providers regarding overall plan of care and treatment options, ensuring decisions are within the context of the patients values and GOCs. Assessment & Plan Stage 4 cancer Stage 4 cancer with nerve pain. Focus on pain control and managing medication side effects. Discussed reducing visit frequency with alternating in-person and telephone appointments. - Refill oxycodone  prescription. - Prescribe ketorolac , one tablet every eight hours as needed for pain. - Schedule alternating in-person and telephone appointments every four to six weeks.  Nerve pain Nerve pain possibly worsened by current Lyrica  dosage. Prednisone  effective but unsuitable long-term.  Plan to increase Lyrica  to evaluate efficacy. - Increase Lyrica  50mg  to two capsules twice a day. - Instruct to call if increased Lyrica  worsens symptoms. - Prescribe Biofreeze cream for topical pain relief.  Constipation Intermittent constipation with hard stools. Inconsistent use of stool softeners. Advised daily use to improve bowel movements. - Advise taking stool softener daily.  I will plan to see patient back in 4-6 weeks. Sooner if needed.   Patient expressed understanding and was in agreement with this plan. She also understands that She can call the clinic at any time with any questions, concerns, or complaints.   Any controlled substances utilized were prescribed in the context of palliative care. PDMP has been reviewed.   Visit consisted of counseling and education dealing with the complex and emotionally intense issues of symptom management and palliative care in the setting of serious and potentially life-threatening illness.  Dellia Ferguson, AGPCNP-BC  Palliative Medicine Team/[facility]

## 2023-08-25 ENCOUNTER — Other Ambulatory Visit (HOSPITAL_COMMUNITY): Payer: Self-pay

## 2023-08-25 ENCOUNTER — Other Ambulatory Visit: Payer: Self-pay

## 2023-08-25 NOTE — Telephone Encounter (Signed)
 Pt called and requested a refill of ibuprofen , pt educated on the use of ibuprofen  and Toradol  and the bleed risk. Pt verbalized understanding not to use both medications. Pt verbalized that she does not want the Toradol  because it makes her drowsy. Pt instructed to bring in toradol  script to dispose of properly before an ibuprofen  script will be sent in. Pt verbalized understanding and agreement with plan.

## 2023-08-26 ENCOUNTER — Other Ambulatory Visit (HOSPITAL_COMMUNITY): Payer: Self-pay

## 2023-08-28 ENCOUNTER — Other Ambulatory Visit (HOSPITAL_COMMUNITY): Payer: Self-pay

## 2023-08-28 ENCOUNTER — Telehealth: Payer: Self-pay

## 2023-08-28 ENCOUNTER — Other Ambulatory Visit: Payer: Self-pay | Admitting: Nurse Practitioner

## 2023-08-28 ENCOUNTER — Other Ambulatory Visit (HOSPITAL_BASED_OUTPATIENT_CLINIC_OR_DEPARTMENT_OTHER): Payer: Self-pay

## 2023-08-28 DIAGNOSIS — C7951 Secondary malignant neoplasm of bone: Secondary | ICD-10-CM

## 2023-08-28 DIAGNOSIS — C419 Malignant neoplasm of bone and articular cartilage, unspecified: Secondary | ICD-10-CM

## 2023-08-28 DIAGNOSIS — Z515 Encounter for palliative care: Secondary | ICD-10-CM

## 2023-08-28 DIAGNOSIS — G893 Neoplasm related pain (acute) (chronic): Secondary | ICD-10-CM

## 2023-08-28 MED ORDER — IBUPROFEN 600 MG PO TABS
600.0000 mg | ORAL_TABLET | Freq: Three times a day (TID) | ORAL | 1 refills | Status: DC | PRN
Start: 2023-08-28 — End: 2023-09-25
  Filled 2023-08-28: qty 60, 20d supply, fill #0

## 2023-08-28 NOTE — Telephone Encounter (Signed)
 Pt daughter brought back toradol  pills and ibuprofen  script was sent into pharmacy per pt request to no longer take toradol  and to take ibuprofen  instead. Medication safely discarded with Landa Pine, NP.

## 2023-08-29 ENCOUNTER — Other Ambulatory Visit (HOSPITAL_COMMUNITY): Payer: Self-pay

## 2023-08-30 ENCOUNTER — Other Ambulatory Visit: Payer: Self-pay

## 2023-08-30 ENCOUNTER — Other Ambulatory Visit (HOSPITAL_BASED_OUTPATIENT_CLINIC_OR_DEPARTMENT_OTHER): Payer: Self-pay

## 2023-08-30 ENCOUNTER — Other Ambulatory Visit (HOSPITAL_COMMUNITY): Payer: Self-pay

## 2023-08-30 DIAGNOSIS — C419 Malignant neoplasm of bone and articular cartilage, unspecified: Secondary | ICD-10-CM

## 2023-08-30 DIAGNOSIS — G893 Neoplasm related pain (acute) (chronic): Secondary | ICD-10-CM

## 2023-08-30 DIAGNOSIS — C7951 Secondary malignant neoplasm of bone: Secondary | ICD-10-CM

## 2023-08-30 DIAGNOSIS — Z515 Encounter for palliative care: Secondary | ICD-10-CM

## 2023-08-30 MED ORDER — DEXAMETHASONE 4 MG PO TABS
4.0000 mg | ORAL_TABLET | Freq: Two times a day (BID) | ORAL | 0 refills | Status: DC
  Filled 2023-08-30: qty 20, 10d supply, fill #0

## 2023-08-30 NOTE — Telephone Encounter (Signed)
 pt called for medication refill, see associated orders.

## 2023-08-31 ENCOUNTER — Other Ambulatory Visit: Payer: Self-pay

## 2023-09-13 ENCOUNTER — Other Ambulatory Visit: Payer: Self-pay

## 2023-09-13 DIAGNOSIS — Z515 Encounter for palliative care: Secondary | ICD-10-CM

## 2023-09-13 DIAGNOSIS — G893 Neoplasm related pain (acute) (chronic): Secondary | ICD-10-CM

## 2023-09-13 DIAGNOSIS — C419 Malignant neoplasm of bone and articular cartilage, unspecified: Secondary | ICD-10-CM

## 2023-09-13 DIAGNOSIS — C7951 Secondary malignant neoplasm of bone: Secondary | ICD-10-CM

## 2023-09-13 MED ORDER — METHADONE HCL 10 MG PO TABS
ORAL_TABLET | ORAL | 0 refills | Status: DC
  Filled 2023-09-13: qty 225, 15d supply, fill #0

## 2023-09-13 MED ORDER — OXYCODONE HCL 30 MG PO TABS
30.0000 mg | ORAL_TABLET | ORAL | 0 refills | Status: DC | PRN
  Filled 2023-09-13: qty 90, 15d supply, fill #0

## 2023-09-13 NOTE — Telephone Encounter (Signed)
 Pt called for medication refills, discussed the use of prescribed medications, especially the lyrica . All questions answered, no further needs at this time.

## 2023-09-14 ENCOUNTER — Other Ambulatory Visit: Payer: Self-pay

## 2023-09-14 ENCOUNTER — Other Ambulatory Visit (HOSPITAL_COMMUNITY): Payer: Self-pay

## 2023-09-15 ENCOUNTER — Other Ambulatory Visit (HOSPITAL_COMMUNITY): Payer: Self-pay

## 2023-09-25 ENCOUNTER — Other Ambulatory Visit: Payer: Self-pay

## 2023-09-25 ENCOUNTER — Other Ambulatory Visit (HOSPITAL_COMMUNITY): Payer: Self-pay

## 2023-09-25 DIAGNOSIS — C419 Malignant neoplasm of bone and articular cartilage, unspecified: Secondary | ICD-10-CM

## 2023-09-25 DIAGNOSIS — Z515 Encounter for palliative care: Secondary | ICD-10-CM

## 2023-09-25 DIAGNOSIS — G893 Neoplasm related pain (acute) (chronic): Secondary | ICD-10-CM

## 2023-09-25 DIAGNOSIS — C7951 Secondary malignant neoplasm of bone: Secondary | ICD-10-CM

## 2023-09-25 MED ORDER — IBUPROFEN 600 MG PO TABS
600.0000 mg | ORAL_TABLET | Freq: Three times a day (TID) | ORAL | 1 refills | Status: AC | PRN
  Filled 2023-09-25: qty 60, 20d supply, fill #0

## 2023-09-25 NOTE — Telephone Encounter (Signed)
 Pt called for refill, see associated orders.

## 2023-09-28 ENCOUNTER — Inpatient Hospital Stay: Attending: Hematology and Oncology | Admitting: Nurse Practitioner

## 2023-09-28 ENCOUNTER — Other Ambulatory Visit (HOSPITAL_COMMUNITY): Payer: Self-pay

## 2023-09-28 ENCOUNTER — Encounter: Payer: Self-pay | Admitting: Nurse Practitioner

## 2023-09-28 DIAGNOSIS — Z515 Encounter for palliative care: Secondary | ICD-10-CM

## 2023-09-28 DIAGNOSIS — C419 Malignant neoplasm of bone and articular cartilage, unspecified: Secondary | ICD-10-CM

## 2023-09-28 DIAGNOSIS — C7951 Secondary malignant neoplasm of bone: Secondary | ICD-10-CM | POA: Diagnosis not present

## 2023-09-28 DIAGNOSIS — G893 Neoplasm related pain (acute) (chronic): Secondary | ICD-10-CM | POA: Diagnosis not present

## 2023-09-28 DIAGNOSIS — M792 Neuralgia and neuritis, unspecified: Secondary | ICD-10-CM

## 2023-09-28 MED ORDER — PREGABALIN 100 MG PO CAPS
100.0000 mg | ORAL_CAPSULE | Freq: Two times a day (BID) | ORAL | 1 refills | Status: DC
  Filled 2023-09-28: qty 60, 30d supply, fill #0
  Filled 2023-12-07: qty 60, 30d supply, fill #1

## 2023-09-28 MED ORDER — OXYCODONE HCL 30 MG PO TABS
30.0000 mg | ORAL_TABLET | ORAL | 0 refills | Status: DC | PRN
  Filled 2023-09-28: qty 90, 15d supply, fill #0

## 2023-09-28 MED ORDER — METHADONE HCL 10 MG PO TABS
ORAL_TABLET | ORAL | 0 refills | Status: DC
  Filled 2023-09-28: qty 225, 15d supply, fill #0

## 2023-09-28 NOTE — Progress Notes (Addendum)
 Palliative Medicine [facility]  Telephone:(336) [phone] Fax:(336) [phone]   Name: Valerie Kerr Date: 09/28/2023 MRN: [id number]  DOB: 12/12/1960  Patient Care Team: Delbert Clam, MD as PCP - General (Family Medicine) Pickenpack-Cousar, Fannie SAILOR, NP as Nurse Practitioner [facility] and Palliative Medicine)   I connected with [name] on 09/28/23 at 11:00 AM EDT by telephone and verified that I am speaking with the correct person using two identifiers.   I discussed the limitations, risks, security and privacy concerns of performing an evaluation and management service by telemedicine and the availability of in-person appointments. I also discussed with the patient that there may be a patient responsible charge related to this service. The patient expressed understanding and agreed to proceed.   Other persons participating in the visit and their role in the encounter: N/A   Patient's location: Home   Provider's location: [facility]   INTERVAL HISTORY: Valerie Kerr is a 63 y.o. 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  Palliative is seeing patient for symptom management and goals of care.   SOCIAL HISTORY:     reports that she has been smoking cigarettes. She has a 9 pack-year smoking history. She has never used smokeless tobacco. She reports that she does not drink alcohol and does not use drugs.  ADVANCE DIRECTIVES:  None on file   CODE STATUS: Full code  PAST MEDICAL HISTORY: Past Medical History:  Diagnosis Date   Anxiety    Cervical cancer [facility]) oncologist-  dr gorsuch/ dr shannon   02-20-2016 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---  treatment concurrent chemo (week 6 on hold due to pancytopenia) and radiation  therapy (external beam 04-12-2016 to 05-31-2016)  started high-dose rate brachytherapy 05-26-2016   Chemotherapy-induced thrombocytopenia    Chronic pain disorder    back,neck-- chronic methadone    Environmental allergies    allergy to dust mites   GERD (gastroesophageal reflux disease)    Headache    Herniated disc, cervical    History of external beam radiation therapy 04/12/16-05/31/16   pelvis 45 Gy in 25 fractions, in  30 sessions, pelvis boost 9 Gy in 5 fractions   History of radiation therapy 06/20/16-07/04/16   tandem and ring applicator to cervix 28 Gy in 5 fractions   History of radiation therapy    Pelvis(sacrum) 05/18/23-06/16/23-Dr. Lynwood shannon   Hypertension    Hypokalemia    Hypomagnesemia    po supplement and IV replacement   Leukopenia due to antineoplastic chemotherapy (HCC)    Lumbar herniated disc    Pancytopenia due to chemotherapy (HCC)    Periodontitis     ALLERGIES:  is allergic to codeine, fentanyl , hydrocodone -acetaminophen , and tramadol.  MEDICATIONS:  Current Outpatient Medications  Medication Sig Dispense Refill   ALPRAZolam  (XANAX ) 0.25 MG tablet Take 0.5 tablets (0.125 mg total) by mouth 2 (two) times daily as needed for anxiety. 30 tablet 0   calcium -vitamin D  (OSCAL WITH D) 500-5 MG-MCG tablet Take 1 tablet by mouth 2 (two) times daily with a meal. 60 tablet 0   cetirizine  (ZYRTEC ) 10 MG tablet TAKE 1 TABLET BY MOUTH EVERY DAY (Patient taking differently: Take 10 mg by mouth daily.) 90 tablet 0   cyclobenzaprine  (FLEXERIL ) 10 MG tablet Take 10 mg by mouth 3 (three) times daily as needed for  muscle spasms.     fluticasone  (FLONASE ) 50 MCG/ACT nasal spray INSTILL 1 SPRAY INTO EACH NOSTRIL DAILY (Patient taking differently: Place 1 spray into both nostrils daily.) 48 mL 0   ibuprofen  (ADVIL ) 600 MG tablet Take 1 tablet (600 mg total) by mouth every 8 (eight) hours as needed for moderate pain (pain score 4-6) or mild pain (pain score 1-3). 60 tablet 1    lidocaine  (LIDODERM ) 5 % Place 1 patch onto the skin daily. Remove & Discard patch within 12 hours or as directed by MD 30 patch 3   [Paused] lisinopril -hydrochlorothiazide  (ZESTORETIC ) 20-25 MG tablet Take 1 tablet by mouth daily. 30 tablet 0   Menthol , Topical Analgesic, (BIOFREEZE) 10 % CREA Apply topically 3 (three) times daily as needed. 85 g 3   methadone  (DOLOPHINE ) 10 MG tablet Take 7 tablets (70 mg) in the morning, 4 tablets (40 mg) in the afternoon and 4 tablets (40 mg) in the evening 225 tablet 0   omeprazole  (PRILOSEC) 20 MG capsule Take 1 capsule (20 mg total) by mouth daily. Must have office visit for refills 90 capsule 0   oxycodone  (ROXICODONE ) 30 MG immediate release tablet Take 1 tablet (30 mg total) by mouth every 4 (four) hours as needed. 90 tablet 0   pregabalin  (LYRICA ) 100 MG capsule Take 1 capsule (100 mg total) by mouth 2 (two) times daily. 60 capsule 1   VOLTAREN  1 % GEL Apply 4 g topically 4 (four) times daily. (Patient not taking: Reported on 06/09/2023) 100 g 0   No current facility-administered medications for this visit.    VITAL SIGNS: There were no vitals taken for this visit. There were no vitals filed for this visit.   Estimated body mass index is 28.61 kg/m as calculated from the following:   Height as of 07/17/23: 5' 1.5 (1.562 m).   Weight as of 08/24/23: 153 lb 14.4 oz (69.8 kg).   PERFORMANCE STATUS (ECOG) : 1 - Symptomatic but completely ambulatory  IMPRESSION: Discussed the use of AI scribe software for clinical note transcription with the patient, who gave verbal consent to proceed.  History of Present Illness Valerie Kerr is a 63 year old female with stage four leiomyosarcoma of the sacrum with bone mets who I connected with by phone for symptom management follow-up. Her appetite is great, and she does not mention any other significant symptoms. Occasional fatigue however tries to remain active. Constipation much improved with daily stool  softeners.   She experiences significant nerve pain that has recently worsened, impacting her ability to walk. Previously, she was able to walk without issues, but now she can only get up to use the bathroom. The pain causes tightness and difficulty turning on her side.  She is currently taking Lyrica  (pregabalin ) 50mg  twice a day, but it is not effective. She is also on methadone , which she takes as prescribed, and oxycodone  as needed. She mentions needing a refill for her oxycodone . Educated patient on neuropathic symptoms. Instructed her to increase pregabalin  to 100mg  twice daily. No adjustments to other pain medications.   All questions answered and support provided.   I discussed the importance of continued conversation with family and their medical providers regarding overall plan of care and treatment options, ensuring decisions are within the context of the patients values and GOCs. Assessment & Plan Stage 4 cancer Stage 4 cancer with nerve pain. Focus on pain control and managing medication side effects. Discussed reducing visit frequency with alternating in-person and telephone  appointments. - Refill oxycodone  prescription. - Prescribe ketorolac , one tablet every eight hours as needed for pain. - Schedule alternating in-person and telephone appointments every four to six weeks.   Nerve pain Chronic nerve pain impairing mobility. Current pregabalin  dose ineffective. - Increase pregabalin  to 100 mg twice daily. - Consider gabapentin  if no improvement. - Send methadone  prescription to pharmacy. - Send oxycodone  prescription to pharmacy.  Constipation due to opioid use Opioid-induced constipation requiring laxatives. - Continue laxatives as needed. Constipation Intermittent constipation with hard stools. Inconsistent use of stool softeners. Advised daily use to improve bowel movements. - Advise taking stool softener daily.  I will plan to see patient back in 4-6 weeks. Sooner if  needed.   Patient expressed understanding and was in agreement with this plan. She also understands that She can call the clinic at any time with any questions, concerns, or complaints.   Any controlled substances utilized were prescribed in the context of palliative care. PDMP has been reviewed.   I personally spent a total of 25 minutes in the care of the patient today including preparing to see the patient, getting/reviewing separately obtained history, performing a medically appropriate exam/evaluation, counseling and educating, referring and communicating with other health care professionals, documenting clinical information in the EHR, and communicating results. Visit consisted of counseling and education dealing with the complex and emotionally intense issues of symptom management and palliative care in the setting of serious and potentially life-threatening illness.  Levon Borer, AGPCNP-BC  Palliative Medicine Team/[facility]

## 2023-10-02 ENCOUNTER — Telehealth: Payer: Self-pay | Admitting: Nurse Practitioner

## 2023-10-02 NOTE — Telephone Encounter (Signed)
 I left a message to inform Susa of her upcoming Palliative appointment. I informed Nuvia to call us  to re-schedule if needed.

## 2023-10-04 ENCOUNTER — Other Ambulatory Visit: Payer: Self-pay

## 2023-10-04 ENCOUNTER — Other Ambulatory Visit (HOSPITAL_COMMUNITY): Payer: Self-pay

## 2023-10-04 MED ORDER — CYCLOBENZAPRINE HCL 10 MG PO TABS
10.0000 mg | ORAL_TABLET | Freq: Three times a day (TID) | ORAL | 0 refills | Status: DC | PRN
  Filled 2023-10-04: qty 45, 15d supply, fill #0

## 2023-10-04 NOTE — Telephone Encounter (Signed)
 Pt called to report increased nerve pain in lack of legs, pt currently taking lyrica  100mg  BID. Per Levon, NP, pt to increase to lyrica  100mg  TID as well as flexeril . F/u call scheduled for 1 week out, no further needs, pt verbalized understanding.

## 2023-10-07 ENCOUNTER — Inpatient Hospital Stay (HOSPITAL_COMMUNITY)
Admission: EM | Admit: 2023-10-07 | Discharge: 2023-10-10 | DRG: 543 | Disposition: A | Discharge status: Hospice | Attending: Internal Medicine | Admitting: Internal Medicine

## 2023-10-07 ENCOUNTER — Emergency Department (HOSPITAL_COMMUNITY)

## 2023-10-07 ENCOUNTER — Other Ambulatory Visit: Payer: Self-pay

## 2023-10-07 DIAGNOSIS — Z809 Family history of malignant neoplasm, unspecified: Secondary | ICD-10-CM

## 2023-10-07 DIAGNOSIS — I10 Essential (primary) hypertension: Secondary | ICD-10-CM | POA: Diagnosis present

## 2023-10-07 DIAGNOSIS — G893 Neoplasm related pain (acute) (chronic): Secondary | ICD-10-CM | POA: Diagnosis not present

## 2023-10-07 DIAGNOSIS — C419 Malignant neoplasm of bone and articular cartilage, unspecified: Secondary | ICD-10-CM

## 2023-10-07 DIAGNOSIS — F17210 Nicotine dependence, cigarettes, uncomplicated: Secondary | ICD-10-CM | POA: Diagnosis present

## 2023-10-07 DIAGNOSIS — C7951 Secondary malignant neoplasm of bone: Principal | ICD-10-CM | POA: Diagnosis present

## 2023-10-07 DIAGNOSIS — Z923 Personal history of irradiation: Secondary | ICD-10-CM

## 2023-10-07 DIAGNOSIS — Z885 Allergy status to narcotic agent status: Secondary | ICD-10-CM

## 2023-10-07 DIAGNOSIS — F419 Anxiety disorder, unspecified: Secondary | ICD-10-CM | POA: Diagnosis present

## 2023-10-07 DIAGNOSIS — Z808 Family history of malignant neoplasm of other organs or systems: Secondary | ICD-10-CM

## 2023-10-07 DIAGNOSIS — Z79890 Hormone replacement therapy (postmenopausal): Secondary | ICD-10-CM

## 2023-10-07 DIAGNOSIS — R1900 Intra-abdominal and pelvic swelling, mass and lump, unspecified site: Secondary | ICD-10-CM | POA: Diagnosis not present

## 2023-10-07 DIAGNOSIS — Z801 Family history of malignant neoplasm of trachea, bronchus and lung: Secondary | ICD-10-CM

## 2023-10-07 DIAGNOSIS — E876 Hypokalemia: Secondary | ICD-10-CM | POA: Diagnosis present

## 2023-10-07 DIAGNOSIS — F418 Other specified anxiety disorders: Secondary | ICD-10-CM

## 2023-10-07 DIAGNOSIS — Z800 Family history of malignant neoplasm of digestive organs: Secondary | ICD-10-CM

## 2023-10-07 DIAGNOSIS — K219 Gastro-esophageal reflux disease without esophagitis: Secondary | ICD-10-CM | POA: Diagnosis present

## 2023-10-07 DIAGNOSIS — Z79899 Other long term (current) drug therapy: Secondary | ICD-10-CM

## 2023-10-07 DIAGNOSIS — K5903 Drug induced constipation: Secondary | ICD-10-CM | POA: Diagnosis present

## 2023-10-07 DIAGNOSIS — C539 Malignant neoplasm of cervix uteri, unspecified: Secondary | ICD-10-CM | POA: Diagnosis present

## 2023-10-07 DIAGNOSIS — Z9221 Personal history of antineoplastic chemotherapy: Secondary | ICD-10-CM

## 2023-10-07 DIAGNOSIS — Z515 Encounter for palliative care: Secondary | ICD-10-CM

## 2023-10-07 DIAGNOSIS — G894 Chronic pain syndrome: Secondary | ICD-10-CM | POA: Diagnosis present

## 2023-10-07 DIAGNOSIS — N1330 Unspecified hydronephrosis: Secondary | ICD-10-CM | POA: Diagnosis present

## 2023-10-07 LAB — CBC
HCT: 36.3 % (ref 36.0–46.0)
Hemoglobin: 11.2 g/dL — ABNORMAL LOW (ref 12.0–15.0)
MCH: 28.1 pg (ref 26.0–34.0)
MCHC: 30.9 g/dL (ref 30.0–36.0)
MCV: 91 fL (ref 80.0–100.0)
Platelets: 362 K/uL (ref 150–400)
RBC: 3.99 MIL/uL (ref 3.87–5.11)
RDW: 15.9 % — ABNORMAL HIGH (ref 11.5–15.5)
WBC: 8.2 K/uL (ref 4.0–10.5)
nRBC: 0 % (ref 0.0–0.2)

## 2023-10-07 LAB — COMPREHENSIVE METABOLIC PANEL WITH GFR
ALT: 12 U/L (ref 0–44)
AST: 13 U/L — ABNORMAL LOW (ref 15–41)
Albumin: 3 g/dL — ABNORMAL LOW (ref 3.5–5.0)
Alkaline Phosphatase: 67 U/L (ref 38–126)
Anion gap: 11 (ref 5–15)
BUN: 13 mg/dL (ref 8–23)
CO2: 28 mmol/L (ref 22–32)
Calcium: 9.1 mg/dL (ref 8.9–10.3)
Chloride: 100 mmol/L (ref 98–111)
Creatinine, Ser: 0.9 mg/dL (ref 0.44–1.00)
GFR, Estimated: >60 mL/min (ref >60)
Glucose, Bld: 77 mg/dL (ref 70–99)
Potassium: 3.3 mmol/L — ABNORMAL LOW (ref 3.5–5.1)
Sodium: 139 mmol/L (ref 135–145)
Total Bilirubin: 0.5 mg/dL (ref 0.0–1.2)
Total Protein: 6.9 g/dL (ref 6.5–8.1)

## 2023-10-07 LAB — MAGNESIUM: Magnesium: 1.9 mg/dL (ref 1.7–2.4)

## 2023-10-07 MED ORDER — LORATADINE 10 MG PO TABS
10.0000 mg | ORAL_TABLET | Freq: Every day | ORAL | Status: DC
  Administered 2023-10-07 – 2023-10-10 (×4): 10 mg via ORAL
  Filled 2023-10-07 (×4): qty 1

## 2023-10-07 MED ORDER — DOCUSATE SODIUM 100 MG PO CAPS
100.0000 mg | ORAL_CAPSULE | Freq: Every day | ORAL | Status: DC
  Administered 2023-10-07 – 2023-10-10 (×4): 100 mg via ORAL
  Filled 2023-10-07 (×4): qty 1

## 2023-10-07 MED ORDER — OYSTER SHELL CALCIUM/D3 500-5 MG-MCG PO TABS
1.0000 | ORAL_TABLET | Freq: Two times a day (BID) | ORAL | Status: DC
  Administered 2023-10-08 – 2023-10-10 (×6): 1 via ORAL
  Filled 2023-10-07 (×7): qty 1

## 2023-10-07 MED ORDER — ONDANSETRON HCL 4 MG/2ML IJ SOLN: 4.0000 mg | Freq: Four times a day (QID) | INTRAMUSCULAR | Status: DC | PRN

## 2023-10-07 MED ORDER — METHADONE HCL 10 MG PO TABS
70.0000 mg | ORAL_TABLET | Freq: Every day | ORAL | Status: DC
  Administered 2023-10-08 – 2023-10-10 (×3): 70 mg via ORAL
  Filled 2023-10-07 (×3): qty 7

## 2023-10-07 MED ORDER — HYDROMORPHONE HCL 1 MG/ML IJ SOLN
1.0000 mg | Freq: Once | INTRAMUSCULAR | Status: AC
  Administered 2023-10-07: 1 mg via INTRAVENOUS
  Filled 2023-10-07: qty 1

## 2023-10-07 MED ORDER — ONDANSETRON HCL 4 MG/2ML IJ SOLN
4.0000 mg | Freq: Once | INTRAMUSCULAR | Status: AC
  Administered 2023-10-07: 4 mg via INTRAVENOUS
  Filled 2023-10-07: qty 2

## 2023-10-07 MED ORDER — PREDNISONE 20 MG PO TABS
40.0000 mg | ORAL_TABLET | Freq: Every day | ORAL | Status: DC
  Administered 2023-10-07: 40 mg via ORAL
  Filled 2023-10-07: qty 2

## 2023-10-07 MED ORDER — PREGABALIN 50 MG PO CAPS
100.0000 mg | ORAL_CAPSULE | Freq: Two times a day (BID) | ORAL | Status: DC
  Administered 2023-10-07 – 2023-10-10 (×6): 100 mg via ORAL
  Filled 2023-10-07 (×6): qty 2

## 2023-10-07 MED ORDER — POTASSIUM CHLORIDE CRYS ER 20 MEQ PO TBCR
20.0000 meq | EXTENDED_RELEASE_TABLET | Freq: Once | ORAL | Status: AC
  Administered 2023-10-07: 20 meq via ORAL
  Filled 2023-10-07: qty 1

## 2023-10-07 MED ORDER — OXYCODONE HCL 5 MG PO TABS
30.0000 mg | ORAL_TABLET | ORAL | Status: DC | PRN
  Administered 2023-10-07 – 2023-10-10 (×6): 30 mg via ORAL
  Filled 2023-10-07 (×6): qty 6

## 2023-10-07 MED ORDER — ENOXAPARIN SODIUM 40 MG/0.4ML IJ SOSY
40.0000 mg | PREFILLED_SYRINGE | INTRAMUSCULAR | Status: DC
  Administered 2023-10-08 – 2023-10-10 (×3): 40 mg via SUBCUTANEOUS
  Filled 2023-10-07 (×3): qty 0.4

## 2023-10-07 MED ORDER — ONDANSETRON HCL 4 MG PO TABS: 4.0000 mg | ORAL_TABLET | Freq: Four times a day (QID) | ORAL | Status: DC | PRN

## 2023-10-07 MED ORDER — LIDOCAINE 5 % EX PTCH
1.0000 | MEDICATED_PATCH | CUTANEOUS | Status: DC
  Administered 2023-10-08 – 2023-10-10 (×2): 1 via TRANSDERMAL
  Filled 2023-10-07 (×2): qty 1

## 2023-10-07 MED ORDER — METHADONE HCL 10 MG PO TABS
40.0000 mg | ORAL_TABLET | ORAL | Status: DC
  Administered 2023-10-07 – 2023-10-10 (×6): 40 mg via ORAL
  Filled 2023-10-07 (×6): qty 4

## 2023-10-07 MED ORDER — PANTOPRAZOLE SODIUM 40 MG PO TBEC
40.0000 mg | DELAYED_RELEASE_TABLET | Freq: Every day | ORAL | Status: DC
  Administered 2023-10-08 – 2023-10-10 (×3): 40 mg via ORAL
  Filled 2023-10-07 (×3): qty 1

## 2023-10-07 MED ORDER — HYDROMORPHONE HCL 1 MG/ML IJ SOLN
0.5000 mg | INTRAMUSCULAR | Status: DC | PRN
  Administered 2023-10-07 – 2023-10-09 (×6): 0.5 mg via INTRAVENOUS
  Filled 2023-10-07: qty 0.5
  Filled 2023-10-07: qty 1
  Filled 2023-10-07 (×4): qty 0.5

## 2023-10-07 MED ORDER — KETOROLAC TROMETHAMINE 15 MG/ML IJ SOLN
15.0000 mg | Freq: Once | INTRAMUSCULAR | Status: AC
  Administered 2023-10-07: 15 mg via INTRAVENOUS
  Filled 2023-10-07: qty 1

## 2023-10-07 MED ORDER — ALPRAZOLAM 0.25 MG PO TABS
0.1250 mg | ORAL_TABLET | Freq: Two times a day (BID) | ORAL | Status: DC | PRN
  Administered 2023-10-09: 0.125 mg via ORAL
  Filled 2023-10-07 (×2): qty 1

## 2023-10-07 MED ORDER — FLUTICASONE PROPIONATE 50 MCG/ACT NA SUSP
1.0000 | Freq: Every day | NASAL | Status: DC
  Administered 2023-10-08 – 2023-10-10 (×3): 1 via NASAL
  Filled 2023-10-07: qty 16

## 2023-10-07 NOTE — Assessment & Plan Note (Signed)
 63 year old with history of stage IV metastatic leiomyosarcoma who presented to ED with acute on chronic leg pain despite pain medications and recent adjustments by palliative care -obs to tele  -CT lumbar spine shows progressing disease: Progressive bony destruction of the S1 and S2 segments of the sacrum with extensive soft tissue tumor in this vicinity extending into the retroperitoneal/presacral space, along the SI joints, along the sacral spinal canal, and adjacent to the piriformis musculature.The tumor and extends cephalad in front of the L5 vertebral body and is thought to extend in the spinal canal on the right side up to the L4 vertebral level. Tumor extends into the SI joints with erosions of the adjacent portions of the iliac bones. -now has Mild right hydronephrosis and hydroureter with the right ureter coursing through the retroperitoneal mass. -she states her back pain is controlled, but acute pain is in her buttocks and down her legs which is likely secondary to progressing disease  -she has completed palliative radiation  -she has no saddle paraesthesias or urinary incontinence. Did discuss MRI, but unsure if this would change management and she is unsure if she wants to do this. I will put order in can discuss in AM.  -Discussed consulting urology to review if candidate for stent and she wants to think about this as well.  -she can not walk at all now and GOC need to be addressed.  -will continue home methadone  at 70mg  AM, 40mg  afternoon and PM and oxycodone  30mg  every 4 hours PRN  -discussed steroids. She is okay with prednisone . Will start 40mg  daily, but not long term solution  -lyrica  just recently increased to 100mg  BID, continue this for now  -requiring multiple doses of dilaudid , will continue judiciously for severe pain  -palliative care consulted for pain management and goals of care.

## 2023-10-07 NOTE — ED Provider Notes (Signed)
 [place] EMERGENCY DEPARTMENT AT [facility] Provider Note   CSN: [id number] Arrival date & time: 10/07/23  1604     Patient presents with: Leg Pain and Numbness   Valerie Kerr is a 63 y.o. female.   63 year old female with past medical history of stage IV leiomyosarcoma with sacral radiation presenting to the emergency department today with worsening back pain.  The patient states that she has been having worsening back pain and pain in her legs now for the past few days.  She has been taking methadone  as well as oxycodone  at home with no improvement.  She is also been taking Lyrica .  She is having issues walking due to the pain now.  She denies any bowel or bladder dysfunction and has not had any saddle anesthesia.  She came to the emergency department today for further evaluation regarding this due to worsening pain.   Leg Pain Associated symptoms: back pain        Prior to Admission medications   Medication Sig Start Date End Date Taking? Authorizing Provider  ALPRAZolam  (XANAX ) 0.25 MG tablet Take 0.5 tablets (0.125 mg total) by mouth 2 (two) times daily as needed for anxiety. 05/31/23   Shannon Agent, MD  calcium -vitamin D  RONNETTE WITH D) 500-5 MG-MCG tablet Take 1 tablet by mouth 2 (two) times daily with a meal. 06/19/23   Vann, Harlene PENNER, DO  cetirizine  (ZYRTEC ) 10 MG tablet TAKE 1 TABLET BY MOUTH EVERY DAY Patient taking differently: Take 10 mg by mouth daily. 04/09/19   Newlin, Enobong, MD  cyclobenzaprine  (FLEXERIL ) 10 MG tablet Take 1 tablet (10 mg total) by mouth 3 (three) times daily as needed for muscle spasms. 10/04/23   Pickenpack-Cousar, Athena N, NP  fluticasone  (FLONASE ) 50 MCG/ACT nasal spray INSTILL 1 SPRAY INTO EACH NOSTRIL DAILY Patient taking differently: Place 1 spray into both nostrils daily. 04/09/19   Newlin, Enobong, MD  ibuprofen  (ADVIL ) 600 MG tablet Take 1 tablet (600 mg total) by mouth every 8 (eight) hours as needed for moderate pain (pain  score 4-6) or mild pain (pain score 1-3). 09/25/23   Pickenpack-Cousar, Athena N, NP  lidocaine  (LIDODERM ) 5 % Place 1 patch onto the skin daily. Remove & Discard patch within 12 hours or as directed by MD 08/07/23   Pickenpack-Cousar, Fannie SAILOR, NP  lisinopril -hydrochlorothiazide  (ZESTORETIC ) 20-25 MG tablet Take 1 tablet by mouth daily. 12/31/18   Newlin, Enobong, MD  Menthol , Topical Analgesic, (BIOFREEZE) 10 % CREA Apply topically 3 (three) times daily as needed. 08/07/23   Pickenpack-Cousar, Fannie SAILOR, NP  methadone  (DOLOPHINE ) 10 MG tablet Take 7 tablets (70 mg) in the morning, 4 tablets (40 mg) in the afternoon and 4 tablets (40 mg) in the evening 09/28/23   Pickenpack-Cousar, Fannie SAILOR, NP  omeprazole  (PRILOSEC) 20 MG capsule Take 1 capsule (20 mg total) by mouth daily. Must have office visit for refills 05/02/19   Delbert Clam, MD  oxycodone  (ROXICODONE ) 30 MG immediate release tablet Take 1 tablet (30 mg total) by mouth every 4 (four) hours as needed. 09/28/23   Pickenpack-Cousar, Athena N, NP  pregabalin  (LYRICA ) 100 MG capsule Take 1 capsule (100 mg total) by mouth 2 (two) times daily. 09/28/23   Pickenpack-Cousar, Fannie SAILOR, NP  VOLTAREN  1 % GEL Apply 4 g topically 4 (four) times daily. Patient not taking: Reported on 06/09/2023 03/31/17   Newlin, Enobong, MD    Allergies: Codeine, Fentanyl , Hydrocodone -acetaminophen , and Tramadol    Review of Systems  Musculoskeletal:  Positive for back pain.       Bilateral lower extremity pain  All other systems reviewed and are negative.   Updated Vital Signs BP (!) 152/81   Pulse 100   Temp (!) 97.3 F (36.3 C) (Oral)   Resp 18   SpO2 95%   Physical Exam Vitals and nursing note reviewed.   Gen: Appears uncomfortable Eyes: PERRL, EOMI HEENT: no oropharyngeal swelling Neck: trachea midline Resp: clear to auscultation bilaterally Card: RRR, no murmurs, rubs, or gallops Abd: nontender, nondistended Extremities: no calf tenderness, no edema Skin:  Tender over the lower lumbar region and sacrum Vascular: 2+ radial pulses bilaterally, 2+ DP pulses bilaterally Neuro: Equal strength and sensation throughout the bilateral lower extremities with normal patellar and Achilles reflexes bilaterally Skin: no rashes Psyc: acting appropriately   (all labs ordered are listed, but only abnormal results are displayed) Labs Reviewed  CBC - Abnormal; Notable for the following components:      Result Value   Hemoglobin 11.2 (*)    RDW 15.9 (*)    All other components within normal limits  COMPREHENSIVE METABOLIC PANEL WITH GFR - Abnormal; Notable for the following components:   Potassium 3.3 (*)    Albumin 3.0 (*)    AST 13 (*)    All other components within normal limits    EKG: None  Radiology: CT Lumbar Spine Wo Contrast Result Date: 10/07/2023 CLINICAL DATA:  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 Multiplanar CT image reconstructions were also generated. RADIATION DOSE REDUCTION: This exam was performed according to the departmental dose-optimization program which includes automated exposure control, adjustment of the mA and/or kV according to patient size and/or use of iterative reconstruction technique. COMPARISON:  CT scan 06/08/2023 FINDINGS: Segmentation: The lowest lumbar type non-rib-bearing vertebra is labeled as L5. Alignment: Stable 3 mm degenerative anterolisthesis at L3-4. Vertebrae: Bony destruction of the S1 and S2 segments of the sacrum with extensive soft tissue tumor in this vicinity extending into the retroperitoneal/presacral space, along the SI joints, along the sacral spinal canal, and adjacent to the piriformis musculature. The tumor right and extends cephalad in front of the L5 vertebral body and is probably extending in the epidural space of  the lower lumbar spine as well. The anterior-posterior dimension of the tumor at the projected the level of the S1 superior endplate is currently 5.3 cm, previously 4.2 cm. Local tumor invasion is progressive in this tumor extends into the L5-S1 neural foraminal. Progressive sclerosis in the L5 vertebra with some indistinctness of portions of the vertebra suggesting early invasion. Again observed is a sagittally oriented fracture of the right side of the L5 vertebral body including the base of the right transverse process and extending into the right pedicle. There complete replacement of the right upper sacral ala by tumor. Erosions along the sacroiliac joints likely from SI joint tumor and involving the iliac bones and sacral ala. Calcification along presumed tumor extending in the right lateral recess and right side of the spinal canal from the mid L4 level down to the sacral mass. Paraspinal and other soft tissues: Atherosclerosis is present, including aortoiliac atherosclerotic disease. Mild right hydronephrosis and hydroureter with the right ureter coursing through the retroperitoneal mass. Consider urology referral for potential stenting. Disc levels: L1-2: Impingement.  Mild disc bulge. L2-3: No overt impingement.  Disc bulge. L3-4: Prominent  central stenosis and moderate bilateral foraminal stenosis due to disc bulge, facet arthropathy, and ligamentum flavum redundancy as well as disc uncovering. L4-5: Prominent right and moderate left foraminal stenosis and moderate central stenosis due to disc bulge, calcified mass in the right lateral recess and right neural foramen, and facet arthropathy. L5-S1: The thecal sac is completely effaced by tumor, as are the neural foramina. IMPRESSION: 1. Progressive bony destruction of the S1 and S2 segments of the sacrum with extensive soft tissue tumor in this vicinity extending into the retroperitoneal/presacral space, along the SI joints, along the sacral spinal canal,  and adjacent to the piriformis musculature. The tumor and extends cephalad in front of the L5 vertebral body and is thought to extend in the spinal canal on the right side up to the L4 vertebral level. Tumor extends into the SI joints with erosions of the adjacent portions of the iliac bones. 2. Progressive sclerosis in the L5 vertebra with some indistinctness of portions of the vertebra suggesting early invasion. 3. Again observed is a sagittally oriented fracture of the right side of the L5 vertebral body including the base of the right transverse process and extending into the right pedicle. 4. Mild right hydronephrosis and hydroureter with the right ureter coursing through the retroperitoneal mass. Consider Urology referral for potential stenting. 5. Lumbar spondylosis and degenerative disc disease, causing impingement at L3-4 and L4-5. 6.  Aortic Atherosclerosis (ICD10-I70.0). Electronically Signed   By: Valerie Kerr M.D.   On: 10/07/2023 18:56     Procedures   Medications Ordered in the ED  HYDROmorphone  (DILAUDID ) injection 1 mg (has no administration in time range)  HYDROmorphone  (DILAUDID ) injection 1 mg (1 mg Intravenous Given 10/07/23 1705)  ondansetron  (ZOFRAN ) injection 4 mg (4 mg Intravenous Given 10/07/23 1705)  HYDROmorphone  (DILAUDID ) injection 1 mg (1 mg Intravenous Given 10/07/23 1750)  ketorolac  (TORADOL ) 15 MG/ML injection 15 mg (15 mg Intravenous Given 10/07/23 1812)                                    Medical Decision Making 63 year old female with past medical history of stage IV leiomyosarcoma presenting to the emergency department today with bilateral lower extremity pain.  I will further evaluate the patient here with basic labs to evaluate for electrolyte abnormalities such as hypercalcemia or paraneoplastic syndrome.  Will obtain a CT scan of her lumbar and sacral region here to evaluate for new lesions.  This may be due to postradiation pain.  Will give the patient  Dilaudid  and Zofran  for symptoms and reevaluate for ultimate disposition.  The patient's CT scan shows worsening malignancy in her sacral region eroding into her SI joints which I suspect is the cause of a lot of her pain.  She is still having a lot of pain on reassessment.  There was some hydronephrosis as well from the lesion.  Patient's creatinine is at baseline.  I will admit the patient for pain control.  Given her already receiving palliative radiation this is likely going to continue to get worse.  Calls placed to hospitalist service for admission.  Amount and/or Complexity of Data Reviewed Labs: ordered. Radiology: ordered.  Risk Prescription drug management. Decision regarding hospitalization.        Final diagnoses:  Cancer associated pain  Pelvic mass    ED Discharge Orders     None          Jordyn Hofacker,  Prentice SAUNDERS, MD 10/07/23 [phone]

## 2023-10-07 NOTE — Assessment & Plan Note (Signed)
 Mild, check magnesium  Replete and follow

## 2023-10-07 NOTE — Assessment & Plan Note (Signed)
 Not compliant with her palliative regimen.  Start back stool softener daily

## 2023-10-07 NOTE — Assessment & Plan Note (Signed)
 Continue PPI.

## 2023-10-07 NOTE — ED Triage Notes (Signed)
 Per EMS from home. C/o bilateral lower extremity and nerve pain, numbness of BLE. Hx of cancer. Takes oxy at home, no effect on pain.  BP  170/90 HR 120 T 97.9

## 2023-10-07 NOTE — H&P (Addendum)
 History and Physical    Patient: Valerie Kerr FMW:[id number] DOB: 1960/08/11 DOA: 10/07/2023 DOS: the patient was seen and examined on 10/07/2023 PCP: Delbert Clam, MD  Patient coming from: Home - lives with her daughter. Unable to walk due to pain    Chief Complaint: leg pain  HPI: Valerie Kerr is a 63 y.o. 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 She states a couple of weeks ago she started to have worsening leg pain that she fills like is more nerve pain. She states its hard to describe. She also has numbness, but this is not new. She feels like her back pain is controlled, but more concerned about the worsening of her leg pain. Pain starts in her buttocks and goes down back of leg. She has lost ability to really walk, but this is not acute. Started a few weeks ago. Denies urinary incontinence or saddle paraesthesias.     Denies any fever/chills, vision changes/headaches, chest pain or palpitations, shortness of breath or cough, abdominal pain, N/V/D, dysuria or leg swelling.    She smokes occasionally and does not drink.   ER Course:  vitals: afebrile, bp: 128/71, HR: 98, RR: 16, oxygen: 95%RA Pertinent labs: potassium: 3.3, hgb: 11.2 CT lumbar spine: MPRESSION: 1. Progressive bony destruction of the S1 and S2 segments of the sacrum with extensive soft tissue tumor in this vicinity extending into the retroperitoneal/presacral space, along the SI joints, along the sacral spinal canal, and adjacent to the piriformis musculature. The tumor and extends cephalad in front of the L5 vertebral body and is thought to extend in the spinal canal on the  right side up to the L4 vertebral level. Tumor extends into the SI joints with erosions of the adjacent portions of the iliac bones. 2. Progressive sclerosis in the L5 vertebra with some indistinctness of portions of the vertebra suggesting early invasion. 3. Again observed is a sagittally oriented fracture of the right side of the L5 vertebral body including the base of the right transverse process and extending into the right pedicle. 4. Mild right hydronephrosis and hydroureter with the right ureter coursing through the retroperitoneal mass. Consider Urology referral for potential stenting. 5. Lumbar spondylosis and degenerative disc disease, causing impingement at L3-4 and L4-5. 6.  Aortic Atherosclerosis (ICD10-I70.0). In ED: given zofran  and dilaudid . TR[facility] asked to admit.    Review of Systems: As mentioned in the history of present illness. All other systems reviewed and are negative. 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---  treatment concurrent chemo (week 6 on hold due to pancytopenia) and radiation therapy (external beam 04-12-2016 to 05-31-2016)  started high-dose rate brachytherapy 05-26-2016   Chemotherapy-induced thrombocytopenia    Chronic pain disorder    back,neck-- chronic methadone    Environmental allergies    allergy to dust mites   GERD (gastroesophageal reflux disease)    Headache    Herniated disc, cervical    History of external beam radiation therapy 04/12/16-05/31/16   pelvis 45 Gy in 25 fractions, in  30 sessions, pelvis boost 9 Gy in 5 fractions   History of radiation therapy 06/20/16-07/04/16  tandem and ring applicator to cervix 28 Gy in 5 fractions   History of radiation therapy    Pelvis(sacrum) 05/18/23-06/16/23-Dr. Lynwood Nasuti   Hypertension    Hypokalemia    Hypomagnesemia    po supplement and IV  replacement   Leukopenia due to antineoplastic chemotherapy (HCC)    Lumbar herniated disc    Pancytopenia due to chemotherapy [facility])    Periodontitis    Past Surgical History:  Procedure Laterality Date   ABCESS DRAINAGE Left 1982   breast   CARPAL TUNNEL RELEASE Bilateral    CERVICAL DISC SURGERY  2010   MULTIPLE EXTRACTIONS WITH ALVEOLOPLASTY N/A 04/08/2016   Procedure: Extraction of tooth #'s 6,0,78,69,68 with alveoloplasty  and gross debridement of remaining teeth;  Surgeon: Tanda JULIANNA Fanny, DDS;  Location: [facility] OR;  Service: Oral Surgery;  Laterality: N/A;   TANDEM RING INSERTION N/A 05/26/2016   Procedure: TANDEM RING INSERTION;  Surgeon: Lynwood Nasuti, MD;  Location[facility];  Service: Urology;  Laterality: N/A;   TANDEM RING INSERTION N/A 06/02/2016   Procedure: TANDEM RING INSERTION;  Surgeon: Lynwood Nasuti, MD;  Location: [facility];  Service: Urology;  Laterality: N/A;   TANDEM RING INSERTION N/A 06/20/2016   Procedure: TANDEM RING INSERTION;  Surgeon: Lynwood Nasuti, MD;  Location: [facility];  Service: Urology;  Laterality: N/A;   TANDEM RING INSERTION N/A 06/23/2016   Procedure: TANDEM RING INSERTION;  Surgeon: Lynwood Nasuti, MD;  Location: [facility];  Service: Urology;  Laterality: N/A;   TANDEM RING INSERTION N/A 07/04/2016   Procedure: TANDEM RING INSERTION;  Surgeon: Lynwood Nasuti, MD;  Location: [facility];  Service: Urology;  Laterality: N/A;   Social History:  reports that she has been smoking cigarettes. She has a 9 pack-year smoking history. She has never used smokeless tobacco. She reports that she does not drink alcohol and does not use drugs.  Allergies  Allergen Reactions   Codeine Itching and Rash   Fentanyl  Rash    Skin rash, local swelling from patch   Hydrocodone -Acetaminophen  Rash   Tramadol Other (See Comments)    GI upset,also trembling sensation    Family History  Problem  Relation Age of Onset   Cancer Father    Cancer Sister        throat   Cancer Brother        throat and stomach   Cancer Brother        lung    Prior to Admission medications   Medication Sig Start Date End Date Taking? Authorizing Provider  ALPRAZolam  (XANAX ) 0.25 MG tablet Take 0.5 tablets (0.125 mg total) by mouth 2 (two) times daily as needed for anxiety. 05/31/23   Nasuti Lynwood, MD  calcium -vitamin D  (OSCAL WITH D) 500-5 MG-MCG tablet Take 1 tablet by mouth 2 (two) times daily with a meal. 06/19/23   Vann, Harlene PENNER, DO  cetirizine  (ZYRTEC ) 10 MG tablet TAKE 1 TABLET BY MOUTH EVERY DAY Patient taking differently: Take 10 mg by mouth daily. 04/09/19   Newlin, Enobong, MD  cyclobenzaprine  (FLEXERIL ) 10 MG tablet Take 1 tablet (10 mg total) by mouth 3 (three) times daily as needed for muscle spasms. 10/04/23   Pickenpack-Cousar, Athena N, NP  fluticasone  (FLONASE ) 50 MCG/ACT nasal spray INSTILL 1 SPRAY INTO EACH NOSTRIL DAILY Patient taking differently: Place 1 spray into both nostrils daily. 04/09/19   Newlin, Enobong, MD  ibuprofen  (ADVIL ) 600 MG tablet Take 1 tablet (  600 mg total) by mouth every 8 (eight) hours as needed for moderate pain (pain score 4-6) or mild pain (pain score 1-3). 09/25/23   Pickenpack-Cousar, Athena N, NP  lidocaine  (LIDODERM ) 5 % Place 1 patch onto the skin daily. Remove & Discard patch within 12 hours or as directed by MD 08/07/23   Pickenpack-Cousar, Fannie SAILOR, NP  lisinopril -hydrochlorothiazide  (ZESTORETIC ) 20-25 MG tablet Take 1 tablet by mouth daily. 12/31/18   Newlin, Enobong, MD  Menthol , Topical Analgesic, (BIOFREEZE) 10 % CREA Apply topically 3 (three) times daily as needed. 08/07/23   Pickenpack-Cousar, Athena N, NP  methadone  (DOLOPHINE ) 10 MG tablet Take 7 tablets (70 mg) in the morning, 4 tablets (40 mg) in the afternoon and 4 tablets (40 mg) in the evening 09/28/23   Pickenpack-Cousar, Fannie SAILOR, NP  omeprazole  (PRILOSEC) 20 MG capsule Take 1 capsule (20 mg total)  by mouth daily. Must have office visit for refills 05/02/19   Delbert Clam, MD  oxycodone  (ROXICODONE ) 30 MG immediate release tablet Take 1 tablet (30 mg total) by mouth every 4 (four) hours as needed. 09/28/23   Pickenpack-Cousar, Athena N, NP  pregabalin  (LYRICA ) 100 MG capsule Take 1 capsule (100 mg total) by mouth 2 (two) times daily. 09/28/23   Pickenpack-Cousar, Fannie SAILOR, NP  VOLTAREN  1 % GEL Apply 4 g topically 4 (four) times daily. Patient not taking: Reported on 06/09/2023 03/31/17   Delbert Clam, MD    Physical Exam: Vitals:   10/07/23 1630 10/07/23 1753 10/07/23 2128  BP: 128/71 (!) 152/81   Pulse: 98 100   Resp: 16 18   Temp: (!) 97.3 F (36.3 C)  97.8 F (36.6 C)  TempSrc: Oral  Oral  SpO2: 95% 95%    General:  Appears calm and comfortable and is in NAD Eyes:  PERRL, EOMI, normal lids, iris ENT:  grossly normal hearing, lips & tongue, mmm; appropriate dentition Neck:  no LAD, masses or thyromegaly; no carotid bruits Cardiovascular:  RRR, no m/r/g. No LE edema.  Respiratory:   CTA bilaterally with no wheezes/rales/rhonchi.  Normal respiratory effort. Abdomen:  soft, NT, ND, NABS Back:   normal alignment, no CVAT Skin:  no rash or induration seen on limited exam Musculoskeletal:  grossly normal tone BUE. BLE 1/5. Can lift off bed against gravity, but pain stops.  Lower extremity:  No LE edema.  Limited foot exam with no ulcerations.  2+ distal pulses. Psychiatric:  grossly normal mood and affect, speech fluent and appropriate, AOx3 Neurologic:  CN 2-12 grossly intact, moves all extremities in coordinated fashion, sensation intact.    Radiological Exams on Admission: Independently reviewed - see discussion in A/P where applicable  CT Lumbar Spine Wo Contrast Result Date: 10/07/2023 CLINICAL DATA:  Poorly differentiated squamous cell carcinoma of the cervix, sacral mass EXAM: CT LUMBAR SPINE WITHOUT CONTRAST TECHNIQUE: Multidetector CT imaging of the lumbar spine was  performed without intravenous contrast administration. Multiplanar CT image reconstructions were also generated. RADIATION DOSE REDUCTION: This exam was performed according to the departmental dose-optimization program which includes automated exposure control, adjustment of the mA and/or kV according to patient size and/or use of iterative reconstruction technique. COMPARISON:  CT scan 06/08/2023 FINDINGS: Segmentation: The lowest lumbar type non-rib-bearing vertebra is labeled as L5. Alignment: Stable 3 mm degenerative anterolisthesis at L3-4. Vertebrae: Bony destruction of the S1 and S2 segments of the sacrum with extensive soft tissue tumor in this vicinity extending into the retroperitoneal/presacral space, along the SI joints, along the sacral spinal  canal, and adjacent to the piriformis musculature. The tumor right and extends cephalad in front of the L5 vertebral body and is probably extending in the epidural space of the lower lumbar spine as well. The anterior-posterior dimension of the tumor at the projected the level of the S1 superior endplate is currently 5.3 cm, previously 4.2 cm. Local tumor invasion is progressive in this tumor extends into the L5-S1 neural foraminal. Progressive sclerosis in the L5 vertebra with some indistinctness of portions of the vertebra suggesting early invasion. Again observed is a sagittally oriented fracture of the right side of the L5 vertebral body including the base of the right transverse process and extending into the right pedicle. There complete replacement of the right upper sacral ala by tumor. Erosions along the sacroiliac joints likely from SI joint tumor and involving the iliac bones and sacral ala. Calcification along presumed tumor extending in the right lateral recess and right side of the spinal canal from the mid L4 level down to the sacral mass. Paraspinal and other soft tissues: Atherosclerosis is present, including aortoiliac atherosclerotic disease.  Mild right hydronephrosis and hydroureter with the right ureter coursing through the retroperitoneal mass. Consider urology referral for potential stenting. Disc levels: L1-2: Impingement.  Mild disc bulge. L2-3: No overt impingement.  Disc bulge. L3-4: Prominent central stenosis and moderate bilateral foraminal stenosis due to disc bulge, facet arthropathy, and ligamentum flavum redundancy as well as disc uncovering. L4-5: Prominent right and moderate left foraminal stenosis and moderate central stenosis due to disc bulge, calcified mass in the right lateral recess and right neural foramen, and facet arthropathy. L5-S1: The thecal sac is completely effaced by tumor, as are the neural foramina. IMPRESSION: 1. Progressive bony destruction of the S1 and S2 segments of the sacrum with extensive soft tissue tumor in this vicinity extending into the retroperitoneal/presacral space, along the SI joints, along the sacral spinal canal, and adjacent to the piriformis musculature. The tumor and extends cephalad in front of the L5 vertebral body and is thought to extend in the spinal canal on the right side up to the L4 vertebral level. Tumor extends into the SI joints with erosions of the adjacent portions of the iliac bones. 2. Progressive sclerosis in the L5 vertebra with some indistinctness of portions of the vertebra suggesting early invasion. 3. Again observed is a sagittally oriented fracture of the right side of the L5 vertebral body including the base of the right transverse process and extending into the right pedicle. 4. Mild right hydronephrosis and hydroureter with the right ureter coursing through the retroperitoneal mass. Consider Urology referral for potential stenting. 5. Lumbar spondylosis and degenerative disc disease, causing impingement at L3-4 and L4-5. 6.  Aortic Atherosclerosis (ICD10-I70.0). Electronically Signed   By: Ryan Salvage M.D.   On: 10/07/2023 18:56    Labs on Admission: I have  personally reviewed the available labs and imaging studies at the time of the admission.  Pertinent labs:   potassium: 3.3,  hgb: 11.2  Assessment and Plan: Principal Problem:   Cancer-related breakthrough pain from Metastatic leiomyosarcoma of the bone /Chronic pain syndrome Active Problems:   Anxiety   Hypokalemia   Essential hypertension   GERD (gastroesophageal reflux disease)   Drug-induced constipation    Assessment and Plan: * Cancer-related breakthrough pain from Metastatic leiomyosarcoma of the bone /Chronic pain syndrome 63 year old with history of stage IV metastatic leiomyosarcoma who presented to ED with acute on chronic leg pain despite pain medications and  recent adjustments by palliative care -obs to tele  -CT lumbar spine shows progressing disease: Progressive bony destruction of the S1 and S2 segments of the sacrum with extensive soft tissue tumor in this vicinity extending into the retroperitoneal/presacral space, along the SI joints, along the sacral spinal canal, and adjacent to the piriformis musculature.The tumor and extends cephalad in front of the L5 vertebral body and is thought to extend in the spinal canal on the right side up to the L4 vertebral level. Tumor extends into the SI joints with erosions of the adjacent portions of the iliac bones. -now has Mild right hydronephrosis and hydroureter with the right ureter coursing through the retroperitoneal mass. -she states her back pain is controlled, but acute pain is in her buttocks and down her legs which is likely secondary to progressing disease  -she has completed palliative radiation  -she has no saddle paraesthesias or urinary incontinence. Did discuss MRI, but unsure if this would change management and she is unsure if she wants to do this. I will put order in can discuss in AM.  -Discussed consulting urology to review if candidate for stent and she wants to think about this as well.  -she can not walk at  all now and GOC need to be addressed.  -will continue home methadone  at 70mg  AM, 40mg  afternoon and PM and oxycodone  30mg  every 4 hours PRN  -discussed steroids. She is okay with prednisone . Will start 40mg  daily, but not long term solution  -lyrica  just recently increased to 100mg  BID, continue this for now  -requiring multiple doses of dilaudid , will continue judiciously for severe pain  -palliative care consulted for pain management and goals of care.   Anxiety -Continue Xanax  as needed.   Hypokalemia Mild, check magnesium  Replete and follow   Essential hypertension Off medication, continue to monitor   GERD (gastroesophageal reflux disease) Continue PPI   Drug-induced constipation Not compliant with her palliative regimen.  Start back stool softener daily     Advance Care Planning:   Code Status: Full Code   Consults: palliative care   DVT Prophylaxis: lovenox   Family Communication: none  Severity of Illness: The appropriate patient status for this patient is OBSERVATION. Observation status is judged to be reasonable and necessary in order to provide the required intensity of service to ensure the patient's safety. The patient's presenting symptoms, physical exam findings, and initial radiographic and laboratory data in the context of their medical condition is felt to place them at decreased risk for further clinical deterioration. Furthermore, it is anticipated that the patient will be medically stable for discharge from the hospital within 2 midnights of admission.   Author: Isaiah Geralds, MD 10/07/2023 9:33 PM  For on call review [email or web address].

## 2023-10-07 NOTE — Assessment & Plan Note (Signed)
Continue Xanax as needed 

## 2023-10-07 NOTE — Assessment & Plan Note (Signed)
Off medication, continue to monitor

## 2023-10-08 ENCOUNTER — Encounter (HOSPITAL_COMMUNITY): Payer: Self-pay | Admitting: Family Medicine

## 2023-10-08 DIAGNOSIS — G893 Neoplasm related pain (acute) (chronic): Secondary | ICD-10-CM | POA: Diagnosis not present

## 2023-10-08 LAB — BASIC METABOLIC PANEL WITH GFR
Anion gap: 10 (ref 5–15)
BUN: 17 mg/dL (ref 8–23)
CO2: 26 mmol/L (ref 22–32)
Calcium: 9.2 mg/dL (ref 8.9–10.3)
Chloride: 102 mmol/L (ref 98–111)
Creatinine, Ser: 0.94 mg/dL (ref 0.44–1.00)
GFR, Estimated: >60 mL/min (ref >60)
Glucose, Bld: 193 mg/dL — ABNORMAL HIGH (ref 70–99)
Potassium: 4.9 mmol/L (ref 3.5–5.1)
Sodium: 138 mmol/L (ref 135–145)

## 2023-10-08 MED ORDER — CARISOPRODOL 350 MG PO TABS: 350.0000 mg | ORAL_TABLET | Freq: Three times a day (TID) | ORAL | Status: DC | PRN

## 2023-10-08 MED ORDER — SENNOSIDES-DOCUSATE SODIUM 8.6-50 MG PO TABS
2.0000 | ORAL_TABLET | Freq: Every day | ORAL | Status: DC
  Administered 2023-10-08 – 2023-10-09 (×2): 2 via ORAL
  Filled 2023-10-08 (×2): qty 2

## 2023-10-08 MED ORDER — DEXAMETHASONE 4 MG PO TABS
4.0000 mg | ORAL_TABLET | Freq: Three times a day (TID) | ORAL | Status: AC
  Administered 2023-10-08 – 2023-10-10 (×6): 4 mg via ORAL
  Filled 2023-10-08 (×6): qty 1

## 2023-10-08 NOTE — Progress Notes (Signed)
 PROGRESS NOTE    Valerie Kerr  FMW:[id number] DOB: 1960/07/21 DOA: 10/07/2023 PCP: Delbert Clam, MD   Brief Narrative:  63 y.o. 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 She states a couple of weeks ago she started to have worsening leg pain that she fills like is more nerve pain. She states its hard to describe. She also has numbness, but this is not new. She feels like her back pain is controlled, but more concerned about the worsening of her leg pain. Pain starts in her buttocks and goes down back of leg. She has lost ability to really walk, but this is not acute. Started a few weeks ago. Denies urinary incontinence or saddle paraesthesias.   CT lumbar spine: Progressive bony destruction of the S1 and S2 segments of the sacrum with extensive soft tissue tumor in this vicinity extending into the retroperitoneal/presacral space, along the SI joints, along the sacral spinal canal, and adjacent to the piriformis musculature. The tumor and extends cephalad in front of the L5 vertebral body and is thought to extend in the spinal canal on the right side up to the L4 vertebral level. Tumor extends into the SI joints with erosions of the adjacent portions of the iliac bones.Progressive sclerosis in the L5 vertebra with some indistinctness of portions of the vertebra suggesting early invasion.Again observed is a sagittally oriented fracture of the right side of the L5 vertebral body including the base of the right transverse process and extending into the right pedicle.Mild right hydronephrosis and hydroureter with the right ureter coursing through the  retroperitoneal mass. Consider Urology referral for potential stenting.Lumbar spondylosis and degenerative disc disease, causing impingement at L3-4 and L4-5.  Assessment & Plan:   Principal Problem:   Cancer-related breakthrough pain from Metastatic leiomyosarcoma of the bone /Chronic pain syndrome Active Problems:   Anxiety   Hypokalemia   Essential hypertension   GERD (gastroesophageal reflux disease)   Drug-induced constipation   * Cancer-related breakthrough pain from Metastatic leiomyosarcoma of the bone /Chronic pain syndrome 63 year old with history of stage IV metastatic leiomyosarcoma who presented to ED with acute on chronic leg pain despite pain medications and recent adjustments by palliative care -obs to tele  -CT lumbar spine shows progressing disease: Progressive bony destruction of the S1 and S2 segments of the sacrum with extensive soft tissue tumor in this vicinity extending into the retroperitoneal/presacral space, along the SI joints, along the sacral spinal canal, and adjacent to the piriformis musculature.The tumor and extends cephalad in front of the L5 vertebral body and is thought to extend in the spinal canal on the right side up to the L4 vertebral level. Tumor extends into the SI joints with erosions of the adjacent portions of the iliac bones. -now has Mild right hydronephrosis and hydroureter with the right ureter coursing through the retroperitoneal mass. -she states her back pain is controlled, but acute pain is in her buttocks and down her legs which is likely secondary to progressing disease  -she has completed palliative radiation  -she has no saddle paraesthesias or urinary incontinence. She is still not sure if she wants to have an MRI done or needs just wants to see a urologist.  Is not able to walk now, the last  time she walked was few weeks ago.  -will continue home methadone  at 70mg  AM, 40mg  afternoon and PM and oxycodone  30mg  every 4 hours PRN   -lyrica  just recently increased to 100mg  BID, continue this for now  -requiring multiple doses of dilaudid , will continue judiciously for severe pain  -palliative care consulted for pain management and goals of care.   -consult PT/OT Decadron  for pain relief Check EKG   Anxiety -Continue Xanax  as needed.    Hypokalemia LABS PENDING   Essential hypertension stable off med   GERD (gastroesophageal reflux disease) Continue PPI    Drug-induced constipation Senna  Colace miralax     Estimated body mass index is 29.66 kg/m as calculated from the following:   Height as of this encounter: 5' 1 (1.549 m).   Weight as of this encounter: 71.2 kg.  DVT prophylaxis:lovenox  Code Status:full Family Communication:none Disposition Plan:  Status is: Observation   Consultants: palliative  Procedures:none Antimicrobials: none  Subjective:  Resting in bed  Has not gotten out of bed   Objective: Vitals:   10/07/23 2257 10/07/23 2258 10/08/23 0259 10/08/23 0631  BP: 125/77  130/75 121/76  Pulse: (!) 104  90 88  Resp: 20  14 14   Temp: 97.6 F (36.4 C)  97.9 F (36.6 C) (!) 97.5 F (36.4 C)  TempSrc: Oral  Oral Oral  SpO2: 94%  95% 92%  Weight:  71.2 kg    Height:  5' 1 (1.549 m)     No intake or output data in the 24 hours ending 10/08/23 0854 Filed Weights   10/07/23 2258  Weight: 71.2 kg    Examination:  General exam: Appears in nad  Respiratory system: Clear to auscultation. Respiratory effort normal. Cardiovascular system: reg Gastrointestinal system: Abdomen is nondistended, soft and nontender. No organomegaly or masses felt. Normal bowel sounds heard. Central nervous system: Alert and oriented.  Extremities: no edema   Data Reviewed: I have personally reviewed following labs and imaging studies  CBC: Recent Labs  Lab 10/07/23 1654  WBC 8.2  HGB 11.2*  HCT 36.3  MCV 91.0  PLT 362   Basic Metabolic Panel: Recent Labs  Lab 10/07/23 1654  10/07/23 2310  NA 139  --   K 3.3*  --   CL 100  --   CO2 28  --   GLUCOSE 77  --   BUN 13  --   CREATININE 0.90  --   CALCIUM  9.1  --   MG  --  1.9   GFR: Estimated Creatinine Clearance: 57 mL/min (by C-G formula based on SCr of 0.9 mg/dL). Liver Function Tests: Recent Labs  Lab 10/07/23 1654  AST 13*  ALT 12  ALKPHOS 67  BILITOT 0.5  PROT 6.9  ALBUMIN 3.0*   No results for input(s): LIPASE, AMYLASE in the last 168 hours. No results for input(s): AMMONIA in the last 168 hours. Coagulation Profile: No results for input(s): INR, PROTIME in the last 168 hours. Cardiac Enzymes: No results for input(s): CKTOTAL, CKMB, CKMBINDEX, TROPONINI in the last 168 hours. BNP (last 3 results) No results for input(s): PROBNP in the last 8760 hours. HbA1C: No results for input(s): HGBA1C in the last 72 hours. CBG: No results for input(s): GLUCAP in the last 168 hours. Lipid Profile: No results for input(s): CHOL, HDL, LDLCALC, TRIG, CHOLHDL, LDLDIRECT in the last 72 hours. Thyroid Function Tests: No results for input(s): TSH, T4TOTAL, FREET4, T3FREE, THYROIDAB in the last 72 hours. Anemia Panel: No  results for input(s): VITAMINB12, FOLATE, FERRITIN, TIBC, IRON, RETICCTPCT in the last 72 hours. Sepsis Labs: No results for input(s): PROCALCITON, LATICACIDVEN in the last 168 hours.  No results found for this or any previous visit (from the past 240 hours).       Radiology Studies: CT Lumbar Spine Wo Contrast Result Date: 10/07/2023 CLINICAL DATA:  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 Multiplanar CT image reconstructions were also generated. RADIATION DOSE REDUCTION: This exam was performed according to the departmental  dose-optimization program which includes automated exposure control, adjustment of the mA and/or kV according to patient size and/or use of iterative reconstruction technique. COMPARISON:  CT scan 06/08/2023 FINDINGS: Segmentation: The lowest lumbar type non-rib-bearing vertebra is labeled as L5. Alignment: Stable 3 mm degenerative anterolisthesis at L3-4. Vertebrae: Bony destruction of the S1 and S2 segments of the sacrum with extensive soft tissue tumor in this vicinity extending into the retroperitoneal/presacral space, along the SI joints, along the sacral spinal canal, and adjacent to the piriformis musculature. The tumor right and extends cephalad in front of the L5 vertebral body and is probably extending in the epidural space of the lower lumbar spine as well. The anterior-posterior dimension of the tumor at the projected the level of the S1 superior endplate is currently 5.3 cm, previously 4.2 cm. Local tumor invasion is progressive in this tumor extends into the L5-S1 neural foraminal. Progressive sclerosis in the L5 vertebra with some indistinctness of portions of the vertebra suggesting early invasion. Again observed is a sagittally oriented fracture of the right side of the L5 vertebral body including the base of the right transverse process and extending into the right pedicle. There complete replacement of the right upper sacral ala by tumor. Erosions along the sacroiliac joints likely from SI joint tumor and involving the iliac bones and sacral ala. Calcification along presumed tumor extending in the right lateral recess and right side of the spinal canal from the mid L4 level down to the sacral mass. Paraspinal and other soft tissues: Atherosclerosis is present, including aortoiliac atherosclerotic disease. Mild right hydronephrosis and hydroureter with the right ureter coursing through the retroperitoneal mass. Consider urology referral for potential stenting. Disc levels: L1-2: Impingement.  Mild  disc bulge. L2-3: No overt impingement.  Disc bulge. L3-4: Prominent central stenosis and moderate bilateral foraminal stenosis due to disc bulge, facet arthropathy, and ligamentum flavum redundancy as well as disc uncovering. L4-5: Prominent right and moderate left foraminal stenosis and moderate central stenosis due to disc bulge, calcified mass in the right lateral recess and right neural foramen, and facet arthropathy. L5-S1: The thecal sac is completely effaced by tumor, as are the neural foramina. IMPRESSION: 1. Progressive bony destruction of the S1 and S2 segments of the sacrum with extensive soft tissue tumor in this vicinity extending into the retroperitoneal/presacral space, along the SI joints, along the sacral spinal canal, and adjacent to the piriformis musculature. The tumor and extends cephalad in front of the L5 vertebral body and is thought to extend in the spinal canal on the right side up to the L4 vertebral level. Tumor extends into the SI joints with erosions of the adjacent portions of the iliac bones. 2. Progressive sclerosis in the L5 vertebra with some indistinctness of portions of the vertebra suggesting early invasion. 3. Again observed is a sagittally oriented fracture of the right  side of the L5 vertebral body including the base of the right transverse process and extending into the right pedicle. 4. Mild right hydronephrosis and hydroureter with the right ureter coursing through the retroperitoneal mass. Consider Urology referral for potential stenting. 5. Lumbar spondylosis and degenerative disc disease, causing impingement at L3-4 and L4-5. 6.  Aortic Atherosclerosis (ICD10-I70.0). Electronically Signed   By: Ryan Salvage M.D.   On: 10/07/2023 18:56    Scheduled Meds:  calcium -vitamin D   1 tablet Oral BID WC   docusate sodium   100 mg Oral Daily   enoxaparin  (LOVENOX ) injection  40 mg Subcutaneous Q24H   fluticasone   1 spray Each Nare Daily   lidocaine   1 patch  Transdermal Q24H   loratadine   10 mg Oral Daily   methadone   40 mg Oral 2 times per day   methadone   70 mg Oral QAC breakfast   pantoprazole   40 mg Oral Daily   predniSONE   40 mg Oral Daily   pregabalin   100 mg Oral BID   Continuous Infusions:   LOS: 0 days   Valerie KANDICE Hoots, MD  10/08/2023, 8:54 AM

## 2023-10-08 NOTE — Consult Note (Signed)
 Consultation Note Date: 10/08/2023   Patient Name: Valerie Kerr  DOB: 08-28-1960  MRN: [id number]  Age / Sex: 63 y.o., female  PCP: Delbert Clam, MD Referring Physician: Will Almarie MATSU, MD  Reason for Consultation: Establishing goals of care, Non pain symptom management, and Pain control  HPI/Patient Profile: 63 y.o. 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   Admitted on 10/07/2023.   Clinical Assessment and Goals of Care: Patient follows with palliative care at the cancer center.  Patient arrived to the hospital complaining of worsening leg pain that has neuropathic features.  She has not been able to walk.  She denies urinary incontinence or saddle anesthesia Patient has been admitted to hospital medicine service for cancer related breakthrough pain from metastatic leiomyosarcoma of the bone as well as chronic pain syndrome.  Patient has methadone  and follows with palliative care at the cancer center.  She has been started on steroids.  Her Lyrica  was recently increased.  She is also being given IV hydromorphone  for severe pain. Palliative consult continues Patient underwent CT scan of the lumbar spine showing progressive disease progressive bony destruction of S1 and S2 segments also with mild right-sided hydronephrosis and hydroureter  NEXT OF KIN Daughter  SUMMARY OF RECOMMENDATIONS   Pain management as well as goals of care discussions undertaken.  Discussed with patient about her current condition.  Discussed with her about pain regimen.  Chart reviewed.  Continue to monitor opioid needs.  Patient is on p.o. methadone .  Recommend follow-up EKG for monitoring QTc  interval. Anxiety-continue Xanax  as needed Continue current pain and non-- pain symptom management options.  Palliative care to continue to follow along for symptom control. Thank you for the consult  Code Status/Advance Care Planning: Full code   Symptom Management:     Palliative Prophylaxis:  Delirium Protocol  Additional Recommendations (Limitations, Scope, Preferences): Full Scope Treatment  Psycho-social/Spiritual:  Desire for further Chaplaincy support:yes Additional Recommendations: Caregiving  Support/Resources  Prognosis:  Unable to determine  Discharge Planning: To Be Determined      Primary Diagnoses: Present on Admission:  Anxiety  Cancer-related breakthrough pain from Metastatic leiomyosarcoma of the bone /Chronic pain syndrome  Essential hypertension  Drug-induced constipation  GERD (gastroesophageal reflux disease)  Hypokalemia   I have reviewed the medical record, interviewed the patient and family, and examined the patient. The following aspects are pertinent.  Past Medical History:  Diagnosis Date   Anxiety    Cervical cancer [facility]) oncologist-  dr gorsuch/ dr shannon   02-20-2016 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---  treatment concurrent chemo (week 6 on hold due to pancytopenia) and radiation therapy (external beam 04-12-2016 to 05-31-2016)  started high-dose rate brachytherapy 05-26-2016   Chemotherapy-induced thrombocytopenia    Chronic pain disorder    back,neck-- chronic methadone    Environmental allergies  allergy to dust mites   GERD (gastroesophageal reflux disease)    Headache    Herniated disc, cervical    History of external beam radiation therapy 04/12/16-05/31/16   pelvis 45 Gy in 25 fractions, in  30 sessions, pelvis boost 9 Gy in 5 fractions   History of radiation therapy 06/20/16-07/04/16   tandem and ring applicator to cervix 28 Gy in 5 fractions   History of radiation therapy    Pelvis(sacrum)  05/18/23-06/16/23-Dr. Lynwood Nasuti   Hypertension    Hypokalemia    Hypomagnesemia    po supplement and IV replacement   Leukopenia due to antineoplastic chemotherapy (HCC)    Lumbar herniated disc    Pancytopenia due to chemotherapy (HCC)    Periodontitis    Social History   Socioeconomic History   Marital status: Divorced    Spouse name: Not on file   Number of children: 5   Years of education: Not on file   Highest education level: Not on file  Occupational History   Occupation: disabled  Tobacco Use   Smoking status: Some Days    Current packs/day: 0.20    Average packs/day: 0.2 packs/day for 45.0 years (9.0 ttl pk-yrs)    Types: Cigarettes   Smokeless tobacco: Never   Tobacco comments:    1 week since last cigarette  Vaping Use   Vaping status: Never Used  Substance and Sexual Activity   Alcohol use: No   Drug use: No   Sexual activity: Not Currently  Other Topics Concern   Not on file  Social History Narrative   Lives with niece in a 2 story home.  Has 5 children.  On disability.  Formerly worked in Stage manager at [employer].  Education: 11th grade.    Social Drivers of Corporate investment banker Strain: Not on file  Food Insecurity: No Food Insecurity (10/07/2023)   Hunger Vital Sign    Worried About Running Out of Food in the Last Year: Never true    Ran Out of Food in the Last Year: Never true  Transportation Needs: No Transportation Needs (10/07/2023)   PRAPARE - Administrator, Civil Service (Medical): No    Lack of Transportation (Non-Medical): No  Physical Activity: Not on file  Stress: Not on file  Social Connections: Feeling Socially Integrated (04/19/2023)   [facility]   OASIS D0700: Social Isolation    Frequency of experiencing loneliness or isolation: Never   Family History  Problem Relation Age of Onset   Cancer Father    Cancer Sister        throat   Cancer Brother        throat and stomach    Cancer Brother        lung   Scheduled Meds:  calcium -vitamin D   1 tablet Oral BID WC   dexamethasone   4 mg Oral Q8H   docusate sodium   100 mg Oral Daily   enoxaparin  (LOVENOX ) injection  40 mg Subcutaneous Q24H   fluticasone   1 spray Each Nare Daily   lidocaine   1 patch Transdermal Q24H   loratadine   10 mg Oral Daily   methadone   40 mg Oral 2 times per day   methadone   70 mg Oral QAC breakfast   pantoprazole   40 mg Oral Daily   pregabalin   100 mg Oral BID   senna-docusate  2 tablet Oral QHS   Continuous Infusions: PRN Meds:.ALPRAZolam , carisoprodol , HYDROmorphone  (DILAUDID ) injection, ondansetron  **OR**  ondansetron  (ZOFRAN ) IV, oxycodone  Medications Prior to Admission:  Prior to Admission medications   Medication Sig Start Date End Date Taking? Authorizing Provider  ALPRAZolam  (XANAX ) 0.25 MG tablet Take 0.5 tablets (0.125 mg total) by mouth 2 (two) times daily as needed for anxiety. 05/31/23  Yes Shannon Agent, MD  calcium -vitamin D  (OSCAL WITH D) 500-5 MG-MCG tablet Take 1 tablet by mouth 2 (two) times daily with a meal. 06/19/23  Yes Vann, Jessica U, DO  cetirizine  (ZYRTEC ) 10 MG tablet TAKE 1 TABLET BY MOUTH EVERY DAY Patient taking differently: Take 10 mg by mouth daily. 04/09/19  Yes Newlin, Enobong, MD  cyclobenzaprine  (FLEXERIL ) 10 MG tablet Take 1 tablet (10 mg total) by mouth 3 (three) times daily as needed for muscle spasms. 10/04/23  Yes Pickenpack-Cousar, Fannie SAILOR, NP  fluticasone  (FLONASE ) 50 MCG/ACT nasal spray INSTILL 1 SPRAY INTO EACH NOSTRIL DAILY Patient taking differently: Place 1 spray into both nostrils daily. 04/09/19  Yes Newlin, Enobong, MD  ibuprofen  (ADVIL ) 600 MG tablet Take 1 tablet (600 mg total) by mouth every 8 (eight) hours as needed for moderate pain (pain score 4-6) or mild pain (pain score 1-3). 09/25/23  Yes Pickenpack-Cousar, Fannie SAILOR, NP  lidocaine  (LIDODERM ) 5 % Place 1 patch onto the skin daily. Remove & Discard patch within 12 hours or as directed by  MD Patient taking differently: Place 1 patch onto the skin daily as needed (for pain). 08/07/23  Yes Pickenpack-Cousar, Athena N, NP  lisinopril -hydrochlorothiazide  (ZESTORETIC ) 20-25 MG tablet Take 1 tablet by mouth daily. 12/31/18  Yes Newlin, Enobong, MD  Menthol , Topical Analgesic, (BIOFREEZE) 10 % CREA Apply topically 3 (three) times daily as needed. Patient taking differently: Apply 1 Application topically 3 (three) times daily as needed (for pain). 08/07/23  Yes Pickenpack-Cousar, Fannie SAILOR, NP  methadone  (DOLOPHINE ) 10 MG tablet Take 7 tablets (70 mg) in the morning, 4 tablets (40 mg) in the afternoon and 4 tablets (40 mg) in the evening Patient taking differently: Take 10 mg by mouth See admin instructions. Take 7 tablets by mouth  (70 mg) in the morning, 4 tablets by mouth  (40 mg) in the afternoon and then take  4 tablets by mouth  (40 mg) in the evening per patient 09/28/23  Yes Pickenpack-Cousar, Fannie SAILOR, NP  omeprazole  (PRILOSEC) 20 MG capsule Take 1 capsule (20 mg total) by mouth daily. Must have office visit for refills 05/02/19  Yes Newlin, Enobong, MD  oxycodone  (ROXICODONE ) 30 MG immediate release tablet Take 1 tablet (30 mg total) by mouth every 4 (four) hours as needed. Patient taking differently: Take 30 mg by mouth every 4 (four) hours as needed for pain. 09/28/23  Yes Pickenpack-Cousar, Fannie SAILOR, NP  pregabalin  (LYRICA ) 100 MG capsule Take 1 capsule (100 mg total) by mouth 2 (two) times daily. 09/28/23  Yes Pickenpack-Cousar, Fannie SAILOR, NP   Allergies  Allergen Reactions   Codeine Itching and Rash   Fentanyl  Rash    Skin rash, local swelling from patch   Hydrocodone -Acetaminophen  Rash   Tramadol Other (See Comments)    GI upset,also trembling sensation   Review of Systems +weakness +pain  Physical Exam Awake alert resting in bed Appears with generalized weakness Regular work of breathing  Vital Signs: BP 127/73 (BP Location: Left Arm)   Pulse 79   Temp 98.3 F (36.8 C)  (Oral)   Resp 17   Ht 5' 1 (1.549 m)   Wt 71.2 kg   SpO2 93%   BMI  29.66 kg/m  Pain Scale: 0-10   Pain Score: 0-No pain   SpO2: SpO2: 93 % O2 Device:SpO2: 93 % O2 Flow Rate: .   IO: Intake/output summary: No intake or output data in the 24 hours ending 10/08/23 1413  LBM: Last BM Date : 10/06/23 Baseline Weight: Weight: 71.2 kg Most recent weight: Weight: 71.2 kg     Palliative Assessment/Data:   PPS 40%  Time In:  1300 Time Out:  1400 Time Total:  60  Greater than 50%  of this time was spent counseling and coordinating care related to the above assessment and plan.  Signed by: Lonia Serve, MD   Please contact Palliative Medicine Team phone at [phone] for questions and concerns.  For individual provider: See Tracey

## 2023-10-08 NOTE — Care Management Obs Status (Signed)
 MEDICARE OBSERVATION STATUS NOTIFICATION   Patient Details  Name: Valerie Kerr MRN: [id number] Date of Birth: 12/17/1960   Medicare Observation Status Notification Given:  Yes    Delenn Ahn A [place], LCSW 10/08/2023, 3:52 PM

## 2023-10-09 DIAGNOSIS — Z808 Family history of malignant neoplasm of other organs or systems: Secondary | ICD-10-CM | POA: Diagnosis not present

## 2023-10-09 DIAGNOSIS — C7951 Secondary malignant neoplasm of bone: Secondary | ICD-10-CM | POA: Diagnosis present

## 2023-10-09 DIAGNOSIS — Z809 Family history of malignant neoplasm, unspecified: Secondary | ICD-10-CM | POA: Diagnosis not present

## 2023-10-09 DIAGNOSIS — Z79899 Other long term (current) drug therapy: Secondary | ICD-10-CM | POA: Diagnosis not present

## 2023-10-09 DIAGNOSIS — G893 Neoplasm related pain (acute) (chronic): Secondary | ICD-10-CM | POA: Diagnosis present

## 2023-10-09 DIAGNOSIS — E876 Hypokalemia: Secondary | ICD-10-CM | POA: Diagnosis present

## 2023-10-09 DIAGNOSIS — G894 Chronic pain syndrome: Secondary | ICD-10-CM | POA: Diagnosis present

## 2023-10-09 DIAGNOSIS — K219 Gastro-esophageal reflux disease without esophagitis: Secondary | ICD-10-CM | POA: Diagnosis present

## 2023-10-09 DIAGNOSIS — F17210 Nicotine dependence, cigarettes, uncomplicated: Secondary | ICD-10-CM | POA: Diagnosis present

## 2023-10-09 DIAGNOSIS — Z9221 Personal history of antineoplastic chemotherapy: Secondary | ICD-10-CM | POA: Diagnosis not present

## 2023-10-09 DIAGNOSIS — Z79890 Hormone replacement therapy (postmenopausal): Secondary | ICD-10-CM | POA: Diagnosis not present

## 2023-10-09 DIAGNOSIS — Z515 Encounter for palliative care: Secondary | ICD-10-CM | POA: Diagnosis not present

## 2023-10-09 DIAGNOSIS — C539 Malignant neoplasm of cervix uteri, unspecified: Secondary | ICD-10-CM | POA: Diagnosis present

## 2023-10-09 DIAGNOSIS — K5903 Drug induced constipation: Secondary | ICD-10-CM | POA: Diagnosis present

## 2023-10-09 DIAGNOSIS — Z923 Personal history of irradiation: Secondary | ICD-10-CM | POA: Diagnosis not present

## 2023-10-09 DIAGNOSIS — N1330 Unspecified hydronephrosis: Secondary | ICD-10-CM | POA: Diagnosis present

## 2023-10-09 DIAGNOSIS — I10 Essential (primary) hypertension: Secondary | ICD-10-CM | POA: Diagnosis present

## 2023-10-09 DIAGNOSIS — R1900 Intra-abdominal and pelvic swelling, mass and lump, unspecified site: Secondary | ICD-10-CM | POA: Diagnosis present

## 2023-10-09 DIAGNOSIS — F419 Anxiety disorder, unspecified: Secondary | ICD-10-CM | POA: Diagnosis present

## 2023-10-09 DIAGNOSIS — Z885 Allergy status to narcotic agent status: Secondary | ICD-10-CM | POA: Diagnosis not present

## 2023-10-09 DIAGNOSIS — Z800 Family history of malignant neoplasm of digestive organs: Secondary | ICD-10-CM | POA: Diagnosis not present

## 2023-10-09 DIAGNOSIS — Z801 Family history of malignant neoplasm of trachea, bronchus and lung: Secondary | ICD-10-CM | POA: Diagnosis not present

## 2023-10-09 NOTE — Progress Notes (Addendum)
 PROGRESS NOTE    Valerie Kerr  FMW:[id number] DOB: Oct 05, 1960 DOA: 10/07/2023 PCP: Delbert Clam, MD   Brief Narrative:  63 y.o. 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 She states a couple of weeks ago she started to have worsening leg pain that she fills like is more nerve pain. She states its hard to describe. She also has numbness, but this is not new. She feels like her back pain is controlled, but more concerned about the worsening of her leg pain. Pain starts in her buttocks and goes down back of leg. She has lost ability to really walk, but this is not acute. Started a few weeks ago. Denies urinary incontinence or saddle paraesthesias.   CT lumbar spine: Progressive bony destruction of the S1 and S2 segments of the sacrum with extensive soft tissue tumor in this vicinity extending into the retroperitoneal/presacral space, along the SI joints, along the sacral spinal canal, and adjacent to the piriformis musculature. The tumor and extends cephalad in front of the L5 vertebral body and is thought to extend in the spinal canal on the right side up to the L4 vertebral level. Tumor extends into the SI joints with erosions of the adjacent portions of the iliac bones.Progressive sclerosis in the L5 vertebra with some indistinctness of portions of the vertebra suggesting early invasion.Again observed is a sagittally oriented fracture of the right side of the L5 vertebral body including the base of the right transverse process and extending into the right pedicle.Mild right hydronephrosis and hydroureter with the right ureter coursing through the  retroperitoneal mass. Consider Urology referral for potential stenting.Lumbar spondylosis and degenerative disc disease, causing impingement at L3-4 and L4-5.  Assessment & Plan:   Principal Problem:   Cancer-related breakthrough pain from Metastatic leiomyosarcoma of the bone /Chronic pain syndrome Active Problems:   Anxiety   Hypokalemia   Essential hypertension   GERD (gastroesophageal reflux disease)   Drug-induced constipation   Cancer-related breakthrough pain from Metastatic leiomyosarcoma of the bone /Chronic pain syndrome-she has stage IV metastatic leiomyosarcoma with 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 Seen by palliative care and oncology. Oncology notes reviewed and appreciated.  Her pain seems to be controlled on methadone  70 mg in the morning 40 mg in the afternoon and evening, oxycodone  30 mg every 4 as needed, Lyrica  100 mg twice daily, Dilaudid  as needed and Decadron .  -CT lumbar spine shows progressing disease: Progressive bony destruction of the S1 and S2 segments of the sacrum with extensive soft tissue tumor in this vicinity extending into the retroperitoneal/presacral space, along the SI joints, along the sacral spinal canal, and adjacent to the piriformis musculature.The tumor and extends cephalad in front of the L5 vertebral body and is thought to extend in the spinal canal on the right side up to the L4 vertebral level. Tumor extends into the SI joints with erosions of the adjacent portions of the iliac bones. -now has Mild right hydronephrosis and hydroureter with the right ureter coursing through the retroperitoneal mass. She is followed by [facility] palliative care I have consulted them for hospice needs. Patient is unable to get out of bed due to pain and weakness.  Anxiety -Continue Xanax  as needed.    Hypokalemia resolved   Essential hypertension stable off  med   GERD (gastroesophageal reflux disease) Continue PPI    Drug-induced constipation Senna  Colace miralax      Estimated body mass index is 29.66 kg/m as calculated from the following:   Height as of this encounter: 5' 1 (1.549 m).   Weight as of this encounter: 71.2 kg.  DVT prophylaxis:lovenox  Code Status:full Family Communication:none Disposition Plan:  Status is: Inpatient Consultants: palliative  Procedures:none Antimicrobials: none  Subjective:  She reports she is unable to get out of bed due to extreme pain in the lower back  Objective: Vitals:   10/08/23 1558 10/08/23 2054 10/09/23 0438 10/09/23 1140  BP: 94/82 (!) 108/50 115/64 123/69  Pulse: 61 75 76 90  Resp: 15 17 16 16   Temp: 98.2 F (36.8 C) 98.7 F (37.1 C) 97.8 F (36.6 C) 98.4 F (36.9 C)  TempSrc: Oral Oral Oral Oral  SpO2: 95% 99% 96% 94%  Weight:      Height:        Intake/Output Summary (Last 24 hours) at 10/09/2023 1201 Last data filed at 10/09/2023 0440 Gross per 24 hour  Intake 600 ml  Output 650 ml  Net -50 ml   Filed Weights   10/07/23 2258  Weight: 71.2 kg    Examination:  General exam: tearful Respiratory system: Clear to auscultation. Respiratory effort normal. Cardiovascular system: reg Gastrointestinal system: Abdomen is nondistended, soft and nontender. No organomegaly or masses felt. Normal bowel sounds heard. Central nervous system: Alert and oriented.  Extremities: no edema   Data Reviewed: I have personally reviewed following labs and imaging studies  CBC: Recent Labs  Lab 10/07/23 1654  WBC 8.2  HGB 11.2*  HCT 36.3  MCV 91.0  PLT 362   Basic Metabolic Panel: Recent Labs  Lab 10/07/23 1654 10/07/23 2310 10/08/23 0834  NA 139  --  138  K 3.3*  --  4.9  CL 100  --  102  CO2 28  --  26  GLUCOSE 77  --  193*  BUN 13  --  17  CREATININE 0.90  --  0.94  CALCIUM  9.1  --  9.2  MG  --  1.9  --    GFR: Estimated Creatinine Clearance: 54.6 mL/min (by C-G formula based on SCr of 0.94 mg/dL). Liver Function Tests: Recent Labs  Lab 10/07/23 1654  AST 13*  ALT  12  ALKPHOS 67  BILITOT 0.5  PROT 6.9  ALBUMIN 3.0*   No results for input(s): LIPASE, AMYLASE in the last 168 hours. No results for input(s): AMMONIA in the last 168 hours. Coagulation Profile: No results for input(s): INR, PROTIME in the last 168 hours. Cardiac Enzymes: No results for input(s): CKTOTAL, CKMB, CKMBINDEX, TROPONINI in the last 168 hours. BNP (last 3 results) No results for input(s): PROBNP in the last 8760 hours. HbA1C: No results for input(s): HGBA1C in the last 72 hours. CBG: No results for input(s): GLUCAP in the last 168 hours. Lipid Profile: No results for input(s): CHOL, HDL, LDLCALC, TRIG, CHOLHDL, LDLDIRECT in the last 72 hours. Thyroid Function Tests: No results for input(s): TSH, T4TOTAL, FREET4, T3FREE, THYROIDAB in the last 72 hours. Anemia Panel: No results for input(s): VITAMINB12, FOLATE, FERRITIN, TIBC, IRON, RETICCTPCT in the last 72 hours. Sepsis Labs: No results for input(s): PROCALCITON, LATICACIDVEN in the last 168 hours.  No results found for this or any previous visit (from the past 240 hours).       Radiology Studies: CT Lumbar Spine Wo Contrast Result  Date: 10/07/2023 CLINICAL DATA:  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 Multiplanar CT image reconstructions were also generated. RADIATION DOSE REDUCTION: This exam was performed according to the departmental dose-optimization program which includes automated exposure control, adjustment of the mA and/or kV according to patient size and/or use of iterative reconstruction technique. COMPARISON:  CT scan 06/08/2023 FINDINGS: Segmentation: The lowest lumbar type non-rib-bearing vertebra is labeled as L5. Alignment: Stable 3 mm degenerative  anterolisthesis at L3-4. Vertebrae: Bony destruction of the S1 and S2 segments of the sacrum with extensive soft tissue tumor in this vicinity extending into the retroperitoneal/presacral space, along the SI joints, along the sacral spinal canal, and adjacent to the piriformis musculature. The tumor right and extends cephalad in front of the L5 vertebral body and is probably extending in the epidural space of the lower lumbar spine as well. The anterior-posterior dimension of the tumor at the projected the level of the S1 superior endplate is currently 5.3 cm, previously 4.2 cm. Local tumor invasion is progressive in this tumor extends into the L5-S1 neural foraminal. Progressive sclerosis in the L5 vertebra with some indistinctness of portions of the vertebra suggesting early invasion. Again observed is a sagittally oriented fracture of the right side of the L5 vertebral body including the base of the right transverse process and extending into the right pedicle. There complete replacement of the right upper sacral ala by tumor. Erosions along the sacroiliac joints likely from SI joint tumor and involving the iliac bones and sacral ala. Calcification along presumed tumor extending in the right lateral recess and right side of the spinal canal from the mid L4 level down to the sacral mass. Paraspinal and other soft tissues: Atherosclerosis is present, including aortoiliac atherosclerotic disease. Mild right hydronephrosis and hydroureter with the right ureter coursing through the retroperitoneal mass. Consider urology referral for potential stenting. Disc levels: L1-2: Impingement.  Mild disc bulge. L2-3: No overt impingement.  Disc bulge. L3-4: Prominent central stenosis and moderate bilateral foraminal stenosis due to disc bulge, facet arthropathy, and ligamentum flavum redundancy as well as disc uncovering. L4-5: Prominent right and moderate left foraminal stenosis and moderate central stenosis due to disc bulge,  calcified mass in the right lateral recess and right neural foramen, and facet arthropathy. L5-S1: The thecal sac is completely effaced by tumor, as are the neural foramina. IMPRESSION: 1. Progressive bony destruction of the S1 and S2 segments of the sacrum with extensive soft tissue tumor in this vicinity extending into the retroperitoneal/presacral space, along the SI joints, along the sacral spinal canal, and adjacent to the piriformis musculature. The tumor and extends cephalad in front of the L5 vertebral body and is thought to extend in the spinal canal on the right side up to the L4 vertebral level. Tumor extends into the SI joints with erosions of the adjacent portions of the iliac bones. 2. Progressive sclerosis in the L5 vertebra with some indistinctness of portions of the vertebra suggesting early invasion. 3. Again observed is a sagittally oriented fracture of the right side of the L5 vertebral body including the base of the right transverse process and extending into the right pedicle. 4. Mild right hydronephrosis and hydroureter with the right ureter coursing through the retroperitoneal mass. Consider Urology referral for potential stenting. 5. Lumbar spondylosis and degenerative disc disease, causing impingement at L3-4 and L4-5. 6.  Aortic Atherosclerosis (ICD10-I70.0). Electronically Signed   By: Ryan Salvage M.D.   On: 10/07/2023 18:56    Scheduled Meds:  calcium -vitamin D   1 tablet Oral BID WC   dexamethasone   4 mg Oral Q8H   docusate sodium   100 mg Oral Daily   enoxaparin  (LOVENOX ) injection  40 mg Subcutaneous Q24H   fluticasone   1 spray Each Nare Daily   lidocaine   1 patch Transdermal Q24H   loratadine   10 mg Oral Daily   methadone   40 mg Oral 2 times per day   methadone   70 mg Oral QAC breakfast   pantoprazole   40 mg Oral Daily   pregabalin   100 mg Oral BID   senna-docusate  2 tablet Oral QHS   Continuous Infusions:   LOS: 0 days   Almarie KANDICE Hoots,  MD  10/09/2023, 12:01 PM

## 2023-10-09 NOTE — Progress Notes (Signed)
 Daily Progress Note   Patient Name: Valerie Kerr       Date: 10/09/2023 DOB: 05/24/1960  Age: 63 y.o. MRN#: [id number] Attending Physician: Will Almarie MATSU, MD Primary Care Physician: Delbert Clam, MD Admit Date: 10/07/2023  Reason for Consultation/Follow-up: Establishing goals of care, Non pain symptom management, and Pain control  Subjective: Ongoing lower back pain.  She has been seen and evaluated by oncology.  Patient known to palliative service at cancer center as well.  Chart reviewed, discussed with TRH MD as well as TOC.  Length of Stay: 0  Current Medications: Scheduled Meds:   calcium -vitamin D   1 tablet Oral BID WC   dexamethasone   4 mg Oral Q8H   docusate sodium   100 mg Oral Daily   enoxaparin  (LOVENOX ) injection  40 mg Subcutaneous Q24H   fluticasone   1 spray Each Nare Daily   lidocaine   1 patch Transdermal Q24H   loratadine   10 mg Oral Daily   methadone   40 mg Oral 2 times per day   methadone   70 mg Oral QAC breakfast   pantoprazole   40 mg Oral Daily   pregabalin   100 mg Oral BID   senna-docusate  2 tablet Oral QHS    Continuous Infusions:   PRN Meds: ALPRAZolam , carisoprodol , HYDROmorphone  (DILAUDID ) injection, ondansetron  **OR** ondansetron  (ZOFRAN ) IV, oxycodone   Physical Exam         Awake alert In pain tearful Back pain  Vital Signs: BP 123/69   Pulse 90   Temp 98.4 F (36.9 C) (Oral)   Resp 16   Ht 5' 1 (1.549 m)   Wt 71.2 kg   SpO2 94%   BMI 29.66 kg/m  SpO2: SpO2: 94 % O2 Device: O2 Device: Room Air O2 Flow Rate: O2 Flow Rate (L/min): 0 L/min  Intake/output summary:  Intake/Output Summary (Last 24 hours) at 10/09/2023 1447 Last data filed at 10/09/2023 0440 Gross per 24 hour  Intake 360 ml  Output 650 ml  Net -290 ml    LBM: Last BM Date : 10/06/23 Baseline Weight: Weight: 71.2 kg Most recent weight: Weight: 71.2 kg       Palliative Assessment/Data:      Patient Active Problem List   Diagnosis Date Noted   High risk medication use 06/09/2023   Drug-induced constipation 06/09/2023   Counseling  and coordination of care 06/09/2023   Medication management 06/09/2023   Palliative care encounter 06/09/2023   Malignant neoplasm metastatic to bone (HCC) 06/09/2023   Metastatic leiomyosarcoma of bone (HCC) 06/08/2023   Chronic pain syndrome 06/08/2023   Generalized anxiety disorder 06/08/2023   Cancer-related breakthrough pain from Metastatic leiomyosarcoma of the bone /Chronic pain syndrome 06/08/2023   Medical non-compliance 05/26/2023   Leiomyosarcoma (HCC) 05/16/2023   Cancer associated pain 05/16/2023   Goals of care, counseling/discussion 05/16/2023   Physical debility 05/16/2023   Spinal stenosis 01/18/2017   Insomnia 01/18/2017   Lumbar herniated disc 09/05/2016   Cervical radiculopathy 09/05/2016   Neuropathy 09/05/2016   Depression 08/15/2016   Inguinal lymphadenopathy 06/30/2016   Thrombocytopenia (HCC) 05/24/2016   Dehydration 05/24/2016   Anxiety 05/24/2016   Nausea and vomiting 05/17/2016   Leukopenia due to antineoplastic chemotherapy (HCC) 05/12/2016   Hypomagnesemia 05/12/2016   Hypokalemia 05/12/2016   Anemia due to antineoplastic chemotherapy 05/04/2016   Continuous tobacco abuse 04/01/2016   Poor dentition 04/01/2016   Essential hypertension 04/01/2016   Methadone  use 04/01/2016   Allergic sinusitis 04/01/2016   GERD (gastroesophageal reflux disease) 04/01/2016   Cervical cancer (HCC) 03/03/2016    Palliative Care Assessment & Plan   Patient Profile:    Assessment:  Cancer-related breakthrough pain from Metastatic leiomyosarcoma of the bone /Chronic pain syndrome-she has stage IV metastatic leiomyosarcoma with 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 Progressive bony destruction  of S1 and S2 segments of the sacrum Extensive soft tissue tumor Uncontrolled pain   Recommendations/Plan: Patient already followed by [facility] [place].  Oncology has also reviewed the data.  Patient follows with palliative care at cancer center.  Overall, agree with recommendation for switching from home-based palliative to home-based hospice services at this time.  Otherwise, continue current dose of methadone , has Dilaudid  on an as-needed basis, has dexamethasone , has Lyrica  Code Status:    Code Status Orders  (From admission, onward)           Start     Ordered   10/07/23 2117  Full code  Continuous       Question:  By:  Answer:  Consent: discussion documented in EHR   10/07/23 2119           Code Status History     Date Active Date Inactive Code Status Order ID Comments User Context   06/08/2023 2149 06/19/2023 2236 Full Code [id number]  Lee Kingfisher, MD ED   04/27/2023 1038 04/28/2023 0504 Full Code [id number]  Alona Corners, DO HOV       Prognosis:  < 6 months  Discharge Planning: Home with Hospice  Care plan was discussed with  IDT  Thank you for allowing the Palliative Medicine Team to assist in the care of this patient. Low MDM.      Greater than 50%  of this time was spent counseling and coordinating care related to the above assessment and plan.  Lonia Serve, MD  Please contact Palliative Medicine Team phone at [phone] for questions and concerns.

## 2023-10-09 NOTE — Progress Notes (Signed)
   Dr. Will called me to update that pt is in the hospital and need hospice discussion. I have spoken to the pt and her daughter Welby whom she resides with. She will need some equipment prior to d/c home. They are both in agreement with hospice care. She has requested a hospital bed with rails and overbed table which has been ordered via [employer] for delivery. Our MD has approved the pt for hospice care and we plan to meet pt daughter and pt at bedside in am to complete paperwork for hospice care prior to d/c home from hospital.   Magdalena Berber RN [phone]

## 2023-10-09 NOTE — Progress Notes (Signed)
   This pt is currently active with [facility] and home based Palliative Care program provided by [facility] [place]. We are following while in hospital. She is eligible to be d/c back with PC services at home or if alternate d/c plan needed at d/c will be happy to assist as well   Magdalena Berber RN [phone]

## 2023-10-09 NOTE — Plan of Care (Signed)

## 2023-10-09 NOTE — Progress Notes (Signed)
 Valerie Kerr   DOB:[birth year]   FM#:[id number]    ASSESSMENT & PLAN:  Stage IV metastatic leiomyosarcoma I have many meetings with the patient and family The patient has very poor understanding of her disease process Repeatedly, I told her that she is not a candidate for systemic chemotherapy due to her poor functional status I explained to her again today she is not a surgical candidate She is already have extensive radiation treatment in a palliative fashion I reiterate that I will not be offering her any form of palliative chemotherapy as it is unlikely going to help her and potentially can reduce her quality of life I recommend focus on supportive care only and encouraged her to enroll in hospice  Cancer associated pain She has been getting all her pain medication refills through palliative care team We discussed the role of hospice but she is undecided  Mild hydronephrosis Due to tumor compression I do not recommend stent placement on nephrostomy tube placement as it is not likely going to benefit her in any fashion and potentially can increase risk of pain  Goals of care discussion Adequate pain control As before, the patient has been reluctant to accept hospice enrollment We discussed additional benefits that she can get with hospice being on board  Discharge planning Will defer to primary service  Almarie Bedford, MD 10/09/2023 10:30 AM  Subjective:  Patient well-known to me.  I have not seen her since March 2025 She has metastatic leiomyosarcoma to her bones, status post radiation therapy Over the past 3 months, she has been receiving care through outpatient palliative care only and has been getting all her pain medication refills through palliative care team  She was admitted to the hospital 2 days ago after significant leg pain and inability to walk She had labs and imaging study and being admitted for pain management This morning, she felt her pain is reasonably  controlled  I have personally reviewed her CT imaging dated October 07, 2023 along with her labs   Objective:  Vitals:   10/08/23 2054 10/09/23 0438  BP: (!) 108/50 115/64  Pulse: 75 76  Resp: 17 16  Temp: 98.7 F (37.1 C) 97.8 F (36.6 C)  SpO2: 99% 96%     Intake/Output Summary (Last 24 hours) at 10/09/2023 1030 Last data filed at 10/09/2023 0440 Gross per 24 hour  Intake 600 ml  Output 650 ml  Net -50 ml

## 2023-10-09 NOTE — Plan of Care (Signed)

## 2023-10-10 ENCOUNTER — Other Ambulatory Visit (HOSPITAL_COMMUNITY): Payer: Self-pay

## 2023-10-10 DIAGNOSIS — G893 Neoplasm related pain (acute) (chronic): Secondary | ICD-10-CM | POA: Diagnosis not present

## 2023-10-10 DIAGNOSIS — Z515 Encounter for palliative care: Secondary | ICD-10-CM

## 2023-10-10 MED ORDER — OXYCODONE HCL 30 MG PO TABS
30.0000 mg | ORAL_TABLET | ORAL | 0 refills | Status: DC | PRN
  Filled 2023-10-10: qty 90, 15d supply, fill #0

## 2023-10-10 MED ORDER — ONDANSETRON HCL 4 MG PO TABS
4.0000 mg | ORAL_TABLET | Freq: Four times a day (QID) | ORAL | 0 refills | Status: DC | PRN
  Filled 2023-10-10: qty 20, 5d supply, fill #0

## 2023-10-10 MED ORDER — METHADONE HCL 10 MG PO TABS
ORAL_TABLET | ORAL | 0 refills | Status: DC
  Filled 2023-10-10: qty 225, 15d supply, fill #0

## 2023-10-10 MED ORDER — CYCLOBENZAPRINE HCL 10 MG PO TABS
10.0000 mg | ORAL_TABLET | Freq: Three times a day (TID) | ORAL | 0 refills | Status: DC | PRN
  Filled 2023-10-10: qty 45, 15d supply, fill #0

## 2023-10-10 MED ORDER — SENNOSIDES-DOCUSATE SODIUM 8.6-50 MG PO TABS: 2.0000 | ORAL_TABLET | Freq: Every day | ORAL | Status: DC

## 2023-10-10 MED ORDER — ALPRAZOLAM 0.25 MG PO TABS
0.1250 mg | ORAL_TABLET | Freq: Two times a day (BID) | ORAL | 0 refills | Status: DC | PRN
  Filled 2023-10-10: qty 30, 30d supply, fill #0

## 2023-10-10 NOTE — TOC Transition Note (Signed)
 Transition of Care [facility]) - Discharge Note   Patient Details  Name: Valerie Kerr MRN: [id number] Date of Birth: 1961-03-17  [facility] CM/SW Contact:  [name], RN Phone Number:[phone]  10/10/2023, 1:04 PM   Clinical Narrative:    Patient with discharge orders. Plan to d/c home with hospice. Address has been verified with daughter Deland. Transportation has been arranged per PTAR. D/c packet is at nurses station. No other TOC needs noted. TOC will sign off.    Final next level of care: Home w Hospice Care Barriers to Discharge: No Barriers Identified   Patient Goals and CMS Choice            Discharge Placement                       Discharge Plan and Services Additional resources added to the After Visit Summary for                  DME Arranged: Hospice Equipment Package C DME Agency: Other - Comment (Per Hospice)       HH Arranged: NA HH Agency: NA        Social Drivers of Health (SDOH) Interventions SDOH Screenings   Food Insecurity: No Food Insecurity (10/07/2023)  Housing: Low Risk  (10/07/2023)  Transportation Needs: No Transportation Needs (10/07/2023)  Utilities: Not At Risk (10/07/2023)  Depression (PHQ2-9): Low Risk  (04/13/2023)  Social Connections: Feeling Socially Integrated (04/19/2023)   Received from [facility]  Tobacco Use: High Risk (10/08/2023)  Health Literacy: Adequate Health Literacy (04/19/2023)   Received from [facility]     Readmission Risk Interventions     No data to display

## 2023-10-10 NOTE — Progress Notes (Signed)
 TOC meds delivered in a secure bag to patient in room . Methadone  will not be available until tomorrow 7/15. Pt verbally confirmed she had some methadone  left at home & a family member would be able to pick up on 7/15 at the [facility]. AVS reviewed w/ pt & granddaughter as noted. No other questions at this time. PIV left intact until PTAR arrives

## 2023-10-10 NOTE — Plan of Care (Signed)
  Daily Progress Note   Patient Name: Valerie Kerr       Date: 10/10/2023 DOB: January 20, 1961  Age: 63 y.o. MRN#: [id number] Attending Physician: Will Almarie MATSU, MD Primary Care Physician: Delbert Clam, MD Admit Date: 10/07/2023 Length of Stay: 1 day  Reviewed the EMR.  Patient being discharged today with home hospice support via [facility] [place].  TOC assisted with coordination of discharge planning.  Palliative medicine team will be available if needed.   Tinnie Radar, DO Palliative Care Provider PMT # [phone]  No Charge Note

## 2023-10-10 NOTE — Plan of Care (Signed)

## 2023-10-10 NOTE — Plan of Care (Signed)
 Pt refuses to turn all shift, stated it hurts too bad. Pt educated about maintaining skin integrity and preventing skin breakdown, pt verbalized understanding.    Problem: Education: Goal: Knowledge of General Education information will improve Description: Including pain rating scale, medication(s)/side effects and non-pharmacologic comfort measures Outcome: Progressing   Problem: Nutrition: Goal: Adequate nutrition will be maintained Outcome: Progressing   Problem: Elimination: Goal: Will not experience complications related to bowel motility Outcome: Progressing   Problem: Pain Managment: Goal: General experience of comfort will improve and/or be controlled Outcome: Progressing

## 2023-10-10 NOTE — Discharge Summary (Signed)
 Physician Discharge Summary  Valerie Kerr FMW:[id number] DOB: [birth year] DOA: 10/07/2023  PCP: Delbert Clam, MD  Admit date: 10/07/2023 Discharge date: 10/10/2023  Admitted From: Home Disposition: Home Recommendations for Outpatient Follow-up: Home with hospice  Home Health: None Equipment/Devices: Hospital bed provided by hospice  discharge Condition: Hospice CODE STATUS: Full code  Diet recommendation: regular diet Brief/Interim Summary:  63 y.o. 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 She states a couple of weeks ago she started to have worsening leg pain that she fills like is more nerve pain. She states its hard to describe. She also has numbness, but this is not new. She feels like her back pain is controlled, but more concerned about the worsening of her leg pain. Pain starts in her buttocks and goes down back of leg. She has lost ability to really walk, but this is not acute. Started a few weeks ago. Denies urinary incontinence or saddle paraesthesias.   CT lumbar spine: Progressive bony destruction of the S1 and S2 segments of the sacrum with extensive soft tissue tumor in this vicinity extending into the retroperitoneal/presacral space, along the SI joints, along the sacral spinal canal, and adjacent to the piriformis musculature. The tumor and extends cephalad in front of the L5 vertebral body and is thought to extend in the spinal canal on the right side up to the L4 vertebral level. Tumor extends into the SI joints with erosions of the adjacent portions of the iliac bones.Progressive sclerosis in the L5 vertebra with some  indistinctness of portions of the vertebra suggesting early invasion.Again observed is a sagittally oriented fracture of the right side of the L5 vertebral body including the base of the right transverse process and extending into the right pedicle.Mild right hydronephrosis and hydroureter with the right ureter coursing through the retroperitoneal mass. Consider Urology referral for potential stenting.Lumbar spondylosis and degenerative disc disease, causing impingement at L3-4 and L4-5.   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 Seen by palliative care and oncology.  Per oncology she is not a candidate for systemic chemotherapy due to poor functional status.  She is not a surgical candidate.  She has already had extensive radiation treatment in a palliative fashion.  They recommended hospice and comfort care.  Her pain seems to be controlled on methadone  70 mg in the morning 40 mg in the afternoon and evening, oxycodone  30 mg every 4 as needed, Lyrica  100 mg twice daily, Dilaudid  as needed and Decadron .   -CT lumbar spine shows progressing disease: Progressive bony destruction of the S1 and S2 segments of the sacrum with extensive soft tissue tumor in this vicinity extending into the retroperitoneal/presacral space, along the SI joints, along the sacral spinal canal, and adjacent to the piriformis musculature.The tumor and extends cephalad in front of the L5 vertebral body and is thought to extend in the spinal canal on the right side up to the  L4 vertebral level. Tumor extends into the SI  joints with erosions of the adjacent portions of the iliac bones. -now has Mild right hydronephrosis and hydroureter with the right ureter coursing through the retroperitoneal mass. She is followed by [facility] palliative care . Patient is unable to get out of bed due to pain and weakness.   Anxiety -Continue Xanax  as needed.    Hypokalemia resolved   Essential hypertension stable off med   GERD (gastroesophageal reflux disease) Continue PPI    Drug-induced constipation continue senna Colace and MiraLAX     Estimated body mass index is 29.66 kg/m as calculated from the following:   Height as of this encounter: 5' 1 (1.549 m).   Weight as of this encounter: 71.2 kg.  Discharge Instructions  Discharge Instructions     Diet - low sodium heart healthy   Complete by: As directed    Increase activity slowly   Complete by: As directed       Allergies as of 10/10/2023       Reactions   Codeine Itching, Rash   Fentanyl  Rash   Skin rash, local swelling from patch   Hydrocodone -acetaminophen  Rash   Tramadol Other (See Comments)   GI upset,also trembling sensation        Medication List     STOP taking these medications    lisinopril -hydrochlorothiazide  20-25 MG tablet Commonly known as: ZESTORETIC        TAKE these medications    ALPRAZolam  0.25 MG tablet Commonly known as: XANAX  Take 0.5 tablets (0.125 mg total) by mouth 2 (two) times daily as needed for anxiety.   Biofreeze 10 % Crea Generic drug: Menthol  (Topical Analgesic) Apply topically 3 (three) times daily as needed. What changed: reasons to take this   cetirizine  10 MG tablet Commonly known as: ZYRTEC  TAKE 1 TABLET BY MOUTH EVERY DAY   cyclobenzaprine  10 MG tablet Commonly known as: FLEXERIL  Take 1 tablet (10 mg total) by mouth 3 (three) times daily as needed for muscle spasms.   fluticasone  50 MCG/ACT nasal spray Commonly known as: FLONASE  INSTILL 1 SPRAY INTO EACH NOSTRIL DAILY What  changed: See the new instructions.   ibuprofen  600 MG tablet Commonly known as: ADVIL  Take 1 tablet (600 mg total) by mouth every 8 (eight) hours as needed for moderate pain (pain score 4-6) or mild pain (pain score 1-3).   lidocaine  5 % Commonly known as: LIDODERM  Place 1 patch onto the skin daily. Remove & Discard patch within 12 hours or as directed by MD What changed:  when to take this reasons to take this additional instructions   methadone  10 MG tablet Commonly known as: DOLOPHINE  Take 7 tablets (70 mg) in the morning, 4 tablets (40 mg) in the afternoon and 4 tablets (40 mg) in the evening What changed:  how much to take how to take this when to take this additional instructions   omeprazole  20 MG capsule Commonly known as: PRILOSEC Take 1 capsule (20 mg total) by mouth daily. Must have office visit for refills   ondansetron  4 MG tablet Commonly known as: ZOFRAN  Take 1 tablet (4 mg total) by mouth every 6 (six) hours as needed for nausea.   oxycodone  30 MG immediate release tablet Commonly known as: ROXICODONE  Take 1 tablet (30 mg total) by mouth every 4 (four) hours as needed. What changed: reasons to take this   Allie Gutta Calcium  w/D 500-5 MG-MCG Tabs Take 1 tablet by mouth 2 (two) times daily with  a meal.   pregabalin  100 MG capsule Commonly known as: Lyrica  Take 1 capsule (100 mg total) by mouth 2 (two) times daily.   senna-docusate 8.6-50 MG tablet Commonly known as: Senokot-S Take 2 tablets by mouth at bedtime.        Follow-up Information     Delbert Clam, MD Follow up.   Specialty: Family Medicine Contact information: [address] [place] [address] [phone]                Allergies  Allergen Reactions   Codeine Itching and Rash   Fentanyl  Rash    Skin rash, local swelling from patch   Hydrocodone -Acetaminophen  Rash   Tramadol Other (See Comments)    GI upset,also trembling sensation     Consultations:oncology palliative   Procedures/Studies: CT Lumbar Spine Wo Contrast Result Date: 10/07/2023 CLINICAL DATA:  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 Multiplanar CT image reconstructions were also generated. RADIATION DOSE REDUCTION: This exam was performed according to the departmental dose-optimization program which includes automated exposure control, adjustment of the mA and/or kV according to patient size and/or use of iterative reconstruction technique. COMPARISON:  CT scan 06/08/2023 FINDINGS: Segmentation: The lowest lumbar type non-rib-bearing vertebra is labeled as L5. Alignment: Stable 3 mm degenerative anterolisthesis at L3-4. Vertebrae: Bony destruction of the S1 and S2 segments of the sacrum with extensive soft tissue tumor in this vicinity extending into the retroperitoneal/presacral space, along the SI joints, along the sacral spinal canal, and adjacent to the piriformis musculature. The tumor right and extends cephalad in front of the L5 vertebral body and is probably extending in the epidural space of the lower lumbar spine as well. The anterior-posterior dimension of the tumor at the projected the level of the S1 superior endplate is currently 5.3 cm, previously 4.2 cm. Local tumor invasion is progressive in this tumor extends into the L5-S1 neural foraminal. Progressive sclerosis in the L5 vertebra with some indistinctness of portions of the vertebra suggesting early invasion. Again observed is a sagittally oriented fracture of the right side of the L5 vertebral body including the base of the right transverse process and extending into the right pedicle. There complete replacement of the right upper sacral ala by tumor. Erosions along the sacroiliac joints likely from SI joint tumor and  involving the iliac bones and sacral ala. Calcification along presumed tumor extending in the right lateral recess and right side of the spinal canal from the mid L4 level down to the sacral mass. Paraspinal and other soft tissues: Atherosclerosis is present, including aortoiliac atherosclerotic disease. Mild right hydronephrosis and hydroureter with the right ureter coursing through the retroperitoneal mass. Consider urology referral for potential stenting. Disc levels: L1-2: Impingement.  Mild disc bulge. L2-3: No overt impingement.  Disc bulge. L3-4: Prominent central stenosis and moderate bilateral foraminal stenosis due to disc bulge, facet arthropathy, and ligamentum flavum redundancy as well as disc uncovering. L4-5: Prominent right and moderate left foraminal stenosis and moderate central stenosis due to disc bulge, calcified mass in the right lateral recess and right neural foramen, and facet arthropathy. L5-S1: The thecal sac is completely effaced by tumor, as are the neural foramina. IMPRESSION: 1. Progressive bony destruction of the S1 and S2 segments of the sacrum with extensive soft tissue tumor in this vicinity extending into the retroperitoneal/presacral space, along the  SI joints, along the sacral spinal canal, and adjacent to the piriformis musculature. The tumor and extends cephalad in front of the L5 vertebral body and is thought to extend in the spinal canal on the right side up to the L4 vertebral level. Tumor extends into the SI joints with erosions of the adjacent portions of the iliac bones. 2. Progressive sclerosis in the L5 vertebra with some indistinctness of portions of the vertebra suggesting early invasion. 3. Again observed is a sagittally oriented fracture of the right side of the L5 vertebral body including the base of the right transverse process and extending into the right pedicle. 4. Mild right hydronephrosis and hydroureter with the right ureter coursing through the  retroperitoneal mass. Consider Urology referral for potential stenting. 5. Lumbar spondylosis and degenerative disc disease, causing impingement at L3-4 and L4-5. 6.  Aortic Atherosclerosis (ICD10-I70.0). Electronically Signed   By: Ryan Salvage M.D.   On: 10/07/2023 18:56   (Echo, Carotid, EGD, Colonoscopy, ERCP)    Subjective:  No new c/o  Going home today with hospice  Discharge Exam: Vitals:   10/09/23 2128 10/10/23 0522  BP: (!) 150/87 (!) 140/84  Pulse: 81 80  Resp: 16 18  Temp: 98.1 F (36.7 C) 97.7 F (36.5 C)  SpO2: 95% 96%   Vitals:   10/09/23 0438 10/09/23 1140 10/09/23 2128 10/10/23 0522  BP: 115/64 123/69 (!) 150/87 (!) 140/84  Pulse: 76 90 81 80  Resp: 16 16 16 18   Temp: 97.8 F (36.6 C) 98.4 F (36.9 C) 98.1 F (36.7 C) 97.7 F (36.5 C)  TempSrc: Oral Oral Oral Oral  SpO2: 96% 94% 95% 96%  Weight:      Height:        General: Pt is alert, awake, not in acute distress Cardiovascular: reg Respiratory: CTA bilaterally, no wheezing, no rhonchi Abdominal: Soft, NT, ND, bowel sounds + Extremities: no edema, no cyanosis    The results of significant diagnostics from this hospitalization (including imaging, microbiology, ancillary and laboratory) are listed below for reference.     Microbiology: No results found for this or any previous visit (from the past 240 hours).   Labs: BNP (last 3 results) No results for input(s): BNP in the last 8760 hours. Basic Metabolic Panel: Recent Labs  Lab 10/07/23 1654 10/07/23 2310 10/08/23 0834  NA 139  --  138  K 3.3*  --  4.9  CL 100  --  102  CO2 28  --  26  GLUCOSE 77  --  193*  BUN 13  --  17  CREATININE 0.90  --  0.94  CALCIUM  9.1  --  9.2  MG  --  1.9  --    Liver Function Tests: Recent Labs  Lab 10/07/23 1654  AST 13*  ALT 12  ALKPHOS 67  BILITOT 0.5  PROT 6.9  ALBUMIN 3.0*   No results for input(s): LIPASE, AMYLASE in the last 168 hours. No results for input(s): AMMONIA  in the last 168 hours. CBC: Recent Labs  Lab 10/07/23 1654  WBC 8.2  HGB 11.2*  HCT 36.3  MCV 91.0  PLT 362   Cardiac Enzymes: No results for input(s): CKTOTAL, CKMB, CKMBINDEX, TROPONINI in the last 168 hours. BNP: Invalid input(s): POCBNP CBG: No results for input(s): GLUCAP in the last 168 hours. D-Dimer No results for input(s): DDIMER in the last 72 hours. Hgb A1c No results for input(s): HGBA1C in the last 72 hours. Lipid Profile No results  for input(s): CHOL, HDL, LDLCALC, TRIG, CHOLHDL, LDLDIRECT in the last 72 hours. Thyroid function studies No results for input(s): TSH, T4TOTAL, T3FREE, THYROIDAB in the last 72 hours.  Invalid input(s): FREET3 Anemia work up No results for input(s): VITAMINB12, FOLATE, FERRITIN, TIBC, IRON, RETICCTPCT in the last 72 hours. Urinalysis    Component Value Date/Time   COLORURINE AMBER (A) 06/12/2023 1027   APPEARANCEUR CLOUDY (A) 06/12/2023 1027   LABSPEC 1.011 06/12/2023 1027   LABSPEC 1.010 08/26/2016 1131   PHURINE 5.0 06/12/2023 1027   GLUCOSEU NEGATIVE 06/12/2023 1027   GLUCOSEU Negative 08/26/2016 1131   HGBUR SMALL (A) 06/12/2023 1027   BILIRUBINUR NEGATIVE 06/12/2023 1027   BILIRUBINUR Negative 08/26/2016 1131   KETONESUR NEGATIVE 06/12/2023 1027   PROTEINUR 100 (A) 06/12/2023 1027   UROBILINOGEN 0.2 08/26/2016 1131   NITRITE POSITIVE (A) 06/12/2023 1027   LEUKOCYTESUR LARGE (A) 06/12/2023 1027   LEUKOCYTESUR Trace 08/26/2016 1131   Sepsis Labs Recent Labs  Lab 10/07/23 1654  WBC 8.2   Microbiology No results found for this or any previous visit (from the past 240 hours).   Time coordinating discharge: 39 min SIGNED:   Almarie KANDICE Hoots, MD  [facility] 10/10/2023, 10:29 AM

## 2023-10-11 ENCOUNTER — Inpatient Hospital Stay: Admitting: Nurse Practitioner

## 2023-10-11 ENCOUNTER — Inpatient Hospital Stay (HOSPITAL_COMMUNITY)
Admission: EM | Admit: 2023-10-11 | Discharge: 2023-10-17 | DRG: 948 | Disposition: A | Discharge status: Hospice | Attending: Family Medicine | Admitting: Family Medicine

## 2023-10-11 ENCOUNTER — Encounter (HOSPITAL_COMMUNITY): Payer: Self-pay

## 2023-10-11 ENCOUNTER — Other Ambulatory Visit (HOSPITAL_COMMUNITY): Payer: Self-pay

## 2023-10-11 ENCOUNTER — Other Ambulatory Visit: Payer: Self-pay

## 2023-10-11 DIAGNOSIS — F17210 Nicotine dependence, cigarettes, uncomplicated: Secondary | ICD-10-CM | POA: Diagnosis present

## 2023-10-11 DIAGNOSIS — C801 Malignant (primary) neoplasm, unspecified: Secondary | ICD-10-CM | POA: Diagnosis present

## 2023-10-11 DIAGNOSIS — F418 Other specified anxiety disorders: Secondary | ICD-10-CM

## 2023-10-11 DIAGNOSIS — Z66 Do not resuscitate: Secondary | ICD-10-CM | POA: Diagnosis not present

## 2023-10-11 DIAGNOSIS — I10 Essential (primary) hypertension: Secondary | ICD-10-CM | POA: Diagnosis present

## 2023-10-11 DIAGNOSIS — F419 Anxiety disorder, unspecified: Secondary | ICD-10-CM | POA: Diagnosis present

## 2023-10-11 DIAGNOSIS — R7401 Elevation of levels of liver transaminase levels: Secondary | ICD-10-CM | POA: Diagnosis present

## 2023-10-11 DIAGNOSIS — G893 Neoplasm related pain (acute) (chronic): Principal | ICD-10-CM | POA: Diagnosis present

## 2023-10-11 DIAGNOSIS — Z515 Encounter for palliative care: Secondary | ICD-10-CM | POA: Diagnosis not present

## 2023-10-11 DIAGNOSIS — Z9221 Personal history of antineoplastic chemotherapy: Secondary | ICD-10-CM

## 2023-10-11 DIAGNOSIS — K219 Gastro-esophageal reflux disease without esophagitis: Secondary | ICD-10-CM | POA: Diagnosis present

## 2023-10-11 DIAGNOSIS — Z79891 Long term (current) use of opiate analgesic: Secondary | ICD-10-CM

## 2023-10-11 DIAGNOSIS — C7951 Secondary malignant neoplasm of bone: Secondary | ICD-10-CM | POA: Diagnosis not present

## 2023-10-11 DIAGNOSIS — G894 Chronic pain syndrome: Secondary | ICD-10-CM | POA: Diagnosis present

## 2023-10-11 DIAGNOSIS — C499 Malignant neoplasm of connective and soft tissue, unspecified: Secondary | ICD-10-CM | POA: Diagnosis not present

## 2023-10-11 DIAGNOSIS — C419 Malignant neoplasm of bone and articular cartilage, unspecified: Secondary | ICD-10-CM

## 2023-10-11 DIAGNOSIS — D630 Anemia in neoplastic disease: Secondary | ICD-10-CM | POA: Diagnosis present

## 2023-10-11 DIAGNOSIS — Z8541 Personal history of malignant neoplasm of cervix uteri: Secondary | ICD-10-CM

## 2023-10-11 DIAGNOSIS — Z7952 Long term (current) use of systemic steroids: Secondary | ICD-10-CM

## 2023-10-11 DIAGNOSIS — E876 Hypokalemia: Secondary | ICD-10-CM | POA: Diagnosis present

## 2023-10-11 DIAGNOSIS — Z923 Personal history of irradiation: Secondary | ICD-10-CM

## 2023-10-11 LAB — COMPREHENSIVE METABOLIC PANEL WITH GFR
ALT: 67 U/L — ABNORMAL HIGH (ref 0–44)
AST: 44 U/L — ABNORMAL HIGH (ref 15–41)
Albumin: 2.9 g/dL — ABNORMAL LOW (ref 3.5–5.0)
Alkaline Phosphatase: 64 U/L (ref 38–126)
Anion gap: 9 (ref 5–15)
BUN: 24 mg/dL — ABNORMAL HIGH (ref 8–23)
CO2: 29 mmol/L (ref 22–32)
Calcium: 7.9 mg/dL — ABNORMAL LOW (ref 8.9–10.3)
Chloride: 104 mmol/L (ref 98–111)
Creatinine, Ser: 0.79 mg/dL (ref 0.44–1.00)
GFR, Estimated: >60 mL/min (ref >60)
Glucose, Bld: 79 mg/dL (ref 70–99)
Potassium: 3 mmol/L — ABNORMAL LOW (ref 3.5–5.1)
Sodium: 142 mmol/L (ref 135–145)
Total Bilirubin: 0.3 mg/dL (ref 0.0–1.2)
Total Protein: 6.6 g/dL (ref 6.5–8.1)

## 2023-10-11 LAB — CBC WITH DIFFERENTIAL/PLATELET
Abs Immature Granulocytes: 0.08 K/uL — ABNORMAL HIGH (ref 0.00–0.07)
Basophils Absolute: 0 K/uL (ref 0.0–0.1)
Basophils Relative: 0 %
Eosinophils Absolute: 0 K/uL (ref 0.0–0.5)
Eosinophils Relative: 1 %
HCT: 38.5 % (ref 36.0–46.0)
Hemoglobin: 11.6 g/dL — ABNORMAL LOW (ref 12.0–15.0)
Immature Granulocytes: 1 %
Lymphocytes Relative: 12 %
Lymphs Abs: 1 K/uL (ref 0.7–4.0)
MCH: 27.7 pg (ref 26.0–34.0)
MCHC: 30.1 g/dL (ref 30.0–36.0)
MCV: 91.9 fL (ref 80.0–100.0)
Monocytes Absolute: 0.8 K/uL (ref 0.1–1.0)
Monocytes Relative: 9 %
Neutro Abs: 6.8 K/uL (ref 1.7–7.7)
Neutrophils Relative %: 77 %
Platelets: 292 K/uL (ref 150–400)
RBC: 4.19 MIL/uL (ref 3.87–5.11)
RDW: 16.2 % — ABNORMAL HIGH (ref 11.5–15.5)
WBC: 8.8 K/uL (ref 4.0–10.5)
nRBC: 0 % (ref 0.0–0.2)

## 2023-10-11 MED ORDER — METHADONE HCL 5 MG PO TABS
40.0000 mg | ORAL_TABLET | ORAL | Status: DC
  Administered 2023-10-11 – 2023-10-16 (×11): 40 mg via ORAL
  Filled 2023-10-11 (×11): qty 8

## 2023-10-11 MED ORDER — HYDROMORPHONE HCL 1 MG/ML IJ SOLN
1.0000 mg | Freq: Once | INTRAMUSCULAR | Status: AC
  Administered 2023-10-11: 1 mg via INTRAVENOUS
  Filled 2023-10-11: qty 1

## 2023-10-11 MED ORDER — SENNOSIDES-DOCUSATE SODIUM 8.6-50 MG PO TABS
2.0000 | ORAL_TABLET | Freq: Every day | ORAL | Status: DC
  Administered 2023-10-11 – 2023-10-16 (×6): 2 via ORAL
  Filled 2023-10-11 (×6): qty 2

## 2023-10-11 MED ORDER — ONDANSETRON HCL 4 MG PO TABS: 4.0000 mg | ORAL_TABLET | Freq: Four times a day (QID) | ORAL | Status: DC | PRN

## 2023-10-11 MED ORDER — PREGABALIN 50 MG PO CAPS
150.0000 mg | ORAL_CAPSULE | Freq: Three times a day (TID) | ORAL | Status: DC
  Administered 2023-10-11 – 2023-10-17 (×18): 150 mg via ORAL
  Filled 2023-10-11: qty 6
  Filled 2023-10-11 (×17): qty 3

## 2023-10-11 MED ORDER — OXYCODONE HCL 5 MG PO TABS
30.0000 mg | ORAL_TABLET | ORAL | Status: DC | PRN
  Administered 2023-10-11 – 2023-10-17 (×8): 30 mg via ORAL
  Filled 2023-10-11 (×9): qty 6

## 2023-10-11 MED ORDER — HYDROMORPHONE HCL 1 MG/ML IJ SOLN: 0.5000 mg | INTRAMUSCULAR | Status: DC | PRN

## 2023-10-11 MED ORDER — ONDANSETRON HCL 4 MG/2ML IJ SOLN
4.0000 mg | Freq: Four times a day (QID) | INTRAMUSCULAR | Status: DC | PRN
  Administered 2023-10-15: 4 mg via INTRAVENOUS
  Filled 2023-10-11 (×2): qty 2

## 2023-10-11 MED ORDER — POTASSIUM CHLORIDE CRYS ER 20 MEQ PO TBCR
40.0000 meq | EXTENDED_RELEASE_TABLET | Freq: Once | ORAL | Status: AC
  Administered 2023-10-11: 40 meq via ORAL
  Filled 2023-10-11: qty 2

## 2023-10-11 MED ORDER — DEXAMETHASONE 4 MG PO TABS
4.0000 mg | ORAL_TABLET | Freq: Three times a day (TID) | ORAL | Status: DC
  Administered 2023-10-12 – 2023-10-17 (×17): 4 mg via ORAL
  Filled 2023-10-11 (×17): qty 1

## 2023-10-11 MED ORDER — METHADONE HCL 5 MG PO TABS
70.0000 mg | ORAL_TABLET | Freq: Every day | ORAL | Status: DC
  Administered 2023-10-12 – 2023-10-17 (×6): 70 mg via ORAL
  Filled 2023-10-11 (×6): qty 14

## 2023-10-11 MED ORDER — KETOROLAC TROMETHAMINE 15 MG/ML IJ SOLN
15.0000 mg | Freq: Once | INTRAMUSCULAR | Status: AC
  Administered 2023-10-11: 15 mg via INTRAVENOUS
  Filled 2023-10-11: qty 1

## 2023-10-11 MED ORDER — HYDROMORPHONE HCL 1 MG/ML IJ SOLN
1.0000 mg | INTRAMUSCULAR | Status: DC | PRN
  Administered 2023-10-13 – 2023-10-17 (×10): 1 mg via INTRAVENOUS
  Filled 2023-10-11 (×10): qty 1

## 2023-10-11 MED ORDER — DEXAMETHASONE SODIUM PHOSPHATE 10 MG/ML IJ SOLN
10.0000 mg | Freq: Once | INTRAMUSCULAR | Status: AC
  Administered 2023-10-11: 10 mg via INTRAVENOUS
  Filled 2023-10-11: qty 1

## 2023-10-11 MED ORDER — PREGABALIN 50 MG PO CAPS: 100.0000 mg | ORAL_CAPSULE | Freq: Three times a day (TID) | ORAL | Status: DC

## 2023-10-11 MED ORDER — ALPRAZOLAM 0.25 MG PO TABS: 0.1250 mg | ORAL_TABLET | Freq: Two times a day (BID) | ORAL | Status: DC | PRN

## 2023-10-11 MED ORDER — CYCLOBENZAPRINE HCL 10 MG PO TABS
10.0000 mg | ORAL_TABLET | Freq: Three times a day (TID) | ORAL | Status: DC | PRN
  Filled 2023-10-11: qty 1

## 2023-10-11 MED ORDER — PANTOPRAZOLE SODIUM 40 MG PO TBEC
40.0000 mg | DELAYED_RELEASE_TABLET | Freq: Every day | ORAL | Status: DC
  Administered 2023-10-12 – 2023-10-17 (×6): 40 mg via ORAL
  Filled 2023-10-11 (×6): qty 1

## 2023-10-11 MED ORDER — ENOXAPARIN SODIUM 40 MG/0.4ML IJ SOSY
40.0000 mg | PREFILLED_SYRINGE | INTRAMUSCULAR | Status: DC
  Administered 2023-10-11 – 2023-10-16 (×6): 40 mg via SUBCUTANEOUS
  Filled 2023-10-11 (×6): qty 0.4

## 2023-10-11 MED ORDER — LORATADINE 10 MG PO TABS
10.0000 mg | ORAL_TABLET | Freq: Every day | ORAL | Status: DC
  Administered 2023-10-12 – 2023-10-16 (×5): 10 mg via ORAL
  Filled 2023-10-11 (×6): qty 1

## 2023-10-11 MED ORDER — ALBUTEROL SULFATE (2.5 MG/3ML) 0.083% IN NEBU: 2.5000 mg | INHALATION_SOLUTION | RESPIRATORY_TRACT | Status: DC | PRN

## 2023-10-11 NOTE — Plan of Care (Signed)

## 2023-10-11 NOTE — Progress Notes (Addendum)
 [facility]  Patient is active with [facility] and discharged home yesterday 7/15. Hospice nurse called patient this AM to schedule to see patient and patient stated she was in pain. Nurse offered to make home visit within an hour and patient refused and called EMS. We will continue to follow.   This pt hospitalization visit is related to terminal diagnosis.   Magdalena Berber, RN [phone]

## 2023-10-11 NOTE — Consult Note (Signed)
 Consultation Note Date: 10/11/2023   Patient Name: Valerie Kerr  DOB: 12-15-1960  MRN: [id number]  Age / Sex: 63 y.o., female  PCP: Delbert Clam, MD Referring Physician: Zella Katha HERO, MD  Reason for Consultation:  continued uncontrolled metastatic cancer pain  HPI/Patient Profile: 64 y.o. 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 She was recently admitted  7/12-7/15 for same issues. She was discharged home with hospice support. Palliative consulted for pain control.   Primary Decision Maker PATIENT  Discussion: Chart reviewed including labs, progress notes, imaging from this and previous encounters.  Lumbar spine CT from 7/12 reviewed- shows bony destruction of S1/S2 with extensive soft tissue tumor extending into the retroperitoneal/presacral space, along the SI joints, along sacral spinal canal and adjacent to the piriformis musculature. The tumor is extending into spinal canal. There was also fracture of the R side L5 vertebral body.  On evaluation patient report pain is currently improved with administration of IV hydromorphone . She has received total dose 2mg  IV hydromorphone  in the last 5 hours.  She describes her pain had rapid onset when she was trying to get up from the toilet. It radiated down the sides of both of her legs and did not stop. Pain was not relieved with her home 30mg  oxycodone  and that is why she came to ED.  Per hospice note- she called hospice and they offered to have nurse to see her within an hour, however, pt declined to wait.  Patient feels that the hydromorphone  works better than oxycodone . We discussed transitioning her po oxycodone  to po hydromorphone . She inquired if she could have both po  oxycodone  and po hydromorphone  together. I advised that we don't prescribe two different breakthrough medications- however we can definitely change her po oxycodone  to po hydromorphone  with equal analgesic dosing.  Attempted to discuss goals of care but she was not interested in discussion at this time.  I explored possible option of avoiding hospital admission and attempting to see if inpatient hospice could take her for pain control, but she declined.  Bowels are moving- she does have history of constipation- last BM was last night.    SUMMARY OF RECOMMENDATIONS -Uncontrolled cancer related pain- hydromorphone  1mg  IV q2hrs prn for breakthrough pain- will review 24 hour needs tomorrow and consider either increasing methadone  or changing oxycodone  to hydromorphone  based on equal analgesic dosing -Continue methadone   70 mg q am, 40mg  in the afternoon and 40mg  at night -She was on dexamethasone  4mg  q8hr during last admission- does not appear this was ordered at discharge- will restart, recommend continuing on discharge -May benefit from IR evaluation for nerve block- I will discuss with her tomorrow -Continue senna 2 po at bedtime for constipation prophylaxis -Increase lyrica  to 150mg  TID po  Code Status/Advance Care Planning:   Code Status: Full Code    Prognosis:   < 6 months  Discharge Planning: To Be Determined  Primary Diagnoses: Present on Admission:  Cancer-related breakthrough pain from Metastatic leiomyosarcoma of the bone /Chronic pain syndrome   Review of Systems  Constitutional:  Positive for activity change.  Gastrointestinal:  Negative for constipation.  Musculoskeletal:  Positive for back pain.    Physical Exam Vitals and nursing note reviewed.  Constitutional:      General: She is not in acute distress.    Appearance: She is ill-appearing.  Cardiovascular:     Rate and Rhythm: Normal rate.  Pulmonary:     Effort: Pulmonary effort is normal.  Neurological:      Mental Status: She is alert and oriented to person, place, and time.     Vital Signs: BP 136/81 (BP Location: Right Arm)   Pulse 80   Temp 98.7 F (37.1 C) (Oral)   Resp 16   Ht 5' 1 (1.549 m)   Wt 72 kg   SpO2 98%   BMI 29.99 kg/m  Pain Scale: 0-10   Pain Score: 10-Worst pain ever   SpO2: SpO2: 98 % O2 Device:SpO2: 98 % O2 Flow Rate: .   IO: Intake/output summary: No intake or output data in the 24 hours ending 10/11/23 1544  LBM:   Baseline Weight: Weight: 72 kg Most recent weight: Weight: 72 kg       Thank you for this consult. Palliative medicine will continue to follow and assist as needed.   Signed by: Cassondra Stain, AGNP-C Palliative Medicine  Time includes:   Preparing to see the patient (e.g., review of tests) Obtaining and/or reviewing separately obtained history Performing a medically necessary appropriate examination and/or evaluation Counseling and educating the patient/family/caregiver Ordering medications, tests, or procedures Referring and communicating with other health care professionals (when not reported separately) Documenting clinical information in the electronic or other health record Independently interpreting results (not reported separately) and communicating results to the patient/family/caregiver Care coordination (not reported separately) Clinical documentation   Please contact Palliative Medicine Team phone at [phone] for questions and concerns.  For individual provider: See Tracey

## 2023-10-11 NOTE — ED Provider Notes (Signed)
 [place] EMERGENCY DEPARTMENT AT [facility] Provider Note   CSN: [id number] Arrival date & time: 10/11/23  1030     Patient presents with: Leg Pain   Valerie Kerr is a 63 y.o. female.  HPI Patient is a 63 year old female presented to the ED today for concerns for breakthrough cancer pain, recently discharge from the hospital yesterday and currently taking oxycodone , methadone , Lyrica  for the pain however states that her bilateral leg pain is worse and uncontrolled today.  Reports that she is in talks with hospice/home care and was supposed to get registered today but was in too much pain and came into the emergency department for pain control.  Denies fever, headache, chest pain, shortness of breath, abdominal pain, nausea, vomiting, saddle paresthesias, urinary incontinence/fecal incontinence, increased numbness/weakness/tingling, lower leg swelling.    Prior to Admission medications   Medication Sig Start Date End Date Taking? Authorizing Provider  ALPRAZolam  (XANAX ) 0.25 MG tablet Take 0.5 tablets (0.125 mg total) by mouth 2 (two) times daily as needed for anxiety. Patient taking differently: Take 0.25 mg by mouth daily as needed for anxiety or sleep. 10/10/23  Yes Will Almarie MATSU, MD  fluticasone  (FLONASE ) 50 MCG/ACT nasal spray INSTILL 1 SPRAY INTO EACH NOSTRIL DAILY Patient taking differently: Place 1 spray into both nostrils daily as needed for allergies or rhinitis. 04/09/19  Yes Newlin, Enobong, MD  ibuprofen  (ADVIL ) 600 MG tablet Take 1 tablet (600 mg total) by mouth every 8 (eight) hours as needed for moderate pain (pain score 4-6) or mild pain (pain score 1-3). 09/25/23  Yes Pickenpack-Cousar, Fannie SAILOR, NP  lidocaine  (LIDODERM ) 5 % Place 1 patch onto the skin daily. Remove & Discard patch within 12 hours or as directed by MD Patient taking differently: Place 1 patch onto the skin daily as needed (for pain- Remove & Discard patch within 12 hours or as directed  by MD). 08/07/23  Yes Pickenpack-Cousar, Fannie SAILOR, NP  Menthol , Topical Analgesic, (BIOFREEZE) 10 % CREA Apply topically 3 (three) times daily as needed. Patient taking differently: Apply 1 Application topically 3 (three) times daily as needed (for pain). 08/07/23  Yes Pickenpack-Cousar, Fannie SAILOR, NP  methadone  (DOLOPHINE ) 10 MG tablet Take 7 tablets (70 mg) in the morning, 4 tablets (40 mg) in the afternoon and 4 tablets (40 mg) in the evening Patient taking differently: Take 40-70 mg by mouth See admin instructions. Take 70 mg by mouth in the morning and 40 mg in the afternoon & evening 10/10/23  Yes Will Almarie MATSU, MD  omeprazole  (PRILOSEC) 20 MG capsule Take 1 capsule (20 mg total) by mouth daily. Must have office visit for refills Patient taking differently: Take 20 mg by mouth daily before breakfast. 05/02/19  Yes Newlin, Enobong, MD  ondansetron  (ZOFRAN ) 4 MG tablet Take 1 tablet (4 mg total) by mouth every 6 (six) hours as needed for nausea. 10/10/23  Yes Will Almarie MATSU, MD  OVER THE COUNTER MEDICATION Take 1 tablet by mouth See admin instructions. Equate Severe Sinus Congestions & Pain Caplets- Take 1 caplet by mouth every four hours as needed for sinus pain   Yes [provider]  oxycodone  (ROXICODONE ) 30 MG immediate release tablet Take 1 tablet (30 mg total) by mouth every 4 (four) hours as needed. Patient taking differently: Take 30 mg by mouth every 4 (four) hours. 10/10/23  Yes Will Almarie MATSU, MD  pregabalin  (LYRICA ) 100 MG capsule Take 1 capsule (100 mg total) by mouth 2 (two) times daily.  Patient taking differently: Take 100 mg by mouth 3 (three) times daily. 09/28/23  Yes Pickenpack-Cousar, Fannie SAILOR, NP  senna-docusate (SENOKOT-S) 8.6-50 MG tablet Take 2 tablets by mouth at bedtime. 10/10/23  Yes Will Almarie MATSU, MD  calcium -vitamin D  RONNETTE WITH D) 500-5 MG-MCG tablet Take 1 tablet by mouth 2 (two) times daily with a meal. Patient not taking: Reported on  10/11/2023 06/19/23   Vann, Jessica U, DO  cetirizine  (ZYRTEC ) 10 MG tablet TAKE 1 TABLET BY MOUTH EVERY DAY Patient not taking: Reported on 10/11/2023 04/09/19   Newlin, Enobong, MD  cyclobenzaprine  (FLEXERIL ) 10 MG tablet Take 1 tablet (10 mg total) by mouth 3 (three) times daily as needed for muscle spasms. Patient not taking: Reported on 10/11/2023 10/10/23   Will Almarie MATSU, MD    Allergies: Flexeril  [cyclobenzaprine ], Codeine, Fentanyl , and Tramadol    Review of Systems  Musculoskeletal:  Positive for arthralgias, back pain and myalgias.  All other systems reviewed and are negative.   Updated Vital Signs BP 136/81 (BP Location: Right Arm)   Pulse 80   Temp 98.7 F (37.1 C) (Oral)   Resp 16   Ht 5' 1 (1.549 m)   Wt 72 kg   SpO2 98%   BMI 29.99 kg/m   Physical Exam Vitals and nursing note reviewed.  Constitutional:      General: She is not in acute distress.    Appearance: Normal appearance. She is not ill-appearing or diaphoretic.  HENT:     Head: Normocephalic and atraumatic.  Eyes:     General:        Right eye: No discharge.        Left eye: No discharge.     Extraocular Movements: Extraocular movements intact.     Conjunctiva/sclera: Conjunctivae normal.  Cardiovascular:     Rate and Rhythm: Normal rate and regular rhythm.     Pulses: Normal pulses.     Heart sounds: Normal heart sounds. No murmur heard.    No friction rub. No gallop.  Pulmonary:     Effort: Pulmonary effort is normal. No respiratory distress.     Breath sounds: Normal breath sounds. No stridor. No wheezing, rhonchi or rales.  Abdominal:     General: Abdomen is flat. There is no distension.     Palpations: Abdomen is soft.     Tenderness: There is no abdominal tenderness. There is no guarding.  Musculoskeletal:        General: Tenderness (Notably tender over both of the anterior aspects of her thighs bilaterally as well as mildly tender to her lumbar spine) present. No deformity or signs  of injury.     Cervical back: Normal range of motion and neck supple. No rigidity.     Right lower leg: No edema.     Left lower leg: No edema.  Skin:    General: Skin is warm and dry.     Findings: No bruising or erythema.  Neurological:     General: No focal deficit present.     Mental Status: She is alert and oriented to person, place, and time. Mental status is at baseline.     Sensory: No sensory deficit.     Motor: No weakness.  Psychiatric:        Mood and Affect: Mood normal.     (all labs ordered are listed, but only abnormal results are displayed) Labs Reviewed  CBC WITH DIFFERENTIAL/PLATELET - Abnormal; Notable for the following components:  Result Value   Hemoglobin 11.6 (*)    RDW 16.2 (*)    Abs Immature Granulocytes 0.08 (*)    All other components within normal limits  COMPREHENSIVE METABOLIC PANEL WITH GFR - Abnormal; Notable for the following components:   Potassium 3.0 (*)    BUN 24 (*)    Calcium  7.9 (*)    Albumin 2.9 (*)    AST 44 (*)    ALT 67 (*)    All other components within normal limits    EKG: None  Radiology: No results found.   Procedures   Medications Ordered in the ED  potassium chloride  SA (KLOR-CON  M) CR tablet 40 mEq (has no administration in time range)  methadone  (DOLOPHINE ) tablet 10 mg (has no administration in time range)  oxyCODONE  (Oxy IR/ROXICODONE ) immediate release tablet 30 mg (has no administration in time range)  ALPRAZolam  (XANAX ) tablet 0.125 mg (has no administration in time range)  pantoprazole  (PROTONIX ) EC tablet 40 mg (has no administration in time range)  senna-docusate (Senokot-S) tablet 2 tablet (has no administration in time range)  cyclobenzaprine  (FLEXERIL ) tablet 10 mg (has no administration in time range)  loratadine  (CLARITIN ) tablet 10 mg (has no administration in time range)  enoxaparin  (LOVENOX ) injection 40 mg (has no administration in time range)  HYDROmorphone  (DILAUDID ) injection 0.5-1 mg  (has no administration in time range)  ondansetron  (ZOFRAN ) tablet 4 mg (has no administration in time range)    Or  ondansetron  (ZOFRAN ) injection 4 mg (has no administration in time range)  albuterol  (PROVENTIL ) (2.5 MG/3ML) 0.083% nebulizer solution 2.5 mg (has no administration in time range)  pregabalin  (LYRICA ) capsule 150 mg (has no administration in time range)  dexamethasone  (DECADRON ) tablet 4 mg (has no administration in time range)  HYDROmorphone  (DILAUDID ) injection 1 mg (1 mg Intravenous Given 10/11/23 1116)  HYDROmorphone  (DILAUDID ) injection 1 mg (1 mg Intravenous Given 10/11/23 1310)  ketorolac  (TORADOL ) 15 MG/ML injection 15 mg (15 mg Intravenous Given 10/11/23 1318)  dexamethasone  (DECADRON ) injection 10 mg (10 mg Intravenous Given 10/11/23 1318)                                  Medical Decision Making Amount and/or Complexity of Data Reviewed Labs: ordered. ECG/medicine tests: ordered.  Risk Prescription drug management. Decision regarding hospitalization.   This patient is a 63 year old female who presents to the ED for concern of breakthrough pain From metastatic cancer notably discharged yesterday for same.  Currently on Lyrica , oxycodone , methadone  and was supposed to talk with hospice care today to get registered however was in too much pain and came to the ED today.  Had spoke with palliative care when first admitted.  On physical exam, patient is in no acute distress, afebrile, alert and orient x 4, speaking in full sentences, nontachypneic, nontachycardic.  Notably is having increased tenderness to the anterior aspect of her thighs bilaterally, similar to previous pain that she had when admitted previously.  Mild low back pain to palpation.  However this is unchanged from previous.  Is able to move all extremities grossly and has good sensation.  Unremarkable exam otherwise.  With patient recurrent pain, will obtain baseline labs, no need to repeat imaging as  recent imaging was done.  Metastatic disease already confirmed.  Provided Dilaudid , Decadron , and Toradol .  On reevaluation, patient was still in pain requiring second doses of Dilaudid .  On reevaluation she is still  in pain and will seek to admit for pain control.    Spoke with Dr. Zella, hospitalist who excepted care of patient at this time.  Consult pending to palliative care.   Differential diagnoses prior to evaluation: The emergent differential diagnosis includes, but is not limited to, metastatic disease, ischemic limb,. This is not an exhaustive differential.   Past Medical History / Co-morbidities / Social History: Leiomyosarcoma , HTN, hypomagnesia, anxiety, lumbar herniated disc, GERD, chronic pain  Additional history: Chart reviewed. Pertinent results include:   Last admitted to the hospital on 10/07/2018 25 through 7/15 for cancer associated pain noting to have metastatic leiomyosarcoma of the bone.  Noted to have lost her ability to walk but this was noted to be chronic at the time and complaining of worsening leg pain and back pain.  Denies urinary incontinence or saddle paresthesias at that time.  CT of lumbar spine was done at that time.  Showing progressive bony distraction of S1 and S2 and extensive soft tissue tumor in the vicinity extending into the retroperitoneal/presacral space. Not a candidate for chemotherapy.  Not a surgical candidate.  Recommending hospice and comfort care.  Controlled at that time with 70 mg of methadone  in the morning, 40 mg in the afternoon and evening.  Oxycodone  30 mg every 4 hours as needed.  Lyrica  100 mg twice daily and Dilaudid  as needed.   Lab Tests/Imaging studies: I personally interpreted labs/imaging and the pertinent results include:   CBC notes mild anemia with hemoglobin 1.6 BMP shows a hypokalemia 3.0 Hypocalcemia of 7.9 Elevated AST ALT   Medications: I ordered medication including Dilaudid , Toradol , Reglan .  I have  reviewed the patients home medicines and have made adjustments as needed.  Critical Interventions: None  Social Determinants of Health: Notably is talked to both hospice care and palliative care and is in discussions for being established with them.  Disposition: After consideration of the diagnostic results and the patients response to treatment, I feel that the patient would benefit from admission for pain control, followed by palliative care and patient care transferred over to Dr. Zella, hospitalist with palliative care consult pending.     Final diagnoses:  Cancer-related breakthrough pain  Hypokalemia  Hypocalcemia    ED Discharge Orders     None          Beola Terrall GORMAN DEVONNA 10/11/23 1543    Garrick Charleston, MD 10/12/23 1215

## 2023-10-11 NOTE — H&P (Signed)
 History and Physical  Valerie Kerr FMW:[id number] DOB: 1961-01-17 DOA: 10/11/2023  PCP: Delbert Clam, MD   Chief Complaint: Uncontrolled pain  HPI: Valerie Kerr is a 63 y.o. 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  She has known progressive bony destruction of the S1 and S2 segments of the sacrum with extensive soft tissue tumor in this area, with associated severe bilateral hip/buttock/lower extremity pain.  She has no saddle anesthesia or other alarm symptoms, denies any fever, nausea, vomiting or other new concerns.  States that while she was in the hospital, her pain was at best 5 out of 10.  During her prior hospitalization, she was seen by oncology as well as palliative care.  Oncology notes indicate that she is no longer a candidate for any further systemic chemotherapy, and is not a surgical candidate.  Recommendation is for comfort care, but patient is noted to have poor under setting of her disease process.  Unfortunately she states that she had severe pain 10 out of 10, worse than any pain she has had in the past, it is in the same location that it has been, there is nothing new about it other than the severity.  Review of Systems: Please see HPI for pertinent positives and negatives. A complete 10 system review of systems are otherwise negative.  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---  treatment concurrent chemo (week 6 on hold due to pancytopenia) and radiation therapy (external beam 04-12-2016 to 05-31-2016)  started high-dose rate brachytherapy 05-26-2016   Chemotherapy-induced  thrombocytopenia    Chronic pain disorder    back,neck-- chronic methadone    Environmental allergies    allergy to dust mites   GERD (gastroesophageal reflux disease)    Headache    Herniated disc, cervical    History of external beam radiation therapy 04/12/16-05/31/16   pelvis 45 Gy in 25 fractions, in  30 sessions, pelvis boost 9 Gy in 5 fractions   History of radiation therapy 06/20/16-07/04/16   tandem and ring applicator to cervix 28 Gy in 5 fractions   History of radiation therapy    Pelvis(sacrum) 05/18/23-06/16/23-Dr. Lynwood shannon   Hypertension    Hypokalemia    Hypomagnesemia    po supplement and IV replacement   Leukopenia due to antineoplastic chemotherapy (HCC)    Lumbar herniated disc    Pancytopenia due to chemotherapy (HCC)    Periodontitis    Past Surgical History:  Procedure Laterality Date   ABCESS DRAINAGE Left 1982   breast   CARPAL TUNNEL RELEASE Bilateral    CERVICAL DISC SURGERY  2010   MULTIPLE EXTRACTIONS WITH ALVEOLOPLASTY N/A 04/08/2016   Procedure: Extraction of tooth #'s 6,0,78,69,68 with alveoloplasty  and gross debridement of remaining teeth;  Surgeon: Tanda JULIANNA Fanny, DDS;  Location: [facility] OR;  Service: Oral Surgery;  Laterality: N/A;   TANDEM RING INSERTION N/A 05/26/2016   Procedure: TANDEM RING INSERTION;  Surgeon: Lynwood shannon, MD;  Location: [facility];  Service: Urology;  Laterality: N/A;   TANDEM RING INSERTION N/A 06/02/2016   Procedure: TANDEM RING INSERTION;  Surgeon: Lynwood shannon, MD;  Location: [facility];  Service: Urology;  Laterality: N/A;   TANDEM RING INSERTION N/A 06/20/2016   Procedure: TANDEM RING INSERTION;  Surgeon: Lynwood Nasuti, MD;  Location: [facility];  Service: Urology;  Laterality: N/A;   TANDEM RING INSERTION N/A 06/23/2016   Procedure: TANDEM RING INSERTION;  Surgeon: Lynwood Nasuti, MD;  Location: [facility];  Service: Urology;  Laterality: N/A;   TANDEM RING  INSERTION N/A 07/04/2016   Procedure: TANDEM RING INSERTION;  Surgeon: Lynwood Nasuti, MD;  Location: [facility];  Service: Urology;  Laterality: N/A;   Social History:  reports that she has been smoking cigarettes. She has a 9 pack-year smoking history. She has never used smokeless tobacco. She reports that she does not drink alcohol and does not use drugs.  Allergies  Allergen Reactions   Codeine Itching and Rash   Fentanyl  Rash    Skin rash, local swelling from patch   Hydrocodone -Acetaminophen  Rash   Tramadol Other (See Comments)    GI upset,also trembling sensation    Family History  Problem Relation Age of Onset   Cancer Father    Cancer Sister        throat   Cancer Brother        throat and stomach   Cancer Brother        lung     Prior to Admission medications   Medication Sig Start Date End Date Taking? Authorizing Provider  ALPRAZolam  (XANAX ) 0.25 MG tablet Take 0.5 tablets (0.125 mg total) by mouth 2 (two) times daily as needed for anxiety. 10/10/23   Will Almarie MATSU, MD  calcium -vitamin D  (OSCAL WITH D) 500-5 MG-MCG tablet Take 1 tablet by mouth 2 (two) times daily with a meal. 06/19/23   Vann, Jessica U, DO  cetirizine  (ZYRTEC ) 10 MG tablet TAKE 1 TABLET BY MOUTH EVERY DAY Patient taking differently: Take 10 mg by mouth daily. 04/09/19   Newlin, Enobong, MD  cyclobenzaprine  (FLEXERIL ) 10 MG tablet Take 1 tablet (10 mg total) by mouth 3 (three) times daily as needed for muscle spasms. 10/10/23   Will Almarie MATSU, MD  fluticasone  (FLONASE ) 50 MCG/ACT nasal spray INSTILL 1 SPRAY INTO EACH NOSTRIL DAILY Patient taking differently: Place 1 spray into both nostrils daily. 04/09/19   Newlin, Enobong, MD  ibuprofen  (ADVIL ) 600 MG tablet Take 1 tablet (600 mg total) by mouth every 8 (eight) hours as needed for moderate pain (pain score 4-6) or mild pain (pain score 1-3). 09/25/23   Pickenpack-Cousar, Fannie SAILOR, NP  lidocaine  (LIDODERM ) 5 % Place 1 patch onto the  skin daily. Remove & Discard patch within 12 hours or as directed by MD Patient taking differently: Place 1 patch onto the skin daily as needed (for pain). 08/07/23   Pickenpack-Cousar, Athena N, NP  Menthol , Topical Analgesic, (BIOFREEZE) 10 % CREA Apply topically 3 (three) times daily as needed. Patient taking differently: Apply 1 Application topically 3 (three) times daily as needed (for pain). 08/07/23   Pickenpack-Cousar, Fannie SAILOR, NP  methadone  (DOLOPHINE ) 10 MG tablet Take 7 tablets (70 mg) in the morning, 4 tablets (40 mg) in the afternoon and 4 tablets (40 mg) in the evening 10/10/23   Will Almarie MATSU, MD  omeprazole  (PRILOSEC) 20 MG capsule Take 1 capsule (20 mg total) by mouth daily. Must have office visit for refills 05/02/19   Delbert Clam, MD  ondansetron  (ZOFRAN ) 4 MG tablet Take 1 tablet (4 mg total) by mouth every 6 (six) hours as needed for  nausea. 10/10/23   Will Almarie MATSU, MD  oxycodone  (ROXICODONE ) 30 MG immediate release tablet Take 1 tablet (30 mg total) by mouth every 4 (four) hours as needed. 10/10/23   Will Almarie MATSU, MD  pregabalin  (LYRICA ) 100 MG capsule Take 1 capsule (100 mg total) by mouth 2 (two) times daily. 09/28/23   Pickenpack-Cousar, Fannie SAILOR, NP  senna-docusate (SENOKOT-S) 8.6-50 MG tablet Take 2 tablets by mouth at bedtime. 10/10/23   Will Almarie MATSU, MD    Physical Exam: BP 136/81 (BP Location: Right Arm)   Pulse 80   Temp 98.7 F (37.1 C) (Oral)   Resp 16   Ht 5' 1 (1.549 m)   Wt 72 kg   SpO2 98%   BMI 29.99 kg/m  General:  Alert, oriented, calm, in no acute distress  Eyes: EOMI, clear conjuctivae, white sclerea Neck: supple, no masses, trachea mildline  Cardiovascular: RRR, no murmurs or rubs, no peripheral edema  Respiratory: clear to auscultation bilaterally, no wheezes, no crackles  Abdomen: soft, nontender, nondistended, normal bowel tones heard  Skin: dry, no rashes  Musculoskeletal: no joint effusions, normal range of  motion  Psychiatric: appropriate affect, normal speech  Neurologic: extraocular muscles intact, clear speech, moving all extremities with intact sensorium         Labs on Admission:  Basic Metabolic Panel: Recent Labs  Lab 10/07/23 1654 10/07/23 2310 10/08/23 0834 10/11/23 1238  NA 139  --  138 142  K 3.3*  --  4.9 3.0*  CL 100  --  102 104  CO2 28  --  26 29  GLUCOSE 77  --  193* 79  BUN 13  --  17 24*  CREATININE 0.90  --  0.94 0.79  CALCIUM  9.1  --  9.2 7.9*  MG  --  1.9  --   --    Liver Function Tests: Recent Labs  Lab 10/07/23 1654 10/11/23 1238  AST 13* 44*  ALT 12 67*  ALKPHOS 67 64  BILITOT 0.5 0.3  PROT 6.9 6.6  ALBUMIN 3.0* 2.9*   No results for input(s): LIPASE, AMYLASE in the last 168 hours. No results for input(s): AMMONIA in the last 168 hours. CBC: Recent Labs  Lab 10/07/23 1654 10/11/23 1238  WBC 8.2 8.8  NEUTROABS  --  6.8  HGB 11.2* 11.6*  HCT 36.3 38.5  MCV 91.0 91.9  PLT 362 292   Cardiac Enzymes: No results for input(s): CKTOTAL, CKMB, CKMBINDEX, TROPONINI in the last 168 hours. BNP (last 3 results) No results for input(s): BNP in the last 8760 hours.  ProBNP (last 3 results) No results for input(s): PROBNP in the last 8760 hours.  CBG: No results for input(s): GLUCAP in the last 168 hours.  Radiological Exams on Admission: No results found. Assessment/Plan Valerie Kerr is a 63 y.o. 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  Severe uncontrolled cancer-related pain-due to known S1 and S2 destruction with soft tissue mass.  Unfortunately she is not a candidate for further chemotherapy, and is not a surgical candidate either.  She has been followed recently by  palliative care as well as oncology, who are recommending comfort care. -Observation admission -Continue pain control with Lyrica , methadone  3 times daily, Flexeril  -Xanax  twice daily for anxiety -IV Dilaudid   for breakthrough pain -Palliative care for symptom management as well as goals of care discussion  GERD-Protonix   Hypokalemia-repleted orally, recheck with morning labs  DVT prophylaxis: Lovenox      Code Status: Full Code  Consults called: Palliative care  Admission status: Observation  Time spent: 48 minutes  Valerie Kerr CHRISTELLA Gail MD [facility] Pager [phone]  If 7PM-7AM, please contact night-coverage [email or web address] Password [facility]  10/11/2023, 2:23 PM

## 2023-10-11 NOTE — ED Triage Notes (Addendum)
 Patient BIB [facility] from home. History of bone cancer. Has pain in both of her legs. Pain worsened this morning. Right leg hurts more than left. Took 30mg  oxycodone  this morning. Not doing chemo/radiation anymore. Said my condition is deteriorating.   EMS 200mcg fentanyl  20G left forearm

## 2023-10-12 DIAGNOSIS — C499 Malignant neoplasm of connective and soft tissue, unspecified: Secondary | ICD-10-CM | POA: Diagnosis not present

## 2023-10-12 DIAGNOSIS — E876 Hypokalemia: Secondary | ICD-10-CM | POA: Diagnosis not present

## 2023-10-12 DIAGNOSIS — C7949 Secondary malignant neoplasm of other parts of nervous system: Secondary | ICD-10-CM | POA: Diagnosis not present

## 2023-10-12 DIAGNOSIS — Z515 Encounter for palliative care: Secondary | ICD-10-CM | POA: Diagnosis not present

## 2023-10-12 DIAGNOSIS — G893 Neoplasm related pain (acute) (chronic): Secondary | ICD-10-CM | POA: Diagnosis not present

## 2023-10-12 LAB — BASIC METABOLIC PANEL WITH GFR
Anion gap: 8 (ref 5–15)
BUN: 28 mg/dL — ABNORMAL HIGH (ref 8–23)
CO2: 30 mmol/L (ref 22–32)
Calcium: 9 mg/dL (ref 8.9–10.3)
Chloride: 102 mmol/L (ref 98–111)
Creatinine, Ser: 0.93 mg/dL (ref 0.44–1.00)
GFR, Estimated: >60 mL/min (ref >60)
Glucose, Bld: 105 mg/dL — ABNORMAL HIGH (ref 70–99)
Potassium: 4.2 mmol/L (ref 3.5–5.1)
Sodium: 140 mmol/L (ref 135–145)

## 2023-10-12 LAB — CBC
HCT: 38.5 % (ref 36.0–46.0)
Hemoglobin: 11.4 g/dL — ABNORMAL LOW (ref 12.0–15.0)
MCH: 27.8 pg (ref 26.0–34.0)
MCHC: 29.6 g/dL — ABNORMAL LOW (ref 30.0–36.0)
MCV: 93.9 fL (ref 80.0–100.0)
Platelets: 282 K/uL (ref 150–400)
RBC: 4.1 MIL/uL (ref 3.87–5.11)
RDW: 16.5 % — ABNORMAL HIGH (ref 11.5–15.5)
WBC: 6.9 K/uL (ref 4.0–10.5)
nRBC: 0 % (ref 0.0–0.2)

## 2023-10-12 MED ORDER — POLYETHYLENE GLYCOL 3350 17 G PO PACK
17.0000 g | PACK | Freq: Two times a day (BID) | ORAL | Status: DC
  Administered 2023-10-13 – 2023-10-17 (×8): 17 g via ORAL
  Filled 2023-10-12 (×11): qty 1

## 2023-10-12 NOTE — Plan of Care (Signed)

## 2023-10-12 NOTE — Care Management Obs Status (Signed)
 MEDICARE OBSERVATION STATUS NOTIFICATION   Patient Details  Name: Valerie Kerr MRN: [id number] Date of Birth: 07/23/1960   Medicare Observation Status Notification Given:  Yes    Doneta Glenys DASEN, RN 10/12/2023, 11:45 AM

## 2023-10-12 NOTE — Progress Notes (Signed)
 Daily Progress Note   Patient Name: Valerie Kerr       Date: 10/12/2023 DOB: 1960-06-09  Age: 63 y.o. MRN#: [id number] Attending Physician: Cherlyn Labella, MD Primary Care Physician: Delbert Clam, MD Admit Date: 10/11/2023  Reason for Consultation/Follow-up: Establishing goals of care  Patient Profile/HPI:   63 y.o. 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 She was recently admitted  7/12-7/15 for same issues. She was discharged home with hospice support. Palliative consulted for pain control.    Subjective: Chart reviewed including labs, progress notes, imaging from this and previous encounters.  No IV hydromorphone  taken overnight. One dose oxycodone  IR this morning.  Jacqulyn reports she slept well and pain is controlled with oxycodone  this morning. She feels the dexamethasone  has also been helpful. She was having some pain when I saw her- I reminded her to call and ask for as needed medication when she has pain.  We discussed being evaluated by IR for possible nerve block. She is agreeable.  Her goc at this time are to return home with hospice support.    Review of Systems  Musculoskeletal:  Positive for back pain.     Physical Exam Vitals and nursing note reviewed.  Constitutional:      General: She is not in acute distress. Cardiovascular:     Rate and Rhythm: Normal rate.  Pulmonary:     Effort: Pulmonary effort is normal.  Neurological:     Mental Status: She is alert and oriented to person, place, and time.             Vital Signs: BP 132/75 (BP Location: Right Arm)   Pulse 88   Temp 97.7 F  (36.5 C)   Resp 20   Ht 5' 1 (1.549 m)   Wt 69.5 kg   SpO2 98%   BMI 28.95 kg/m  SpO2: SpO2: 98 % O2 Device: O2 Device: Room Air O2 Flow Rate:    Intake/output summary:  Intake/Output Summary (Last 24 hours) at 10/12/2023 1136 Last data filed at 10/11/2023 1957 Gross per 24 hour  Intake --  Output 600 ml  Net -600 ml   LBM:   Baseline Weight:  Weight: 72 kg Most recent weight: Weight: 69.5 kg       Palliative Assessment/Data: PPS: 40%      Patient Active Problem List   Diagnosis Date Noted   High risk medication use 06/09/2023   Drug-induced constipation 06/09/2023   Counseling and coordination of care 06/09/2023   Medication management 06/09/2023   Palliative care encounter 06/09/2023   Malignant neoplasm metastatic to bone (HCC) 06/09/2023   Metastatic leiomyosarcoma of bone (HCC) 06/08/2023   Chronic pain syndrome 06/08/2023   Generalized anxiety disorder 06/08/2023   Cancer-related breakthrough pain from Metastatic leiomyosarcoma of the bone /Chronic pain syndrome 06/08/2023   Medical non-compliance 05/26/2023   Leiomyosarcoma (HCC) 05/16/2023   Cancer associated pain 05/16/2023   Goals of care, counseling/discussion 05/16/2023   Physical debility 05/16/2023   Spinal stenosis 01/18/2017   Insomnia 01/18/2017   Lumbar herniated disc 09/05/2016   Cervical radiculopathy 09/05/2016   Neuropathy 09/05/2016   Depression 08/15/2016   Inguinal lymphadenopathy 06/30/2016   Thrombocytopenia (HCC) 05/24/2016   Dehydration 05/24/2016   Anxiety 05/24/2016   Nausea and vomiting 05/17/2016   Leukopenia due to antineoplastic chemotherapy (HCC) 05/12/2016   Hypomagnesemia 05/12/2016   Hypokalemia 05/12/2016   Anemia due to antineoplastic chemotherapy 05/04/2016   Continuous tobacco abuse 04/01/2016   Poor dentition 04/01/2016   Essential hypertension 04/01/2016   Methadone  use 04/01/2016   Allergic sinusitis 04/01/2016   GERD (gastroesophageal reflux disease)  04/01/2016   Cervical cancer (HCC) 03/03/2016    Palliative Care Assessment & Plan    Assessment/Recommendations/Plan -Uncontrolled cancer related pain- continue hydromorphone  1mg  IV q2hrs prn for breakthrough pain -Continue methadone   70 mg q am, 40mg  in the afternoon and 40mg  at night -Continue dexamethasone  4mg  q8hr- does not appear this was ordered at discharge- will restart, recommend continuing on discharge -Continue oxycodone  30mg  po q4hrs prn  -IR evaluation for nerve block  -Continue senna 2 po at bedtime for constipation prophylaxis -Continue lyrica  to 150mg  TID po  Code Status:   Code Status: Full Code   Prognosis:  < 6 months  Discharge Planning: Home with Hospice  Care plan was discussed with patient and attending MD.   Thank you for allowing the Palliative Medicine Team to assist in the care of this patient.  Total time:  Prolonged billing:  Time includes:   Preparing to see the patient (e.g., review of tests) Obtaining and/or reviewing separately obtained history Performing a medically necessary appropriate examination and/or evaluation Counseling and educating the patient/family/caregiver Ordering medications, tests, or procedures Referring and communicating with other health care professionals (when not reported separately) Documenting clinical information in the electronic or other health record Independently interpreting results (not reported separately) and communicating results to the patient/family/caregiver Care coordination (not reported separately) Clinical documentation  Cassondra Stain, AGNP-C Palliative Medicine   Please contact Palliative Medicine Team phone at [phone] for questions and concerns.

## 2023-10-12 NOTE — Progress Notes (Signed)
  IR BRIEF PROGRESS NOTE:  IR was requested for possible nerve block on this patient to relieve intractable bilateral sciatic pain, secondary to extensive sacral/pelvic leiomyosarcoma metastasis. IR attending, Dr. Philip, has reviewed the request. Unfortunately, the extent of the metastatic involvement at the sacrum will preclude any benefit to patient from possible sciatic or other nerve block. IR recommends a consult to Anesthesia for any possible benefit from their service to the patient. IR will sign off at this time. Please re-consult IR for any further requests. IR remains available.    Electronically Signed: Carlin DELENA Griffon, PA-C 10/12/2023, 3:30 PM

## 2023-10-12 NOTE — TOC Initial Note (Signed)
 Transition of Care [facility]) - Initial/Assessment Note    Patient Details  Name: Valerie Kerr MRN: [id number] Date of Birth: 1960-07-26  [facility] CM/SW Contact:    Doneta Glenys DASEN, RN Phone Number: 10/12/2023, 1:15 PM  Clinical Narrative:                 MOON Completed. Presented for pain. CM met with patient in the room. Patient is with [facility].  Expected Discharge Plan: Home w Hospice Care Barriers to Discharge: Continued Medical Work up   Patient Goals and CMS Choice Patient states their goals for this hospitalization and ongoing recovery are:: Home with Hospice CMS Medicare.gov Compare Post Acute Care list provided to::  (NA) Choice offered to / list presented to : NA [place] ownership interest in [facility].provided to:: Parent NA    Expected Discharge Plan and Services In-house Referral: NA Discharge Planning Services: CM Consult Post Acute Care Choice: NA Living arrangements for the past 2 months: Apartment                 DME Arranged: N/A DME Agency: NA       HH Arranged: NA HH Agency: NA        Prior Living Arrangements/Services Living arrangements for the past 2 months: Apartment Lives with:: Adult Children Patient language and need for interpreter reviewed:: Yes Do you feel safe going back to the place where you live?: Yes      Need for Family Participation in Patient Care: Yes (Comment) Care giver support system in place?: Yes (comment) Current home services: Hospice, DME (Hospice equipment Package C) Criminal Activity/Legal Involvement Pertinent to Current Situation/Hospitalization: No - Comment as needed  Activities of Daily Living   ADL Screening (condition at time of admission) Independently performs ADLs?: No Does the patient have a NEW difficulty with bathing/dressing/toileting/self-feeding that is expected to last >3 days?: No Does the patient have a NEW difficulty with getting in/out of bed,  walking, or climbing stairs that is expected to last >3 days?: Yes (Initiates electronic notice to provider for possible PT consult) Does the patient have a NEW difficulty with communication that is expected to last >3 days?: No Is the patient deaf or have difficulty hearing?: No Does the patient have difficulty seeing, even when wearing glasses/contacts?: No Does the patient have difficulty concentrating, remembering, or making decisions?: No  Permission Sought/Granted Permission sought to share information with : Case Manager Permission granted to share information with : Yes, Verbal Permission Granted  Share Information with NAME: Emmaline Hillier (Daughter)  [phone] (Mobile)           Emotional Assessment Appearance:: Appears stated age Attitude/Demeanor/Rapport: Engaged Affect (typically observed): Appropriate Orientation: : Oriented to Self, Oriented to Place, Oriented to  Time, Oriented to Situation   Psych Involvement: No (comment)  Admission diagnosis:  Hypocalcemia [E83.51] Hypokalemia [E87.6] Cancer-related breakthrough pain [G89.3] Patient Active Problem List   Diagnosis Date Noted   High risk medication use 06/09/2023   Drug-induced constipation 06/09/2023   Counseling and coordination of care 06/09/2023   Medication management 06/09/2023   Palliative care encounter 06/09/2023   Malignant neoplasm metastatic to bone (HCC) 06/09/2023   Metastatic leiomyosarcoma of bone (HCC) 06/08/2023   Chronic pain syndrome 06/08/2023   Generalized anxiety disorder 06/08/2023   Cancer-related breakthrough pain from Metastatic leiomyosarcoma of the bone /Chronic pain syndrome 06/08/2023   Medical non-compliance 05/26/2023   Leiomyosarcoma (HCC) 05/16/2023   Cancer associated pain 05/16/2023  Goals of care, counseling/discussion 05/16/2023   Physical debility 05/16/2023   Spinal stenosis 01/18/2017   Insomnia 01/18/2017   Lumbar herniated disc 09/05/2016   Cervical  radiculopathy 09/05/2016   Neuropathy 09/05/2016   Depression 08/15/2016   Inguinal lymphadenopathy 06/30/2016   Thrombocytopenia (HCC) 05/24/2016   Dehydration 05/24/2016   Anxiety 05/24/2016   Nausea and vomiting 05/17/2016   Leukopenia due to antineoplastic chemotherapy (HCC) 05/12/2016   Hypomagnesemia 05/12/2016   Hypokalemia 05/12/2016   Anemia due to antineoplastic chemotherapy 05/04/2016   Continuous tobacco abuse 04/01/2016   Poor dentition 04/01/2016   Essential hypertension 04/01/2016   Methadone  use 04/01/2016   Allergic sinusitis 04/01/2016   GERD (gastroesophageal reflux disease) 04/01/2016   Cervical cancer (HCC) 03/03/2016   PCP:  Delbert Clam, MD Pharmacy:   [facility] [address] [place] [address] Phone: [phone] Fax: [phone]     Social Drivers of Health (SDOH) Social History: SDOH Screenings   Food Insecurity: No Food Insecurity (10/11/2023)  Housing: Low Risk  (10/11/2023)  Transportation Needs: No Transportation Needs (10/11/2023)  Utilities: Not At Risk (10/11/2023)  Depression (PHQ2-9): Low Risk  (04/13/2023)  Social Connections: Feeling Socially Integrated (04/19/2023)   Received from [facility]  Tobacco Use: High Risk (10/11/2023)  Health Literacy: Adequate Health Literacy (04/19/2023)   Received from [facility]   SDOH Interventions:     Readmission Risk Interventions     No data to display

## 2023-10-12 NOTE — Progress Notes (Signed)
 [facility]                                                                               Valerie Kerr, is a 63 y.o. female, DOB - February 20, 1961, FMW:[id number] Admit date - 10/11/2023    Outpatient Primary MD for the patient is Newlin, Enobong, MD  LOS - 0  days    Brief summary   Valerie Kerr is a 63 y.o. 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  She has known progressive bony destruction of the S1 and S2 segments of the sacrum with extensive soft tissue tumor in this area, with associated severe bilateral hip/buttock/lower extremity pain.  Oncology notes indicate that she is no longer a candidate for any further systemic chemotherapy, and is not a surgical candidate. Recommendation is for comfort care, but patient is noted to have poor understanding of her disease process.   Palliative care consulted for pain management and symptom control  Assessment & Plan    Assessment and Plan:  Caner related pain, uncontrolled due to S1 S2 destruction  Not a candidate for any treatment at this time.  Oncology recommending comfort care.  Pain control with methadone  , Dilaudid , decadron , lyrica .  Palliative on board for pain management.  Bowel regimen   Stage 4 leiomyosarcoma  Unfortunately not a candidate for any treatment at this time.    Hypokalemia Replaced.    Anemia of chronic disease Mild  monitor.    Mildly elevated liver enzymes.  Due to mets.    Estimated body mass index is 28.95 kg/m as calculated from the following:   Height as of this encounter: 5' 1 (1.549 m).   Weight as of this encounter: 69.5 kg.  Code Status: full code.  DVT Prophylaxis:  enoxaparin  (LOVENOX ) injection 40 mg Start:  10/11/23 2200   Level of Care: Level of care: Med-Surg Family Communication: none at bedside.   Disposition Plan:     Remains inpatient appropriate:  pending pain control.   Procedures:  None.   Consultants:   Palliative care  Antimicrobials:   Anti-infectives (From admission, onward)    None        Medications  Scheduled Meds:  dexamethasone   4 mg Oral Q8H   enoxaparin  (LOVENOX ) injection  40 mg Subcutaneous Q24H   loratadine   10 mg Oral Daily   methadone   40 mg Oral 2 times per day   methadone   70 mg Oral Daily   pantoprazole   40 mg Oral Daily   pregabalin   150 mg Oral TID   senna-docusate  2 tablet Oral QHS   Continuous Infusions: PRN Meds:.albuterol , ALPRAZolam , cyclobenzaprine , HYDROmorphone  (DILAUDID ) injection, ondansetron  **OR** ondansetron  (ZOFRAN ) IV, oxycodone     Subjective:   [name] was seen and examined today.  Still with significant back pain, but better than yesterday.   Objective:   Vitals:   10/11/23 1553 10/11/23 1623 10/11/23 1945 10/12/23 0431  BP: (!) 141/85  134/86 132/75  Pulse:  90  94 88  Resp:   18 20  Temp: 98.1 F (36.7 C)  98.2 F (36.8 C) 97.7 F (36.5 C)  TempSrc:      SpO2: 97%  93% 98%  Weight:  69.5 kg    Height:  5' 1 (1.549 m)      Intake/Output Summary (Last 24 hours) at 10/12/2023 0958 Last data filed at 10/11/2023 1957 Gross per 24 hour  Intake --  Output 600 ml  Net -600 ml   Filed Weights   10/11/23 1038 10/11/23 1623  Weight: 72 kg 69.5 kg     Exam General exam: Appears calm and comfortable  Respiratory system: Clear to auscultation. Respiratory effort normal. Cardiovascular system: S1 & S2 heard, RRR.  Gastrointestinal system: Abdomen is nondistended, soft and nontender.  Central nervous system: Alert and oriented.  Extremities: Symmetric 5 x 5 power. Skin: No rashes, Psychiatry: Mood & affect appropriate.    Data Reviewed:  I have personally reviewed following labs and imaging  studies   CBC Lab Results  Component Value Date   WBC 6.9 10/12/2023   RBC 4.10 10/12/2023   HGB 11.4 (L) 10/12/2023   HCT 38.5 10/12/2023   MCV 93.9 10/12/2023   MCH 27.8 10/12/2023   PLT 282 10/12/2023   MCHC 29.6 (L) 10/12/2023   RDW 16.5 (H) 10/12/2023   LYMPHSABS 1.0 10/11/2023   MONOABS 0.8 10/11/2023   EOSABS 0.0 10/11/2023   BASOSABS 0.0 10/11/2023     Last metabolic panel Lab Results  Component Value Date   NA 140 10/12/2023   K 4.2 10/12/2023   CL 102 10/12/2023   CO2 30 10/12/2023   BUN 28 (H) 10/12/2023   CREATININE 0.93 10/12/2023   GLUCOSE 105 (H) 10/12/2023   GFRNONAA >60 10/12/2023   GFRAA >60 11/25/2017   CALCIUM  9.0 10/12/2023   PROT 6.6 10/11/2023   ALBUMIN 2.9 (L) 10/11/2023   BILITOT 0.3 10/11/2023   ALKPHOS 64 10/11/2023   AST 44 (H) 10/11/2023   ALT 67 (H) 10/11/2023   ANIONGAP 8 10/12/2023    CBG (last 3)  No results for input(s): GLUCAP in the last 72 hours.    Coagulation Profile: No results for input(s): INR, PROTIME in the last 168 hours.   Radiology Studies: No results found.     Elgie Butter M.D. [facility] 10/12/2023, 9:58 AM  Available via Epic secure chat 7am-7pm After 7 pm, please refer to night coverage provider listed on amion.

## 2023-10-13 DIAGNOSIS — Z66 Do not resuscitate: Secondary | ICD-10-CM | POA: Diagnosis not present

## 2023-10-13 DIAGNOSIS — E876 Hypokalemia: Secondary | ICD-10-CM | POA: Diagnosis present

## 2023-10-13 DIAGNOSIS — R7401 Elevation of levels of liver transaminase levels: Secondary | ICD-10-CM | POA: Diagnosis present

## 2023-10-13 DIAGNOSIS — F17210 Nicotine dependence, cigarettes, uncomplicated: Secondary | ICD-10-CM | POA: Diagnosis present

## 2023-10-13 DIAGNOSIS — Z79891 Long term (current) use of opiate analgesic: Secondary | ICD-10-CM | POA: Diagnosis not present

## 2023-10-13 DIAGNOSIS — I10 Essential (primary) hypertension: Secondary | ICD-10-CM | POA: Diagnosis present

## 2023-10-13 DIAGNOSIS — Z7189 Other specified counseling: Secondary | ICD-10-CM

## 2023-10-13 DIAGNOSIS — D630 Anemia in neoplastic disease: Secondary | ICD-10-CM | POA: Diagnosis present

## 2023-10-13 DIAGNOSIS — Z9221 Personal history of antineoplastic chemotherapy: Secondary | ICD-10-CM | POA: Diagnosis not present

## 2023-10-13 DIAGNOSIS — Z515 Encounter for palliative care: Secondary | ICD-10-CM

## 2023-10-13 DIAGNOSIS — Z789 Other specified health status: Secondary | ICD-10-CM | POA: Diagnosis not present

## 2023-10-13 DIAGNOSIS — Z8541 Personal history of malignant neoplasm of cervix uteri: Secondary | ICD-10-CM | POA: Diagnosis not present

## 2023-10-13 DIAGNOSIS — K219 Gastro-esophageal reflux disease without esophagitis: Secondary | ICD-10-CM | POA: Diagnosis present

## 2023-10-13 DIAGNOSIS — G893 Neoplasm related pain (acute) (chronic): Secondary | ICD-10-CM | POA: Diagnosis present

## 2023-10-13 DIAGNOSIS — C7951 Secondary malignant neoplasm of bone: Secondary | ICD-10-CM | POA: Diagnosis present

## 2023-10-13 DIAGNOSIS — G894 Chronic pain syndrome: Secondary | ICD-10-CM | POA: Diagnosis present

## 2023-10-13 DIAGNOSIS — Z7952 Long term (current) use of systemic steroids: Secondary | ICD-10-CM | POA: Diagnosis not present

## 2023-10-13 DIAGNOSIS — Z923 Personal history of irradiation: Secondary | ICD-10-CM | POA: Diagnosis not present

## 2023-10-13 DIAGNOSIS — F419 Anxiety disorder, unspecified: Secondary | ICD-10-CM | POA: Diagnosis present

## 2023-10-13 DIAGNOSIS — C801 Malignant (primary) neoplasm, unspecified: Secondary | ICD-10-CM | POA: Diagnosis present

## 2023-10-13 NOTE — Evaluation (Signed)
 Physical Therapy Evaluation Patient Details Name: Valerie Kerr MRN: [id number] DOB: [birth year] Today's Date: 10/13/2023  History of Present Illness  63 y.o. 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  She has known progressive bony destruction of the S1 and S2 segments of the sacrum with extensive soft tissue tumor in this area, with associated severe bilateral hip/buttock/lower extremity pain.  Clinical Impression  Pt admitted with above diagnosis. Pt received methadone  prior to PT evaluation, she attempted supine to sit but could not come to full upright seated position 2* severe BLE (L more than R) pain. Dilaudid  requested and RN administered, then pt was able to perform supine to sit and stand pivot to bedside commode but c/o severe pain with all activity. She reported increasing pain in seated position on bedside commode. Pt reports family is having difficulty managing her care at home. Increased level of assistance recommended (home hospice vs. SNF).  Pt currently with functional limitations due to the deficits listed below (see PT Problem List). Pt will benefit from acute skilled PT to increase their independence and safety with mobility to allow discharge.           If plan is discharge home, recommend the following: A lot of help with walking and/or transfers;A lot of help with bathing/dressing/bathroom;Assist for transportation;Help with stairs or ramp for entrance   Can travel by private vehicle   No    Equipment Recommendations None recommended by PT  Recommendations for Other Services       Functional Status Assessment Patient has had a recent decline in their functional status and demonstrates the ability to make significant  improvements in function in a reasonable and predictable amount of time.     Precautions / Restrictions Precautions Precautions: Fall Recall of Precautions/Restrictions: Intact Restrictions Weight Bearing Restrictions Per Provider Order: No      Mobility  Bed Mobility Overal bed mobility: Needs Assistance Bed Mobility: Supine to Sit, Sit to Supine     Supine to sit: Contact guard Sit to supine: Min assist   General bed mobility comments: attempted supine to sit, pt came 1/2 way to upright sitting but reported too much pain and needed to lie back down. Pt requested dilaudid , RN notified. Pt had methadone  prior to PT eval. After administration of dilaudid  pt did perform supine to sit without physical assist but with significant pain.    Transfers Overall transfer level: Needs assistance Equipment used: Rolling walker (2 wheels) Transfers: Sit to/from Stand, Bed to chair/wheelchair/BSC Sit to Stand: Min assist, From elevated surface   Step pivot transfers: Min assist       General transfer comment: assist to pivot to bedside commode then back to bed, uncontrolled descent to bed with pt having difficulty flexing at hips to sit; pt c/o severe pain with all movement. VCs for hand placement and for safe technique. Pt reported BLE pain while sitting on bedside commode, could not tolerate sitting more than ~2 minutes.    Ambulation/Gait                  Stairs            Wheelchair Mobility     Tilt Bed    Modified Rankin (Stroke Patients Only)       Balance Overall  balance assessment: Needs assistance Sitting-balance support: Feet supported, No upper extremity supported Sitting balance-Leahy Scale: Fair     Standing balance support: Bilateral upper extremity supported, During functional activity, Reliant on assistive device for balance Standing balance-Leahy Scale: Poor                               Pertinent Vitals/Pain Pain  Assessment Pain Assessment: 0-10 Pain Score: 7  Pain Location: L posterior thigh and calf Pain Descriptors / Indicators: Jabbing Pain Intervention(s): Limited activity within patient's tolerance, Monitored during session, Premedicated before session, Patient requesting pain meds-RN notified, Repositioned    Home Living Family/patient expects to be discharged to:: Private residence Living Arrangements: Children Available Help at Discharge: Family Type of Home: Apartment Home Access: Level entry       Home Layout: One level Home Equipment: Rollator (4 wheels);BSC/3in1;Hospital bed Additional Comments: lives with daughter who works some days    Prior Function Prior Level of Function : Needs assist             Mobility Comments: was requiring assist to transfer to bedside commode prior to admission. Pt somewhat vague historian. Stated she could walk several months ago. Unclear how long it's been since she could walk. Pt reports family is having difficulty managing her care at home. ADLs Comments: assist needed     Extremity/Trunk Assessment        Lower Extremity Assessment Lower Extremity Assessment: RLE deficits/detail;LLE deficits/detail RLE Deficits / Details: reports tingling B feet L more than R; able to perform heel slide independently but reports its painful RLE Sensation: decreased light touch LLE Deficits / Details: reports tingling B feet L more than R; able to perform heel slide with min assist but reports its painful LLE Sensation: decreased light touch    Cervical / Trunk Assessment Cervical / Trunk Assessment: Normal  Communication   Communication Communication: No apparent difficulties    Cognition Arousal: Alert Behavior During Therapy: WFL for tasks assessed/performed   PT - Cognitive impairments: Memory, No family/caregiver present to determine baseline                       PT - Cognition Comments: vague historian Following commands:  Intact       Cueing Cueing Techniques: Verbal cues     General Comments      Exercises General Exercises - Lower Extremity Ankle Circles/Pumps: AROM, Both, 5 reps, Supine Heel Slides: AAROM, Both, 5 reps, AROM, Supine (AROM R, AAROM L)   Assessment/Plan    PT Assessment Patient needs continued PT services  PT Problem List Decreased strength;Decreased activity tolerance;Decreased mobility;Pain       PT Treatment Interventions      PT Goals (Current goals can be found in the Care Plan section)  Acute Rehab PT Goals Patient Stated Goal: to decrease pain, be able to have a bowel movement PT Goal Formulation: With patient Time For Goal Achievement: 10/27/23 Potential to Achieve Goals: Fair    Frequency Min 2X/week     Co-evaluation               AM-PAC PT 6 Clicks Mobility  Outcome Measure Help needed turning from your back to your side while in a flat bed without using bedrails?: A Little Help needed moving from lying on your back to sitting on the side of a flat bed without using bedrails?: A Lot Help needed  moving to and from a bed to a chair (including a wheelchair)?: A Little Help needed standing up from a chair using your arms (e.g., wheelchair or bedside chair)?: A Little Help needed to walk in hospital room?: Total Help needed climbing 3-5 steps with a railing? : Total 6 Click Score: 13    End of Session Equipment Utilized During Treatment: Gait belt Activity Tolerance: Patient limited by pain Patient left: in bed;with call bell/phone within reach;with bed alarm set Nurse Communication: Mobility status;Patient requests pain meds PT Visit Diagnosis: Difficulty in walking, not elsewhere classified (R26.2)    Time: 9099-9070 PT Time Calculation (min) (ACUTE ONLY): 29 min   Charges:   PT Evaluation $PT Eval Moderate Complexity: 1 Mod PT Treatments $Therapeutic Activity: 8-22 mins PT General Charges $$ ACUTE PT VISIT: 1 Visit        Sylvan Nest Kistler PT 10/13/2023  Acute Rehabilitation Services  Office [phone]

## 2023-10-13 NOTE — Progress Notes (Addendum)
 Daily Progress Note   Patient Name: Valerie Kerr       Date: 10/13/2023 DOB: Jul 13, 1960  Age: 63 y.o. MRN#: [id number] Attending Physician: Cherlyn Labella, MD Primary Care Physician: Delbert Clam, MD Admit Date: 10/11/2023  Reason for Consultation/Follow-up: Establishing goals of care  Patient Profile/HPI:   63 y.o. 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 She was recently admitted  7/12-7/15 for same issues. She was discharged home with hospice support. Palliative consulted for pain control.    Subjective: Chart reviewed including labs, progress notes, imaging from this and previous encounters, MAR.  Per MAR, patient received 1 as needed IV Dilaudid  and 1 dose of as needed oxycodone  in the past 24 hours.  Patient assessed at the bedside.  She reports 5 out of 10 pain and declines this PA's offer to request as needed medication for relief.  We discussed possibility of anesthesia for nerve block rather than IR as she is hopeful for this.  I want to change her medications at this time.  Her pain ranges anywhere from 4-9 out of 10.  Received update from anesthesiology that nerve block is not an option in this patient and situation.  Return to the bedside to share news with the patient.  I discussed pain alternatives management strategies in detail including opioid rotation from p.o. oxycodone  to p.o. Dilaudid  or increased dose of as needed oxycodone .  I encouraged her to avoid IV Dilaudid  and ensure that her oral regimen is helpful/effective before returning home with hospice.  She confirms she still desires to go home with hospice.  She still wants to be full code as well.   Counseled that she is able to take as needed oxycodone  much more frequently if needed.  She still prefers IV Dilaudid  though verbalized understanding.  She does not want to change her methadone  or Lyrica  at this time.  She still feels the steroid is helping.  She requested to take both Dilaudid  and oxycodone ; counseled on the risk of respiratory depression and sedation with multiple short acting opioids.  Questions and concerns addressed.  PMT will support holistically   Review of Systems  Musculoskeletal:  Positive for back pain.    Physical Exam Vitals and nursing note reviewed.  Constitutional:      General: She is not in acute distress. Cardiovascular:     Rate and Rhythm: Normal rate.  Pulmonary:     Effort: Pulmonary effort is normal.  Skin:    General: Skin is warm.  Neurological:     Mental Status: She is alert and oriented to person, place, and time.  Psychiatric:        Mood and Affect: Mood normal.             Vital Signs: BP 123/74 (BP Location: Right Arm)   Pulse 78   Temp 98.4 F (36.9 C) (Oral)   Resp 16   Ht 5' 1 (1.549 m)   Wt 69.5 kg   SpO2 95%   BMI 28.95 kg/m  SpO2: SpO2: 95 % O2 Device:  O2 Device: Room Air O2 Flow Rate:         Palliative Assessment/Data: PPS: 40%    Palliative Care Assessment & Plan    Assessment/Recommendations/Plan -Uncontrolled cancer related pain- continue hydromorphone  1mg  IV q2hrs prn for breakthrough pain; advised trying oral medications first -Continue methadone   70 mg q am, 40mg  in the afternoon and 40mg  at night -Continue dexamethasone  4mg  q8hr -Continue oxycodone  30mg  po q4hrs prn; patient would benefit from taking additional PRN doses and has only requested 1 dose in the past 24 hours. Education provided -Continue senna 2 po at bedtime for constipation prophylaxis -Continue lyrica  to 150mg  TID po - PMT will continue to follow and support  Code Status:   Code Status: Full Code   Prognosis:  < 6  months  Discharge Planning: Home with Hospice  Care plan was discussed with patient, patient's family    MDM: High   Kimmora Risenhoover, PA-C Palliative Medicine Team Team phone # [phone]  Thank you for allowing the Palliative Medicine Team to assist in the care of this patient. Please utilize secure chat with additional questions, if there is no response within 30 minutes please call the above phone number.  Palliative Medicine Team providers are available by phone from 7am to 7pm daily and can be reached through the team cell phone.  Should this patient require assistance outside of these hours, please call the patient's attending physician.  Portions of this note are a verbal dictation therefore any spelling and/or grammatical errors are due to the Dragon Medical One system interpretation.

## 2023-10-13 NOTE — Progress Notes (Signed)
 [facility]                                                                               Desa Rech, is a 63 y.o. female, DOB - 1960-05-06, FMW:[id number] Admit date - 10/11/2023    Outpatient Primary MD for the patient is Newlin, Enobong, MD  LOS - 0  days    Brief summary   Valerie Kerr is a 63 y.o. 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  She has known progressive bony destruction of the S1 and S2 segments of the sacrum with extensive soft tissue tumor in this area, with associated severe bilateral hip/buttock/lower extremity pain.  Oncology notes indicate that she is no longer a candidate for any further systemic chemotherapy, and is not a surgical candidate. Recommendation is for comfort care, but patient is noted to have poor understanding of her disease process.   Palliative care consulted for pain management and symptom control  Assessment & Plan    Assessment and Plan:  Caner related pain, uncontrolled due to S1 S2 destruction  Not a candidate for any treatment at this time.  Unfortunately neither IR nor anaesthesia can do extremity nerve block at [facility].   Oncology recommending comfort care.  Pain control with methadone  70 mg in the morning and 40 mg in the afternoon and 40 mg at night  , Dilaudid , decadron , lyrica .  Palliative on board for pain management.  Bowel regimen , no BM yet.   Stage 4 leiomyosarcoma  Unfortunately not a candidate for any treatment at this time.    Hypokalemia Replaced. Repeat levels wnl.    Anemia of chronic disease Mild  monitor.    Mildly elevated liver enzymes.  Due to mets.    Estimated body mass index is 28.95 kg/m as calculated from the following:    Height as of this encounter: 5' 1 (1.549 m).   Weight as of this encounter: 69.5 kg.  Code Status: full code.  DVT Prophylaxis:  enoxaparin  (LOVENOX ) injection 40 mg Start: 10/11/23 2200   Level of Care: Level of care: Med-Surg Family Communication: none at bedside.   Disposition Plan:     Remains inpatient appropriate: discharge home once pain is well controlled.   Procedures:  None.   Consultants:   Palliative care  Antimicrobials:   Anti-infectives (From admission, onward)    None        Medications  Scheduled Meds:  dexamethasone   4 mg Oral Q8H   enoxaparin  (LOVENOX ) injection  40 mg Subcutaneous Q24H   loratadine   10 mg Oral Daily   methadone   40 mg Oral 2 times per day   methadone   70 mg Oral Daily   pantoprazole   40 mg Oral Daily   polyethylene glycol  17 g Oral BID   pregabalin   150 mg Oral TID   senna-docusate  2 tablet Oral QHS   Continuous Infusions: PRN Meds:.albuterol , ALPRAZolam , cyclobenzaprine , HYDROmorphone  (DILAUDID ) injection, ondansetron  **OR** ondansetron  (ZOFRAN )  IV, oxycodone     Subjective:   Valerie Kerr was seen and examined today.  She is unable to sit up due to pain.  No chest pain or sob.   Objective:   Vitals:   10/12/23 1338 10/12/23 2011 10/13/23 0610 10/13/23 1332  BP: 113/68 124/73 136/80 123/74  Pulse: (!) 109 100 93 78  Resp: 16 16 18 16   Temp: 98.3 F (36.8 C) 98.2 F (36.8 C) 98.1 F (36.7 C) 98.4 F (36.9 C)  TempSrc:    Oral  SpO2: 94% 95% 97% 95%  Weight:      Height:        Intake/Output Summary (Last 24 hours) at 10/13/2023 1737 Last data filed at 10/13/2023 1654 Gross per 24 hour  Intake --  Output 800 ml  Net -800 ml   Filed Weights   10/11/23 1038 10/11/23 1623  Weight: 72 kg 69.5 kg     Exam General exam: Appears calm and comfortable  Respiratory system: Clear to auscultation. Respiratory effort normal. Cardiovascular system: S1 & S2 heard, RRR. Gastrointestinal system: Abdomen is  nondistended, soft and nontender. Central nervous system: Alert and oriented Extremities: no cyanosis.  Skin: No rashes, Psychiatry: mood is appropriate.      Data Reviewed:  I have personally reviewed following labs and imaging studies   CBC Lab Results  Component Value Date   WBC 6.9 10/12/2023   RBC 4.10 10/12/2023   HGB 11.4 (L) 10/12/2023   HCT 38.5 10/12/2023   MCV 93.9 10/12/2023   MCH 27.8 10/12/2023   PLT 282 10/12/2023   MCHC 29.6 (L) 10/12/2023   RDW 16.5 (H) 10/12/2023   LYMPHSABS 1.0 10/11/2023   MONOABS 0.8 10/11/2023   EOSABS 0.0 10/11/2023   BASOSABS 0.0 10/11/2023     Last metabolic panel Lab Results  Component Value Date   NA 140 10/12/2023   K 4.2 10/12/2023   CL 102 10/12/2023   CO2 30 10/12/2023   BUN 28 (H) 10/12/2023   CREATININE 0.93 10/12/2023   GLUCOSE 105 (H) 10/12/2023   GFRNONAA >60 10/12/2023   GFRAA >60 11/25/2017   CALCIUM  9.0 10/12/2023   PROT 6.6 10/11/2023   ALBUMIN 2.9 (L) 10/11/2023   BILITOT 0.3 10/11/2023   ALKPHOS 64 10/11/2023   AST 44 (H) 10/11/2023   ALT 67 (H) 10/11/2023   ANIONGAP 8 10/12/2023    CBG (last 3)  No results for input(s): GLUCAP in the last 72 hours.    Coagulation Profile: No results for input(s): INR, PROTIME in the last 168 hours.   Radiology Studies: No results found.     Elgie Butter M.D. [facility] 10/13/2023, 5:37 PM  Available via Epic secure chat 7am-7pm After 7 pm, please refer to night coverage provider listed on amion.

## 2023-10-13 NOTE — Plan of Care (Signed)

## 2023-10-13 NOTE — TOC Progression Note (Addendum)
 Transition of Care [facility]) - Progression Note    Patient Details  Name: Valerie Kerr MRN: [id number] Date of Birth: 1960/05/13  Transition [facility] CM/SW Contact  Doneta Glenys DASEN, RN Phone Number: 10/13/2023, 9:18 AM  Clinical Narrative:    Will continue to follow to discharge.  Expected Discharge Plan: Home w Hospice Care Barriers to Discharge: Continued Medical Work up  Expected Discharge Plan and Services In-house Referral: NA Discharge Planning Services: CM Consult Post Acute Care Choice: NA Living arrangements for the past 2 months: Apartment                 DME Arranged: N/A DME Agency: NA       HH Arranged: NA HH Agency: NA         Social Determinants of Health (SDOH) Interventions SDOH Screenings   Food Insecurity: No Food Insecurity (10/11/2023)  Housing: Low Risk  (10/11/2023)  Transportation Needs: No Transportation Needs (10/11/2023)  Utilities: Not At Risk (10/11/2023)  Depression (PHQ2-9): Low Risk  (04/13/2023)  Social Connections: Feeling Socially Integrated (04/19/2023)   Received from [facility]  Tobacco Use: High Risk (10/11/2023)  Health Literacy: Adequate Health Literacy (04/19/2023)   Received from [facility]    Readmission Risk Interventions     No data to display

## 2023-10-14 DIAGNOSIS — G893 Neoplasm related pain (acute) (chronic): Secondary | ICD-10-CM | POA: Diagnosis not present

## 2023-10-14 NOTE — Plan of Care (Signed)
   Medical records reviewed including progress notes, labs, and imaging, MAR. Patient received 3 doses of PRN dilaudid  and 1 dose of PRN oxycodone  in the past 24 hours.  Called patient to follow up on her pain management regimen. She is in some pain at the time of my call (about 4.5 hours after receiving the PO Oxycodone ) and overall improved with steroids. I reminded her of the availability of Oxycodone  every 4 hours PRN. She again voiced her preference to use IV dilaudid  while in the hospital since it works better for her nerve pain.   I explored her feelings on her pain and readiness to return home with hospice. She stated she has discussed potential date of Monday with her doctor. She declined needing additional changes to her pain medications or other palliative needs today.  PMT remains available for additional needs should they arise.  Thank you for your referral and allowing PMT to assist in Valerie Kerr's care.   Elfreida Heggs, PA-C Palliative Medicine Team  Team Phone # [phone]   NO CHARGE

## 2023-10-14 NOTE — Progress Notes (Signed)
 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  She has known progressive bony destruction of the S1 and S2 segments of the sacrum with extensive soft tissue tumor in this area, with associated severe bilateral hip/buttock/lower extremity pain.  Oncology notes indicate that she is no longer a candidate for any further systemic chemotherapy, and is not a surgical candidate. Recommendation is for comfort care, but patient is noted to have poor understanding of her disease process.    Palliative care consulted for pain management and symptom control.   Assessment & Plan:   Principal Problem:   Cancer-related breakthrough pain from Metastatic leiomyosarcoma of the bone /Chronic pain syndrome Active Problems:   Cancer associated pain  Caner related pain, uncontrolled due to S1 S2 destruction: She is not a candidate for any cancer treatment at this time.  Unfortunately neither IR nor anaesthesia can do extremity nerve block at [facility].   Oncology recommending comfort care.  Pain control with methadone  70 mg in the morning and 40 mg in the afternoon and 40 mg at night  , Dilaudid , decadron , lyrica .  Palliative on board for pain management.  Aggressive bowel regimen as since patient is on high-dose opioids.   Stage 4 leiomyosarcoma: Unfortunately not a candidate for any treatment at this time.    Hypokalemia: Replaced.  Continue to monitor.   Anemia of chronic disease: Mild  monitor.     Mildly elevated liver enzymes: Due to mets.     Estimated body mass index is 28.95 kg/m as calculated from the  following:   Height as of this encounter: 5' 1 (1.549 m).   Weight as of this encounter: 69.5 kg.   DVT prophylaxis: Lovenox  Code Status: Full code Family Communication: No family at bed side Disposition Plan:    Status is: Inpatient Remains inpatient appropriate because: Admitted for pain control.    Consultants:  Palliative Care  Procedures: None  Antimicrobials:  Anti-infectives (From admission, onward)    None      Subjective: Patient is seen and examined at bedside.  Overnight events noted. Patient reports continued to have significant pain but with increasing pain medication pain is now reasonably controlled.   Objective: Vitals:   10/13/23 1332 10/13/23 2000 10/14/23 0457 10/14/23 1141  BP: 123/74 124/82 112/76 (!) 126/104  Pulse: 78 93 95 95  Resp: 16 17 18 18   Temp: 98.4 F (36.9 C) 97.7 F (36.5 C) 97.9 F (36.6 C) 98.4 F (36.9 C)  TempSrc: Oral Oral Oral   SpO2: 95% 94% 96% 94%  Weight:      Height:        Intake/Output Summary (Last 24 hours) at 10/14/2023 1509 Last data filed at 10/14/2023 0801 Gross per 24 hour  Intake 120 ml  Output 450 ml  Net -330 ml   Filed Weights   10/11/23 1038 10/11/23 1623  Weight: 72 kg 69.5 kg    Examination:  General exam: Appears calm and comfortable, deconditioned, not in any acute distress. Respiratory system: Clear to auscultation. Respiratory effort normal.  RR 16 Cardiovascular system: S1 & S2 heard, RRR. No JVD, murmurs, rubs, gallops or  clicks.  Gastrointestinal system: Abdomen is non distended, soft and non tender. Normal bowel sounds heard. Central nervous system: Alert and oriented x 3. No focal neurological deficits. Extremities: No edema, no cyanosis, no clubbing. Skin: No rashes, lesions or ulcers Psychiatry: Judgement and insight appear normal. Mood & affect appropriate.   Data Reviewed: I have personally reviewed following labs and imaging studies  CBC: Recent Labs  Lab 10/07/23 1654  10/11/23 1238 10/12/23 0436  WBC 8.2 8.8 6.9  NEUTROABS  --  6.8  --   HGB 11.2* 11.6* 11.4*  HCT 36.3 38.5 38.5  MCV 91.0 91.9 93.9  PLT 362 292 282   Basic Metabolic Panel: Recent Labs  Lab 10/07/23 1654 10/07/23 2310 10/08/23 0834 10/11/23 1238 10/12/23 0436  NA 139  --  138 142 140  K 3.3*  --  4.9 3.0* 4.2  CL 100  --  102 104 102  CO2 28  --  26 29 30   GLUCOSE 77  --  193* 79 105*  BUN 13  --  17 24* 28*  CREATININE 0.90  --  0.94 0.79 0.93  CALCIUM  9.1  --  9.2 7.9* 9.0  MG  --  1.9  --   --   --    GFR: Estimated Creatinine Clearance: 54.5 mL/min (by C-G formula based on SCr of 0.93 mg/dL). Liver Function Tests: Recent Labs  Lab 10/07/23 1654 10/11/23 1238  AST 13* 44*  ALT 12 67*  ALKPHOS 67 64  BILITOT 0.5 0.3  PROT 6.9 6.6  ALBUMIN 3.0* 2.9*   No results for input(s): LIPASE, AMYLASE in the last 168 hours. No results for input(s): AMMONIA in the last 168 hours. Coagulation Profile: No results for input(s): INR, PROTIME in the last 168 hours. Cardiac Enzymes: No results for input(s): CKTOTAL, CKMB, CKMBINDEX, TROPONINI in the last 168 hours. BNP (last 3 results) No results for input(s): PROBNP in the last 8760 hours. HbA1C: No results for input(s): HGBA1C in the last 72 hours. CBG: No results for input(s): GLUCAP in the last 168 hours. Lipid Profile: No results for input(s): CHOL, HDL, LDLCALC, TRIG, CHOLHDL, LDLDIRECT in the last 72 hours. Thyroid Function Tests: No results for input(s): TSH, T4TOTAL, FREET4, T3FREE, THYROIDAB in the last 72 hours. Anemia Panel: No results for input(s): VITAMINB12, FOLATE, FERRITIN, TIBC, IRON, RETICCTPCT in the last 72 hours. Sepsis Labs: No results for input(s): PROCALCITON, LATICACIDVEN in the last 168 hours.  No results found for this or any previous visit (from the past 240 hours).   Radiology Studies: No results found.  Scheduled Meds:   dexamethasone   4 mg Oral Q8H   enoxaparin  (LOVENOX ) injection  40 mg Subcutaneous Q24H   loratadine   10 mg Oral Daily   methadone   40 mg Oral 2 times per day   methadone   70 mg Oral Daily   pantoprazole   40 mg Oral Daily   polyethylene glycol  17 g Oral BID   pregabalin   150 mg Oral TID   senna-docusate  2 tablet Oral QHS   Continuous Infusions:   LOS: 1 day    Time spent: 50 mins    Darcel Dawley, MD [facility]   If 7PM-7AM, please contact night-coverage

## 2023-10-15 DIAGNOSIS — G893 Neoplasm related pain (acute) (chronic): Secondary | ICD-10-CM | POA: Diagnosis not present

## 2023-10-15 NOTE — Progress Notes (Signed)
 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  She has known progressive bony destruction of the S1 and S2 segments of the sacrum with extensive soft tissue tumor in this area, with associated severe bilateral hip/buttock/lower extremity pain.  Oncology notes indicate that she is no longer a candidate for any further systemic chemotherapy, and is not a surgical candidate. Recommendation is for comfort care, but patient is noted to have poor understanding of her disease process.    Palliative care consulted for pain management and symptom control.   Assessment & Plan:   Principal Problem:   Cancer-related breakthrough pain from Metastatic leiomyosarcoma of the bone /Chronic pain syndrome Active Problems:   Cancer associated pain  Caner related pain, uncontrolled due to S1 S2 destruction: She is not a candidate for any cancer treatment at this time.  Unfortunately neither IR nor anaesthesia can do extremity nerve block at [facility].   Oncology recommending comfort care.  Pain control with methadone  70 mg in the morning and 40 mg in the afternoon and 40 mg at night  , Dilaudid , decadron , lyrica .  Palliative on board for pain management.  Aggressive bowel regimen as since patient is on high-dose opioids.   Stage 4 leiomyosarcoma: Unfortunately not a candidate for any treatment at this time.    Hypokalemia: Replaced.  Continue to monitor.   Anemia of chronic disease: Mild  monitor.    Mildly elevated liver enzymes: Due to mets. Continue to mnitor     Estimated body mass index is 28.95 kg/m as  calculated from the following:   Height as of this encounter: 5' 1 (1.549 m).   Weight as of this encounter: 69.5 kg.   DVT prophylaxis: Lovenox  Code Status: Full code Family Communication: No family at bed side Disposition Plan:    Status is: Inpatient Remains inpatient appropriate because: Admitted for pain control.    Consultants:  Palliative Care  Procedures: None  Antimicrobials:  Anti-infectives (From admission, onward)    None      Subjective: Patient is seen and examined at bedside. Overnight events noted. Patient reports continued to have significant pain but with increasing pain medication pain is now reasonably controlled.   Objective: Vitals:   10/14/23 1141 10/14/23 2023 10/15/23 0455 10/15/23 1125  BP: (!) 126/104 (!) 151/83 129/78 (!) 143/77  Pulse: 95 98 85 99  Resp: 18 18 17 18   Temp: 98.4 F (36.9 C) 98.1 F (36.7 C) 97.8 F (36.6 C) 97.7 F (36.5 C)  TempSrc:      SpO2: 94% 100% 98% 98%  Weight:      Height:        Intake/Output Summary (Last 24 hours) at 10/15/2023 1308 Last data filed at 10/15/2023 0900 Gross per 24 hour  Intake 480 ml  Output 1175 ml  Net -695 ml   Filed Weights   10/11/23 1038 10/11/23 1623  Weight: 72 kg 69.5 kg    Examination:  General exam: Appears calm and comfortable, deconditioned, not in any acute distress. Respiratory system: CTA Bilaterally . Respiratory effort normal.  RR 16 Cardiovascular system: S1 & S2 heard, RRR. No JVD, murmurs,  rubs, gallops or clicks.  Gastrointestinal system: Abdomen is non distended, soft and non tender. Normal bowel sounds heard. Central nervous system: Alert and oriented x 3. No focal neurological deficits. Extremities: No edema, no cyanosis, no clubbing. Skin: No rashes, lesions or ulcers Psychiatry: Judgement and insight appear normal. Mood & affect appropriate.   Data Reviewed: I have personally reviewed following labs and imaging studies  CBC: Recent Labs  Lab  10/11/23 1238 10/12/23 0436  WBC 8.8 6.9  NEUTROABS 6.8  --   HGB 11.6* 11.4*  HCT 38.5 38.5  MCV 91.9 93.9  PLT 292 282   Basic Metabolic Panel: Recent Labs  Lab 10/11/23 1238 10/12/23 0436  NA 142 140  K 3.0* 4.2  CL 104 102  CO2 29 30  GLUCOSE 79 105*  BUN 24* 28*  CREATININE 0.79 0.93  CALCIUM  7.9* 9.0   GFR: Estimated Creatinine Clearance: 54.5 mL/min (by C-G formula based on SCr of 0.93 mg/dL). Liver Function Tests: Recent Labs  Lab 10/11/23 1238  AST 44*  ALT 67*  ALKPHOS 64  BILITOT 0.3  PROT 6.6  ALBUMIN 2.9*   No results for input(s): LIPASE, AMYLASE in the last 168 hours. No results for input(s): AMMONIA in the last 168 hours. Coagulation Profile: No results for input(s): INR, PROTIME in the last 168 hours. Cardiac Enzymes: No results for input(s): CKTOTAL, CKMB, CKMBINDEX, TROPONINI in the last 168 hours. BNP (last 3 results) No results for input(s): PROBNP in the last 8760 hours. HbA1C: No results for input(s): HGBA1C in the last 72 hours. CBG: No results for input(s): GLUCAP in the last 168 hours. Lipid Profile: No results for input(s): CHOL, HDL, LDLCALC, TRIG, CHOLHDL, LDLDIRECT in the last 72 hours. Thyroid Function Tests: No results for input(s): TSH, T4TOTAL, FREET4, T3FREE, THYROIDAB in the last 72 hours. Anemia Panel: No results for input(s): VITAMINB12, FOLATE, FERRITIN, TIBC, IRON, RETICCTPCT in the last 72 hours. Sepsis Labs: No results for input(s): PROCALCITON, LATICACIDVEN in the last 168 hours.  No results found for this or any previous visit (from the past 240 hours).   Radiology Studies: No results found.  Scheduled Meds:  dexamethasone   4 mg Oral Q8H   enoxaparin  (LOVENOX ) injection  40 mg Subcutaneous Q24H   loratadine   10 mg Oral Daily   methadone   40 mg Oral 2 times per day   methadone   70 mg Oral Daily   pantoprazole   40 mg Oral Daily   polyethylene  glycol  17 g Oral BID   pregabalin   150 mg Oral TID   senna-docusate  2 tablet Oral QHS   Continuous Infusions:   LOS: 2 days    Time spent: 35 mins    Darcel Dawley, MD [facility]   If 7PM-7AM, please contact night-coverage

## 2023-10-16 DIAGNOSIS — G893 Neoplasm related pain (acute) (chronic): Secondary | ICD-10-CM | POA: Diagnosis not present

## 2023-10-16 NOTE — Progress Notes (Signed)
 [facility]  This pt remains active with hospice services. She was seen today for us  to discuss  hospice services to help her at home. She is in agreement with going back home with hospice. She is reporting that she has pain due to working with PT just prior to my arrival. She has utilized minimal amt of pain meds PRN over pat 24 hours. We discussed our ability to assist with pain management in the home after d/c and that we also have our hospice facility to use if needed after d/c if pain not able to be controlled in the home by taking po meds.    Our home care staff are prepared to visit today or tomorrow when pt felt to be ready for d/c. She will need to go home by ambulance due to inability to sit up in a car due to her worsening pain with sitting.   This hospitalization is a related and covered hospitalization by [facility].   Magdalena Berber RN [phone]

## 2023-10-16 NOTE — Plan of Care (Signed)
   Medical records reviewed including progress notes, labs, and imaging, MAR. Patient has received 1 dose of PRN dilaudid  and 1 dose of PRN oxycodone  in the past 24 hours.  She has declined further titration or adjustment of her oxycodone  or other pain medications.  Goal is to return home with hospice per our last conversation.  PMT will follow peripherally and remains available for additional needs should they arise.  Thank you for your referral and allowing PMT to assist in [name]'s care.   Valerie Kimberlin, PA-C Palliative Medicine Team  Team Phone # [phone]   NO CHARGE

## 2023-10-16 NOTE — Progress Notes (Signed)
 Physical Therapy Treatment Patient Details Name: Valerie Kerr MRN: [id number] DOB: Dec 28, 1960 Today's Date: 10/16/2023   History of Present Illness 63 y.o. 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  She has known progressive bony destruction of the S1 and S2 segments of the sacrum with extensive soft tissue tumor in this area, with associated severe bilateral hip/buttock/lower extremity pain.    PT Comments   Pt admitted with above diagnosis.  Pt currently with functional limitations due to the deficits listed below (see PT Problem List). Pt in bed when PT arrived. Pt indicated continued pain B LE and PT coordinated with nursing staff prior to therapy intervention with minimal resolve. Pt agreeable to therapy and required increased time and CGA for supine to sit, CGA for sit to stand  from EOB, CGA for side stepping to R toward HOB 3 feet and min A to return to bed. Pt limited due to pain today. Pt left in bed, all needs in place, use of pillows as props. D/c home with hospice vs continued inpatient follow up therapy, <3 hours/day. Pt will benefit from acute skilled PT to increase their independence and safety with mobility to allow discharge.      If plan is discharge home, recommend the following: A lot of help with walking and/or transfers;A lot of help with bathing/dressing/bathroom;Assist for transportation;Help with stairs or ramp for entrance   Can travel by private vehicle     No  Equipment Recommendations  None recommended by PT    Recommendations for Other Services       Precautions / Restrictions Precautions Precautions: Fall Recall of Precautions/Restrictions: Intact Restrictions Weight Bearing Restrictions Per Provider  Order: No     Mobility  Bed Mobility Overal bed mobility: Needs Assistance Bed Mobility: Supine to Sit, Sit to Supine     Supine to sit: Contact guard, HOB elevated Sit to supine: Min assist   General bed mobility comments: pt able to perform supine to sit EOB and reported feeling slightly dizzy at EOB and like her feet were burning, pt required min A for B LE to return to bed and able to reposition self with bed flat    Transfers Overall transfer level: Needs assistance Equipment used: Rolling walker (2 wheels) Transfers: Sit to/from Stand Sit to Stand: From elevated surface, Contact guard assist           General transfer comment: pt indicated she was in too much pain to attempt SPT to recliner today, pt was CGA for pull to stand at RW with min cues    Ambulation/Gait Ambulation/Gait assistance: Contact guard assist Gait Distance (Feet): 3 Feet Assistive device: Rolling walker (2 wheels) Gait Pattern/deviations: Step-to pattern Gait velocity: decreased     General Gait Details: pt able to perform side stepping to [facility] to the R with CGA and cues for improved positioning upon return to bed   Stairs             Wheelchair Mobility     Tilt Bed    Modified Rankin (Stroke Patients Only)       Balance Overall balance assessment: Needs assistance Sitting-balance support: Feet supported, No upper extremity supported Sitting balance-Leahy Scale: Fair     Standing balance support: Bilateral upper extremity supported, During functional activity, Reliant  on assistive device for balance Standing balance-Leahy Scale: Poor                              Communication Communication Communication: No apparent difficulties  Cognition Arousal: Alert Behavior During Therapy: WFL for tasks assessed/performed   PT - Cognitive impairments: Memory, No family/caregiver present to determine baseline                         Following commands:  Intact      Cueing Cueing Techniques: Verbal cues  Exercises General Exercises - Lower Extremity Heel Slides:  (AROM R, AAROM L)    General Comments        Pertinent Vitals/Pain Pain Assessment Pain Assessment: 0-10 Pain Score: 10-Worst pain ever Pain Location: B feet and sacral area Pain Descriptors / Indicators: Jabbing, Guarding, Sharp, Shooting, Stabbing, Tingling Pain Intervention(s): Limited activity within patient's tolerance, Monitored during session, Premedicated before session, Repositioned    Home Living                          Prior Function            PT Goals (current goals can now be found in the care plan section) Acute Rehab PT Goals Patient Stated Goal: to decrease pain, be able to have a bowel movement PT Goal Formulation: With patient Time For Goal Achievement: 10/27/23 Potential to Achieve Goals: Fair Progress towards PT goals: Progressing toward goals (slowly due to pain)    Frequency    Min 2X/week      PT Plan      Co-evaluation              AM-PAC PT 6 Clicks Mobility   Outcome Measure  Help needed turning from your back to your side while in a flat bed without using bedrails?: A Little Help needed moving from lying on your back to sitting on the side of a flat bed without using bedrails?: A Lot Help needed moving to and from a bed to a chair (including a wheelchair)?: A Little Help needed standing up from a chair using your arms (e.g., wheelchair or bedside chair)?: A Little Help needed to walk in hospital room?: A Little Help needed climbing 3-5 steps with a railing? : Total 6 Click Score: 15    End of Session Equipment Utilized During Treatment: Gait belt Activity Tolerance: Patient limited by pain Patient left: in bed;with call bell/phone within reach Nurse Communication: Mobility status;Patient requests pain meds PT Visit Diagnosis: Difficulty in walking, not elsewhere classified (R26.2)     Time:  1121-1140 PT Time Calculation (min) (ACUTE ONLY): 19 min  Charges:    $Therapeutic Activity: 8-22 mins PT General Charges $$ ACUTE PT VISIT: 1 Visit                     [place], PT Acute Rehab    [place] VEAR Drone 10/16/2023, 12:04 PM

## 2023-10-16 NOTE — TOC Progression Note (Addendum)
 Transition of Care [facility]) - Progression Note    Patient Details  Name: Valerie Kerr MRN: [id number] Date of Birth: [birth year]  Transition of Care [facility] CM/SW Contact  Zarina Pe, [place] DASEN, RN Phone Number:[phone]   Clinical Narrative:    CM spoke with patient in the room. Patient is refusing SNF due to pain control and wants to be discharge home with hospice care. Cheri with [facility] will speak with patient to remind her of all the care/services that are/will be available to assist.  CM will wait on note from [facility].  Expected Discharge Plan: Home w Hospice Care Barriers to Discharge: Continued Medical Work up  Expected Discharge Plan and Services In-house Referral: NA Discharge Planning Services: CM Consult Post Acute Care Choice: NA Living arrangements for the past 2 months: Apartment                 DME Arranged: N/A DME Agency: NA       HH Arranged: NA HH Agency: NA         Social Determinants of Health (SDOH) Interventions SDOH Screenings   Food Insecurity: No Food Insecurity (10/11/2023)  Housing: Low Risk  (10/11/2023)  Transportation Needs: No Transportation Needs (10/11/2023)  Utilities: Not At Risk (10/11/2023)  Depression (PHQ2-9): Low Risk  (04/13/2023)  Social Connections: Feeling Socially Integrated (04/19/2023)   Received from [facility]  Tobacco Use: High Risk (10/11/2023)  Health Literacy: Adequate Health Literacy (04/19/2023)   Received from [facility]    Readmission Risk Interventions     No data to display

## 2023-10-16 NOTE — Progress Notes (Signed)
 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  She has known progressive bony destruction of the S1 and S2 segments of the sacrum with extensive soft tissue tumor in this area, with associated severe bilateral hip/buttock/lower extremity pain.  Oncology notes indicate that she is no longer a candidate for any further systemic chemotherapy, and is not a surgical candidate. Recommendation is for comfort care, but patient is noted to have poor understanding of her disease process.    Palliative care consulted for pain management and symptom control.   Assessment & Plan:   Principal Problem:   Cancer-related breakthrough pain from Metastatic leiomyosarcoma of the bone /Chronic pain syndrome Active Problems:   Cancer associated pain  Cancer related pain, uncontrolled due to S1 S2 destruction: She is not a candidate for any cancer treatment at this time.  Unfortunately neither IR nor anaesthesia can do extremity nerve block at [facility].   Oncology recommending comfort care.  Pain control with methadone  70 mg in the morning and 40 mg in the afternoon and 40 mg at night  , Dilaudid , decadron , lyrica .  Palliative on board for pain management.  Aggressive bowel regimen as since patient is on high-dose opioids. Palliative care consult appreciated.  Patient does not want to be discharged to SNF she wants to home with home hospice. [facility] the patient and possibly support the discharge.    Stage 4 leiomyosarcoma: Unfortunately not a candidate for any treatment at this time.     Hypokalemia: Replaced.  Continue to monitor.   Anemia of chronic disease: Mild  monitor.    Mildly elevated liver enzymes: Due to mets. Continue to mnitor     Estimated body mass index is 28.95 kg/m as calculated from the following:   Height as of this encounter: 5' 1 (1.549 m).   Weight as of this encounter: 69.5 kg.   DVT prophylaxis: Lovenox  Code Status: Full code Family Communication: No family at bed side Disposition Plan:    Status is: Inpatient Remains inpatient appropriate because: Admitted for pain control.    Consultants:  Palliative Care  Procedures: None  Antimicrobials:  Anti-infectives (From admission, onward)    None      Subjective: Patient is seen and examined at bedside. Overnight events noted. Patient reports pain is reasonably controlled. She does not want to be discharged to SNF and wants to be discharged home with hospice.   Objective: Vitals:   10/15/23 1125 10/15/23 1940 10/16/23 0506 10/16/23 1216  BP: (!) 143/77 (!) 143/82 136/85 134/73  Pulse: 99 95 88   Resp: 18   18  Temp: 97.7 F (36.5 C) 97.7 F (36.5 C) 97.9 F (36.6 C) 97.7 F (36.5 C)  TempSrc:  Oral  Oral  SpO2: 98% 98% 96% 94%  Weight:      Height:        Intake/Output Summary (Last 24 hours) at 10/16/2023 1427 Last data filed at 10/16/2023 0900 Gross per 24 hour  Intake 240 ml  Output 900 ml  Net -660 ml   Filed Weights   10/11/23 1038 10/11/23 1623  Weight: 72 kg 69.5  kg    Examination:  General exam: Appears calm and comfortable, deconditioned, not in any acute distress. Respiratory system: CTA Bilaterally . Respiratory effort normal.  RR 16 Cardiovascular system: S1 & S2 heard, RRR. No JVD, murmurs, rubs, gallops or clicks.  Gastrointestinal system: Abdomen is non distended, soft and non tender. Normal bowel sounds heard. Central nervous system: Alert and oriented x 3. No focal neurological deficits. Extremities: No edema, no cyanosis, no  clubbing. Skin: No rashes, lesions or ulcers Psychiatry: Judgement and insight appear normal. Mood & affect appropriate.   Data Reviewed: I have personally reviewed following labs and imaging studies  CBC: Recent Labs  Lab 10/11/23 1238 10/12/23 0436  WBC 8.8 6.9  NEUTROABS 6.8  --   HGB 11.6* 11.4*  HCT 38.5 38.5  MCV 91.9 93.9  PLT 292 282   Basic Metabolic Panel: Recent Labs  Lab 10/11/23 1238 10/12/23 0436  NA 142 140  K 3.0* 4.2  CL 104 102  CO2 29 30  GLUCOSE 79 105*  BUN 24* 28*  CREATININE 0.79 0.93  CALCIUM  7.9* 9.0   GFR: Estimated Creatinine Clearance: 54.5 mL/min (by C-G formula based on SCr of 0.93 mg/dL). Liver Function Tests: Recent Labs  Lab 10/11/23 1238  AST 44*  ALT 67*  ALKPHOS 64  BILITOT 0.3  PROT 6.6  ALBUMIN 2.9*   No results for input(s): LIPASE, AMYLASE in the last 168 hours. No results for input(s): AMMONIA in the last 168 hours. Coagulation Profile: No results for input(s): INR, PROTIME in the last 168 hours. Cardiac Enzymes: No results for input(s): CKTOTAL, CKMB, CKMBINDEX, TROPONINI in the last 168 hours. BNP (last 3 results) No results for input(s): PROBNP in the last 8760 hours. HbA1C: No results for input(s): HGBA1C in the last 72 hours. CBG: No results for input(s): GLUCAP in the last 168 hours. Lipid Profile: No results for input(s): CHOL, HDL, LDLCALC, TRIG, CHOLHDL, LDLDIRECT in the last 72 hours. Thyroid Function Tests: No results for input(s): TSH, T4TOTAL, FREET4, T3FREE, THYROIDAB in the last 72 hours. Anemia Panel: No results for input(s): VITAMINB12, FOLATE, FERRITIN, TIBC, IRON, RETICCTPCT in the last 72 hours. Sepsis Labs: No results for input(s): PROCALCITON, LATICACIDVEN in the last 168 hours.  No results found for this or any previous visit (from the past 240 hours).   Radiology Studies: No results found.  Scheduled Meds:  dexamethasone    4 mg Oral Q8H   enoxaparin  (LOVENOX ) injection  40 mg Subcutaneous Q24H   loratadine   10 mg Oral Daily   methadone   40 mg Oral 2 times per day   methadone   70 mg Oral Daily   pantoprazole   40 mg Oral Daily   polyethylene glycol  17 g Oral BID   pregabalin   150 mg Oral TID   senna-docusate  2 tablet Oral QHS   Continuous Infusions:   LOS: 3 days    Time spent: 35 mins    Darcel Dawley, MD [facility]   If 7PM-7AM, please contact night-coverage

## 2023-10-16 NOTE — Plan of Care (Signed)
  Problem: Education: Goal: Knowledge of General Education information will improve Description: Including pain rating scale, medication(s)/side effects and non-pharmacologic comfort measures 10/16/2023 1644 by Derk Pump, RN Outcome: Progressing 10/16/2023 1644 by Derk Pump, RN Outcome: Progressing   Problem: Health Behavior/Discharge Planning: Goal: Ability to manage health-related needs will improve 10/16/2023 1644 by Derk Pump, RN Outcome: Progressing 10/16/2023 1644 by Derk Pump, RN Outcome: Progressing   Problem: Clinical Measurements: Goal: Ability to maintain clinical measurements within normal limits will improve 10/16/2023 1644 by Derk Pump, RN Outcome: Progressing 10/16/2023 1644 by Derk Pump, RN Outcome: Progressing Goal: Will remain free from infection Outcome: Progressing Goal: Diagnostic test results will improve Outcome: Progressing Goal: Respiratory complications will improve 10/16/2023 1644 by Derk Pump, RN Outcome: Progressing 10/16/2023 1644 by Derk Pump, RN Outcome: Progressing Goal: Cardiovascular complication will be avoided 10/16/2023 1644 by Derk Pump, RN Outcome: Progressing 10/16/2023 1644 by Derk Pump, RN Outcome: Progressing   Problem: Activity: Goal: Risk for activity intolerance will decrease Outcome: Progressing   Problem: Nutrition: Goal: Adequate nutrition will be maintained Outcome: Progressing   Problem: Coping: Goal: Level of anxiety will decrease Outcome: Progressing   Problem: Elimination: Goal: Will not experience complications related to bowel motility Outcome: Progressing Goal: Will not experience complications related to urinary retention Outcome: Progressing   Problem: Pain Managment: Goal: General experience of comfort will improve and/or be controlled Outcome: Progressing   Problem: Safety: Goal: Ability to remain free from injury will improve Outcome: Progressing   Problem: Skin  Integrity: Goal: Risk for impaired skin integrity will decrease Outcome: Progressing

## 2023-10-17 ENCOUNTER — Other Ambulatory Visit (HOSPITAL_COMMUNITY): Payer: Self-pay

## 2023-10-17 DIAGNOSIS — G893 Neoplasm related pain (acute) (chronic): Secondary | ICD-10-CM | POA: Diagnosis not present

## 2023-10-17 MED ORDER — OXYCODONE HCL 30 MG PO TABS
30.0000 mg | ORAL_TABLET | ORAL | 0 refills | Status: DC | PRN
  Filled 2023-10-17 – 2023-11-03 (×2): qty 15, 3d supply, fill #0

## 2023-10-17 MED ORDER — ALPRAZOLAM 0.25 MG PO TABS
0.1250 mg | ORAL_TABLET | Freq: Two times a day (BID) | ORAL | 0 refills | Status: AC | PRN
  Filled 2023-10-17 – 2023-11-23 (×2): qty 6, 6d supply, fill #0

## 2023-10-17 MED ORDER — DEXAMETHASONE 4 MG PO TABS
4.0000 mg | ORAL_TABLET | Freq: Three times a day (TID) | ORAL | 0 refills | Status: AC
  Filled 2023-10-17: qty 30, 10d supply, fill #0

## 2023-10-17 MED ORDER — METHADONE HCL 10 MG PO TABS
ORAL_TABLET | ORAL | 0 refills | Status: DC
  Filled 2023-10-17: qty 33, 3d supply, fill #0

## 2023-10-17 NOTE — Progress Notes (Signed)
 Discharge medication delivered to patient at the bedside [facility]

## 2023-10-17 NOTE — TOC Transition Note (Signed)
 Transition of Care [facility]) - Discharge Note   Patient Details  Name: Valerie Kerr MRN: [id number] Date of Birth: 04-Aug-1960  Transition of Care [facility]) CM/SW Contact:  Doneta Glenys DASEN, RN Phone Number: 10/17/2023, 12:31 PM   Clinical Narrative:    Per MD patient is medically ready for discharge. CM spoke with patient and she is aware that medications will be delieved to her room and will be transported via [facility] to home. CM spoke with both Deland [phone] and [place] [phone] to inform them of patients discharge. Aremessie states she will be home to receive patient. CM notified Cheri with [facility] of patients discharge. 2:05 PM [facility] has been called. CM signing off.   Final next level of care: Home w Hospice Care Barriers to Discharge: Barriers Resolved   Patient Goals and CMS Choice Patient states their goals for this hospitalization and ongoing recovery are:: Home with [facility].gov Compare Post Acute Care list provided to::  (NA) Choice offered to / list presented to : NA [place] ownership interest in [facility].provided to:: Parent NA    Discharge Placement                  Name of family member notified: Deland [phone] and Welby [phone] Patient and family notified of of transfer: 10/17/23  Discharge Plan and Services Additional resources added to the After Visit Summary for   In-house Referral: NA Discharge Planning Services: CM Consult Post Acute Care Choice: NA          DME Arranged: N/A DME Agency: NA       HH Arranged: NA HH Agency: NA        Social Drivers of Health (SDOH) Interventions SDOH Screenings   Food Insecurity: No Food Insecurity (10/11/2023)  Housing: Low Risk  (10/11/2023)  Transportation Needs: No Transportation Needs (10/11/2023)  Utilities: Not At Risk (10/11/2023)  Depression (PHQ2-9): Low Risk  (04/13/2023)  Social Connections: Feeling Socially  Integrated (04/19/2023)   Received from [facility]  Tobacco Use: High Risk (10/11/2023)  Health Literacy: Adequate Health Literacy (04/19/2023)   Received from [facility]     Readmission Risk Interventions     No data to display

## 2023-10-17 NOTE — Discharge Instructions (Signed)
 Advised to follow-up with palliative care as needed. Patient being discharged to home with home hospice.

## 2023-10-17 NOTE — Discharge Summary (Signed)
 Physician Discharge Summary  Valerie Kerr FMW:[id number] DOB: 12-Jan-1961 DOA: 10/11/2023  PCP: Delbert Clam, MD  Admit date: 10/11/2023  Discharge date: 10/17/2023  Admitted From: HOME  Disposition: Home hospice ([facility])  Recommendations for Outpatient Follow-up:  Follow up with PCP in 1-2 weeks. Please obtain BMP/CBC in one week. Advised to follow-up with palliative care as needed. Patient being discharged to home with home hospice.  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  She has known progressive bony destruction of the S1 and S2 segments of the sacrum with extensive soft tissue tumor in this area, with associated severe bilateral hip/buttock/lower extremity pain.  Oncology notes indicate that she is no longer a candidate for any further systemic chemotherapy, and is not a surgical candidate. Recommendation is for comfort care, but patient is noted to have poor understanding of her disease process.  Palliative care consulted for pain management and symptom control.  Patient was agreeable with home hospice.  Patient is being discharged home with [facility] to be following.  Patient being discharged home  Discharge Diagnoses:  Principal Problem:   Cancer-related breakthrough pain from Metastatic leiomyosarcoma of the bone /Chronic pain syndrome Active Problems:   Cancer associated pain  Cancer related pain, uncontrolled due to S1 S2 destruction: She is not a candidate for any cancer treatment at this time.   Unfortunately neither IR nor anaesthesia can do extremity nerve block at [facility].   Oncology recommending comfort care.  Pain control with methadone  70 mg in the morning and 40 mg in the afternoon and 40 mg at night  , Dilaudid , decadron , lyrica .  Palliative on board for pain management.  Aggressive bowel regimen as since patient is on high-dose opioids. Palliative care consult appreciated.  Patient does not want to be discharged to SNF she wants to home with home hospice. [facility] evaluated the patient and possibly support the discharge.     Stage 4 leiomyosarcoma: Unfortunately not a candidate for any treatment at this time.    Hypokalemia: Replaced.  Continue to monitor.   Anemia of chronic disease: Mild  monitor.    Mildly elevated liver enzymes: Due to mets. Continue to mnitor     Estimated body mass index is 28.95 kg/m as calculated from the following:   Height as of this encounter: 5' 1 (1.549 m).   Weight as of this encounter: 69.5 kg.  Discharge Instructions  Discharge Instructions     Call MD for:  difficulty breathing, headache or visual disturbances   Complete by: As directed    Call MD for:  persistant dizziness or light-headedness   Complete by: As directed    Call MD for:  persistant nausea and vomiting   Complete by: As directed    Diet - low sodium heart healthy   Complete by: As directed    Diet general   Complete by: As directed    Discharge instructions   Complete by: As directed    Advised to follow-up with primary care physician in 1 week. Advised to follow-up with palliative care as needed. Patient being discharged to home with home  hospice.   Increase activity slowly   Complete by: As directed       Allergies as of 10/17/2023       Reactions   Flexeril  [cyclobenzaprine ] Other (See Comments)   Caused a lot of sedation and did not work   Codeine Itching, Rash   Fentanyl  Swelling, Rash, Other (See Comments)   Skin rash, local  swelling from patch   Tramadol Nausea Only, Other (See Comments)   GI upset,also trembling sensation        Medication List     STOP taking these medications    cetirizine  10 MG tablet Commonly known as: ZYRTEC    cyclobenzaprine  10 MG tablet Commonly known as: FLEXERIL    Oyster Shell Calcium  w/D 500-5 MG-MCG Tabs       TAKE these medications    ALPRAZolam  0.25 MG tablet Commonly known as: XANAX  Take 0.5 tablets (0.125 mg total) by mouth 2 (two) times daily as needed for up to 3 days for anxiety. What changed:  how much to take when to take this reasons to take this   Biofreeze 10 % Crea Generic drug: Menthol  (Topical Analgesic) Apply topically 3 (three) times daily as needed. What changed: reasons to take this   dexamethasone  4 MG tablet Commonly known as: DECADRON  Take 1 tablet (4 mg total) by mouth every 8 (eight) hours for 10 days.   fluticasone  50 MCG/ACT nasal spray Commonly known as: FLONASE  INSTILL 1 SPRAY INTO EACH NOSTRIL DAILY What changed: See the new instructions.   ibuprofen  600 MG tablet Commonly known as: ADVIL  Take 1 tablet (600 mg total) by mouth every 8 (eight) hours as needed for moderate pain (pain score 4-6) or mild pain (pain score 1-3).   lidocaine  5 % Commonly known as: LIDODERM  Place 1 patch onto the skin daily. Remove & Discard patch within 12 hours or as directed by MD What changed:  when to take this reasons to take this additional instructions   methadone  10 MG tablet Commonly known as: DOLOPHINE  Take 7 tablets (70 mg) in the morning, 4 tablets (40 mg) in the afternoon and 4 tablets (40 mg) in the evening What changed:  how much to take how to take this when to take this additional instructions   omeprazole  20 MG capsule Commonly known as: PRILOSEC Take 1 capsule (20 mg total) by mouth daily. Must have office visit for refills What changed:  when to take this additional instructions   ondansetron  4 MG  tablet Commonly known as: ZOFRAN  Take 1 tablet (4 mg total) by mouth every 6 (six) hours as needed for nausea.   OVER THE COUNTER MEDICATION Take 1 tablet by mouth See admin instructions. Equate Severe Sinus Congestions & Pain Caplets- Take 1 caplet by mouth every four hours as needed for sinus pain   oxycodone  30 MG immediate release tablet Commonly known as: ROXICODONE  Take 1 tablet (30 mg total) by mouth every 4 (four) hours as needed for up to 3 days. What changed:  when to take this reasons to take this   pregabalin  100 MG capsule Commonly known as: Lyrica  Take 1 capsule (100 mg total) by mouth 2 (two) times daily. What changed: when to take this   senna-docusate 8.6-50 MG tablet Commonly known as: Senokot-S Take 2 tablets by mouth at bedtime.        Follow-up Information     Delbert Clam, MD Follow up in 1 week(s).   Specialty: Family Medicine Contact information: [address]  [place] [address] [place] [address] [phone]         [facility] Follow up in 1 week(s).   Specialty: PALLIATIVE CARE Contact information: [address] 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 Multiplanar CT image reconstructions were also  generated. RADIATION DOSE REDUCTION: This exam was performed according to the departmental dose-optimization program which includes automated exposure control, adjustment of the mA and/or kV according to patient size and/or use of iterative reconstruction technique. COMPARISON:  CT scan 06/08/2023 FINDINGS: Segmentation: The lowest lumbar type non-rib-bearing vertebra is labeled as L5. Alignment: Stable 3 mm degenerative anterolisthesis at L3-4. Vertebrae: Bony destruction of the S1 and S2 segments of the sacrum with extensive soft tissue tumor in this vicinity extending into the retroperitoneal/presacral space, along the SI joints, along the sacral spinal canal, and adjacent to the piriformis musculature. The tumor right and extends cephalad in front of the L5 vertebral body and is probably extending in the epidural space of the lower lumbar spine as well. The anterior-posterior dimension of the tumor at the projected the level of the S1 superior endplate is currently 5.3 cm, previously 4.2 cm. Local tumor invasion is progressive in this tumor extends into the L5-S1 neural foraminal. Progressive sclerosis in the L5 vertebra with some indistinctness of portions of the vertebra suggesting early invasion. Again observed is a sagittally oriented fracture of the right side of the L5 vertebral body including the base of the right transverse process and extending into the right pedicle. There complete replacement of the right upper sacral ala by tumor. Erosions along the sacroiliac joints likely from SI joint tumor and involving the iliac bones and sacral ala. Calcification along presumed tumor extending in the right lateral recess and right side of the spinal canal from the mid L4 level down to the sacral mass. Paraspinal and other soft tissues: Atherosclerosis is present, including aortoiliac atherosclerotic disease. Mild right hydronephrosis and hydroureter with the right ureter coursing through the retroperitoneal  mass. Consider urology referral for potential stenting. Disc levels: L1-2: Impingement.  Mild disc bulge. L2-3: No overt impingement.  Disc bulge. L3-4: Prominent central stenosis and moderate bilateral foraminal stenosis due to disc bulge, facet arthropathy, and ligamentum flavum redundancy as well as disc uncovering. L4-5: Prominent right and moderate left foraminal stenosis and moderate central stenosis due to disc bulge, calcified mass in the right lateral recess and right neural foramen, and facet arthropathy. L5-S1: The thecal sac is completely effaced by tumor, as are the neural foramina. IMPRESSION: 1. Progressive bony destruction of the S1 and S2 segments of the sacrum with extensive soft tissue tumor in this vicinity extending into the retroperitoneal/presacral space, along the SI joints, along the sacral spinal canal, and adjacent to the piriformis musculature.  The tumor and extends cephalad in front of the L5 vertebral body and is thought to extend in the spinal canal on the right side up to the L4 vertebral level. Tumor extends into the SI joints with erosions of the adjacent portions of the iliac bones. 2. Progressive sclerosis in the L5 vertebra with some indistinctness of portions of the vertebra suggesting early invasion. 3. Again observed is a sagittally oriented fracture of the right side of the L5 vertebral body including the base of the right transverse process and extending into the right pedicle. 4. Mild right hydronephrosis and hydroureter with the right ureter coursing through the retroperitoneal mass. Consider Urology referral for potential stenting. 5. Lumbar spondylosis and degenerative disc disease, causing impingement at L3-4 and L4-5. 6.  Aortic Atherosclerosis (ICD10-I70.0). Electronically Signed   By: Ryan Salvage M.D.   On: 10/07/2023 18:56     Subjective: Patient was seen and examined at bedside.  Overnight events noted.   Patient reports feeling improved,  pain is  reasonably controlled.   She is agreeable with the [facility] following outpatient.  Discharge Exam: Vitals:   10/17/23 0527 10/17/23 1318  BP: 127/83 (!) 152/86  Pulse: 92 88  Resp: 15   Temp: 98.6 F (37 C) 98.1 F (36.7 C)  SpO2: 96% 96%   Vitals:   10/16/23 2023 10/16/23 2300 10/17/23 0527 10/17/23 1318  BP: 133/77 138/79 127/83 (!) 152/86  Pulse: 95 97 92 88  Resp: 15 18 15    Temp: 98.5 F (36.9 C)  98.6 F (37 C) 98.1 F (36.7 C)  TempSrc: Oral  Oral   SpO2: 97% 97% 96% 96%  Weight:      Height:        General: Pt is alert, awake, not in acute distress Cardiovascular: RRR, S1/S2 +, no rubs, no gallops Respiratory: CTA bilaterally, no wheezing, no rhonchi Abdominal: Soft, NT, ND, bowel sounds + Extremities: no edema, no cyanosis    The results of significant diagnostics from this hospitalization (including imaging, microbiology, ancillary and laboratory) are listed below for reference.     Microbiology: No results found for this or any previous visit (from the past 240 hours).   Labs: BNP (last 3 results) No results for input(s): BNP in the last 8760 hours. Basic Metabolic Panel: Recent Labs  Lab 10/11/23 1238 10/12/23 0436  NA 142 140  K 3.0* 4.2  CL 104 102  CO2 29 30  GLUCOSE 79 105*  BUN 24* 28*  CREATININE 0.79 0.93  CALCIUM  7.9* 9.0   Liver Function Tests: Recent Labs  Lab 10/11/23 1238  AST 44*  ALT 67*  ALKPHOS 64  BILITOT 0.3  PROT 6.6  ALBUMIN 2.9*   No results for input(s): LIPASE, AMYLASE in the last 168 hours. No results for input(s): AMMONIA in the last 168 hours. CBC: Recent Labs  Lab 10/11/23 1238 10/12/23 0436  WBC 8.8 6.9  NEUTROABS 6.8  --   HGB 11.6* 11.4*  HCT 38.5 38.5  MCV 91.9 93.9  PLT 292 282   Cardiac Enzymes: No results for input(s): CKTOTAL, CKMB, CKMBINDEX, TROPONINI in the last 168 hours. BNP: Invalid input(s): POCBNP CBG: No results for input(s): GLUCAP in the  last 168 hours. D-Dimer No results for input(s): DDIMER in the last 72 hours. Hgb A1c No results for input(s): HGBA1C in the last 72 hours. Lipid Profile No results for input(s): CHOL, HDL, LDLCALC, TRIG, CHOLHDL, LDLDIRECT in the last 72 hours. Thyroid function studies  No results for input(s): TSH, T4TOTAL, T3FREE, THYROIDAB in the last 72 hours.  Invalid input(s): FREET3 Anemia work up No results for input(s): VITAMINB12, FOLATE, FERRITIN, TIBC, IRON, RETICCTPCT in the last 72 hours. Urinalysis    Component Value Date/Time   COLORURINE AMBER (A) 06/12/2023 1027   APPEARANCEUR CLOUDY (A) 06/12/2023 1027   LABSPEC 1.011 06/12/2023 1027   LABSPEC 1.010 08/26/2016 1131   PHURINE 5.0 06/12/2023 1027   GLUCOSEU NEGATIVE 06/12/2023 1027   GLUCOSEU Negative 08/26/2016 1131   HGBUR SMALL (A) 06/12/2023 1027   BILIRUBINUR NEGATIVE 06/12/2023 1027   BILIRUBINUR Negative 08/26/2016 1131   KETONESUR NEGATIVE 06/12/2023 1027   PROTEINUR 100 (A) 06/12/2023 1027   UROBILINOGEN 0.2 08/26/2016 1131   NITRITE POSITIVE (A) 06/12/2023 1027   LEUKOCYTESUR LARGE (A) 06/12/2023 1027   LEUKOCYTESUR Trace 08/26/2016 1131   Sepsis Labs Recent Labs  Lab 10/11/23 1238 10/12/23 0436  WBC 8.8 6.9   Microbiology No results found for this or any previous visit (from the past 240 hours).   Time coordinating discharge: Over 30 minutes  SIGNED:   Darcel Dawley, MD  [facility] 10/17/2023, 2:17 PM Pager   If 7PM-7AM, please contact night-coverage

## 2023-10-17 NOTE — Progress Notes (Signed)
 TOC meds in a secure bag delivered to patient in room  by this RN

## 2023-10-27 ENCOUNTER — Other Ambulatory Visit (HOSPITAL_COMMUNITY): Payer: Self-pay

## 2023-10-27 ENCOUNTER — Other Ambulatory Visit (HOSPITAL_BASED_OUTPATIENT_CLINIC_OR_DEPARTMENT_OTHER): Payer: Self-pay

## 2023-10-27 MED ORDER — OXYCODONE HCL 30 MG PO TABS
30.0000 mg | ORAL_TABLET | ORAL | 0 refills | Status: DC | PRN
  Filled 2023-10-27: qty 30, 5d supply, fill #0

## 2023-10-30 ENCOUNTER — Other Ambulatory Visit (HOSPITAL_BASED_OUTPATIENT_CLINIC_OR_DEPARTMENT_OTHER): Payer: Self-pay

## 2023-10-30 ENCOUNTER — Other Ambulatory Visit (HOSPITAL_COMMUNITY): Payer: Self-pay

## 2023-10-30 MED ORDER — LISINOPRIL-HYDROCHLOROTHIAZIDE 10-12.5 MG PO TABS
1.0000 | ORAL_TABLET | Freq: Every day | ORAL | 0 refills | Status: DC
  Filled 2023-10-30: qty 15, 15d supply, fill #0

## 2023-10-30 MED ORDER — METHADONE HCL 10 MG PO TABS
ORAL_TABLET | ORAL | 0 refills | Status: DC
  Filled 2023-10-30: qty 105, 7d supply, fill #0

## 2023-11-02 ENCOUNTER — Encounter

## 2023-11-03 ENCOUNTER — Other Ambulatory Visit (HOSPITAL_COMMUNITY): Payer: Self-pay

## 2023-11-03 MED ORDER — OXYCODONE HCL 30 MG PO TABS
30.0000 mg | ORAL_TABLET | ORAL | 0 refills | Status: DC
  Filled 2023-11-03: qty 60, 10d supply, fill #0

## 2023-11-07 ENCOUNTER — Other Ambulatory Visit (HOSPITAL_COMMUNITY): Payer: Self-pay

## 2023-11-07 ENCOUNTER — Other Ambulatory Visit: Payer: Self-pay

## 2023-11-07 ENCOUNTER — Other Ambulatory Visit: Payer: Self-pay | Admitting: Radiation Oncology

## 2023-11-07 ENCOUNTER — Other Ambulatory Visit (HOSPITAL_BASED_OUTPATIENT_CLINIC_OR_DEPARTMENT_OTHER): Payer: Self-pay

## 2023-11-07 DIAGNOSIS — C531 Malignant neoplasm of exocervix: Secondary | ICD-10-CM

## 2023-11-07 MED ORDER — HYDROMORPHONE HCL 8 MG PO TABS
8.0000 mg | ORAL_TABLET | ORAL | 0 refills | Status: DC | PRN
  Filled 2023-11-07: qty 20, 3d supply, fill #0

## 2023-11-08 ENCOUNTER — Other Ambulatory Visit (HOSPITAL_BASED_OUTPATIENT_CLINIC_OR_DEPARTMENT_OTHER): Payer: Self-pay

## 2023-11-08 ENCOUNTER — Other Ambulatory Visit (HOSPITAL_COMMUNITY): Payer: Self-pay

## 2023-11-08 MED ORDER — OMEPRAZOLE 20 MG PO CPDR
20.0000 mg | DELAYED_RELEASE_CAPSULE | Freq: Every day | ORAL | 0 refills | Status: DC
  Filled 2023-11-08: qty 15, 15d supply, fill #0

## 2023-11-08 MED ORDER — ALPRAZOLAM 0.25 MG PO TABS
0.1250 mg | ORAL_TABLET | Freq: Two times a day (BID) | ORAL | 0 refills | Status: DC | PRN
  Filled 2023-11-08 (×2): qty 15, 15d supply, fill #0

## 2023-11-09 ENCOUNTER — Other Ambulatory Visit (HOSPITAL_BASED_OUTPATIENT_CLINIC_OR_DEPARTMENT_OTHER): Payer: Self-pay

## 2023-11-09 ENCOUNTER — Other Ambulatory Visit (HOSPITAL_COMMUNITY): Payer: Self-pay

## 2023-11-09 MED ORDER — DEXAMETHASONE 4 MG PO TABS
8.0000 mg | ORAL_TABLET | Freq: Every morning | ORAL | 0 refills | Status: DC
  Filled 2023-11-09: qty 28, 14d supply, fill #0

## 2023-11-09 MED ORDER — HYDROMORPHONE HCL 8 MG PO TABS
12.0000 mg | ORAL_TABLET | ORAL | 0 refills | Status: DC | PRN
Start: 2023-11-09 — End: 2024-02-09
  Filled 2023-11-09: qty 60, 5d supply, fill #0

## 2023-11-09 MED ORDER — METHADONE HCL 10 MG PO TABS
10.0000 mg | ORAL_TABLET | ORAL | 0 refills | Status: DC
  Filled 2023-11-09: qty 105, 7d supply, fill #0

## 2023-11-10 ENCOUNTER — Other Ambulatory Visit (HOSPITAL_COMMUNITY): Payer: Self-pay

## 2023-11-15 ENCOUNTER — Other Ambulatory Visit (HOSPITAL_COMMUNITY): Payer: Self-pay

## 2023-11-15 MED ORDER — METHADONE HCL 10 MG PO TABS
10.0000 mg | ORAL_TABLET | Freq: Three times a day (TID) | ORAL | 0 refills | Status: DC
  Filled 2023-11-15: qty 105, 7d supply, fill #0

## 2023-11-22 ENCOUNTER — Other Ambulatory Visit (HOSPITAL_COMMUNITY): Payer: Self-pay

## 2023-11-22 MED ORDER — DEXAMETHASONE 4 MG PO TABS
8.0000 mg | ORAL_TABLET | Freq: Every day | ORAL | 0 refills | Status: DC
  Filled 2023-11-22: qty 30, 15d supply, fill #0

## 2023-11-22 MED ORDER — LISINOPRIL-HYDROCHLOROTHIAZIDE 10-12.5 MG PO TABS
1.0000 | ORAL_TABLET | Freq: Every day | ORAL | 0 refills | Status: DC
  Filled 2023-11-22: qty 15, 15d supply, fill #0

## 2023-11-23 ENCOUNTER — Other Ambulatory Visit (HOSPITAL_COMMUNITY): Payer: Self-pay

## 2023-11-24 ENCOUNTER — Other Ambulatory Visit (HOSPITAL_COMMUNITY): Payer: Self-pay

## 2023-11-24 ENCOUNTER — Other Ambulatory Visit: Payer: Self-pay

## 2023-11-28 ENCOUNTER — Other Ambulatory Visit (HOSPITAL_COMMUNITY): Payer: Self-pay

## 2023-11-28 ENCOUNTER — Other Ambulatory Visit: Payer: Self-pay

## 2023-11-28 MED ORDER — METHADONE HCL 10 MG PO TABS
ORAL_TABLET | ORAL | 0 refills | Status: DC
  Filled 2023-11-28: qty 105, 7d supply, fill #0

## 2023-11-28 MED ORDER — DEXAMETHASONE 4 MG PO TABS
8.0000 mg | ORAL_TABLET | Freq: Every day | ORAL | 0 refills | Status: DC
  Filled 2023-11-28: qty 30, 15d supply, fill #0

## 2023-11-28 MED ORDER — HYDROMORPHONE HCL 8 MG PO TABS
12.0000 mg | ORAL_TABLET | ORAL | 0 refills | Status: DC
  Filled 2023-11-28: qty 60, 5d supply, fill #0

## 2023-11-29 ENCOUNTER — Other Ambulatory Visit: Payer: Self-pay

## 2023-11-29 ENCOUNTER — Other Ambulatory Visit (HOSPITAL_COMMUNITY): Payer: Self-pay

## 2023-11-29 MED ORDER — OXYCODONE HCL 30 MG PO TABS
30.0000 mg | ORAL_TABLET | Freq: Four times a day (QID) | ORAL | 0 refills | Status: DC
Start: 2023-11-28 — End: 2023-12-04
  Filled 2023-11-29: qty 40, 10d supply, fill #0

## 2023-12-01 ENCOUNTER — Other Ambulatory Visit (HOSPITAL_COMMUNITY): Payer: Self-pay

## 2023-12-04 ENCOUNTER — Other Ambulatory Visit (HOSPITAL_COMMUNITY): Payer: Self-pay

## 2023-12-04 MED ORDER — FLUTICASONE PROPIONATE 50 MCG/ACT NA SUSP
1.0000 | Freq: Every day | NASAL | 3 refills | Status: DC
  Filled 2023-12-04: qty 16, 60d supply, fill #0

## 2023-12-04 MED ORDER — OMEPRAZOLE 20 MG PO CPDR
20.0000 mg | DELAYED_RELEASE_CAPSULE | Freq: Every day | ORAL | 0 refills | Status: DC
  Filled 2023-12-04: qty 15, 15d supply, fill #0

## 2023-12-04 MED ORDER — METHADONE HCL 10 MG PO TABS
ORAL_TABLET | ORAL | 0 refills | Status: DC
  Filled 2023-12-04: qty 105, 7d supply, fill #0

## 2023-12-04 MED ORDER — OXYCODONE HCL 30 MG PO TABS
30.0000 mg | ORAL_TABLET | Freq: Four times a day (QID) | ORAL | 0 refills | Status: DC
  Filled 2023-12-04 – 2023-12-07 (×2): qty 28, 7d supply, fill #0

## 2023-12-06 ENCOUNTER — Other Ambulatory Visit (HOSPITAL_COMMUNITY): Payer: Self-pay

## 2023-12-06 ENCOUNTER — Other Ambulatory Visit (HOSPITAL_BASED_OUTPATIENT_CLINIC_OR_DEPARTMENT_OTHER): Payer: Self-pay

## 2023-12-06 MED ORDER — ALPRAZOLAM 0.25 MG PO TABS
ORAL_TABLET | ORAL | 0 refills | Status: DC
  Filled 2023-12-06: qty 30, 30d supply, fill #0

## 2023-12-06 MED ORDER — HYDROMORPHONE HCL 8 MG PO TABS
12.0000 mg | ORAL_TABLET | ORAL | 0 refills | Status: DC | PRN
  Filled 2023-12-06: qty 60, 5d supply, fill #0

## 2023-12-07 ENCOUNTER — Other Ambulatory Visit (HOSPITAL_COMMUNITY): Payer: Self-pay

## 2023-12-07 MED ORDER — PREGABALIN 100 MG PO CAPS
100.0000 mg | ORAL_CAPSULE | Freq: Three times a day (TID) | ORAL | 4 refills | Status: DC
  Filled 2023-12-07: qty 45, 15d supply, fill #0

## 2023-12-11 ENCOUNTER — Other Ambulatory Visit (HOSPITAL_COMMUNITY): Payer: Self-pay

## 2023-12-11 MED ORDER — HYDROMORPHONE HCL 8 MG PO TABS
12.0000 mg | ORAL_TABLET | ORAL | 0 refills | Status: DC
  Filled 2023-12-11: qty 60, 5d supply, fill #0

## 2023-12-11 MED ORDER — OXYCODONE HCL 30 MG PO TABS
30.0000 mg | ORAL_TABLET | Freq: Four times a day (QID) | ORAL | 0 refills | Status: DC
  Filled 2023-12-11 (×2): qty 28, 7d supply, fill #0

## 2023-12-11 MED ORDER — DEXAMETHASONE 4 MG PO TABS
8.0000 mg | ORAL_TABLET | Freq: Every day | ORAL | 3 refills | Status: DC
  Filled 2023-12-11: qty 30, 15d supply, fill #0

## 2023-12-12 ENCOUNTER — Other Ambulatory Visit (HOSPITAL_COMMUNITY): Payer: Self-pay

## 2023-12-12 MED ORDER — METHADONE HCL 10 MG PO TABS
10.0000 mg | ORAL_TABLET | ORAL | 0 refills | Status: DC
  Filled 2023-12-12: qty 105, 7d supply, fill #0

## 2023-12-12 MED ORDER — AZELASTINE HCL 0.1 % NA SOLN
1.0000 | Freq: Two times a day (BID) | NASAL | 3 refills | Status: DC
  Filled 2023-12-12: qty 30, 30d supply, fill #0

## 2023-12-13 ENCOUNTER — Other Ambulatory Visit: Payer: Self-pay

## 2023-12-13 ENCOUNTER — Other Ambulatory Visit (HOSPITAL_COMMUNITY): Payer: Self-pay

## 2023-12-15 ENCOUNTER — Other Ambulatory Visit (HOSPITAL_COMMUNITY): Payer: Self-pay

## 2023-12-15 ENCOUNTER — Other Ambulatory Visit (HOSPITAL_BASED_OUTPATIENT_CLINIC_OR_DEPARTMENT_OTHER): Payer: Self-pay

## 2023-12-15 ENCOUNTER — Other Ambulatory Visit: Payer: Self-pay

## 2023-12-15 MED ORDER — OMEPRAZOLE 20 MG PO CPDR
20.0000 mg | DELAYED_RELEASE_CAPSULE | Freq: Every day | ORAL | 0 refills | Status: DC
  Filled 2023-12-15 (×2): qty 15, 15d supply, fill #0

## 2023-12-18 ENCOUNTER — Other Ambulatory Visit: Payer: Self-pay

## 2023-12-18 ENCOUNTER — Other Ambulatory Visit (HOSPITAL_COMMUNITY): Payer: Self-pay

## 2023-12-18 MED ORDER — HYDROMORPHONE HCL 8 MG PO TABS
12.0000 mg | ORAL_TABLET | ORAL | 0 refills | Status: DC | PRN
  Filled 2023-12-18: qty 60, 5d supply, fill #0

## 2023-12-18 MED ORDER — OXYCODONE HCL 30 MG PO TABS
30.0000 mg | ORAL_TABLET | Freq: Four times a day (QID) | ORAL | 0 refills | Status: DC
  Filled 2023-12-18: qty 28, 7d supply, fill #0

## 2023-12-18 MED ORDER — METHADONE HCL 10 MG PO TABS
ORAL_TABLET | ORAL | 0 refills | Status: DC
  Filled 2023-12-18: qty 105, 7d supply, fill #0

## 2023-12-21 ENCOUNTER — Other Ambulatory Visit (HOSPITAL_COMMUNITY): Payer: Self-pay

## 2023-12-25 ENCOUNTER — Other Ambulatory Visit (HOSPITAL_COMMUNITY): Payer: Self-pay

## 2023-12-25 MED ORDER — OMEPRAZOLE 20 MG PO CPDR
20.0000 mg | DELAYED_RELEASE_CAPSULE | Freq: Every day | ORAL | 3 refills | Status: DC
  Filled 2023-12-25: qty 15, 15d supply, fill #0

## 2023-12-25 MED ORDER — HYDROMORPHONE HCL 8 MG PO TABS
8.0000 mg | ORAL_TABLET | ORAL | 0 refills | Status: DC | PRN
  Filled 2023-12-25: qty 60, 8d supply, fill #0

## 2023-12-25 MED ORDER — DEXAMETHASONE 4 MG PO TABS
8.0000 mg | ORAL_TABLET | Freq: Every day | ORAL | 3 refills | Status: DC
  Filled 2023-12-25: qty 30, 15d supply, fill #0

## 2023-12-25 MED ORDER — OXYCODONE HCL 30 MG PO TABS
30.0000 mg | ORAL_TABLET | Freq: Four times a day (QID) | ORAL | 0 refills | Status: DC
  Filled 2023-12-25: qty 28, 7d supply, fill #0

## 2024-01-03 ENCOUNTER — Other Ambulatory Visit (HOSPITAL_COMMUNITY): Payer: Self-pay

## 2024-01-03 MED ORDER — OXYCODONE HCL 30 MG PO TABS
30.0000 mg | ORAL_TABLET | Freq: Four times a day (QID) | ORAL | 0 refills | Status: DC
  Filled 2024-01-03: qty 40, 10d supply, fill #0

## 2024-01-03 MED ORDER — HYDROMORPHONE HCL 8 MG PO TABS
12.0000 mg | ORAL_TABLET | ORAL | 0 refills | Status: DC
  Filled 2024-01-03: qty 20, 2d supply, fill #0
  Filled 2024-01-03: qty 40, 3d supply, fill #0

## 2024-01-05 ENCOUNTER — Other Ambulatory Visit (HOSPITAL_COMMUNITY): Payer: Self-pay

## 2024-01-05 MED ORDER — DEXAMETHASONE 4 MG PO TABS
8.0000 mg | ORAL_TABLET | Freq: Every day | ORAL | 3 refills | Status: DC
  Filled 2024-01-05: qty 30, 15d supply, fill #0

## 2024-01-06 ENCOUNTER — Other Ambulatory Visit (HOSPITAL_COMMUNITY): Payer: Self-pay

## 2024-01-06 MED ORDER — ALPRAZOLAM 0.25 MG PO TABS
0.5000 mg | ORAL_TABLET | Freq: Two times a day (BID) | ORAL | 0 refills | Status: DC | PRN
  Filled 2024-01-06: qty 30, 8d supply, fill #0

## 2024-01-06 MED ORDER — METHADONE HCL 10 MG PO TABS
ORAL_TABLET | ORAL | 0 refills | Status: DC
  Filled 2024-01-06: qty 105, 7d supply, fill #0

## 2024-01-10 ENCOUNTER — Other Ambulatory Visit (HOSPITAL_COMMUNITY): Payer: Self-pay

## 2024-01-10 MED ORDER — OXYCODONE HCL 30 MG PO TABS
45.0000 mg | ORAL_TABLET | Freq: Four times a day (QID) | ORAL | 0 refills | Status: DC
  Filled 2024-01-11: qty 36, 6d supply, fill #0

## 2024-01-10 MED ORDER — BIOFREEZE 10 % EX CREA
TOPICAL_CREAM | CUTANEOUS | 2 refills | Status: DC
  Filled 2024-01-10: qty 85, 30d supply, fill #0

## 2024-01-10 MED ORDER — HYDROMORPHONE HCL 8 MG PO TABS
16.0000 mg | ORAL_TABLET | ORAL | 0 refills | Status: DC | PRN
  Filled 2024-01-10: qty 10, 1d supply, fill #0
  Filled 2024-01-10: qty 112, 7d supply, fill #0
  Filled 2024-01-10: qty 102, 6d supply, fill #0

## 2024-01-10 MED ORDER — METHADONE HCL 10 MG PO TABS
ORAL_TABLET | ORAL | 0 refills | Status: DC
  Filled 2024-01-10: qty 75, 5d supply, fill #0

## 2024-01-10 MED ORDER — OMEPRAZOLE 20 MG PO CPDR
20.0000 mg | DELAYED_RELEASE_CAPSULE | Freq: Every day | ORAL | 3 refills | Status: AC
  Filled 2024-01-10: qty 15, 15d supply, fill #0

## 2024-01-11 ENCOUNTER — Other Ambulatory Visit (HOSPITAL_COMMUNITY): Payer: Self-pay

## 2024-01-11 ENCOUNTER — Other Ambulatory Visit: Payer: Self-pay

## 2024-01-12 ENCOUNTER — Other Ambulatory Visit (HOSPITAL_COMMUNITY): Payer: Self-pay

## 2024-01-17 ENCOUNTER — Other Ambulatory Visit (HOSPITAL_COMMUNITY): Payer: Self-pay

## 2024-01-18 ENCOUNTER — Other Ambulatory Visit: Payer: Self-pay

## 2024-01-18 ENCOUNTER — Other Ambulatory Visit (HOSPITAL_COMMUNITY): Payer: Self-pay

## 2024-01-18 MED ORDER — OXYCODONE HCL 30 MG PO TABS
30.0000 mg | ORAL_TABLET | Freq: Four times a day (QID) | ORAL | 0 refills | Status: DC
  Filled 2024-01-18: qty 36, 6d supply, fill #0

## 2024-01-18 MED ORDER — HYDROMORPHONE HCL 8 MG PO TABS
16.0000 mg | ORAL_TABLET | ORAL | 0 refills | Status: DC | PRN
  Filled 2024-01-18: qty 30, 2d supply, fill #0

## 2024-01-19 ENCOUNTER — Other Ambulatory Visit (HOSPITAL_COMMUNITY): Payer: Self-pay

## 2024-01-19 MED ORDER — POLYETHYLENE GLYCOL 3350 17 GM/SCOOP PO POWD
17.0000 g | Freq: Every day | ORAL | 0 refills | Status: DC
  Filled 2024-01-19: qty 238, 14d supply, fill #0

## 2024-01-22 ENCOUNTER — Other Ambulatory Visit (HOSPITAL_COMMUNITY): Payer: Self-pay

## 2024-01-23 ENCOUNTER — Other Ambulatory Visit (HOSPITAL_COMMUNITY): Payer: Self-pay

## 2024-01-23 MED ORDER — SENNOSIDES-DOCUSATE SODIUM 8.6-50 MG PO TABS
2.0000 | ORAL_TABLET | Freq: Every evening | ORAL | 0 refills | Status: DC | PRN
  Filled 2024-01-23: qty 30, 15d supply, fill #0

## 2024-01-23 MED ORDER — BISACODYL 10 MG RE SUPP
10.0000 mg | Freq: Every day | RECTAL | 0 refills | Status: DC
  Filled 2024-01-23: qty 5, 15d supply, fill #0

## 2024-01-25 ENCOUNTER — Other Ambulatory Visit (HOSPITAL_COMMUNITY): Payer: Self-pay

## 2024-01-25 MED ORDER — METHADONE HCL 10 MG PO TABS
ORAL_TABLET | ORAL | 0 refills | Status: DC
  Filled 2024-01-25: qty 105, 5d supply, fill #0

## 2024-01-30 ENCOUNTER — Other Ambulatory Visit (HOSPITAL_COMMUNITY): Payer: Self-pay

## 2024-02-03 ENCOUNTER — Other Ambulatory Visit (HOSPITAL_COMMUNITY): Payer: Self-pay

## 2024-02-03 MED ORDER — METHADONE HCL 10 MG PO TABS
ORAL_TABLET | ORAL | 0 refills | Status: DC
  Filled 2024-02-03: qty 105, 6d supply, fill #0

## 2024-02-04 ENCOUNTER — Other Ambulatory Visit (HOSPITAL_COMMUNITY): Payer: Self-pay

## 2024-02-05 ENCOUNTER — Other Ambulatory Visit (HOSPITAL_COMMUNITY): Payer: Self-pay

## 2024-02-06 ENCOUNTER — Other Ambulatory Visit (HOSPITAL_COMMUNITY): Payer: Self-pay

## 2024-02-06 MED ORDER — HYDROMORPHONE HCL 8 MG PO TABS
16.0000 mg | ORAL_TABLET | ORAL | 0 refills | Status: DC | PRN
  Filled 2024-02-06: qty 6, 1d supply, fill #0

## 2024-02-06 MED ORDER — ALPRAZOLAM 0.25 MG PO TABS
0.2500 mg | ORAL_TABLET | ORAL | 0 refills | Status: DC | PRN
  Filled 2024-02-06: qty 3, 1d supply, fill #0

## 2024-02-06 MED ORDER — OXYCODONE HCL 30 MG PO TABS
45.0000 mg | ORAL_TABLET | Freq: Four times a day (QID) | ORAL | 0 refills | Status: DC | PRN
  Filled 2024-02-06: qty 3, 1d supply, fill #0

## 2024-02-08 ENCOUNTER — Encounter (HOSPITAL_COMMUNITY): Payer: Self-pay

## 2024-02-08 ENCOUNTER — Inpatient Hospital Stay (HOSPITAL_COMMUNITY)
Admission: EM | Admit: 2024-02-08 | Discharge: 2024-02-16 | DRG: 690 | Disposition: A | Discharge status: Hospice | Attending: Internal Medicine | Admitting: Internal Medicine

## 2024-02-08 ENCOUNTER — Other Ambulatory Visit: Payer: Self-pay

## 2024-02-08 ENCOUNTER — Emergency Department (HOSPITAL_COMMUNITY)

## 2024-02-08 DIAGNOSIS — C499 Malignant neoplasm of connective and soft tissue, unspecified: Secondary | ICD-10-CM | POA: Diagnosis present

## 2024-02-08 DIAGNOSIS — N179 Acute kidney failure, unspecified: Secondary | ICD-10-CM | POA: Diagnosis present

## 2024-02-08 DIAGNOSIS — Z66 Do not resuscitate: Secondary | ICD-10-CM | POA: Diagnosis present

## 2024-02-08 DIAGNOSIS — Z8541 Personal history of malignant neoplasm of cervix uteri: Secondary | ICD-10-CM

## 2024-02-08 DIAGNOSIS — N134 Hydroureter: Secondary | ICD-10-CM | POA: Diagnosis present

## 2024-02-08 DIAGNOSIS — F419 Anxiety disorder, unspecified: Secondary | ICD-10-CM | POA: Diagnosis present

## 2024-02-08 DIAGNOSIS — E11649 Type 2 diabetes mellitus with hypoglycemia without coma: Secondary | ICD-10-CM | POA: Diagnosis not present

## 2024-02-08 DIAGNOSIS — I10 Essential (primary) hypertension: Secondary | ICD-10-CM | POA: Diagnosis present

## 2024-02-08 DIAGNOSIS — F1120 Opioid dependence, uncomplicated: Secondary | ICD-10-CM | POA: Diagnosis present

## 2024-02-08 DIAGNOSIS — Z885 Allergy status to narcotic agent status: Secondary | ICD-10-CM

## 2024-02-08 DIAGNOSIS — Z923 Personal history of irradiation: Secondary | ICD-10-CM | POA: Diagnosis not present

## 2024-02-08 DIAGNOSIS — R627 Adult failure to thrive: Secondary | ICD-10-CM | POA: Diagnosis present

## 2024-02-08 DIAGNOSIS — N136 Pyonephrosis: Principal | ICD-10-CM | POA: Diagnosis present

## 2024-02-08 DIAGNOSIS — Z79899 Other long term (current) drug therapy: Secondary | ICD-10-CM | POA: Diagnosis not present

## 2024-02-08 DIAGNOSIS — N1330 Unspecified hydronephrosis: Secondary | ICD-10-CM | POA: Diagnosis not present

## 2024-02-08 DIAGNOSIS — Z515 Encounter for palliative care: Secondary | ICD-10-CM | POA: Diagnosis not present

## 2024-02-08 DIAGNOSIS — E440 Moderate protein-calorie malnutrition: Secondary | ICD-10-CM | POA: Diagnosis present

## 2024-02-08 DIAGNOSIS — N390 Urinary tract infection, site not specified: Secondary | ICD-10-CM | POA: Diagnosis not present

## 2024-02-08 DIAGNOSIS — E785 Hyperlipidemia, unspecified: Secondary | ICD-10-CM | POA: Diagnosis present

## 2024-02-08 DIAGNOSIS — N139 Obstructive and reflux uropathy, unspecified: Secondary | ICD-10-CM | POA: Diagnosis present

## 2024-02-08 DIAGNOSIS — K5900 Constipation, unspecified: Secondary | ICD-10-CM | POA: Diagnosis not present

## 2024-02-08 DIAGNOSIS — Z7401 Bed confinement status: Secondary | ICD-10-CM

## 2024-02-08 DIAGNOSIS — C7951 Secondary malignant neoplasm of bone: Secondary | ICD-10-CM | POA: Diagnosis present

## 2024-02-08 DIAGNOSIS — G894 Chronic pain syndrome: Secondary | ICD-10-CM | POA: Diagnosis present

## 2024-02-08 DIAGNOSIS — T451X5A Adverse effect of antineoplastic and immunosuppressive drugs, initial encounter: Secondary | ICD-10-CM | POA: Diagnosis present

## 2024-02-08 DIAGNOSIS — Z555 Less than a high school diploma: Secondary | ICD-10-CM

## 2024-02-08 DIAGNOSIS — K219 Gastro-esophageal reflux disease without esophagitis: Secondary | ICD-10-CM | POA: Diagnosis present

## 2024-02-08 DIAGNOSIS — E1169 Type 2 diabetes mellitus with other specified complication: Secondary | ICD-10-CM | POA: Diagnosis present

## 2024-02-08 DIAGNOSIS — G893 Neoplasm related pain (acute) (chronic): Secondary | ICD-10-CM | POA: Diagnosis present

## 2024-02-08 DIAGNOSIS — K5903 Drug induced constipation: Secondary | ICD-10-CM | POA: Diagnosis not present

## 2024-02-08 DIAGNOSIS — D6481 Anemia due to antineoplastic chemotherapy: Secondary | ICD-10-CM | POA: Diagnosis present

## 2024-02-08 DIAGNOSIS — D630 Anemia in neoplastic disease: Secondary | ICD-10-CM | POA: Diagnosis present

## 2024-02-08 DIAGNOSIS — B9620 Unspecified Escherichia coli [E. coli] as the cause of diseases classified elsewhere: Secondary | ICD-10-CM | POA: Diagnosis present

## 2024-02-08 DIAGNOSIS — F17210 Nicotine dependence, cigarettes, uncomplicated: Secondary | ICD-10-CM | POA: Diagnosis present

## 2024-02-08 DIAGNOSIS — E876 Hypokalemia: Secondary | ICD-10-CM | POA: Diagnosis present

## 2024-02-08 DIAGNOSIS — R319 Hematuria, unspecified: Secondary | ICD-10-CM | POA: Diagnosis not present

## 2024-02-08 DIAGNOSIS — Z6828 Body mass index (BMI) 28.0-28.9, adult: Secondary | ICD-10-CM

## 2024-02-08 DIAGNOSIS — C779 Secondary and unspecified malignant neoplasm of lymph node, unspecified: Secondary | ICD-10-CM | POA: Diagnosis present

## 2024-02-08 DIAGNOSIS — R748 Abnormal levels of other serum enzymes: Secondary | ICD-10-CM

## 2024-02-08 DIAGNOSIS — Z7189 Other specified counseling: Secondary | ICD-10-CM | POA: Diagnosis not present

## 2024-02-08 DIAGNOSIS — R1030 Lower abdominal pain, unspecified: Principal | ICD-10-CM

## 2024-02-08 LAB — CBC
HCT: 35.1 % — ABNORMAL LOW (ref 36.0–46.0)
Hemoglobin: 10.8 g/dL — ABNORMAL LOW (ref 12.0–15.0)
MCH: 27.3 pg (ref 26.0–34.0)
MCHC: 30.8 g/dL (ref 30.0–36.0)
MCV: 88.9 fL (ref 80.0–100.0)
Platelets: 388 K/uL (ref 150–400)
RBC: 3.95 MIL/uL (ref 3.87–5.11)
RDW: 15.5 % (ref 11.5–15.5)
WBC: 22.4 K/uL — ABNORMAL HIGH (ref 4.0–10.5)
nRBC: 0 % (ref 0.0–0.2)

## 2024-02-08 LAB — URINALYSIS, W/ REFLEX TO CULTURE (INFECTION SUSPECTED)
Bilirubin Urine: NEGATIVE
Glucose, UA: NEGATIVE mg/dL
Ketones, ur: NEGATIVE mg/dL
Nitrite: NEGATIVE
Protein, ur: 100 mg/dL — AB
Specific Gravity, Urine: 1.013 (ref 1.005–1.030)
WBC, UA: >50 WBC/hpf (ref 0–5)
pH: 6 (ref 5.0–8.0)

## 2024-02-08 LAB — COMPREHENSIVE METABOLIC PANEL WITH GFR
ALT: 10 U/L (ref 0–44)
AST: 22 U/L (ref 15–41)
Albumin: 2.5 g/dL — ABNORMAL LOW (ref 3.5–5.0)
Alkaline Phosphatase: 236 U/L — ABNORMAL HIGH (ref 38–126)
Anion gap: 13 (ref 5–15)
BUN: 22 mg/dL (ref 8–23)
CO2: 25 mmol/L (ref 22–32)
Calcium: 9.7 mg/dL (ref 8.9–10.3)
Chloride: 98 mmol/L (ref 98–111)
Creatinine, Ser: 1.03 mg/dL — ABNORMAL HIGH (ref 0.44–1.00)
GFR, Estimated: >60 mL/min (ref >60)
Glucose, Bld: 91 mg/dL (ref 70–99)
Potassium: 3.5 mmol/L (ref 3.5–5.1)
Sodium: 137 mmol/L (ref 135–145)
Total Bilirubin: 1.1 mg/dL (ref 0.0–1.2)
Total Protein: 6.8 g/dL (ref 6.5–8.1)

## 2024-02-08 LAB — HEMOGLOBIN A1C
Hgb A1c MFr Bld: 10.2 % — ABNORMAL HIGH (ref 4.8–5.6)
Mean Plasma Glucose: 246.04 mg/dL

## 2024-02-08 LAB — GLUCOSE, CAPILLARY
Glucose-Capillary: 68 mg/dL — ABNORMAL LOW (ref 70–99)
Glucose-Capillary: 72 mg/dL (ref 70–99)
Glucose-Capillary: 125 mg/dL — ABNORMAL HIGH (ref 70–99)
Glucose-Capillary: 123 mg/dL — ABNORMAL HIGH (ref 70–99)

## 2024-02-08 LAB — CBG MONITORING, ED: Glucose-Capillary: 83 mg/dL (ref 70–99)

## 2024-02-08 MED ORDER — DEXTROSE 50 % IV SOLN
50.0000 mL | INTRAVENOUS | Status: DC | PRN
  Administered 2024-02-11: 50 mL via INTRAVENOUS
  Filled 2024-02-08: qty 50

## 2024-02-08 MED ORDER — SODIUM CHLORIDE 0.9 % IV SOLN
2.0000 g | Freq: Once | INTRAVENOUS | Status: AC
  Administered 2024-02-08: 2 g via INTRAVENOUS
  Filled 2024-02-08: qty 20

## 2024-02-08 MED ORDER — HYDROMORPHONE HCL 1 MG/ML IJ SOLN
2.0000 mg | Freq: Once | INTRAMUSCULAR | Status: AC
  Administered 2024-02-08: 2 mg via INTRAVENOUS
  Filled 2024-02-08: qty 2

## 2024-02-08 MED ORDER — SODIUM CHLORIDE 0.9 % IV BOLUS
1000.0000 mL | Freq: Once | INTRAVENOUS | Status: AC
  Administered 2024-02-08: 1000 mL via INTRAVENOUS

## 2024-02-08 MED ORDER — ACETAMINOPHEN 325 MG PO TABS: 650.0000 mg | ORAL_TABLET | Freq: Four times a day (QID) | ORAL | Status: DC | PRN

## 2024-02-08 MED ORDER — SODIUM CHLORIDE 0.9 % IV BOLUS: 1000.0000 mL | Freq: Once | INTRAVENOUS | Status: DC

## 2024-02-08 MED ORDER — ONDANSETRON HCL 4 MG/2ML IJ SOLN
4.0000 mg | Freq: Four times a day (QID) | INTRAMUSCULAR | Status: DC | PRN
  Administered 2024-02-16: 4 mg via INTRAVENOUS
  Filled 2024-02-08: qty 2

## 2024-02-08 MED ORDER — KCL IN DEXTROSE-NACL 20-5-0.9 MEQ/L-%-% IV SOLN
INTRAVENOUS | Status: AC
  Filled 2024-02-08 (×2): qty 1000

## 2024-02-08 MED ORDER — ACETAMINOPHEN 650 MG RE SUPP
650.0000 mg | Freq: Four times a day (QID) | RECTAL | Status: DC | PRN
Start: 2024-02-08 — End: 2024-02-16

## 2024-02-08 MED ORDER — ENOXAPARIN SODIUM 40 MG/0.4ML IJ SOSY
40.0000 mg | PREFILLED_SYRINGE | INTRAMUSCULAR | Status: DC
  Administered 2024-02-08 – 2024-02-15 (×8): 40 mg via SUBCUTANEOUS
  Filled 2024-02-08 (×8): qty 0.4

## 2024-02-08 MED ORDER — HYDROMORPHONE HCL 1 MG/ML IJ SOLN
2.0000 mg | INTRAMUSCULAR | Status: DC | PRN
  Administered 2024-02-08 – 2024-02-13 (×15): 2 mg via INTRAVENOUS
  Filled 2024-02-08 (×16): qty 2

## 2024-02-08 MED ORDER — IOHEXOL 300 MG/ML  SOLN
100.0000 mL | Freq: Once | INTRAMUSCULAR | Status: AC | PRN
  Administered 2024-02-08: 100 mL via INTRAVENOUS

## 2024-02-08 MED ORDER — ALPRAZOLAM 0.5 MG PO TABS
0.5000 mg | ORAL_TABLET | Freq: Once | ORAL | Status: AC
  Administered 2024-02-08: 0.5 mg via ORAL
  Filled 2024-02-08: qty 1

## 2024-02-08 MED ORDER — CEFTRIAXONE SODIUM 1 G IJ SOLR
1.0000 g | INTRAMUSCULAR | Status: AC
  Administered 2024-02-09 – 2024-02-14 (×6): 1 g via INTRAVENOUS
  Filled 2024-02-08 (×6): qty 10

## 2024-02-08 MED ORDER — ONDANSETRON HCL 4 MG PO TABS: 4.0000 mg | ORAL_TABLET | Freq: Four times a day (QID) | ORAL | Status: DC | PRN

## 2024-02-08 NOTE — ED Notes (Signed)
 Patient resting at this time - pain medication at a therapeutic level. CBG test deferred at this time.

## 2024-02-08 NOTE — ED Notes (Signed)
 Patient transported to CT

## 2024-02-08 NOTE — ED Provider Notes (Signed)
 [facility] AT [facility] Provider Note   CSN: [id number] Arrival date & time: 02/08/24  9694     Patient presents with: Abdominal Pain   Valerie Kerr is a 63 y.o. female.   The history is provided by the patient.  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  She denies nausea or vomiting and denies fever or chills.  She denies urinary urgency or frequency or dysuria.  She has been taking Dolophine , oxycodone , hydromorphone  for pain and not getting relief.    Prior to Admission medications   Medication Sig Start Date End Date Taking? Authorizing Provider  ALPRAZolam  (XANAX ) 0.25 MG tablet Take 0.5 tablets (0.125 mg total) by mouth 2 (two) times daily as needed. 11/08/23     ALPRAZolam  (XANAX ) 0.25 MG tablet Take 1/2 tablet by mouth 2 times daily as needed 12/06/23     ALPRAZolam  (XANAX ) 0.25 MG tablet Take 2 tablets (0.5 mg total) by mouth every 12 (twelve) hours as needed for anxiety. 01/05/24     ALPRAZolam  (XANAX ) 0.25 MG tablet Take 1 tablet by mouth every four hours as needed for anxiety 02/06/24     azelastine  (ASTELIN ) 0.1 % nasal spray Spray 1 spray into both nostrils twice a day as needed for allergies 12/12/23     bisacodyl  (DULCOLAX) 10 MG suppository Place 1 suppository (10 mg total) rectally at bedtime if patient has not had a bowel movement for 3 days.  (constipation) 01/23/24     dexamethasone  (DECADRON ) 4 MG tablet Take 2 tablets (8 mg total) by mouth daily. 12/11/23     dexamethasone  (DECADRON ) 4 MG tablet Take 2 tablets (8 mg total) by mouth daily. 12/25/23     dexamethasone  (DECADRON ) 4 MG tablet Take 2 tablets (8 mg total) by mouth daily for pain 01/05/24     fluticasone  (FLONASE ) 50 MCG/ACT nasal spray INSTILL 1 SPRAY INTO EACH  NOSTRIL DAILY Patient taking differently: Place 1 spray into both nostrils daily as needed for allergies or rhinitis. 04/09/19   Newlin, Enobong, MD  fluticasone  (FLONASE ) 50 MCG/ACT nasal spray Place 1 spray into both nostrils daily. 12/04/23     HYDROmorphone  (DILAUDID ) 8 MG tablet Take 1.5 tablet by mouth every three hours as needed for pain or shortness of breath. 11/09/23     HYDROmorphone  (DILAUDID ) 8 MG tablet Take 1.5 tablets (12 mg total) by mouth every 3 (three) hours as needed for pain or dyspnea 11/28/23     HYDROmorphone  (DILAUDID ) 8 MG tablet Take 1.5 tablets (12 mg total) by mouth every 3 (three) hours as needed for pain or dyspnea 12/06/23     HYDROmorphone  (DILAUDID ) 8 MG tablet Take 1.5 tablets (12 mg total) by mouth every 3 (three) hours 12/11/23     HYDROmorphone  (DILAUDID ) 8 MG tablet Take 1 tablet (8 mg total) by mouth every 3 (three) hours as needed for pain or shortness of breath 12/25/23     HYDROmorphone  (DILAUDID ) 8 MG tablet Take 1.5 tablets (12 mg total) by mouth every 3 (three) hours as needed for pain or shortness of breath. 01/03/24     HYDROmorphone  (DILAUDID ) 8 MG tablet Take 2 tablet by mouth every three hours as needed for pain or shortness of breath 02/06/24     ibuprofen  (ADVIL ) 600 MG tablet  Take 1 tablet (600 mg total) by mouth every 8 (eight) hours as needed for moderate pain (pain score 4-6) or mild pain (pain score 1-3). 09/25/23   Pickenpack-Cousar, Athena N, NP  lidocaine  (LIDODERM ) 5 % Place 1 patch onto the skin daily. Remove & Discard patch within 12 hours or as directed by MD Patient taking differently: Place 1 patch onto the skin daily as needed (for pain- Remove & Discard patch within 12 hours or as directed by MD). 08/07/23   Pickenpack-Cousar, Athena N, NP  lisinopril -hydrochlorothiazide  (ZESTORETIC ) 10-12.5 MG tablet Take 1 tablet by mouth daily for hypertension. 11/22/23     Menthol , Topical Analgesic, (BIOFREEZE) 10 % CREA Apply topically 3 (three) times daily  as needed. Patient taking differently: Apply 1 Application topically 3 (three) times daily as needed (for pain). 08/07/23   Pickenpack-Cousar, Athena N, NP  Menthol , Topical Analgesic, (BIOFREEZE) 10 % CREA Apply topically as needed every 8 Hours for pain. 01/10/24     methadone  (DOLOPHINE ) 10 MG tablet Take 7 tablets (70 mg) in the morning, 4 tablets (40 mg) in the afternoon and 4 tablets (40 mg) in the evening 10/17/23   Leotis Bogus, MD  methadone  (DOLOPHINE ) 10 MG tablet Take 7 tablets (70mg ) in the morning, 4 tablets (40mg ) in the afternoon and 4 tablets (40mg ) in the evening 10/30/23     methadone  (DOLOPHINE ) 10 MG tablet Take 7 tablets (70mg ) by mouth in the morning, 4 tablets (40mg ) in the afternoon and 4 tablets (40mg ) in the evening as directed for pain. 11/09/23     methadone  (DOLOPHINE ) 10 MG tablet Take 7 tablets (70mg ) by mouth in the morning, 4 tablets (40mg ) in the afternoon and 4 tablets (40mg ) in the evening. 11/14/23     methadone  (DOLOPHINE ) 10 MG tablet Take 7 tablets (70mg ) in the morning, 4 tablets (40mg ) in the afternoon and 4 tablets (40mg ) in the evening 12/12/23     methadone  (DOLOPHINE ) 10 MG tablet Take 7 tablets (70mg ) in the morning, 7 tablets (70mg ) at 2pm, and 4 tablets at bedtime 02/03/24     omeprazole  (PRILOSEC) 20 MG capsule Take 1 capsule (20 mg total) by mouth daily. Must have office visit for refills Patient taking differently: Take 20 mg by mouth daily before breakfast. 05/02/19   Delbert Clam, MD  omeprazole  (PRILOSEC) 20 MG capsule Take 1 capsule (20 mg total) by mouth daily. 12/25/23     omeprazole  (PRILOSEC) 20 MG capsule Take 1 capsule (20 mg total) by mouth daily for GERD. 01/10/24     ondansetron  (ZOFRAN ) 4 MG tablet Take 1 tablet (4 mg total) by mouth every 6 (six) hours as needed for nausea. 10/10/23   Will Almarie MATSU, MD  OVER THE COUNTER MEDICATION Take 1 tablet by mouth See admin instructions. Equate Severe Sinus Congestions & Pain Caplets- Take 1  caplet by mouth every four hours as needed for sinus pain    [provider]  oxycodone  (ROXICODONE ) 30 MG immediate release tablet Take 1 tablet (30 mg total) every 4 hours by mouth as needed. 10/27/23     oxycodone  (ROXICODONE ) 30 MG immediate release tablet Take 1 tablet (30 mg total) by mouth every 4 (four) hours. 11/03/23     oxycodone  (ROXICODONE ) 30 MG immediate release tablet Take 1.5 tablets by mouth every 6 hours for pain/dyspnea 01/18/24     oxycodone  (ROXICODONE ) 30 MG immediate release tablet Take 1.5 tablet by mouth every six hours as needed for pain or shortness of breath  02/06/24     polyethylene glycol powder (MIRALAX ) 17 GM/SCOOP powder Take 1 scoop in 8 oz of liquid one time daily as needed for constipation 01/19/24     pregabalin  (LYRICA ) 100 MG capsule Take 1 capsule (100 mg total) by mouth 3 (three) times daily for neuropathy 12/07/23     senna-docusate (EQ STOOL SOFTENER/LAXATIVE) 8.6-50 MG tablet Take 2 tablets by mouth at bedtime as needed for constipation 01/23/24     senna-docusate (SENOKOT-S) 8.6-50 MG tablet Take 2 tablets by mouth at bedtime. 10/10/23   Will Almarie MATSU, MD    Allergies: Flexeril  [cyclobenzaprine ], Codeine, Fentanyl , and Tramadol    Review of Systems  Gastrointestinal:  Positive for abdominal pain.  All other systems reviewed and are negative.   Updated Vital Signs BP 126/76   Pulse (!) 118   Temp 98.4 F (36.9 C) (Oral)   Resp 14   SpO2 95%   Physical Exam Vitals and nursing note reviewed.   63 year old female, resting comfortably and in no acute distress. Vital signs are significant for rapid heart rate. Oxygen saturation is 95%, which is normal. Head is normocephalic and atraumatic. PERRLA, EOMI.  Lungs are clear without rales, wheezes, or rhonchi. Chest is nontender. Heart has regular rate and rhythm without murmur. Abdomen is soft, flat, with marked tenderness across the lower abdomen.  There is a sense of a mass across the  lower abdomen which is very tender.  There is no rebound or guarding. Extremities have no cyanosis or edema,. Skin is warm and dry without rash. Neurologic: Awake and alert.  (all labs ordered are listed, but only abnormal results are displayed) Labs Reviewed  COMPREHENSIVE METABOLIC PANEL WITH GFR  CBC    EKG: None  Radiology: No results found.   Procedures   Medications Ordered in the ED - No data to display                                  Medical Decision Making Amount and/or Complexity of Data Reviewed Labs: ordered. Radiology: ordered.  Risk Prescription drug management.   Lower abdominal pain.  This a presentation with a wide range of treatment options and carries with a high risk of morbidity and complications.  Differential diagnosis includes, but is not limited to, ovarian cyst, urinary tract infection, urolithiasis, pyelonephritis, diverticulitis, perforated viscus.  I have ordered hydromorphone  for pain and I have ordered IV fluids as well as screening labs of CBC and comprehensive metabolic panel and urinalysis.  I have reviewed her past records, and note hospitalization 10/11/2023-10/17/2023 for uncontrolled cancer related pain.  Per oncology, she has completed radiation therapy and is not a candidate for chemotherapy.  I have reviewed her laboratory tests, and my interpretation is UTI, chronically elevated alkaline phosphatase, borderline elevated serum creatinine, marked leukocytosis, anemia showing a very slow downward trend.  I have ordered a dose of ceftriaxone.  I have reevaluated the patient.  She was resting comfortably, but still had marked tenderness to palpation in her lower abdomen.  I have ordered CT of abdomen and pelvis.  I suspect she will need to be admitted for pain control.  Case is signed out to Dr. Patsey.     Final diagnoses:  Lower abdominal pain  Urinary tract infection with hematuria, site unspecified  Elevated alkaline phosphatase  level    ED Discharge Orders     None  Raford Lenis, MD 02/08/24 [phone]

## 2024-02-08 NOTE — Plan of Care (Signed)
 Long conversation had with patient's niece, 2 daughters, multiple family members joined by phone, and primary MD Dr. Celinda.  We reviewed the patient's wishes as they may be aware of them.  She expressed earlier today that she did not wish to continue with more chemotherapy.  However she is significantly sedated and lacks competency to speak to end-of-life care at this time.  We discussed a palliative direction, since she just left hospice.  This would avoid PCN tube placement and focus purely on comfort measures.  A third moderate option would include placing a percutaneous nephrostomy tube for the purposes of decompressing the kidney and proceeding with a course of antibiotics prior to transferring back home to [place]  and reentering hospice care.  A third option would be aggressive and curative measures.  This would require input from oncology as to what if any options she had.  With her level of debilitation and disease advancement, this is unlikely.  The shared decision was made to dial back her pain medications enough to where she could have a coherent conversation with her daughter.  If she is suffering excessively, they will continue to treat her as necessary and family/POA will make a decision tomorrow morning.  Urology will continue to follow.

## 2024-02-08 NOTE — ED Notes (Signed)
 Repeat temperature deferred for patient comfort at this time.

## 2024-02-08 NOTE — H&P (Signed)
 History and Physical    Patient: Valerie Kerr DOB: 26-Aug-1960 DOA: 02/08/2024 DOS: the patient was seen and examined on 02/08/2024 PCP: Delbert Clam, MD  Patient coming from: Home  Chief Complaint:  Chief Complaint  Patient presents with   Abdominal Pain   HPI: Valerie Kerr is a 63 y.o. female with medical history significant of anxiety, hypertension, hypokalemia, hypomagnesemia, chemotherapy-induced leukopenia and pancytopenia, periodontitis, herniated cervical disc, herniated lumbar disc, chronic pain disorder due to back and neck pain, history of methadone  use, history of cervical cancer, cervical cancer, history of radiation therapy on hospice who came into the emergency department complaints of worsening abdominal pain.  She is currently sedated with analgesics and unable to elaborate.  History provided by family members.  Lab work: Urinalysis was amber in color, cloudy in appearance with moderate hemoglobin, large leukocyte esterase and protein of 100 mg/dL.  Microscopic examination shows 21-50 RBC, greater than 50 WBC, many bacteria and positive WBC clumps.  CBC showed a white count 22.4, hemoglobin 10.8 g/dL and platelets 611.  CMP showed a creatinine of 1.03, albumin 2.5 and alkaline phosphatase 236 units/L.  The rest of the CMP measurements were normal.  Imaging: CT abdomen/pelvis with contrast showing interval progression of the posterior pelvic and sacral lesion with new bulky soft tissue component anterior to the L5 vertebral body.  This incorporates the proximal right internal and external iliac arteries (which remain patent) and represents the site of a new right ureteral obstruction.  Moderate right hydroureteronephrosis, new in the interval.  Gaseous distention of the nondependent colon without evidence for colonic obstruction.  Aortic atherosclerosis.   ED course: Initial vital signs were temperature 98.4 F, pulse 113, respirations 16, BP 95% on room  air.  The patient received 1000 mL normal saline bolus, alprazolam  0.5 mg p.o. x 1, hydromorphone  2 mg IVP x 2 and ceftriaxone 2 g IVPB x 1 dose.  Review of Systems: As mentioned in the history of present illness. All other systems reviewed and are negative. 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---  treatment concurrent chemo (week 6 on hold due to pancytopenia) and radiation therapy (external beam 04-12-2016 to 05-31-2016)  started high-dose rate brachytherapy 05-26-2016   Chemotherapy-induced thrombocytopenia    Chronic pain disorder    back,neck-- chronic methadone    Environmental allergies    allergy to dust mites   GERD (gastroesophageal reflux disease)    Headache    Herniated disc, cervical    History of external beam radiation therapy 04/12/16-05/31/16   pelvis 45 Gy in 25 fractions, in  30 sessions, pelvis boost 9 Gy in 5 fractions   History of radiation therapy 06/20/16-07/04/16   tandem and ring applicator to cervix 28 Gy in 5 fractions   History of radiation therapy    Pelvis(sacrum) 05/18/23-06/16/23-Dr. Lynwood Shannon   Hypertension    Hypokalemia    Hypomagnesemia    po supplement and IV replacement   Leukopenia due to antineoplastic chemotherapy    Lumbar herniated disc    Pancytopenia due to chemotherapy    Periodontitis    Past Surgical History:  Procedure Laterality Date   ABCESS DRAINAGE Left 1982   breast   CARPAL TUNNEL RELEASE Bilateral    CERVICAL DISC SURGERY  2010   MULTIPLE EXTRACTIONS WITH ALVEOLOPLASTY N/A 04/08/2016   Procedure: Extraction of tooth #'s  [phone] with alveoloplasty  and gross debridement of remaining teeth;  Surgeon: Tanda JULIANNA Fanny, DDS;  Location: [facility];  Service: Oral Surgery;  Laterality: N/A;   TANDEM RING INSERTION N/A 05/26/2016   Procedure: TANDEM RING INSERTION;  Surgeon: Lynwood Nasuti,  MD;  Location: [facility];  Service: Urology;  Laterality: N/A;   TANDEM RING INSERTION N/A 06/02/2016   Procedure: TANDEM RING INSERTION;  Surgeon: Lynwood Nasuti, MD;  Location: [facility];  Service: Urology;  Laterality: N/A;   TANDEM RING INSERTION N/A 06/20/2016   Procedure: TANDEM RING INSERTION;  Surgeon: Lynwood Nasuti, MD;  Location: [facility];  Service: Urology;  Laterality: N/A;   TANDEM RING INSERTION N/A 06/23/2016   Procedure: TANDEM RING INSERTION;  Surgeon: Lynwood Nasuti, MD;  Location: [facility];  Service: Urology;  Laterality: N/A;   TANDEM RING INSERTION N/A 07/04/2016   Procedure: TANDEM RING INSERTION;  Surgeon: Lynwood Nasuti, MD;  Location: [facility];  Service: Urology;  Laterality: N/A;   Social History:  reports that she has been smoking cigarettes. She has a 9 pack-year smoking history. She has never used smokeless tobacco. She reports that she does not drink alcohol and does not use drugs.  Allergies  Allergen Reactions   Flexeril  [Cyclobenzaprine ] Other (See Comments)    Caused a lot of sedation and did not work   Codeine Itching and Rash   Fentanyl  Swelling, Rash and Other (See Comments)    Skin rash, local swelling from patch   Tramadol Nausea Only and Other (See Comments)    GI upset,also trembling sensation    Family History  Problem Relation Age of Onset   Cancer Father    Cancer Sister        throat   Cancer Brother        throat and stomach   Cancer Brother        lung    Prior to Admission medications   Medication Sig Start Date End Date Taking? Authorizing Provider  ALPRAZolam  (XANAX ) 0.25 MG tablet Take 0.5 tablets (0.125 mg total) by mouth 2 (two) times daily as needed. 11/08/23     ALPRAZolam  (XANAX ) 0.25 MG tablet Take 1/2 tablet by mouth 2 times daily as needed 12/06/23     ALPRAZolam  (XANAX ) 0.25 MG tablet Take 2 tablets (0.5 mg total) by mouth every 12 (twelve) hours  as needed for anxiety. 01/05/24     ALPRAZolam  (XANAX ) 0.25 MG tablet Take 1 tablet by mouth every four hours as needed for anxiety 02/06/24     azelastine  (ASTELIN ) 0.1 % nasal spray Spray 1 spray into both nostrils twice a day as needed for allergies 12/12/23     bisacodyl  (DULCOLAX) 10 MG suppository Place 1 suppository (10 mg total) rectally at bedtime if patient has not had a bowel movement for 3 days.  (constipation) 01/23/24     dexamethasone  (DECADRON ) 4 MG tablet Take 2 tablets (8 mg total) by mouth daily. 12/11/23     dexamethasone  (DECADRON ) 4 MG tablet Take 2 tablets (8 mg total) by mouth daily. 12/25/23     dexamethasone  (DECADRON ) 4 MG tablet Take 2 tablets (8 mg total) by mouth daily for pain 01/05/24     fluticasone  (FLONASE ) 50 MCG/ACT nasal spray INSTILL 1 SPRAY INTO EACH NOSTRIL DAILY Patient taking differently: Place 1 spray into both nostrils daily as needed for allergies or rhinitis. 04/09/19   Newlin, Enobong, MD  fluticasone  (FLONASE ) 50  MCG/ACT nasal spray Place 1 spray into both nostrils daily. 12/04/23     HYDROmorphone  (DILAUDID ) 8 MG tablet Take 1.5 tablet by mouth every three hours as needed for pain or shortness of breath. 11/09/23     HYDROmorphone  (DILAUDID ) 8 MG tablet Take 1.5 tablets (12 mg total) by mouth every 3 (three) hours as needed for pain or dyspnea 11/28/23     HYDROmorphone  (DILAUDID ) 8 MG tablet Take 1.5 tablets (12 mg total) by mouth every 3 (three) hours as needed for pain or dyspnea 12/06/23     HYDROmorphone  (DILAUDID ) 8 MG tablet Take 1.5 tablets (12 mg total) by mouth every 3 (three) hours 12/11/23     HYDROmorphone  (DILAUDID ) 8 MG tablet Take 1 tablet (8 mg total) by mouth every 3 (three) hours as needed for pain or shortness of breath 12/25/23     HYDROmorphone  (DILAUDID ) 8 MG tablet Take 1.5 tablets (12 mg total) by mouth every 3 (three) hours as needed for pain or shortness of breath. 01/03/24     HYDROmorphone  (DILAUDID ) 8 MG tablet Take 2 tablet by mouth  every three hours as needed for pain or shortness of breath 02/06/24     ibuprofen  (ADVIL ) 600 MG tablet Take 1 tablet (600 mg total) by mouth every 8 (eight) hours as needed for moderate pain (pain score 4-6) or mild pain (pain score 1-3). 09/25/23   Pickenpack-Cousar, Athena N, NP  lidocaine  (LIDODERM ) 5 % Place 1 patch onto the skin daily. Remove & Discard patch within 12 hours or as directed by MD Patient taking differently: Place 1 patch onto the skin daily as needed (for pain- Remove & Discard patch within 12 hours or as directed by MD). 08/07/23   Pickenpack-Cousar, Athena N, NP  lisinopril -hydrochlorothiazide  (ZESTORETIC ) 10-12.5 MG tablet Take 1 tablet by mouth daily for hypertension. 11/22/23     Menthol , Topical Analgesic, (BIOFREEZE) 10 % CREA Apply topically 3 (three) times daily as needed. Patient taking differently: Apply 1 Application topically 3 (three) times daily as needed (for pain). 08/07/23   Pickenpack-Cousar, Fannie SAILOR, NP  Menthol , Topical Analgesic, (BIOFREEZE) 10 % CREA Apply topically as needed every 8 Hours for pain. 01/10/24     methadone  (DOLOPHINE ) 10 MG tablet Take 7 tablets (70 mg) in the morning, 4 tablets (40 mg) in the afternoon and 4 tablets (40 mg) in the evening 10/17/23   Leotis Bogus, MD  methadone  (DOLOPHINE ) 10 MG tablet Take 7 tablets (70mg ) in the morning, 4 tablets (40mg ) in the afternoon and 4 tablets (40mg ) in the evening 10/30/23     methadone  (DOLOPHINE ) 10 MG tablet Take 7 tablets (70mg ) by mouth in the morning, 4 tablets (40mg ) in the afternoon and 4 tablets (40mg ) in the evening as directed for pain. 11/09/23     methadone  (DOLOPHINE ) 10 MG tablet Take 7 tablets (70mg ) by mouth in the morning, 4 tablets (40mg ) in the afternoon and 4 tablets (40mg ) in the evening. 11/14/23     methadone  (DOLOPHINE ) 10 MG tablet Take 7 tablets (70mg ) in the morning, 4 tablets (40mg ) in the afternoon and 4 tablets (40mg ) in the evening 12/12/23     methadone  (DOLOPHINE ) 10 MG  tablet Take 7 tablets (70mg ) in the morning, 7 tablets (70mg ) at 2pm, and 4 tablets at bedtime 02/03/24     omeprazole  (PRILOSEC) 20 MG capsule Take 1 capsule (20 mg total) by mouth daily. Must have office visit for refills Patient taking differently: Take 20 mg by mouth daily before  breakfast. 05/02/19   Newlin, Enobong, MD  omeprazole  (PRILOSEC) 20 MG capsule Take 1 capsule (20 mg total) by mouth daily. 12/25/23     omeprazole  (PRILOSEC) 20 MG capsule Take 1 capsule (20 mg total) by mouth daily for GERD. 01/10/24     ondansetron  (ZOFRAN ) 4 MG tablet Take 1 tablet (4 mg total) by mouth every 6 (six) hours as needed for nausea. 10/10/23   Will Almarie MATSU, MD  OVER THE COUNTER MEDICATION Take 1 tablet by mouth See admin instructions. Equate Severe Sinus Congestions & Pain Caplets- Take 1 caplet by mouth every four hours as needed for sinus pain    [provider]  oxycodone  (ROXICODONE ) 30 MG immediate release tablet Take 1 tablet (30 mg total) every 4 hours by mouth as needed. 10/27/23     oxycodone  (ROXICODONE ) 30 MG immediate release tablet Take 1 tablet (30 mg total) by mouth every 4 (four) hours. 11/03/23     oxycodone  (ROXICODONE ) 30 MG immediate release tablet Take 1.5 tablets by mouth every 6 hours for pain/dyspnea 01/18/24     oxycodone  (ROXICODONE ) 30 MG immediate release tablet Take 1.5 tablet by mouth every six hours as needed for pain or shortness of breath 02/06/24     polyethylene glycol powder (MIRALAX ) 17 GM/SCOOP powder Take 1 scoop in 8 oz of liquid one time daily as needed for constipation 01/19/24     pregabalin  (LYRICA ) 100 MG capsule Take 1 capsule (100 mg total) by mouth 3 (three) times daily for neuropathy 12/07/23     senna-docusate (EQ STOOL SOFTENER/LAXATIVE) 8.6-50 MG tablet Take 2 tablets by mouth at bedtime as needed for constipation 01/23/24     senna-docusate (SENOKOT-S) 8.6-50 MG tablet Take 2 tablets by mouth at bedtime. 10/10/23   Will Almarie MATSU, MD     Physical Exam: Vitals:   02/08/24 0315 02/08/24 0430 02/08/24 0630 02/08/24 0801  BP: 126/76 118/75 101/60   Pulse: (!) 118 (!) 109 (!) 103   Resp: 14 12 11    Temp: 98.4 F (36.9 C)   (!) 97.5 F (36.4 C)  TempSrc: Oral   Oral  SpO2: 95% 93% 98%    Physical Exam Vitals and nursing note reviewed.  Constitutional:      General: She is not in acute distress.    Appearance: She is ill-appearing.  HENT:     Head: Normocephalic.     Nose: No rhinorrhea.     Mouth/Throat:     Mouth: Mucous membranes are moist.  Eyes:     General: No scleral icterus.    Pupils: Pupils are equal, round, and reactive to light.  Cardiovascular:     Rate and Rhythm: Regular rhythm. Tachycardia present.  Pulmonary:     Effort: Pulmonary effort is normal.     Breath sounds: Normal breath sounds. No wheezing, rhonchi or rales.  Abdominal:     General: Bowel sounds are normal. There is no distension.     Palpations: Abdomen is soft.     Tenderness: There is abdominal tenderness in the right upper quadrant, right lower quadrant, epigastric area, periumbilical area and suprapubic area. There is right CVA tenderness. There is no left CVA tenderness.  Musculoskeletal:     Cervical back: Neck supple.     Right lower leg: No edema.     Left lower leg: No edema.  Skin:    General: Skin is warm and dry.  Neurological:     Mental Status: She is alert and easily  aroused. Mental status is at baseline.  Psychiatric:        Mood and Affect: Mood normal.        Behavior: Behavior is cooperative.     Data Reviewed:  Results are pending, will review when available. 05/25/2023 echocardiogram report. IMPRESSIONS:   1. Left ventricular ejection fraction, by estimation, is 55 to 60%. The left ventricle has normal function. The left ventricle has no regional wall motion abnormalities. Left ventricular diastolic parameters were normal.  2. Right ventricular systolic function is normal. The right  ventricular size is normal.  3. The mitral valve is normal in structure. No evidence of mitral valve regurgitation. No evidence of mitral stenosis.  4. The aortic valve is tricuspid. Aortic valve regurgitation is not visualized. No aortic stenosis is present.  5. The inferior vena cava is normal in size with greater than 50% respiratory variability, suggesting right atrial pressure of 3 mmHg.  Comparison(s): No prior Echocardiogram.  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 Inpatient/MedSurg. Continue IV fluids. Analgesics as needed. Antiemetics as needed. Continue ceftriaxone 1 g IVPB. Follow-up urine culture and sensitivity. Follow CBC, CMP in AM. Urology consult appreciated. - They offered no intervention versus nephrostomy tube. -Urology will try to discuss with the patient when she is more alert.  Active Problems:   Cancer-related breakthrough pain from  Metastatic leiomyosarcoma of the bone /Chronic pain syndrome Continue analgesics as needed. Pharmacy consulted for med rec.    Essential hypertension Hold lisinopril  hydrochlorothiazide  for now. Monitor BP closely. As needed antihypertensives.    GERD (gastroesophageal reflux disease) Antiacid or H2 blocker as needed. Continue home PPI.    Anemia due to antineoplastic chemotherapy Monitor hematocrit hemoglobin. Transfuse PRBC as needed.    Moderate protein malnutrition In the setting of anemia and malignancy. May benefit from protein supplementation. Consider nutritional services evaluation. Follow-up albumin level in the morning.    Advance Care Planning:   Code Status: Limited: Do not attempt resuscitation (DNR) -DNR-LIMITED -Do Not Intubate/DNI    Consults: Urology Ladena Manly, MD).  Family Communication: Her daughter and 2 other family  members were at bedside.  Severity of Illness: The appropriate patient status for this patient is INPATIENT. Inpatient status is judged to be reasonable and necessary in order to provide the required intensity of service to ensure the patient's safety. The patient's presenting symptoms, physical exam findings, and initial radiographic and laboratory data in the context of their chronic comorbidities is felt to place them at high risk for further clinical deterioration. Furthermore, it is not anticipated that the patient will be medically stable for discharge from the hospital within 2 midnights of admission.   * I certify that at the point of admission it is my clinical judgment that the patient will require inpatient hospital care spanning beyond 2 midnights from the point of admission due to high intensity of service, high risk for further deterioration and high frequency of surveillance required.*  Author: Alm Dorn Castor, MD 02/08/2024 8:38 AM  For on call review [email or web address].   This document was prepared using Dragon voice recognition software and may contain some unintended transcription errors.

## 2024-02-08 NOTE — Plan of Care (Signed)
  Problem: Metabolic: Goal: Ability to maintain appropriate glucose levels will improve Outcome: Progressing   Problem: Tissue Perfusion: Goal: Adequacy of tissue perfusion will improve Outcome: Progressing   Problem: Education: Goal: Knowledge of General Education information will improve Description: Including pain rating scale, medication(s)/side effects and non-pharmacologic comfort measures Outcome: Progressing   Problem: Pain Managment: Goal: General experience of comfort will improve and/or be controlled Outcome: Progressing   Problem: Safety: Goal: Ability to remain free from injury will improve Outcome: Progressing

## 2024-02-08 NOTE — Consult Note (Signed)
 Urology Consult Note   Requesting Attending Physician:  Celinda Alm Lot, MD Service Providing Consult: Urology  Consulting Attending: Dr. Roseann   Reason for Consult: Right-sided hydronephrosis  HPI: Valerie Kerr is seen in consultation for reasons noted above at the request of Celinda Alm Lot, MD. Patient is a 63 y.o. female presenting with worsening lower abdominal pain x 3 days.  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  Workup revealed new moderate right side hydroureteronephrosis d/t extrinsic compression from right pelvic mass.  On my arrival patient was very sedated though oriented.  She reported that she felt like she was not completely emptying.  Foley catheter placed on arrival with drainage of brown urine and significant amounts of floating material with the appearance of sloughing tissue.  She stated that she did not want to proceed with any further curative efforts but did want pain control.  She stated she had left hospice, though still appears to be under their care.  Case and plan was reviewed with patient and her niece who accompanied her at bedside.  They again reviewed quested that I review the case and plan with her daughter.  Her daughter has now requested that I review the case and plan with her husband.  She will present to the bedside and put any required family members on the phone time.   ------------------  Assessment:   63 y.o. female with right hydronephrosis 2/2 malignant obstruction   Recommendations: # Right hydroureteronephrosis # Malignant ureteral obstruction  Patient appears to have advanced disease with a significant amount of pain.  Being that she was already under hospice care and does not seek any other curative efforts, I attempted to engage  her in a goals of care conversation before recommending renal drainage with percutaneous nephrostomy tube.  She seems conflicted and is too sedated to make an informed decision.  There is a large amount of floating tissue like material in the catheter drainage bag.  This could represent malignant infiltration, though no fluid extravasation was noted on CT imaging.    Trend labs.  Renal function is preserved for the time being.  Patient is afebrile.  WBC 22.4.  Urinalysis suggestive of infection with many bacteria and large leukocytes.  She does not have any outward signs of a systemic infectious picture at this time, though that could change quickly with a urinary obstruction and infection.  After I have an opportunity to discuss goals of care with her family, we will decide whether or not to consult interventional radiology for percutaneous nephrostomy decompression versus comfort measures.  I do not think her hypogastric abdominal pain is related to this obstruction.  It may be multifactorial between her advancing pelvic tumor burden, and urinary tract infection.  Urology will follow  Case and plan discussed with Dr. Roseann  Past Medical History: Past Medical History:  Diagnosis Date   Anxiety    Cervical cancer [facility]) oncologist-  dr gorsuch/ dr shannon   02-20-2016 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---  treatment concurrent chemo (week 6 on hold due to pancytopenia) and radiation therapy (external beam 04-12-2016 to 05-31-2016)  started high-dose rate brachytherapy 05-26-2016   Chemotherapy-induced thrombocytopenia    Chronic pain disorder    back,neck-- chronic methadone    Environmental allergies    allergy to dust mites   GERD (gastroesophageal reflux  disease)    Headache    Herniated disc, cervical    History of external beam radiation therapy 04/12/16-05/31/16   pelvis 45 Gy in 25 fractions, in  30 sessions, pelvis boost 9 Gy in 5 fractions   History of  radiation therapy 06/20/16-07/04/16   tandem and ring applicator to cervix 28 Gy in 5 fractions   History of radiation therapy    Pelvis(sacrum) 05/18/23-06/16/23-Dr. Lynwood Nasuti   Hypertension    Hypokalemia    Hypomagnesemia    po supplement and IV replacement   Leukopenia due to antineoplastic chemotherapy    Lumbar herniated disc    Pancytopenia due to chemotherapy    Periodontitis     Past Surgical History:  Past Surgical History:  Procedure Laterality Date   ABCESS DRAINAGE Left 1982   breast   CARPAL TUNNEL RELEASE Bilateral    CERVICAL DISC SURGERY  2010   MULTIPLE EXTRACTIONS WITH ALVEOLOPLASTY N/A 04/08/2016   Procedure: Extraction of tooth #'s 6,0,78,69,68 with alveoloplasty  and gross debridement of remaining teeth;  Surgeon: Tanda JULIANNA Fanny, DDS;  Location: [facility] OR;  Service: Oral Surgery;  Laterality: N/A;   TANDEM RING INSERTION N/A 05/26/2016   Procedure: TANDEM RING INSERTION;  Surgeon: Lynwood Nasuti, MD;  Location: [facility];  Service: Urology;  Laterality: N/A;   TANDEM RING INSERTION N/A 06/02/2016   Procedure: TANDEM RING INSERTION;  Surgeon: Lynwood Nasuti, MD;  Location: [facility];  Service: Urology;  Laterality: N/A;   TANDEM RING INSERTION N/A 06/20/2016   Procedure: TANDEM RING INSERTION;  Surgeon: Lynwood Nasuti, MD;  Location: [facility];  Service: Urology;  Laterality: N/A;   TANDEM RING INSERTION N/A 06/23/2016   Procedure: TANDEM RING INSERTION;  Surgeon: Lynwood Nasuti, MD;  Location: [facility];  Service: Urology;  Laterality: N/A;   TANDEM RING INSERTION N/A 07/04/2016   Procedure: TANDEM RING INSERTION;  Surgeon: Lynwood Nasuti, MD;  Location: [facility];  Service: Urology;  Laterality: N/A;    Medication: Current Facility-Administered Medications  Medication Dose Route Frequency Provider Last Rate Last Admin   acetaminophen  (TYLENOL ) tablet 650 mg  650 mg Oral Q6H PRN Celinda Alm Lot, MD       Or   acetaminophen  (TYLENOL ) suppository 650 mg  650 mg Rectal Q6H PRN Celinda Alm Lot, MD       [START ON 02/09/2024] cefTRIAXone (ROCEPHIN) 1 g in sodium chloride  0.9 % 100 mL IVPB  1 g Intravenous Q24H Celinda Alm Lot, MD       enoxaparin  (LOVENOX ) injection 40 mg  40 mg Subcutaneous Q24H Celinda Alm Lot, MD       HYDROmorphone  (DILAUDID ) injection 2 mg  2 mg Intravenous Q1H PRN Raford Alm, MD   2 mg at 02/08/24 9247   ondansetron  (ZOFRAN ) tablet 4 mg  4 mg Oral Q6H PRN Celinda Alm Lot, MD       Or   ondansetron  (ZOFRAN ) injection 4 mg  4 mg Intravenous Q6H PRN Celinda Alm Lot, MD       Current Outpatient Medications  Medication Sig Dispense Refill   ALPRAZolam  (XANAX ) 0.25 MG tablet Take 0.5 tablets (0.125 mg total) by mouth 2 (two) times daily as needed. 15 tablet 0   ALPRAZolam  (XANAX ) 0.25 MG tablet Take 1/2 tablet by mouth 2 times daily as needed 30 tablet 0   ALPRAZolam  (XANAX ) 0.25 MG tablet Take 2 tablets (0.5 mg total) by mouth every 12 (twelve)  hours as needed for anxiety. 30 tablet 0   ALPRAZolam  (XANAX ) 0.25 MG tablet Take 1 tablet by mouth every four hours as needed for anxiety 3 tablet 0   azelastine  (ASTELIN ) 0.1 % nasal spray Spray 1 spray into both nostrils twice a day as needed for allergies 30 mL 3   bisacodyl  (DULCOLAX) 10 MG suppository Place 1 suppository (10 mg total) rectally at bedtime if patient has not had a bowel movement for 3 days.  (constipation) 5 suppository 0   dexamethasone  (DECADRON ) 4 MG tablet Take 2 tablets (8 mg total) by mouth daily. 30 tablet 3   dexamethasone  (DECADRON ) 4 MG tablet Take 2 tablets (8 mg total) by mouth daily. 30 tablet 3   dexamethasone  (DECADRON ) 4 MG tablet Take 2 tablets (8 mg total) by mouth daily for pain 30 tablet 3   fluticasone  (FLONASE ) 50 MCG/ACT nasal spray INSTILL 1 SPRAY INTO EACH NOSTRIL DAILY (Patient taking differently: Place 1 spray into both nostrils daily as needed for  allergies or rhinitis.) 48 mL 0   fluticasone  (FLONASE ) 50 MCG/ACT nasal spray Place 1 spray into both nostrils daily. 16 g 3   HYDROmorphone  (DILAUDID ) 8 MG tablet Take 1.5 tablet by mouth every three hours as needed for pain or shortness of breath. 60 tablet 0   HYDROmorphone  (DILAUDID ) 8 MG tablet Take 1.5 tablets (12 mg total) by mouth every 3 (three) hours as needed for pain or dyspnea 60 tablet 0   HYDROmorphone  (DILAUDID ) 8 MG tablet Take 1.5 tablets (12 mg total) by mouth every 3 (three) hours as needed for pain or dyspnea 60 tablet 0   HYDROmorphone  (DILAUDID ) 8 MG tablet Take 1.5 tablets (12 mg total) by mouth every 3 (three) hours 60 tablet 0   HYDROmorphone  (DILAUDID ) 8 MG tablet Take 1 tablet (8 mg total) by mouth every 3 (three) hours as needed for pain or shortness of breath 60 tablet 0   HYDROmorphone  (DILAUDID ) 8 MG tablet Take 1.5 tablets (12 mg total) by mouth every 3 (three) hours as needed for pain or shortness of breath. 60 tablet 0   HYDROmorphone  (DILAUDID ) 8 MG tablet Take 2 tablet by mouth every three hours as needed for pain or shortness of breath 6 tablet 0   ibuprofen  (ADVIL ) 600 MG tablet Take 1 tablet (600 mg total) by mouth every 8 (eight) hours as needed for moderate pain (pain score 4-6) or mild pain (pain score 1-3). 60 tablet 1   lidocaine  (LIDODERM ) 5 % Place 1 patch onto the skin daily. Remove & Discard patch within 12 hours or as directed by MD (Patient taking differently: Place 1 patch onto the skin daily as needed (for pain- Remove & Discard patch within 12 hours or as directed by MD).) 30 patch 3   lisinopril -hydrochlorothiazide  (ZESTORETIC ) 10-12.5 MG tablet Take 1 tablet by mouth daily for hypertension. 15 tablet 0   Menthol , Topical Analgesic, (BIOFREEZE) 10 % CREA Apply topically 3 (three) times daily as needed. (Patient taking differently: Apply 1 Application topically 3 (three) times daily as needed (for pain).) 85 g 3   Menthol , Topical Analgesic,  (BIOFREEZE) 10 % CREA Apply topically as needed every 8 Hours for pain. 85 g 2   methadone  (DOLOPHINE ) 10 MG tablet Take 7 tablets (70 mg) in the morning, 4 tablets (40 mg) in the afternoon and 4 tablets (40 mg) in the evening 33 tablet 0   methadone  (DOLOPHINE ) 10 MG tablet Take 7 tablets (70mg ) in the morning,  4 tablets (40mg ) in the afternoon and 4 tablets (40mg ) in the evening 105 tablet 0   methadone  (DOLOPHINE ) 10 MG tablet Take 7 tablets (70mg ) by mouth in the morning, 4 tablets (40mg ) in the afternoon and 4 tablets (40mg ) in the evening as directed for pain. 105 tablet 0   methadone  (DOLOPHINE ) 10 MG tablet Take 7 tablets (70mg ) by mouth in the morning, 4 tablets (40mg ) in the afternoon and 4 tablets (40mg ) in the evening. 105 tablet 0   methadone  (DOLOPHINE ) 10 MG tablet Take 7 tablets (70mg ) in the morning, 4 tablets (40mg ) in the afternoon and 4 tablets (40mg ) in the evening 105 tablet 0   methadone  (DOLOPHINE ) 10 MG tablet Take 7 tablets (70mg ) in the morning, 7 tablets (70mg ) at 2pm, and 4 tablets at bedtime 105 tablet 0   omeprazole  (PRILOSEC) 20 MG capsule Take 1 capsule (20 mg total) by mouth daily. Must have office visit for refills (Patient taking differently: Take 20 mg by mouth daily before breakfast.) 90 capsule 0   omeprazole  (PRILOSEC) 20 MG capsule Take 1 capsule (20 mg total) by mouth daily. 15 capsule 3   omeprazole  (PRILOSEC) 20 MG capsule Take 1 capsule (20 mg total) by mouth daily for GERD. 15 capsule 3   ondansetron  (ZOFRAN ) 4 MG tablet Take 1 tablet (4 mg total) by mouth every 6 (six) hours as needed for nausea. 20 tablet 0   OVER THE COUNTER MEDICATION Take 1 tablet by mouth See admin instructions. Equate Severe Sinus Congestions & Pain Caplets- Take 1 caplet by mouth every four hours as needed for sinus pain     oxycodone  (ROXICODONE ) 30 MG immediate release tablet Take 1 tablet (30 mg total) every 4 hours by mouth as needed. 30 tablet 0   oxycodone  (ROXICODONE ) 30 MG  immediate release tablet Take 1 tablet (30 mg total) by mouth every 4 (four) hours. 60 tablet 0   oxycodone  (ROXICODONE ) 30 MG immediate release tablet Take 1.5 tablets by mouth every 6 hours for pain/dyspnea 36 tablet 0   oxycodone  (ROXICODONE ) 30 MG immediate release tablet Take 1.5 tablet by mouth every six hours as needed for pain or shortness of breath 3 tablet 0   polyethylene glycol powder (MIRALAX ) 17 GM/SCOOP powder Take 1 scoop in 8 oz of liquid one time daily as needed for constipation 238 g 0   pregabalin  (LYRICA ) 100 MG capsule Take 1 capsule (100 mg total) by mouth 3 (three) times daily for neuropathy 45 capsule 4   senna-docusate (EQ STOOL SOFTENER/LAXATIVE) 8.6-50 MG tablet Take 2 tablets by mouth at bedtime as needed for constipation 30 tablet 0   senna-docusate (SENOKOT-S) 8.6-50 MG tablet Take 2 tablets by mouth at bedtime.      Allergies: Allergies  Allergen Reactions   Flexeril  [Cyclobenzaprine ] Other (See Comments)    Caused a lot of sedation and did not work   Codeine Itching and Rash   Fentanyl  Swelling, Rash and Other (See Comments)    Skin rash, local swelling from patch   Tramadol Nausea Only and Other (See Comments)    GI upset,also trembling sensation    Social History: Social History   Tobacco Use   Smoking status: Some Days    Current packs/day: 0.20    Average packs/day: 0.2 packs/day for 45.0 years (9.0 ttl pk-yrs)    Types: Cigarettes   Smokeless tobacco: Never   Tobacco comments:    1 week since last cigarette  Vaping Use   Vaping  status: Never Used  Substance Use Topics   Alcohol use: No   Drug use: No    Family History Family History  Problem Relation Age of Onset   Cancer Father    Cancer Sister        throat   Cancer Brother        throat and stomach   Cancer Brother        lung    Review of Systems  Genitourinary:  Positive for dysuria, frequency and urgency.     Objective   Vital signs in last 24 hours: BP 101/60    Pulse (!) 103   Temp (!) 97.5 F (36.4 C) (Oral)   Resp 11   SpO2 98%   Physical Exam General: A&O, resting, appropriate HEENT: [place]/AT Pulmonary: Normal work of breathing Cardiovascular: no cyanosis Abdomen: Soft, NTTP, nondistended GU: Catheter in place with brown urine filled with floating white material resembling tissue slough   Most Recent Labs: Lab Results  Component Value Date   WBC 22.4 (H) 02/08/2024   HGB 10.8 (L) 02/08/2024   HCT 35.1 (L) 02/08/2024   PLT 388 02/08/2024    Lab Results  Component Value Date   NA 137 02/08/2024   K 3.5 02/08/2024   CL 98 02/08/2024   CO2 25 02/08/2024   BUN 22 02/08/2024   CREATININE 1.03 (H) 02/08/2024   CALCIUM  9.7 02/08/2024   MG 1.9 10/07/2023    No results found for: INR, APTT   Urine Culture: @[id number] (laburin,org,[id number])@   IMAGING: CT ABDOMEN PELVIS W CONTRAST Result Date: 02/08/2024 CLINICAL DATA:  Abdominal pain. History of squamous cell carcinoma of the cervix. * Tracking Code: BO * EXAM: CT ABDOMEN AND PELVIS WITH CONTRAST TECHNIQUE: Multidetector CT imaging of the abdomen and pelvis was performed using the standard protocol following bolus administration of intravenous contrast. RADIATION DOSE REDUCTION: This exam was performed according to the departmental dose-optimization program which includes automated exposure control, adjustment of the mA and/or kV according to patient size and/or use of iterative reconstruction technique. CONTRAST:  OMNIPAQUE  IOHEXOL  300 MG/ML  SOLN COMPARISON:  06/08/2023 FINDINGS: Lower chest: Dependent atelectasis bilaterally. Hepatobiliary: No suspicious focal abnormality within the liver parenchyma. There is no evidence for gallstones, gallbladder wall thickening, or pericholecystic fluid. No intrahepatic or extrahepatic biliary dilation. Pancreas: No focal mass lesion. No dilatation of the main duct. No intraparenchymal cyst. No peripancreatic edema. Spleen: No  splenomegaly. No suspicious focal mass lesion. Adrenals/Urinary Tract: No adrenal nodule or mass. Tiny well-defined homogeneous low-density lesion in the left kidney is too small to characterize but is statistically most likely benign and probably a cyst. No followup imaging is recommended. Moderate right hydroureteronephrosis evident, new in the interval. Ureteral dilatation extends down to the right pelvic sidewall where it becomes incorporated into the pelvic mass. Foley catheter decompresses the urinary bladder. Gas in the bladder lumen is compatible with the instrumentation. Stomach/Bowel: Stomach is unremarkable. No gastric wall thickening. No evidence of outlet obstruction. Duodenum is normally positioned as is the ligament of Treitz. No small bowel wall thickening. No small bowel dilatation. The terminal ileum is normal. The appendix is normal. Gaseous distension of the non dependent: Without evidence for colonic obstruction. Vascular/Lymphatic: There is mild atherosclerotic calcification of the abdominal aorta without aneurysm. There is no gastrohepatic or hepatoduodenal ligament lymphadenopathy. No retroperitoneal or mesenteric lymphadenopathy. No pelvic sidewall lymphadenopathy. Reproductive: Uterus is unenlarged.  There is no adnexal mass. Other: Interval progression of the posterior pelvic and sacral  lesion progression is best appreciated on sagittal imaging (compare image 107/9 today to image 89/9 previously. There is a new bulky soft tissue component anterior to the L5 vertebral body measuring 4.1 x 2.8 cm on sagittal imaging corresponding to a new soft tissue mass lesion seen on axial 59/2. This incorporates the proximal right internal and external iliac arteries and represents the lesion that obstructs the right ureter (visible on 60/2). Axial imaging shows extension of tumor into the sciatic notch region bilaterally. Musculoskeletal: Large pelvic mass with involvement of the L5 vertebral body and  destruction of the upper sacrum as above. Involvement of the cranial aspect of the SI joints noted bilaterally. Chronic fracture nonunion right pubic bone. IMPRESSION: 1. Interval progression of the posterior pelvic and sacral lesion with new bulky soft tissue component anterior to the L5 vertebral body. This incorporates the proximal right internal and external iliac arteries (which remain patent) and represents the site of new right ureteral obstruction. 2. Moderate right hydroureteronephrosis, new in the interval. 3. Gaseous distension of the non dependent colon without evidence for colonic obstruction. 4.  Aortic Atherosclerosis (ICD10-I70.0). Electronically Signed   By: Camellia Candle M.D.   On: 02/08/2024 08:03    ------  Ole Bourdon, NP Pager: [phone]   Please contact the urology consult pager with any further questions/concerns.

## 2024-02-08 NOTE — ED Provider Notes (Signed)
  Physical Exam  BP 101/60   Pulse (!) 103   Temp (!) 97.5 F (36.4 C) (Oral)   Resp 11   SpO2 98%   Physical Exam  Procedures  Procedures  ED Course / MDM    Medical Decision Making Amount and/or Complexity of Data Reviewed Labs: ordered. Radiology: ordered.  Risk Prescription drug management.   Received signout.  Abdominal pain.  On hospice for cancer.  Uncontrolled pain.  Has worsening of pelvic mass.  New hydronephrosis.  Has apparent UTI.  Needs admission for pain control.  Will discuss with urology about potential intervention.       Valerie Lot, MD 02/08/24 [phone]

## 2024-02-08 NOTE — ED Triage Notes (Signed)
 Pt came in via EMS w/ c/o of abd pain. HX of cancer in which she was put on hospice. Denies N/V.

## 2024-02-09 DIAGNOSIS — N390 Urinary tract infection, site not specified: Secondary | ICD-10-CM | POA: Diagnosis not present

## 2024-02-09 LAB — COMPREHENSIVE METABOLIC PANEL WITH GFR
ALT: 7 U/L (ref 0–44)
AST: 12 U/L — ABNORMAL LOW (ref 15–41)
Albumin: 2.3 g/dL — ABNORMAL LOW (ref 3.5–5.0)
Alkaline Phosphatase: 115 U/L (ref 38–126)
Anion gap: 9 (ref 5–15)
BUN: 20 mg/dL (ref 8–23)
CO2: 26 mmol/L (ref 22–32)
Calcium: 9.3 mg/dL (ref 8.9–10.3)
Chloride: 105 mmol/L (ref 98–111)
Creatinine, Ser: 0.88 mg/dL (ref 0.44–1.00)
GFR, Estimated: >60 mL/min (ref >60)
Glucose, Bld: 82 mg/dL (ref 70–99)
Potassium: 3.7 mmol/L (ref 3.5–5.1)
Sodium: 140 mmol/L (ref 135–145)
Total Bilirubin: 0.6 mg/dL (ref 0.0–1.2)
Total Protein: 6.3 g/dL — ABNORMAL LOW (ref 6.5–8.1)

## 2024-02-09 LAB — GLUCOSE, CAPILLARY
Glucose-Capillary: 106 mg/dL — ABNORMAL HIGH (ref 70–99)
Glucose-Capillary: 112 mg/dL — ABNORMAL HIGH (ref 70–99)
Glucose-Capillary: 75 mg/dL (ref 70–99)
Glucose-Capillary: 83 mg/dL (ref 70–99)
Glucose-Capillary: 84 mg/dL (ref 70–99)
Glucose-Capillary: 91 mg/dL (ref 70–99)

## 2024-02-09 LAB — CBC
HCT: 33.1 % — ABNORMAL LOW (ref 36.0–46.0)
Hemoglobin: 10 g/dL — ABNORMAL LOW (ref 12.0–15.0)
MCH: 27.5 pg (ref 26.0–34.0)
MCHC: 30.2 g/dL (ref 30.0–36.0)
MCV: 90.9 fL (ref 80.0–100.0)
Platelets: 364 K/uL (ref 150–400)
RBC: 3.64 MIL/uL — ABNORMAL LOW (ref 3.87–5.11)
RDW: 15.8 % — ABNORMAL HIGH (ref 11.5–15.5)
WBC: 13.9 K/uL — ABNORMAL HIGH (ref 4.0–10.5)
nRBC: 0 % (ref 0.0–0.2)

## 2024-02-09 MED ORDER — PANTOPRAZOLE SODIUM 40 MG PO TBEC
40.0000 mg | DELAYED_RELEASE_TABLET | Freq: Two times a day (BID) | ORAL | Status: DC | PRN
  Administered 2024-02-09: 40 mg via ORAL
  Filled 2024-02-09: qty 1

## 2024-02-09 MED ORDER — CHLORHEXIDINE GLUCONATE CLOTH 2 % EX PADS
6.0000 | MEDICATED_PAD | Freq: Every day | CUTANEOUS | Status: DC
  Administered 2024-02-09 – 2024-02-14 (×6): 6 via TOPICAL

## 2024-02-09 NOTE — Progress Notes (Signed)
  Progress Note   Patient: Valerie Kerr FMW:[id number] DOB: February 28, 1961 DOA: 02/08/2024     1 DOS: the patient was seen and examined on 02/09/2024 at 8:50AM and 1:20PM      Brief hospital course: 63 y.o. 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  In July, Oncology recommended Hospice, as her functional status was already far too poor to tolerate chemotherapy.  They also recommended against PCN at that time due to likely pain outweighing benefit.  Given four months interim, this was sound recommendation, and remains true at this time.  Urine culture growing E coli.  While goals of care are being worked out, will continue antibiotics.  Discussed with HOTP, patient had had home morphine PCA this week up to 20 mg/hr. - Continue Rocephin - Consult TOC for assistance with transport to [place]    Chronic pain syndrome Patient had been on methadone  100 mg daily, plus oxycodone  30 mg and hydromorphone  PO 8 mg recently.  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  Appears in pain when moved.  Complaining of heartburn.     Physical Exam: BP (P) 92/62 (BP Location: Right Arm)   Pulse (!) 108   Temp 98.2 F (36.8 C)   Resp 20   Ht 5' 1 (1.549 m)   Wt 68 kg   SpO2 99%   BMI 28.34 kg/m   Frail elderly  female, mostly unresponsive, pale, opens eyes briefly, not able to follow conversation Tachycardic, regular no murmurs, no pitting in extremities Respriatroy effort shallow, lungs diminished Abdomen with pain/grimace throughtout Attention dimiinsed, severe generalized weakness, very sedated, not fully oriented    Data Reviewed: Discussed with Urology, Oncology, palliative care BMP unremarkbale, Cr normal CBC shows leukocytosis, mild anemia     Family Communication: Daughters at the bedside    Disposition: Status is: Inpatient         Author: Lonni SHAUNNA Dalton, MD 02/09/2024 3:58 PM  For on call review [email or web address].

## 2024-02-09 NOTE — Plan of Care (Signed)
  Problem: Education: Goal: Knowledge of General Education information will improve Description: Including pain rating scale, medication(s)/side effects and non-pharmacologic comfort measures Outcome: Progressing Note: Family educated at bedside   Problem: Elimination: Goal: Will not experience complications related to urinary retention Outcome: Progressing   Problem: Pain Managment: Goal: General experience of comfort will improve and/or be controlled Outcome: Progressing   Problem: Safety: Goal: Ability to remain free from injury will improve Outcome: Progressing

## 2024-02-09 NOTE — Plan of Care (Signed)
 Patient has not received pain medication overnight but is also not waking up on rounds.  Reviewed the case and plan with nursing who will reach out if patient's level of consciousness improves, with plans for her to discuss her goals of care with her daughter.  Also reviewed her case with hospitalist attending this morning.  The notes from her oncologist this summer are very clear, she is not eligible for additional treatment.  The daughter has removed her from hospice and wishes to pursue aggressive measures in [place] .  Our recommendation remains to focus on comfort measures. Urology will follow peripherally while family hopefully comes to a consensus.

## 2024-02-09 NOTE — Progress Notes (Signed)
 Spoke with the patient's daughter/Power of Attorney [facility]) at the bedside regarding the plan to initiate continuous pulse oximetry monitoring to track oxygen saturation and heart rate, as the patient is becoming more arousable and response to pain medication needs close monitoring. Patient's daughter/POA reported that the patient has a history of anxiety and requested the addition of Ativan  to the medication regimen, to be administered along with pain medication. Informed the family member that the request would be relayed to the covering attending physician for review. A phone conversation was held with the covering attending physician, and their response was subsequently relayed to the daughter/POA. The daughter/POA verbalized concern and frustration regarding the patient not receiving medications she was prescribed while on hospice prior to this admission, citing concern for potential withdrawal symptoms. Educated the family member on the need for cautious titration and administration of sedating medications to prevent over-sedation and the subsequent risk of requiring reversal agents. Daughter/POA remained upset and requested to speak directly with the provider. The request for the provider to speak with the patient's daughter/POA was communicated to the covering attending physician.

## 2024-02-09 NOTE — Plan of Care (Signed)
  Problem: Coping: Goal: Ability to adjust to condition or change in health will improve Outcome: Progressing   Problem: Metabolic: Goal: Ability to maintain appropriate glucose levels will improve Outcome: Progressing   Problem: Tissue Perfusion: Goal: Adequacy of tissue perfusion will improve Outcome: Progressing   Problem: Coping: Goal: Level of anxiety will decrease Outcome: Progressing   Problem: Elimination: Goal: Will not experience complications related to urinary retention Outcome: Progressing   Problem: Pain Managment: Goal: General experience of comfort will improve and/or be controlled Outcome: Progressing   Problem: Safety: Goal: Ability to remain free from injury will improve Outcome: Progressing   Problem: Fluid Volume: Goal: Ability to maintain a balanced intake and output will improve Outcome: Not Progressing   Problem: Nutritional: Goal: Maintenance of adequate nutrition will improve Outcome: Not Progressing

## 2024-02-09 NOTE — Hospital Course (Addendum)
 Brief Narrative:   63 y.o. 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  Reportedly patient has been on hospice service.  Palliative care team is following this patient. Briefly patient was initiated on PCA pump for better pain control and thereafter transition to p.o. After several days of finding safe disposition, daughter was able to arrange for transportation to [place]  under hospice services.  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  Initially enrolled in [facility] [place] and transition to residential hospice for high-dose PCA.  Family has been trying to transition hospice up in [place]  unfortunately fell through last minute due to the cost of transport.  Family unenrolled from current hospice services and brought her to the hospital to reexplore transportation to [place] .  Currently pain has been controlled with the help of palliative care services. -Per oncology patient is not a candidate for any further advanced treatments    Possible UTI Urine from admission growing E coli.   Urology were consulted, recommended against PCN given chronicity, likely discomfort outweighing unclear benefit. 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  Patient will travel with supplemental oxygen and  Foley catheter for comfort as well.  Medications will be delivered to her bedside.  DVT prophylaxis: enoxaparin  (LOVENOX ) injection 40 mg Start: 02/08/24 2200      Code Status: Limited: Do not attempt resuscitation (DNR) -DNR-LIMITED -Do Not Intubate/DNI  Family Communication:   Status is: Inpatient Remains inpatient appropriate because: Ongoing safe disposition anticipate discharge todayat 4 PM.  All the staff is aware to help make arrangements and transition happen smoothly   PT Follow up Recs:   Subjective: No complaints at the time of my visit. She was resting comfortably.    Examination:  General exam: Appears calm and comfortable  Respiratory system: Clear to auscultation. Respiratory effort normal. Cardiovascular system: S1 & S2 heard, RRR. No JVD, murmurs, rubs, gallops or clicks. No pedal edema. Gastrointestinal system: Abdomen is nondistended, soft and nontender. No organomegaly or masses felt. Normal bowel sounds heard. Central nervous system: Alert and oriented. No focal neurological deficits. Extremities: Symmetric 5 x 5 power. Skin: No rashes, lesions or ulcers Psychiatry: Judgement and insight appear normal. Mood & affect appropriate. Foley in place.

## 2024-02-09 NOTE — Plan of Care (Addendum)
 Reviewed imaging this morning with another Urologist who felt most recent imaging did represent invasion into the bladder.  Paired with the large amount of material floating in the bladder contents, representative of slough, this is most likely the case.  Her white blood cell count has improved remarkably overnight and she continues to receive antibiotics.  Percutaneous nephrostomy tube was again discussed but I feel total gain when compared against the fact that she is stuck laying flat on her back d/t her spinal mets, and the discomfort of the PCNT, the negatives outweighed the potential benefits.  Long conversation had with her attending and multiple members of the family again today.  I also reviewed her case with oncologist, who confirmed that she was long past all available treatment options.  Family's focus appears to be getting their mother to [place]  for the remainder of her life and have been in communication with hospice there.  Neurology will follow along peripherally.  No emergent surgical indications.  We recommend focus on comfort measures

## 2024-02-09 NOTE — TOC Initial Note (Signed)
 Transition of Care [facility]) - Initial/Assessment Note    Patient Details  Name: Valerie Kerr MRN: [id number] Date of Birth: 09/07/1960  Transition of Care [facility]) CM/SW Contact:    [name], RN Phone Number:[phone]  02/09/2024, 3:43 PM  Clinical Narrative:                 ICM acknowledges general consult for Home Health / DME Needs. Currently there are no HH/DME needs. Patient is from home where daughter cares for her. Hospice services with [facility] [place] have been revoked.   MD has requested that CM discuss transportation cost /options for getting patient transported to [place]. Cm at bedside introduced self and explained request per MD . Daughter states that family is wanting to get patient to [address] [place] [address]. CM has called [facility] for cost estimate. Estimate at 300 miles with cost of $18.30/mile for a grand total of $5,846. [facility] is not able to transport patient before Monday. CM has updated daughter and daughter states that family is working with [employer] for resources that might cover the expense. Daughter also states that family has been working with transport through patient insurance with [employer] transportation but they will only cover 25 mile past the state [place]. Daughter is not able to make a decision at this time.   Currently there is no inpatient care manager need. CM has no resources to cover travel expense.   Expected Discharge Plan: Home/Self Care Barriers to Discharge: Continued Medical Work up   Patient Goals and CMS Choice Patient states their goals for this hospitalization and ongoing recovery are:: Wants to return home with daughter   Choice offered to / list presented to : NA [place] ownership interest in [facility].provided to::  (n/a)    Expected Discharge Plan and Services In-house Referral: NA Discharge Planning Services: CM Consult Post Acute Care Choice: NA Living arrangements for the past 2 months:  Apartment                 DME Arranged: N/A DME Agency: NA       HH Arranged: NA HH Agency: NA        Prior Living Arrangements/Services Living arrangements for the past 2 months: Apartment Lives with:: Adult Children Patient language and need for interpreter reviewed:: Yes Do you feel safe going back to the place where you live?: Yes      Need for Family Participation in Patient Care: Yes (Comment) Care giver support system in place?: Yes (comment) Current home services:  (n/a) Criminal Activity/Legal Involvement Pertinent to Current Situation/Hospitalization: No - Comment as needed  Activities of Daily Living   ADL Screening (condition at time of admission) Independently performs ADLs?: No Does the patient have a NEW difficulty with bathing/dressing/toileting/self-feeding that is expected to last >3 days?: No Does the patient have a NEW difficulty with getting in/out of bed, walking, or climbing stairs that is expected to last >3 days?: No Does the patient have a NEW difficulty with communication that is expected to last >3 days?: No Is the patient deaf or have difficulty hearing?: No Does the patient have difficulty seeing, even when wearing glasses/contacts?: No Does the patient have difficulty concentrating, remembering, or making decisions?: No  Permission Sought/Granted Permission sought to share information with : Family Supports    Share Information with NAME: [name] [phone]     Permission granted to share info w Relationship: daughter  Permission granted to  share info w Contact Information: [phone]  Emotional Assessment Appearance:: Appears stated age Attitude/Demeanor/Rapport: Other (comment) (quiet unable to assess) Affect (typically observed): Flat Orientation: : Oriented to Self (unable to assess, does not answer questions) Alcohol / Substance Use: Not Applicable Psych Involvement: No (comment)  Admission diagnosis:  Obstructive  uropathy [N13.9] Cancer associated pain [G89.3] Lower abdominal pain [R10.30] Acute UTI (urinary tract infection) [N39.0] Elevated alkaline phosphatase level [R74.8] Urinary tract infection with hematuria, site unspecified [N39.0, R31.9] Patient Active Problem List   Diagnosis Date Noted   Acute UTI (urinary tract infection) 02/08/2024   Hydroureter on right 02/08/2024   Hydronephrosis of right kidney 02/08/2024   Moderate protein malnutrition 02/08/2024   High risk medication use 06/09/2023   Drug-induced constipation 06/09/2023   Counseling and coordination of care 06/09/2023   Medication management 06/09/2023   Palliative care encounter 06/09/2023   Malignant neoplasm metastatic to bone (HCC) 06/09/2023   Metastatic leiomyosarcoma of bone (HCC) 06/08/2023   Chronic pain syndrome 06/08/2023   Generalized anxiety disorder 06/08/2023   Cancer-related breakthrough pain from Metastatic leiomyosarcoma of the bone /Chronic pain syndrome 06/08/2023   Medical non-compliance 05/26/2023   Leiomyosarcoma (HCC) 05/16/2023   Cancer associated pain 05/16/2023   Goals of care, counseling/discussion 05/16/2023   Physical debility 05/16/2023   Cortical age-related cataract of right eye 09/30/2021   Cortical age-related cataract of left eye 09/30/2021   Anatomical narrow angle, bilateral 09/24/2021   History of hepatitis C 03/15/2021   Hyperlipidemia associated with type 2 diabetes mellitus (HCC) 06/11/2020   Type 2 diabetes mellitus without complication, without long-term current use of insulin (HCC) 06/11/2020   DDD (degenerative disc disease), lumbar 11/25/2019   Seasonal allergic rhinitis due to pollen 07/28/2019   DDD (degenerative disc disease), cervical 01/02/2019   Spinal stenosis 01/18/2017   Insomnia 01/18/2017   Lumbar herniated disc 09/05/2016   Cervical radiculopathy 09/05/2016   Neuropathy 09/05/2016   Depression 08/15/2016   Inguinal lymphadenopathy 06/30/2016    Thrombocytopenia 05/24/2016   Dehydration 05/24/2016   Anxiety 05/24/2016   Nausea and vomiting 05/17/2016   Leukopenia due to antineoplastic chemotherapy 05/12/2016   Hypomagnesemia 05/12/2016   Hypokalemia 05/12/2016   Anemia due to antineoplastic chemotherapy 05/04/2016   Continuous tobacco abuse 04/01/2016   Poor dentition 04/01/2016   Essential hypertension 04/01/2016   Methadone  use 04/01/2016   Allergic sinusitis 04/01/2016   GERD (gastroesophageal reflux disease) 04/01/2016   Cervical cancer (HCC) 03/03/2016   PCP:  Delbert Clam, MD Pharmacy:   [facility] [address] [place] [address] Phone: [phone] Fax: [phone]     Social Drivers of Health (SDOH) Social History: SDOH Screenings   Food Insecurity: No Food Insecurity (02/08/2024)  Housing: Low Risk  (02/08/2024)  Transportation Needs: No Transportation Needs (02/08/2024)  Utilities: Not At Risk (02/08/2024)  Depression (PHQ2-9): Low Risk  (04/13/2023)  Social Connections: Socially Isolated (02/08/2024)  Tobacco Use: High Risk (02/08/2024)  Health Literacy: Adequate Health Literacy (04/19/2023)   Received from [facility]   SDOH Interventions:     Readmission Risk Interventions    02/09/2024    3:37 PM  Readmission Risk Prevention Plan  Transportation Screening Complete  Medication Review Oceanographer) Complete  SW Recovery Care/Counseling Consult Complete  Palliative Care Screening Not Applicable  Skilled Nursing Facility Not Applicable

## 2024-02-10 DIAGNOSIS — N390 Urinary tract infection, site not specified: Secondary | ICD-10-CM | POA: Diagnosis not present

## 2024-02-10 LAB — CBC
HCT: 32.5 % — ABNORMAL LOW (ref 36.0–46.0)
Hemoglobin: 9.8 g/dL — ABNORMAL LOW (ref 12.0–15.0)
MCH: 27.3 pg (ref 26.0–34.0)
MCHC: 30.2 g/dL (ref 30.0–36.0)
MCV: 90.5 fL (ref 80.0–100.0)
Platelets: 402 K/uL — ABNORMAL HIGH (ref 150–400)
RBC: 3.59 MIL/uL — ABNORMAL LOW (ref 3.87–5.11)
RDW: 15.8 % — ABNORMAL HIGH (ref 11.5–15.5)
WBC: 12.2 K/uL — ABNORMAL HIGH (ref 4.0–10.5)
nRBC: 0 % (ref 0.0–0.2)

## 2024-02-10 LAB — BASIC METABOLIC PANEL WITH GFR
Anion gap: 10 (ref 5–15)
BUN: 17 mg/dL (ref 8–23)
CO2: 27 mmol/L (ref 22–32)
Calcium: 9.5 mg/dL (ref 8.9–10.3)
Chloride: 105 mmol/L (ref 98–111)
Creatinine, Ser: 0.55 mg/dL (ref 0.44–1.00)
GFR, Estimated: >60 mL/min (ref >60)
Glucose, Bld: 87 mg/dL (ref 70–99)
Potassium: 3.7 mmol/L (ref 3.5–5.1)
Sodium: 143 mmol/L (ref 135–145)

## 2024-02-10 LAB — GLUCOSE, CAPILLARY
Glucose-Capillary: 105 mg/dL — ABNORMAL HIGH (ref 70–99)
Glucose-Capillary: 81 mg/dL (ref 70–99)
Glucose-Capillary: 84 mg/dL (ref 70–99)
Glucose-Capillary: 92 mg/dL (ref 70–99)
Glucose-Capillary: 94 mg/dL (ref 70–99)
Glucose-Capillary: 95 mg/dL (ref 70–99)

## 2024-02-10 LAB — URINE CULTURE: Culture: >=100000 — AB

## 2024-02-10 MED ORDER — FAMOTIDINE IN NACL 20-0.9 MG/50ML-% IV SOLN
20.0000 mg | Freq: Once | INTRAVENOUS | Status: AC
  Administered 2024-02-10: 20 mg via INTRAVENOUS
  Filled 2024-02-10: qty 50

## 2024-02-10 MED ORDER — ADULT MULTIVITAMIN W/MINERALS CH
1.0000 | ORAL_TABLET | Freq: Every day | ORAL | Status: DC
  Administered 2024-02-13 – 2024-02-16 (×4): 1 via ORAL
  Filled 2024-02-10 (×6): qty 1

## 2024-02-10 MED ORDER — SODIUM CHLORIDE 0.9 % IV SOLN: INTRAVENOUS | Status: AC | PRN

## 2024-02-10 MED ORDER — ENSURE PLUS HIGH PROTEIN PO LIQD
237.0000 mL | Freq: Two times a day (BID) | ORAL | Status: DC
  Administered 2024-02-13: 237 mL via ORAL

## 2024-02-10 NOTE — Progress Notes (Addendum)
     Patient Name: Valerie Kerr           DOB: 09-26-1960  MRN: [id number]      Admission Date: 02/08/2024  Attending Provider: Jonel Lonni SQUIBB, *  Primary Diagnosis: Acute UTI (urinary tract infection)   Level of care: Med-Surg   OVERNIGHT EVENT   Daughter expressed concern that the patient has not received her usual home pain medications -- methadone , hydromorphone , oxycodone --- and fears that patient may begin to experience withdrawal.  Family demonstrates some gaps in understanding of withdrawal presentation, but they are receptive to education.  Patient is currently resting comfortably in bed.  At this time, the patient shows no clinical signs of withdrawal: no anxiety, restlessness, diaphoresis, nausea, vomiting, or tremors observed. She recently received IV hydromorphone  and is drowsy but arousable to voice and touch; however, she does not remain awake long enough to safely take oral medications. She reports heartburn.  Family voiced worry that untreated withdrawal could cause the patient significant harm. After education, they verbalize understanding that the patient is not showing symptoms of withdrawal and express reassurance knowing that RN staff will closely monitor for any emerging withdrawal signs using the COWS assessment.  They are open to a palliative consult for pain management.  [facility] to follow-up on this.   Ciaran Begay, DNP, ACNPC- [facility] [place]

## 2024-02-10 NOTE — Progress Notes (Signed)
  Progress Note   Patient: Valerie Kerr FMW:[id number] DOB: 01-09-1961 DOA: 02/08/2024     2 DOS: the patient was seen and examined on 02/10/2024 at 9:30AM      Brief hospital course: 63 y.o. 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 - Continue Rocephin, day 2  - Consult TOC for assistance with transport to [place]      Chronic pain syndrome Noted the significant discrepancy between outpatient prescribed pain regimen and inpatient pain requests.  Currently, sleepy but rousable.  Denies pain.  Reports heartburn, which is being treated.  No evidence of sweating, mydriasis, tremor, skin dimpling nor any other observed signs of withdrawal. 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  No nursing concerns.  The patient denies pain.  She reports some mild heartburn.  Her aunt is at the bedside.     Physical Exam: BP 125/77 (BP Location: Right Arm)   Pulse (!) 103   Temp 98.4 F (36.9 C)   Resp 18   Ht 5' 1 (1.549 m)   Wt 68 kg   SpO2 100%   BMI 28.34 kg/m   Adult female, frail, mostly unresponsive, pale, opens eyes briefly, nods head yes or no, then falls back asleep Tachycardic, regular, no murmurs, skin normal, no swelling or gooseflesh Respiratory rate normal, lungs diminished but no rales or wheezes appreciated Abdomen  without pain to palpation Attention diminished, severe generalized weakness, no tremor    Data Reviewed: Basic metabolic panel shows normal electrolytes and renal function CBC shows improving leukocytosis    Family Communication: Aunt at bedside    Disposition: Status is: Inpatient         Author: Lonni SHAUNNA Dalton, MD 02/10/2024 2:40 PM  For on call review [email or web address].

## 2024-02-10 NOTE — Progress Notes (Signed)
 Initial Nutrition Assessment  DOCUMENTATION CODES:  Not applicable  INTERVENTION:  Multivitamin w/ minerals daily Liberalize diet to regular given increased needs for chronic illness and noted weight loss Encourage good PO intake Ok for family to bring outside food for patient as desired Ensure Plus High Protein po BID, each supplement provides 350 kcal and 20 grams of protein  NUTRITION DIAGNOSIS:  Increased nutrient needs related to chronic illness (metastatic cancer) as evidenced by estimated needs.  GOAL:  Patient will meet greater than or equal to 90% of their needs  MONITOR:  PO intake, Supplement acceptance, Labs, Weight trends  REASON FOR ASSESSMENT:  Consult Assessment of nutrition requirement/status  ASSESSMENT:  62 y.o. female presented to the ED with worsening abdominal pain. PMH includes HTN, GERD, HLD, T2DM, and cervical cancer s/p radiation. Pt admitted with UTI.   11/13 - Admitted   RD working remotely at time of assessment. Patient noted to be oriented x4, but drowsy. Discussed with RN, RN reports that pt has not wanted to eat and does not think she ate dinner either last night. Discussed liberalizing diet and ordering oral nutrition supplements with RN.  Meal Intake 11/14: 0% breakfast, lunch, dinner  Per chart review, pt current weight appears to be stated. Overall, pt with a 11% weight loss within the past 11 months.   Admission/Current Weight: 68 kg   Nutrition Related Medications: IV antibiotics  Labs: Sodium 143, Potassium 3.7, BUN 17, Creatinine 0.55, Hgb A1c 10.2  CBG: 75-105 mg/dL x 24 hrs   NUTRITION - FOCUSED PHYSICAL EXAM: Deferred to follow-up.   Diet Order:   Diet Order             Diet heart healthy/carb modified Fluid consistency: Thin  Diet effective now                  EDUCATION NEEDS:  No education needs have been identified at this time  Skin:  Skin Assessment: Reviewed RN Assessment  Last BM:  Unknown  Height:   Ht Readings from Last 1 Encounters:  02/08/24 5' 1 (1.549 m)   Weight:  Wt Readings from Last 1 Encounters:  02/08/24 68 kg   Ideal Body Weight:  47.7 kg  BMI:  Body mass index is 28.34 kg/m.  Estimated Nutritional Needs:  Kcal:  1700-1900 Protein:  85-105 grams Fluid:  >/= 1.7 L   Nestora Glatter RD, LDN Clinical Dietitian

## 2024-02-10 NOTE — Plan of Care (Signed)
  Problem: Education: Goal: Ability to describe self-care measures that may prevent or decrease complications (Diabetes Survival Skills Education) will improve Outcome: Progressing   Problem: Elimination: Goal: Will not experience complications related to urinary retention Outcome: Progressing   Problem: Safety: Goal: Ability to remain free from injury will improve Outcome: Progressing   Problem: Skin Integrity: Goal: Risk for impaired skin integrity will decrease Outcome: Progressing

## 2024-02-11 DIAGNOSIS — N390 Urinary tract infection, site not specified: Secondary | ICD-10-CM | POA: Diagnosis not present

## 2024-02-11 LAB — BASIC METABOLIC PANEL WITH GFR
Anion gap: 9 (ref 5–15)
BUN: 17 mg/dL (ref 8–23)
CO2: 29 mmol/L (ref 22–32)
Calcium: 9.5 mg/dL (ref 8.9–10.3)
Chloride: 107 mmol/L (ref 98–111)
Creatinine, Ser: 0.61 mg/dL (ref 0.44–1.00)
GFR, Estimated: >60 mL/min (ref >60)
Glucose, Bld: 152 mg/dL — ABNORMAL HIGH (ref 70–99)
Potassium: 3.4 mmol/L — ABNORMAL LOW (ref 3.5–5.1)
Sodium: 145 mmol/L (ref 135–145)

## 2024-02-11 LAB — GLUCOSE, CAPILLARY
Glucose-Capillary: 108 mg/dL — ABNORMAL HIGH (ref 70–99)
Glucose-Capillary: 111 mg/dL — ABNORMAL HIGH (ref 70–99)
Glucose-Capillary: 112 mg/dL — ABNORMAL HIGH (ref 70–99)
Glucose-Capillary: 144 mg/dL — ABNORMAL HIGH (ref 70–99)
Glucose-Capillary: 187 mg/dL — ABNORMAL HIGH (ref 70–99)
Glucose-Capillary: 69 mg/dL — ABNORMAL LOW (ref 70–99)
Glucose-Capillary: 84 mg/dL (ref 70–99)

## 2024-02-11 LAB — CBC
HCT: 32.2 % — ABNORMAL LOW (ref 36.0–46.0)
Hemoglobin: 9.6 g/dL — ABNORMAL LOW (ref 12.0–15.0)
MCH: 27.3 pg (ref 26.0–34.0)
MCHC: 29.8 g/dL — ABNORMAL LOW (ref 30.0–36.0)
MCV: 91.5 fL (ref 80.0–100.0)
Platelets: 405 K/uL — ABNORMAL HIGH (ref 150–400)
RBC: 3.52 MIL/uL — ABNORMAL LOW (ref 3.87–5.11)
RDW: 15.9 % — ABNORMAL HIGH (ref 11.5–15.5)
WBC: 9 K/uL (ref 4.0–10.5)
nRBC: 0 % (ref 0.0–0.2)

## 2024-02-11 MED ORDER — PANTOPRAZOLE SODIUM 40 MG PO TBEC
40.0000 mg | DELAYED_RELEASE_TABLET | Freq: Two times a day (BID) | ORAL | Status: DC
  Administered 2024-02-11 – 2024-02-16 (×9): 40 mg via ORAL
  Filled 2024-02-11 (×8): qty 1

## 2024-02-11 MED ORDER — CALCIUM CARBONATE ANTACID 500 MG PO CHEW
400.0000 mg | CHEWABLE_TABLET | Freq: Three times a day (TID) | ORAL | Status: DC
  Administered 2024-02-11: 400 mg via ORAL
  Filled 2024-02-11 (×5): qty 2

## 2024-02-11 NOTE — Plan of Care (Signed)

## 2024-02-11 NOTE — Plan of Care (Signed)
  Problem: Coping: Goal: Ability to adjust to condition or change in health will improve Outcome: Progressing   Problem: Fluid Volume: Goal: Ability to maintain a balanced intake and output will improve Outcome: Progressing   Problem: Health Behavior/Discharge Planning: Goal: Ability to manage health-related needs will improve Outcome: Progressing   Problem: Metabolic: Goal: Ability to maintain appropriate glucose levels will improve Outcome: Progressing   Problem: Tissue Perfusion: Goal: Adequacy of tissue perfusion will improve Outcome: Progressing   Problem: Health Behavior/Discharge Planning: Goal: Ability to manage health-related needs will improve Outcome: Progressing   Problem: Clinical Measurements: Goal: Ability to maintain clinical measurements within normal limits will improve Outcome: Progressing Goal: Will remain free from infection Outcome: Progressing   Problem: Nutrition: Goal: Adequate nutrition will be maintained Outcome: Progressing   Problem: Elimination: Goal: Will not experience complications related to bowel motility Outcome: Progressing   Problem: Pain Managment: Goal: General experience of comfort will improve and/or be controlled Outcome: Progressing   Problem: Safety: Goal: Ability to remain free from injury will improve Outcome: Progressing   Problem: Skin Integrity: Goal: Risk for impaired skin integrity will decrease Outcome: Progressing

## 2024-02-11 NOTE — Consult Note (Signed)
 Consultation Note Date: 02/11/2024   Patient Name: Valerie Kerr  DOB: November 02, 1960  MRN: [id number]  Age / Sex: 63 y.o., female  PCP: Delbert Clam, MD Referring Physician: Jonel Lonni SQUIBB, *  Reason for Consultation: Pain control  HPI/Patient Profile: 63 y.o. female admitted on 02/08/2024    Clinical Assessment and Goals of Care: 63 year old lady with prior history of cervical cancer in remission, new diagnosis of metastatic leiomyosarcoma, failure to thrive, severe chronic pain, was recently connected with [facility] of the [place] but has since revoked hospice services, admitted to hospital medicine service, hydroureter secondary to malignant obstruction and bladder invasion of the sarcoma.  Also has chronic pain syndrome hypertension and anemia due to malignancy. TOC working on patient and daughter's request for the patient to be transferred to [place]  to be closer to her daughter towards the end of this hospitalization.  Hospice services have been revoked.  CODE STATUS is DNR-limited.  Of note, patient follows with palliative care at the cancer center.  Previously, she was on oral methadone , oral hydromorphone  as well as adjuvants such as Lyrica . At present, patient is on IV Dilaudid  to be used on an as needed basis, medication history checked, no doses used in the last 24 hours. Palliative consult for pain management Patient awakens easily, complains of mild to moderate degree of abdominal and back discomfort.  Niece at bedside. Palliative medicine is specialized medical care for people living with serious illness. It focuses on providing relief from the symptoms and stress of a serious illness. The goal is to improve quality of life for both the patient and the family. Goals of care: Broad aims of medical therapy in relation to the patient's values and preferences. Our aim is to provide medical care  aimed at enabling patients to achieve the goals that matter most to them, given the circumstances of their particular medical situation and their constraints.    NEXT OF KIN Daughter lives in [place] , niece at bedside.  SUMMARY OF RECOMMENDATIONS    Pain Control:  IV Dilaudid  PRN, agree, continue the current pain regimen. Will monitor patient's current pain management needs so as to recommend a safe PO opioid regimen on discharge.  Thank you for the consult.   Code Status/Advance Care Planning: DNR   Symptom Management:     Palliative Prophylaxis:  Frequent Pain Assessment   Psycho-social/Spiritual:  Desire for further Chaplaincy support:yes Additional Recommendations: Education on Hospice  Prognosis:  Unable to determine  Discharge Planning: To Be Determined      Primary Diagnoses: Present on Admission:  Acute UTI (urinary tract infection)  Essential hypertension  GERD (gastroesophageal reflux disease)  Hydroureter on right  Hydronephrosis of right kidney  Cancer-related breakthrough pain from Metastatic leiomyosarcoma of the bone /Chronic pain syndrome  Anemia due to antineoplastic chemotherapy  Moderate protein malnutrition   I have reviewed the medical record, interviewed the patient and family, and examined the patient. The following aspects are pertinent.  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---  treatment concurrent chemo (week 6 on hold due to pancytopenia) and radiation therapy (external beam 04-12-2016 to 05-31-2016)  started high-dose rate brachytherapy 05-26-2016   Chemotherapy-induced thrombocytopenia    Chronic pain disorder    back,neck-- chronic methadone    Environmental allergies    allergy to dust mites   GERD (gastroesophageal reflux disease)    Headache    Herniated disc, cervical    History of  external beam radiation therapy 04/12/16-05/31/16   pelvis 45 Gy in 25 fractions, in  30 sessions, pelvis boost 9 Gy in 5 fractions   History of radiation therapy 06/20/16-07/04/16   tandem and ring applicator to cervix 28 Gy in 5 fractions   History of radiation therapy    Pelvis(sacrum) 05/18/23-06/16/23-Dr. Lynwood Shannon   Hypertension    Hypokalemia    Hypomagnesemia    po supplement and IV replacement   Leukopenia due to antineoplastic chemotherapy    Lumbar herniated disc    Pancytopenia due to chemotherapy    Periodontitis    Social History   Socioeconomic History   Marital status: Divorced    Spouse name: Not on file   Number of children: 5   Years of education: Not on file   Highest education level: Not on file  Occupational History   Occupation: disabled  Tobacco Use   Smoking status: Some Days    Current packs/day: 0.20    Average packs/day: 0.2 packs/day for 45.0 years (9.0 ttl pk-yrs)    Types: Cigarettes   Smokeless tobacco: Never   Tobacco comments:    1 week since last cigarette  Vaping Use   Vaping status: Never Used  Substance and Sexual Activity   Alcohol use: No   Drug use: No   Sexual activity: Not Currently  Other Topics Concern   Not on file  Social History Narrative   Lives with niece in a 2 story home.  Has 5 children.  On disability.  Formerly worked in stage manager at [employer].  Education: 11th grade.    Social Drivers of Corporate Investment Banker Strain: Not on file  Food Insecurity: No Food Insecurity (02/08/2024)   Hunger Vital Sign    Worried About Running Out of Food in the Last Year: Never true    Ran Out of Food in the Last Year: Never true  Transportation Needs: No Transportation Needs (02/08/2024)   PRAPARE - Administrator, Civil Service (Medical): No    Lack of Transportation (Non-Medical): No  Physical Activity: Not on file  Stress: Not on file  Social Connections: Socially Isolated (02/08/2024)   Social  Connection and Isolation Panel    Frequency of Communication with Friends and Family: More than three times a week    Frequency of Social Gatherings with Friends and Family: Once a week    Attends Religious Services: Never    Database Administrator or Organizations: No    Attends Banker Meetings: Never    Marital Status: Widowed   Family History  Problem Relation Age of Onset   Cancer Father    Cancer Sister        throat   Cancer Brother        throat and stomach   Cancer Brother        lung   Scheduled Meds:  calcium  carbonate  400 mg  of elemental calcium  Oral TID WC   Chlorhexidine Gluconate Cloth  6 each Topical Daily   enoxaparin  (LOVENOX ) injection  40 mg Subcutaneous Q24H   feeding supplement  237 mL Oral BID BM   multivitamin with minerals  1 tablet Oral Daily   pantoprazole   40 mg Oral BID   Continuous Infusions:  cefTRIAXone (ROCEPHIN)  IV 1 g (02/11/24 0816)   PRN Meds:.acetaminophen  **OR** acetaminophen , dextrose , HYDROmorphone  (DILAUDID ) injection, ondansetron  **OR** ondansetron  (ZOFRAN ) IV Medications Prior to Admission:  Prior to Admission medications   Medication Sig Start Date End Date Taking? Authorizing Provider  ALPRAZolam  (XANAX ) 0.25 MG tablet Take 2 tablets (0.5 mg total) by mouth every 12 (twelve) hours as needed for anxiety. 01/05/24  Yes   azelastine  (ASTELIN ) 0.1 % nasal spray Spray 1 spray into both nostrils twice a day as needed for allergies 12/12/23  Yes   HYDROmorphone  (DILAUDID ) 8 MG tablet Take 2 tablet by mouth every three hours as needed for pain or shortness of breath 02/06/24  Yes   ibuprofen  (ADVIL ) 600 MG tablet Take 1 tablet (600 mg total) by mouth every 8 (eight) hours as needed for moderate pain (pain score 4-6) or mild pain (pain score 1-3). 09/25/23  Yes Pickenpack-Cousar, Fannie SAILOR, NP  lidocaine  (LIDODERM ) 5 % Place 1 patch onto the skin daily. Remove & Discard patch within 12 hours or as directed by MD Patient taking  differently: Place 1 patch onto the skin daily as needed (for pain- Remove & Discard patch within 12 hours or as directed by MD). 08/07/23  Yes Pickenpack-Cousar, Fannie SAILOR, NP  methadone  (DOLOPHINE ) 10 MG tablet Take 7 tablets (70mg ) in the morning, 7 tablets (70mg ) at 2pm, and 4 tablets at bedtime Patient taking differently: Take 10 mg by mouth See admin instructions. Take 7 tablets (70mg ) in morning, 4 tablets (40mg ) in the afternoon, and 4 tablet (40mg ) at bedtime 02/03/24  Yes   omeprazole  (PRILOSEC) 20 MG capsule Take 1 capsule (20 mg total) by mouth daily for GERD. 01/10/24  Yes   oxycodone  (ROXICODONE ) 30 MG immediate release tablet Take 1.5 tablet by mouth every six hours as needed for pain or shortness of breath 02/06/24  Yes   senna-docusate (EQ STOOL SOFTENER/LAXATIVE) 8.6-50 MG tablet Take 2 tablets by mouth at bedtime as needed for constipation 01/23/24  Yes   bisacodyl  (DULCOLAX) 10 MG suppository Place 1 suppository (10 mg total) rectally at bedtime if patient has not had a bowel movement for 3 days.  (constipation) Patient not taking: Reported on 02/09/2024 01/23/24     dexamethasone  (DECADRON ) 4 MG tablet Take 2 tablets (8 mg total) by mouth daily. Patient not taking: Reported on 02/09/2024 12/11/23     dexamethasone  (DECADRON ) 4 MG tablet Take 2 tablets (8 mg total) by mouth daily. Patient not taking: Reported on 02/09/2024 12/25/23     dexamethasone  (DECADRON ) 4 MG tablet Take 2 tablets (8 mg total) by mouth daily for pain Patient not taking: Reported on 02/09/2024 01/05/24     fluticasone  (FLONASE ) 50 MCG/ACT nasal spray Place 1 spray into both nostrils daily. Patient not taking: Reported on 02/09/2024 12/04/23     lisinopril -hydrochlorothiazide  (ZESTORETIC ) 10-12.5 MG tablet Take 1 tablet by mouth daily for hypertension. Patient not taking: Reported on 02/09/2024 11/22/23     Menthol , Topical Analgesic, (BIOFREEZE) 10 % CREA Apply topically 3 (three) times daily as needed. Patient  not taking: Reported on 02/09/2024 08/07/23   Pickenpack-Cousar, Athena N, NP  Menthol , Topical Analgesic, (  BIOFREEZE) 10 % CREA Apply topically as needed every 8 Hours for pain. Patient not taking: Reported on 02/09/2024 01/10/24     omeprazole  (PRILOSEC) 20 MG capsule Take 1 capsule (20 mg total) by mouth daily. Patient not taking: Reported on 02/09/2024 12/25/23     ondansetron  (ZOFRAN ) 4 MG tablet Take 1 tablet (4 mg total) by mouth every 6 (six) hours as needed for nausea. Patient not taking: Reported on 02/09/2024 10/10/23   Will Almarie MATSU, MD  OVER THE COUNTER MEDICATION Take 1 tablet by mouth See admin instructions. Equate Severe Sinus Congestions & Pain Caplets- Take 1 caplet by mouth every four hours as needed for sinus pain Patient not taking: Reported on 02/09/2024    [provider]  polyethylene glycol powder (MIRALAX ) 17 GM/SCOOP powder Take 1 scoop in 8 oz of liquid one time daily as needed for constipation Patient not taking: Reported on 02/09/2024 01/19/24     pregabalin  (LYRICA ) 100 MG capsule Take 1 capsule (100 mg total) by mouth 3 (three) times daily for neuropathy Patient not taking: Reported on 02/09/2024 12/07/23     senna-docusate (SENOKOT-S) 8.6-50 MG tablet Take 2 tablets by mouth at bedtime. Patient not taking: Reported on 02/09/2024 10/10/23   Will Almarie MATSU, MD   Allergies  Allergen Reactions   Flexeril  [Cyclobenzaprine ] Other (See Comments)    Caused a lot of sedation and did not work   Codeine Itching and Rash   Fentanyl  Swelling, Rash and Other (See Comments)    Skin rash, local swelling from patch   Tramadol Nausea Only and Other (See Comments)    GI upset,also trembling sensation   Review of Systems Complains of abdominal and back discomfort. Physical Exam Weak appearing lady resting in bed Appears with generalized weakness Shallow regular breath sounds Abdomen soft however with generalized mild tenderness Flat affect  Vital  Signs: BP 133/75 (BP Location: Right Arm)   Pulse 95   Temp 98 F (36.7 C)   Resp 18   Ht 5' 1 (1.549 m)   Wt 68 kg   SpO2 100%   BMI 28.34 kg/m  Pain Scale: 0-10 POSS *See Group Information*: 1-Acceptable,Awake and alert Pain Score: 8    SpO2: SpO2: 100 % O2 Device:SpO2: 100 % O2 Flow Rate: .O2 Flow Rate (L/min): 2 L/min  IO: Intake/output summary:  Intake/Output Summary (Last 24 hours) at 02/11/2024 1313 Last data filed at 02/11/2024 0600 Gross per 24 hour  Intake 0 ml  Output 350 ml  Net -350 ml    LBM: Last BM Date :  (PTA) Baseline Weight: Weight: 68 kg Most recent weight: Weight: 68 kg     Palliative Assessment/Data:   PPS 40%  Time In:  12 Time Out:  1315 Time Total:  75 Greater than 50%  of this time was spent counseling and coordinating care related to the above assessment and plan.  Signed by: Lonia Serve, MD   Please contact Palliative Medicine Team phone at [phone] for questions and concerns.  For individual provider: See Tracey

## 2024-02-11 NOTE — Progress Notes (Signed)
  Progress Note   Patient: Valerie Kerr FMW:[id number] DOB: [birth year] DOA: 02/08/2024     3 DOS: the patient was seen and examined on 02/11/2024 at 8:55AM      Brief hospital course: 63 y.o. 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  She has had no fever, no pain complaints.     Physical Exam: BP 133/75 (BP Location: Right Arm)   Pulse 95   Temp 98 F (36.7 C)   Resp 18   Ht 5' 1 (1.549 m)   Wt 68 kg   SpO2 100%   BMI 28.34 kg/m   Adult female, frail, lying in bed, opens eyes, asked for some cold water , interactive and appropriate Appears very tired and frail Tachycardic, regular, no murmurs, skin normal Respiratory rate normal, lungs normal, no rales or wheezes appreciated Abdomen soft, grimaces a little bit to palpation, no masses or distention appreciated Response to questions, eyes open, oriented to [facility], severe generalized weakness but symmetric, speech fluent    Data  Reviewed: Basic metabolic panel shows mild hypokalemia CBC shows mild anemia     Family Communication: Niece at the bedside   Disposition: Status is: Inpatient         Author: Lonni SHAUNNA Dalton, MD 02/11/2024 11:47 AM  For on call review [email or web address].

## 2024-02-12 DIAGNOSIS — N390 Urinary tract infection, site not specified: Secondary | ICD-10-CM | POA: Diagnosis not present

## 2024-02-12 LAB — GLUCOSE, CAPILLARY
Glucose-Capillary: 97 mg/dL (ref 70–99)
Glucose-Capillary: 124 mg/dL — ABNORMAL HIGH (ref 70–99)
Glucose-Capillary: 77 mg/dL (ref 70–99)
Glucose-Capillary: 71 mg/dL (ref 70–99)

## 2024-02-12 MED ORDER — ALPRAZOLAM 0.5 MG PO TABS
0.5000 mg | ORAL_TABLET | Freq: Three times a day (TID) | ORAL | Status: DC | PRN
  Administered 2024-02-12 – 2024-02-16 (×7): 0.5 mg via ORAL
  Filled 2024-02-12 (×7): qty 1

## 2024-02-12 MED ORDER — HYDROMORPHONE HCL 4 MG PO TABS: 8.0000 mg | ORAL_TABLET | ORAL | Status: DC | PRN

## 2024-02-12 NOTE — TOC Progression Note (Signed)
 Transition of Care [facility]) - Progression Note    Patient Details  Name: Valerie Kerr MRN: [id number] Date of Birth: 03/24/1961  Transition of [facility]) CM/SW Contact  Heather DELENA Saltness, LCSW Phone Number: 02/12/2024, 3:38 PM  Clinical Narrative:    CSW attempted to speak with pt's daughter, Deland Dao [phone], via phone call to discuss discharge planning and obtain update regarding transportation to [place]. No answer, voicemail left. TOC will continue to follow.   Expected Discharge Plan: Home/Self Care Barriers to Discharge: Continued Medical Work up   Expected Discharge Plan and Services In-house Referral: NA Discharge Planning Services: CM Consult Post Acute Care Choice: NA Living arrangements for the past 2 months: Apartment                 DME Arranged: N/A DME Agency: NA       HH Arranged: NA HH Agency: NA         Social Drivers of Health (SDOH) Interventions SDOH Screenings   Food Insecurity: No Food Insecurity (02/08/2024)  Housing: Low Risk  (02/08/2024)  Transportation Needs: No Transportation Needs (02/08/2024)  Utilities: Not At Risk (02/08/2024)  Depression (PHQ2-9): Low Risk  (04/13/2023)  Social Connections: Socially Isolated (02/08/2024)  Tobacco Use: High Risk (02/08/2024)  Health Literacy: Adequate Health Literacy (04/19/2023)   Received from [facility]    Readmission Risk Interventions    02/09/2024    3:37 PM  Readmission Risk Prevention Plan  Transportation Screening Complete  Medication Review Oceanographer) Complete  SW Recovery Care/Counseling Consult Complete  Palliative Care Screening Not Applicable  Skilled Nursing Facility Not Applicable    Signed: Heather Saltness, MSW, LCSW Clinical Social Worker Inpatient Care Management 02/12/2024 3:39 PM

## 2024-02-12 NOTE — Plan of Care (Signed)
  Problem: Skin Integrity: Goal: Risk for impaired skin integrity will decrease Outcome: Progressing   Problem: Tissue Perfusion: Goal: Adequacy of tissue perfusion will improve Outcome: Progressing   Problem: Safety: Goal: Ability to remain free from injury will improve Outcome: Progressing   Problem: Skin Integrity: Goal: Risk for impaired skin integrity will decrease Outcome: Progressing

## 2024-02-12 NOTE — Progress Notes (Signed)
 Daily Progress Note   Patient Name: Valerie Kerr       Date: 02/12/2024 DOB: 01-24-1961  Age: 63 y.o. MRN#: [id number] Attending Physician: Jonel Lonni SQUIBB, * Primary Care Physician: Delbert Clam, MD Admit Date: 02/08/2024  Reason for Consultation/Follow-up: Pain control  Subjective: Awake alert, states pain is reasonably well controlled at the moment.   Length of Stay: 4  Current Medications: Scheduled Meds:  . calcium  carbonate  400 mg of elemental calcium  Oral TID WC  . Chlorhexidine Gluconate Cloth  6 each Topical Daily  . enoxaparin  (LOVENOX ) injection  40 mg Subcutaneous Q24H  . feeding supplement  237 mL Oral BID BM  . multivitamin with minerals  1 tablet Oral Daily  . pantoprazole   40 mg Oral BID    Continuous Infusions: . cefTRIAXone (ROCEPHIN)  IV 1 g (02/12/24 0855)    PRN Meds: acetaminophen  **OR** acetaminophen , dextrose , HYDROmorphone  (DILAUDID ) injection, ondansetron  **OR** ondansetron  (ZOFRAN ) IV  Physical Exam         Severe generalized weakness Flat affect - mostly avoids eye contact but responds with fluent speech Regular work of breathing Mild gen abd discomfort  Vital Signs: BP (!) 145/84 (BP Location: Right Arm)   Pulse 92   Temp 97.9 F (36.6 C) (Oral)   Resp 16   Ht 5' 1 (1.549 m)   Wt 68 kg   SpO2 100%   BMI 28.34 kg/m  SpO2: SpO2: 100 % O2 Device: O2 Device: Nasal Cannula O2 Flow Rate: O2 Flow Rate (L/min): 2 L/min  Intake/output summary:  Intake/Output Summary (Last 24 hours) at 02/12/2024 1040 Last data filed at 02/12/2024 0700 Gross per 24 hour  Intake 360 ml  Output 650 ml  Net -290 ml   LBM: Last BM Date :  (PTA) Baseline Weight: Weight: 68 kg Most recent weight: Weight: 68 kg       Palliative  Assessment/Data:      Patient Active Problem List   Diagnosis Date Noted  . Acute UTI (urinary tract infection) 02/08/2024  . Hydroureter on right 02/08/2024  . Hydronephrosis of right kidney 02/08/2024  . Moderate protein malnutrition 02/08/2024  . High risk medication use 06/09/2023  . Drug-induced constipation 06/09/2023  . Counseling and coordination of care 06/09/2023  . Medication management 06/09/2023  . Palliative care encounter 06/09/2023  .  Malignant neoplasm metastatic to bone (HCC) 06/09/2023  . Metastatic leiomyosarcoma of bone (HCC) 06/08/2023  . Chronic pain syndrome 06/08/2023  . Generalized anxiety disorder 06/08/2023  . Cancer-related breakthrough pain from Metastatic leiomyosarcoma of the bone /Chronic pain syndrome 06/08/2023  . Medical non-compliance 05/26/2023  . Leiomyosarcoma (HCC) 05/16/2023  . Cancer associated pain 05/16/2023  . Goals of care, counseling/discussion 05/16/2023  . Physical debility 05/16/2023  . Cortical age-related cataract of right eye 09/30/2021  . Cortical age-related cataract of left eye 09/30/2021  . Anatomical narrow angle, bilateral 09/24/2021  . History of hepatitis C 03/15/2021  . Hyperlipidemia associated with type 2 diabetes mellitus (HCC) 06/11/2020  . Type 2 diabetes mellitus without complication, without long-term current use of insulin (HCC) 06/11/2020  . DDD (degenerative disc disease), lumbar 11/25/2019  . Seasonal allergic rhinitis due to pollen 07/28/2019  . DDD (degenerative disc disease), cervical 01/02/2019  . Spinal stenosis 01/18/2017  . Insomnia 01/18/2017  . Lumbar herniated disc 09/05/2016  . Cervical radiculopathy 09/05/2016  . Neuropathy 09/05/2016  . Depression 08/15/2016  . Inguinal lymphadenopathy 06/30/2016  . Thrombocytopenia 05/24/2016  . Dehydration 05/24/2016  . Anxiety 05/24/2016  . Nausea and vomiting 05/17/2016  . Leukopenia due to antineoplastic chemotherapy 05/12/2016  . Hypomagnesemia  05/12/2016  . Hypokalemia 05/12/2016  . Anemia due to antineoplastic chemotherapy 05/04/2016  . Continuous tobacco abuse 04/01/2016  . Poor dentition 04/01/2016  . Essential hypertension 04/01/2016  . Methadone  use 04/01/2016  . Allergic sinusitis 04/01/2016  . GERD (gastroesophageal reflux disease) 04/01/2016  . Cervical cancer (HCC) 03/03/2016    Palliative Care Assessment & Plan   Patient Profile:    Assessment: 63 year old lady with prior history of cervical cancer in remission, new diagnosis of metastatic leiomyosarcoma, failure to thrive, severe chronic pain, was recently connected with hospice of the [place] but has since revoked hospice services, admitted to hospital medicine service, hydroureter secondary to malignant obstruction and bladder invasion of the sarcoma.  Also has chronic pain syndrome hypertension and anemia due to malignancy. TOC working on patient and daughter's request for the patient to be transferred to [place]  to be closer to her daughter towards the end of this hospitalization.  Hospice services have been revoked.  CODE STATUS is DNR-limited.  Of note, patient follows with palliative care at the cancer center.  Previously, she was on oral methadone , oral hydromorphone  as well as adjuvants such as Lyrica . PMT for pain management.   Recommendations/Plan:   Dilaudid  IV PRN - 3 doses of 2 mg each in the past 24 hours, pain reasonably well controlled, continue to monitor.  DNR DNI Recommend palliative services if patient is able to be moved to daughter's place in [place] .   Code Status:    Code Status Orders  (From admission, onward)           Start     Ordered   02/08/24 0913  Do not attempt resuscitation (DNR)- Limited -Do Not Intubate (DNI)  Continuous       Question Answer Comment  If pulseless and not breathing No CPR or chest compressions.   In Pre-Arrest Conditions (Patient Is Breathing and Has A Pulse) Do not intubate. Provide all  appropriate non-invasive medical interventions. Avoid ICU transfer unless indicated or required.   Consent: Discussion documented in EHR or advanced directives reviewed      02/08/24 0913           Code Status History     Date Active Date Inactive Code Status  Order ID Comments User Context   10/11/2023 1446 10/17/2023 2025 Full Code [id number]  Zella Katha HERO, MD ED   10/11/2023 1423 10/11/2023 1446 Limited: Do not attempt resuscitation (DNR) -DNR-LIMITED -Do Not Intubate/DNI  [id number]  Zella Katha HERO, MD ED   10/07/2023 2119 10/10/2023 2246 Full Code [id number]  Waddell Rake, MD ED   06/08/2023 2149 06/19/2023 2236 Full Code [id number]  Lee Kingfisher, MD ED   04/27/2023 1038 04/28/2023 0504 Full Code [id number]  Alona Corners, DO HOV      Advance Directive Documentation    Flowsheet Row Most Recent Value  Type of Advance Directive Healthcare Power of Attorney, Living will  Pre-existing out of facility DNR order (yellow form or pink MOST form) --  MOST Form in Place? --    Prognosis:  guarded  Discharge Planning: To Be Determined Patient's daughter trying to get her moved to [place] and [facility] assisting with logistics.   Care plan was discussed with  patient and niece present at bedside.   Thank you for allowing the Palliative Medicine Team to assist in the care of this patient. High MDM.      Greater than 50%  of this time was spent counseling and coordinating care related to the above assessment and plan.  Lonia Serve, MD  Please contact Palliative Medicine Team phone at [phone] for questions and concerns.

## 2024-02-12 NOTE — Progress Notes (Signed)
 Lab came to draw labs this morning. Patient refused and stated that she does not want anymore labs or blood taken from her. Patient educated and dayshift nurse will be informed to follow up with pts provider.

## 2024-02-12 NOTE — Plan of Care (Signed)
  Problem: Metabolic: Goal: Ability to maintain appropriate glucose levels will improve Outcome: Progressing   Problem: Nutritional: Goal: Maintenance of adequate nutrition will improve Outcome: Progressing   Problem: Skin Integrity: Goal: Risk for impaired skin integrity will decrease Outcome: Progressing   Problem: Tissue Perfusion: Goal: Adequacy of tissue perfusion will improve Outcome: Progressing   Problem: Clinical Measurements: Goal: Ability to maintain clinical measurements within normal limits will improve Outcome: Progressing Goal: Will remain free from infection Outcome: Progressing Goal: Diagnostic test results will improve Outcome: Progressing   Problem: Elimination: Goal: Will not experience complications related to urinary retention Outcome: Progressing   Problem: Pain Managment: Goal: General experience of comfort will improve and/or be controlled Outcome: Progressing   Problem: Safety: Goal: Ability to remain free from injury will improve Outcome: Progressing   Problem: Skin Integrity: Goal: Risk for impaired skin integrity will decrease Outcome: Progressing

## 2024-02-12 NOTE — Progress Notes (Signed)
  Progress Note   Patient: Valerie Kerr FMW:[id number] DOB: [birth year] DOA: 02/08/2024     4 DOS: the patient was seen and examined on 02/12/2024        Brief hospital course: 63 y.o. 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     Physical Exam: BP 122/71 (BP Location: Left Arm)   Pulse 96   Temp 98.6 F (37 C) (Oral)   Resp 17   Ht 5' 1 (1.549 m)   Wt 68 kg   SpO2 100%   BMI 28.34 kg/m   Adult female, lying in bed, frail, opens and and answers questions, mostly does not make eye contact, eyes mostly closed Tachycardic, regular no murmurs, diffuse nonpitting edema Respiratory rate seems shallow, somewhat fast, diminished, no rales Abdomen tender diffusely nonlocalizing Attention diminished, generalized weakness, speech fluent, but limtied engagement    Family Communication: Younger Daughter at the bedside    Disposition: Status is: Inpatient         Author: Lonni SHAUNNA Dalton, MD 02/12/2024 5:46 PM  For on call review [email or web address].

## 2024-02-13 DIAGNOSIS — N134 Hydroureter: Secondary | ICD-10-CM

## 2024-02-13 DIAGNOSIS — N390 Urinary tract infection, site not specified: Secondary | ICD-10-CM

## 2024-02-13 DIAGNOSIS — G893 Neoplasm related pain (acute) (chronic): Secondary | ICD-10-CM

## 2024-02-13 DIAGNOSIS — Z7189 Other specified counseling: Secondary | ICD-10-CM

## 2024-02-13 DIAGNOSIS — C499 Malignant neoplasm of connective and soft tissue, unspecified: Secondary | ICD-10-CM

## 2024-02-13 DIAGNOSIS — Z515 Encounter for palliative care: Secondary | ICD-10-CM

## 2024-02-13 DIAGNOSIS — Z79899 Other long term (current) drug therapy: Secondary | ICD-10-CM

## 2024-02-13 DIAGNOSIS — R319 Hematuria, unspecified: Secondary | ICD-10-CM

## 2024-02-13 LAB — GLUCOSE, CAPILLARY
Glucose-Capillary: 63 mg/dL — ABNORMAL LOW (ref 70–99)
Glucose-Capillary: 78 mg/dL (ref 70–99)
Glucose-Capillary: 81 mg/dL (ref 70–99)
Glucose-Capillary: 97 mg/dL (ref 70–99)
Glucose-Capillary: 81 mg/dL (ref 70–99)

## 2024-02-13 MED ORDER — POLYETHYLENE GLYCOL 3350 17 G PO PACK
17.0000 g | PACK | Freq: Every day | ORAL | Status: DC
  Administered 2024-02-14 – 2024-02-16 (×2): 17 g via ORAL
  Filled 2024-02-13 (×2): qty 1

## 2024-02-13 MED ORDER — HYDROMORPHONE HCL 2 MG/ML IJ SOLN
2.0000 mg | Freq: Once | INTRAMUSCULAR | Status: AC
  Administered 2024-02-13: 2 mg via INTRAVENOUS
  Filled 2024-02-13: qty 1

## 2024-02-13 MED ORDER — SODIUM CHLORIDE 0.9% FLUSH: 9.0000 mL | INTRAVENOUS | Status: DC | PRN

## 2024-02-13 MED ORDER — NALOXONE HCL 0.4 MG/ML IJ SOLN: 0.4000 mg | INTRAMUSCULAR | Status: DC | PRN

## 2024-02-13 MED ORDER — HYDROMORPHONE HCL 1 MG/ML IJ SOLN
2.0000 mg | INTRAMUSCULAR | Status: DC | PRN
  Administered 2024-02-13 – 2024-02-14 (×2): 2 mg via INTRAVENOUS
  Filled 2024-02-13 (×2): qty 2

## 2024-02-13 MED ORDER — HYDROMORPHONE 1 MG/ML IV SOLN
INTRAVENOUS | Status: DC
  Administered 2024-02-13: 5 mg via INTRAVENOUS
  Administered 2024-02-13: 3 mg via INTRAVENOUS
  Administered 2024-02-13: 30 mg via INTRAVENOUS
  Administered 2024-02-14: 4 mg via INTRAVENOUS
  Administered 2024-02-14: 1 mg via INTRAVENOUS
  Administered 2024-02-14: 0.001 mg via INTRAVENOUS
  Administered 2024-02-14: 5.5 mg via INTRAVENOUS
  Filled 2024-02-13: qty 30

## 2024-02-13 MED ORDER — SENNA 8.6 MG PO TABS
2.0000 | ORAL_TABLET | Freq: Two times a day (BID) | ORAL | Status: DC
  Administered 2024-02-13 – 2024-02-16 (×6): 17.2 mg via ORAL
  Filled 2024-02-13 (×6): qty 2

## 2024-02-13 NOTE — Progress Notes (Signed)
 Daily Progress Note   Patient Name: Valerie Kerr       Date: 02/13/2024 DOB: 1960/11/28  Age: 63 y.o. MRN#: [id number] Attending Physician: Jonel Lonni SQUIBB, * Primary Care Physician: Delbert Clam, MD Admit Date: 02/08/2024 Length of Stay: 5 days  Reason for Consultation/Follow-up: Pain control  Subjective:   CC: Patient feels pain in right leg is worse currently.  Following up regarding pain management.  Subjective:  Reviewed EMR including recent documentation from hospitalist.  At time of EMR review in past 24 hours patient has received as needed IV Dilaudid  2 mg x 7 doses.  Discussed care with HoP liaison and reviewed PDMP to determine patient's home regimen prior to presenting to the hospital which was: Dexamethasone  4 mg tab twice daily, alprazolam  0.25 mg every 4 hours as needed, Lidoderm  5% patch once daily, methadone  10 mg tablets with 7 tablets every morning and 4 tablets every afternoon and night, and IV Dilaudid  PCA 20 mg/h basal dose with 10 mg every 15 minute as needed bolus dosing. Discussed with hospitalist for medical updates.   Patient's alprazolam  has already been increased as per patient's request. Discussed care with bedside RN for medical updates.  Presented to bedside to see patient in the afternoon.  RN noted that patient had stated as needed IV Dilaudid  2 mg dose was not working well enough anymore.  Presented to bedside to see patient.  No visitors present at bedside.  Introduced myself as a member of the palliative medicine team.  Patient requested that sheet be removed from her right leg so acknowledged and provided this.  Discussed patient's pain medication management at this time.  Patient feels the IV Dilaudid  does work though not for a long enough time.  Inquired about medications that had helped previously.  Patient did not feel the Lyrica  helped at all with her pain management.  Inquired about PCA which she was receiving and she noted she felt this  did help with pain management.  Noted could appropriately start at this time and increase based on patient's symptom burden.  Noted would also ask RN to provide one-time dose of IV Dilaudid  in addition to help with acute pain management.  Patient agreement with this plan.  Discussed care with hospitalist and bedside RN to coordinate care.  Review of Systems Right leg/knee pain Objective:   Vital Signs:  BP 124/70 (BP Location: Left Arm)   Pulse 90   Temp 98.5 F (36.9 C) (Oral)   Resp 16   Ht 5' 1 (1.549 m)   Wt 68 kg   SpO2 100%   BMI 28.34 kg/m   Physical Exam: General: Awake, grimacing, chronically ill-appearing Cardiovascular: RRR Respiratory: no increased work of breathing noted, not in respiratory distress Neuro: Alert, appropriately answering questions though with signs of pain Psych: Anxious  Assessment & Plan:   Assessment: Patient is a 63 year old female with a past medical history of cervical cancer in remission, hypertension, anxiety, herniated cervical disc and lumbar disc moderate protein calorie malnutrition, failure to thrive, chronic pain, and metastatic leiomyosarcoma who was admitted on 02/08/2024 for management of worsening abdominal pain.  Patient had been at home with support through [facility] which was revoked by family for admission to hospital.  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  Urology consulted for recommendations.  Oncology has stated that patient is not a candidate for additional cancer  directed therapies.  Palliative medicine team consulted to assist with pain management.  Recommendations/Plan:  # Symptom management: Patient is receiving these palliative interventions for symptom management with an intent to improve quality of life.   - Pain, in the setting of metastatic leiomyosarcoma   - Start PCA  with IV Dilaudid  bolus dosing of 0.5 mg every 10 minute as needed.  No continuous infusion ordered at this time.  Continue to adjust based on patient's symptom response.   - Change IV Dilaudid  2 mg every hour as needed to breakthrough after PCA   - Ordered EKG to monitor QTc for consideration of restarting methadone  if needed   - Discontinue oral Dilaudid    - Constipation In setting of receiving opioids.  No BM documented in chart recently.   - Start senna 2 tab twice daily   - Start MiraLAX  17 g daily    - Anxiety   - Receiving Xanax  0.5 mg 3 times daily as needed - # Discharge Planning: To Be Determined  Discussed with: Patient, RN, hospitalist  Thank you for allowing the palliative care team to participate in the care [name].  Tinnie Radar, DO Palliative Care Provider PMT # [phone]  If patient remains symptomatic despite maximum doses, please call PMT at [phone] between 0700 and 1900. Outside of these hours, please call attending, as PMT does not have night coverage.  Billing based on MDM: High   Problems Addressed: One or more chronic illnesses with severe exacerbation, progression, or side effects of treatment.  Risks: Parenteral controlled substances

## 2024-02-13 NOTE — Progress Notes (Signed)
 Progress Note   Patient: Valerie Kerr FMW:[id number] DOB: [birth year] DOA: 02/08/2024     5 DOS: the patient was seen and examined on 02/13/2024 at 7:51AM      Brief hospital course: 63 y.o. 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  At that time, chronic malignant hydroureter was already present, oncology also recommended against PCN at that time due to likely pain outweighing benefit.  She was enrolled with [facility] for several months, I believe living with daughter Welby here in [place], bedbound all that time, not able to sit up even to eat, and requiring escalating doses of methadone , oxycodone , and Dilaudid .  Just preceding this admission, she transitioned to residential hospice for high dose PCA and family had been trying to arrange hospice to hospice transition up in [place]  to be closer to family out of state.  Unfortunately this fell through at the last minute due to cost of ambulance transport and so family brought patient to the hospital.  The same day that this fell through, family disenrolled from [facility] and brought patient to the hosiptal to re-explore transportation to [place].  TOC explored options but were unable to secure affordable transportation and family are exploring options to transition to a different local hospice agency  Over the first 4 days in the hospital, the patient's pain was  controlled with occasional doses of IV dilaudid  and she reported comfort.  In the last 24 hours, her pain has increased and required more frequent IV dilaudid . - Start PCA today - Titrate analgesics as needed - Continue home Xanax  - Consult Palliative Care and TOC for assistance to coordinate local hospice    Possible UTI Urine from admission growing E coli.   Urology were consulted, recommended against PCN given chronicity, likely discomfort outweighing unclear benefit. - Continue Rocephin day 6 of 7     Chronic pain syndrome Of note, patient was on high doses of opiates prior to admission (see my note from 11/14).  She was monitored for opiate withdrawal during this hospital stay and had none.  See above re: escalating pain symptoms.  Starting PCA today, will escalate as needed.    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  No fever, no confusion.  More alert today.     Physical Exam: BP (!) 111/58 (BP Location: Left Arm)   Pulse 97   Temp 99.2 F (37.3 C) (Oral)   Resp 11   Ht 5' 1 (1.549 m)   Wt 68 kg   SpO2 100%   BMI 28.34 kg/m   General: Pt is alert, tired, mostly sleeipng but wakes up to voice, answers questions and then goes back to sleep, more pain today, no  acute distress Cardiovascular: Tachycardic, regular, nl S1-S2, SEM.   No LE edema.   Respiratory: Normal respiratory rate and rhythm.  CTAB without rales or wheezes. Abdominal: Abdomen soft, guarding throughout.   Neuro/Psych: Strength symmetric in upper and lower extremities, extremely weak.  Judgment and insight appear slightly impaired but Oriented x3.   Data Reviewed: Deferred by patient     Family Communication: Daughter  taneesha at bedside, niece at bedside    Disposition: Status is: Inpatient 63 yo F with metastatic leiomyosarcoma, now admitted for asssitance with pain management.  There are no effective treatments to slow her cancer, and Urology recommend against PCN.  Will work with Palliative and TOC to arrange local hospice        Author: Lonni SHAUNNA Dalton, MD 02/13/2024 3:44 PM  For on call review [email or web address].

## 2024-02-13 NOTE — Plan of Care (Signed)
  Problem: Education: Goal: Ability to describe self-care measures that may prevent or decrease complications (Diabetes Survival Skills Education) will improve Outcome: Progressing Goal: Individualized Educational Video(s) Outcome: Progressing   Problem: Fluid Volume: Goal: Ability to maintain a balanced intake and output will improve Outcome: Progressing   Problem: Metabolic: Goal: Ability to maintain appropriate glucose levels will improve Outcome: Progressing   Problem: Skin Integrity: Goal: Risk for impaired skin integrity will decrease Outcome: Progressing   Problem: Pain Managment: Goal: General experience of comfort will improve and/or be controlled Outcome: Progressing   Problem: Safety: Goal: Ability to remain free from injury will improve Outcome: Progressing   Problem: Skin Integrity: Goal: Risk for impaired skin integrity will decrease Outcome: Progressing

## 2024-02-13 NOTE — TOC Progression Note (Signed)
 Transition of Care [facility]) - Progression Note    Patient Details  Name: Valerie Kerr MRN: [id number] Date of Birth: 03/12/1961  [facility] CM/SW Contact  Heather DELENA Saltness, LCSW Phone Number: 02/13/2024, 1:42 PM  Clinical Narrative:    CSW met with pt and niece at bedside to discuss discharge planning. CSW spoke with pt's daughter, Valerie Kerr, [phone], on speaker phone in pt's room. Pt's daughter reports pt will stay in [facility] for the time being. CSW inquired about hospice services. Pt's daughter reports wanting to seek residential hospice, but not at [facility] [place]. CSW advised of only other residential hospice location in [place] being [facility] with [facility]. Pt's daughter reports she will review [facility] prior to making final decision. TOC will follow up with pt's daughter tomorrow.   Expected Discharge Plan: Home/Self Care Barriers to Discharge: Continued Medical Work up   Expected Discharge Plan and Services In-house Referral: NA Discharge Planning Services: CM Consult Post Acute Care Choice: NA Living arrangements for the past 2 months: Apartment                 DME Arranged: N/A DME Agency: NA       HH Arranged: NA HH Agency: NA         Social Drivers of Health (SDOH) Interventions SDOH Screenings   Food Insecurity: No Food Insecurity (02/08/2024)  Housing: Low Risk  (02/08/2024)  Transportation Needs: No Transportation Needs (02/08/2024)  Utilities: Not At Risk (02/08/2024)  Depression (PHQ2-9): Low Risk  (04/13/2023)  Social Connections: Socially Isolated (02/08/2024)  Tobacco Use: High Risk (02/08/2024)  Health Literacy: Adequate Health Literacy (04/19/2023)   Received from [facility]    Readmission Risk Interventions    02/09/2024    3:37 PM  Readmission Risk Prevention Plan  Transportation Screening Complete  Medication Review Oceanographer) Complete  SW Recovery  Care/Counseling Consult Complete  Palliative Care Screening Not Applicable  Skilled Nursing Facility Not Applicable    Signed: Heather Saltness, MSW, LCSW Clinical Social Worker Inpatient Care Management 02/13/2024 1:46 PM

## 2024-02-13 NOTE — Plan of Care (Signed)
  Problem: Safety: Goal: Ability to remain free from injury will improve Outcome: Progressing   Problem: Skin Integrity: Goal: Risk for impaired skin integrity will decrease Outcome: Progressing   Problem: Nutrition: Goal: Adequate nutrition will be maintained Outcome: Not Progressing   Problem: Coping: Goal: Level of anxiety will decrease Outcome: Not Progressing   Problem: Pain Managment: Goal: General experience of comfort will improve and/or be controlled Outcome: Not Progressing

## 2024-02-13 NOTE — Progress Notes (Signed)
 Patient's pain has become increasingly uncontrolled overnight, requiring more pain medication than previously administered. Despite interventions, patient remains unable to get comfortable and continues to complain of pain radiating from the right hip down to the right leg. Patient reports that even the weight of the blanket exacerbates discomfort, further limiting ability to rest. Ongoing monitoring and pain management strategies are being implemented.

## 2024-02-14 DIAGNOSIS — N390 Urinary tract infection, site not specified: Secondary | ICD-10-CM | POA: Diagnosis not present

## 2024-02-14 DIAGNOSIS — N1330 Unspecified hydronephrosis: Secondary | ICD-10-CM

## 2024-02-14 LAB — GLUCOSE, CAPILLARY
Glucose-Capillary: 87 mg/dL (ref 70–99)
Glucose-Capillary: 100 mg/dL — ABNORMAL HIGH (ref 70–99)
Glucose-Capillary: 107 mg/dL — ABNORMAL HIGH (ref 70–99)
Glucose-Capillary: 98 mg/dL (ref 70–99)

## 2024-02-14 MED ORDER — BISACODYL 10 MG RE SUPP
10.0000 mg | Freq: Every day | RECTAL | Status: DC | PRN
  Administered 2024-02-14: 10 mg via RECTAL
  Filled 2024-02-14 (×2): qty 1

## 2024-02-14 MED ORDER — ORAL CARE MOUTH RINSE: 15.0000 mL | OROMUCOSAL | Status: DC | PRN

## 2024-02-14 MED ORDER — OXYCODONE HCL ER 15 MG PO T12A
15.0000 mg | EXTENDED_RELEASE_TABLET | Freq: Three times a day (TID) | ORAL | Status: DC
  Administered 2024-02-14 – 2024-02-15 (×2): 15 mg via ORAL
  Filled 2024-02-14 (×3): qty 1

## 2024-02-14 MED ORDER — HYDROMORPHONE HCL 2 MG/ML IJ SOLN
2.0000 mg | INTRAMUSCULAR | Status: DC | PRN
  Administered 2024-02-14 – 2024-02-16 (×9): 2 mg via INTRAVENOUS
  Filled 2024-02-14 (×9): qty 1

## 2024-02-14 MED ORDER — SODIUM CHLORIDE 0.9 % IV SOLN
1.0000 g | Freq: Once | INTRAVENOUS | Status: AC
  Filled 2024-02-14: qty 10

## 2024-02-14 MED ORDER — HYDROMORPHONE HCL 4 MG PO TABS
4.0000 mg | ORAL_TABLET | ORAL | Status: DC | PRN
  Administered 2024-02-14 – 2024-02-15 (×3): 6 mg via ORAL
  Filled 2024-02-14 (×3): qty 2

## 2024-02-14 MED ORDER — DEXAMETHASONE 4 MG PO TABS: 2.0000 mg | ORAL_TABLET | Freq: Every day | ORAL | Status: DC

## 2024-02-14 NOTE — Progress Notes (Signed)
 PROGRESS NOTE    Valerie Kerr  FMW:[id number] DOB: [birth year] DOA: 02/08/2024 PCP: Delbert Clam, MD    Brief Narrative:   63 y.o. 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  Reportedly patient has been on hospice service.  Palliative care team is following this patient.  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  [facility]ially enrolled in [facility] [place] and transition to residential hospice for high-dose PCA.  Family has been trying to transition hospice up in [place]  unfortunately fell through last minute due to the cost of transport.  Family unenrolled from current hospice services and brought her to the hospital to reexplore transportation to [place] .  Currently pain has been controlled with the help of palliative care services. -Per oncology patient is not a candidate for any further advanced treatments     Possible UTI Urine from admission growing E coli.   Urology were consulted, recommended against PCN given chronicity, likely discomfort outweighing unclear benefit. 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   Examination:  General exam: Appears calm and comfortable  Respiratory system: Clear to auscultation. Respiratory effort normal. Cardiovascular system: S1 & S2 heard, RRR. No JVD, murmurs, rubs, gallops or clicks. No pedal edema. Gastrointestinal system: Abdomen is nondistended, soft and nontender. No organomegaly or masses felt. Normal bowel sounds heard. Central nervous system: Alert and oriented. No focal neurological deficits. Extremities: Symmetric 5 x 5 power. Skin: No rashes, lesions or ulcers Psychiatry: Judgement and insight appear normal. Mood & affect appropriate. Foley in place.               Diet Orders (From admission, onward)     Start     Ordered   02/12/24 1258  Diet regular Room service appropriate? Yes with Assist; Fluid consistency: Thin  Diet effective now       Question Answer Comment  Room service appropriate? Yes with Assist   Fluid consistency: Thin      02/12/24 1257            Objective: Vitals:   02/14/24 0443 02/14/24 0448 02/14/24 0755 02/14/24 1219  BP:  121/68    Pulse:  96    Resp: 10 12 12 13   Temp:  99 F (37.2 C)    TempSrc:  Oral    SpO2: 96% 98% 98% 98%  Weight:      Height:        Intake/Output Summary (Last 24 hours) at 02/14/2024 1320 Last data filed at 02/14/2024 0458 Gross per 24 hour  Intake 308.68 ml  Output 1250 ml  Net -941.32 ml   Filed Weights   02/08/24 1612  Weight: 68 kg    Scheduled Meds:  calcium  carbonate  400 mg of elemental calcium  Oral TID WC   Chlorhexidine Gluconate Cloth  6 each Topical Daily   enoxaparin  (LOVENOX ) injection  40 mg Subcutaneous Q24H   feeding supplement  237 mL Oral BID BM   HYDROmorphone    Intravenous Q4H   multivitamin with minerals  1 tablet Oral Daily   pantoprazole   40 mg Oral BID   polyethylene glycol  17 g Oral Daily   senna  2 tablet  Oral BID   Continuous Infusions:  Nutritional status Signs/Symptoms: estimated needs Interventions: MVI, Ensure Enlive (each supplement provides 350kcal and 20 grams of protein), Liberalize Diet Body mass index is 28.34 kg/m.  Data Reviewed:   CBC: Recent Labs  Lab 02/08/24 0323 02/09/24 0715 02/10/24 0658 02/11/24 0635  WBC 22.4* 13.9* 12.2* 9.0  HGB 10.8* 10.0* 9.8* 9.6*  HCT 35.1* 33.1* 32.5* 32.2*  MCV 88.9 90.9 90.5 91.5  PLT 388 364 402* 405*   Basic Metabolic Panel: Recent Labs  Lab 02/08/24 0323 02/09/24 0715 02/10/24 0658 02/11/24 0635  NA 137 140 143 145  K 3.5 3.7 3.7 3.4*  CL 98 105 105 107  CO2 25 26 27 29   GLUCOSE 91 82 87 152*  BUN 22 20 17 17   CREATININE 1.03* 0.88 0.55 0.61  CALCIUM  9.7 9.3 9.5 9.5   GFR: Estimated Creatinine Clearance: 61.9 mL/min (by C-G formula based on SCr of 0.61 mg/dL). Liver Function Tests: Recent Labs  Lab 02/08/24 0323 02/09/24 0715  AST 22 12*  ALT 10 7  ALKPHOS 236* 115  BILITOT 1.1 0.6  PROT 6.8 6.3*  ALBUMIN 2.5* 2.3*   No results for input(s): LIPASE, AMYLASE in the last 168 hours. No results for input(s): AMMONIA in the last 168 hours. Coagulation Profile: No results for input(s): INR, PROTIME in the last 168 hours. Cardiac Enzymes: No results for input(s): CKTOTAL, CKMB, CKMBINDEX, TROPONINI in the last 168 hours. BNP (last 3 results) No results for input(s): PROBNP in the last 8760 hours. HbA1C: No results for input(s): HGBA1C in the last 72 hours. CBG: Recent Labs  Lab 02/13/24 1250 02/13/24 1654 02/13/24 2054 02/14/24 0754 02/14/24 1211  GLUCAP 81 97 81 87 100*   Lipid Profile: No results for input(s): CHOL, HDL, LDLCALC, TRIG, CHOLHDL, LDLDIRECT in the last 72 hours. Thyroid Function Tests: No results for input(s): TSH, T4TOTAL, FREET4, T3FREE, THYROIDAB in the last 72 hours. Anemia Panel: No results for input(s): VITAMINB12, FOLATE,  FERRITIN, TIBC, IRON, RETICCTPCT in the last 72 hours. Sepsis Labs: No results for input(s): PROCALCITON, LATICACIDVEN in the last 168 hours.  Recent Results (from the past 240 hours)  Urine Culture     Status: Abnormal   Collection Time: 02/08/24  4:11 AM   Specimen: Urine, Random  Result Value Ref Range Status   Specimen Description   Final    URINE, RANDOM Performed at [facility], [address] [place], [place] [address]    Special Requests   Final    NONE Reflexed from [id number] Performed at [facility], [address], [place], [place] [address]    Culture >=100,000 COLONIES/mL ESCHERICHIA COLI (A)  Final   Report Status 02/10/2024 FINAL  Final   Organism ID, Bacteria ESCHERICHIA COLI (A)  Final      Susceptibility   Escherichia coli - MIC*    AMPICILLIN  4 SENSITIVE Sensitive     CEFAZOLIN  (URINE) Value in next row Sensitive      2 SENSITIVEThis is a modified FDA-approved test that has been validated and its performance characteristics determined by the reporting laboratory.  This laboratory is certified under the Clinical Laboratory Improvement Amendments CLIA as qualified to perform high complexity clinical laboratory testing.    CEFEPIME Value in next row Sensitive      2 SENSITIVEThis is a modified FDA-approved test that has been validated and its performance characteristics determined by the reporting laboratory.  This laboratory is certified under the Clinical Laboratory Improvement Amendments CLIA as qualified to perform high complexity clinical laboratory testing.    ERTAPENEM Value in next row Sensitive      2 SENSITIVEThis is a modified FDA-approved test that has been validated and its performance characteristics determined by the reporting laboratory.  This laboratory is certified under the Clinical Laboratory Improvement Amendments CLIA as qualified to perform high complexity clinical laboratory testing.    CEFTRIAXONE  Value in next row Sensitive      2 SENSITIVEThis is a modified FDA-approved test that has been validated and its performance characteristics determined by the reporting laboratory.  This laboratory is certified under the Clinical Laboratory Improvement Amendments CLIA as qualified to perform high complexity clinical laboratory testing.    CIPROFLOXACIN  Value in next row Sensitive      2 SENSITIVEThis is a modified FDA-approved test that has been validated and its performance characteristics determined by the reporting laboratory.  This laboratory is certified under the Clinical Laboratory Improvement Amendments CLIA as qualified to perform high complexity clinical laboratory testing.    GENTAMICIN Value in next row Sensitive      2 SENSITIVEThis is a modified FDA-approved test that has been validated and its performance characteristics determined by the reporting laboratory.  This laboratory is certified under the Clinical Laboratory Improvement Amendments CLIA as qualified to perform high complexity clinical laboratory testing.    NITROFURANTOIN  Value in next row Sensitive      2 SENSITIVEThis is a modified FDA-approved test that has been validated and its performance characteristics determined by the reporting laboratory.  This laboratory is certified under the Clinical Laboratory Improvement Amendments CLIA as qualified to perform high complexity clinical laboratory testing.    TRIMETH /SULFA  Value in next row Sensitive      2 SENSITIVEThis is a modified FDA-approved test that has been validated and its performance characteristics determined by the reporting laboratory.  This laboratory is certified under the Clinical Laboratory Improvement Amendments CLIA as qualified to perform high complexity clinical laboratory testing.    AMPICILLIN/SULBACTAM Value in next row Sensitive      2 SENSITIVEThis is a modified FDA-approved test that has been validated and its performance characteristics determined by the  reporting laboratory.  This laboratory is certified under the Clinical Laboratory Improvement Amendments CLIA as qualified to perform high complexity clinical laboratory testing.    PIP/TAZO Value in next row Sensitive      <=4 SENSITIVEThis is a modified FDA-approved test that has been validated and its performance characteristics determined by the reporting laboratory.  This laboratory is certified under the Clinical Laboratory Improvement Amendments CLIA as qualified to perform high complexity clinical laboratory testing.    MEROPENEM Value in next row Sensitive      <=4 SENSITIVEThis is a modified FDA-approved test that has been validated and its performance characteristics determined by the reporting laboratory.  This laboratory is certified under the Clinical Laboratory  Improvement Amendments CLIA as qualified to perform high complexity clinical laboratory testing.    * >=100,000 COLONIES/mL ESCHERICHIA COLI         Radiology Studies: No results found.         LOS: 6 days   Time spent= 35 mins    Burgess JAYSON Dare, MD [facility]  If 7PM-7AM, please contact night-coverage  02/14/2024, 1:20 PM

## 2024-02-14 NOTE — Progress Notes (Addendum)
 Daily Progress Note   Patient Name: Valerie Kerr       Date: 02/14/2024 DOB: March 06, 1961  Age: 63 y.o. MRN#: [id number] Attending Physician: Caleen Burgess BROCKS, MD Primary Care Physician: Delbert Clam, MD Admit Date: 02/08/2024 Length of Stay: 6 days  Reason for Consultation/Follow-up: Pain control  Subjective:   CC: Patient having abdominal pain.  Following up regarding pain management.  Subjective:  Reviewed EMR including recent documentation from hospitalist and TOC.  Daughter noted to [facility] plan was to review [facility] as an avenue for hospice support.  At time of EMR review in past 24 hours patient has been receiving IV Dilaudid  PCA bolus 0.5 mg dose x 16 boluses.  Patient also received IV Dilaudid  2 mg RN dose x 1.  Patient has also required as needed Xanax  0.5 mg x 2 doses. Discussed care with bedside RN and hospitalist for medical updates.  Presented to bedside to see patient.  No visitors present at bedside.  Patient continues to use PCA pump for pain management.  Patient noting worsening abdominal pain today.  No recent BM documented.  Noted with discussed with RN about providing suppository to assist with management.  Patient asked for she needs to be repositioned on her bed so assist with this.  Patient then asked to be left alone so she could rest.  Acknowledged patient's request and noted palliative medicine team to continue to follow along with patient's medical journey.  Discussed care with hospitalist, TOC, and bedside RN to coordinate care.  UPDATE: Informed later in day that daughter is planning to get patient transported to [place]  on Friday.  Patient will be enrolled through a hospice agency in [place] .  With this in mind, adjusting patient's pain regimen over to oral medications since cannot be transported with PCA and admitted to hospice in [place]  with PCA.  Discussed with hospitalist, TOC, and RN to coordinate.  Objective:   Vital Signs:  BP 121/68 (BP Location:  Left Arm)   Pulse 96   Temp 99 F (37.2 C) (Oral)   Resp 12   Ht 5' 1 (1.549 m)   Wt 68 kg   SpO2 98%   BMI 28.34 kg/m   Physical Exam: General: Awake, chronically ill-appearing Cardiovascular: RRR Respiratory: no increased work of breathing noted, not in respiratory distress Neuro: Awake  Assessment & Plan:   Assessment: Patient is a 63 year old female with a past medical history of cervical cancer in remission, hypertension, anxiety, herniated cervical disc and lumbar disc moderate protein calorie malnutrition, failure to thrive, chronic pain, and metastatic leiomyosarcoma who was admitted on 02/08/2024 for management of worsening abdominal pain.  Patient had been at home with support through [facility] [place] which was revoked by family for admission to hospital.  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  Urology consulted for recommendations.  Oncology has stated that patient is not a candidate for additional cancer directed therapies.  Palliative medicine team consulted to assist with pain management.  Recommendations/Plan:  # Symptom management: Patient is receiving these palliative interventions for symptom management with an intent to improve quality of life.   - Pain, in the setting of metastatic leiomyosarcoma Within the past 24 hours hours, patient has required as needed IV Dilaudid  10 mg for opioid management. Based on OMEs calculated for this dose, which would be approximately 62.5 OME's when reducing by 50% for incomplete cross tolerance, will  appropriately start patient on the listed regimen below.    - Discontinue PCA as working to get patient on oral medications for plan to be transported Friday to [place]    - Start OxyContin  15 mg every 8 hours scheduled    -Choosing OxyContin  at this time for long acting as will begin to work faster  than methadone  so can make adjustments more rapidly if needed   - Start oral Dilaudid  4-6 mg every 4 hours as needed   - Change IV Dilaudid  to 2 mg every 2 hours as needed to breakthrough after oral as needed Dilaudid    - EKG on 02/13/2024 noted QTc 426   - Constipation In setting of receiving opioids.  No BM documented in chart recently.   - Continue senna 2 tab twice daily   - Continue MiraLAX  17 g daily   - Start bisacodyl  suppository daily as needed.  Asked RN to provide today.    - Anxiety   - Continue Xanax  0.5 mg 3 times daily as needed  # Discharge Planning: To Be Determined - Current plan is for patient to be transported to [place]  on Friday afternoon.  Discussed with: Patient, RN, hospitalist, TOC  Thank you for allowing the palliative care team to participate in the care [name].  Tinnie Radar, DO Palliative Care Provider PMT # [phone]  If patient remains symptomatic despite maximum doses, please call PMT at [phone] between 0700 and 1900. Outside of these hours, please call attending, as PMT does not have night coverage.  Billing based on MDM: High  Problems Addressed: One or more chronic illnesses with severe exacerbation, progression, or side effects of treatment.  Risks: Parenteral controlled substances

## 2024-02-14 NOTE — TOC Progression Note (Addendum)
 Transition of Care [facility]) - Progression Note    Patient Details  Name: Valerie Kerr MRN: [id number] Date of Birth: 01-16-1961  [facility] CM/SW Contact  [name], RN Phone Number:[phone]  02/14/2024, 11:58 AM  Clinical Narrative:    Cm received message from palliative team to make aware that family is reconsidering Hospice again and wants [facility]. CM has reached out to daughter [name] [phone] to confirm. CM spoke with Deland who  states that she would like look up a little more information and call CM back to confirm choice.   1513 CM received return call from daughter Deland who confirms that family does not want Hospice services at all. Daughter states that she has hired a Water Quality Scientist to [employer] Per daughter she has already scheduled pickup for Friday Nov. 21 @ 4pm. Daughter states that she has also confirmed that [facility] in [place] can do an intake once the patient arrives. Daughter is requesting scripts to be sent to  hospital pharmacy for discharge. Team has been updated and MD will follow up.    Expected Discharge Plan: Home/Self Care Barriers to Discharge: Continued Medical Work up               Expected Discharge Plan and Services In-house Referral: NA Discharge Planning Services: CM Consult Post Acute Care Choice: NA Living arrangements for the past 2 months: Apartment                 DME Arranged: N/A DME Agency: NA       HH Arranged: NA HH Agency: NA         Social Drivers of Health (SDOH) Interventions SDOH Screenings   Food Insecurity: No Food Insecurity (02/08/2024)  Housing: Low Risk  (02/08/2024)  Transportation Needs: No Transportation Needs (02/08/2024)  Utilities: Not At Risk (02/08/2024)  Depression (PHQ2-9): Low Risk  (04/13/2023)  Social Connections: Socially Isolated (02/08/2024)  Tobacco Use: High Risk (02/08/2024)  Health Literacy: Adequate Health  Literacy (04/19/2023)   Received from [facility]    Readmission Risk Interventions    02/09/2024    3:37 PM  Readmission Risk Prevention Plan  Transportation Screening Complete  Medication Review Oceanographer) Complete  SW Recovery Care/Counseling Consult Complete  Palliative Care Screening Not Applicable  Skilled Nursing Facility Not Applicable

## 2024-02-15 ENCOUNTER — Other Ambulatory Visit (HOSPITAL_COMMUNITY): Payer: Self-pay

## 2024-02-15 DIAGNOSIS — K5903 Drug induced constipation: Secondary | ICD-10-CM

## 2024-02-15 DIAGNOSIS — N390 Urinary tract infection, site not specified: Secondary | ICD-10-CM | POA: Diagnosis not present

## 2024-02-15 LAB — GLUCOSE, CAPILLARY
Glucose-Capillary: 86 mg/dL (ref 70–99)
Glucose-Capillary: 109 mg/dL — ABNORMAL HIGH (ref 70–99)
Glucose-Capillary: 131 mg/dL — ABNORMAL HIGH (ref 70–99)
Glucose-Capillary: 172 mg/dL — ABNORMAL HIGH (ref 70–99)

## 2024-02-15 MED ORDER — METHADONE HCL 10 MG PO TABS
5.0000 mg | ORAL_TABLET | Freq: Two times a day (BID) | ORAL | Status: DC
  Administered 2024-02-15: 5 mg via ORAL
  Filled 2024-02-15: qty 1

## 2024-02-15 MED ORDER — METHADONE HCL 10 MG/ML PO CONC
5.0000 mg | Freq: Two times a day (BID) | ORAL | Status: DC
  Administered 2024-02-15 – 2024-02-16 (×2): 5 mg via ORAL
  Filled 2024-02-15 (×2): qty 5

## 2024-02-15 MED ORDER — HYDROMORPHONE HCL 4 MG PO TABS
8.0000 mg | ORAL_TABLET | ORAL | Status: DC | PRN
Start: 2024-02-15 — End: 2024-02-16
  Administered 2024-02-15 – 2024-02-16 (×3): 8 mg via ORAL
  Filled 2024-02-15 (×3): qty 2

## 2024-02-15 MED ORDER — DEXAMETHASONE 4 MG PO TABS
4.0000 mg | ORAL_TABLET | Freq: Every day | ORAL | Status: DC
  Administered 2024-02-15 – 2024-02-16 (×2): 4 mg via ORAL
  Filled 2024-02-15 (×2): qty 1

## 2024-02-15 NOTE — Progress Notes (Signed)
 PROGRESS NOTE    Erica Zamora  FMW:[id number] DOB: 01-Sep-1960 DOA: 02/08/2024 PCP: Delbert Clam, MD    Brief Narrative:   63 y.o. 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  Reportedly patient has been on hospice service.  Palliative care team is following this patient.  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  [facility] [place] and transition to residential hospice for high-dose PCA.  Family has been trying to transition hospice up in [place]  unfortunately fell through last minute due to the cost of transport.  Family unenrolled from current hospice services and brought her to the hospital to reexplore transportation to Erica Zamora .  Currently pain has been controlled with the help of palliative care services. -Per oncology patient is not a candidate for any further advanced treatments    Possible UTI Urine from admission growing E coli.   Urology were consulted, recommended against PCN given chronicity, likely discomfort outweighing unclear benefit. 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  She is aware we are working on transitioning to oral pain medication anticipation to transfer patient to [facility]  on 11/21 at 4 PM.  This private medical transport has been arranged by the patient's daughter.  I will send  patient's medications to our [facility] pharmacy and have it delivered to bedside tomorrow morning.  DVT prophylaxis: enoxaparin (LOVENOX) injection 40 mg Start: 02/08/24 2200      Code Status: Limited: Do not attempt resuscitation (DNR) -DNR-LIMITED -Do Not Intubate/DNI  Family Communication:   Status is: Inpatient Remains inpatient appropriate because: Ongoing safe disposition anticipate discharge tomorrow at 4 PM.  All the staff is aware to help make arrangements and transition happen smoothly   PT Follow up Recs:   Subjective: Seen at bedside.  Still having significant generalized pain Niece is at bedside   Examination:  General exam: Appears calm and comfortable  Respiratory system: Clear to auscultation. Respiratory effort normal. Cardiovascular system: S1 & S2 heard, RRR. No JVD, murmurs, rubs, gallops or clicks. No pedal edema. Gastrointestinal system: Abdomen is nondistended, soft and nontender. No organomegaly or masses felt. Normal bowel sounds heard. Central nervous system: Alert and oriented. No focal neurological deficits. Extremities: Symmetric 5 x 5 power. Skin: No rashes, lesions or ulcers Psychiatry: Judgement and insight appear normal. Mood & affect appropriate. Foley in place.               Diet Orders (From admission, onward)     Start     Ordered   02/12/24 1258  Diet regular Room service appropriate? Yes with Assist; Fluid consistency: Thin  Diet effective now       Question Answer Comment  Room service appropriate? Yes with Assist   Fluid consistency: Thin      02/12/24 1257            Objective: Vitals:   02/14/24 1219 02/14/24 1403 02/14/24 1800  02/14/24 2128  BP:  (!) 142/76  129/73  Pulse:  99  98  Resp: 13 18 16 18   Temp:  98.3 F (36.8 C)  98.1 F (36.7 C)  TempSrc:  Oral  Oral  SpO2: 98% 97% 100% 100%  Weight:      Height:        Intake/Output Summary (Last 24 hours) at 02/15/2024 1203 Last data filed at 02/14/2024  1828 Gross per 24 hour  Intake 0 ml  Output 350 ml  Net -350 ml   Filed Weights   02/08/24 1612  Weight: 68 kg    Scheduled Meds:  Chlorhexidine Gluconate Cloth  6 each Topical Daily   dexamethasone   4 mg Oral Daily   enoxaparin (LOVENOX) injection  40 mg Subcutaneous Q24H   feeding supplement  237 mL Oral BID BM   methadone  5 mg Oral Q12H   multivitamin with minerals  1 tablet Oral Daily   pantoprazole  40 mg Oral BID   polyethylene glycol  17 g Oral Daily   senna  2 tablet Oral BID   Continuous Infusions:  Nutritional status Signs/Symptoms: estimated needs Interventions: MVI, Ensure Enlive (each supplement provides 350kcal and 20 grams of protein), Liberalize Diet Body mass index is 28.34 kg/m.  Data Reviewed:   CBC: Recent Labs  Lab 02/09/24 0715 02/10/24 0658 02/11/24 0635  WBC 13.9* 12.2* 9.0  HGB 10.0* 9.8* 9.6*  HCT 33.1* 32.5* 32.2*  MCV 90.9 90.5 91.5  PLT 364 402* 405*   Basic Metabolic Panel: Recent Labs  Lab 02/09/24 0715 02/10/24 0658 02/11/24 0635  NA 140 143 145  K 3.7 3.7 3.4*  CL 105 105 107  CO2 26 27 29   GLUCOSE 82 87 152*  BUN 20 17 17   CREATININE 0.88 0.55 0.61  CALCIUM 9.3 9.5 9.5   GFR: Estimated Creatinine Clearance: 61.9 mL/min (by C-G formula based on SCr of 0.61 mg/dL). Liver Function Tests: Recent Labs  Lab 02/09/24 0715  AST 12*  ALT 7  ALKPHOS 115  BILITOT 0.6  PROT 6.3*  ALBUMIN 2.3*   No results for input(s): LIPASE, AMYLASE in the last 168 hours. No results for input(s): AMMONIA in the last 168 hours. Coagulation Profile: No results for input(s): INR, PROTIME in the last 168 hours. Cardiac Enzymes: No results for input(s): CKTOTAL, CKMB, CKMBINDEX, TROPONINI in the last 168 hours. BNP (last 3 results) No results for input(s): PROBNP in the last 8760 hours. HbA1C: No results for input(s): HGBA1C in the last 72 hours. CBG: Recent Labs  Lab 02/14/24 1211 02/14/24 1712  02/14/24 2215 02/15/24 0754 02/15/24 1147  GLUCAP 100* 107* 98 86 109*   Lipid Profile: No results for input(s): CHOL, HDL, LDLCALC, TRIG, CHOLHDL, LDLDIRECT in the last 72 hours. Thyroid Function Tests: No results for input(s): TSH, T4TOTAL, FREET4, T3FREE, THYROIDAB in the last 72 hours. Anemia Panel: No results for input(s): VITAMINB12, FOLATE, FERRITIN, TIBC, IRON, RETICCTPCT in the last 72 hours. Sepsis Labs: No results for input(s): PROCALCITON, LATICACIDVEN in the last 168 hours.  Recent Results (from the past 240 hours)  Urine Culture     Status: Abnormal   Collection Time: 02/08/24  4:11 AM   Specimen: Urine, Random  Result Value Ref Range Status   Specimen Description   Final    URINE, RANDOM Performed at [facility], [address] [place], [place] [address]    Special Requests   Final    NONE Reflexed from [id number]  Performed at [facility], [address], [place], [place] [address]    Culture >=100,000 COLONIES/mL ESCHERICHIA COLI (A)  Final   Report Status 02/10/2024 FINAL  Final   Organism ID, Bacteria ESCHERICHIA COLI (A)  Final      Susceptibility   Escherichia coli - MIC*    AMPICILLIN 4 SENSITIVE Sensitive     CEFAZOLIN (URINE) Value in next row Sensitive      2 SENSITIVEThis is a modified FDA-approved test that has been validated and its performance characteristics determined by the reporting laboratory.  This laboratory is certified under the Clinical Laboratory Improvement Amendments CLIA as qualified to perform high complexity clinical laboratory testing.    CEFEPIME Value in next row Sensitive      2 SENSITIVEThis is a modified FDA-approved test that has been validated and its performance characteristics determined by the reporting laboratory.  This laboratory is certified under the Clinical Laboratory Improvement Amendments CLIA as qualified to perform high complexity clinical  laboratory testing.    ERTAPENEM Value in next row Sensitive      2 SENSITIVEThis is a modified FDA-approved test that has been validated and its performance characteristics determined by the reporting laboratory.  This laboratory is certified under the Clinical Laboratory Improvement Amendments CLIA as qualified to perform high complexity clinical laboratory testing.    CEFTRIAXONE  Value in next row Sensitive      2 SENSITIVEThis is a modified FDA-approved test that has been validated and its performance characteristics determined by the reporting laboratory.  This laboratory is certified under the Clinical Laboratory Improvement Amendments CLIA as qualified to perform high complexity clinical laboratory testing.    CIPROFLOXACIN Value in next row Sensitive      2 SENSITIVEThis is a modified FDA-approved test that has been validated and its performance characteristics determined by the reporting laboratory.  This laboratory is certified under the Clinical Laboratory Improvement Amendments CLIA as qualified to perform high complexity clinical laboratory testing.    GENTAMICIN Value in next row Sensitive      2 SENSITIVEThis is a modified FDA-approved test that has been validated and its performance characteristics determined by the reporting laboratory.  This laboratory is certified under the Clinical Laboratory Improvement Amendments CLIA as qualified to perform high complexity clinical laboratory testing.    NITROFURANTOIN Value in next row Sensitive      2 SENSITIVEThis is a modified FDA-approved test that has been validated and its performance characteristics determined by the reporting laboratory.  This laboratory is certified under the Clinical Laboratory Improvement Amendments CLIA as qualified to perform high complexity clinical laboratory testing.    TRIMETH/SULFA Value in next row Sensitive      2 SENSITIVEThis is a modified FDA-approved test that has been validated and its performance  characteristics determined by the reporting laboratory.  This laboratory is certified under the Clinical Laboratory Improvement Amendments CLIA as qualified to perform high complexity clinical laboratory testing.    AMPICILLIN/SULBACTAM Value in next row Sensitive      2 SENSITIVEThis is a modified FDA-approved test that has been validated and its performance characteristics determined by the reporting laboratory.  This laboratory is certified under the Clinical Laboratory Improvement Amendments CLIA as qualified to perform high complexity clinical laboratory testing.    PIP/TAZO Value in next row Sensitive      <=4 SENSITIVEThis is a modified FDA-approved test that has been validated and its performance characteristics determined by the reporting laboratory.  This laboratory is certified under  the Clinical Laboratory Improvement Amendments CLIA as qualified to perform high complexity clinical laboratory testing.    MEROPENEM Value in next row Sensitive      <=4 SENSITIVEThis is a modified FDA-approved test that has been validated and its performance characteristics determined by the reporting laboratory.  This laboratory is certified under the Clinical Laboratory Improvement Amendments CLIA as qualified to perform high complexity clinical laboratory testing.    * >=100,000 COLONIES/mL ESCHERICHIA COLI         Radiology Studies: No results found.         LOS: 7 days   Time spent= 35 mins    Burgess JAYSON Dare, MD [facility]  If 7PM-7AM, please contact night-coverage  02/15/2024, 12:03 PM

## 2024-02-15 NOTE — Care Plan (Signed)
   Problem: Education: Goal: Ability to describe self-care measures that may prevent or decrease complications (Diabetes Survival Skills Education) will improve Outcome: Progressing Goal: Individualized Educational Video(s) Outcome: Progressing   Problem: Fluid Volume: Goal: Ability to maintain a balanced intake and output will improve Outcome: Progressing   Problem: Metabolic: Goal: Ability to maintain appropriate glucose levels will improve Outcome: Progressing   Problem: Nutritional: Goal: Maintenance of adequate nutrition will improve Outcome: Progressing   Problem: Skin Integrity: Goal: Risk for impaired skin integrity will decrease Outcome: Progressing   Problem: Tissue Perfusion: Goal: Adequacy of tissue perfusion will improve Outcome: Progressing   Problem: Education: Goal: Knowledge of General Education information will improve Description: Including pain rating scale, medication(s)/side effects and non-pharmacologic comfort measures Outcome: Progressing   Problem: Health Behavior/Discharge Planning: Goal: Ability to manage health-related needs will improve Outcome: Progressing   Problem: Clinical Measurements: Goal: Ability to maintain clinical measurements within normal limits will improve Outcome: Progressing Goal: Will remain free from infection Outcome: Progressing Goal: Diagnostic test results will improve Outcome: Progressing Goal: Respiratory complications will improve Outcome: Progressing Goal: Cardiovascular complication will be avoided Outcome: Progressing   Problem: Nutrition: Goal: Adequate nutrition will be maintained Outcome: Progressing   Problem: Coping: Goal: Level of anxiety will decrease Outcome: Progressing   Problem: Elimination: Goal: Will not experience complications related to bowel motility Outcome: Progressing Goal: Will not experience complications related to urinary retention Outcome: Progressing   Problem: Pain  Managment: Goal: General experience of comfort will improve and/or be controlled Outcome: Progressing   Problem: Safety: Goal: Ability to remain free from injury will improve Outcome: Progressing   Problem: Skin Integrity: Goal: Risk for impaired skin integrity will decrease Outcome: Progressing

## 2024-02-15 NOTE — Progress Notes (Signed)
 Daily Progress Note   Patient Name: Baleigh Rennaker       Date: 02/15/2024 DOB: 1960-11-15  Age: 63 y.o. MRN#: [id number] Attending Physician: Caleen Burgess BROCKS, MD Primary Care Physician: Delbert Clam, MD Admit Date: 02/08/2024 Length of Stay: 7 days  Reason for Consultation/Follow-up: Pain control  Subjective:   Reviewed EMR including recent from hospitalist and TOC.  Plan is for patient to be transported to [place]  at 4 PM on 02/16/2024.  At time of EMR reviewed past 24 hours patient has received as needed IV Dilaudid  2 mg x 5 doses and p.o. Dilaudid  6 mg x 3 doses.  Patient had been started on long-acting OxyContin  on 02/14/2024. Last BM noted in EMR to be 02/07/2024.  Patient did receive as needed suppository yesterday.   Discussed care with bedside RN for medical updates.  RN noted patient and family requesting transition of patient back onto methadone.  Noted patient cannot have methadone rapidly adjusted in the hospital which is why OxyContin  was chosen.  Patient still requesting to be placed back on methadone. Patient still having pain so we will transition back onto appropriate dose of methadone based on her OME requirements.  Realize patient has previously received much larger doses though these were discontinued at time of admission and so do not want to start patient on excessive dosing at this time and cause lethargy.  Counseled will appropriately start on methadone 5 mg every 12 hours during the day.  Will increase p.o. Dilaudid  to 8 mg every 4 hours as needed.  Discussed care with RN, hospitalist, TOC, and pharmacist to coordinate care.  Objective:   Vital Signs:  BP 129/73 (BP Location: Left Arm)   Pulse 98   Temp 98.1 F (36.7 C) (Oral)   Resp 18   Ht 5' 1 (1.549 m)   Wt 68 kg   SpO2 100%   BMI 28.34 kg/m   Physical Exam: General: Awake, chronically ill-appearing Cardiovascular: RRR Respiratory: no increased work of breathing noted, not in respiratory  distress Neuro: Awake  Assessment & Plan:   Assessment: Patient is a 63 year old female with a past medical history of cervical cancer in remission, hypertension, anxiety, herniated cervical disc and lumbar disc moderate protein calorie malnutrition, failure to thrive, chronic pain, and metastatic leiomyosarcoma who was admitted on 02/08/2024 for management of worsening abdominal pain.  Patient had been at home with support through [facility] [place] which was revoked by family for admission to hospital.  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  Urology consulted for recommendations.  Oncology has stated that patient is not a candidate for additional cancer directed therapies.  Palliative medicine team consulted to assist with pain management.  Recommendations/Plan:  # Symptom management: Patient is receiving these palliative interventions for symptom management with an intent to improve quality of life.   - Pain, in the setting of metastatic leiomyosarcoma Within the past 24 hours hours, patient has required as needed IV Dilaudid  10 mg and as needed oral Dilaudid  18 mg for breakthrough opioid management. Based on OMEs calculated for this dose, which would be approximately 107.5 OMEs when reducing by 50% for incomplete cross tolerance, will appropriately start patient on the listed regimen below.    - Discontinue OxyContin  as per patient/family request   - Start methadone oral liquid 5 mg every 12 hours scheduled during the day  - Change as needed oral  Dilaudid  to 8 mg every 4 hours as needed   - Continue IV Dilaudid  to 2 mg every 2 hours as needed to breakthrough after oral as needed Dilaudid    - EKG on 02/13/2024 noted QTc 426   - Constipation In setting of receiving opioids.  Last BM documented 02/07/2024   - Continue senna 2 tab twice daily   - Continue  MiraLAX  17 g daily   - Continue bisacodyl suppository daily as needed.  Received 02/14/2024   - Ordered soapsuds enema to be given today.    - Anxiety   - Continue Xanax 0.5 mg 3 times daily as needed  # Discharge Planning: Home with Hospice in [place]  - Current plan is for patient to be transported to [place]  on Friday afternoon.   Thank you for allowing the palliative care team to participate in the care [name].  Tinnie Radar, DO Palliative Care Provider PMT # [phone]  If patient remains symptomatic despite maximum doses, please call PMT at [phone] between 0700 and 1900. Outside of these hours, please call attending, as PMT does not have night coverage.  Billing based on MDM: High  Problems Addressed: One or more chronic illnesses with severe exacerbation, progression, or side effects of treatment.  Risks: Parenteral controlled substances and Drug therapy requiring intensive monitoring for toxicity

## 2024-02-15 NOTE — Progress Notes (Signed)
 PROGRESS NOTE    Valerie Kerr  FMW:[id number] DOB: Dec 31, 1960 DOA: 02/08/2024 PCP: Valerie Clam, MD    Brief Narrative:   63 y.o. 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  Reportedly patient has been on hospice service.  Palliative care team is following this patient.  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  [facility]ially enrolled in [facility] [place] and transition to residential hospice for high-dose PCA.  Family has been trying to transition hospice up in [place]  unfortunately fell through last minute due to the cost of transport.  Family unenrolled from current hospice services and brought her to the hospital to reexplore transportation to [place] .  Currently pain has been controlled with the help of palliative care services. -Per oncology patient is not a candidate for any further advanced treatments    Possible UTI Urine from admission growing E coli.   Urology were consulted, recommended against PCN given chronicity, likely discomfort outweighing unclear benefit. 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  She is aware we are working on transitioning to oral pain medication anticipation to transfer patient to [facility]  on 11/21 at 4 PM.  This private medical transport has been arranged by the patient's daughter.  I will send  patient's medications to our [facility] pharmacy and have it delivered to bedside tomorrow morning.  DVT prophylaxis: enoxaparin  (LOVENOX ) injection 40 mg Start: 02/08/24 2200      Code Status: Limited: Do not attempt resuscitation (DNR) -DNR-LIMITED -Do Not Intubate/DNI  Family Communication:   Status is: Inpatient Remains inpatient appropriate because: Ongoing safe disposition anticipate discharge tomorrow at 4 PM.  All the staff is aware to help make arrangements and transition happen smoothly   PT Follow up Recs:   Subjective: Seen at bedside.  Still having significant generalized pain Niece is at bedside   Examination:  General exam: Appears calm and comfortable  Respiratory system: Clear to auscultation. Respiratory effort normal. Cardiovascular system: S1 & S2 heard, RRR. No JVD, murmurs, rubs, gallops or clicks. No pedal edema. Gastrointestinal system: Abdomen is nondistended, soft and nontender. No organomegaly or masses felt. Normal bowel sounds heard. Central nervous system: Alert and oriented. No focal neurological deficits. Extremities: Symmetric 5 x 5 power. Skin: No rashes, lesions or ulcers Psychiatry: Judgement and insight appear normal. Mood & affect appropriate. Foley in place.               Diet Orders (From admission, onward)     Start     Ordered   02/12/24 1258  Diet regular Room service appropriate? Yes with Assist; Fluid consistency: Thin  Diet effective now       Question Answer Comment  Room service appropriate? Yes with Assist   Fluid consistency: Thin      02/12/24 1257            Objective: Vitals:   02/14/24 1219 02/14/24 1403 02/14/24 1800  02/14/24 2128  BP:  (!) 142/76  129/73  Pulse:  99  98  Resp: 13 18 16 18   Temp:  98.3 F (36.8 C)  98.1 F (36.7 C)  TempSrc:  Oral  Oral  SpO2: 98% 97% 100% 100%  Weight:      Height:        Intake/Output Summary (Last 24 hours) at 02/15/2024 1203 Last data filed at 02/14/2024  1828 Gross per 24 hour  Intake 0 ml  Output 350 ml  Net -350 ml   Filed Weights   02/08/24 1612  Weight: 68 kg    Scheduled Meds:  Chlorhexidine Gluconate Cloth  6 each Topical Daily   dexamethasone   4 mg Oral Daily   enoxaparin  (LOVENOX ) injection  40 mg Subcutaneous Q24H   feeding supplement  237 mL Oral BID BM   methadone   5 mg Oral Q12H   multivitamin with minerals  1 tablet Oral Daily   pantoprazole   40 mg Oral BID   polyethylene glycol  17 g Oral Daily   senna  2 tablet Oral BID   Continuous Infusions:  Nutritional status Signs/Symptoms: estimated needs Interventions: MVI, Ensure Enlive (each supplement provides 350kcal and 20 grams of protein), Liberalize Diet Body mass index is 28.34 kg/m.  Data Reviewed:   CBC: Recent Labs  Lab 02/09/24 0715 02/10/24 0658 02/11/24 0635  WBC 13.9* 12.2* 9.0  HGB 10.0* 9.8* 9.6*  HCT 33.1* 32.5* 32.2*  MCV 90.9 90.5 91.5  PLT 364 402* 405*   Basic Metabolic Panel: Recent Labs  Lab 02/09/24 0715 02/10/24 0658 02/11/24 0635  NA 140 143 145  K 3.7 3.7 3.4*  CL 105 105 107  CO2 26 27 29   GLUCOSE 82 87 152*  BUN 20 17 17   CREATININE 0.88 0.55 0.61  CALCIUM  9.3 9.5 9.5   GFR: Estimated Creatinine Clearance: 61.9 mL/min (by C-G formula based on SCr of 0.61 mg/dL). Liver Function Tests: Recent Labs  Lab 02/09/24 0715  AST 12*  ALT 7  ALKPHOS 115  BILITOT 0.6  PROT 6.3*  ALBUMIN 2.3*   No results for input(s): LIPASE, AMYLASE in the last 168 hours. No results for input(s): AMMONIA in the last 168 hours. Coagulation Profile: No results for input(s): INR, PROTIME in the last 168 hours. Cardiac Enzymes: No results for input(s): CKTOTAL, CKMB, CKMBINDEX, TROPONINI in the last 168 hours. BNP (last 3 results) No results for input(s): PROBNP in the last 8760 hours. HbA1C: No results for input(s): HGBA1C in the last 72 hours. CBG: Recent Labs  Lab 02/14/24 1211 02/14/24 1712  02/14/24 2215 02/15/24 0754 02/15/24 1147  GLUCAP 100* 107* 98 86 109*   Lipid Profile: No results for input(s): CHOL, HDL, LDLCALC, TRIG, CHOLHDL, LDLDIRECT in the last 72 hours. Thyroid Function Tests: No results for input(s): TSH, T4TOTAL, FREET4, T3FREE, THYROIDAB in the last 72 hours. Anemia Panel: No results for input(s): VITAMINB12, FOLATE, FERRITIN, TIBC, IRON, RETICCTPCT in the last 72 hours. Sepsis Labs: No results for input(s): PROCALCITON, LATICACIDVEN in the last 168 hours.  Recent Results (from the past 240 hours)  Urine Culture     Status: Abnormal   Collection Time: 02/08/24  4:11 AM   Specimen: Urine, Random  Result Value Ref Range Status   Specimen Description   Final    URINE, RANDOM Performed at [facility], [address] [place], [place] [address]    Special Requests   Final    NONE Reflexed from [id number]  Performed at [facility], [address], [place], [place] [address]    Culture >=100,000 COLONIES/mL ESCHERICHIA COLI (A)  Final   Report Status 02/10/2024 FINAL  Final   Organism ID, Bacteria ESCHERICHIA COLI (A)  Final      Susceptibility   Escherichia coli - MIC*    AMPICILLIN 4 SENSITIVE Sensitive     CEFAZOLIN  (URINE) Value in next row Sensitive      2 SENSITIVEThis is a modified FDA-approved test that has been validated and its performance characteristics determined by the reporting laboratory.  This laboratory is certified under the Clinical Laboratory Improvement Amendments CLIA as qualified to perform high complexity clinical laboratory testing.    CEFEPIME Value in next row Sensitive      2 SENSITIVEThis is a modified FDA-approved test that has been validated and its performance characteristics determined by the reporting laboratory.  This laboratory is certified under the Clinical Laboratory Improvement Amendments CLIA as qualified to perform high complexity clinical  laboratory testing.    ERTAPENEM Value in next row Sensitive      2 SENSITIVEThis is a modified FDA-approved test that has been validated and its performance characteristics determined by the reporting laboratory.  This laboratory is certified under the Clinical Laboratory Improvement Amendments CLIA as qualified to perform high complexity clinical laboratory testing.    CEFTRIAXONE Value in next row Sensitive      2 SENSITIVEThis is a modified FDA-approved test that has been validated and its performance characteristics determined by the reporting laboratory.  This laboratory is certified under the Clinical Laboratory Improvement Amendments CLIA as qualified to perform high complexity clinical laboratory testing.    CIPROFLOXACIN  Value in next row Sensitive      2 SENSITIVEThis is a modified FDA-approved test that has been validated and its performance characteristics determined by the reporting laboratory.  This laboratory is certified under the Clinical Laboratory Improvement Amendments CLIA as qualified to perform high complexity clinical laboratory testing.    GENTAMICIN Value in next row Sensitive      2 SENSITIVEThis is a modified FDA-approved test that has been validated and its performance characteristics determined by the reporting laboratory.  This laboratory is certified under the Clinical Laboratory Improvement Amendments CLIA as qualified to perform high complexity clinical laboratory testing.    NITROFURANTOIN  Value in next row Sensitive      2 SENSITIVEThis is a modified FDA-approved test that has been validated and its performance characteristics determined by the reporting laboratory.  This laboratory is certified under the Clinical Laboratory Improvement Amendments CLIA as qualified to perform high complexity clinical laboratory testing.    TRIMETH /SULFA  Value in next row Sensitive      2 SENSITIVEThis is a modified FDA-approved test that has been validated and its performance  characteristics determined by the reporting laboratory.  This laboratory is certified under the Clinical Laboratory Improvement Amendments CLIA as qualified to perform high complexity clinical laboratory testing.    AMPICILLIN/SULBACTAM Value in next row Sensitive      2 SENSITIVEThis is a modified FDA-approved test that has been validated and its performance characteristics determined by the reporting laboratory.  This laboratory is certified under the Clinical Laboratory Improvement Amendments CLIA as qualified to perform high complexity clinical laboratory testing.    PIP/TAZO Value in next row Sensitive      <=4 SENSITIVEThis is a modified FDA-approved test that has been validated and its performance characteristics determined by the reporting laboratory.  This laboratory is certified under  the Clinical Laboratory Improvement Amendments CLIA as qualified to perform high complexity clinical laboratory testing.    MEROPENEM Value in next row Sensitive      <=4 SENSITIVEThis is a modified FDA-approved test that has been validated and its performance characteristics determined by the reporting laboratory.  This laboratory is certified under the Clinical Laboratory Improvement Amendments CLIA as qualified to perform high complexity clinical laboratory testing.    * >=100,000 COLONIES/mL ESCHERICHIA COLI         Radiology Studies: No results found.         LOS: 7 days   Time spent= 35 mins    Burgess JAYSON Dare, MD [facility]  If 7PM-7AM, please contact night-coverage  02/15/2024, 12:03 PM

## 2024-02-15 NOTE — Progress Notes (Signed)
 Daily Progress Note   Patient Name: Valerie Kerr       Date: 02/15/2024 DOB: [birth year]  Age: 63 y.o. MRN#: [id number] Attending Physician: Caleen Burgess BROCKS, MD Primary Care Physician: Delbert Clam, MD Admit Date: 02/08/2024 Length of Stay: 7 days  Reason for Consultation/Follow-up: Pain control  Subjective:   Reviewed EMR including recent from hospitalist and TOC.  Plan is for patient to be transported to [place]  at 4 PM on 02/16/2024.  At time of EMR reviewed past 24 hours patient has received as needed IV Dilaudid  2 mg x 5 doses and p.o. Dilaudid  6 mg x 3 doses.  Patient had been started on long-acting OxyContin  on 02/14/2024. Last BM noted in EMR to be 02/07/2024.  Patient did receive as needed suppository yesterday.   Discussed care with bedside RN for medical updates.  RN noted patient and family requesting transition of patient back onto methadone .  Noted patient cannot have methadone  rapidly adjusted in the hospital which is why OxyContin  was chosen.  Patient still requesting to be placed back on methadone . Patient still having pain so we will transition back onto appropriate dose of methadone  based on her OME requirements.  Realize patient has previously received much larger doses though these were discontinued at time of admission and so do not want to start patient on excessive dosing at this time and cause lethargy.  Counseled will appropriately start on methadone  5 mg every 12 hours during the day.  Will increase p.o. Dilaudid  to 8 mg every 4 hours as needed.  Discussed care with RN, hospitalist, TOC, and pharmacist to coordinate care.  Objective:   Vital Signs:  BP 129/73 (BP Location: Left Arm)   Pulse 98   Temp 98.1 F (36.7 C) (Oral)   Resp 18   Ht 5' 1 (1.549 m)   Wt 68 kg   SpO2 100%   BMI 28.34 kg/m   Physical Exam: General: Awake, chronically ill-appearing Cardiovascular: RRR Respiratory: no increased work of breathing noted, not in respiratory  distress Neuro: Awake  Assessment & Plan:   Assessment: Patient is a 63 year old female with a past medical history of cervical cancer in remission, hypertension, anxiety, herniated cervical disc and lumbar disc moderate protein calorie malnutrition, failure to thrive, chronic pain, and metastatic leiomyosarcoma who was admitted on 02/08/2024 for management of worsening abdominal pain.  Patient had been at home with support through [facility] [place] which was revoked by family for admission to hospital.  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  Urology consulted for recommendations.  Oncology has stated that patient is not a candidate for additional cancer directed therapies.  Palliative medicine team consulted to assist with pain management.  Recommendations/Plan:  # Symptom management: Patient is receiving these palliative interventions for symptom management with an intent to improve quality of life.   - Pain, in the setting of metastatic leiomyosarcoma Within the past 24 hours hours, patient has required as needed IV Dilaudid  10 mg and as needed oral Dilaudid  18 mg for breakthrough opioid management. Based on OMEs calculated for this dose, which would be approximately 107.5 OMEs when reducing by 50% for incomplete cross tolerance, will appropriately start patient on the listed regimen below.    - Discontinue OxyContin  as per patient/family request   - Start methadone  oral liquid 5 mg every 12 hours scheduled during the day  - Change as needed oral  Dilaudid  to 8 mg every 4 hours as needed   - Continue IV Dilaudid  to 2 mg every 2 hours as needed to breakthrough after oral as needed Dilaudid    - EKG on 02/13/2024 noted QTc 426   - Constipation In setting of receiving opioids.  Last BM documented 02/07/2024   - Continue senna 2 tab twice daily   - Continue  MiraLAX  17 g daily   - Continue bisacodyl  suppository daily as needed.  Received 02/14/2024   - Ordered soapsuds enema to be given today.    - Anxiety   - Continue Xanax  0.5 mg 3 times daily as needed  # Discharge Planning: Home with Hospice in [place]  - Current plan is for patient to be transported to [place]  on Friday afternoon.   Thank you for allowing the palliative care team to participate in the care [name].  Tinnie Radar, DO Palliative Care Provider PMT # [phone]  If patient remains symptomatic despite maximum doses, please call PMT at [phone] between 0700 and 1900. Outside of these hours, please call attending, as PMT does not have night coverage.  Billing based on MDM: High  Problems Addressed: One or more chronic illnesses with severe exacerbation, progression, or side effects of treatment.  Risks: Parenteral controlled substances and Drug therapy requiring intensive monitoring for toxicity

## 2024-02-15 NOTE — Plan of Care (Signed)
  Problem: Education: Goal: Ability to describe self-care measures that may prevent or decrease complications (Diabetes Survival Skills Education) will improve Outcome: Progressing Goal: Individualized Educational Video(s) Outcome: Progressing   Problem: Fluid Volume: Goal: Ability to maintain a balanced intake and output will improve Outcome: Progressing   Problem: Metabolic: Goal: Ability to maintain appropriate glucose levels will improve Outcome: Progressing   Problem: Nutritional: Goal: Maintenance of adequate nutrition will improve Outcome: Progressing   Problem: Skin Integrity: Goal: Risk for impaired skin integrity will decrease Outcome: Progressing   Problem: Tissue Perfusion: Goal: Adequacy of tissue perfusion will improve Outcome: Progressing   Problem: Education: Goal: Knowledge of General Education information will improve Description: Including pain rating scale, medication(s)/side effects and non-pharmacologic comfort measures Outcome: Progressing   Problem: Health Behavior/Discharge Planning: Goal: Ability to manage health-related needs will improve Outcome: Progressing   Problem: Clinical Measurements: Goal: Ability to maintain clinical measurements within normal limits will improve Outcome: Progressing Goal: Will remain free from infection Outcome: Progressing Goal: Diagnostic test results will improve Outcome: Progressing Goal: Respiratory complications will improve Outcome: Progressing Goal: Cardiovascular complication will be avoided Outcome: Progressing   Problem: Nutrition: Goal: Adequate nutrition will be maintained Outcome: Progressing   Problem: Coping: Goal: Level of anxiety will decrease Outcome: Progressing   Problem: Elimination: Goal: Will not experience complications related to bowel motility Outcome: Progressing Goal: Will not experience complications related to urinary retention Outcome: Progressing   Problem: Pain  Managment: Goal: General experience of comfort will improve and/or be controlled Outcome: Progressing   Problem: Safety: Goal: Ability to remain free from injury will improve Outcome: Progressing   Problem: Skin Integrity: Goal: Risk for impaired skin integrity will decrease Outcome: Progressing

## 2024-02-16 ENCOUNTER — Other Ambulatory Visit (HOSPITAL_COMMUNITY): Payer: Self-pay

## 2024-02-16 DIAGNOSIS — R112 Nausea with vomiting, unspecified: Secondary | ICD-10-CM

## 2024-02-16 DIAGNOSIS — K5900 Constipation, unspecified: Secondary | ICD-10-CM

## 2024-02-16 DIAGNOSIS — N390 Urinary tract infection, site not specified: Secondary | ICD-10-CM | POA: Diagnosis not present

## 2024-02-16 LAB — GLUCOSE, CAPILLARY
Glucose-Capillary: 125 mg/dL — ABNORMAL HIGH (ref 70–99)
Glucose-Capillary: 136 mg/dL — ABNORMAL HIGH (ref 70–99)
Glucose-Capillary: 180 mg/dL — ABNORMAL HIGH (ref 70–99)

## 2024-02-16 MED ORDER — POLYETHYLENE GLYCOL 3350 17 GM/SCOOP PO POWD
17.0000 g | Freq: Two times a day (BID) | ORAL | 0 refills | Status: AC
  Filled 2024-02-16: qty 238, 7d supply, fill #0

## 2024-02-16 MED ORDER — ALPRAZOLAM 0.5 MG PO TABS
0.5000 mg | ORAL_TABLET | Freq: Three times a day (TID) | ORAL | 0 refills | Status: AC | PRN
  Filled 2024-02-16: qty 30, 10d supply, fill #0

## 2024-02-16 MED ORDER — SENNA 8.6 MG PO TABS
2.0000 | ORAL_TABLET | Freq: Two times a day (BID) | ORAL | 0 refills | Status: AC
  Filled 2024-02-16: qty 30, 8d supply, fill #0

## 2024-02-16 MED ORDER — METHADONE HCL 10 MG/ML PO CONC
5.0000 mg | Freq: Two times a day (BID) | ORAL | 0 refills | Status: DC
  Filled 2024-02-16: qty 30, 30d supply, fill #0

## 2024-02-16 MED ORDER — BISACODYL 5 MG PO TBEC
10.0000 mg | DELAYED_RELEASE_TABLET | Freq: Once | ORAL | Status: AC
  Administered 2024-02-16: 10 mg via ORAL
  Filled 2024-02-16: qty 2

## 2024-02-16 MED ORDER — ENSURE PLUS HIGH PROTEIN PO LIQD: 237.0000 mL | Freq: Two times a day (BID) | ORAL | Status: AC

## 2024-02-16 MED ORDER — HYDROMORPHONE HCL 4 MG PO TABS
8.0000 mg | ORAL_TABLET | ORAL | Status: DC | PRN
  Administered 2024-02-16: 8 mg via ORAL
  Filled 2024-02-16: qty 2

## 2024-02-16 MED ORDER — LACTULOSE 10 GM/15ML PO SOLN
30.0000 g | ORAL | Status: AC
  Administered 2024-02-16 (×2): 30 g via ORAL
  Filled 2024-02-16 (×2): qty 45

## 2024-02-16 MED ORDER — DEXAMETHASONE 4 MG PO TABS
4.0000 mg | ORAL_TABLET | Freq: Every day | ORAL | 0 refills | Status: AC
  Filled 2024-02-16: qty 5, 5d supply, fill #0

## 2024-02-16 MED ORDER — HYDROMORPHONE HCL 8 MG PO TABS
8.0000 mg | ORAL_TABLET | ORAL | 0 refills | Status: AC | PRN
  Filled 2024-02-16: qty 30, 5d supply, fill #0

## 2024-02-16 MED ORDER — METHADONE HCL 5 MG PO TABS
5.0000 mg | ORAL_TABLET | Freq: Two times a day (BID) | ORAL | 0 refills | Status: AC
  Filled 2024-02-16: qty 30, 15d supply, fill #0

## 2024-02-16 MED ORDER — ADULT MULTIVITAMIN W/MINERALS CH: 1.0000 | ORAL_TABLET | Freq: Every day | ORAL | Status: AC

## 2024-02-16 NOTE — Progress Notes (Signed)
 Daily Progress Note   Patient Name: Erica Zamora       Date: 02/16/2024 DOB: 01-Aug-1960  Age: 63 y.o. MRN#: [id number] Attending Physician: Caleen Burgess BROCKS, MD Primary Care Physician: Delbert Clam, MD Admit Date: 02/08/2024 Length of Stay: 8 days  Reason for Consultation/Follow-up: Pain control  Subjective:   Reviewed EMR including recent documentation from hospitalist.  At time of EMR reviewed past 24 hours patient has received as needed p.o. Dilaudid  8 mg x 4 doses and as needed IV Dilaudid  2 mg x 4 doses.  Patient was started on methadone  5 mg every 12 hours during the day on 02/15/2024. Discussed care with hospitalist and RN for medical updates.  Presented to bedside to see patient.  Patient laying comfortably in bed sleeping though easily awakened.  Patient's niece, Erica Zamora, present at bedside.  Discussed importance of patient taking medications to have bowel movement today as she has been refusing medications and interventions to assist with this at times.  Discussed plan for patient to transfer to [place]  today at [facility].  Patient agreement with this plan.  [facility] in   planning to perform intake once she arrives today.  Spent time providing emotional support via active listening for patient and Erica Zamora.  All questions answered at that time.  Note upon medicine team would be available if needed.  Objective:   Vital Signs:  BP 120/72 (BP Location: Left Arm)   Pulse 99   Temp 97.9 F (36.6 C)   Resp 18   Ht 5' 1 (1.549 m)   Wt 68 kg   SpO2 100%   BMI 28.34 kg/m   Physical Exam: General: Awake, chronically ill-appearing Cardiovascular: RRR Respiratory: no increased work of breathing noted, not in respiratory distress Neuro: Awake Psych: Calm  Assessment & Plan:   Assessment: Patient is a 63 year old female with a past medical history of cervical cancer in remission, hypertension, anxiety, herniated cervical disc and lumbar disc moderate protein  calorie malnutrition, failure to thrive, chronic pain, and metastatic leiomyosarcoma who was admitted on 02/08/2024 for management of worsening abdominal pain.  Patient had been at home with support through [facility] [place] which was revoked by family for admission to hospital.  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  Urology consulted for recommendations.  Oncology has stated that patient is not a candidate for additional cancer directed therapies.  Palliative medicine team consulted to assist with pain management.  Recommendations/Plan:  # Symptom management: Patient is receiving these palliative interventions for symptom management with an intent to improve quality of life.   - Pain, in the setting of metastatic leiomyosarcoma   - Continue methadone  oral liquid 5 mg every 12 hours scheduled during the day  - Change as needed oral Dilaudid  to 8 mg every 2hours as needed   - Continue IV Dilaudid  to 2 mg every 2 hours as needed to breakthrough after oral as needed Dilaudid    - EKG on 02/13/2024 noted QTc 426   - Constipation In setting of receiving opioids.  Last BM documented 02/07/2024   - Continue senna 2 tab twice daily   - Continue MiraLAX  17 g daily   - Continue bisacodyl  suppository daily as needed.   - Agree with use of scheduled lactulose  today    - Anxiety   - Continue Xanax  0.5 mg 3 times daily as needed  # Discharge Planning: Home with Hospice  in [place]  today - Current plan is for patient to be transported to   this afternoon.   Thank you for allowing the palliative care team to participate in the care Erica Zamora.  Erica Radar, DO Palliative Care Provider PMT # [phone]  If patient remains symptomatic despite maximum doses, please call PMT at [phone] between 0700 and 1900. Outside of these hours, please call  attending, as PMT does not have night coverage.

## 2024-02-16 NOTE — Progress Notes (Signed)
 Transition of Care [facility]) - Discharge Note   Patient Details  Name: Erica Zamora MRN: [id number] Date of Birth: Sep 27, 1960  [facility]) CM/SW Contact:  [name], RN Phone Number:[phone]  02/16/2024, 1:51 PM   Clinical Narrative:    Patient is  discharging to [facility]. Daughter [name] has arranged private transport. Address has been verified . Standard discharrge packet has been prepared. DNR form present and signed. Currently there are no further inpatient care management needs.  Inpatient care management will sign off.    Final next level of care: Other (comment) (To [facility] per family request Hospice revoked but daughter has set up new Hospice agency with intake to take place when patient arrives to [facility]) Barriers to Discharge: No Barriers Identified   Patient Goals and CMS Choice Patient states their goals for this hospitalization and ongoing recovery are:: Wants to return home with daughter   Choice offered to / list presented to : NA [facility] ownership interest in [facility].provided to::  (n/a)    Discharge Placement                       Discharge Plan and Services Additional resources added to the After Visit Summary for   In-house Referral: NA Discharge Planning Services: CM Consult Post Acute Care Choice: NA          DME Arranged: N/A DME Agency: NA       HH Arranged: NA HH Agency: NA        Social Drivers of Health (SDOH) Interventions SDOH Screenings   Food Insecurity: No Food Insecurity (02/08/2024)  Housing: Low Risk  (02/08/2024)  Transportation Needs: No Transportation Needs (02/08/2024)  Utilities: Not At Risk (02/08/2024)  Depression (PHQ2-9): Low Risk  (04/13/2023)  Social Connections: Socially Isolated (02/08/2024)  Tobacco Use: High Risk (02/08/2024)  Health Literacy: Adequate Health Literacy (04/19/2023)   Received from [facility]     Readmission Risk  Interventions    02/09/2024    3:37 PM  Readmission Risk Prevention Plan  Transportation Screening Complete  Medication Review Ocean[employer]) Complete  SW Recovery Care/Counseling Consult Complete  Palliative Care Screening Not Applicable  Skilled Nursing Facility Not Applicable

## 2024-02-16 NOTE — Progress Notes (Signed)
 Discharge meds in  a secure bag delivered to patient and family in room by this RN

## 2024-02-16 NOTE — ED Triage Notes (Signed)
"  Pt brought into ED with cc of n/v that started today on her way back from [place]. Pt has hx of bone cancer. Pt received methadone, a pain med but threw it up per her daughter.   "

## 2024-02-16 NOTE — Discharge Summary (Signed)
 Physician Discharge Summary  Erica Zamora FMW:[id number] DOB: April 25, 1960 DOA: 02/08/2024  PCP: Delbert Clam, MD  Admit date: 02/08/2024 Discharge date: 02/16/2024  Admitted From: Home Disposition: Home  Recommendations for Outpatient Follow-up:  Patient being transferred to hospice in [facility]    Discharge Condition: Stable CODE STATUS: DNR Diet recommendation: Regular  Brief Narrative:   63 y.o. 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  Reportedly patient has been on hospice service.  Palliative care team is following this patient. Briefly patient was initiated on PCA pump for better pain control and thereafter transition to p.o. After several days of finding safe disposition, daughter was able to arrange for transportation to [place]  under hospice services.  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  Initially enrolled in [facility] [place] and transition to residential hospice for high-dose PCA.  Family has been trying to transition hospice up in [place]  unfortunately fell through last minute due to the cost of transport.  Family unenrolled from current hospice services and brought her to the hospital to reexplore transportation to [place] .  Currently pain has been controlled with the help of palliative care services. -Per oncology patient is not a candidate for any further advanced treatments    Possible UTI Urine from admission growing E coli.   Urology were consulted, recommended against PCN given chronicity, likely discomfort outweighing unclear benefit. 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  Patient will travel with supplemental oxygen and Foley catheter for comfort as well.  Medications will be delivered to her bedside.  DVT prophylaxis: enoxaparin (LOVENOX) injection 40 mg Start: 02/08/24 2200      Code Status: Limited: Do not attempt resuscitation (DNR) -DNR-LIMITED -Do Not Intubate/DNI  Family Communication:   Status is: Inpatient Remains inpatient appropriate because: Ongoing safe disposition anticipate discharge todayat 4 PM.  All the staff is aware to help make arrangements and transition happen smoothly   PT Follow up Recs:   Subjective: No complaints at the time of my visit. She was resting comfortably.    Examination:  General exam: Appears calm and comfortable  Respiratory system: Clear to auscultation. Respiratory effort normal. Cardiovascular system: S1 & S2 heard, RRR. No JVD, murmurs, rubs, gallops or clicks. No pedal edema. Gastrointestinal system: Abdomen is nondistended, soft and nontender. No organomegaly or masses felt. Normal bowel sounds heard. Central nervous system: Alert and oriented. No focal neurological deficits. Extremities: Symmetric 5 x 5 power. Skin: No rashes, lesions or ulcers Psychiatry: Judgement and insight appear normal. Mood & affect appropriate. Foley in place.   Discharge Diagnoses:  Principal Problem:   Acute UTI (urinary tract infection) Active Problems:   Cancer-related breakthrough pain from Metastatic leiomyosarcoma of the bone /Chronic pain syndrome   Essential hypertension   GERD (gastroesophageal reflux disease)   Anemia due to antineoplastic chemotherapy    Hydroureter on right   Hydronephrosis of right kidney  Moderate protein malnutrition   Urinary tract infection with hematuria   Constipation      Discharge Exam: Vitals:   02/15/24 2156 02/16/24 0533  BP: (!) 116/56 120/72  Pulse: 97 99  Resp: 18 18  Temp: 97.9 F (36.6 C) 97.9 F (36.6 C)  SpO2: 96% 100%   Vitals:   02/14/24 1800 02/14/24 2128 02/15/24 2156 02/16/24 0533  BP:  129/73 (!) 116/56 120/72  Pulse:  98 97 99  Resp: 16 18 18 18   Temp:  98.1 F (36.7 C) 97.9 F (36.6 C) 97.9 F (36.6 C)  TempSrc:  Oral    SpO2: 100% 100% 96% 100%  Weight:      Height:          Discharge Instructions   Allergies as of 02/16/2024       Reactions   Flexeril [cyclobenzaprine] Other (See Comments)   Caused a lot of sedation and did not work   Codeine Itching, Rash   Fentanyl Swelling, Rash, Other (See Comments)   Skin rash, local swelling from patch   Tramadol Nausea Only, Other (See Comments)   GI upset,also trembling sensation        Medication List     STOP taking these medications    Azelastine HCl 137 MCG/SPRAY Soln   Biofreeze 10 % Crea Generic drug: Menthol (Topical Analgesic)   bisacodyl 10 MG suppository Commonly known as: DULCOLAX   fluticasone 50 MCG/ACT nasal spray Commonly known as: FLONASE   lidocaine  5 % Commonly known as: LIDODERM    lisinopril-hydrochlorothiazide 10-12.5 MG tablet Commonly known as: ZESTORETIC   methadone 10 MG tablet Commonly known as: DOLOPHINE Replaced by: methadone 10 MG/ML solution   ondansetron  4 MG tablet Commonly known as: ZOFRAN    OVER THE COUNTER MEDICATION   oxycodone  30 MG immediate release tablet Commonly known as: ROXICODONE    pregabalin 100 MG capsule Commonly known as: LYRICA   senna-docusate 8.6-50 MG tablet Commonly known as: Senokot-S   Stool Softener/Laxative 50-8.6 MG tablet Generic drug: senna-docusate       TAKE these medications    ALPRAZolam 0.5 MG tablet Commonly  known as: XANAX Take 1 tablet (0.5 mg total) by mouth 3 (three) times daily as needed for anxiety. What changed:  medication strength when to take this reasons to take this   dexamethasone  4 MG tablet Commonly known as: DECADRON  Take 1 tablet (4 mg total) by mouth daily for 5 days. Start taking on: February 17, 2024 What changed:  how much to take Another medication with the same name was removed. Continue taking this medication, and follow the directions you see here.   feeding supplement Liqd Take 237 mLs by mouth 2 (two) times daily between meals.   HYDROmorphone  8 MG tablet Commonly known as: DILAUDID  Take 1 tablet (8 mg total) by mouth every 2 (two) hours as needed for severe pain (pain score 7-10) or moderate pain (pain score 4-6). What changed:  how much to take when to take this reasons to take this   ibuprofen 600 MG tablet Commonly known as: ADVIL Take 1 tablet (600 mg total) by mouth every 8 (eight) hours as needed for moderate pain (pain score 4-6) or mild pain (pain score 1-3).   methadone 10 MG/ML solution Commonly known as: DOLOPHINE Take 0.5 mLs (5 mg total) by mouth every 12 (twelve) hours. Replaces: methadone 10 MG tablet   multivitamin with minerals Tabs tablet Take 1 tablet by mouth daily. Start taking on: February 17, 2024   omeprazole 20 MG capsule Commonly known as: PRILOSEC Take 1 capsule (20 mg total) by mouth daily for GERD. What changed: Another medication with the same name was removed. Continue taking this medication, and follow the directions you see here.   polyethylene glycol powder 17 GM/SCOOP powder Commonly known as: GLYCOLAX /MIRALAX  Take 17 g by mouth 2 (two) times daily. Dissolve 1 capful (17g) in 4-8 ounces of liquid and take by mouth daily. What changed:  when to take this additional instructions   senna 8.6 MG Tabs tablet Commonly known as: SENOKOT Take 2 tablets (17.2 mg total) by mouth 2 (two) times daily.         Follow-up Information     Delbert Clam, MD Follow up in 1 week(s).   Specialty: Family Medicine Contact information: [address] [place] [address] [facility] [place] [address] [phone]                Allergies  Allergen Reactions   Flexeril [Cyclobenzaprine] Other (See Comments)    Caused a lot of sedation and did not work   Codeine Itching and Rash   Fentanyl Swelling, Rash and Other (See Comments)    Skin rash, local swelling from patch   Tramadol Nausea Only and Other (See Comments)    GI upset,also trembling sensation    You were cared for by a hospitalist during your hospital stay. If you have any questions about your discharge medications or the care you received while you were in the hospital after you are discharged, you can call the unit and asked to speak with the hospitalist on call if the hospitalist that took care of you is not available. Once you are discharged, your primary care physician will handle any further medical issues. Please note that no refills for any discharge medications will be authorized once you are discharged, as it is imperative that you return to your primary care physician (or establish a relationship with a primary care physician if you do not have one) for your aftercare needs so that they can reassess your need for medications and monitor your lab values.  You were cared for by a hospitalist during your hospital stay. If you have any questions about your discharge medications or the care you received while you were in the hospital after you are discharged, you can call the unit and asked to speak with the hospitalist on call if the hospitalist that took care of you is not available. Once you are discharged, your primary care physician will handle any further medical issues. Please note that NO REFILLS for any discharge medications will be authorized once you are discharged, as it is imperative that you return to your primary care physician (or  establish a relationship with a primary care physician if you do not have one) for your aftercare needs so that they can reassess your need for medications and monitor your lab values.  Please request your Prim.MD to go over all Hospital Tests and Procedure/Radiological results at the follow up, please get all Hospital records sent to your Prim MD by signing hospital release before you go home.  Get CBC, CMP, 2 view Chest X ray checked  by Primary MD during your next visit or SNF MD in 5-7 days ( we routinely change or add medications that can affect your baseline labs and fluid status, therefore we recommend that you get the mentioned basic workup next visit with your PCP, your PCP may decide not to get  them or add new tests based on their clinical decision)  On your next visit with your primary care physician please Get Medicines reviewed and adjusted.  If you experience worsening of your admission symptoms, develop shortness of breath, life threatening emergency, suicidal or homicidal thoughts you must seek medical attention immediately by calling 911 or calling your MD immediately  if symptoms less severe.  You Must read complete instructions/literature along with all the possible adverse reactions/side effects for all the Medicines you take and that have been prescribed to you. Take any new Medicines after you have completely understood and accpet all the possible adverse reactions/side effects.   Do not drive, operate heavy machinery, perform activities at heights, swimming or participation in water activities or provide baby sitting services if your were admitted for syncope or siezures until you have seen by Primary MD or a Neurologist and advised to do so again.  Do not drive when taking Pain medications.   Procedures/Studies: CT ABDOMEN PELVIS W CONTRAST Result Date: 02/08/2024 CLINICAL DATA:  Abdominal pain. History of squamous cell carcinoma of the cervix. * Tracking Code: BO * EXAM:  CT ABDOMEN AND PELVIS WITH CONTRAST TECHNIQUE: Multidetector CT imaging of the abdomen and pelvis was performed using the standard protocol following bolus administration of intravenous contrast. RADIATION DOSE REDUCTION: This exam was performed according to the departmental dose-optimization program which includes automated exposure control, adjustment of the mA and/or kV according to patient size and/or use of iterative reconstruction technique. CONTRAST:  OMNIPAQUE IOHEXOL 300 MG/ML  SOLN COMPARISON:  06/08/2023 FINDINGS: Lower chest: Dependent atelectasis bilaterally. Hepatobiliary: No suspicious focal abnormality within the liver parenchyma. There is no evidence for gallstones, gallbladder wall thickening, or pericholecystic fluid. No intrahepatic or extrahepatic biliary dilation. Pancreas: No focal mass lesion. No dilatation of the main duct. No intraparenchymal cyst. No peripancreatic edema. Spleen: No splenomegaly. No suspicious focal mass lesion. Adrenals/Urinary Tract: No adrenal nodule or mass. Tiny well-defined homogeneous low-density lesion in the left kidney is too small to characterize but is statistically most likely benign and probably a cyst. No followup imaging is recommended. Moderate right hydroureteronephrosis evident, new in the interval. Ureteral dilatation extends down to the right pelvic sidewall where it becomes incorporated into the pelvic mass. Foley catheter decompresses the urinary bladder. Gas in the bladder lumen is compatible with the instrumentation. Stomach/Bowel: Stomach is unremarkable. No gastric wall thickening. No evidence of outlet obstruction. Duodenum is normally positioned as is the ligament of Treitz. No small bowel wall thickening. No small bowel dilatation. The terminal ileum is normal. The appendix is normal. Gaseous distension of the non dependent: Without evidence for colonic obstruction. Vascular/Lymphatic: There is mild atherosclerotic calcification of the  abdominal aorta without aneurysm. There is no gastrohepatic or hepatoduodenal ligament lymphadenopathy. No retroperitoneal or mesenteric lymphadenopathy. No pelvic sidewall lymphadenopathy. Reproductive: Uterus is unenlarged.  There is no adnexal mass. Other: Interval progression of the posterior pelvic and sacral lesion progression is best appreciated on sagittal imaging (compare image 107/9 today to image 89/9 previously. There is a new bulky soft tissue component anterior to the L5 vertebral body measuring 4.1 x 2.8 cm on sagittal imaging corresponding to a new soft tissue mass lesion seen on axial 59/2. This incorporates the proximal right internal and external iliac arteries and represents the lesion that obstructs the right ureter (visible on 60/2). Axial imaging shows extension of tumor into the sciatic notch region bilaterally. Musculoskeletal: Large pelvic mass with involvement of the L5 vertebral body and  destruction of the upper sacrum as above. Involvement of the cranial aspect of the SI joints noted bilaterally. Chronic fracture nonunion right pubic bone. IMPRESSION: 1. Interval progression of the posterior pelvic and sacral lesion with new bulky soft tissue component anterior to the L5 vertebral body. This incorporates the proximal right internal and external iliac arteries (which remain patent) and represents the site of new right ureteral obstruction. 2. Moderate right hydroureteronephrosis, new in the interval. 3. Gaseous distension of the non dependent colon without evidence for colonic obstruction. 4.  Aortic Atherosclerosis (ICD10-I70.0). Electronically Signed   By: Camellia Candle M.D.   On: 02/08/2024 08:03     The results of significant diagnostics from this hospitalization (including imaging, microbiology, ancillary and laboratory) are listed below for reference.     Microbiology: Recent Results (from the past 240 hours)  Urine Culture     Status: Abnormal   Collection Time: 02/08/24   4:11 AM   Specimen: Urine, Random  Result Value Ref Range Status   Specimen Description   Final    URINE, RANDOM Performed at [facility], [address] [place], [place] [address]    Special Requests   Final    NONE Reflexed from [phone] [phone] Performed at [facility], [address], [place], [place] [address]    Culture >=100,000 COLONIES/mL ESCHERICHIA COLI (A)  Final   Report Status 02/10/2024 FINAL  Final   Organism ID, Bacteria ESCHERICHIA COLI (A)  Final      Susceptibility   Escherichia coli - MIC*    AMPICILLIN 4 SENSITIVE Sensitive     CEFAZOLIN (URINE) Value in next row Sensitive      2 SENSITIVEThis is a modified FDA-approved test that has been validated and its performance characteristics determined by the reporting laboratory.  This laboratory is certified under the Clinical Laboratory Improvement Amendments CLIA as qualified to perform high complexity clinical laboratory testing.    CEFEPIME Value in next row Sensitive      2 SENSITIVEThis is a modified FDA-approved test that has been validated and its performance characteristics determined by the reporting laboratory.  This laboratory is certified under the Clinical Laboratory Improvement Amendments CLIA as qualified to perform high complexity clinical laboratory testing.    ERTAPENEM Value in next row Sensitive      2 SENSITIVEThis is a modified FDA-approved test that has been validated and its performance characteristics determined by the reporting laboratory.  This laboratory is certified under the Clinical Laboratory Improvement Amendments CLIA as qualified to perform high complexity clinical laboratory testing.    CEFTRIAXONE  Value in next row Sensitive      2 SENSITIVEThis is a modified FDA-approved test that has been validated and its performance characteristics determined by the reporting laboratory.  This laboratory is certified under the Clinical Laboratory Improvement Amendments  CLIA as qualified to perform high complexity clinical laboratory testing.    CIPROFLOXACIN Value in next row Sensitive      2 SENSITIVEThis is a modified FDA-approved test that has been validated and its performance characteristics determined by the reporting laboratory.  This laboratory is certified under the Clinical Laboratory Improvement Amendments CLIA as qualified to perform high complexity clinical laboratory testing.    GENTAMICIN Value in next row Sensitive      2 SENSITIVEThis is a modified FDA-approved test that has been validated and its performance characteristics determined by the reporting laboratory.  This laboratory is certified under the Clinical Laboratory Improvement Amendments CLIA as qualified  to perform high complexity clinical laboratory testing.    NITROFURANTOIN Value in next row Sensitive      2 SENSITIVEThis is a modified FDA-approved test that has been validated and its performance characteristics determined by the reporting laboratory.  This laboratory is certified under the Clinical Laboratory Improvement Amendments CLIA as qualified to perform high complexity clinical laboratory testing.    TRIMETH/SULFA Value in next row Sensitive      2 SENSITIVEThis is a modified FDA-approved test that has been validated and its performance characteristics determined by the reporting laboratory.  This laboratory is certified under the Clinical Laboratory Improvement Amendments CLIA as qualified to perform high complexity clinical laboratory testing.    AMPICILLIN/SULBACTAM Value in next row Sensitive      2 SENSITIVEThis is a modified FDA-approved test that has been validated and its performance characteristics determined by the reporting laboratory.  This laboratory is certified under the Clinical Laboratory Improvement Amendments CLIA as qualified to perform high complexity clinical laboratory testing.    PIP/TAZO Value in next row Sensitive      <=4 SENSITIVEThis is a modified  FDA-approved test that has been validated and its performance characteristics determined by the reporting laboratory.  This laboratory is certified under the Clinical Laboratory Improvement Amendments CLIA as qualified to perform high complexity clinical laboratory testing.    MEROPENEM Value in next row Sensitive      <=4 SENSITIVEThis is a modified FDA-approved test that has been validated and its performance characteristics determined by the reporting laboratory.  This laboratory is certified under the Clinical Laboratory Improvement Amendments CLIA as qualified to perform high complexity clinical laboratory testing.    * >=100,000 COLONIES/mL ESCHERICHIA COLI     Labs: BNP (last 3 results) No results for input(s): BNP in the last 8760 hours. Basic Metabolic Panel: Recent Labs  Lab 02/10/24 0658 02/11/24 0635  NA 143 145  K 3.7 3.4*  CL 105 107  CO2 27 29  GLUCOSE 87 152*  BUN 17 17  CREATININE 0.55 0.61  CALCIUM 9.5 9.5   Liver Function Tests: No results for input(s): AST, ALT, ALKPHOS, BILITOT, PROT, ALBUMIN in the last 168 hours. No results for input(s): LIPASE, AMYLASE in the last 168 hours. No results for input(s): AMMONIA in the last 168 hours. CBC: Recent Labs  Lab 02/10/24 0658 02/11/24 0635  WBC 12.2* 9.0  HGB 9.8* 9.6*  HCT 32.5* 32.2*  MCV 90.5 91.5  PLT 402* 405*   Cardiac Enzymes: No results for input(s): CKTOTAL, CKMB, CKMBINDEX, TROPONINI in the last 168 hours. BNP: Invalid input(s): POCBNP CBG: Recent Labs  Lab 02/15/24 1147 02/15/24 1712 02/15/24 2159 02/16/24 0729 02/16/24 1146  GLUCAP 109* 131* 172* 125* 136*   D-Dimer No results for input(s): DDIMER in the last 72 hours. Hgb A1c No results for input(s): HGBA1C in the last 72 hours. Lipid Profile No results for input(s): CHOL, HDL, LDLCALC, TRIG, CHOLHDL, LDLDIRECT in the last 72 hours. Thyroid function studies No results for input(s):  TSH, T4TOTAL, T3FREE, THYROIDAB in the last 72 hours.  Invalid input(s): FREET3 Anemia work up No results for input(s): VITAMINB12, FOLATE, FERRITIN, TIBC, IRON, RETICCTPCT in the last 72 hours. Urinalysis    Component Value Date/Time   COLORURINE AMBER (A) 02/08/2024 0411   APPEARANCEUR CLOUDY (A) 02/08/2024 0411   LABSPEC 1.013 02/08/2024 0411   LABSPEC 1.010 08/26/2016 1131   PHURINE 6.0 02/08/2024 0411   GLUCOSEU NEGATIVE 02/08/2024 0411   GLUCOSEU Negative 08/26/2016 1131  HGBUR MODERATE (A) 02/08/2024 0411   BILIRUBINUR NEGATIVE 02/08/2024 0411   BILIRUBINUR Negative 08/26/2016 1131   KETONESUR NEGATIVE 02/08/2024 0411   PROTEINUR 100 (A) 02/08/2024 0411   UROBILINOGEN 0.2 08/26/2016 1131   NITRITE NEGATIVE 02/08/2024 0411   LEUKOCYTESUR LARGE (A) 02/08/2024 0411   LEUKOCYTESUR Trace 08/26/2016 1131   Sepsis Labs Recent Labs  Lab 02/10/24 0658 02/11/24 0635  WBC 12.2* 9.0   Microbiology Recent Results (from the past 240 hours)  Urine Culture     Status: Abnormal   Collection Time: 02/08/24  4:11 AM   Specimen: Urine, Random  Result Value Ref Range Status   Specimen Description   Final    URINE, RANDOM Performed at [facility], [address], [place], [place] [address]    Special Requests   Final    NONE Reflexed from [id number] Performed at [facility], [address] [place], [place] [address]    Culture >=100,000 COLONIES/mL ESCHERICHIA COLI (A)  Final   Report Status 02/10/2024 FINAL  Final   Organism ID, Bacteria ESCHERICHIA COLI (A)  Final      Susceptibility   Escherichia coli - MIC*    AMPICILLIN 4 SENSITIVE Sensitive     CEFAZOLIN (URINE) Value in next row Sensitive      2 SENSITIVEThis is a modified FDA-approved test that has been validated and its performance characteristics determined by the reporting laboratory.  This laboratory is certified under the Clinical Laboratory Improvement  Amendments CLIA as qualified to perform high complexity clinical laboratory testing.    CEFEPIME Value in next row Sensitive      2 SENSITIVEThis is a modified FDA-approved test that has been validated and its performance characteristics determined by the reporting laboratory.  This laboratory is certified under the Clinical Laboratory Improvement Amendments CLIA as qualified to perform high complexity clinical laboratory testing.    ERTAPENEM Value in next row Sensitive      2 SENSITIVEThis is a modified FDA-approved test that has been validated and its performance characteristics determined by the reporting laboratory.  This laboratory is certified under the Clinical Laboratory Improvement Amendments CLIA as qualified to perform high complexity clinical laboratory testing.    CEFTRIAXONE  Value in next row Sensitive      2 SENSITIVEThis is a modified FDA-approved test that has been validated and its performance characteristics determined by the reporting laboratory.  This laboratory is certified under the Clinical Laboratory Improvement Amendments CLIA as qualified to perform high complexity clinical laboratory testing.    CIPROFLOXACIN Value in next row Sensitive      2 SENSITIVEThis is a modified FDA-approved test that has been validated and its performance characteristics determined by the reporting laboratory.  This laboratory is certified under the Clinical Laboratory Improvement Amendments CLIA as qualified to perform high complexity clinical laboratory testing.    GENTAMICIN Value in next row Sensitive      2 SENSITIVEThis is a modified FDA-approved test that has been validated and its performance characteristics determined by the reporting laboratory.  This laboratory is certified under the Clinical Laboratory Improvement Amendments CLIA as qualified to perform high complexity clinical laboratory testing.    NITROFURANTOIN Value in next row Sensitive      2 SENSITIVEThis is a modified  FDA-approved test that has been validated and its performance characteristics determined by the reporting laboratory.  This laboratory is certified under the Clinical Laboratory Improvement Amendments CLIA as qualified to perform high complexity clinical laboratory testing.  TRIMETH/SULFA Value in next row Sensitive      2 SENSITIVEThis is a modified FDA-approved test that has been validated and its performance characteristics determined by the reporting laboratory.  This laboratory is certified under the Clinical Laboratory Improvement Amendments CLIA as qualified to perform high complexity clinical laboratory testing.    AMPICILLIN/SULBACTAM Value in next row Sensitive      2 SENSITIVEThis is a modified FDA-approved test that has been validated and its performance characteristics determined by the reporting laboratory.  This laboratory is certified under the Clinical Laboratory Improvement Amendments CLIA as qualified to perform high complexity clinical laboratory testing.    PIP/TAZO Value in next row Sensitive      <=4 SENSITIVEThis is a modified FDA-approved test that has been validated and its performance characteristics determined by the reporting laboratory.  This laboratory is certified under the Clinical Laboratory Improvement Amendments CLIA as qualified to perform high complexity clinical laboratory testing.    MEROPENEM Value in next row Sensitive      <=4 SENSITIVEThis is a modified FDA-approved test that has been validated and its performance characteristics determined by the reporting laboratory.  This laboratory is certified under the Clinical Laboratory Improvement Amendments CLIA as qualified to perform high complexity clinical laboratory testing.    * >=100,000 COLONIES/mL ESCHERICHIA COLI     Time coordinating discharge:  I have spent 35 minutes face to face with the patient and on the ward discussing the patients care, assessment, plan and disposition with other care givers.  >50% of the time was devoted counseling the patient about the risks and benefits of treatment/Discharge disposition and coordinating care.   SIGNED:   Burgess JAYSON Dare, MD  [facility] 02/16/2024, 1:01 PM   If 7PM-7AM, please contact night-coverage

## 2024-02-16 NOTE — Care Plan (Signed)
"    Problem: Skin Integrity:  Goal: Risk for impaired skin integrity will decrease  Outcome: Progressing     Problem: Nutrition:  Goal: Adequate nutrition will be maintained  Outcome: Progressing     Problem: Safety:  Goal: Ability to remain free from injury will improve  Outcome: Progressing     "

## 2024-02-16 NOTE — ED Provider Notes (Signed)
 "[facility] EMERGENCY DEPT  EMERGENCY DEPARTMENT HISTORY AND PHYSICAL EXAM      Date of evaluation: 02/16/2024  Patient Name: Erica Zamora 1960-06-16  MRN: [id number]  ED Provider: Butler Loosen, MD   Note Started: 11:21 PM EST 02/16/24    HISTORY OF PRESENT ILLNESS     Chief Complaint   Patient presents with    Nausea    Vomiting       History Provided By: Patient, EMS and daughter/son     HPI: Erica Zamora is a 63 y.o. female presenting for evaluation of n/v onset earlier today. Patient was being transported from [place] to here, states she was vomiting the whole time. 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     PAST MEDICAL HISTORY   Past Medical History:  Past Medical History:   Diagnosis Date    Arthritis     Essential hypertension        Past Surgical History:  Past Surgical History:   Procedure Laterality Date    CT NEEDLE BIOPSY SPINE  04/27/2023    CT NEEDLE BIOPSY SPINE 04/27/2023       Family History:  No family history on file.    Social History:  Social History     Tobacco Use    Smoking status: Every Day     Current packs/day: 0.50     Types: Cigarettes    Smokeless tobacco: Never   Substance Use Topics    Alcohol use: Not Currently    Drug use: Not Currently       Allergies:  Allergies   Allergen Reactions    Fentanyl Itching, Other (See Comments) and Swelling     Burning    Tramadol Nausea Only       PCP: Jarold Register, APRN - NP    Current Meds:   Current Facility-Administered Medications   Medication Dose Route Frequency Provider Last Rate Last Admin    diphenhydrAMINE  (BENADRYL ) injection 25 mg  25 mg IntraVENous NOW Pearley Baranek, MD         Current Outpatient Medications   Medication Sig Dispense Refill    ondansetron  (ZOFRAN -ODT) 4 MG disintegrating tablet Take 1 tablet by mouth 3 times daily as needed for Nausea or Vomiting 30 tablet 1     methylPREDNISolone  (MEDROL  DOSEPACK) 4 MG tablet Take with food. 1 kit 0    gabapentin (NEURONTIN) 300 MG capsule Take 1 capsule by mouth nightly. Max Daily Amount: 300 mg      ALPRAZolam (XANAX) 0.25 MG tablet ceived the following from [facility]: Outside name: ALPRAZolam (XANAX) 0.25 mg tablet      cetirizine (ZYRTEC) 10 MG tablet Take 1 tablet by mouth daily      fluticasone (FLONASE) 50 MCG/ACT nasal spray 1 spray daily as needed      HYDROmorphone  (DILAUDID ) 4 MG tablet Take 4 mg by mouth every 6 hours as needed.      hydrOXYzine HCl (ATARAX) 50 MG tablet Take 50 mg by mouth 3 times daily as needed      levocetirizine (XYZAL) 5 MG tablet Take 5 mg by mouth daily      lidocaine  (LIDODERM ) 5 % ceived the following from [facility]: Outside name: lidocaine  (LIDODERM ) 5 %      lisinopril-hydroCHLOROthiazide (PRINZIDE;ZESTORETIC) 10-12.5 MG per tablet  Take by mouth daily      methadone 10 MG/5ML solution Take 70 mg by mouth daily.      omeprazole (PRILOSEC) 20 MG delayed release capsule ceived the following from [facility]: Outside name: omeprazole (PRILOSEC) 20 mg capsule      pravastatin (PRAVACHOL) 10 MG tablet ceived the following from [facility]: Outside name: pravastatin (PRAVACHOL) 10 mg tablet         Social Determinants of Health:   Social Drivers of Health     Tobacco Use: High Risk (02/16/2024)    Patient History     Smoking Tobacco Use: Every Day     Smokeless Tobacco Use: Never     Passive Exposure: Not on file   Alcohol Use: Unknown (11/26/2021)    Received from [facility]    AUDIT-C     Frequency of Alcohol Consumption: Not on file     Q2: How many drinks containing alcohol do you have on a typical day when you are drinking?: Patient does not drink     Frequency of Binge Drinking: Not on file   Financial Resource Strain: Not on file   Food Insecurity: No Food Insecurity (02/08/2024)    Received from [facility]    Hunger Vital Sign      Within the past 12 months, you worried that your food would run out before you got the money to buy more.: Never true     Within the past 12 months, the food you bought just didn't last and you didn't have money to get more.: Never true   Transportation Needs: No Transportation Needs (02/08/2024)    Received from [facility]     In the past 12 months, has lack of transportation kept you from medical appointments or from getting medications?: No     In the past 12 months, has lack of transportation kept you from meetings, work, or from getting things needed for daily living?: No   Physical Activity: Not on file   Stress: Not on file   Social Connections: Socially Isolated (02/08/2024)    Received from [facility]    Social Connection and Isolation Panel     In a typical week, how many times do you talk on the phone with family, friends, or neighbors?: More than three times a week     How often do you get together with friends or relatives?: Once a week     How often do you attend church or religious services?: Never     Do you belong to any clubs or organizations such as church groups, unions, fraternal or athletic groups, or school groups?: No     How often do you attend meetings of the clubs or organizations you belong to?: Never     Are you married, widowed, divorced, separated, never married, or living with a partner?: Widowed   Intimate Partner Violence: Not At Risk (02/08/2024)    Received from [facility]    Humiliation, Afraid, Rape, and Kick questionnaire     Within the last year, have you been afraid of your partner or ex-partner?: No     Within the last year, have you been humiliated or emotionally abused in other ways by your partner or ex-partner?: No     Within the last year, have you been kicked, hit, slapped, or otherwise physically hurt by your partner or ex-partner?: No  Within the last year, have you been raped or forced to have any kind of sexual activity by your  partner or ex-partner?: No   Depression: Not at risk (11/02/2022)    Received from [facility]    PHQ-2     PHQ-2 Total Score: 0   Housing Stability: Unknown (02/27/2023)    Received from [name] Stability Vital Sign     In the last 12 months, was there a time when you were not able to pay the mortgage or rent on time?: No     Number of Times Moved in the Last Year: Not on file     Homeless in the Last Year: Not on file   Interpersonal Safety: Not At Risk (02/08/2024)    Received from [facility]    Humiliation, Afraid, Rape, and Kick questionnaire     Within the last year, have you been afraid of your partner or ex-partner?: No     Within the last year, have you been humiliated or emotionally abused in other ways by your partner or ex-partner?: No     Within the last year, have you been kicked, hit, slapped, or otherwise physically hurt by your partner or ex-partner?: No     Within the last year, have you been raped or forced to have any kind of sexual activity by your partner or ex-partner?: No   Utilities: Not At Risk (02/08/2024)    Received from [facility] Utilities     In the past 12 months has the electric, gas, oil, or water company threatened to shut off services in your home?: No     PHYSICAL EXAM   Physical Exam  Vitals and nursing note reviewed.   Constitutional:       General: She is not in acute distress.     Appearance: She is not diaphoretic.      Comments: Chronically ill appearing   HENT:      Head: Normocephalic and atraumatic.      Mouth/Throat:      Mouth: Mucous membranes are dry.   Cardiovascular:      Rate and Rhythm: Normal rate and regular rhythm.      Pulses: Normal pulses.      Heart sounds: Normal heart sounds.   Pulmonary:      Effort: Pulmonary effort is normal. No respiratory distress.      Breath sounds: Normal breath sounds. No wheezing, rhonchi or rales.   Abdominal:      General: There is no distension.      Palpations: Abdomen is soft.      Tenderness:  There is no abdominal tenderness. There is no guarding or rebound.   Musculoskeletal:         General: No deformity.   Skin:     General: Skin is warm and dry.      Capillary Refill: Capillary refill takes less than 2 seconds.      Coloration: Skin is pale.      Findings: No rash.   Neurological:      Mental Status: She is alert and oriented to person, place, and time.      Comments: Normal speech. No facial asymmetry         SCREENINGS                No data recorded       LAB, EKG AND DIAGNOSTIC RESULTS   Labs:  Recent Results (from the past 12 hours)   CMP    Collection Time: 02/16/24 11:59 PM   Result Value Ref Range    Sodium 137 136 - 145 mmol/L    Potassium 3.4 (L) 3.5 - 5.5 mmol/L    Chloride 96 (L) 98 - 107 mmol/L    CO2 30 21 - 32 mmol/L    Anion Gap 11 3 - 18 mmol/L    Glucose 122 (H) 74 - 108 mg/dL    BUN 10 6 - 23 MG/DL    Creatinine 9.35 9.39 - 1.30 MG/DL    BUN/Creatinine Ratio 16 12 - 20      Est, Glom Filt Rate >90 >60 ml/min/1.54m2    Calcium 9.6 8.6 - 10.2 MG/DL    Total Bilirubin 0.3 0.2 - 1.0 MG/DL    ALT 8 (L) 10 - 35 U/L    AST 16 10 - 38 U/L    Alk Phosphatase 60 45 - 117 U/L    Total Protein 7.0 6.4 - 8.2 g/dL    Albumin 2.0 (L) 3.4 - 5.0 g/dL    Globulin 5.0 (H) 2.0 - 4.0 g/dL    Albumin/Globulin Ratio 0.4 (L) 0.8 - 1.7     CBC with Auto Differential    Collection Time: 02/16/24 11:59 PM   Result Value Ref Range    WBC 13.2 4.6 - 13.2 K/uL    RBC 3.58 (L) 4.20 - 5.30 M/uL    Hemoglobin 9.8 (L) 12.0 - 16.0 g/dL    Hematocrit 68.9 (L) 35.0 - 45.0 %    MCV 86.6 78.0 - 100.0 FL    MCH 27.4 24.0 - 34.0 PG    MCHC 31.6 31.0 - 37.0 g/dL    RDW 85.2 (H) 88.3 - 14.5 %    Platelets 385 135 - 420 K/uL    MPV 9.7 9.2 - 11.8 FL    Nucleated RBCs 0.0 0 PER 100 WBC    nRBC 0.00 0.00 - 0.01 K/uL    Neutrophils % 83.9 (H) 40.0 - 73.0 %    Lymphocytes % 8.0 (L) 21.0 - 52.0 %    Monocytes % 4.1 3.0 - 10.0 %    Eosinophils % 0.2 0.0 - 5.0 %    Basophils % 0.2 0.0 - 2.0 %    Immature Granulocytes % 3.6 (H) 0.0  - 0.5 %    Neutrophils Absolute 11.09 (H) 1.80 - 8.00 K/UL    Lymphocytes Absolute 1.06 0.90 - 3.30 K/UL    Monocytes Absolute 0.54 0.05 - 1.20 K/UL    Eosinophils Absolute 0.02 0.00 - 0.40 K/UL    Basophils Absolute 0.03 0.00 - 0.10 K/UL    Immature Granulocytes Absolute 0.48 (H) 0.00 - 0.04 K/UL    Differential Type AUTOMATED     Urinalysis    Collection Time: 02/17/24  3:15 AM   Result Value Ref Range    Color, UA DARK YELLOW      Appearance TURBID      Specific Gravity, UA 1.027 1.005 - 1.030      pH, Urine 6.5 5.0 - 8.0      Protein, UA 300 (A) NEG mg/dL    Glucose, Ur Negative NEG mg/dL    Ketones, Urine 15 (A) NEG mg/dL    Bilirubin, Urine Negative NEG      Blood, Urine LARGE (A) NEG      Urobilinogen, Urine 1.0 0.2 - 1.0 EU/dL    Nitrite, Urine Negative NEG  Leukocyte Esterase, Urine LARGE (A) NEG     Urinalysis, Micro    Collection Time: 02/17/24  3:15 AM   Result Value Ref Range    WBC, UA TOO NUMEROUS TO COUNT 0 - 5 /hpf    RBC, UA 11-20 0 - 5 /hpf    BACTERIA, URINE 3+ (A) NEG /hpf       EKG:.Not Applicable     Radiologic Studies:  Radiographic images are visualized and preliminarily interpreted by the ED Provider with the following findings: Not Applicable..     Interpretation per the Radiologist below, if available at the time of this note:  No orders to display        Records Reviewed: reviewed outside hospital records from [facility]    MEDICAL DECISION MAKING and ED COURSE   6:37 AM Differential and Considerations of tests not ordered:    DDX including bowel obstruction, sepsis, electrolyte abnormality, others    Patient with advanced metastatic cancer here for establishing palliative care at home. Patient expresses desire for minimal intervention. She is okay with antibiotics. She arrives afebrile with normal vitals. Primary c/o is vomiting. Abdominal exam is benign. Per review of record she was diagnosed with hydroureter 2/2 bladder invasion of mass and treated for UTI at OSH. This was deemed too high  risk for intervention given the extent of metastasis. Patient's primary concern was that she threw up her pain medications. Minimal workup ordered in line with patient's wishes and goals of care. Of note urine obtained from indwelling foley and pt previously colonized, will await culture. Otherwise no sepsis markers. Patient symptomatically improved in ED. She prefers discharge home. Additional palliative care referral placed.      Vitals:    Vitals:    02/17/24 0344 02/17/24 0404 02/17/24 0414 02/17/24 0430   BP: 107/60 101/60 (!) 108/58 (!) 113/55   Pulse: 87 (!) 102 95 93   Resp: 12 10 10 10    Temp:       SpO2: 94% 93% 93% 94%   Weight:       Height:           ED COURSE  ED Course as of 02/17/24 0637   Sat Feb 17, 2024   0230 Patient sleeping comfortably. No further vomiting. Tolerated po ativan . [NV]   0400 Urine sample from bag and likely with colonization - will await culture and specificity to treat. Patient stable for discharge at this time. Discussed w/ family at bedside. [NV]      ED Course User Index  [NV] Lonnie Rile, MD       Clinical Management Tools:  Not Applicable    Patient was given the following medications:  Medications   diphenhydrAMINE  (BENADRYL ) injection 25 mg (25 mg IntraVENous Not Given 02/17/24 0001)   sodium chloride  0.9 % bolus 1,000 mL (0 mLs IntraVENous Stopped 02/17/24 0402)   HYDROmorphone  HCl PF (DILAUDID ) injection 1 mg (1 mg IntraVENous Given 02/17/24 0001)   LORazepam  (ATIVAN ) tablet 2 mg (2 mg Oral Given 02/17/24 0040)       CONSULTS: See ED Course/MDM for further details.  None     PROCEDURES   Unless otherwise noted above, none  Procedures      SEPSIS REASSESSMENT & CRITICAL CARE TIME   SEPSIS REASSESSMENT: No suspicion of bacterial infection and not having 2 SIRS during this visit.    CLINICAL IMPRESSIONS     1. Nausea and vomiting, unspecified vomiting type    2. Mild dehydration  3. Metastatic malignant neoplasm, unspecified site [facility])       SDOH/DISPOSITION/PLAN    Social Determinants affecting Treatment Plan: None    DISPOSITION Decision To Discharge 02/17/2024 04:01:18 AM   DISPOSITION CONDITION Stable     Discharge Note: The patient is stable for discharge home. The signs, symptoms, diagnosis, and discharge instructions have been discussed, understanding conveyed, and agreed upon. The patient is to follow up as recommended or return to ER should their symptoms worsen.      PATIENT REFERRED TO:  Jarold Register, APRN - NP  [address] [place] [address]  [phone]    Schedule an appointment as soon as possible for a visit   As needed, If symptoms worsen    [facility] PALLI[facility]  [address]  [place]  [address]  [phone]            DISCHARGE MEDICATIONS:     Medication List        START taking these medications      ondansetron  4 MG disintegrating tablet  Commonly known as: ZOFRAN -ODT  Take 1 tablet by mouth 3 times daily as needed for Nausea or Vomiting            ASK your doctor about these medications      ALPRAZolam 0.25 MG tablet  Commonly known as: XANAX     cetirizine 10 MG tablet  Commonly known as: ZYRTEC     fluticasone 50 MCG/ACT nasal spray  Commonly known as: FLONASE     gabapentin 300 MG capsule  Commonly known as: NEURONTIN     HYDROmorphone  4 MG tablet  Commonly known as: DILAUDID      hydrOXYzine HCl 50 MG tablet  Commonly known as: ATARAX     levocetirizine 5 MG tablet  Commonly known as: XYZAL     lidocaine  5 %  Commonly known as: LIDODERM      lisinopril-hydroCHLOROthiazide 10-12.5 MG per tablet  Commonly known as: PRINZIDE;ZESTORETIC     methadone 10 MG/5ML solution     methylPREDNISolone  4 MG tablet  Commonly known as: MEDROL  DOSEPACK  Take with food.     omeprazole 20 MG delayed release capsule  Commonly known as: PRILOSEC     pravastatin 10 MG tablet  Commonly known as: PRAVACHOL               Where to Get Your Medications        These medications [facility] sent to [facility] [address] [place] - [address] - P [phone] - F [phone]  [address], [place] [address]      Phone: [phone]   ondansetron  4 MG disintegrating tablet           DISCONTINUED MEDICATIONS:  Discharge Medication List as of 02/17/2024  4:37 AM          I am the Primary Clinician of Record. Butler Loosen, MD (electronically signed)        Loosen Butler, MD  02/17/24 [phone]    "

## 2024-02-16 NOTE — Discharge Summary (Signed)
 Physician Discharge Summary  Valerie Kerr FMW:[id number] DOB: 07/05/1960 DOA: 02/08/2024  PCP: Delbert Clam, MD  Admit date: 02/08/2024 Discharge date: 02/16/2024  Admitted From: Home Disposition: Home  Recommendations for Outpatient Follow-up:  Patient being transferred to hospice in [facility] [place]    Discharge Condition: Stable CODE STATUS: DNR Diet recommendation: Regular  Brief Narrative:   63 y.o. 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  Reportedly patient has been on hospice service.  Palliative care team is following this patient. Briefly patient was initiated on PCA pump for better pain control and thereafter transition to p.o. After several days of finding safe disposition, daughter was able to arrange for transportation to [place]  under hospice services.  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  Initially enrolled in hospice of [place] and transition to residential hospice for high-dose PCA.  Family has been trying to transition hospice up in [place]  unfortunately fell through last minute due to the cost of transport.  Family unenrolled from current hospice services and brought her to the hospital to reexplore transportation to [place] .  Currently pain has been controlled with the help of palliative care services. -Per oncology patient is not a candidate for any further advanced treatments    Possible UTI Urine from admission growing E coli.   Urology were consulted, recommended against PCN given chronicity, likely discomfort outweighing unclear benefit. 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  Patient will travel with supplemental oxygen and Foley catheter for comfort as well.  Medications will be delivered to her bedside.  DVT prophylaxis: enoxaparin  (LOVENOX ) injection 40 mg Start: 02/08/24 2200      Code Status: Limited: Do not attempt resuscitation (DNR) -DNR-LIMITED -Do Not Intubate/DNI  Family Communication:   Status is: Inpatient Remains inpatient appropriate because: Ongoing safe disposition anticipate discharge todayat 4 PM.  All the staff is aware to help make arrangements and transition happen smoothly   PT Follow up Recs:   Subjective: No complaints at the time of my visit. She was resting comfortably.    Examination:  General exam: Appears calm and comfortable  Respiratory system: Clear to auscultation. Respiratory effort normal. Cardiovascular system: S1 & S2 heard, RRR. No JVD, murmurs, rubs, gallops or clicks. No pedal edema. Gastrointestinal system: Abdomen is nondistended, soft and nontender. No organomegaly or masses felt. Normal bowel sounds heard. Central nervous system: Alert and oriented. No focal neurological deficits. Extremities: Symmetric 5 x 5 power. Skin: No rashes, lesions or ulcers Psychiatry: Judgement and insight appear normal. Mood & affect appropriate. Foley in place.   Discharge Diagnoses:  Principal Problem:   Acute UTI (urinary tract infection) Active Problems:   Cancer-related breakthrough pain from Metastatic leiomyosarcoma of the bone /Chronic pain syndrome   Essential hypertension   GERD (gastroesophageal reflux disease)   Anemia due to antineoplastic chemotherapy    Hydroureter on right   Hydronephrosis of right kidney  Moderate protein malnutrition   Urinary tract infection with hematuria   Constipation      Discharge Exam: Vitals:   02/15/24 2156 02/16/24 0533  BP: (!) 116/56 120/72  Pulse: 97 99  Resp: 18 18  Temp: 97.9 F (36.6 C) 97.9 F (36.6 C)  SpO2: 96% 100%   Vitals:   02/14/24 1800 02/14/24 2128 02/15/24 2156 02/16/24 0533  BP:  129/73 (!) 116/56 120/72  Pulse:  98 97 99  Resp: 16 18 18 18   Temp:  98.1 F (36.7 C) 97.9 F (36.6 C) 97.9 F (36.6 C)  TempSrc:  Oral    SpO2: 100% 100% 96% 100%  Weight:      Height:          Discharge Instructions   Allergies as of 02/16/2024       Reactions   Flexeril  [cyclobenzaprine ] Other (See Comments)   Caused a lot of sedation and did not work   Codeine Itching, Rash   Fentanyl  Swelling, Rash, Other (See Comments)   Skin rash, local swelling from patch   Tramadol Nausea Only, Other (See Comments)   GI upset,also trembling sensation        Medication List     STOP taking these medications    Azelastine  HCl 137 MCG/SPRAY Soln   Biofreeze 10 % Crea Generic drug: Menthol  (Topical Analgesic)   bisacodyl  10 MG suppository Commonly known as: DULCOLAX   fluticasone  50 MCG/ACT nasal spray Commonly known as: FLONASE    lidocaine  5 % Commonly known as: LIDODERM    lisinopril -hydrochlorothiazide  10-12.5 MG tablet Commonly known as: ZESTORETIC    methadone  10 MG tablet Commonly known as: DOLOPHINE  Replaced by: methadone  10 MG/ML solution   ondansetron  4 MG tablet Commonly known as: ZOFRAN    OVER THE COUNTER MEDICATION   oxycodone  30 MG immediate release tablet Commonly known as: ROXICODONE    pregabalin  100 MG capsule Commonly known as: LYRICA    senna-docusate 8.6-50 MG tablet Commonly known as: Senokot-S   Stool Softener/Laxative 50-8.6 MG tablet Generic drug: senna-docusate       TAKE these medications    ALPRAZolam  0.5 MG tablet Commonly  known as: XANAX  Take 1 tablet (0.5 mg total) by mouth 3 (three) times daily as needed for anxiety. What changed:  medication strength when to take this reasons to take this   dexamethasone  4 MG tablet Commonly known as: DECADRON  Take 1 tablet (4 mg total) by mouth daily for 5 days. Start taking on: February 17, 2024 What changed:  how much to take Another medication with the same name was removed. Continue taking this medication, and follow the directions you see here.   feeding supplement Liqd Take 237 mLs by mouth 2 (two) times daily between meals.   HYDROmorphone  8 MG tablet Commonly known as: DILAUDID  Take 1 tablet (8 mg total) by mouth every 2 (two) hours as needed for severe pain (pain score 7-10) or moderate pain (pain score 4-6). What changed:  how much to take when to take this reasons to take this   ibuprofen  600 MG tablet Commonly known as: ADVIL  Take 1 tablet (600 mg total) by mouth every 8 (eight) hours as needed for moderate pain (pain score 4-6) or mild pain (pain score 1-3).   methadone  10 MG/ML solution Commonly known as: DOLOPHINE  Take 0.5 mLs (5 mg total) by mouth every 12 (twelve) hours. Replaces: methadone  10 MG tablet   multivitamin with minerals Tabs tablet Take 1 tablet by mouth daily. Start taking on: February 17, 2024   omeprazole  20 MG capsule Commonly known as: PRILOSEC Take 1 capsule (20 mg total) by mouth daily for GERD. What changed: Another medication with the same name was removed. Continue taking this medication, and follow the directions you see here.   polyethylene glycol powder 17 GM/SCOOP powder Commonly known as: GLYCOLAX /MIRALAX  Take 17 g by mouth 2 (two) times daily. Dissolve 1 capful (17g) in 4-8 ounces of liquid and take by mouth daily. What changed:  when to take this additional instructions   senna 8.6 MG Tabs tablet Commonly known as: SENOKOT Take 2 tablets (17.2 mg total) by mouth 2 (two) times daily.         Follow-up Information     Delbert Clam, MD Follow up in 1 week(s).   Specialty: Family Medicine Contact information: [address] [place] [address] [place] [address] [phone]                Allergies  Allergen Reactions   Flexeril  [Cyclobenzaprine ] Other (See Comments)    Caused a lot of sedation and did not work   Codeine Itching and Rash   Fentanyl  Swelling, Rash and Other (See Comments)    Skin rash, local swelling from patch   Tramadol Nausea Only and Other (See Comments)    GI upset,also trembling sensation    You were cared for by a hospitalist during your hospital stay. If you have any questions about your discharge medications or the care you received while you were in the hospital after you are discharged, you can call the unit and asked to speak with the hospitalist on call if the hospitalist that took care of you is not available. Once you are discharged, your primary care physician will handle any further medical issues. Please note that no refills for any discharge medications will be authorized once you are discharged, as it is imperative that you return to your primary care physician (or establish a relationship with a primary care physician if you do not have one) for your aftercare needs so that they can reassess your need for medications and monitor your lab values.  You were cared for by a hospitalist during your hospital stay. If you have any questions about your discharge medications or the care you received while you were in the hospital after you are discharged, you can call the unit and asked to speak with the hospitalist on call if the hospitalist that took care of you is not available. Once you are discharged, your primary care physician will handle any further medical issues. Please note that NO REFILLS for any discharge medications will be authorized once you are discharged, as it is imperative that you return to your primary care physician (or  establish a relationship with a primary care physician if you do not have one) for your aftercare needs so that they can reassess your need for medications and monitor your lab values.  Please request your Prim.MD to go over all Hospital Tests and Procedure/Radiological results at the follow up, please get all Hospital records sent to your Prim MD by signing hospital release before you go home.  Get CBC, CMP, 2 view Chest X ray checked  by Primary MD during your next visit or SNF MD in 5-7 days ( we routinely change or add medications that can affect your baseline labs and fluid status, therefore we recommend that you get the mentioned basic workup next visit with your PCP, your PCP may decide not to get  them or add new tests based on their clinical decision)  On your next visit with your primary care physician please Get Medicines reviewed and adjusted.  If you experience worsening of your admission symptoms, develop shortness of breath, life threatening emergency, suicidal or homicidal thoughts you must seek medical attention immediately by calling 911 or calling your MD immediately  if symptoms less severe.  You Must read complete instructions/literature along with all the possible adverse reactions/side effects for all the Medicines you take and that have been prescribed to you. Take any new Medicines after you have completely understood and accpet all the possible adverse reactions/side effects.   Do not drive, operate heavy machinery, perform activities at heights, swimming or participation in water  activities or provide baby sitting services if your were admitted for syncope or siezures until you have seen by Primary MD or a Neurologist and advised to do so again.  Do not drive when taking Pain medications.   Procedures/Studies: CT ABDOMEN PELVIS W CONTRAST Result Date: 02/08/2024 CLINICAL DATA:  Abdominal pain. History of squamous cell carcinoma of the cervix. * Tracking Code: BO * EXAM:  CT ABDOMEN AND PELVIS WITH CONTRAST TECHNIQUE: Multidetector CT imaging of the abdomen and pelvis was performed using the standard protocol following bolus administration of intravenous contrast. RADIATION DOSE REDUCTION: This exam was performed according to the departmental dose-optimization program which includes automated exposure control, adjustment of the mA and/or kV according to patient size and/or use of iterative reconstruction technique. CONTRAST:  OMNIPAQUE  IOHEXOL  300 MG/ML  SOLN COMPARISON:  06/08/2023 FINDINGS: Lower chest: Dependent atelectasis bilaterally. Hepatobiliary: No suspicious focal abnormality within the liver parenchyma. There is no evidence for gallstones, gallbladder wall thickening, or pericholecystic fluid. No intrahepatic or extrahepatic biliary dilation. Pancreas: No focal mass lesion. No dilatation of the main duct. No intraparenchymal cyst. No peripancreatic edema. Spleen: No splenomegaly. No suspicious focal mass lesion. Adrenals/Urinary Tract: No adrenal nodule or mass. Tiny well-defined homogeneous low-density lesion in the left kidney is too small to characterize but is statistically most likely benign and probably a cyst. No followup imaging is recommended. Moderate right hydroureteronephrosis evident, new in the interval. Ureteral dilatation extends down to the right pelvic sidewall where it becomes incorporated into the pelvic mass. Foley catheter decompresses the urinary bladder. Gas in the bladder lumen is compatible with the instrumentation. Stomach/Bowel: Stomach is unremarkable. No gastric wall thickening. No evidence of outlet obstruction. Duodenum is normally positioned as is the ligament of Treitz. No small bowel wall thickening. No small bowel dilatation. The terminal ileum is normal. The appendix is normal. Gaseous distension of the non dependent: Without evidence for colonic obstruction. Vascular/Lymphatic: There is mild atherosclerotic calcification of the  abdominal aorta without aneurysm. There is no gastrohepatic or hepatoduodenal ligament lymphadenopathy. No retroperitoneal or mesenteric lymphadenopathy. No pelvic sidewall lymphadenopathy. Reproductive: Uterus is unenlarged.  There is no adnexal mass. Other: Interval progression of the posterior pelvic and sacral lesion progression is best appreciated on sagittal imaging (compare image 107/9 today to image 89/9 previously. There is a new bulky soft tissue component anterior to the L5 vertebral body measuring 4.1 x 2.8 cm on sagittal imaging corresponding to a new soft tissue mass lesion seen on axial 59/2. This incorporates the proximal right internal and external iliac arteries and represents the lesion that obstructs the right ureter (visible on 60/2). Axial imaging shows extension of tumor into the sciatic notch region bilaterally. Musculoskeletal: Large pelvic mass with involvement of the L5 vertebral body and  destruction of the upper sacrum as above. Involvement of the cranial aspect of the SI joints noted bilaterally. Chronic fracture nonunion right pubic bone. IMPRESSION: 1. Interval progression of the posterior pelvic and sacral lesion with new bulky soft tissue component anterior to the L5 vertebral body. This incorporates the proximal right internal and external iliac arteries (which remain patent) and represents the site of new right ureteral obstruction. 2. Moderate right hydroureteronephrosis, new in the interval. 3. Gaseous distension of the non dependent colon without evidence for colonic obstruction. 4.  Aortic Atherosclerosis (ICD10-I70.0). Electronically Signed   By: Camellia Candle M.D.   On: 02/08/2024 08:03     The results of significant diagnostics from this hospitalization (including imaging, microbiology, ancillary and laboratory) are listed below for reference.     Microbiology: Recent Results (from the past 240 hours)  Urine Culture     Status: Abnormal   Collection Time: 02/08/24   4:11 AM   Specimen: Urine, Random  Result Value Ref Range Status   Specimen Description   Final    URINE, RANDOM Performed at [facility], [address] [place], [place] [address]    Special Requests   Final    NONE Reflexed from [id number] Performed at [facility], [address] [place], [place] [address]    Culture >=100,000 COLONIES/mL ESCHERICHIA COLI (A)  Final   Report Status 02/10/2024 FINAL  Final   Organism ID, Bacteria ESCHERICHIA COLI (A)  Final      Susceptibility   Escherichia coli - MIC*    AMPICILLIN 4 SENSITIVE Sensitive     CEFAZOLIN  (URINE) Value in next row Sensitive      2 SENSITIVEThis is a modified FDA-approved test that has been validated and its performance characteristics determined by the reporting laboratory.  This laboratory is certified under the Clinical Laboratory Improvement Amendments CLIA as qualified to perform high complexity clinical laboratory testing.    CEFEPIME Value in next row Sensitive      2 SENSITIVEThis is a modified FDA-approved test that has been validated and its performance characteristics determined by the reporting laboratory.  This laboratory is certified under the Clinical Laboratory Improvement Amendments CLIA as qualified to perform high complexity clinical laboratory testing.    ERTAPENEM Value in next row Sensitive      2 SENSITIVEThis is a modified FDA-approved test that has been validated and its performance characteristics determined by the reporting laboratory.  This laboratory is certified under the Clinical Laboratory Improvement Amendments CLIA as qualified to perform high complexity clinical laboratory testing.    CEFTRIAXONE  Value in next row Sensitive      2 SENSITIVEThis is a modified FDA-approved test that has been validated and its performance characteristics determined by the reporting laboratory.  This laboratory is certified under the Clinical Laboratory Improvement Amendments  CLIA as qualified to perform high complexity clinical laboratory testing.    CIPROFLOXACIN  Value in next row Sensitive      2 SENSITIVEThis is a modified FDA-approved test that has been validated and its performance characteristics determined by the reporting laboratory.  This laboratory is certified under the Clinical Laboratory Improvement Amendments CLIA as qualified to perform high complexity clinical laboratory testing.    GENTAMICIN Value in next row Sensitive      2 SENSITIVEThis is a modified FDA-approved test that has been validated and its performance characteristics determined by the reporting laboratory.  This laboratory is certified under the Clinical Laboratory Improvement Amendments CLIA as qualified  to perform high complexity clinical laboratory testing.    NITROFURANTOIN  Value in next row Sensitive      2 SENSITIVEThis is a modified FDA-approved test that has been validated and its performance characteristics determined by the reporting laboratory.  This laboratory is certified under the Clinical Laboratory Improvement Amendments CLIA as qualified to perform high complexity clinical laboratory testing.    TRIMETH /SULFA  Value in next row Sensitive      2 SENSITIVEThis is a modified FDA-approved test that has been validated and its performance characteristics determined by the reporting laboratory.  This laboratory is certified under the Clinical Laboratory Improvement Amendments CLIA as qualified to perform high complexity clinical laboratory testing.    AMPICILLIN/SULBACTAM Value in next row Sensitive      2 SENSITIVEThis is a modified FDA-approved test that has been validated and its performance characteristics determined by the reporting laboratory.  This laboratory is certified under the Clinical Laboratory Improvement Amendments CLIA as qualified to perform high complexity clinical laboratory testing.    PIP/TAZO Value in next row Sensitive      <=4 SENSITIVEThis is a modified  FDA-approved test that has been validated and its performance characteristics determined by the reporting laboratory.  This laboratory is certified under the Clinical Laboratory Improvement Amendments CLIA as qualified to perform high complexity clinical laboratory testing.    MEROPENEM Value in next row Sensitive      <=4 SENSITIVEThis is a modified FDA-approved test that has been validated and its performance characteristics determined by the reporting laboratory.  This laboratory is certified under the Clinical Laboratory Improvement Amendments CLIA as qualified to perform high complexity clinical laboratory testing.    * >=100,000 COLONIES/mL ESCHERICHIA COLI     Labs: BNP (last 3 results) No results for input(s): BNP in the last 8760 hours. Basic Metabolic Panel: Recent Labs  Lab 02/10/24 0658 02/11/24 0635  NA 143 145  K 3.7 3.4*  CL 105 107  CO2 27 29  GLUCOSE 87 152*  BUN 17 17  CREATININE 0.55 0.61  CALCIUM  9.5 9.5   Liver Function Tests: No results for input(s): AST, ALT, ALKPHOS, BILITOT, PROT, ALBUMIN in the last 168 hours. No results for input(s): LIPASE, AMYLASE in the last 168 hours. No results for input(s): AMMONIA in the last 168 hours. CBC: Recent Labs  Lab 02/10/24 0658 02/11/24 0635  WBC 12.2* 9.0  HGB 9.8* 9.6*  HCT 32.5* 32.2*  MCV 90.5 91.5  PLT 402* 405*   Cardiac Enzymes: No results for input(s): CKTOTAL, CKMB, CKMBINDEX, TROPONINI in the last 168 hours. BNP: Invalid input(s): POCBNP CBG: Recent Labs  Lab 02/15/24 1147 02/15/24 1712 02/15/24 2159 02/16/24 0729 02/16/24 1146  GLUCAP 109* 131* 172* 125* 136*   D-Dimer No results for input(s): DDIMER in the last 72 hours. Hgb A1c No results for input(s): HGBA1C in the last 72 hours. Lipid Profile No results for input(s): CHOL, HDL, LDLCALC, TRIG, CHOLHDL, LDLDIRECT in the last 72 hours. Thyroid function studies No results for input(s):  TSH, T4TOTAL, T3FREE, THYROIDAB in the last 72 hours.  Invalid input(s): FREET3 Anemia work up No results for input(s): VITAMINB12, FOLATE, FERRITIN, TIBC, IRON, RETICCTPCT in the last 72 hours. Urinalysis    Component Value Date/Time   COLORURINE AMBER (A) 02/08/2024 0411   APPEARANCEUR CLOUDY (A) 02/08/2024 0411   LABSPEC 1.013 02/08/2024 0411   LABSPEC 1.010 08/26/2016 1131   PHURINE 6.0 02/08/2024 0411   GLUCOSEU NEGATIVE 02/08/2024 0411   GLUCOSEU Negative 08/26/2016 1131  HGBUR MODERATE (A) 02/08/2024 0411   BILIRUBINUR NEGATIVE 02/08/2024 0411   BILIRUBINUR Negative 08/26/2016 1131   KETONESUR NEGATIVE 02/08/2024 0411   PROTEINUR 100 (A) 02/08/2024 0411   UROBILINOGEN 0.2 08/26/2016 1131   NITRITE NEGATIVE 02/08/2024 0411   LEUKOCYTESUR LARGE (A) 02/08/2024 0411   LEUKOCYTESUR Trace 08/26/2016 1131   Sepsis Labs Recent Labs  Lab 02/10/24 0658 02/11/24 0635  WBC 12.2* 9.0   Microbiology Recent Results (from the past 240 hours)  Urine Culture     Status: Abnormal   Collection Time: 02/08/24  4:11 AM   Specimen: Urine, Random  Result Value Ref Range Status   Specimen Description   Final    URINE, RANDOM Performed at [facility], [address] [place], [place] [address]    Special Requests   Final    NONE Reflexed from [id number] Performed at [facility], [address], [place], [place] [address]    Culture >=100,000 COLONIES/mL ESCHERICHIA COLI (A)  Final   Report Status 02/10/2024 FINAL  Final   Organism ID, Bacteria ESCHERICHIA COLI (A)  Final      Susceptibility   Escherichia coli - MIC*    AMPICILLIN 4 SENSITIVE Sensitive     CEFAZOLIN  (URINE) Value in next row Sensitive      2 SENSITIVEThis is a modified FDA-approved test that has been validated and its performance characteristics determined by the reporting laboratory.  This laboratory is certified under the Clinical Laboratory Improvement  Amendments CLIA as qualified to perform high complexity clinical laboratory testing.    CEFEPIME Value in next row Sensitive      2 SENSITIVEThis is a modified FDA-approved test that has been validated and its performance characteristics determined by the reporting laboratory.  This laboratory is certified under the Clinical Laboratory Improvement Amendments CLIA as qualified to perform high complexity clinical laboratory testing.    ERTAPENEM Value in next row Sensitive      2 SENSITIVEThis is a modified FDA-approved test that has been validated and its performance characteristics determined by the reporting laboratory.  This laboratory is certified under the Clinical Laboratory Improvement Amendments CLIA as qualified to perform high complexity clinical laboratory testing.    CEFTRIAXONE  Value in next row Sensitive      2 SENSITIVEThis is a modified FDA-approved test that has been validated and its performance characteristics determined by the reporting laboratory.  This laboratory is certified under the Clinical Laboratory Improvement Amendments CLIA as qualified to perform high complexity clinical laboratory testing.    CIPROFLOXACIN  Value in next row Sensitive      2 SENSITIVEThis is a modified FDA-approved test that has been validated and its performance characteristics determined by the reporting laboratory.  This laboratory is certified under the Clinical Laboratory Improvement Amendments CLIA as qualified to perform high complexity clinical laboratory testing.    GENTAMICIN Value in next row Sensitive      2 SENSITIVEThis is a modified FDA-approved test that has been validated and its performance characteristics determined by the reporting laboratory.  This laboratory is certified under the Clinical Laboratory Improvement Amendments CLIA as qualified to perform high complexity clinical laboratory testing.    NITROFURANTOIN  Value in next row Sensitive      2 SENSITIVEThis is a modified  FDA-approved test that has been validated and its performance characteristics determined by the reporting laboratory.  This laboratory is certified under the Clinical Laboratory Improvement Amendments CLIA as qualified to perform high complexity clinical laboratory testing.  TRIMETH /SULFA  Value in next row Sensitive      2 SENSITIVEThis is a modified FDA-approved test that has been validated and its performance characteristics determined by the reporting laboratory.  This laboratory is certified under the Clinical Laboratory Improvement Amendments CLIA as qualified to perform high complexity clinical laboratory testing.    AMPICILLIN/SULBACTAM Value in next row Sensitive      2 SENSITIVEThis is a modified FDA-approved test that has been validated and its performance characteristics determined by the reporting laboratory.  This laboratory is certified under the Clinical Laboratory Improvement Amendments CLIA as qualified to perform high complexity clinical laboratory testing.    PIP/TAZO Value in next row Sensitive      <=4 SENSITIVEThis is a modified FDA-approved test that has been validated and its performance characteristics determined by the reporting laboratory.  This laboratory is certified under the Clinical Laboratory Improvement Amendments CLIA as qualified to perform high complexity clinical laboratory testing.    MEROPENEM Value in next row Sensitive      <=4 SENSITIVEThis is a modified FDA-approved test that has been validated and its performance characteristics determined by the reporting laboratory.  This laboratory is certified under the Clinical Laboratory Improvement Amendments CLIA as qualified to perform high complexity clinical laboratory testing.    * >=100,000 COLONIES/mL ESCHERICHIA COLI     Time coordinating discharge:  I have spent 35 minutes face to face with the patient and on the ward discussing the patients care, assessment, plan and disposition with other care givers.  >50% of the time was devoted counseling the patient about the risks and benefits of treatment/Discharge disposition and coordinating care.   SIGNED:   Burgess JAYSON Dare, MD  [facility] 02/16/2024, 1:01 PM   If 7PM-7AM, please contact night-coverage

## 2024-02-16 NOTE — Plan of Care (Signed)
  Problem: Skin Integrity: Goal: Risk for impaired skin integrity will decrease Outcome: Progressing   Problem: Nutrition: Goal: Adequate nutrition will be maintained Outcome: Progressing   Problem: Safety: Goal: Ability to remain free from injury will improve Outcome: Progressing

## 2024-02-16 NOTE — Progress Notes (Signed)
 Daily Progress Note   Patient Name: Valerie Kerr       Date: 02/16/2024 DOB: 01-Aug-1960  Age: 63 y.o. MRN#: [id number] Attending Physician: Caleen Burgess BROCKS, MD Primary Care Physician: Delbert Clam, MD Admit Date: 02/08/2024 Length of Stay: 8 days  Reason for Consultation/Follow-up: Pain control  Subjective:   Reviewed EMR including recent documentation from hospitalist.  At time of EMR reviewed past 24 hours patient has received as needed p.o. Dilaudid  8 mg x 4 doses and as needed IV Dilaudid  2 mg x 4 doses.  Patient was started on methadone  5 mg every 12 hours during the day on 02/15/2024. Discussed care with hospitalist and RN for medical updates.  Presented to bedside to see patient.  Patient laying comfortably in bed sleeping though easily awakened.  Patient's niece, Barnie, present at bedside.  Discussed importance of patient taking medications to have bowel movement today as she has been refusing medications and interventions to assist with this at times.  Discussed plan for patient to transfer to [place]  today at [facility].  Patient agreement with this plan.  [facility] in [place]  planning to perform intake once she arrives today.  Spent time providing emotional support via active listening for patient and Barnie.  All questions answered at that time.  Note upon medicine team would be available if needed.  Objective:   Vital Signs:  BP 120/72 (BP Location: Left Arm)   Pulse 99   Temp 97.9 F (36.6 C)   Resp 18   Ht 5' 1 (1.549 m)   Wt 68 kg   SpO2 100%   BMI 28.34 kg/m   Physical Exam: General: Awake, chronically ill-appearing Cardiovascular: RRR Respiratory: no increased work of breathing noted, not in respiratory distress Neuro: Awake Psych: Calm  Assessment & Plan:   Assessment: Patient is a 63 year old female with a past medical history of cervical cancer in remission, hypertension, anxiety, herniated cervical disc and lumbar disc moderate protein  calorie malnutrition, failure to thrive, chronic pain, and metastatic leiomyosarcoma who was admitted on 02/08/2024 for management of worsening abdominal pain.  Patient had been at home with support through [facility] which was revoked by family for admission to hospital.  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  Urology consulted for recommendations.  Oncology has stated that patient is not a candidate for additional cancer directed therapies.  Palliative medicine team consulted to assist with pain management.  Recommendations/Plan:  # Symptom management: Patient is receiving these palliative interventions for symptom management with an intent to improve quality of life.   - Pain, in the setting of metastatic leiomyosarcoma   - Continue methadone  oral liquid 5 mg every 12 hours scheduled during the day  - Change as needed oral Dilaudid  to 8 mg every 2hours as needed   - Continue IV Dilaudid  to 2 mg every 2 hours as needed to breakthrough after oral as needed Dilaudid    - EKG on 02/13/2024 noted QTc 426   - Constipation In setting of receiving opioids.  Last BM documented 02/07/2024   - Continue senna 2 tab twice daily   - Continue MiraLAX  17 g daily   - Continue bisacodyl  suppository daily as needed.   - Agree with use of scheduled lactulose  today    - Anxiety   - Continue Xanax  0.5 mg 3 times daily as needed  # Discharge Planning: Home with Hospice  in [place]  today - Current plan is for patient to be transported to [place]  this afternoon.   Thank you for allowing the palliative care team to participate in the care [name].  Tinnie Radar, DO Palliative Care Provider PMT # [phone]  If patient remains symptomatic despite maximum doses, please call PMT at [phone] between 0700 and 1900. Outside of these hours, please call  attending, as PMT does not have night coverage.

## 2024-02-16 NOTE — TOC Transition Note (Signed)
 Transition of Care [facility]) - Discharge Note   Patient Details  Name: Valerie Kerr MRN: [id number] Date of Birth: 1961/01/03  Transition of [facility]) CM/SW Contact:  [name], RN Phone Number:[phone]  02/16/2024, 1:51 PM   Clinical Narrative:    Patient is  discharging to [facility]. Daughter Valerie Kerr has arranged private transport. Address has been verified . Standard discharrge packet has been prepared. DNR form present and signed. Currently there are no further inpatient care management needs.  Inpatient care management will sign off.    Final next level of care: Other (comment) (To [facility] per family request Hospice revoked but daughter has set up new Hospice agency with intake to take place when patient arrives to [facility]) Barriers to Discharge: No Barriers Identified   Patient Goals and CMS Choice Patient states their goals for this hospitalization and ongoing recovery are:: Wants to return home with daughter   Choice offered to / list presented to : NA [place] ownership interest in [facility].provided to::  (n/a)    Discharge Placement                       Discharge Plan and Services Additional resources added to the After Visit Summary for   In-house Referral: NA Discharge Planning Services: CM Consult Post Acute Care Choice: NA          DME Arranged: N/A DME Agency: NA       HH Arranged: NA HH Agency: NA        Social Drivers of Health (SDOH) Interventions SDOH Screenings   Food Insecurity: No Food Insecurity (02/08/2024)  Housing: Low Risk  (02/08/2024)  Transportation Needs: No Transportation Needs (02/08/2024)  Utilities: Not At Risk (02/08/2024)  Depression (PHQ2-9): Low Risk  (04/13/2023)  Social Connections: Socially Isolated (02/08/2024)  Tobacco Use: High Risk (02/08/2024)  Health Literacy: Adequate Health Literacy (04/19/2023)   Received from [facility]     Readmission Risk  Interventions    02/09/2024    3:37 PM  Readmission Risk Prevention Plan  Transportation Screening Complete  Medication Review Oceanographer) Complete  SW Recovery Care/Counseling Consult Complete  Palliative Care Screening Not Applicable  Skilled Nursing Facility Not Applicable

## 2024-02-16 NOTE — Plan of Care (Signed)
  Problem: Education: Goal: Ability to describe self-care measures that may prevent or decrease complications (Diabetes Survival Skills Education) will improve Outcome: Adequate for Discharge Goal: Individualized Educational Video(s) Outcome: Adequate for Discharge   Problem: Fluid Volume: Goal: Ability to maintain a balanced intake and output will improve Outcome: Adequate for Discharge   Problem: Metabolic: Goal: Ability to maintain appropriate glucose levels will improve Outcome: Adequate for Discharge   Problem: Nutritional: Goal: Maintenance of adequate nutrition will improve Outcome: Adequate for Discharge   Problem: Skin Integrity: Goal: Risk for impaired skin integrity will decrease Outcome: Adequate for Discharge   Problem: Tissue Perfusion: Goal: Adequacy of tissue perfusion will improve Outcome: Adequate for Discharge   Problem: Education: Goal: Knowledge of General Education information will improve Description: Including pain rating scale, medication(s)/side effects and non-pharmacologic comfort measures Outcome: Adequate for Discharge   Problem: Health Behavior/Discharge Planning: Goal: Ability to manage health-related needs will improve Outcome: Adequate for Discharge   Problem: Clinical Measurements: Goal: Ability to maintain clinical measurements within normal limits will improve Outcome: Adequate for Discharge Goal: Will remain free from infection Outcome: Adequate for Discharge Goal: Diagnostic test results will improve Outcome: Adequate for Discharge Goal: Respiratory complications will improve Outcome: Adequate for Discharge Goal: Cardiovascular complication will be avoided Outcome: Adequate for Discharge   Problem: Activity: Goal: Risk for activity intolerance will decrease Outcome: Adequate for Discharge   Problem: Nutrition: Goal: Adequate nutrition will be maintained Outcome: Adequate for Discharge   Problem: Coping: Goal: Level of  anxiety will decrease Outcome: Adequate for Discharge   Problem: Elimination: Goal: Will not experience complications related to bowel motility Outcome: Adequate for Discharge Goal: Will not experience complications related to urinary retention Outcome: Adequate for Discharge   Problem: Pain Managment: Goal: General experience of comfort will improve and/or be controlled Outcome: Adequate for Discharge   Problem: Safety: Goal: Ability to remain free from injury will improve Outcome: Adequate for Discharge   Problem: Skin Integrity: Goal: Risk for impaired skin integrity will decrease Outcome: Adequate for Discharge

## 2024-02-17 ENCOUNTER — Inpatient Hospital Stay
Admit: 2024-02-17 | Discharge: 2024-02-17 | Disposition: A | Discharge status: Home / Self Care | Payer: Medicare (Managed Care) | Arrived: AM

## 2024-02-17 LAB — URINALYSIS
Bilirubin, Urine: NEGATIVE
Glucose, Ur: NEGATIVE mg/dL
Ketones, Urine: 15 mg/dL — AB
Nitrite, Urine: NEGATIVE
Protein, UA: 300 mg/dL — AB
Specific Gravity, UA: 1.027 (ref 1.005–1.030)
Urobilinogen, Urine: 1 EU/dL (ref 0.2–1.0)
pH, Urine: 6.5 (ref 5.0–8.0)

## 2024-02-17 LAB — COMPREHENSIVE METABOLIC PANEL
ALT: 8 U/L — ABNORMAL LOW (ref 10–35)
AST: 16 U/L (ref 10–38)
Albumin/Globulin Ratio: 0.4 — ABNORMAL LOW (ref 0.8–1.7)
Albumin: 2 g/dL — ABNORMAL LOW (ref 3.4–5.0)
Alk Phosphatase: 60 U/L (ref 45–117)
Anion Gap: 11 mmol/L (ref 3–18)
BUN/Creatinine Ratio: 16 (ref 12–20)
BUN: 10 mg/dL (ref 6–23)
CO2: 30 mmol/L (ref 21–32)
Calcium: 9.6 mg/dL (ref 8.6–10.2)
Chloride: 96 mmol/L — ABNORMAL LOW (ref 98–107)
Creatinine: 0.64 mg/dL (ref 0.60–1.30)
Est, Glom Filt Rate: >90 ml/min/1.73m2 (ref >60)
Globulin: 5 g/dL — ABNORMAL HIGH (ref 2.0–4.0)
Glucose: 122 mg/dL — ABNORMAL HIGH (ref 74–108)
Potassium: 3.4 mmol/L — ABNORMAL LOW (ref 3.5–5.5)
Sodium: 137 mmol/L (ref 136–145)
Total Bilirubin: 0.3 mg/dL (ref 0.2–1.0)
Total Protein: 7 g/dL (ref 6.4–8.2)

## 2024-02-17 LAB — CBC WITH AUTO DIFFERENTIAL
Basophils %: 0.2 % (ref 0.0–2.0)
Basophils Absolute: 0.03 K/UL (ref 0.00–0.10)
Eosinophils %: 0.2 % (ref 0.0–5.0)
Eosinophils Absolute: 0.02 K/UL (ref 0.00–0.40)
Hematocrit: 31 % — ABNORMAL LOW (ref 35.0–45.0)
Hemoglobin: 9.8 g/dL — ABNORMAL LOW (ref 12.0–16.0)
Immature Granulocytes %: 3.6 % — ABNORMAL HIGH (ref 0.0–0.5)
Immature Granulocytes Absolute: 0.48 K/UL — ABNORMAL HIGH (ref 0.00–0.04)
Lymphocytes %: 8 % — ABNORMAL LOW (ref 21.0–52.0)
Lymphocytes Absolute: 1.06 K/UL (ref 0.90–3.30)
MCH: 27.4 pg (ref 24.0–34.0)
MCHC: 31.6 g/dL (ref 31.0–37.0)
MCV: 86.6 FL (ref 78.0–100.0)
MPV: 9.7 FL (ref 9.2–11.8)
Monocytes %: 4.1 % (ref 3.0–10.0)
Monocytes Absolute: 0.54 K/UL (ref 0.05–1.20)
Neutrophils %: 83.9 % — ABNORMAL HIGH (ref 40.0–73.0)
Neutrophils Absolute: 11.09 K/UL — ABNORMAL HIGH (ref 1.80–8.00)
Nucleated RBCs: 0 /100{WBCs}
Platelets: 385 K/uL (ref 135–420)
RBC: 3.58 M/uL — ABNORMAL LOW (ref 4.20–5.30)
RDW: 14.7 % — ABNORMAL HIGH (ref 11.6–14.5)
WBC: 13.2 K/uL (ref 4.6–13.2)
nRBC: 0 K/uL (ref 0.00–0.01)

## 2024-02-17 LAB — URINALYSIS, MICRO

## 2024-02-17 MED ORDER — LORAZEPAM 1 MG PO TABS
1 | Freq: Once | ORAL | Status: AC
Start: 2024-02-17 — End: 2024-02-17
  Administered 2024-02-17: 06:00:00 2 mg via ORAL

## 2024-02-17 MED ORDER — SODIUM CHLORIDE 0.9 % IV BOLUS
0.9 | Freq: Once | INTRAVENOUS | Status: AC
Start: 2024-02-17 — End: 2024-02-17
  Administered 2024-02-17: 05:00:00 1000 mL via INTRAVENOUS

## 2024-02-17 MED ORDER — HYDROMORPHONE HCL PF 1 MG/ML IJ SOLN
1 | INTRAMUSCULAR | Status: AC
Start: 2024-02-17 — End: 2024-02-17
  Administered 2024-02-17: 05:00:00 1 mg via INTRAVENOUS

## 2024-02-17 MED ORDER — ONDANSETRON 4 MG PO TBDP
4 | ORAL_TABLET | Freq: Three times a day (TID) | ORAL | 1 refills | 7.00000 days | Status: AC | PRN
Start: 2024-02-17 — End: ?

## 2024-02-17 MED ORDER — DIPHENHYDRAMINE HCL 50 MG/ML IJ SOLN
50 | INTRAMUSCULAR | Status: DC
Start: 2024-02-17 — End: 2024-02-17

## 2024-02-17 MED FILL — SODIUM CHLORIDE 0.9 % IV SOLN: 0.9 % | INTRAVENOUS | Qty: 1000 | Fill #0

## 2024-02-17 MED FILL — DIPHENHYDRAMINE HCL 50 MG/ML IJ SOLN: 50 mg/mL | INTRAMUSCULAR | Qty: 1 | Fill #0

## 2024-02-17 MED FILL — LORAZEPAM 1 MG PO TABS: 1 mg | ORAL | Qty: 2 | Fill #0

## 2024-02-17 MED FILL — HYDROMORPHONE HCL 1 MG/ML IJ SOLN: 1 mg/mL | INTRAMUSCULAR | Qty: 1 | Fill #0

## 2024-02-17 NOTE — Discharge Instructions (Signed)
"  I have placed a referral for our palliative care team.  I have prescribed zofran  to use as needed.  "

## 2024-02-18 LAB — CULTURE, URINE: Culture: NO GROWTH

## 2024-02-19 NOTE — Telephone Encounter (Signed)
"  Received voicemail from patient daughter. Return call. Patient daughter stated patient is now enrolled in hospice and does not need outpatient palliative medicine.     Sari Faden, RN  Outpatient Palliative Medicine  [phone]   "

## 2024-02-21 ENCOUNTER — Telehealth: Payer: Self-pay

## 2024-02-21 NOTE — Telephone Encounter (Signed)
 Patient's daughter was called and left message to return call.     Dwayne Chute, LPN

## 2024-02-21 NOTE — Telephone Encounter (Signed)
 [facility] transferred patient to [facility] ([place] ). A hospice order is required. Camie reported that Dr. Lonn was the last provider to see the patient and requested that an order be placed from oncology at [facility]. Patient is currently located in [place] .

## 2024-02-23 NOTE — Telephone Encounter (Signed)
 Attempted to call [facility] to see what is needed. No answer and unable to leave a message.

## 2024-02-28 ENCOUNTER — Emergency Department: Admit: 2024-02-29 | Payer: Medicare (Managed Care)

## 2024-02-28 DIAGNOSIS — K5900 Constipation, unspecified: Principal | ICD-10-CM

## 2024-02-28 NOTE — Discharge Instructions (Signed)
"  Continue your MiraLAX  and Senokot.  Take the magnesium  citrate.  Take the prescribed antibiotics for the urinary tract infection.  Follow-up with your doctor.  Return to the emergency department for any new or worsening symptoms  "

## 2024-02-28 NOTE — ED Triage Notes (Signed)
"  Pt arrived via EMS reporting abd pain and decreasing BM and gas relief. Pt states she hasn't had a BM in the past week but was able to have yesterday with assistance from her daughter physically pulling it out.     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 Pt is bed ridden.  "

## 2024-02-28 NOTE — ED Notes (Addendum)
 Paperwork read with patient making note of where to pick up prescriptions (if applicable).     IV taken out (if applicable).     Armband removed and disposed of in shredder.     Patient has no further questions at this time.     Will discharge from system.

## 2024-02-28 NOTE — ED Notes (Signed)
"  Patient denied IV and blood work to be done. Notified MD.   "

## 2024-02-28 NOTE — ED Provider Notes (Signed)
 " EMERGENCY DEPARTMENT HISTORY AND PHYSICAL EXAM      Date: 02/28/2024  Patient Name: Erica Zamora    History of Presenting Illness     Chief Complaint   Patient presents with    Abdominal Pain    Constipation       63 year old female with history of metastatic disease on palliative care, bedbound at baseline presenting to the emergency department by EMS for evaluation of constipation and abdominal pain.  The patient states that she has been having issues with bowel movements last week.  States that her daughter had to digitally disimpact her yesterday.              PCP: Jarold Register, APRN - NP    Current Facility-Administered Medications   Medication Dose Route Frequency Provider Last Rate Last Admin    HYDROmorphone  (DILAUDID ) tablet 4 mg  4 mg Oral NOW Sherril Prentice POUR, DO         Current Outpatient Medications   Medication Sig Dispense Refill    cephALEXin  (KEFLEX ) 500 MG capsule Take 1 capsule by mouth 2 times daily for 7 days 14 capsule 0    Magnesium  Citrate 1.745 GM/30ML solution Take 296 mLs by mouth once for 1 dose 296 mL 0    ondansetron  (ZOFRAN -ODT) 4 MG disintegrating tablet Take 1 tablet by mouth 3 times daily as needed for Nausea or Vomiting 30 tablet 1    methylPREDNISolone  (MEDROL  DOSEPACK) 4 MG tablet Take with food. 1 kit 0    gabapentin (NEURONTIN) 300 MG capsule Take 1 capsule by mouth nightly. Max Daily Amount: 300 mg      ALPRAZolam (XANAX) 0.25 MG tablet ceived the following from [facility]: Outside name: ALPRAZolam (XANAX) 0.25 mg tablet      cetirizine (ZYRTEC) 10 MG tablet Take 1 tablet by mouth daily      fluticasone (FLONASE) 50 MCG/ACT nasal spray 1 spray daily as needed      HYDROmorphone  (DILAUDID ) 4 MG tablet Take 4 mg by mouth every 6 hours as needed.      hydrOXYzine HCl (ATARAX) 50 MG tablet Take 50 mg by mouth 3 times daily as needed      levocetirizine (XYZAL) 5 MG tablet Take 5 mg by mouth daily      lidocaine  (LIDODERM ) 5 % ceived the following from  [facility]: Outside name: lidocaine  (LIDODERM ) 5 %      lisinopril-hydroCHLOROthiazide (PRINZIDE;ZESTORETIC) 10-12.5 MG per tablet Take by mouth daily      methadone 10 MG/5ML solution Take 70 mg by mouth daily.      omeprazole (PRILOSEC) 20 MG delayed release capsule ceived the following from [facility]: Outside name: omeprazole (PRILOSEC) 20 mg capsule      pravastatin (PRAVACHOL) 10 MG tablet ceived the following from [facility]: Outside name: pravastatin (PRAVACHOL) 10 mg tablet         Past History     Past Medical History:  Past Medical History:   Diagnosis Date    Arthritis     Essential hypertension        Past Surgical History:  Past Surgical History:   Procedure Laterality Date    CT NEEDLE BIOPSY SPINE  04/27/2023    CT NEEDLE BIOPSY SPINE 04/27/2023       Family History:  No family history on file.    Social History:  Social History     Tobacco  Use    Smoking status: Every Day     Current packs/day: 0.50     Types: Cigarettes    Smokeless tobacco: Never   Substance Use Topics    Alcohol use: Not Currently    Drug use: Not Currently       Allergies:  Allergies   Allergen Reactions    Fentanyl Itching, Other (See Comments) and Swelling     Burning    Tramadol Nausea Only         Review of Systems       Review of Systems   Constitutional:  Negative for activity change, fatigue and fever.   Respiratory:  Negative for chest tightness and shortness of breath.    Cardiovascular:  Negative for chest pain.   Gastrointestinal:  Positive for abdominal pain and constipation. Negative for diarrhea, nausea and vomiting.   Musculoskeletal:  Negative for arthralgias and myalgias.   Skin:  Negative for rash and wound.   Neurological:  Negative for dizziness, weakness, light-headedness, numbness and headaches.   Psychiatric/Behavioral:  Negative for agitation.          Physical Exam   BP 133/61   Pulse 97   Temp 98.2 F (36.8 C)   Resp 16   SpO2 100%       Physical  Exam  Constitutional:       General: She is not in acute distress.     Appearance: She is not ill-appearing.   HENT:      Head: Normocephalic and atraumatic.      Mouth/Throat:      Mouth: Mucous membranes are moist.   Eyes:      Extraocular Movements: Extraocular movements intact.      Pupils: Pupils are equal, round, and reactive to light.   Cardiovascular:      Rate and Rhythm: Normal rate and regular rhythm.   Pulmonary:      Effort: Pulmonary effort is normal.      Breath sounds: Normal breath sounds.   Abdominal:      General: Abdomen is flat.      Palpations: Abdomen is soft.      Tenderness: There is abdominal tenderness in the left upper quadrant and left lower quadrant. There is no guarding or rebound. Negative signs include Murphy's sign and Rovsing's sign.      Hernia: No hernia is present.   Musculoskeletal:         General: No swelling or deformity. Normal range of motion.      Cervical back: Normal range of motion and neck supple.   Skin:     General: Skin is warm and dry.      Capillary Refill: Capillary refill takes less than 2 seconds.   Neurological:      General: No focal deficit present.      Mental Status: She is alert and oriented to person, place, and time.      Cranial Nerves: No cranial nerve deficit.      Sensory: No sensory deficit.      Motor: No weakness.   Psychiatric:         Mood and Affect: Mood normal.           Diagnostic Study Results     Labs -  Recent Results (from the past 12 hours)   Urinalysis    Collection Time: 02/28/24  8:58 PM   Result Value Ref Range    Color, UA YELLOW  Appearance TURBID      Specific Gravity, UA 1.013 1.005 - 1.030      pH, Urine 6.5 5.0 - 8.0      Protein, UA 300 (A) NEG mg/dL    Glucose, Ur Negative NEG mg/dL    Ketones, Urine Negative NEG mg/dL    Bilirubin, Urine Negative NEG      Blood, Urine MODERATE (A) NEG      Urobilinogen, Urine 0.2 0.2 - 1.0 EU/dL    Nitrite, Urine Positive (A) NEG      Leukocyte Esterase, Urine LARGE (A) NEG      Urinalysis, Micro    Collection Time: 02/28/24  8:58 PM   Result Value Ref Range    WBC, UA TOO NUMEROUS TO COUNT 0 - 5 /hpf    RBC, UA 11-20 0 - 5 /hpf    Epithelial Cells, UA FEW 0 - 5 /lpf    BACTERIA, URINE 4+ (A) NEG /hpf       Radiologic Studies -   Non-plain film images such as CT, Ultrasound and MRI are read by the radiologist. Plain radiographic images are visualized and preliminarily interpreted by the emergency physician.    XR ABDOMEN (KUB) (SINGLE AP VIEW)   Final Result   There is a moderate volume of stool in the sigmoid and a small   volume of stool elsewhere in the colon. The small bowel gas pattern is normal.      Age-indeterminate right pubic fractures may be pathologic. Bilateral sacral   insufficiency fractures.      The findings were conveyed via secure electronic text message [facility]) to   Dr. Prentice Deck on 02/28/2024 9:21 PM by Dr. Medford Manners.  [phone]         Electronically signed by Medford Manners              Medical Decision Making   I am the first provider for this patient.    I reviewed the vital signs, available nursing notes, past medical history, past surgical history, family history and social history.      Vital Signs-Reviewed the patient's vital signs.    EKG: All EKG's are interpreted by the Emergency Department Physician who either signs or Co-signs this chart in the absence of a cardiologist.               Interpretation per the Radiologist below, if available at the time of this note:    ED Course: Progress Notes, Reevaluation, and Consults:    Provider Notes (Medical Decision Making):       MDM  Number of Diagnoses or Management Options  Constipation, unspecified constipation type  Urinary tract infection without hematuria, site unspecified  Diagnosis management comments: Patient appears in no acute distress.  Abdomen is soft nontender on my exam.  No distention.  Patient's KUB demonstrating moderate volume of stool in the sigmoid colon but small throughout the  colon otherwise.  No stool in the rectal vault.  No obstructive bowel gas pattern.  UA does demonstrate urinary tract infection.  Attempted lab work but after multiple attempts placing a peripheral IV, patient declined any further attempts.  Offered patient an enema but she declined at this time.  Will discharge home with magnesium  citrate in addition to stool softeners and MiraLAX  at home.  Will start her on antibiotics for UTI.  Follow-up and return precautions advised.  Patient verbalized understanding and agreement with plan.  Stable for discharge.  Procedures          Diagnosis     Clinical Impression:   1. Urinary tract infection without hematuria, site unspecified    2. Constipation, unspecified constipation type        Disposition: discharged    Disclaimer: Sections of this note are dictated using utilizing voice recognition software.  Minor typographical errors may be present. If questions arise, please do not hesitate to contact me or call our department.         Sherril Prentice POUR, DO  03/06/24 [id number]    "

## 2024-02-29 ENCOUNTER — Inpatient Hospital Stay
Admit: 2024-02-29 | Discharge: 2024-02-29 | Disposition: A | Discharge status: Home / Self Care | Payer: Medicare (Managed Care) | Arrived: AM | Attending: Emergency Medicine

## 2024-02-29 LAB — URINALYSIS, MICRO

## 2024-02-29 LAB — URINALYSIS
Bilirubin, Urine: NEGATIVE
Glucose, Ur: NEGATIVE mg/dL
Ketones, Urine: NEGATIVE mg/dL
Nitrite, Urine: POSITIVE — AB
Protein, UA: 300 mg/dL — AB
Specific Gravity, UA: 1.013 (ref 1.005–1.030)
Urobilinogen, Urine: 0.2 EU/dL (ref 0.2–1.0)
pH, Urine: 6.5 (ref 5.0–8.0)

## 2024-02-29 MED ORDER — CEPHALEXIN 500 MG PO CAPS
500 | ORAL_CAPSULE | Freq: Two times a day (BID) | ORAL | 0 refills | 7.00000 days | Status: AC
Start: 2024-02-29 — End: 2024-03-06

## 2024-02-29 MED ORDER — HYDROMORPHONE HCL 2 MG PO TABS
2 | ORAL | Status: AC
Start: 2024-02-29 — End: 2024-02-28
  Administered 2024-02-29: 03:00:00 4 mg via ORAL

## 2024-02-29 MED ORDER — LIDOCAINE HCL 1% INJ (MIXTURES ONLY)
1 | INTRAMUSCULAR | Status: AC
Start: 2024-02-29 — End: 2024-02-28
  Administered 2024-02-29: 03:00:00 1000 mg via INTRAMUSCULAR

## 2024-02-29 MED ORDER — MAGNESIUM CITRATE 1.745 GM/30ML PO SOLN
1.745 | Freq: Once | ORAL | 0 refills | 1.00000 days | Status: AC
Start: 2024-02-29 — End: 2024-02-28

## 2024-02-29 MED FILL — HYDROMORPHONE HCL 2 MG PO TABS: 2 mg | ORAL | Qty: 2 | Fill #0

## 2024-02-29 MED FILL — CEFTRIAXONE SODIUM 1 G IJ SOLR: 1 g | INTRAMUSCULAR | Qty: 1000 | Fill #0

## 2024-03-04 ENCOUNTER — Emergency Department: Admit: 2024-03-04 | Payer: Medicare (Managed Care)

## 2024-03-04 ENCOUNTER — Inpatient Hospital Stay
Admit: 2024-03-04 | Discharge: 2024-03-05 | Disposition: A | Discharge status: Home / Self Care | Payer: Medicare (Managed Care) | Arrived: AM | Attending: Emergency Medicine

## 2024-03-04 DIAGNOSIS — K5900 Constipation, unspecified: Secondary | ICD-10-CM

## 2024-03-04 LAB — BASIC METABOLIC PANEL
Anion Gap: 9 mmol/L (ref 3–18)
BUN/Creatinine Ratio: 25 — ABNORMAL HIGH (ref 12–20)
BUN: 19 mg/dL (ref 6–23)
CO2: 32 mmol/L (ref 21–32)
Calcium: 9.8 mg/dL (ref 8.6–10.2)
Chloride: 98 mmol/L (ref 98–107)
Creatinine: 0.75 mg/dL (ref 0.60–1.30)
Est, Glom Filt Rate: 89 ml/min/1.73m2 (ref >60)
Glucose: 81 mg/dL (ref 74–108)
Potassium: 3.5 mmol/L (ref 3.5–5.5)
Sodium: 139 mmol/L (ref 136–145)

## 2024-03-04 LAB — CBC WITH AUTO DIFFERENTIAL
Basophils %: 0.3 % (ref 0.0–2.0)
Basophils Absolute: 0.03 K/UL (ref 0.00–0.10)
Eosinophils %: 1.1 % (ref 0.0–5.0)
Eosinophils Absolute: 0.11 K/UL (ref 0.00–0.40)
Hematocrit: 35.4 % (ref 35.0–45.0)
Hemoglobin: 10.9 g/dL — ABNORMAL LOW (ref 12.0–16.0)
Immature Granulocytes %: 1.9 % — ABNORMAL HIGH (ref 0.0–0.5)
Immature Granulocytes Absolute: 0.19 K/UL — ABNORMAL HIGH (ref 0.00–0.04)
Lymphocytes %: 17.6 % — ABNORMAL LOW (ref 21.0–52.0)
Lymphocytes Absolute: 1.8 K/UL (ref 0.90–3.30)
MCH: 27.5 pg (ref 24.0–34.0)
MCHC: 30.8 g/dL — ABNORMAL LOW (ref 31.0–37.0)
MCV: 89.2 FL (ref 78.0–100.0)
MPV: 8.8 FL — ABNORMAL LOW (ref 9.2–11.8)
Monocytes %: 4.5 % (ref 3.0–10.0)
Monocytes Absolute: 0.46 K/UL (ref 0.05–1.20)
Neutrophils %: 74.6 % — ABNORMAL HIGH (ref 40.0–73.0)
Neutrophils Absolute: 7.61 K/UL (ref 1.80–8.00)
Nucleated RBCs: 0 /100{WBCs}
Platelets: 307 K/uL (ref 135–420)
RBC: 3.97 M/uL — ABNORMAL LOW (ref 4.20–5.30)
RDW: 15.6 % — ABNORMAL HIGH (ref 11.6–14.5)
WBC: 10.2 K/uL (ref 4.6–13.2)
nRBC: 0 K/uL (ref 0.00–0.01)

## 2024-03-04 MED ORDER — SODIUM CHLORIDE 0.9 % IV BOLUS
0.9 | Freq: Once | INTRAVENOUS | Status: AC
Start: 2024-03-04 — End: 2024-03-04
  Administered 2024-03-04: 22:00:00 500 mL via INTRAVENOUS

## 2024-03-04 MED ORDER — IOPAMIDOL 61 % IV SOLN
61 | Freq: Once | INTRAVENOUS | Status: AC | PRN
Start: 2024-03-04 — End: 2024-03-04
  Administered 2024-03-04: 21:00:00 100 mL via INTRAVENOUS

## 2024-03-04 MED ORDER — MORPHINE SULFATE (PF) 2 MG/ML IV SOLN
2 | INTRAVENOUS | Status: AC
Start: 2024-03-04 — End: 2024-03-04
  Administered 2024-03-04: 22:00:00 4 mg via INTRAVENOUS

## 2024-03-04 MED ORDER — ONDANSETRON HCL 4 MG/2ML IJ SOLN
4 | Freq: Once | INTRAMUSCULAR | Status: AC
Start: 2024-03-04 — End: 2024-03-04
  Administered 2024-03-04: 22:00:00 4 mg via INTRAVENOUS

## 2024-03-04 MED ORDER — POLYETHYLENE GLYCOL 3350 17 G PO PACK
17 | Freq: Two times a day (BID) | ORAL | 1 refills | Status: AC
Start: 2024-03-04 — End: 2024-04-03

## 2024-03-04 MED ORDER — KETOROLAC TROMETHAMINE 15 MG/ML IJ SOLN
15 | Freq: Once | INTRAMUSCULAR | Status: AC
Start: 2024-03-04 — End: 2024-03-04
  Administered 2024-03-04: 22:00:00 15 mg via INTRAVENOUS

## 2024-03-04 MED FILL — KETOROLAC TROMETHAMINE 15 MG/ML IJ SOLN: 15 mg/mL | INTRAMUSCULAR | Qty: 1

## 2024-03-04 MED FILL — MORPHINE SULFATE 2 MG/ML IJ SOLN: 2 mg/mL | INTRAMUSCULAR | Qty: 2

## 2024-03-04 MED FILL — SODIUM CHLORIDE 0.9 % IV SOLN: 0.9 % | INTRAVENOUS | Qty: 500

## 2024-03-04 MED FILL — ONDANSETRON HCL 4 MG/2ML IJ SOLN: 4 MG/2ML | INTRAMUSCULAR | Qty: 2

## 2024-03-04 MED FILL — ISOVUE-300 61 % IV SOLN: 61 % | INTRAVENOUS | Qty: 100

## 2024-03-04 NOTE — ED Provider Notes (Signed)
 "EMERGENCY DEPARTMENT HISTORY AND PHYSICAL EXAM    3:22 PM EST seen at this time in room 5        Date: 03/04/2024  Patient Name: Erica Zamora    History of Presenting Illness     Chief Complaint   Patient presents with    Leg Pain    Constipation         History Provided By: patient    Additional History (Context): MINAH AXELROD is a 63 y.o. female presents with history of spinal cancer, nonambulatory since April or May, ongoing bilateral leg pain, neurogenic bladder, complains of 1 week of no bowel movement and pain in her rectum suspects there is a stool ball there.  No vomiting.  Rates her pain as severe.SABRA    PCP: None, None    Chief Complaint:   Duration:    Timing:    Location:   Quality:   Severity:   Modifying Factors:   Associated Symptoms:       No current facility-administered medications for this encounter.     Current Outpatient Medications   Medication Sig Dispense Refill    polyethylene glycol (MIRALAX ) 17 g packet Take 1 packet by mouth 2 times daily 527 g 1    cephALEXin  (KEFLEX ) 500 MG capsule Take 1 capsule by mouth 2 times daily for 7 days 14 capsule 0    ondansetron  (ZOFRAN -ODT) 4 MG disintegrating tablet Take 1 tablet by mouth 3 times daily as needed for Nausea or Vomiting 30 tablet 1    methylPREDNISolone  (MEDROL  DOSEPACK) 4 MG tablet Take with food. 1 kit 0    gabapentin (NEURONTIN) 300 MG capsule Take 1 capsule by mouth nightly. Max Daily Amount: 300 mg      ALPRAZolam (XANAX) 0.25 MG tablet ceived the following from [facility]: Outside name: ALPRAZolam (XANAX) 0.25 mg tablet      cetirizine (ZYRTEC) 10 MG tablet Take 1 tablet by mouth daily      fluticasone (FLONASE) 50 MCG/ACT nasal spray 1 spray daily as needed      HYDROmorphone  (DILAUDID ) 4 MG tablet Take 4 mg by mouth every 6 hours as needed.      hydrOXYzine HCl (ATARAX) 50 MG tablet Take 50 mg by mouth 3 times daily as needed      levocetirizine (XYZAL) 5 MG tablet Take 5 mg by mouth daily      lidocaine   (LIDODERM ) 5 % ceived the following from [facility]: Outside name: lidocaine  (LIDODERM ) 5 %      lisinopril-hydroCHLOROthiazide (PRINZIDE;ZESTORETIC) 10-12.5 MG per tablet Take by mouth daily      methadone 10 MG/5ML solution Take 70 mg by mouth daily.      omeprazole (PRILOSEC) 20 MG delayed release capsule ceived the following from [facility]: Outside name: omeprazole (PRILOSEC) 20 mg capsule      pravastatin (PRAVACHOL) 10 MG tablet ceived the following from [facility]: Outside name: pravastatin (PRAVACHOL) 10 mg tablet         Past History     Past Medical History:  Past Medical History:   Diagnosis Date    Arthritis     Essential hypertension        Past Surgical History:  Past Surgical History:   Procedure Laterality Date    CT NEEDLE BIOPSY SPINE  04/27/2023    CT NEEDLE BIOPSY SPINE 04/27/2023       Family History:  No family history on file.    Social History:  Social History     Tobacco Use    Smoking status: Every Day     Current packs/day: 0.50     Types: Cigarettes    Smokeless tobacco: Never   Substance Use Topics    Alcohol use: Not Currently    Drug use: Not Currently       Allergies:  Allergies   Allergen Reactions    Fentanyl Itching, Other (See Comments) and Swelling     Burning    Tramadol Nausea Only         Review of Systems     Review of Systems      Physical Exam       Patient Vitals for the past 12 hrs:   Temp Pulse Resp BP SpO2   03/04/24 1654 -- -- -- (!) 112/52 99 %   03/04/24 1645 -- -- -- -- 97 %   03/04/24 1630 -- -- -- -- 95 %   03/04/24 1600 -- -- -- (!) 123/58 97 %   03/04/24 1545 -- -- -- (!) 109/55 94 %   03/04/24 1437 97.7 F (36.5 C) 97 18 (!) 149/72 97 %       IPVITALS  Patient Vitals for the past 24 hrs:   BP Temp Temp src Pulse Resp SpO2 Height Weight   03/04/24 1654 (!) 112/52 -- -- -- -- 99 % -- --   03/04/24 1645 -- -- -- -- -- 97 % -- --   03/04/24 1630 -- -- -- -- -- 95 % -- --   03/04/24 1600 (!) 123/58 -- -- -- -- 97 %  -- --   03/04/24 1545 (!) 109/55 -- -- -- -- 94 % -- --   03/04/24 1437 (!) 149/72 97.7 F (36.5 C) Oral 97 18 97 % 1.549 m (5' 1) 68 kg (150 lb)       Physical Exam  Constitutional:       General: She is in acute distress (Moderate discomfort).      Appearance: Normal appearance.   HENT:      Head: Normocephalic and atraumatic.   Cardiovascular:      Rate and Rhythm: Normal rate and regular rhythm.      Pulses: Normal pulses.   Pulmonary:      Effort: Pulmonary effort is normal.      Breath sounds: Normal breath sounds.   Abdominal:      Tenderness: There is no abdominal tenderness.   Musculoskeletal:         General: Normal range of motion.      Cervical back: Normal range of motion and neck supple.      Right lower leg: Edema present.      Left lower leg: No edema.   Skin:     General: Skin is warm and dry.   Neurological:      General: No focal deficit present.      Mental Status: She is alert.           Diagnostic Study Results   Labs -  Recent Results (from the past 24 hours)   CBC with Auto Differential    Collection Time: 03/04/24  2:42 PM   Result Value Ref Range    WBC 10.2 4.6 - 13.2 K/uL    RBC 3.97 (L) 4.20 - 5.30 M/uL    Hemoglobin 10.9 (L) 12.0 - 16.0 g/dL    Hematocrit 64.5 64.9 -  45.0 %    MCV 89.2 78.0 - 100.0 FL    MCH 27.5 24.0 - 34.0 PG    MCHC 30.8 (L) 31.0 - 37.0 g/dL    RDW 84.3 (H) 88.3 - 14.5 %    Platelets 307 135 - 420 K/uL    MPV 8.8 (L) 9.2 - 11.8 FL    Nucleated RBCs 0.0 0 PER 100 WBC    nRBC 0.00 0.00 - 0.01 K/uL    Neutrophils % 74.6 (H) 40.0 - 73.0 %    Lymphocytes % 17.6 (L) 21.0 - 52.0 %    Monocytes % 4.5 3.0 - 10.0 %    Eosinophils % 1.1 0.0 - 5.0 %    Basophils % 0.3 0.0 - 2.0 %    Immature Granulocytes % 1.9 (H) 0.0 - 0.5 %    Neutrophils Absolute 7.61 1.80 - 8.00 K/UL    Lymphocytes Absolute 1.80 0.90 - 3.30 K/UL    Monocytes Absolute 0.46 0.05 - 1.20 K/UL    Eosinophils Absolute 0.11 0.00 - 0.40 K/UL    Basophils Absolute 0.03 0.00 - 0.10 K/UL    Immature Granulocytes  Absolute 0.19 (H) 0.00 - 0.04 K/UL    Differential Type AUTOMATED     BMP    Collection Time: 03/04/24  2:42 PM   Result Value Ref Range    Sodium 139 136 - 145 mmol/L    Potassium 3.5 3.5 - 5.5 mmol/L    Chloride 98 98 - 107 mmol/L    CO2 32 21 - 32 mmol/L    Anion Gap 9 3 - 18 mmol/L    Glucose 81 74 - 108 mg/dL    BUN 19 6 - 23 MG/DL    Creatinine 9.24 9.39 - 1.30 MG/DL    BUN/Creatinine Ratio 25 (H) 12 - 20      Est, Glom Filt Rate 89 >60 ml/min/1.41m2    Calcium 9.8 8.6 - 10.2 MG/DL       Radiologic Studies -   CT ABDOMEN PELVIS W IV CONTRAST Additional Contrast? None   Final Result   Sacral fracture with invasion of multiple structures as described and associated   right hydronephrosis and hydroureter.   Air-fluid level in the bladder could be secondary to a fistula or recent   catheterization   Constipation      Electronically signed by Devere Cal        @[email or web address] @    Medications ordered:   Medications   sodium chloride  0.9 % bolus 500 mL (500 mLs IntraVENous New Bag 03/04/24 1653)   ketorolac  (TORADOL ) injection 15 mg (15 mg IntraVENous Given 03/04/24 1655)   morphine  (PF) injection 4 mg (4 mg IntraVENous Given 03/04/24 1654)   ondansetron  (ZOFRAN ) injection 4 mg (4 mg IntraVENous Given 03/04/24 1655)   iopamidol  (ISOVUE -300) 61 % IntraVENous 100 mL (100 mLs IntraVENous Given 03/04/24 1617)              Medical Decision Making   Initial Medical Decision Making and DDx:  Evaluate for bowel obstruction impaction change in status with spinal cancer.  Do not suspect sepsis.  Impaction is most likely    ED Course: Progress Notes, Reevaluation, and Consults:  ED Course as of 03/04/24 1849   Mon Mar 04, 2024   1843 Nurse Brittany as chaperone, patient in left lateral recumbent position, large amount of brown benign-appearing firm stool removed from the rectal vault.  Patient tolerated the procedure well without complication.  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  No new finding.  Will start MiraLAX  for her constipation. [CB]      ED Course User Index  [CB] Bianca Carlin FERNS, MD         I am the first provider for this patient.    I reviewed the vital signs, available nursing notes, past medical history, past surgical history, family history and social history.    Patient Vitals for the past 12 hrs:   Temp Pulse Resp BP SpO2   03/04/24 1654 -- -- -- (!) 112/52 99 %   03/04/24 1645 -- -- -- -- 97 %   03/04/24 1630 -- -- -- -- 95 %   03/04/24 1600 -- -- -- (!) 123/58 97 %   03/04/24 1545 -- -- -- (!) 109/55 94 %   03/04/24 1437 97.7 F (36.5 C) 97 18 (!) 149/72 97 %       Vital Signs-Reviewed the patient's vital signs.    Pulse Oximetry Analysis, Cardiac Monitor, 12 lead ekg:      Interpreted by the EP.      Records Reviewed: Nursing notes reviewed (Time of Review: 6:49 PM)    Procedures:   Critical Care Time: 0  If critical care time is note it is exclusive of any separately billable procedures.     Aspirin: (was aspirin given for stroke?)    Diagnosis     Disposition: DISPOSITION Decision To Discharge 03/04/2024 06:48:30 PM   DISPOSITION CONDITION Stable            DISCHARGE MEDICATIONS:  Current Discharge Medication List             Details   polyethylene glycol (MIRALAX ) 17 g packet Take 1 packet by mouth 2 times daily  Qty: 527 g, Refills: 1                Details   cephALEXin  (KEFLEX ) 500 MG capsule Take 1 capsule by mouth 2 times daily for 7 days  Qty: 14 capsule, Refills: 0      ondansetron  (ZOFRAN -ODT) 4 MG disintegrating tablet Take 1 tablet by mouth 3 times daily as needed for Nausea or Vomiting  Qty: 30 tablet, Refills: 1      methylPREDNISolone  (MEDROL  DOSEPACK) 4 MG tablet Take with food.  Qty: 1 kit, Refills: 0      gabapentin (NEURONTIN) 300 MG capsule Take 1 capsule by mouth nightly. Max Daily Amount: 300 mg      ALPRAZolam (XANAX) 0.25 MG tablet ceived the following from [facility]: Outside name: ALPRAZolam (XANAX) 0.25 mg tablet      cetirizine (ZYRTEC) 10 MG tablet Take 1 tablet by mouth daily      fluticasone (FLONASE) 50 MCG/ACT nasal spray 1 spray daily as needed      HYDROmorphone  (DILAUDID ) 4 MG tablet Take 4 mg by mouth every 6 hours as needed.      hydrOXYzine HCl (ATARAX) 50 MG tablet Take 50 mg by mouth 3 times daily as needed      levocetirizine (XYZAL) 5 MG tablet Take 5 mg by mouth daily      lidocaine  (LIDODERM ) 5 % ceived the following from [facility]: Outside name: lidocaine  (LIDODERM ) 5 %      lisinopril-hydroCHLOROthiazide (PRINZIDE;ZESTORETIC) 10-12.5 MG per tablet Take by mouth  daily      methadone 10 MG/5ML solution Take 70 mg by mouth daily.      omeprazole (PRILOSEC) 20 MG delayed release capsule ceived the following from [facility]: Outside name: omeprazole (PRILOSEC) 20 mg capsule      pravastatin (PRAVACHOL) 10 MG tablet ceived the following from [facility]: Outside name: pravastatin (PRAVACHOL) 10 mg tablet             DISCONTINUED MEDICATIONS:  Current Discharge Medication List          PATIENT REFERRED TO:  Follow Up with:  Hiotellis, Collis FERNS, MD  [address]  [address]  [place] [address]  [phone]    In 1 day      _______________________________    Clinical Impression:   1. Constipation, unspecified constipation type    2. Acute midline low back pain with sciatica, sciatica laterality unspecified         Carlin Ni, MD using Dragon dictation      _______________________________     Ni Carlin FERNS, MD  03/04/24 1849    "

## 2024-03-04 NOTE — ED Triage Notes (Signed)
"  Arrives via EMS with c/o bilateral leg pain for 6 days, and not being able to have a bowel movement. Has hx of spinal CA. Pt states the stool feels stuck and has felt like that for several days       Active Ambulatory Problems     Diagnosis Date Noted    DJD (degenerative joint disease), cervical 12/31/2012    GAD (generalized anxiety disorder) 12/31/2012    Chronic pain syndrome 12/31/2012    Encounter for long-term (current) use of other medications 12/31/2012    GERD (gastroesophageal reflux disease) 02/24/2020    History of cervical spinal surgery 12/31/2012    HTN (hypertension) 02/24/2020    Neck pain 12/31/2012    Hep C w/o coma, chronic (HCC) 09/23/2020     Resolved Ambulatory Problems     Diagnosis Date Noted    Hepatitis C antibody test positive 02/24/2020     Past Medical History:   Diagnosis Date    Arthritis     Essential hypertension        "

## 2024-03-05 MED ORDER — OXYCODONE-ACETAMINOPHEN 5-325 MG PO TABS
5-325 | ORAL | Status: AC
Start: 2024-03-05 — End: 2024-03-04
  Administered 2024-03-05: 2 via ORAL

## 2024-03-05 MED FILL — OXYCODONE-ACETAMINOPHEN 5-325 MG PO TABS: 5-325 mg | ORAL | Qty: 2

## 2024-04-09 NOTE — Addendum Note (Signed)
 Encounter addended by: Oneita Allmon L on: 04/09/2024 10:11 AM  Actions taken: Imaging Exam ended

## 2024-04-09 NOTE — Addendum Note (Signed)
 Encounter addended by: Waynette Towers L on: 04/09/2024 10:12 AM  Actions taken: Imaging Exam ended

## 2024-04-26 ENCOUNTER — Inpatient Hospital Stay
Admit: 2024-04-26 | Discharge: 2024-04-27 | Disposition: A | Discharge status: Home / Self Care | Payer: Medicare (Managed Care) | Arrived: AM

## 2024-04-26 DIAGNOSIS — G893 Neoplasm related pain (acute) (chronic): Principal | ICD-10-CM

## 2024-04-26 MED ORDER — HYDROMORPHONE HCL PF 2 MG/ML IJ SOLN
2 | INTRAMUSCULAR | Status: AC
Start: 2024-04-26 — End: 2024-04-26
  Administered 2024-04-26: 19:00:00 1.5 mg via INTRAMUSCULAR

## 2024-04-26 MED FILL — HYDROMORPHONE HCL 2 MG/ML IJ SOLN: 2 mg/mL | INTRAMUSCULAR | Qty: 1 | Fill #0

## 2024-04-26 NOTE — ED Notes (Signed)
Report given to Clara RN. 

## 2024-04-26 NOTE — ED Provider Notes (Cosign Needed)
 EMERGENCY DEPARTMENT HISTORY & PHYSICAL EXAM    04/26/24, 1:49 PM EST    Clinical Impression:  1. Cancer-related pain    2. Pain in both lower extremities        Assessment/Differential Diagnosis:     Ddx bone pain, trauma, infection, spinal sacral cancer, hospice all considered    ED Course:  Initial assessment performed. The patients presenting problems have been discussed, and they are in agreement with the care plan formulated and outlined with them.  I have encouraged them to ask questions as they arise throughout their visit.    Patient comes the ED via EMS from home.  Patient has history of spinal cancer with sacral obstruction.  Patient is on methadone and hydrocodone for her pain.  She is followed by [facility] hospice.  Patient was brought to the ED today for bilateral leg pain that she could not control with her home medications.  Patient has been nonambulatory for several months.  No concern for trauma.  Patient denies any new symptoms.  She denies any fever, chills, rash.    Exam with adult female laying in stretcher.  Appears chronically ill.  Appears older than stated age.  Appears uncomfortable but nontoxic.  Vitals with no acute concern.  Heart regular rate and rhythm without murmur.  Lungs are clear to auscultation.  Abdomen is soft, nontender with good bowel sounds.  Examination of her lower extremities with left leg, hip externally rotated, flexed at the knee.  Right leg extended.  Patient does have movement and sensation, unable to do full exam as patient is uncooperative secondary to pain.  No obvious rash or signs of trauma.  Skin intact. Feet warm, good pulses.     Dilaudid  1.5mg  IM ordered. Pt prefers no IV if IM controls her pain. Pt does not feel workup (xrays/bldwork) needed.     4:03 PM EST  Discussed with Jori, RN, hospice. Agrees pt can be d/c home if pain controlled. She has meds at home to continue. Some noncompliance by pt was a concern, no  taking gabapentin.     Recheck with pt sleeping well. Easily aroused. Vitals stable. Pain under good control. Agreeable for discharge home. They are aware they are to continue home meds as prescribed, call hospice if any concerns.     7:54 PM EST  Pt signed out to [name], NP, awaiting transport home.  Medical Chart Review:  I have reviewed triage nursing documentation.      Disposition:  Home  in stable condition.      Chief Complaint   Patient presents with    Leg Pain     HPI:    The history is provided by patient. No language interpreter used.    Erica Zamora is a 64 y.o. female presenting to the Emergency Department with complaints of pain. Patient comes the ED via EMS from home.  Patient has history of spinal cancer with sacral obstruction.  Patient is on methadone and hydrocodone for her pain.  She is followed by [facility] hospice.  Patient was brought to the ED today for bilateral leg pain that she could not control with her home medications.  Patient has been nonambulatory for several months.  No concern for trauma.  Patient denies any new symptoms.  She denies any fever, chills, rash.      I have reviewed all PMHX, FMHX and Social Hx as entered into the medical record in the chart below using the Epic Template.  Review of Systems:  Constitutional: neg for fever, chills  ENT:  neg for URI symptoms  Respiratory:  neg for cough, shortness of breath  Cardiovascular:  neg for chest pain  GI:  neg for abdominal pain.  GU:  No Flank pain.  MSK: neg for trauma.   Neurological: neg for headaches  All other systems reviewed negative with exception of positives in ROS and HPI.    Past Medical History:  Past Medical History:   Diagnosis Date    Arthritis     Bone cancer [facility])     Essential hypertension        Past Surgical History:  Past Surgical History:   Procedure Laterality Date    CT NEEDLE BIOPSY SPINE  04/27/2023    CT NEEDLE BIOPSY SPINE 04/27/2023       Family History:  No family history on  file.    Social History:  Social History     Tobacco Use    Smoking status: Every Day     Current packs/day: 0.50     Types: Cigarettes    Smokeless tobacco: Never   Substance Use Topics    Alcohol use: Not Currently    Drug use: Not Currently       Allergies:  Allergies   Allergen Reactions    Fentanyl Itching, Other (See Comments) and Swelling     Burning    Tramadol Nausea Only       Vital Signs:  Vitals:    04/26/24 1349 04/26/24 1417 04/26/24 1419   BP: 110/69     Pulse: 81     Resp: 18     Temp:  98.3 F (36.8 C) 98.3 F (36.8 C)   TempSrc:  Oral Oral   SpO2: 100%     Weight: 63.5 kg (140 lb)       Physical Exam:  Vital Signs Reviewed. Nursing Notes Reviewed.  Constitutional:  chronically ill appearing, appears uncomfortable, nontoxic  Head: Normocephalic, Atraumatic  Eyes: Conjunctiva clear, lids normal. Sclera anicteric.   ZWU:yzjmpwh grossly intact  Neck:  supple, FROM  Lungs: No respiratory distress. Lungs CTAB   CV:  RR&R without murmur  Extremities:   lower extremities with left leg, hip externally rotated, flexed at the knee.  Right leg extended.  Patient does have movement and sensation, unable to do full exam as patient is uncooperative secondary to pain.  No obvious rash or signs of trauma.  Skin intact. Feet warm, good pulses.       Diagnostics:    Labs -   No results found for this or any previous visit (from the past 12 hours).    EKG: When ordered, EKG's are interpreted by the Emergency Department Provider in the absence of a cardiologist.  Please see their note for interpretation of EKG.      RADIOLOGY:  Non-plain film images such as CT, Ultrasound and MRI are read by the radiologist. Plain radiographic images are visualized and preliminarily interpreted by the ED.  Interpretation per the Radiologist below, if available at the time of this note:  No orders to display       Medications given in the ED-  Medications   HYDROmorphone  HCl PF (DILAUDID ) injection 1.5 mg (1.5 mg IntraMUSCular Given  04/26/24 1414)       Please note that this dictation was completed with Dragon, the computer voice recognition software.  Quite often unanticipated grammatical, syntax, homophones, and other interpretive errors are inadvertently transcribed by the  production manager.  Please disregard these errors.  Please excuse any errors that have escaped final proofreading.       Augustus Olam LABOR, PA-C  04/26/24 1609       Augustus Olam LABOR, PA-C  04/26/24 1647       Augustus Olam LABOR, PA-C  04/26/24 1954

## 2024-04-26 NOTE — ED Triage Notes (Signed)
 Pt brought into er via ems from home for complaints of bilateral leg pain. Pt reports stage 4 Bone cancer and is currently on hospice. Pt reports unable to control pain at home was sent by hospice to ER.   Pt utilizes [employer].

## 2024-04-26 NOTE — Discharge Instructions (Signed)
 Is very important you stick to your medication regimen at home including the gabapentin.  Is importantly have assistance with your medications and timing from a family member.  Call hospice if you have any concerns day or night.

## 2024-04-26 NOTE — ED Notes (Signed)
 Daughter made aware that patient will be picked up @ 2230. Daughter verbalizes understanding and request call when pt is on her way.

## 2024-04-27 MED ORDER — LIDOCAINE 4 % EX PTCH
4 | CUTANEOUS | Status: DC
Start: 2024-04-27 — End: 2024-04-27
  Administered 2024-04-27: 05:00:00 1 via TRANSDERMAL

## 2024-04-27 MED ORDER — HYDROMORPHONE HCL 2 MG PO TABS
2 | ORAL | Status: DC
Start: 2024-04-27 — End: 2024-04-26

## 2024-04-27 MED ORDER — HYDROMORPHONE HCL 2 MG PO TABS
2 | ORAL | Status: AC
Start: 2024-04-27 — End: 2024-04-26
  Administered 2024-04-27: 05:00:00 4 mg via ORAL

## 2024-04-27 MED FILL — LIDOCAINE PAIN RELIEF MAX ST 4 % EX PTCH: 4 % | CUTANEOUS | Qty: 2 | Fill #0

## 2024-04-27 MED FILL — HYDROMORPHONE HCL 2 MG PO TABS: 2 mg | ORAL | Qty: 2 | Fill #0
